# Patient Record
Sex: Female | Born: 1935 | Race: White | Hispanic: No | Smoking: Former smoker
Health system: Southern US, Community
[De-identification: ages and names within clinical notes are randomized; demographics above are authoritative.]

## PROBLEM LIST (undated history)

## (undated) DIAGNOSIS — I639 Cerebral infarction, unspecified: Secondary | ICD-10-CM

## (undated) DIAGNOSIS — C50919 Malignant neoplasm of unspecified site of unspecified female breast: Secondary | ICD-10-CM

## (undated) DIAGNOSIS — Z950 Presence of cardiac pacemaker: Secondary | ICD-10-CM

## (undated) DIAGNOSIS — E119 Type 2 diabetes mellitus without complications: Secondary | ICD-10-CM

## (undated) DIAGNOSIS — M810 Age-related osteoporosis without current pathological fracture: Secondary | ICD-10-CM

## (undated) DIAGNOSIS — E7800 Pure hypercholesterolemia, unspecified: Secondary | ICD-10-CM

## (undated) DIAGNOSIS — I10 Essential (primary) hypertension: Secondary | ICD-10-CM

## (undated) DIAGNOSIS — G459 Transient cerebral ischemic attack, unspecified: Secondary | ICD-10-CM

## (undated) DIAGNOSIS — M1990 Unspecified osteoarthritis, unspecified site: Secondary | ICD-10-CM

## (undated) DIAGNOSIS — J449 Chronic obstructive pulmonary disease, unspecified: Secondary | ICD-10-CM

## (undated) DIAGNOSIS — J479 Bronchiectasis, uncomplicated: Secondary | ICD-10-CM

## (undated) DIAGNOSIS — I890 Lymphedema, not elsewhere classified: Secondary | ICD-10-CM

## (undated) DIAGNOSIS — K2270 Barrett's esophagus without dysplasia: Secondary | ICD-10-CM

## (undated) DIAGNOSIS — J45909 Unspecified asthma, uncomplicated: Secondary | ICD-10-CM

## (undated) HISTORY — DX: Type 2 diabetes mellitus without complications: E11.9

## (undated) HISTORY — DX: Unspecified asthma, uncomplicated: J45.909

## (undated) HISTORY — DX: Essential (primary) hypertension: I10

## (undated) HISTORY — DX: Transient cerebral ischemic attack, unspecified: G45.9

## (undated) HISTORY — PX: GALLBLADDER SURGERY: SHX652

## (undated) HISTORY — PX: CATARACT EXTRACTION: SUR2

## (undated) HISTORY — DX: Cerebral infarction, unspecified: I63.9

## (undated) HISTORY — PX: CARPAL TUNNEL RELEASE: SHX101

## (undated) HISTORY — PX: TRIGGER FINGER RELEASE: SHX641

## (undated) HISTORY — DX: Lymphedema, not elsewhere classified: I89.0

## (undated) HISTORY — DX: Presence of cardiac pacemaker: Z95.0

---

## 1997-06-24 DIAGNOSIS — C50919 Malignant neoplasm of unspecified site of unspecified female breast: Secondary | ICD-10-CM

## 1997-06-24 HISTORY — DX: Malignant neoplasm of unspecified site of unspecified female breast: C50.919

## 1997-06-24 HISTORY — PX: MASTECTOMY: SHX3

## 1999-06-25 HISTORY — PX: TOTAL HIP ARTHROPLASTY: SHX124

## 2004-03-24 ENCOUNTER — Ambulatory Visit: Payer: Self-pay | Admitting: Oncology

## 2004-08-31 ENCOUNTER — Ambulatory Visit: Payer: Self-pay | Admitting: Oncology

## 2004-09-22 ENCOUNTER — Ambulatory Visit: Payer: Self-pay | Admitting: Oncology

## 2004-11-16 ENCOUNTER — Ambulatory Visit: Payer: Self-pay | Admitting: Internal Medicine

## 2005-02-18 ENCOUNTER — Ambulatory Visit: Payer: Self-pay | Admitting: Oncology

## 2005-02-27 ENCOUNTER — Ambulatory Visit: Payer: Self-pay | Admitting: Oncology

## 2005-03-24 ENCOUNTER — Ambulatory Visit: Payer: Self-pay | Admitting: Oncology

## 2005-05-26 ENCOUNTER — Inpatient Hospital Stay: Payer: Self-pay | Admitting: Internal Medicine

## 2005-06-07 ENCOUNTER — Inpatient Hospital Stay: Payer: Self-pay | Admitting: Internal Medicine

## 2005-06-09 ENCOUNTER — Other Ambulatory Visit: Payer: Self-pay

## 2005-07-31 ENCOUNTER — Encounter: Payer: Self-pay | Admitting: Internal Medicine

## 2005-08-22 ENCOUNTER — Encounter: Payer: Self-pay | Admitting: Internal Medicine

## 2005-08-26 ENCOUNTER — Ambulatory Visit: Payer: Self-pay | Admitting: Oncology

## 2005-09-22 ENCOUNTER — Encounter: Payer: Self-pay | Admitting: Internal Medicine

## 2005-09-22 ENCOUNTER — Ambulatory Visit: Payer: Self-pay | Admitting: Oncology

## 2005-10-22 ENCOUNTER — Encounter: Payer: Self-pay | Admitting: Internal Medicine

## 2005-11-22 ENCOUNTER — Encounter: Payer: Self-pay | Admitting: Internal Medicine

## 2005-12-22 ENCOUNTER — Encounter: Payer: Self-pay | Admitting: Internal Medicine

## 2006-02-19 ENCOUNTER — Ambulatory Visit: Payer: Self-pay | Admitting: Oncology

## 2006-03-03 ENCOUNTER — Ambulatory Visit: Payer: Self-pay | Admitting: Oncology

## 2006-03-24 ENCOUNTER — Ambulatory Visit: Payer: Self-pay | Admitting: Oncology

## 2006-03-25 ENCOUNTER — Ambulatory Visit: Payer: Self-pay | Admitting: Internal Medicine

## 2006-08-11 ENCOUNTER — Ambulatory Visit: Payer: Self-pay | Admitting: Internal Medicine

## 2006-08-23 ENCOUNTER — Ambulatory Visit: Payer: Self-pay | Admitting: Internal Medicine

## 2006-09-05 ENCOUNTER — Ambulatory Visit: Payer: Self-pay | Admitting: Oncology

## 2006-09-23 ENCOUNTER — Ambulatory Visit: Payer: Self-pay | Admitting: Oncology

## 2006-10-23 ENCOUNTER — Ambulatory Visit: Payer: Self-pay | Admitting: Oncology

## 2006-10-27 ENCOUNTER — Ambulatory Visit: Payer: Self-pay | Admitting: General Surgery

## 2006-10-27 ENCOUNTER — Other Ambulatory Visit: Payer: Self-pay

## 2006-10-29 ENCOUNTER — Ambulatory Visit: Payer: Self-pay | Admitting: General Surgery

## 2006-11-22 ENCOUNTER — Inpatient Hospital Stay: Payer: Self-pay | Admitting: Internal Medicine

## 2006-11-22 ENCOUNTER — Other Ambulatory Visit: Payer: Self-pay

## 2007-02-23 ENCOUNTER — Ambulatory Visit: Payer: Self-pay | Admitting: Oncology

## 2007-02-25 ENCOUNTER — Ambulatory Visit: Payer: Self-pay | Admitting: Oncology

## 2007-03-02 ENCOUNTER — Ambulatory Visit: Payer: Self-pay | Admitting: Oncology

## 2007-03-09 ENCOUNTER — Ambulatory Visit: Payer: Self-pay | Admitting: Cardiology

## 2007-03-25 ENCOUNTER — Ambulatory Visit: Payer: Self-pay | Admitting: Oncology

## 2007-07-27 ENCOUNTER — Ambulatory Visit: Payer: Self-pay | Admitting: Internal Medicine

## 2007-09-24 ENCOUNTER — Ambulatory Visit: Payer: Self-pay | Admitting: Oncology

## 2007-10-19 ENCOUNTER — Ambulatory Visit: Payer: Self-pay | Admitting: Unknown Physician Specialty

## 2007-10-19 ENCOUNTER — Other Ambulatory Visit: Payer: Self-pay

## 2007-10-21 ENCOUNTER — Ambulatory Visit: Payer: Self-pay | Admitting: Unknown Physician Specialty

## 2007-10-23 ENCOUNTER — Ambulatory Visit: Payer: Self-pay | Admitting: Oncology

## 2007-11-23 ENCOUNTER — Ambulatory Visit: Payer: Self-pay | Admitting: Oncology

## 2007-11-25 ENCOUNTER — Ambulatory Visit: Payer: Self-pay | Admitting: Oncology

## 2007-12-14 ENCOUNTER — Encounter: Payer: Self-pay | Admitting: Oncology

## 2007-12-23 ENCOUNTER — Ambulatory Visit: Payer: Self-pay | Admitting: Oncology

## 2007-12-23 ENCOUNTER — Encounter: Payer: Self-pay | Admitting: Oncology

## 2008-01-23 ENCOUNTER — Encounter: Payer: Self-pay | Admitting: Oncology

## 2008-01-23 ENCOUNTER — Ambulatory Visit: Payer: Self-pay | Admitting: Oncology

## 2008-02-08 ENCOUNTER — Ambulatory Visit: Payer: Self-pay | Admitting: Pain Medicine

## 2008-03-05 ENCOUNTER — Emergency Department: Payer: Self-pay | Admitting: Emergency Medicine

## 2008-03-05 ENCOUNTER — Other Ambulatory Visit: Payer: Self-pay

## 2008-03-07 ENCOUNTER — Ambulatory Visit: Payer: Self-pay | Admitting: Oncology

## 2008-03-24 ENCOUNTER — Ambulatory Visit: Payer: Self-pay | Admitting: Oncology

## 2008-03-25 ENCOUNTER — Ambulatory Visit: Payer: Self-pay | Admitting: Urology

## 2008-04-24 ENCOUNTER — Ambulatory Visit: Payer: Self-pay | Admitting: Oncology

## 2008-09-20 ENCOUNTER — Ambulatory Visit: Payer: Self-pay | Admitting: Unknown Physician Specialty

## 2008-09-22 ENCOUNTER — Ambulatory Visit: Payer: Self-pay | Admitting: Oncology

## 2008-09-27 ENCOUNTER — Ambulatory Visit: Payer: Self-pay | Admitting: General Practice

## 2008-10-05 ENCOUNTER — Ambulatory Visit: Payer: Self-pay | Admitting: Oncology

## 2008-10-14 ENCOUNTER — Encounter: Payer: Self-pay | Admitting: General Practice

## 2008-10-22 ENCOUNTER — Encounter: Payer: Self-pay | Admitting: General Practice

## 2008-10-22 ENCOUNTER — Ambulatory Visit: Payer: Self-pay | Admitting: Oncology

## 2008-11-22 ENCOUNTER — Encounter: Payer: Self-pay | Admitting: General Practice

## 2009-03-08 ENCOUNTER — Ambulatory Visit: Payer: Self-pay | Admitting: Oncology

## 2009-03-24 ENCOUNTER — Ambulatory Visit: Payer: Self-pay | Admitting: Oncology

## 2009-04-05 ENCOUNTER — Ambulatory Visit: Payer: Self-pay | Admitting: Oncology

## 2009-04-24 ENCOUNTER — Ambulatory Visit: Payer: Self-pay | Admitting: Oncology

## 2009-05-24 ENCOUNTER — Ambulatory Visit: Payer: Self-pay | Admitting: Oncology

## 2009-06-13 ENCOUNTER — Ambulatory Visit: Payer: Self-pay | Admitting: Internal Medicine

## 2009-11-22 ENCOUNTER — Ambulatory Visit: Payer: Self-pay | Admitting: Oncology

## 2009-12-15 ENCOUNTER — Ambulatory Visit: Payer: Self-pay | Admitting: Oncology

## 2009-12-22 ENCOUNTER — Ambulatory Visit: Payer: Self-pay | Admitting: Oncology

## 2010-01-28 ENCOUNTER — Emergency Department: Payer: Self-pay | Admitting: Emergency Medicine

## 2010-02-19 ENCOUNTER — Encounter: Payer: Self-pay | Admitting: Oncology

## 2010-02-28 ENCOUNTER — Encounter: Payer: Self-pay | Admitting: Oncology

## 2010-03-14 ENCOUNTER — Ambulatory Visit: Payer: Self-pay | Admitting: Oncology

## 2010-03-24 ENCOUNTER — Encounter: Payer: Self-pay | Admitting: Oncology

## 2010-04-10 ENCOUNTER — Ambulatory Visit: Payer: Self-pay | Admitting: Unknown Physician Specialty

## 2010-06-29 ENCOUNTER — Ambulatory Visit: Payer: Self-pay | Admitting: Oncology

## 2010-07-07 LAB — CANCER ANTIGEN 27.29: CA 27.29: 20.8 U/mL (ref 0.0–38.6)

## 2010-07-25 ENCOUNTER — Ambulatory Visit: Payer: Self-pay | Admitting: Oncology

## 2010-08-29 ENCOUNTER — Encounter: Payer: Self-pay | Admitting: Internal Medicine

## 2010-09-23 ENCOUNTER — Encounter: Payer: Self-pay | Admitting: Internal Medicine

## 2011-01-03 ENCOUNTER — Ambulatory Visit: Payer: Self-pay

## 2011-01-30 ENCOUNTER — Encounter: Payer: Self-pay | Admitting: Cardiothoracic Surgery

## 2011-01-30 ENCOUNTER — Encounter: Payer: Self-pay | Admitting: Nurse Practitioner

## 2011-03-18 ENCOUNTER — Ambulatory Visit: Payer: Self-pay | Admitting: Oncology

## 2011-06-07 ENCOUNTER — Ambulatory Visit: Payer: Self-pay | Admitting: Internal Medicine

## 2011-06-25 ENCOUNTER — Ambulatory Visit: Payer: Self-pay | Admitting: Internal Medicine

## 2011-07-01 ENCOUNTER — Ambulatory Visit: Payer: Self-pay | Admitting: Oncology

## 2011-07-01 LAB — COMPREHENSIVE METABOLIC PANEL
Alkaline Phosphatase: 160 U/L — ABNORMAL HIGH (ref 50–136)
Calcium, Total: 9.5 mg/dL (ref 8.5–10.1)
Chloride: 104 mmol/L (ref 98–107)
Co2: 30 mmol/L (ref 21–32)
Creatinine: 1.23 mg/dL (ref 0.60–1.30)
EGFR (African American): 55 — ABNORMAL LOW
EGFR (Non-African Amer.): 45 — ABNORMAL LOW
SGOT(AST): 23 U/L (ref 15–37)
SGPT (ALT): 29 U/L

## 2011-07-01 LAB — CBC CANCER CENTER
Basophil #: 0 x10 3/mm (ref 0.0–0.1)
Eosinophil #: 0 x10 3/mm (ref 0.0–0.7)
Eosinophil %: 1.6 %
Eosinophil: 2 %
HGB: 14.6 g/dL (ref 12.0–16.0)
Lymphocyte #: 0.5 x10 3/mm — ABNORMAL LOW (ref 1.0–3.6)
Lymphocytes: 18 %
MCHC: 34.2 g/dL (ref 32.0–36.0)
Monocyte #: 0.6 x10 3/mm (ref 0.0–0.7)
Monocytes: 20 %
Neutrophil %: 51 %
Platelet: 170 x10 3/mm (ref 150–440)
RBC: 4.96 10*6/uL (ref 3.80–5.20)
RDW: 13.9 % (ref 11.5–14.5)
Segmented Neutrophils: 60 %
WBC: 2.5 x10 3/mm — ABNORMAL LOW (ref 3.6–11.0)

## 2011-07-09 ENCOUNTER — Encounter: Payer: Self-pay | Admitting: Oncology

## 2011-07-15 LAB — CBC CANCER CENTER
Basophil #: 0 x10 3/mm (ref 0.0–0.1)
Basophil %: 0.2 %
Eosinophil #: 0.1 x10 3/mm (ref 0.0–0.7)
Eosinophil %: 2.7 %
HCT: 37.5 % (ref 35.0–47.0)
Lymphocyte #: 1.1 x10 3/mm (ref 1.0–3.6)
Lymphocyte %: 21.1 %
MCHC: 34.3 g/dL (ref 32.0–36.0)
MCV: 85 fL (ref 80–100)
Monocyte %: 8.6 %
Neutrophil #: 3.6 x10 3/mm (ref 1.4–6.5)
Platelet: 348 x10 3/mm (ref 150–440)
RDW: 13.6 % (ref 11.5–14.5)
WBC: 5.3 x10 3/mm (ref 3.6–11.0)

## 2011-07-26 ENCOUNTER — Encounter: Payer: Self-pay | Admitting: Oncology

## 2011-07-26 ENCOUNTER — Ambulatory Visit: Payer: Self-pay | Admitting: Oncology

## 2011-11-08 ENCOUNTER — Emergency Department: Payer: Self-pay | Admitting: Emergency Medicine

## 2011-11-08 LAB — COMPREHENSIVE METABOLIC PANEL
Albumin: 4 g/dL (ref 3.4–5.0)
Alkaline Phosphatase: 119 U/L (ref 50–136)
Anion Gap: 10 (ref 7–16)
BUN: 32 mg/dL — ABNORMAL HIGH (ref 7–18)
Bilirubin,Total: 0.5 mg/dL (ref 0.2–1.0)
Calcium, Total: 9.1 mg/dL (ref 8.5–10.1)
Chloride: 104 mmol/L (ref 98–107)
Creatinine: 1.23 mg/dL (ref 0.60–1.30)
EGFR (African American): 49 — ABNORMAL LOW
EGFR (Non-African Amer.): 43 — ABNORMAL LOW
Glucose: 117 mg/dL — ABNORMAL HIGH (ref 65–99)
Osmolality: 291 (ref 275–301)
SGOT(AST): 84 U/L — ABNORMAL HIGH (ref 15–37)
Sodium: 142 mmol/L (ref 136–145)
Total Protein: 7.6 g/dL (ref 6.4–8.2)

## 2011-11-08 LAB — URINALYSIS, COMPLETE
Bacteria: NONE SEEN
Bilirubin,UR: NEGATIVE
Glucose,UR: NEGATIVE mg/dL (ref 0–75)
Hyaline Cast: 4
Nitrite: NEGATIVE
Ph: 5 (ref 4.5–8.0)
RBC,UR: 1 /HPF (ref 0–5)
Specific Gravity: 1.014 (ref 1.003–1.030)
Transitional Epi: 2

## 2011-11-08 LAB — CBC
HCT: 42 % (ref 35.0–47.0)
HGB: 13.7 g/dL (ref 12.0–16.0)
MCV: 90 fL (ref 80–100)
WBC: 6.7 10*3/uL (ref 3.6–11.0)

## 2011-11-08 LAB — LIPASE, BLOOD: Lipase: 410 U/L — ABNORMAL HIGH (ref 73–393)

## 2011-11-09 LAB — TROPONIN I
Troponin-I: <0.02 ng/mL
Troponin-I: <0.02 ng/mL

## 2011-11-25 ENCOUNTER — Institutional Professional Consult (permissible substitution): Payer: Self-pay | Admitting: Pulmonary Disease

## 2011-12-16 ENCOUNTER — Ambulatory Visit: Payer: Self-pay | Admitting: Internal Medicine

## 2011-12-27 ENCOUNTER — Other Ambulatory Visit: Payer: Self-pay | Admitting: Physician Assistant

## 2011-12-27 DIAGNOSIS — M542 Cervicalgia: Secondary | ICD-10-CM

## 2011-12-31 ENCOUNTER — Encounter: Payer: Self-pay | Admitting: Pulmonary Disease

## 2011-12-31 ENCOUNTER — Ambulatory Visit (INDEPENDENT_AMBULATORY_CARE_PROVIDER_SITE_OTHER): Payer: Medicare Other | Admitting: Pulmonary Disease

## 2011-12-31 VITALS — BP 126/74 | HR 83 | Temp 98.2°F | Ht 60.0 in | Wt 293.0 lb

## 2011-12-31 DIAGNOSIS — K219 Gastro-esophageal reflux disease without esophagitis: Secondary | ICD-10-CM

## 2011-12-31 DIAGNOSIS — E119 Type 2 diabetes mellitus without complications: Secondary | ICD-10-CM

## 2011-12-31 DIAGNOSIS — J411 Mucopurulent chronic bronchitis: Secondary | ICD-10-CM

## 2011-12-31 DIAGNOSIS — R059 Cough, unspecified: Secondary | ICD-10-CM

## 2011-12-31 DIAGNOSIS — R05 Cough: Secondary | ICD-10-CM | POA: Insufficient documentation

## 2011-12-31 DIAGNOSIS — J449 Chronic obstructive pulmonary disease, unspecified: Secondary | ICD-10-CM

## 2011-12-31 DIAGNOSIS — J438 Other emphysema: Secondary | ICD-10-CM

## 2011-12-31 DIAGNOSIS — J4489 Other specified chronic obstructive pulmonary disease: Secondary | ICD-10-CM

## 2011-12-31 DIAGNOSIS — J439 Emphysema, unspecified: Secondary | ICD-10-CM

## 2011-12-31 DIAGNOSIS — I10 Essential (primary) hypertension: Secondary | ICD-10-CM

## 2011-12-31 NOTE — Assessment & Plan Note (Addendum)
Mrs. Stachnik does not have COPD based on simple spirometry performed today in clinic. She clearly has chronic bronchitis however as she has had mucopurulent sputum production for the last several months. It also sounds as if this has been a recurrent problem for her over the years as she has had multiple bouts of bronchitis and pneumonia. This is undoubtedly related to her smoking in the past.  She states that she had pertussis as a child which can lead to bronchiectasis. I question whether or not she could have this based on her description of copious sputum production on a daily basis. In order to make this diagnosis we would need to perform a CT scan. She would prefer to defer this test for now.  She is on maximum therapy for chronic bronchitis yet still has symptoms. In fact we could probably stop some of her inhalers. I believe that her cough is made worse by chronic sinus congestion and acid reflux so hopefully treating this will help.  Plan: -She is to continue taking her inhalers as written -I will obtain records from doctors [place] and [facility] -I have given her instructions for treatment of cough see that below -consider CT chest to evaluate for bronchiectasis

## 2011-12-31 NOTE — Patient Instructions (Addendum)
We will see you back in 3 months or sooner if needed. We will get records from Dr. Lillette Boxer office. Stop Flovent Continue taking Advair, Spiriva as written.  Use Neil Med rinses with distilled water at least twice per day using the instructions on the package. 1/2 hour after using the [facility] Med rinse, use Nasonex two puffs in each nostril once per day. Use chlortrimeton and an over the counter decongestant (ask the pharmacist for a recommendation) as needed for the cough.  You can also use delsym as needed for cough.  Follow the reflux lifestyle changes we recommended.  Increase prilosec to 40mg  twice a day.

## 2011-12-31 NOTE — Assessment & Plan Note (Signed)
She has symptoms consistent with sinus congestion and acid reflux which are likely contributing to her cough.  Plan: -Start Lloyd Huger med rinses over-the-counter Chlor-Trimeton, and over-the-counter decongestants -Double dose of PPI therapy -Follow gastric reflux lifestyle changes

## 2011-12-31 NOTE — Assessment & Plan Note (Signed)
-  Increase PPI to 40 mg twice a day -Follow GERD lifestyle modification

## 2011-12-31 NOTE — Progress Notes (Signed)
Subjective:    Patient ID: Teresa Krueger, female    DOB: October 14, 1933, 78 y.o.   MRN: [id number]  HPI  12/31/11 First office visit: Teresa Krueger is a very pleasant 78 year old female who is referred to [place] for evaluation of chronic bronchitis and cough. She was healthy as a young child though she states she had pertussis at some point in childhood. However as a child and young adult she had no respiratory symptoms. She has had 3-4 episodes of pneumonia and episodes of bronchitis over the years. For the last 10 years she has been treated with inhaler therapy but she does not know why. It least 10 years ago she was referred to Dr. Priscille Heidelberg and was told that she had asthma. She states that she does not have frequent bouts of wheezing chest pain or chest tightness with shortness of breath. Her primary symptoms over the last several years have been dyspnea on exertion. She states that she gets short of breath with minimal activity including walking from one room to the next or showering. She does not exercise on a regular basis do to her weight and arthritis. She has not had a chronic cough until January of 2013. At that point she developed pneumonia and has had a cough productive of purulent sputum since. A chest x-ray performed after several weeks of antibiotics was normal. Despite this the sputum production has persisted. It is often yellow or even green. She denies weight loss or ongoing fevers. Her shortness of breath has not changed and she does not wheeze. She states that the cough is not worse when she lies flat or when she wakes up in the morning. She does think that the cough may be worse after she eat dinner in the evenings. She denies gastroesophageal reflux disease symptoms however she has been told that she has this in the past. She states that she has chronic sinus congestion but does not think that she has a postnasal drip. She takes Spiriva Advair and albuterol but does not believe that any of  these help with her symptoms.  . Past Medical History  Diagnosis Date  . Mini stroke   . Asthma   . Diabetes mellitus   . Hypertension   . Breast cancer, left   . Lymphedema      Family History  Problem Relation Age of Onset  . Heart disease Father   . Heart disease Mother   . Heart disease Maternal Grandmother   . Heart disease Paternal Grandmother   . Stroke Maternal Aunt   . Breast cancer Paternal Aunt      History   Social History  . Marital Status: Married    Spouse Name: N/A    Number of Children: 1  . Years of Education: N/A   Occupational History  . 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 Smoking status: Former Smoker -- 1.0 packs/day for 30 years    Types: Cigarettes    Quit date: 12/27/1992  . Smokeless tobacco: Never Used  . Alcohol Use: No  . Drug Use: No  . Sexually Active: Not on file   Other Topics Concern  . Not on file   Social History Narrative  . No narrative on file     Allergies  Allergen Reactions  . Celebrex (Celecoxib) Anaphylaxis  . Nsaids Anaphylaxis  . Rofecoxib Anaphylaxis  . Codeine Nausea And Vomiting  syncope  . Hydrocodone     unknown  . Morphine And Related     unknown  . Penicillins Swelling  . Phenergan (Promethazine Hcl)     unknown  . Propoxyphene And Methadone     unknown  . Quinolones     unknown  . Sulfa Antibiotics Itching     No outpatient prescriptions prior to visit.    Review of Systems  Constitutional: Negative for fever, chills and unexpected weight change.  HENT: Positive for congestion and voice change. Negative for ear pain, nosebleeds, sore throat, rhinorrhea, sneezing, trouble swallowing, dental problem, postnasal drip and sinus pressure.   Eyes: Negative for visual disturbance.  Respiratory: Positive for cough and shortness of breath. Negative for choking.   Cardiovascular: Positive for leg swelling. Negative for chest pain.  Gastrointestinal:  Negative for vomiting, abdominal pain and diarrhea.  Genitourinary: Negative for difficulty urinating.  Musculoskeletal: Positive for arthralgias.  Skin: Negative for rash.  Neurological: Negative for tremors, syncope and headaches.  Hematological: Does not bruise/bleed easily.       Objective:   Physical Exam Filed Vitals:   12/31/11 1513  BP: 126/74  Pulse: 83  Temp: 98.2 F (36.8 C)  TempSrc: Oral  Height: 5' (1.524 m)  Weight: 293 lb (132.904 kg)  SpO2: 95%   Gen: morbidly obese, wheelchair bound, no acute distress HEENT: NCAT, PERRL, EOMi, OP clear, neck supple without masses PULM: CTA B CV: RRR, systolic murmur RUSB, no JVD AB: BS+, soft, nontender, no hsm Ext: warm, significant non-pitting edema bilaterally, no clubbing, no cyanosis Derm: no rash or skin breakdown Neuro: A&Ox4, CN II-XII intact, strength 5/5 in all 4 extremities  12/31/2011 Simple spirometry normal 07/27/2011 CXR question emphysema/hyperinflation     Assessment & Plan:   Chronic bronchitis Mrs. Abeln does not have COPD based on simple spirometry performed today in clinic. She clearly has chronic bronchitis however as she has had mucopurulent sputum production for the last several months. It also sounds as if this has been a recurrent problem for her over the years as she has had multiple bouts of bronchitis and pneumonia. This is undoubtedly related to her smoking in the past.  She states that she had pertussis as a child which can lead to bronchiectasis. I question whether or not she could have this based on her description of copious sputum production on a daily basis. In order to make this diagnosis we would need to perform a CT scan. She would prefer to defer this test for now.  She is on maximum therapy for chronic bronchitis yet still has symptoms. In fact we could probably stop some of her inhalers. I believe that her cough is made worse by chronic sinus congestion and acid reflux so hopefully  treating this will help.  Plan: -She is to continue taking her inhalers as written -I will obtain records from doctors [facility] and [facility] -I have given her instructions for treatment of cough see that below  Cough She has symptoms consistent with sinus congestion and acid reflux which are likely contributing to her cough.  Plan: -Start Lloyd Huger med rinses over-the-counter Chlor-Trimeton, and over-the-counter decongestants -Double dose of PPI therapy -Follow gastric reflux lifestyle changes  GERD (gastroesophageal reflux disease) -Increase PPI to 40 mg twice a day -Follow GERD lifestyle modification   Updated Medication List Outpatient Encounter Prescriptions as of 12/31/2011  Medication Sig Dispense Refill  . acetaminophen (TYLENOL) 325 MG tablet Take 650 mg by mouth every 6 (six) hours  as needed.      Marland Kitchen albuterol (PROAIR HFA) 108 (90 BASE) MCG/ACT inhaler Inhale 2 puffs into the lungs every 6 (six) hours as needed.      . ALPRAZolam (XANAX) 0.5 MG tablet Take 0.5 mg by mouth 2 (two) times daily as needed.      . bumetanide (BUMEX) 1 MG tablet Take 1 mg by mouth daily.      . calcium-vitamin D (OSCAL WITH D) 500-200 MG-UNIT per tablet Take 1 tablet by mouth daily.      . fluticasone (FLOVENT HFA) 220 MCG/ACT inhaler Inhale 1 puff into the lungs daily.      . Fluticasone-Salmeterol (ADVAIR) 250-50 MCG/DOSE AEPB Inhale 1 puff into the lungs every 12 (twelve) hours.      . insulin lispro protamine-insulin lispro (HUMALOG 75/25) (75-25) 100 UNIT/ML SUSP Take as directed      . losartan (COZAAR) 25 MG tablet Take 25 mg by mouth daily.      . Multiple Vitamin (MULTIVITAMIN) capsule Take 1 capsule by mouth daily.      Marland Kitchen omeprazole (PRILOSEC) 20 MG capsule Take 20 mg by mouth 2 (two) times daily.      . simethicone (MYLICON) 125 MG chewable tablet Chew 125 mg by mouth every 6 (six) hours as needed.      . simvastatin (ZOCOR) 20 MG tablet Take 20 mg by mouth every evening.      Marland Kitchen spironolactone  (ALDACTONE) 25 MG tablet Take 25 mg by mouth daily.

## 2012-01-03 ENCOUNTER — Encounter: Payer: Self-pay | Admitting: *Deleted

## 2012-01-03 ENCOUNTER — Telehealth: Payer: Self-pay | Admitting: Pulmonary Disease

## 2012-01-03 NOTE — Telephone Encounter (Signed)
noted 

## 2012-01-03 NOTE — Telephone Encounter (Signed)
Will forward to Dr. Kendrick Fries as an [place]ain Childes and this has been added to pt family history.

## 2012-01-06 ENCOUNTER — Ambulatory Visit
Admission: RE | Admit: 2012-01-06 | Discharge: 2012-01-06 | Disposition: A | Discharge status: Home / Self Care | Payer: Medicare Other | Source: Ambulatory Visit | Attending: Physician Assistant | Admitting: Physician Assistant

## 2012-01-06 DIAGNOSIS — M542 Cervicalgia: Secondary | ICD-10-CM

## 2012-01-20 ENCOUNTER — Telehealth: Payer: Self-pay | Admitting: Pulmonary Disease

## 2012-01-20 MED ORDER — OMEPRAZOLE 20 MG PO CPDR: 20.0000 mg | DELAYED_RELEASE_CAPSULE | Freq: Two times a day (BID) | ORAL | Status: DC

## 2012-01-20 NOTE — Telephone Encounter (Signed)
Rx for omeprazole was refilled. Pt aware and states nothing further needed.

## 2012-02-04 ENCOUNTER — Telehealth: Payer: Self-pay | Admitting: Pulmonary Disease

## 2012-02-04 NOTE — Telephone Encounter (Signed)
Caller: Adra/Patient; Patient Name: Teresa Krueger; PCP: Duncan Dull; Best Callback Phone Number: [phone] [name] is calling about having Gas pains- upper stomach hurts.  She has frequent dry cough, chest feels tight- chronic Bronchits and takes Delsym and thinks it is bothering her stomach.  Mouth feels dry. Abdomen bloated and feeling nauseous. Appetite Diminished. Blood Glucose= this morning.  It has been running higher even though not eating as much. Last bm was today loose d/t taking MOM last night. Awaiting test result to see if blood in stool. Triage per Diabetes:Gastrointestinal Problems Protocol and appnt advised within 24 hours for "Generalized bloating, distension or swelling". Appnt scheduled with Dr. Darrick Huntsman on 02/05/12 @ 1615.

## 2012-02-05 ENCOUNTER — Ambulatory Visit: Payer: Medicare Other | Admitting: Internal Medicine

## 2012-02-10 ENCOUNTER — Ambulatory Visit: Payer: Self-pay | Admitting: Unknown Physician Specialty

## 2012-02-11 ENCOUNTER — Telehealth: Payer: Self-pay | Admitting: Pulmonary Disease

## 2012-02-11 NOTE — Telephone Encounter (Signed)
Spoke with pt and advised will leave samples up front for pick up at the [place] office on Monday 02/17/12. Ensured pt has enough meds to last until then. Pt states nothing further needed.  

## 2012-02-12 LAB — PATHOLOGY REPORT

## 2012-03-18 ENCOUNTER — Ambulatory Visit: Payer: Self-pay | Admitting: Internal Medicine

## 2012-03-18 ENCOUNTER — Ambulatory Visit: Payer: Self-pay | Admitting: Oncology

## 2012-03-18 LAB — CBC CANCER CENTER
Basophil #: 0 x10 3/mm (ref 0.0–0.1)
Basophil %: 0.7 %
Eosinophil #: 0.2 x10 3/mm (ref 0.0–0.7)
HCT: 43.1 % (ref 35.0–47.0)
HGB: 13.9 g/dL (ref 12.0–16.0)
Lymphocyte #: 1.2 x10 3/mm (ref 1.0–3.6)
Lymphocyte %: 22.8 %
MCH: 28.6 pg (ref 26.0–34.0)
MCHC: 32.3 g/dL (ref 32.0–36.0)
Monocyte #: 0.5 x10 3/mm (ref 0.2–0.9)
Neutrophil #: 3.3 x10 3/mm (ref 1.4–6.5)
Platelet: 192 x10 3/mm (ref 150–440)
RBC: 4.87 10*6/uL (ref 3.80–5.20)
RDW: 13.8 % (ref 11.5–14.5)

## 2012-03-18 LAB — COMPREHENSIVE METABOLIC PANEL
Albumin: 3.9 g/dL (ref 3.4–5.0)
Bilirubin,Total: 0.4 mg/dL (ref 0.2–1.0)
Calcium, Total: 9.4 mg/dL (ref 8.5–10.1)
Chloride: 104 mmol/L (ref 98–107)
Co2: 29 mmol/L (ref 21–32)
Glucose: 64 mg/dL — ABNORMAL LOW (ref 65–99)
Potassium: 4.9 mmol/L (ref 3.5–5.1)
SGPT (ALT): 30 U/L (ref 12–78)
Sodium: 141 mmol/L (ref 136–145)
Total Protein: 7.8 g/dL (ref 6.4–8.2)

## 2012-03-24 ENCOUNTER — Ambulatory Visit: Payer: Self-pay | Admitting: Internal Medicine

## 2012-03-25 ENCOUNTER — Ambulatory Visit: Payer: Medicare Other | Attending: Internal Medicine | Admitting: Physical Therapy

## 2012-03-25 DIAGNOSIS — IMO0001 Reserved for inherently not codable concepts without codable children: Secondary | ICD-10-CM | POA: Insufficient documentation

## 2012-03-25 DIAGNOSIS — M79609 Pain in unspecified limb: Secondary | ICD-10-CM | POA: Insufficient documentation

## 2012-03-25 DIAGNOSIS — I890 Lymphedema, not elsewhere classified: Secondary | ICD-10-CM | POA: Insufficient documentation

## 2012-03-26 ENCOUNTER — Ambulatory Visit (INDEPENDENT_AMBULATORY_CARE_PROVIDER_SITE_OTHER): Payer: Medicare Other | Admitting: Pulmonary Disease

## 2012-03-26 ENCOUNTER — Ambulatory Visit (INDEPENDENT_AMBULATORY_CARE_PROVIDER_SITE_OTHER)
Admission: RE | Admit: 2012-03-26 | Discharge: 2012-03-26 | Disposition: A | Discharge status: Home / Self Care | Payer: Medicare Other | Source: Ambulatory Visit | Attending: Pulmonary Disease | Admitting: Pulmonary Disease

## 2012-03-26 ENCOUNTER — Encounter: Payer: Self-pay | Admitting: Pulmonary Disease

## 2012-03-26 VITALS — BP 124/60 | HR 85 | Temp 98.0°F | Ht 60.0 in | Wt 287.0 lb

## 2012-03-26 DIAGNOSIS — R05 Cough: Secondary | ICD-10-CM

## 2012-03-26 DIAGNOSIS — J411 Mucopurulent chronic bronchitis: Secondary | ICD-10-CM

## 2012-03-26 DIAGNOSIS — R059 Cough, unspecified: Secondary | ICD-10-CM

## 2012-03-26 MED ORDER — DOXYCYCLINE HYCLATE 50 MG PO CAPS: 100.0000 mg | ORAL_CAPSULE | Freq: Two times a day (BID) | ORAL | Status: DC

## 2012-03-26 NOTE — Addendum Note (Signed)
Addended by: Christen Butter on: 03/26/2012 03:15 PM   Modules accepted: Orders

## 2012-03-26 NOTE — Progress Notes (Signed)
Subjective:    Patient ID: Teresa Krueger, female    DOB: April 04, 1934, 78 y.o.   MRN: [id number]  Synopsis: Teresa Krueger first saw the [facility] pulmonary clinic in July 2013 for cough. She has a one pack per day smoking history x30 years and quit in the 1990s. She had simple spirometry in July 2013 which showed no evidence of COPD. She has a history of childhood pertussis but a 2010 CT scan of her chest showed no evidence of bronchiectasis. She has postnasal drip and gastroesophageal reflux disease with Barrett's esophagus.  HPI  10/3 ROV -- Teresa Krueger says that she's had a very productive cough lately has been quite frequent. After she saw the last time she started taking Mucinex and Chlor-Trimeton. She says that the Chlor-Trimeton actually worked very well to make her cough to a but she did note some dryness in her nose with that. She was able to tolerate the dryness from the Chlor-Trimeton that when she use Mucinex she said that made her feel "way too dry". She's not had fevers chills or increasing shortness of breath lately. She had an EGD performed by her gastroenterologist recently which showed Barrett's esophagus.  Past Medical History  Diagnosis Date  . Mini stroke   . Asthma   . Diabetes mellitus   . Hypertension   . Breast cancer, left   . Lymphedema     Review of Systems  Constitutional: Negative for fever, chills and diaphoresis.  HENT: Positive for congestion, rhinorrhea and postnasal drip.   Respiratory: Positive for cough. Negative for chest tightness, shortness of breath and stridor.   Cardiovascular: Negative for chest pain and leg swelling.       Objective:   Physical Exam  Filed Vitals:   03/26/12 1346  BP: 124/60  Pulse: 85  Temp: 98 F (36.7 C)  TempSrc: Oral  Height: 5' (1.524 m)  Weight: 287 lb (130.182 kg)  SpO2: 95%   Gen: morbidly obese, wheelchair bound HEENT: NCAT, PERRL, EOMi, OP clear, neck supple without masses PULM: rhonchi LUL,  otherwise CTA CV: RRR, no mgr, no JVD AB: BS+, soft, nontender, no hsm Ext: warm, trace edema, no clubbing, no cyanosis      Assessment & Plan:   Chronic bronchitis Teresa Krueger still continues to struggle with her cough. Today on exam she's had notable rhonchi is been struggling with a cough for several weeks set think it's likely a flare of her chronic bronchitis.  Plan: -Check chest x-ray to rule out pneumonia -Doxycycline 100 mg by mouth twice a day x7 days -Add flutter valve  Cough As noted above I think the main cause of Teresa Krueger chronic cough is chronic bronchitis from years of smoking. I reviewed a CT scan that been performed in 2010 and saw no evidence of bronchiectasis despite her prior history of pertussis.  She improved while using Chlor-Trimeton so I think it's very likely that she has a strong component of postnasal drip contributing to her cough. She's also has Barrett's esophagus so undoubtedly there some component of reflux contributing as well.  A big challenge treating her cough is starting a medication regimen for her that she thinks doesn't ever side effects. She actually did well with Chlor-Trimeton but stopped it because she thinks it made her sinuses to dry.  Plan: -As noted above retrograde doxycycline for 7 day course -Encourage her to use her flutter valve a regular basis to help loosen the secretions -Continue treatment for postnasal drip with Chlor-Trimeton  and saline sprays. -Continue treatment of her gastroesophageal reflux disease as instructed by her gastroenterologist.   Updated Medication List Outpatient Encounter Prescriptions as of 03/26/2012  Medication Sig Dispense Refill  . acetaminophen (TYLENOL) 325 MG tablet Take 650 mg by mouth every 6 (six) hours as needed.      Marland Kitchen albuterol (PROAIR HFA) 108 (90 BASE) MCG/ACT inhaler Inhale 2 puffs into the lungs every 6 (six) hours as needed.      . ALPRAZolam (XANAX) 0.5 MG tablet Take 0.5 mg by mouth 2 (two)  times daily as needed.      . bumetanide (BUMEX) 1 MG tablet Take 1 mg by mouth daily.      . calcium-vitamin D (OSCAL WITH D) 500-200 MG-UNIT per tablet Take 1 tablet by mouth daily.      . chlorpheniramine (CHLORPHEN) 4 MG tablet Take 4 mg by mouth 2 (two) times daily as needed.      Marland Kitchen dextromethorphan (DELSYM) 30 MG/5ML liquid Take 60 mg by mouth as needed.      . Fluticasone-Salmeterol (ADVAIR) 250-50 MCG/DOSE AEPB Inhale 1 puff into the lungs every 12 (twelve) hours.      . insulin lispro protamine-insulin lispro (HUMALOG 75/25) (75-25) 100 UNIT/ML SUSP Take as directed      . losartan (COZAAR) 25 MG tablet Take 25 mg by mouth daily.      . mometasone (NASONEX) 50 MCG/ACT nasal spray Place 2 sprays into the nose daily.      . Multiple Vitamin (MULTIVITAMIN) capsule Take 1 capsule by mouth daily.      Marland Kitchen omeprazole (PRILOSEC) 20 MG capsule Take 1 capsule (20 mg total) by mouth 2 (two) times daily.  60 capsule  5  . potassium chloride SA (K-DUR,KLOR-CON) 20 MEQ tablet Take 20 mEq by mouth 3 (three) times daily.      . simethicone (MYLICON) 125 MG chewable tablet Chew 125 mg by mouth every 6 (six) hours as needed.      . simvastatin (ZOCOR) 20 MG tablet Take 20 mg by mouth every evening.      Marland Kitchen spironolactone (ALDACTONE) 25 MG tablet Take 25 mg by mouth daily.      Marland Kitchen tiotropium (SPIRIVA HANDIHALER) 18 MCG inhalation capsule Place 18 mcg into inhaler and inhale daily.      Marland Kitchen doxycycline (VIBRAMYCIN) 50 MG capsule Take 2 capsules (100 mg total) by mouth 2 (two) times daily.  14 capsule  0  . DISCONTD: fluticasone (FLOVENT HFA) 220 MCG/ACT inhaler Inhale 1 puff into the lungs daily.

## 2012-03-26 NOTE — Assessment & Plan Note (Signed)
Teresa Krueger still continues to struggle with her cough. Today on exam she's had notable rhonchi is been struggling with a cough for several weeks set think it's likely a flare of her chronic bronchitis.  Plan: -Check chest x-ray to rule out pneumonia -Doxycycline 100 mg by mouth twice a day x7 days -Add flutter valve

## 2012-03-26 NOTE — Patient Instructions (Signed)
Take the doxycycline 100mg  po twice a day for 7 days Get a CXR today, we will call you to see how it is doing Follow the Reflux diet we gave you Follow your GI doctor's recommendations Use the flutter valve ten breaths at least three to four times per day Use the chlortrimeton to help with the cough We will see you back in 3-4 months or sooner if needed

## 2012-03-26 NOTE — Assessment & Plan Note (Signed)
As noted above I think the main cause of Teresa Krueger chronic cough is chronic bronchitis from years of smoking. I reviewed a CT scan that been performed in 2010 and saw no evidence of bronchiectasis despite her prior history of pertussis.  She improved while using Chlor-Trimeton so I think it's very likely that she has a strong component of postnasal drip contributing to her cough. She's also has Barrett's esophagus so undoubtedly there some component of reflux contributing as well.  A big challenge treating her cough is starting a medication regimen for her that she thinks doesn't ever side effects. She actually did well with Chlor-Trimeton but stopped it because she thinks it made her sinuses to dry.  Plan: -As noted above retrograde doxycycline for 7 day course -Encourage her to use her flutter valve a regular basis to help loosen the secretions -Continue treatment for postnasal drip with Chlor-Trimeton and saline sprays. -Continue treatment of her gastroesophageal reflux disease as instructed by her gastroenterologist.

## 2012-03-27 ENCOUNTER — Telehealth: Payer: Self-pay | Admitting: Pulmonary Disease

## 2012-03-27 ENCOUNTER — Telehealth: Payer: Self-pay | Admitting: Internal Medicine

## 2012-03-27 NOTE — Progress Notes (Signed)
Quick Note:  Spoke with pt and notified of results per Dr. Wert. Pt verbalized understanding and denied any questions.  ______ 

## 2012-03-27 NOTE — Telephone Encounter (Signed)
Not a [facility] pt. Sent to Pam at [facility] for further instruction.

## 2012-03-27 NOTE — Telephone Encounter (Signed)
Pt. Saw Dr. Kendrick Fries at the [facility] office yesterday.  He wanted to know when her last Pneumovax was given.  Last received:  06/13/2011 Pt. Is also looking for her CXR results.  PLEASE CONTACT PT. WITH CXR RESULTS.   [facility]/db.

## 2012-03-27 NOTE — Telephone Encounter (Signed)
Leslie has already spoken with pt. Will sign off message 

## 2012-04-06 LAB — CREATININE, SERUM
Creatinine: 1.4 mg/dL — ABNORMAL HIGH (ref 0.60–1.30)
EGFR (African American): 42 — ABNORMAL LOW
EGFR (Non-African Amer.): 36 — ABNORMAL LOW

## 2012-04-09 ENCOUNTER — Telehealth: Payer: Self-pay | Admitting: Pulmonary Disease

## 2012-04-09 MED ORDER — DOXYCYCLINE HYCLATE 50 MG PO CAPS: 100.0000 mg | ORAL_CAPSULE | Freq: Two times a day (BID) | ORAL | Status: DC

## 2012-04-09 NOTE — Telephone Encounter (Signed)
Noted, thanks!

## 2012-04-09 NOTE — Telephone Encounter (Signed)
Per CY-okay to give another Rx for Doxycycline 50 mg #14 take 2 capsules bid no refills;pt is aware and requests we send to [facility] in [place].   Will sign off on message and send to BQ as FYI about his patient.

## 2012-04-09 NOTE — Telephone Encounter (Signed)
Patient is still experiencing symptoms with her Chronic Bronchitis--prod cough Last Thursday 10/10 completed course of Doxy 50mg .  Last night started having productive cough again with creamy-thick yellowish mucus. Patient thinks that she may not need it but she wants to have another round just to be safe since leaving for vacation. Patient leaving vacation tomorrow (friday 04-10-12) x 1 week. Last given Doxy 50mg  #14  Take 2 capsules bid  0RF  Please advise Dr Maple Hudson. Thanks.   Allergies  Allergen Reactions  . Celebrex (Celecoxib) Anaphylaxis  . Nsaids Anaphylaxis  . Rofecoxib Anaphylaxis  . Codeine Nausea And Vomiting    syncope  . Hydrocodone     unknown  . Morphine And Related     unknown  . Penicillins Swelling  . Phenergan (Promethazine Hcl)     unknown  . Propoxyphene And Methadone     unknown  . Quinolones     unknown  . Sulfa Antibiotics Itching  . Tramadol Hcl Nausea Only

## 2012-04-20 ENCOUNTER — Ambulatory Visit: Payer: Medicare Other

## 2012-04-22 ENCOUNTER — Ambulatory Visit: Payer: Medicare Other

## 2012-04-23 ENCOUNTER — Other Ambulatory Visit: Payer: Self-pay | Admitting: Pulmonary Disease

## 2012-04-23 NOTE — Telephone Encounter (Signed)
???   Are there no refills left on their prescriptions?

## 2012-04-23 NOTE — Telephone Encounter (Signed)
Caller: Harjit/Patient; Patient Name: Teresa Krueger; PCP: Duncan Dull (Adults only); Best Callback Phone Number: [phone]. 04/23/12 - Patient needs Spiriva and Advair samples for both her and her husband Calista Crain (DOB-06/11/1933). Patient states that her husband is out of both of them and she only has a few "puffs" left on hers. She would like to get them as soon as possible since she did not know they were completely out. Note sent to office vis [facility] requesting the samples.

## 2012-04-24 ENCOUNTER — Ambulatory Visit: Payer: Self-pay | Admitting: Internal Medicine

## 2012-04-24 ENCOUNTER — Telehealth: Payer: Self-pay | Admitting: Pulmonary Disease

## 2012-04-24 ENCOUNTER — Ambulatory Visit: Payer: Medicare Other | Attending: Internal Medicine | Admitting: Physical Therapy

## 2012-04-24 DIAGNOSIS — I890 Lymphedema, not elsewhere classified: Secondary | ICD-10-CM | POA: Insufficient documentation

## 2012-04-24 DIAGNOSIS — M79609 Pain in unspecified limb: Secondary | ICD-10-CM | POA: Insufficient documentation

## 2012-04-24 DIAGNOSIS — IMO0001 Reserved for inherently not codable concepts without codable children: Secondary | ICD-10-CM | POA: Insufficient documentation

## 2012-04-24 MED ORDER — FLUTICASONE-SALMETEROL 250-50 MCG/DOSE IN AEPB: 1.0000 | INHALATION_SPRAY | Freq: Two times a day (BID) | RESPIRATORY_TRACT | Status: DC

## 2012-04-24 MED ORDER — TIOTROPIUM BROMIDE MONOHYDRATE 18 MCG IN CAPS: 18.0000 ug | ORAL_CAPSULE | Freq: Every day | RESPIRATORY_TRACT | Status: DC

## 2012-04-24 NOTE — Telephone Encounter (Signed)
Rxs were both sent to pharm. Pt aware and states nothing further needed.

## 2012-04-24 NOTE — Telephone Encounter (Signed)
Patient needing samples of Adv air 250/50 mg and Spiriva  °

## 2012-04-24 NOTE — Telephone Encounter (Signed)
No samples available, pt aware 

## 2012-04-27 ENCOUNTER — Ambulatory Visit: Payer: Medicare Other

## 2012-04-29 ENCOUNTER — Telehealth: Payer: Self-pay | Admitting: Pulmonary Disease

## 2012-04-29 ENCOUNTER — Ambulatory Visit: Payer: Medicare Other

## 2012-04-29 NOTE — Telephone Encounter (Signed)
Pt last seen by BQ 10.3.13, follow up in 4 months.  We do have sample of the advair 250-50 for pt, but BQ is not back in [place] until next week.  Does she want to come here to pick this up?  LMOM TCB x1.

## 2012-04-30 NOTE — Telephone Encounter (Signed)
Pt returned call. She says since she has an appt with dr Kendrick Fries tues 05-05-12 she will wait and get samples at his office. Please make sure they have samples at [facility] office tues. Nothing further needed per pt. Hazel Sams

## 2012-04-30 NOTE — Telephone Encounter (Signed)
LMOMTCB x 1 for the pt. 

## 2012-04-30 NOTE — Telephone Encounter (Signed)
Will forward to [place] so she is aware.

## 2012-05-04 ENCOUNTER — Ambulatory Visit: Payer: Medicare Other

## 2012-05-05 ENCOUNTER — Encounter: Payer: Self-pay | Admitting: Pulmonary Disease

## 2012-05-05 ENCOUNTER — Ambulatory Visit (INDEPENDENT_AMBULATORY_CARE_PROVIDER_SITE_OTHER): Payer: Medicare Other | Admitting: Pulmonary Disease

## 2012-05-05 ENCOUNTER — Telehealth: Payer: Self-pay | Admitting: Pulmonary Disease

## 2012-05-05 VITALS — BP 108/62 | HR 80 | Temp 97.7°F | Ht 60.0 in | Wt 293.0 lb

## 2012-05-05 DIAGNOSIS — R059 Cough, unspecified: Secondary | ICD-10-CM

## 2012-05-05 DIAGNOSIS — R05 Cough: Secondary | ICD-10-CM

## 2012-05-05 DIAGNOSIS — J411 Mucopurulent chronic bronchitis: Secondary | ICD-10-CM

## 2012-05-05 MED ORDER — PREDNISONE 10 MG PO TABS: ORAL_TABLET | ORAL | Status: DC

## 2012-05-05 MED ORDER — AMOXICILLIN 500 MG PO CAPS: 500.0000 mg | ORAL_CAPSULE | Freq: Three times a day (TID) | ORAL | Status: AC

## 2012-05-05 NOTE — Patient Instructions (Signed)
We will send you for a CT scan of your sinuses and your chest to look for other causes of your cough Give [place] three sputum specimens to look for mycobacterium avium complex infection We will obtain records from Dr. Mikey Bussing office  We will start amoxicillin and prednisone for your cough  Keep taking your other medications as written  We will see you back in 2 weeks

## 2012-05-05 NOTE — Progress Notes (Signed)
Subjective:    Patient ID: Teresa Krueger, female    DOB: [birth year], 78 y.o.   MRN: [id number]  Synopsis: parneet glantz first saw the [facility] pulmonary clinic in July 2013 for cough. She has a one pack per day smoking history x30 years and quit in the 1990s. She had simple spirometry in July 2013 which showed no evidence of COPD. She has a history of childhood pertussis but a 2010 CT scan of her chest showed no evidence of bronchiectasis. She has postnasal drip and gastroesophageal reflux disease with Barrett's esophagus.  HPI   10/3 ROV -- See says that she's had a very productive cough lately has been quite frequent. After she saw the last time she started taking Mucinex and Chlor-Trimeton. She says that the Chlor-Trimeton actually worked very well to make her cough to a but she did note some dryness in her nose with that. She was able to tolerate the dryness from the Chlor-Trimeton that when she use Mucinex she said that made her feel "way too dry". She's not had fevers chills or increasing shortness of breath lately. She had an EGD performed by her gastroenterologist recently which showed Barrett's esophagus.  05/05/12 ROV -- Unfortunately Kailia is just not getting any better.  Since our last visit she has had two courses of doxycycline yet her cough persists.  She is producing thick yellow sputum.  The doxycycline helped both times but the symptoms are persisting.  She is using her inhalers, flutter valve, and saline therapy as prescribed but she doesn't like the way the saline rinses make her feel ("too dry").  She says that the chlortrimeton helps.  She is not having fevers or weight loss.  She has been wheezing more lately.  Delsym bothered her stomach.   Past Medical History  Diagnosis Date  . Mini stroke   . Asthma   . Diabetes mellitus   . Hypertension   . Breast cancer, left   . Lymphedema     Review of Systems  Constitutional: Negative for fever, chills and  diaphoresis.  HENT: Positive for congestion, rhinorrhea and postnasal drip.   Respiratory: Positive for cough. Negative for chest tightness, shortness of breath and stridor.   Cardiovascular: Negative for chest pain and leg swelling.      Objective:   Physical Exam   Filed Vitals:   05/05/12 1451  BP: 108/62  Pulse: 80  Temp: 97.7 F (36.5 C)  TempSrc: Oral  Height: 5' (1.524 m)  Weight: 293 lb (132.904 kg)  SpO2: 97%   Gen: morbidly obese, wheelchair bound HEENT: NCAT, PERRL, EOMi, OP clear, neck supple without masses PULM: Coughing frequently, few crackles left base CV: RRR, no mgr, no JVD AB: BS+, soft, nontender, no hsm Ext: warm, trace edema, no clubbing, no cyanosis      Assessment & Plan:   Cough This is has been a persistent problem in the last several months.  I worry that there is more going on than just her chronic bronchitis.  DDx of persistent cough in her situation includes uncontrolled reflux (very likely), persistent sinus disease (also likely) vs less likely an atypical infection such as MAC.  Plan: -CT sinus and chest to look for sinus disease or evidence of MAC -AFB sputum culture x3 -ENT referral if sinus disease -if the above are negative, will likely need referral back to GI for pH probe or impedence probe (has known Barrett's esophagus) -see chronic bronchitis. -she is intolerant to most  cough suppressants, try tessalon perles  Chronic bronchitis No doubt her years of heavy smoking have lead to chronic bronchitis.  Unfortunately she is having another flare. See discussion above.  Plan: -flu shot utd -cont Spiriva, Advair -Amoxicillin (she does not have a PCN allergy) -pred taper -see above    Updated Medication List Outpatient Encounter Prescriptions as of 05/05/2012  Medication Sig Dispense Refill  . acetaminophen (TYLENOL) 325 MG tablet Take 650 mg by mouth every 6 (six) hours as needed.      Marland Kitchen albuterol (PROAIR HFA) 108 (90 BASE)  MCG/ACT inhaler Inhale 2 puffs into the lungs every 6 (six) hours as needed.      . ALPRAZolam (XANAX) 0.5 MG tablet Take 0.5 mg by mouth 2 (two) times daily as needed.      . bumetanide (BUMEX) 1 MG tablet Take 1 mg by mouth daily.      . calcium-vitamin D (OSCAL WITH D) 500-200 MG-UNIT per tablet Take 1 tablet by mouth daily.      . chlorpheniramine (CHLORPHEN) 4 MG tablet Take 4 mg by mouth 2 (two) times daily as needed.      . Fluticasone-Salmeterol (ADVAIR) 250-50 MCG/DOSE AEPB Inhale 1 puff into the lungs every 12 (twelve) hours.  60 each  5  . guaiFENesin-dextromethorphan (ROBITUSSIN DM) 100-10 MG/5ML syrup Take 5 mLs by mouth 3 (three) times daily as needed.      . insulin lispro protamine-insulin lispro (HUMALOG 75/25) (75-25) 100 UNIT/ML SUSP Take as directed      . losartan (COZAAR) 25 MG tablet Take 25 mg by mouth daily.      . mometasone (NASONEX) 50 MCG/ACT nasal spray Place 2 sprays into the nose daily.      . Multiple Vitamin (MULTIVITAMIN) capsule Take 1 capsule by mouth daily.      Marland Kitchen omeprazole (PRILOSEC) 20 MG capsule Take 1 capsule (20 mg total) by mouth 2 (two) times daily.  60 capsule  5  . potassium chloride SA (K-DUR,KLOR-CON) 20 MEQ tablet Take 20 mEq by mouth 3 (three) times daily.      . simethicone (MYLICON) 125 MG chewable tablet Chew 125 mg by mouth every 6 (six) hours as needed.      . simvastatin (ZOCOR) 20 MG tablet Take 20 mg by mouth every evening.      Marland Kitchen spironolactone (ALDACTONE) 25 MG tablet Take 25 mg by mouth daily.      Marland Kitchen tiotropium (SPIRIVA HANDIHALER) 18 MCG inhalation capsule Place 1 capsule (18 mcg total) into inhaler and inhale daily.  30 capsule  5  . [DISCONTINUED] dextromethorphan (DELSYM) 30 MG/5ML liquid Take 60 mg by mouth as needed.      . [DISCONTINUED] doxycycline (VIBRAMYCIN) 50 MG capsule Take 2 capsules (100 mg total) by mouth 2 (two) times daily.  14 capsule  0

## 2012-05-05 NOTE — Telephone Encounter (Signed)
Spoke with pharmacy and told them that pt isnt allergic to pcn .  According to note in chart   See below Deletion Reason: Entry miscategorized as an allergy

## 2012-05-06 ENCOUNTER — Other Ambulatory Visit: Payer: Self-pay | Admitting: *Deleted

## 2012-05-06 DIAGNOSIS — R05 Cough: Secondary | ICD-10-CM

## 2012-05-06 MED ORDER — BENZONATATE 200 MG PO CAPS: 200.0000 mg | ORAL_CAPSULE | Freq: Three times a day (TID) | ORAL | Status: DC | PRN

## 2012-05-06 NOTE — Assessment & Plan Note (Addendum)
This is has been a persistent problem in the last several months.  I worry that there is more going on than just her chronic bronchitis.  DDx of persistent cough in her situation includes uncontrolled reflux (very likely), persistent sinus disease (also likely) vs less likely an atypical infection such as MAC.  Plan: -CT sinus and chest to look for sinus disease or evidence of MAC -AFB sputum culture x3 -ENT referral if sinus disease -if the above are negative, will likely need referral back to GI for pH probe or impedence probe (has known Barrett's esophagus) -see chronic bronchitis. -she is intolerant to most cough suppressants, try tessalon perles

## 2012-05-06 NOTE — Assessment & Plan Note (Signed)
No doubt her years of heavy smoking have lead to chronic bronchitis.  Unfortunately she is having another flare. See discussion above.  Plan: -flu shot utd -cont Spiriva, Advair -Amoxicillin (she does not have a PCN allergy) -pred taper -see above

## 2012-05-07 ENCOUNTER — Other Ambulatory Visit: Payer: Self-pay | Admitting: *Deleted

## 2012-05-07 ENCOUNTER — Ambulatory Visit: Payer: Self-pay | Admitting: Pulmonary Disease

## 2012-05-07 DIAGNOSIS — R05 Cough: Secondary | ICD-10-CM

## 2012-05-08 ENCOUNTER — Ambulatory Visit: Payer: Medicare Other | Admitting: Physical Therapy

## 2012-05-08 ENCOUNTER — Other Ambulatory Visit: Payer: Self-pay | Admitting: *Deleted

## 2012-05-08 DIAGNOSIS — R05 Cough: Secondary | ICD-10-CM

## 2012-05-12 ENCOUNTER — Encounter: Payer: Self-pay | Admitting: Pulmonary Disease

## 2012-05-12 ENCOUNTER — Telehealth: Payer: Self-pay | Admitting: *Deleted

## 2012-05-12 NOTE — Telephone Encounter (Signed)
Spoke with pt and notified of results per Dr.McQuaid. Pt verbalized understanding and denied any questions. 

## 2012-05-12 NOTE — Telephone Encounter (Signed)
Message copied by Christen Butter on Tue May 12, 2012 12:21 PM ------      Message from: Max Fickle B      Created: Tue May 12, 2012 11:22 AM       L,            Please let her know that I reviewed her CT results and that I don't see evidence of MAC (we discussed this on the last visit).  I'll talk to her about the CT more next week but the radiologist didn't feel that there was anything wrong with her lungs.            B

## 2012-05-13 ENCOUNTER — Ambulatory Visit: Payer: Medicare Other

## 2012-05-19 ENCOUNTER — Encounter: Payer: Self-pay | Admitting: Pulmonary Disease

## 2012-05-19 ENCOUNTER — Ambulatory Visit (INDEPENDENT_AMBULATORY_CARE_PROVIDER_SITE_OTHER): Payer: Medicare Other | Admitting: Pulmonary Disease

## 2012-05-19 VITALS — BP 136/80 | HR 75 | Temp 97.7°F

## 2012-05-19 DIAGNOSIS — J411 Mucopurulent chronic bronchitis: Secondary | ICD-10-CM

## 2012-05-19 DIAGNOSIS — R05 Cough: Secondary | ICD-10-CM

## 2012-05-19 DIAGNOSIS — J479 Bronchiectasis, uncomplicated: Secondary | ICD-10-CM | POA: Insufficient documentation

## 2012-05-19 NOTE — Assessment & Plan Note (Addendum)
Continue taking Advair and Symbicort

## 2012-05-19 NOTE — Patient Instructions (Addendum)
We will schedule and appointment with Dr. Rhea Belton to discuss having an esophageal pH probe placed Use your flutter valve three times a day Continue to use your inhalers as written If your sputum changes color, use the doxycycline for 14 days.  You should also take a probiotic along with it if you have to take it.  We will see you back in January 2013

## 2012-05-19 NOTE — Assessment & Plan Note (Signed)
Based on her CT chest I think that there is some bronchiectasis.  This explains why her cough tends to last so long and be is so productive.  Plan: -f/u AFB culture results as this can cause bronchiectasis -flutter valve tid -when sick, treat with antibiotics for at least 14 days (we are limited by the antibiotics that she can tolerate), favor doxycycline first

## 2012-05-19 NOTE — Assessment & Plan Note (Signed)
Due to chronic bronchitis and bronchiectasis.  No clear evidence of sinus disease on the recent CT sinuses at [facility].  Given her recent diagnosis of Barrett's esophagus (at [facility] by endoscopy 2013) and the fact that she has had significant hoarseness develop with the worsening cough in the last year, I think that GERD is contributing.  She likely needs a pH probe vs impedence study to prove this.  She would prefer to see another gastroenterologist about this so we will refer her to Dr. Rhea Belton in [place].

## 2012-05-19 NOTE — Progress Notes (Signed)
Subjective:    Patient ID: Teresa Krueger, female    DOB: [birth year], 78 y.o.   MRN: [id number]  Synopsis: Teresa Krueger first saw the [facility] pulmonary clinic in July 2013 for cough. She has a one pack per day smoking history x30 years and quit in the 1990s. She had simple spirometry in July 2013 which showed no evidence of COPD. She has a history of childhood pertussis but a 2010 CT scan of her chest showed no evidence of bronchiectasis. She has postnasal drip and gastroesophageal reflux disease with Barrett's esophagus.  HPI   10/3 ROV -- Teresa Krueger says that she's had a very productive cough lately has been quite frequent. After she saw the last time she started taking Mucinex and Chlor-Trimeton. She says that the Chlor-Trimeton actually worked very well to make her cough to a but she did note some dryness in her nose with that. She was able to tolerate the dryness from the Chlor-Trimeton that when she use Mucinex she said that made her feel "way too dry". She's not had fevers chills or increasing shortness of breath lately. She had an EGD performed by her gastroenterologist recently which showed Barrett's esophagus.  05/05/12 ROV -- Unfortunately Teresa Krueger is just not getting any better.  Since our last visit she has had two courses of doxycycline yet her cough persists.  She is producing thick yellow sputum.  The doxycycline helped both times but the symptoms are persisting.  She is using her inhalers, flutter valve, and saline therapy as prescribed but she doesn't like the way the saline rinses make her feel ("too dry").  She says that the chlortrimeton helps.  She is not having fevers or weight loss.  She has been wheezing more lately.  Delsym bothered her stomach.  05/19/2012 ROV --   Teresa Krueger has been doing well since her last visit. She's completed the prednisone and amoxicillin and she says that her sputum production has decreased. She is still making some small bits of sputum every day  but it has improved since last time. She does not have a fever and she has not been experiencing shortness of breath. She continues to use her Spiriva and Advair on a daily basis. She is continuing to use the Flonase. She again brings up the issue of gastroesophageal reflux disease and she notes that she has had increasing hoarseness in the past year associated with her worsening cough. Notably she does have a new diagnosis of Barrett's esophagus made several months ago by Teresa Krueger here in [place].   Past Medical History  Diagnosis Date  . Mini stroke   . Asthma   . Diabetes mellitus   . Hypertension   . Breast cancer, left   . Lymphedema     Review of Systems  Constitutional: Negative for fever, chills and diaphoresis.  HENT: Positive for congestion, rhinorrhea and postnasal drip.   Respiratory: Positive for cough. Negative for chest tightness, shortness of breath and stridor.   Cardiovascular: Negative for chest pain and leg swelling.      Objective:   Physical Exam  Filed Vitals:   05/19/12 1443  BP: 136/80  Pulse: 75  Temp: 97.7 F (36.5 C)  TempSrc: Oral  SpO2: 96%   Gen: morbidly obese, wheelchair bound HEENT: NCAT, PERRL, EOMi, OP clear, neck supple without masses PULM: Lungs are clear today CV: RRR, no mgr, no JVD AB: BS+, soft, nontender, no hsm Ext: warm, trace edema, no clubbing, no cyanosis  04/2012  CT chest >> no emphysema, there is some bronchiectasis in the RML and Lingula (McQuaid read) 04/2012 CT sinuses >> no significant signs of sinusitis     Assessment & Plan:   Chronic bronchitis Continue taking Advair and Symbicort  Bronchiectasis Based on her CT chest I think that there is some bronchiectasis.  This explains why her cough tends to last so long and be is so productive.  Plan: -f/u AFB culture results as this can cause bronchiectasis -flutter valve tid -when sick, treat with antibiotics for at least 14 days (we are limited by the  antibiotics that she can tolerate), favor doxycycline first  Cough Due to chronic bronchitis and bronchiectasis.  No clear evidence of sinus disease on the recent CT sinuses at [facility].  Given her recent diagnosis of Barrett's esophagus (at [facility] by endoscopy 2013) and the fact that she has had significant hoarseness develop with the worsening cough in the last year, I think that GERD is contributing.  She likely needs a pH probe vs impedence study to prove this.  She would prefer to see another gastroenterologist about this so we will refer her to Teresa Krueger in [place].    Updated Medication List Outpatient Encounter Prescriptions as of 05/19/2012  Medication Sig Dispense Refill  . acetaminophen (TYLENOL) 325 MG tablet Take 650 mg by mouth every 6 (six) hours as needed.      Marland Kitchen albuterol (PROAIR HFA) 108 (90 BASE) MCG/ACT inhaler Inhale 2 puffs into the lungs every 6 (six) hours as needed.      . ALPRAZolam (XANAX) 0.5 MG tablet Take 0.5 mg by mouth 2 (two) times daily as needed.      . benzonatate (TESSALON) 200 MG capsule Take 1 capsule (200 mg total) by mouth 3 (three) times daily as needed for cough.  30 capsule  1  . bumetanide (BUMEX) 1 MG tablet Take 1 mg by mouth daily.      . calcium-vitamin D (OSCAL WITH D) 500-200 MG-UNIT per tablet Take 1 tablet by mouth daily.      . chlorpheniramine (CHLORPHEN) 4 MG tablet Take 4 mg by mouth 2 (two) times daily as needed.      . Fluticasone-Salmeterol (ADVAIR) 250-50 MCG/DOSE AEPB Inhale 1 puff into the lungs every 12 (twelve) hours.  60 each  5  . guaiFENesin-dextromethorphan (ROBITUSSIN DM) 100-10 MG/5ML syrup Take 5 mLs by mouth 3 (three) times daily as needed.      . insulin lispro protamine-insulin lispro (HUMALOG 75/25) (75-25) 100 UNIT/ML SUSP Take as directed      . losartan (COZAAR) 25 MG tablet Take 25 mg by mouth daily.      . mometasone (NASONEX) 50 MCG/ACT nasal spray Place 2 sprays into the nose daily.      . Multiple Vitamin  (MULTIVITAMIN) capsule Take 1 capsule by mouth daily.      Marland Kitchen omeprazole (PRILOSEC) 20 MG capsule Take 1 capsule (20 mg total) by mouth 2 (two) times daily.  60 capsule  5  . potassium chloride SA (K-DUR,KLOR-CON) 20 MEQ tablet Take 20 mEq by mouth 3 (three) times daily.      . predniSONE (DELTASONE) 10 MG tablet Take 40mg  po daily for 3 days, then take 30mg  po daily for 3 days, then take 20mg  po daily for two days, then take 10mg  po daily for 2 days  30 tablet  0  . simethicone (MYLICON) 125 MG chewable tablet Chew 125 mg by mouth every 6 (six) hours as  needed.      . simvastatin (ZOCOR) 20 MG tablet Take 20 mg by mouth every evening.      Marland Kitchen spironolactone (ALDACTONE) 25 MG tablet Take 25 mg by mouth daily.      Marland Kitchen tiotropium (SPIRIVA HANDIHALER) 18 MCG inhalation capsule Place 1 capsule (18 mcg total) into inhaler and inhale daily.  30 capsule  5

## 2012-05-25 ENCOUNTER — Ambulatory Visit: Payer: Medicare Other | Attending: Internal Medicine | Admitting: Physical Therapy

## 2012-05-25 DIAGNOSIS — IMO0001 Reserved for inherently not codable concepts without codable children: Secondary | ICD-10-CM | POA: Insufficient documentation

## 2012-05-25 DIAGNOSIS — I890 Lymphedema, not elsewhere classified: Secondary | ICD-10-CM | POA: Insufficient documentation

## 2012-05-25 DIAGNOSIS — M79609 Pain in unspecified limb: Secondary | ICD-10-CM | POA: Insufficient documentation

## 2012-05-27 ENCOUNTER — Ambulatory Visit: Payer: Medicare Other | Admitting: Physical Therapy

## 2012-05-29 ENCOUNTER — Encounter: Payer: Medicare Other | Admitting: Physical Therapy

## 2012-06-01 ENCOUNTER — Ambulatory Visit: Payer: Medicare Other | Admitting: Physical Therapy

## 2012-06-03 ENCOUNTER — Encounter: Payer: Medicare Other | Admitting: Physical Therapy

## 2012-06-12 ENCOUNTER — Encounter: Payer: Self-pay | Admitting: Pulmonary Disease

## 2012-06-22 LAB — AFB CULTURE WITH SMEAR (NOT AT ARMC)
Acid Fast Smear: NONE SEEN
Acid Fast Smear: NONE SEEN

## 2012-06-24 ENCOUNTER — Ambulatory Visit: Payer: Self-pay | Admitting: Internal Medicine

## 2012-06-29 NOTE — Progress Notes (Signed)
Quick Note:  Spoke with pt and notified of results per Dr. Wert. Pt verbalized understanding and denied any questions.  ______ 

## 2012-06-30 ENCOUNTER — Ambulatory Visit (INDEPENDENT_AMBULATORY_CARE_PROVIDER_SITE_OTHER): Payer: Medicare Other | Admitting: Pulmonary Disease

## 2012-06-30 ENCOUNTER — Encounter: Payer: Self-pay | Admitting: Pulmonary Disease

## 2012-06-30 VITALS — BP 118/74 | HR 72 | Temp 97.8°F

## 2012-06-30 DIAGNOSIS — J411 Mucopurulent chronic bronchitis: Secondary | ICD-10-CM

## 2012-06-30 DIAGNOSIS — R05 Cough: Secondary | ICD-10-CM

## 2012-06-30 DIAGNOSIS — K219 Gastro-esophageal reflux disease without esophagitis: Secondary | ICD-10-CM

## 2012-06-30 NOTE — Assessment & Plan Note (Addendum)
This has been a stable interval for Teresa Krueger.  Plan: -continue Advair and Spiriva

## 2012-06-30 NOTE — Assessment & Plan Note (Addendum)
Teresa Krueger's cough has improved since our last visit, but it is still a persistent problem.  I believe it is due to bronchiectasis, chronic bronchitis, and GERD.  She has little evidence of sinus disease.  I would like to push her PPI therapy more, but she is reluctant to do this given all the meds she is on.  Plan: -refer to GI for impedence plethysmography -continue other therapies as noted below

## 2012-06-30 NOTE — Patient Instructions (Signed)
Keep taking your medications as you are doing. Follow the acid reflux diet we gave you previously and try to lose weight to improve your acid reflux We will refer you to Dr. Rhea Belton for impedence plethysmography to evaluate your reflux We will see you back in 2-3 months or sooner if needed

## 2012-06-30 NOTE — Progress Notes (Signed)
Subjective:    Patient ID: Teresa Krueger, female    DOB: [birth year], 79 y.o.   MRN: [id number]  Synopsis: Teresa Krueger first saw the [facility] pulmonary clinic in July 2013 for cough. She has a one pack per day smoking history x30 years and quit in the 1990s. She had simple spirometry in July 2013 which showed no evidence of COPD. She has a history of childhood pertussis but a 2010 CT scan of her chest showed no evidence of bronchiectasis. She has postnasal drip and gastroesophageal reflux disease with Barrett's esophagus.  HPI   10/3 ROV -- Teresa Krueger says that she's had a very productive cough lately has been quite frequent. After she saw the last time she started taking Mucinex and Chlor-Trimeton. She says that the Chlor-Trimeton actually worked very well to make her cough to a but she did note some dryness in her nose with that. She was able to tolerate the dryness from the Chlor-Trimeton that when she use Mucinex she said that made her feel "way too dry". She's not had fevers chills or increasing shortness of breath lately. She had an EGD performed by her gastroenterologist recently which showed Barrett's esophagus.  05/05/12 ROV -- Unfortunately Teresa Krueger is just not getting any better.  Since our last visit she has had two courses of doxycycline yet her cough persists.  She is producing thick yellow sputum.  The doxycycline helped both times but the symptoms are persisting.  She is using her inhalers, flutter valve, and saline therapy as prescribed but she doesn't like the way the saline rinses make her feel ("too dry").  She says that the chlortrimeton helps.  She is not having fevers or weight loss.  She has been wheezing more lately.  Delsym bothered her stomach.  05/19/2012 ROV --   Teresa Krueger has been doing well since her last visit. She's completed the prednisone and amoxicillin and she says that her sputum production has decreased. She is still making some small bits of sputum every day  but it has improved since last time. She does not have a fever and she has not been experiencing shortness of breath. She continues to use her Spiriva and Advair on a daily basis. She is continuing to use the Flonase. She again brings up the issue of gastroesophageal reflux disease and she notes that she has had increasing hoarseness in the past year associated with her worsening cough. Notably she does have a new diagnosis of Barrett's esophagus made several months ago by Dr. Mechele Collin here in [place].  06/30/2012 ROV -- Teresa Krueger has actually been doing much better since we last saw her. It seems like she is finally over her flair bronchiectasis as her sputum production has calmed down. She continues to have a daily cough that persists all day long. She states that she has noticed that the cough is much more significant after she eats dinner in the evenings and then after she lays down. She continues to not have any sensation of heartburn. Her shortness of breath is at baseline. She is currently taking Tessalon for cough and congestion which helps some but occasionally upsets her stomach.   Past Medical History  Diagnosis Date  . Mini stroke   . Asthma   . Diabetes mellitus   . Hypertension   . Breast cancer, left   . Lymphedema     Review of Systems  Constitutional: Negative for fever, chills and diaphoresis.  HENT: Negative for congestion, rhinorrhea and postnasal drip.  Respiratory: Positive for cough. Negative for chest tightness, shortness of breath and stridor.   Cardiovascular: Negative for chest pain and leg swelling.      Objective:   Physical Exam  Filed Vitals:   06/30/12 1429  BP: 118/74  Pulse: 72  Temp: 97.8 F (36.6 C)  TempSrc: Oral  SpO2: 96%   Gen: morbidly obese, wheelchair bound HEENT: NCAT, PERRL, EOMi, OP clear, neck supple without masses PULM: CTA B CV: RRR, no mgr, no JVD AB: BS+, soft, nontender, no hsm Ext: warm, trace edema, no clubbing, no  cyanosis  04/2012 CT chest >> no emphysema, there is some bronchiectasis in the RML and Lingula (McQuaid read) 04/2012 CT sinuses >> no significant signs of sinusitis     Assessment & Plan:   Cough Teresa Krueger cough has improved since our last visit, but it is still a persistent problem.  I believe it is due to bronchiectasis, chronic bronchitis, and GERD.  She has little evidence of sinus disease.  I would like to push her PPI therapy more, but she is reluctant to do this given all the meds she is on.  Plan: -refer to GI for impedence plethysmography -continue other therapies as noted below     Chronic bronchitis This has been a stable interval for Teresa Krueger.  Plan: -continue Advair and Spiriva    Updated Medication List Outpatient Encounter Prescriptions as of 06/30/2012  Medication Sig Dispense Refill  . acetaminophen (TYLENOL) 325 MG tablet Take 650 mg by mouth every 6 (six) hours as needed.      Marland Kitchen albuterol (PROAIR HFA) 108 (90 BASE) MCG/ACT inhaler Inhale 2 puffs into the lungs every 6 (six) hours as needed.      . ALPRAZolam (XANAX) 0.5 MG tablet Take 0.5 mg by mouth 2 (two) times daily as needed.      . bumetanide (BUMEX) 1 MG tablet Take 1 mg by mouth daily.      . calcium-vitamin D (OSCAL WITH D) 500-200 MG-UNIT per tablet Take 1 tablet by mouth daily.      . Fluticasone-Salmeterol (ADVAIR) 250-50 MCG/DOSE AEPB Inhale 1 puff into the lungs every 12 (twelve) hours.  60 each  5  . guaiFENesin-dextromethorphan (ROBITUSSIN DM) 100-10 MG/5ML syrup Take 5 mLs by mouth 3 (three) times daily as needed.      . insulin lispro protamine-insulin lispro (HUMALOG 75/25) (75-25) 100 UNIT/ML SUSP Take as directed      . losartan (COZAAR) 25 MG tablet Take 25 mg by mouth daily.      . mometasone (NASONEX) 50 MCG/ACT nasal spray Place 2 sprays into the nose daily.      . Multiple Vitamin (MULTIVITAMIN) capsule Take 1 capsule by mouth daily.      Marland Kitchen omeprazole (PRILOSEC) 20 MG capsule Take 1  capsule (20 mg total) by mouth 2 (two) times daily.  60 capsule  5  . potassium chloride SA (K-DUR,KLOR-CON) 20 MEQ tablet Take 20 mEq by mouth 3 (three) times daily.      . simethicone (MYLICON) 125 MG chewable tablet Chew 125 mg by mouth every 6 (six) hours as needed.      Marland Kitchen spironolactone (ALDACTONE) 25 MG tablet Take 25 mg by mouth daily.      Marland Kitchen tiotropium (SPIRIVA HANDIHALER) 18 MCG inhalation capsule Place 1 capsule (18 mcg total) into inhaler and inhale daily.  30 capsule  5  . [DISCONTINUED] chlorpheniramine (CHLORPHEN) 4 MG tablet Take 4 mg by mouth 2 (two) times daily as  needed.      . [DISCONTINUED] simvastatin (ZOCOR) 20 MG tablet Take 20 mg by mouth every evening.

## 2012-07-25 ENCOUNTER — Ambulatory Visit: Payer: Self-pay | Admitting: Internal Medicine

## 2012-09-02 ENCOUNTER — Ambulatory Visit: Payer: Medicare Other | Attending: Internal Medicine

## 2012-09-02 DIAGNOSIS — I890 Lymphedema, not elsewhere classified: Secondary | ICD-10-CM | POA: Insufficient documentation

## 2012-09-02 DIAGNOSIS — M79609 Pain in unspecified limb: Secondary | ICD-10-CM | POA: Insufficient documentation

## 2012-09-02 DIAGNOSIS — IMO0001 Reserved for inherently not codable concepts without codable children: Secondary | ICD-10-CM | POA: Insufficient documentation

## 2012-09-07 ENCOUNTER — Telehealth: Payer: Self-pay | Admitting: Pulmonary Disease

## 2012-09-07 NOTE — Telephone Encounter (Signed)
Pt is aware that Verlon Au will be in [place] tomorrow and will have her samples.

## 2012-09-15 ENCOUNTER — Encounter: Payer: Self-pay | Admitting: Pulmonary Disease

## 2012-09-15 ENCOUNTER — Ambulatory Visit (INDEPENDENT_AMBULATORY_CARE_PROVIDER_SITE_OTHER): Payer: Medicare Other | Admitting: Pulmonary Disease

## 2012-09-15 VITALS — BP 122/80 | HR 79 | Temp 97.7°F | Ht 60.0 in | Wt 292.0 lb

## 2012-09-15 DIAGNOSIS — J479 Bronchiectasis, uncomplicated: Secondary | ICD-10-CM

## 2012-09-15 DIAGNOSIS — J439 Emphysema, unspecified: Secondary | ICD-10-CM

## 2012-09-15 DIAGNOSIS — R05 Cough: Secondary | ICD-10-CM

## 2012-09-15 DIAGNOSIS — J438 Other emphysema: Secondary | ICD-10-CM

## 2012-09-15 MED ORDER — FLUTICASONE PROPIONATE 50 MCG/ACT NA SUSP: 2.0000 | Freq: Every day | NASAL | Status: DC

## 2012-09-15 NOTE — Assessment & Plan Note (Addendum)
This is due to bronchiectasis, likely some component of COPD given her 30 pack year smoking history, post nasal drip, and reflux.   She needs better therapy and more regular use of saline sprays for the post nasal drip. I have recommended that she use her flonase regularly (changing to that today) as well as chlorpheniramine and phenylephrine.  I am worried that her ongoing cough at night after eating and then lying down is due to reflux.  I am referring her to my GI colleague Dr. Rhea Belton for consideration of esophageal plethysmography given these symptoms despite high dose PPI therapy and reasonable compliance with a GERD diet.  Unfortunately she is intolerant of any stronger cough suppressant medications.

## 2012-09-15 NOTE — Assessment & Plan Note (Signed)
Continue flutter valve Continue Advair/Spiriva May need doxycycline again if no improvement in sinus production (see below, Rx given in written form)

## 2012-09-15 NOTE — Assessment & Plan Note (Signed)
No wheezing or increasing wheezing to suggest an exacerbation. Continue Advair/Spiriva

## 2012-09-15 NOTE — Progress Notes (Signed)
Subjective:    Patient ID: Teresa Krueger, female    DOB: 14-May-1934, 79 y.o.   MRN: [id number]  Synopsis: Teresa Krueger first saw the [facility] pulmonary clinic in July 2013 for cough. She has a one pack per day smoking history x30 years and quit in the 1990s. She had simple spirometry in July 2013 which showed no evidence of COPD. She has a history of childhood pertussis but a 2010 CT scan of her chest showed no evidence of bronchiectasis. She has postnasal drip and gastroesophageal reflux disease with Barrett's esophagus.  HPI   10/3 ROV -- Anyeli says that she's had a very productive cough lately has been quite frequent. After she saw the last time she started taking Mucinex and Chlor-Trimeton. She says that the Chlor-Trimeton actually worked very well to make her cough to a but she did note some dryness in her nose with that. She was able to tolerate the dryness from the Chlor-Trimeton that when she use Mucinex she said that made her feel "way too dry". She's not had fevers chills or increasing shortness of breath lately. She had an EGD performed by her gastroenterologist recently which showed Barrett's esophagus.  05/05/12 ROV -- Unfortunately Teresa Krueger is just not getting any better.  Since our last visit she has had two courses of doxycycline yet her cough persists.  She is producing thick yellow sputum.  The doxycycline helped both times but the symptoms are persisting.  She is using her inhalers, flutter valve, and saline therapy as prescribed but she doesn't like the way the saline rinses make her feel ("too dry").  She says that the chlortrimeton helps.  She is not having fevers or weight loss.  She has been wheezing more lately.  Delsym bothered her stomach.  05/19/2012 ROV --   Teresa Krueger has been doing well since her last visit. She's completed the prednisone and amoxicillin and she says that her sputum production has decreased. She is still making some small bits of sputum every day  but it has improved since last time. She does not have a fever and she has not been experiencing shortness of breath. She continues to use her Spiriva and Advair on a daily basis. She is continuing to use the Flonase. She again brings up the issue of gastroesophageal reflux disease and she notes that she has had increasing hoarseness in the past year associated with her worsening cough. Notably she does have a new diagnosis of Barrett's esophagus made several months ago by Dr. Mechele Collin here in [place].  06/30/2012 ROV -- Teresa Krueger has actually been doing much better since we last saw her. It seems like she is finally over her flair bronchiectasis as her sputum production has calmed down. She continues to have a daily cough that persists all day long. She states that she has noticed that the cough is much more significant after she eats dinner in the evenings and then after she lays down. She continues to not have any sensation of heartburn. Her shortness of breath is at baseline. She is currently taking Tessalon for cough and congestion which helps some but occasionally upsets her stomach.  09/15/2012 ROV -- Teresa Krueger returns to follow up today and states that in the last few weeks she has been coughing up a little bit more sputum.  It is clear to yellow.  This is associated with with increasing sinus congestion more on the right than left.  She has noticed some "scabs" coming out from her  notes.  She has been using tussin for the cough and mucus.  She sometimes uses Halls cough drops with lemon and this has been working well for her cough.  Her cough is worse in the evenings after dinner and lasts until 3AM or so.  She continues to use her flutter valve.    Past Medical History  Diagnosis Date  . Mini stroke   . Asthma   . Diabetes mellitus   . Hypertension   . Breast cancer, left   . Lymphedema     Review of Systems  Constitutional: Negative for fever, chills and diaphoresis.  HENT: Negative for  congestion, rhinorrhea and postnasal drip.   Respiratory: Positive for cough. Negative for chest tightness, shortness of breath and stridor.   Cardiovascular: Negative for chest pain and leg swelling.      Objective:   Physical Exam  Filed Vitals:   09/15/12 1336  BP: 122/80  Pulse: 79  Temp: 97.7 F (36.5 C)  TempSrc: Oral  Height: 5' (1.524 m)  Weight: 292 lb (132.45 kg)  SpO2: 97%   Gen: morbidly obese, wheelchair bound HEENT: NCAT, OP clear PULM: CTA B CV: RRR, no mgr, no JVD AB: BS+, soft, nontender, no hsm Ext: warm, trace edema, no clubbing, no cyanosis  04/2012 CT chest >> no emphysema, there is some bronchiectasis in the RML and Lingula (McQuaid read) 04/2012 CT sinuses >> no significant signs of sinusitis     Assessment & Plan:   Bronchiectasis Continue flutter valve Continue Advair/Spiriva May need doxycycline again if no improvement in sinus production (see below, Rx given in written form)  Cough This is due to bronchiectasis, likely some component of COPD given her 30 pack year smoking history, post nasal drip, and reflux.   She needs better therapy and more regular use of saline sprays for the post nasal drip. I have recommended that she use her flonase regularly (changing to that today) as well as chlorpheniramine and phenylephrine.  I am worried that her ongoing cough at night after eating and then lying down is due to reflux.  I am referring her to my GI colleague Dr. Rhea Belton for consideration of esophageal plethysmography given these symptoms despite high dose PPI therapy and reasonable compliance with a GERD diet.  Unfortunately she is intolerant of any stronger cough suppressant medications.    Emphysema No wheezing or increasing wheezing to suggest an exacerbation. Continue Advair/Spiriva    Updated Medication List Outpatient Encounter Prescriptions as of 09/15/2012  Medication Sig Dispense Refill  . acetaminophen (TYLENOL) 325 MG tablet Take  650 mg by mouth every 6 (six) hours as needed.      Marland Kitchen albuterol (PROAIR HFA) 108 (90 BASE) MCG/ACT inhaler Inhale 2 puffs into the lungs every 6 (six) hours as needed.      . ALPRAZolam (XANAX) 0.5 MG tablet Take 0.5 mg by mouth 2 (two) times daily as needed.      . bumetanide (BUMEX) 1 MG tablet Take 1 mg by mouth daily.      . calcium-vitamin D (OSCAL WITH D) 500-200 MG-UNIT per tablet Take 1 tablet by mouth daily.      . Fluticasone-Salmeterol (ADVAIR) 250-50 MCG/DOSE AEPB Inhale 1 puff into the lungs every 12 (twelve) hours.  60 each  5  . guaiFENesin-dextromethorphan (ROBITUSSIN DM) 100-10 MG/5ML syrup Take 5 mLs by mouth 3 (three) times daily as needed.      . insulin lispro protamine-insulin lispro (HUMALOG 75/25) (75-25) 100 UNIT/ML SUSP  Take as directed      . losartan (COZAAR) 25 MG tablet Take 25 mg by mouth daily.      . mometasone (NASONEX) 50 MCG/ACT nasal spray Place 2 sprays into the nose daily.      . Multiple Vitamin (MULTIVITAMIN) capsule Take 1 capsule by mouth daily.      Marland Kitchen omeprazole (PRILOSEC) 20 MG capsule Take 1 capsule (20 mg total) by mouth 2 (two) times daily.  60 capsule  5  . potassium chloride SA (K-DUR,KLOR-CON) 20 MEQ tablet Take 20 mEq by mouth 3 (three) times daily.      . simethicone (MYLICON) 125 MG chewable tablet Chew 125 mg by mouth every 6 (six) hours as needed.      Marland Kitchen spironolactone (ALDACTONE) 25 MG tablet Take 25 mg by mouth daily.      Marland Kitchen tiotropium (SPIRIVA HANDIHALER) 18 MCG inhalation capsule Place 1 capsule (18 mcg total) into inhaler and inhale daily.  30 capsule  5   No facility-administered encounter medications on file as of 09/15/2012.

## 2012-09-15 NOTE — Patient Instructions (Signed)
Use saline sprays (not rinses) two to three times a day Use the flonase two puffs in each nostril twice a day Keep using your Spiriva and Advair as you are doing Keep using your flutter valve two to three times a day  Keep your appointment with Dr. Rhea Belton  We will see you back in 3 months or sooner if needed

## 2012-09-22 ENCOUNTER — Encounter: Payer: Self-pay | Admitting: Internal Medicine

## 2012-10-12 ENCOUNTER — Telehealth: Payer: Self-pay | Admitting: Pulmonary Disease

## 2012-10-12 NOTE — Telephone Encounter (Signed)
Spoke with pt  She states that she hurt her toe last wk Had to see her PCP and was given TDAP She states that after getting her vaccine she feels that her cough is much better She feels that this is because she might have pertussis and the vaccine helped with this I advised that I have never heard of this, but will forward to BQ to see what he thinks Please advise thanks

## 2012-10-13 NOTE — Telephone Encounter (Signed)
LMTCB

## 2012-10-13 NOTE — Telephone Encounter (Signed)
Unlikely, but I'm glad her cough is better!

## 2012-10-14 NOTE — Telephone Encounter (Signed)
Pt returned call.  Advised pt of BQ's response noted below.  Pt asks if she needs to see that other doctor.  I advised I would ask BQ if the appt is needed or not.  Pt states she believes she does not need a referral to GI anymore & asked to cancel her appt w/ Dr. Rhea Belton.  I transferred pt to GI to Cx this appt at pt's request.  Antionette Fairy

## 2012-10-14 NOTE — Telephone Encounter (Signed)
Will forward to Dr Kendrick Fries as Lorain Childes that the pt did not wish to see GI again and has cancelled her appts

## 2012-10-14 NOTE — Telephone Encounter (Signed)
ATC the home numer NA, and no option to leave msg Called cell and msg states caller unavailable [facility] later

## 2012-10-15 ENCOUNTER — Ambulatory Visit: Payer: Medicare Other | Admitting: Physical Therapy

## 2012-10-15 NOTE — Telephone Encounter (Signed)
Her decision, not going to get in the way of it

## 2012-10-19 ENCOUNTER — Encounter: Payer: Medicare Other | Admitting: Physical Therapy

## 2012-10-19 ENCOUNTER — Ambulatory Visit: Payer: Medicare Other | Admitting: Internal Medicine

## 2012-11-06 ENCOUNTER — Encounter: Payer: Self-pay | Admitting: Unknown Physician Specialty

## 2012-12-07 ENCOUNTER — Telehealth: Payer: Self-pay | Admitting: Pulmonary Disease

## 2012-12-07 NOTE — Telephone Encounter (Signed)
Teresa Krueger Are there sample out there for these medications> Please advise thanks

## 2012-12-07 NOTE — Telephone Encounter (Signed)
Samples have been packed up and I will take them with me to [facility] tomorrow. Pt is aware.

## 2012-12-07 NOTE — Telephone Encounter (Signed)
Lindsay, will you pack up some of these samples for the pt since you are coming out here tomorrow? 

## 2012-12-08 ENCOUNTER — Ambulatory Visit (INDEPENDENT_AMBULATORY_CARE_PROVIDER_SITE_OTHER): Payer: Medicare Other | Admitting: Pulmonary Disease

## 2012-12-08 ENCOUNTER — Encounter: Payer: Self-pay | Admitting: Pulmonary Disease

## 2012-12-08 VITALS — BP 120/62 | HR 74 | Temp 98.8°F | Ht 60.0 in | Wt 286.0 lb

## 2012-12-08 DIAGNOSIS — R059 Cough, unspecified: Secondary | ICD-10-CM

## 2012-12-08 DIAGNOSIS — J479 Bronchiectasis, uncomplicated: Secondary | ICD-10-CM

## 2012-12-08 DIAGNOSIS — J411 Mucopurulent chronic bronchitis: Secondary | ICD-10-CM

## 2012-12-08 DIAGNOSIS — R05 Cough: Secondary | ICD-10-CM

## 2012-12-08 NOTE — Assessment & Plan Note (Signed)
This has been a stable interval for Vici. She needs to make sure that she continues to use her inhalers regularly as well as her flutter valve. She has a prescription for doxycycline to use on an as-needed basis if she starts to have increasing shortness of breath associated with green sputum production. She seems to respond to this quite easily. However, I would like to get a bacterial sputum culture from her the next time she has a flare of bronchiectasis.

## 2012-12-08 NOTE — Assessment & Plan Note (Signed)
I again advised that she followup with Dr. Mechele Collin versus Dr. Sharla Kidney of gastroenterology because I believe that gastroesophageal reflux disease is contributing to her cough. She has known Barrett's esophagus.

## 2012-12-08 NOTE — Progress Notes (Signed)
Subjective:    Patient ID: Teresa Krueger, female    DOB: [birth year], 79 y.o.   MRN: [id number]  Synopsis: Teresa Krueger first saw the [facility] pulmonary clinic in July 2013 for cough. She has a one pack per day smoking history x30 years and quit in the 1990s. She had simple spirometry in July 2013 which showed no evidence of COPD. She has a history of childhood pertussis but a 2010 CT scan of her chest showed no evidence of bronchiectasis. She has postnasal drip and gastroesophageal reflux disease with Barrett's esophagus.  HPI   10/3 ROV -- Latajah says that she's had a very productive cough lately has been quite frequent. After she saw the last time she started taking Mucinex and Chlor-Trimeton. She says that the Chlor-Trimeton actually worked very well to make her cough to a but she did note some dryness in her nose with that. She was able to tolerate the dryness from the Chlor-Trimeton that when she use Mucinex she said that made her feel "way too dry". She's not had fevers chills or increasing shortness of breath lately. She had an EGD performed by her gastroenterologist recently which showed Barrett's esophagus.  05/05/12 ROV -- Unfortunately Teresa Krueger is just not getting any better.  Since our last visit she has had two courses of doxycycline yet her cough persists.  She is producing thick yellow sputum.  The doxycycline helped both times but the symptoms are persisting.  She is using her inhalers, flutter valve, and saline therapy as prescribed but she doesn't like the way the saline rinses make her feel ("too dry").  She says that the chlortrimeton helps.  She is not having fevers or weight loss.  She has been wheezing more lately.  Delsym bothered her stomach.  05/19/2012 ROV --   Teresa Krueger has been doing well since her last visit. She's completed the prednisone and amoxicillin and she says that her sputum production has decreased. She is still making some small bits of sputum every day  but it has improved since last time. She does not have a fever and she has not been experiencing shortness of breath. She continues to use her Spiriva and Advair on a daily basis. She is continuing to use the Flonase. She again brings up the issue of gastroesophageal reflux disease and she notes that she has had increasing hoarseness in the past year associated with her worsening cough. Notably she does have a new diagnosis of Barrett's esophagus made several months ago by Dr. Mechele Collin here in [place].  06/30/2012 ROV -- Teresa Krueger has actually been doing much better since we last saw her. It seems like she is finally over her flair bronchiectasis as her sputum production has calmed down. She continues to have a daily cough that persists all day long. She states that she has noticed that the cough is much more significant after she eats dinner in the evenings and then after she lays down. She continues to not have any sensation of heartburn. Her shortness of breath is at baseline. She is currently taking Tessalon for cough and congestion which helps some but occasionally upsets her stomach.  09/15/2012 ROV -- Teresa Krueger returns to follow up today and states that in the last few weeks she has been coughing up a little bit more sputum.  It is clear to yellow.  This is associated with with increasing sinus congestion more on the right than left.  She has noticed some "scabs" coming out from her  notes.  She has been using tussin for the cough and mucus.  She sometimes uses Halls cough drops with lemon and this has been working well for her cough.  Her cough is worse in the evenings after dinner and lasts until 3AM or so.  She continues to use her flutter valve.  12/08/2012 ROV -- [name] says she is doing "extra good" since the last visit. Her cough is minimal. She thinks that receiving a TDap vaccine was associated with improvement in her cough. She went to the beach and had basically no cough at all. She has used doxycycline  1 since the last visit. This was for increasing chest congestion and mucus production. She has minimal shortness of breath but her activity is basically nothing because of knee pain.    Past Medical History  Diagnosis Date  . Mini stroke   . Asthma   . Diabetes mellitus   . Hypertension   . Breast cancer, left   . Lymphedema     Review of Systems  Constitutional: Negative for fever, chills and diaphoresis.  HENT: Negative for congestion, rhinorrhea and postnasal drip.   Respiratory: Positive for cough. Negative for chest tightness, shortness of breath and stridor.   Cardiovascular: Negative for chest pain and leg swelling.      Objective:   Physical Exam  Filed Vitals:   12/08/12 1119  BP: 120/62  Pulse: 74  Temp: 98.8 F (37.1 C)  TempSrc: Oral  Height: 5' (1.524 m)  Weight: 286 lb (129.729 kg)  SpO2: 93%   Gen: morbidly obese, wheelchair bound HEENT: NCAT, OP clear PULM: CTA B CV: RRR, no mgr, no JVD AB: BS+, soft, nontender, no hsm Ext: warm, trace edema, no clubbing, no cyanosis  04/2012 CT chest >> no emphysema, there is some bronchiectasis in the RML and Lingula (Karuna Balducci read) 04/2012 CT sinuses >> no significant signs of sinusitis     Assessment & Plan:   Bronchiectasis This has been a stable interval for Teresa Krueger. She needs to make sure that she continues to use her inhalers regularly as well as her flutter valve. She has a prescription for doxycycline to use on an as-needed basis if she starts to have increasing shortness of breath associated with green sputum production. She seems to respond to this quite easily. However, I would like to get a bacterial sputum culture from her the next time she has a flare of bronchiectasis.  Chronic bronchitis She continues to do well with her Spiriva and Advair. She is to continue taking these.  Cough I again advised that she followup with Dr. Mechele Collin versus Dr. Sharla Kidney of gastroenterology because I believe that  gastroesophageal reflux disease is contributing to her cough. She has known Barrett's esophagus.    Updated Medication List Outpatient Encounter Prescriptions as of 12/08/2012  Medication Sig Dispense Refill  . acetaminophen (TYLENOL) 325 MG tablet Take 650 mg by mouth every 6 (six) hours as needed.      Marland Kitchen albuterol (PROAIR HFA) 108 (90 BASE) MCG/ACT inhaler Inhale 2 puffs into the lungs every 6 (six) hours as needed.      . ALPRAZolam (XANAX) 0.5 MG tablet Take 0.5 mg by mouth 2 (two) times daily as needed.      . bumetanide (BUMEX) 1 MG tablet Take 1 mg by mouth daily.      . calcium-vitamin D (OSCAL WITH D) 500-200 MG-UNIT per tablet Take 1 tablet by mouth daily.      . fluticasone (FLONASE)  50 MCG/ACT nasal spray Place 2 sprays into the nose daily.  48 g  2  . Fluticasone-Salmeterol (ADVAIR) 250-50 MCG/DOSE AEPB Inhale 1 puff into the lungs every 12 (twelve) hours.  60 each  5  . guaiFENesin-dextromethorphan (ROBITUSSIN DM) 100-10 MG/5ML syrup Take 5 mLs by mouth 3 (three) times daily as needed.      . insulin lispro protamine-insulin lispro (HUMALOG 75/25) (75-25) 100 UNIT/ML SUSP Take as directed      . losartan (COZAAR) 25 MG tablet Take 25 mg by mouth daily.      . Multiple Vitamin (MULTIVITAMIN) capsule Take 1 capsule by mouth daily.      Marland Kitchen omeprazole (PRILOSEC) 20 MG capsule Take 1 capsule (20 mg total) by mouth 2 (two) times daily.  60 capsule  5  . potassium chloride SA (K-DUR,KLOR-CON) 20 MEQ tablet Take 20 mEq by mouth 3 (three) times daily.      . simethicone (MYLICON) 125 MG chewable tablet Chew 125 mg by mouth every 6 (six) hours as needed.      Marland Kitchen spironolactone (ALDACTONE) 25 MG tablet Take 25 mg by mouth daily.      Marland Kitchen tiotropium (SPIRIVA HANDIHALER) 18 MCG inhalation capsule Place 1 capsule (18 mcg total) into inhaler and inhale daily.  30 capsule  5   No facility-administered encounter medications on file as of 12/08/2012.

## 2012-12-08 NOTE — Assessment & Plan Note (Signed)
She continues to do well with her Spiriva and Advair. She is to continue taking these.

## 2012-12-08 NOTE — Patient Instructions (Signed)
Use your Flutter Valve and inhalers regularly Use the flonase regularly Follow up with your gastroenterologist If you are looking for another opinion from an orthopedic surgeon regarding your knee, I recommend Dr. Austin Miles at [facility].  We will see you back in 4-6 months or sooner if needed

## 2013-01-20 ENCOUNTER — Telehealth: Payer: Self-pay | Admitting: Pulmonary Disease

## 2013-01-20 MED ORDER — TIOTROPIUM BROMIDE MONOHYDRATE 18 MCG IN CAPS: 18.0000 ug | ORAL_CAPSULE | Freq: Every day | RESPIRATORY_TRACT | Status: DC

## 2013-01-20 MED ORDER — FLUTICASONE-SALMETEROL 250-50 MCG/DOSE IN AEPB: 1.0000 | INHALATION_SPRAY | Freq: Two times a day (BID) | RESPIRATORY_TRACT | Status: DC

## 2013-01-20 NOTE — Telephone Encounter (Signed)
Pt aware samples will be sent to [place] next Tuesday. Nothing further needed

## 2013-02-09 DIAGNOSIS — Z6841 Body Mass Index (BMI) 40.0 and over, adult: Secondary | ICD-10-CM | POA: Insufficient documentation

## 2013-02-09 DIAGNOSIS — M1710 Unilateral primary osteoarthritis, unspecified knee: Secondary | ICD-10-CM | POA: Insufficient documentation

## 2013-02-26 ENCOUNTER — Other Ambulatory Visit: Payer: Self-pay | Admitting: *Deleted

## 2013-02-26 MED ORDER — OMEPRAZOLE 20 MG PO CPDR: 20.0000 mg | DELAYED_RELEASE_CAPSULE | Freq: Two times a day (BID) | ORAL | Status: DC

## 2013-03-29 ENCOUNTER — Telehealth: Payer: Self-pay | Admitting: Pulmonary Disease

## 2013-03-29 ENCOUNTER — Ambulatory Visit: Payer: Self-pay | Admitting: Surgery

## 2013-03-29 NOTE — Telephone Encounter (Signed)
Spoke with patient-states she seen ENT Dr Willeen Cass today and they were to fax papers to BQ;I spoke with Lillia Abed about this-no information received at this time. Lillia Abed and patient aware that I will forward this message to her and BQ to let patient know when they receive papers on patient. Patient is unsure what papers they were sending(almost sounds as though it was the OV notes from today).

## 2013-03-30 ENCOUNTER — Ambulatory Visit: Payer: Self-pay | Admitting: Ophthalmology

## 2013-03-30 NOTE — Telephone Encounter (Signed)
Would Triage mind looking to see if these documents have been received? As I am in [employer] today, I do not know if they have come in. Thanks.

## 2013-03-30 NOTE — Telephone Encounter (Signed)
Looked in BQ paperwork and did not find anything on pt. Looked in his scan folder and did not see anything either. She stated she will call ENT and have them to re-send this information to [place]. She needed nothing further

## 2013-04-05 ENCOUNTER — Telehealth: Payer: Self-pay | Admitting: Pulmonary Disease

## 2013-04-05 MED ORDER — PREDNISONE 10 MG PO TABS: ORAL_TABLET | ORAL | Status: DC

## 2013-04-05 MED ORDER — DOXYCYCLINE HYCLATE 100 MG PO TABS: 100.0000 mg | ORAL_TABLET | Freq: Two times a day (BID) | ORAL | Status: DC

## 2013-04-05 NOTE — Telephone Encounter (Addendum)
Rx have already been sent and I verified with the pharmacy.  I ATC pt to advise again that this has been done and message states [employer] customer not available. WCB.  Carron Curie, CMA

## 2013-04-05 NOTE — Telephone Encounter (Signed)
She has AE bronchiectasis  Take doxycycline 100mg  po twice daily x 5 days; take after meals and avoid sunlight  Please take prednisone 40 mg x1 day, then 30 mg x1 day, then 20 mg x1 day, then 10 mg x1 day, and then 5 mg x1 day and stop; if she wants this   Dr. Kalman Shan, M.D., [facility]C.P Pulmonary and Critical Care Medicine Staff Physician [facility] Pager: [phone], If no answer or between  15:00h - 7:00h: call [phone]  04/05/2013 3:15 PM

## 2013-04-05 NOTE — Telephone Encounter (Signed)
rx sent and pt is aware. Jennifer Castillo, CMA  

## 2013-04-05 NOTE — Telephone Encounter (Signed)
Pt c/o prod cough (white) for 1 week - Hoarseness - Sinus and chest congestion - SOB about the same - Denies fever or wheezing.  Pt left to go out of town this am  Requesting abx (Doxycycline).  Call pt on cell [phone].  Pharmacy - [facility] [phone].  Please advise.  Sent to doc of day Allergies  Allergen Reactions  . Celebrex [Celecoxib] Anaphylaxis  . Nsaids Anaphylaxis  . Rofecoxib Anaphylaxis  . Codeine Nausea And Vomiting    syncope  . Delsym [Dextromethorphan Polistirex Er]     GI upset  . Hydrocodone     unknown  . Morphine And Related     unknown  . Phenergan [Promethazine Hcl]     unknown  . Propoxyphene And Methadone     unknown  . Quinolones     unknown  . Sulfa Antibiotics Itching  . Tramadol Hcl Nausea Only

## 2013-04-05 NOTE — Telephone Encounter (Signed)
lmomtcb x1 for pt 

## 2013-04-06 NOTE — Telephone Encounter (Signed)
Called to inform pt of below.  Received same msg as below.  WCB

## 2013-04-07 NOTE — Telephone Encounter (Signed)
atc pt received same message wcb

## 2013-04-08 NOTE — Telephone Encounter (Signed)
ATC pt stating same message. Will sign off message

## 2013-04-20 ENCOUNTER — Ambulatory Visit: Payer: Medicare Other | Admitting: Pulmonary Disease

## 2013-04-22 ENCOUNTER — Ambulatory Visit (INDEPENDENT_AMBULATORY_CARE_PROVIDER_SITE_OTHER): Payer: Medicare Other | Admitting: Podiatrist

## 2013-04-22 ENCOUNTER — Encounter: Payer: Self-pay | Admitting: Podiatrist

## 2013-04-22 VITALS — BP 129/55 | HR 79 | Resp 16 | Ht 60.0 in | Wt 292.0 lb

## 2013-04-22 DIAGNOSIS — B351 Tinea unguium: Secondary | ICD-10-CM

## 2013-04-22 DIAGNOSIS — M79609 Pain in unspecified limb: Secondary | ICD-10-CM

## 2013-04-22 DIAGNOSIS — L02619 Cutaneous abscess of unspecified foot: Secondary | ICD-10-CM

## 2013-04-22 MED ORDER — DOXYCYCLINE HYCLATE 100 MG PO TABS: 100.0000 mg | ORAL_TABLET | Freq: Two times a day (BID) | ORAL | Status: DC

## 2013-04-22 NOTE — Progress Notes (Signed)
HPI:  Patient presents today for follow up of foot and nail care. Patient continues to be diabetic. She also has some redness on bilateral legs and states that she cellulitis frequently. Patient states that she normally starts doxycycline for 10 days and this clears it up Objective:  Patients chart is reviewed.  Neurovascular status unchanged.  Patients nails are thickened, discolored, dystrophic, friable and brittle with yellow-brown discoloration. Patient subjectively relates they are painful with shoes and with ambulation.  Light redness is noted at the ankles and anterior legs bilaterally And does appear to be early cellulitis  Assessment:  Symptomatic onychomycosis, cellulitis  Plan:  Discussed treatment options and alternatives.  The symptomatic toenails were debrided through manual an mechanical means without complication.  Return appointment recommended at routine intervals of 3 months  I did also put her on doxycycline for 10 days and recommended she followup with her primary care physician

## 2013-04-22 NOTE — Patient Instructions (Signed)

## 2013-04-26 ENCOUNTER — Telehealth: Payer: Self-pay | Admitting: Pulmonary Disease

## 2013-04-26 MED ORDER — TIOTROPIUM BROMIDE MONOHYDRATE 18 MCG IN CAPS: 18.0000 ug | ORAL_CAPSULE | Freq: Every day | RESPIRATORY_TRACT | Status: DC

## 2013-04-26 MED ORDER — FLUTICASONE-SALMETEROL 250-50 MCG/DOSE IN AEPB: 1.0000 | INHALATION_SPRAY | Freq: Two times a day (BID) | RESPIRATORY_TRACT | Status: DC

## 2013-04-26 NOTE — Telephone Encounter (Signed)
Pt aware samples will be sent to [facility]. We did not have any samples of nasonex. Nothing further needed

## 2013-04-26 NOTE — Telephone Encounter (Signed)
The patient is wanting samples of Advair 250 , Spiriva and nasonex .

## 2013-04-28 ENCOUNTER — Telehealth: Payer: Self-pay

## 2013-04-28 ENCOUNTER — Ambulatory Visit: Payer: Medicare Other | Admitting: Pulmonary Disease

## 2013-04-28 ENCOUNTER — Encounter: Payer: Self-pay | Admitting: Pulmonary Disease

## 2013-04-28 ENCOUNTER — Ambulatory Visit (INDEPENDENT_AMBULATORY_CARE_PROVIDER_SITE_OTHER): Payer: Medicare Other | Admitting: Pulmonary Disease

## 2013-04-28 VITALS — BP 138/66 | HR 72 | Ht 60.0 in | Wt 298.8 lb

## 2013-04-28 DIAGNOSIS — J479 Bronchiectasis, uncomplicated: Secondary | ICD-10-CM

## 2013-04-28 DIAGNOSIS — J411 Mucopurulent chronic bronchitis: Secondary | ICD-10-CM

## 2013-04-28 DIAGNOSIS — R05 Cough: Secondary | ICD-10-CM

## 2013-04-28 DIAGNOSIS — K219 Gastro-esophageal reflux disease without esophagitis: Secondary | ICD-10-CM

## 2013-04-28 MED ORDER — FLUTICASONE-SALMETEROL 250-50 MCG/DOSE IN AEPB: 1.0000 | INHALATION_SPRAY | Freq: Two times a day (BID) | RESPIRATORY_TRACT | Status: DC

## 2013-04-28 MED ORDER — TIOTROPIUM BROMIDE MONOHYDRATE 18 MCG IN CAPS: 18.0000 ug | ORAL_CAPSULE | Freq: Every day | RESPIRATORY_TRACT | Status: DC

## 2013-04-28 NOTE — Progress Notes (Signed)
Subjective:    Patient ID: Teresa Krueger, female    DOB: [birth year], 79 y.o.   MRN: [id number]  Synopsis: Teresa Krueger first saw the [facility] pulmonary clinic in July 2013 for cough. She has a one pack per day smoking history x30 years and quit in the 1990s. She had simple spirometry in July 2013 which showed no evidence of COPD. She has a history of childhood pertussis but a 2010 CT scan of her chest showed no evidence of bronchiectasis. She has postnasal drip and gastroesophageal reflux disease with Barrett's esophagus.  HPI   10/3 ROV -- [name] says that she's had a very productive cough lately has been quite frequent. After she saw the last time she started taking Mucinex and Chlor-Trimeton. She says that the Chlor-Trimeton actually worked very well to make her cough to a but she did note some dryness in her nose with that. She was able to tolerate the dryness from the Chlor-Trimeton that when she use Mucinex she said that made her feel "way too dry". She's not had fevers chills or increasing shortness of breath lately. She had an EGD performed by her gastroenterologist recently which showed Barrett's esophagus.  05/05/12 ROV -- Unfortunately Teresa Krueger is just not getting any better.  Since our last visit she has had two courses of doxycycline yet her cough persists.  She is producing thick yellow sputum.  The doxycycline helped both times but the symptoms are persisting.  She is using her inhalers, flutter valve, and saline therapy as prescribed but she doesn't like the way the saline rinses make her feel ("too dry").  She says that the chlortrimeton helps.  She is not having fevers or weight loss.  She has been wheezing more lately.  Delsym bothered her stomach.  05/19/2012 ROV --   Teresa Krueger has been doing well since her last visit. She's completed the prednisone and amoxicillin and she says that her sputum production has decreased. She is still making some small bits of sputum every day  but it has improved since last time. She does not have a fever and she has not been experiencing shortness of breath. She continues to use her Spiriva and Advair on a daily basis. She is continuing to use the Flonase. She again brings up the issue of gastroesophageal reflux disease and she notes that she has had increasing hoarseness in the past year associated with her worsening cough. Notably she does have a new diagnosis of Barrett's esophagus made several months ago by Dr. Mechele Collin here in [place].  06/30/2012 ROV -- Teresa Krueger has actually been doing much better since we last saw her. It seems like she is finally over her flair bronchiectasis as her sputum production has calmed down. She continues to have a daily cough that persists all day long. She states that she has noticed that the cough is much more significant after she eats dinner in the evenings and then after she lays down. She continues to not have any sensation of heartburn. Her shortness of breath is at baseline. She is currently taking Tessalon for cough and congestion which helps some but occasionally upsets her stomach.  09/15/2012 ROV -- Teresa Krueger returns to follow up today and states that in the last few weeks she has been coughing up a little bit more sputum.  It is clear to yellow.  This is associated with with increasing sinus congestion more on the right than left.  She has noticed some "scabs" coming out from her  notes.  She has been using tussin for the cough and mucus.  She sometimes uses Halls cough drops with lemon and this has been working well for her cough.  Her cough is worse in the evenings after dinner and lasts until 3AM or so.  She continues to use her flutter valve.  12/08/2012 ROV -- Teresa Krueger says she is doing "extra good" since the last visit. Her cough is minimal. She thinks that receiving a TDap vaccine was associated with improvement in her cough. She went to the beach and had basically no cough at all. She has used doxycycline  1 since the last visit. This was for increasing chest congestion and mucus production. She has minimal shortness of breath but her activity is basically nothing because of knee pain.  04/28/2013 ROV >  Teresa Krueger has been doing well since the last visit despite one episode of bronchitis.  She was having some mucus production and wheezing so our office called in prednisone and doxycycline.  Right now she is having a lot of dry mouth since taking the antibiotics.  Breathin gis doing well lately.  Remains dyspnic with activity because of inactivity but it has not worsened since the last visit.  Her reflux is doing OK right now. She went to ENT a few weeks ago because of a sore throat.  She had a laryngoscopy and was told that eerything looked good.  She is having more acid reflux at night.  She has been having some GI issues.    Past Medical History  Diagnosis Date  . Mini stroke   . Asthma   . Diabetes mellitus   . Hypertension   . Breast cancer, left   . Lymphedema     Review of Systems  Constitutional: Negative for fever, chills and diaphoresis.  HENT: Negative for congestion, postnasal drip and rhinorrhea.   Respiratory: Positive for cough. Negative for chest tightness, shortness of breath and stridor.   Cardiovascular: Negative for chest pain and leg swelling.      Objective:   Physical Exam  Filed Vitals:   04/28/13 1147  BP: 138/66  Pulse: 72  Height: 5' (1.524 m)  Weight: 298 lb 12.8 oz (135.535 kg)  SpO2: 96%   Gen: morbidly obese, wheelchair bound HEENT: NCAT, OP clear PULM: Mild exp wheeze LUL, otherwise clear CV: RRR, no mgr, no JVD or AB: BS+, soft, nontender, no hsm Ext: warm, trace edema, no clubbing, no cyanosis  04/2012 CT chest >> no emphysema, there is some bronchiectasis in the RML and Lingula ([place]aid read) 04/2012 CT sinuses >> no significant signs of sinusitis     Assessment & Plan:   Chronic bronchitis This has been a stable interval for [name].     Plan: -continue advair and spiriva -flu shot is up to date  Bronchiectasis Again, stable interval outside of one flare treated recently with doxycycline and prednisone. She usually responds well to this regimen.  Plan: -When necessary doxycycline prescription given  Cough This is due primarily to acid reflux and repeated coughing. We discussed strategies for voice rest and keeping her larynx moist during the day today.  Plan: -See gastroesophageal reflux  GERD (gastroesophageal reflux disease) This continues to be a problem for her. I agree with the recommendations by Dr. Willeen Cass with otolaryngology.  Plan: -Try 40 of omeprazole in the evenings -If that doesn't work try 40 of omeprazole in the morning and Pepcid or Zantac in the evening    Updated Medication List Outpatient  Encounter Prescriptions as of 04/28/2013  Medication Sig  . acetaminophen (TYLENOL) 325 MG tablet Take 650 mg by mouth 3 (three) times daily as needed.   Marland Kitchen albuterol (PROAIR HFA) 108 (90 BASE) MCG/ACT inhaler Inhale 2 puffs into the lungs every 6 (six) hours as needed.  . ALPRAZolam (XANAX) 0.5 MG tablet Take 0.5 mg by mouth 2 (two) times daily as needed.  . calcium-vitamin D (OSCAL WITH D) 500-200 MG-UNIT per tablet Take 1 tablet by mouth daily.  . fluticasone (FLONASE) 50 MCG/ACT nasal spray Place 2 sprays into the nose daily.  . Fluticasone-Salmeterol (ADVAIR) 250-50 MCG/DOSE AEPB Inhale 1 puff into the lungs every 12 (twelve) hours.  Marland Kitchen guaiFENesin-dextromethorphan (ROBITUSSIN DM) 100-10 MG/5ML syrup Take 5 mLs by mouth 3 (three) times daily as needed.  . insulin lispro protamine-insulin lispro (HUMALOG 75/25) (75-25) 100 UNIT/ML SUSP Take as directed  . losartan (COZAAR) 25 MG tablet Take 25 mg by mouth daily.  . Multiple Vitamin (MULTIVITAMIN) capsule Take 1 capsule by mouth daily.  Marland Kitchen omeprazole (PRILOSEC) 20 MG capsule Take 1 capsule (20 mg total) by mouth 2 (two) times daily.  . potassium chloride  SA (K-DUR,KLOR-CON) 20 MEQ tablet Take 20 mEq by mouth 3 (three) times daily.  . simethicone (MYLICON) 125 MG chewable tablet Chew 125 mg by mouth every 6 (six) hours as needed.  Marland Kitchen spironolactone (ALDACTONE) 25 MG tablet Take 25 mg by mouth daily.  Marland Kitchen tiotropium (SPIRIVA HANDIHALER) 18 MCG inhalation capsule Place 1 capsule (18 mcg total) into inhaler and inhale daily.  Marland Kitchen torsemide (DEMADEX) 10 MG tablet Take 20 mg by mouth daily.  . [DISCONTINUED] bumetanide (BUMEX) 1 MG tablet Take 1 mg by mouth daily.  . [DISCONTINUED] doxycycline (VIBRA-TABS) 100 MG tablet Take 1 tablet (100 mg total) by mouth 2 (two) times daily.  . [DISCONTINUED] doxycycline (VIBRA-TABS) 100 MG tablet Take 1 tablet (100 mg total) by mouth 2 (two) times daily.  . [DISCONTINUED] predniSONE (DELTASONE) 10 MG tablet Take 4 tabs daily x 1 day, 3 tabs daily x 1 day, 2 tab daily x 1 day, 1 tab daily x 1 day, then 1/2 tab daily x 1 day, then stop.

## 2013-04-28 NOTE — Telephone Encounter (Signed)
Message copied by Velvet Bathe on Wed Apr 28, 2013  5:05 PM ------      Message from: Max Fickle B      Created: Wed Apr 28, 2013 12:24 PM       A,            Please let her know that her ONO showed significant hypoxemia.  Please make sure this was performed on CPAP.  If it was, she needs to have 2L bleed in O2 with the CPAP and then a repeat ONO on CPAP and Oxygen.            Thanks,      B ------

## 2013-04-28 NOTE — Assessment & Plan Note (Signed)
This is due primarily to acid reflux and repeated coughing. We discussed strategies for voice rest and keeping her larynx moist during the day today.  Plan: -See gastroesophageal reflux

## 2013-04-28 NOTE — Assessment & Plan Note (Signed)
This continues to be a problem for her. I agree with the recommendations by Dr. Willeen Cass with otolaryngology.  Plan: -Try 40 of omeprazole in the evenings -If that doesn't work try 40 of omeprazole in the morning and Pepcid or Zantac in the evening

## 2013-04-28 NOTE — Telephone Encounter (Signed)
QCalled pt to discuss ONO results, pt denies having an ONO done.  I advised the pt that we would discuss this with BQ and would contact her as soon as we have an explanation for this. Teresa Krueger

## 2013-04-28 NOTE — Assessment & Plan Note (Signed)
Again, stable interval outside of one flare treated recently with doxycycline and prednisone. She usually responds well to this regimen.  Plan: -When necessary doxycycline prescription given

## 2013-04-28 NOTE — Patient Instructions (Signed)
Keep taking your medicines as you are doing   For the acid reflux: -try taking the omeprazole two tablets in the evening -if that doesn't help, try using two omeprazole tablets in the morning and then a pepcid or zantac at night  For the stomach issues: -try drinking Kefir daily -if that doesn't work, use nature made probiotic  We will see you back in 4-6 months

## 2013-04-28 NOTE — Assessment & Plan Note (Signed)
This has been a stable interval for [place].    Plan: -continue advair and spiriva -flu shot is up to date

## 2013-04-29 NOTE — Telephone Encounter (Signed)
I called patient to clarify the confusion

## 2013-06-02 ENCOUNTER — Telehealth: Payer: Self-pay | Admitting: Pulmonary Disease

## 2013-06-02 MED ORDER — TIOTROPIUM BROMIDE MONOHYDRATE 18 MCG IN CAPS: 18.0000 ug | ORAL_CAPSULE | Freq: Every day | RESPIRATORY_TRACT | Status: DC

## 2013-06-02 MED ORDER — FLUTICASONE-SALMETEROL 250-50 MCG/DOSE IN AEPB: 1.0000 | INHALATION_SPRAY | Freq: Two times a day (BID) | RESPIRATORY_TRACT | Status: DC

## 2013-06-02 NOTE — Telephone Encounter (Signed)
No samples of nasonex but will send spiriva and advair along with patient assistance forms. Nothing further needed  

## 2013-07-13 ENCOUNTER — Telehealth: Payer: Self-pay | Admitting: Pulmonary Disease

## 2013-07-13 MED ORDER — FLUTICASONE-SALMETEROL 250-50 MCG/DOSE IN AEPB: 1.0000 | INHALATION_SPRAY | Freq: Two times a day (BID) | RESPIRATORY_TRACT | Status: DC

## 2013-07-13 MED ORDER — TIOTROPIUM BROMIDE MONOHYDRATE 18 MCG IN CAPS: 18.0000 ug | ORAL_CAPSULE | Freq: Every day | RESPIRATORY_TRACT | Status: DC

## 2013-07-13 NOTE — Telephone Encounter (Signed)
Samples given to [facility] to take to [facility] on MOnday. Pt is aware. [place], CMA

## 2013-07-15 ENCOUNTER — Encounter: Payer: Self-pay | Admitting: Podiatrist

## 2013-07-15 ENCOUNTER — Ambulatory Visit (INDEPENDENT_AMBULATORY_CARE_PROVIDER_SITE_OTHER): Payer: Medicare Other | Admitting: Podiatrist

## 2013-07-15 VITALS — BP 149/87 | HR 81 | Resp 16 | Ht 60.0 in | Wt 282.0 lb

## 2013-07-15 DIAGNOSIS — Q828 Other specified congenital malformations of skin: Secondary | ICD-10-CM

## 2013-07-15 DIAGNOSIS — B351 Tinea unguium: Secondary | ICD-10-CM

## 2013-07-15 DIAGNOSIS — E119 Type 2 diabetes mellitus without complications: Secondary | ICD-10-CM

## 2013-07-15 DIAGNOSIS — I798 Other disorders of arteries, arterioles and capillaries in diseases classified elsewhere: Secondary | ICD-10-CM

## 2013-07-15 DIAGNOSIS — M2040 Other hammer toe(s) (acquired), unspecified foot: Secondary | ICD-10-CM

## 2013-07-15 DIAGNOSIS — M79609 Pain in unspecified limb: Secondary | ICD-10-CM

## 2013-07-15 NOTE — Progress Notes (Signed)
  Subjective: Teresa Krueger presents today for diabetic foot and nail care. She denies any new concerns or complaints today. She states she can no longer wear a closed in shoe as her heels are still uncomfortable with any type of pressure posteriorly. She states she continues to swell and has a difficult time finding comfortable shoes. Objective: Peripheral neuropathy continues to be present with decreased sensation to [employer] monofilament at 0/5 sites bilateral. Light touch and vibratory sensation are also decreased bilateral. Vascular exam reveals palpable DP and PT pulses at 1/4 bilateral. Capillary refill time is within normal limits bilateral. The patient has large exostoses on the posterior aspect of bilateral heels consistent with Haglund's deformity. She also has adipose tissue on bilateral ankles laterally. Pre-ulcerative calluses on bilateral heels are present as well as the fifth digit right. The patient's toenails are elongated, dystrophic, and painful. Hammertoe deformity left foot digits 2, 3, 4 also present.  Assessment: Diabetes with neuropathy, porokeratotic lesion x3, hammertoe deformity, symptomatic mycotic toenails Plan: Toenails and porokeratotic lesions were debrided today without complication. No iatrogenic bleeding noted. Patient given instructions for diabetes and foot health and she will continue to be seen back every 3 months for preventative treatment. If any problems arise prior that visit she is instructed to call me immediately.

## 2013-07-19 ENCOUNTER — Telehealth: Payer: Self-pay | Admitting: Pulmonary Disease

## 2013-07-19 NOTE — Telephone Encounter (Signed)
Noted, if she needs anything tell her to let us know

## 2013-07-19 NOTE — Telephone Encounter (Signed)
Spoke with the pt  She states that for the past wk had been coughing up large amounts of yellow sputum and felt bad in general  She had rx for doxy on hand and started this x 2 days ago and is feeling much improved  She had called in for her spouse this am, and so wanted to give BQ update on her  She is currently feeling well and denies any respiratory co's Will forward to BQ as FYI

## 2013-07-22 ENCOUNTER — Ambulatory Visit: Payer: Medicare Other | Admitting: Podiatrist

## 2013-08-05 ENCOUNTER — Telehealth: Payer: Self-pay | Admitting: Pulmonary Disease

## 2013-08-05 MED ORDER — DOXYCYCLINE HYCLATE 100 MG PO TABS: ORAL_TABLET | ORAL | Status: DC

## 2013-08-05 NOTE — Telephone Encounter (Signed)
Offer doxycycline 100 mg, # 8, 2 today then one daily  Might help to take Delsym lor Mucinex-DM as needed

## 2013-08-05 NOTE — Telephone Encounter (Signed)
Spoke with pt. Reports chest congestion, cough, sore throat and wheezing since yesterday. Mucus is green in color. Denies SOB, chest tightness, fever/body aches. Has not tried any OTC medications for symptoms. Flu shot was given on 04/14/13. Would like something called in.  Allergies  Allergen Reactions  . Celebrex [Celecoxib] Anaphylaxis  . Nsaids Anaphylaxis  . Rofecoxib Anaphylaxis  . Codeine Nausea And Vomiting    syncope  . Delsym [Dextromethorphan Polistirex Er]     GI upset  . Hydrocodone     unknown  . Morphine And Related     unknown  . Phenergan [Promethazine Hcl]     unknown  . Propoxyphene And Methadone     unknown  . Quinolones     unknown  . Sulfa Antibiotics Itching  . Tramadol Hcl Nausea Only   Current Outpatient Prescriptions on File Prior to Visit  Medication Sig Dispense Refill  . acetaminophen (TYLENOL) 325 MG tablet Take 650 mg by mouth 3 (three) times daily as needed.       Marland Kitchen albuterol (PROAIR HFA) 108 (90 BASE) MCG/ACT inhaler Inhale 2 puffs into the lungs every 6 (six) hours as needed.      . ALPRAZolam (XANAX) 0.5 MG tablet Take 0.5 mg by mouth 2 (two) times daily as needed.      . calcium-vitamin D (OSCAL WITH D) 500-200 MG-UNIT per tablet Take 1 tablet by mouth daily.      . fluticasone (FLONASE) 50 MCG/ACT nasal spray Place 2 sprays into the nose daily.  48 g  2  . Fluticasone-Salmeterol (ADVAIR) 250-50 MCG/DOSE AEPB Inhale 1 puff into the lungs every 12 (twelve) hours.  28 each  0  . guaiFENesin-dextromethorphan (ROBITUSSIN DM) 100-10 MG/5ML syrup Take 5 mLs by mouth 3 (three) times daily as needed.      . insulin lispro protamine-insulin lispro (HUMALOG 75/25) (75-25) 100 UNIT/ML SUSP Take as directed      . losartan (COZAAR) 25 MG tablet Take 25 mg by mouth daily.      . Multiple Vitamin (MULTIVITAMIN) capsule Take 1 capsule by mouth daily.      Marland Kitchen omeprazole (PRILOSEC) 20 MG capsule Take 1 capsule (20 mg total) by mouth 2 (two) times daily.  60  capsule  5  . potassium chloride SA (K-DUR,KLOR-CON) 20 MEQ tablet Take 20 mEq by mouth 2 (two) times daily.       . simethicone (MYLICON) 235 MG chewable tablet Chew 125 mg by mouth every 6 (six) hours as needed.      Marland Kitchen spironolactone (ALDACTONE) 25 MG tablet Take 25 mg by mouth daily.      Marland Kitchen tiotropium (SPIRIVA HANDIHALER) 18 MCG inhalation capsule Place 1 capsule (18 mcg total) into inhaler and inhale daily.  20 capsule  0  . torsemide (DEMADEX) 10 MG tablet Take 20 mg by mouth daily.       No current facility-administered medications on file prior to visit.    CY - please advise. Thanks.

## 2013-08-05 NOTE — Telephone Encounter (Signed)
Spoke with pt and advised of Dr Janee Morn recommendations.  Doxycycline rx sent to pharmacy.  Pt advised to call out office if no better after current treatment.

## 2013-09-16 ENCOUNTER — Other Ambulatory Visit: Payer: Self-pay

## 2013-09-16 MED ORDER — OMEPRAZOLE 20 MG PO CPDR: 20.0000 mg | DELAYED_RELEASE_CAPSULE | Freq: Two times a day (BID) | ORAL | Status: DC

## 2013-09-21 ENCOUNTER — Telehealth: Payer: Self-pay | Admitting: Pulmonary Disease

## 2013-09-21 NOTE — Telephone Encounter (Signed)
1 sample of spiriva, advair and nasonex given to AC to take to [facility] on 09/27/13  Pt aware 

## 2013-10-14 ENCOUNTER — Ambulatory Visit (INDEPENDENT_AMBULATORY_CARE_PROVIDER_SITE_OTHER): Payer: Medicare Other | Admitting: Podiatrist

## 2013-10-14 VITALS — Resp 16 | Ht 60.0 in | Wt 282.0 lb

## 2013-10-14 DIAGNOSIS — M79609 Pain in unspecified limb: Secondary | ICD-10-CM

## 2013-10-14 DIAGNOSIS — B351 Tinea unguium: Secondary | ICD-10-CM

## 2013-10-14 DIAGNOSIS — E1140 Type 2 diabetes mellitus with diabetic neuropathy, unspecified: Secondary | ICD-10-CM

## 2013-10-14 DIAGNOSIS — E119 Type 2 diabetes mellitus without complications: Secondary | ICD-10-CM

## 2013-10-14 DIAGNOSIS — E1149 Type 2 diabetes mellitus with other diabetic neurological complication: Secondary | ICD-10-CM

## 2013-10-14 DIAGNOSIS — Q828 Other specified congenital malformations of skin: Secondary | ICD-10-CM

## 2013-10-14 NOTE — Progress Notes (Signed)
Subjective: Katianne presents today for diabetic foot and nail care. She denies any new concerns or complaints today. She states she can no longer wear a closed in shoe as her heels are still uncomfortable with any type of pressure posteriorly. She states she continues to swell and has a difficult time finding comfortable shoes.  Objective: Peripheral neuropathy continues to be present with decreased sensation to [employer] monofilament at 0/5 sites bilateral. Light touch and vibratory sensation are also decreased bilateral. Vascular exam reveals palpable DP and PT pulses at 1/4 bilateral. Capillary refill time is within normal limits bilateral. The patient has large exostoses on the posterior aspect of bilateral heels consistent with Haglund's deformity. She also has adipose tissue on bilateral ankles laterally. Pre-ulcerative calluses on bilateral heels are present as well as the fifth digit right. The patient's toenails are elongated, dystrophic, and painful. Hammertoe deformity left foot digits 2, 3, 4 also present.  Assessment: Diabetes with neuropathy, porokeratotic lesion x3, hammertoe deformity, symptomatic mycotic toenails .  Left hallux nails is very thick and dystrophic-- it is bothersome to [place]: Toenails and porokeratotic lesions were debrided today without complication. No iatrogenic bleeding noted. Patient given instructions for diabetes and foot health and she will continue to be seen back every 3 months for preventative treatment. If any problems arise prior that visit she is instructed to call me immediately. Discussed that her left hallux nail would likely stay thick due to the damage to the root-- however we will do our best to keep it comfortable

## 2013-10-20 ENCOUNTER — Ambulatory Visit (INDEPENDENT_AMBULATORY_CARE_PROVIDER_SITE_OTHER): Payer: Medicare Other | Admitting: Pulmonary Disease

## 2013-10-20 ENCOUNTER — Encounter: Payer: Self-pay | Admitting: Pulmonary Disease

## 2013-10-20 VITALS — BP 148/72 | HR 75 | Ht 60.0 in | Wt 302.0 lb

## 2013-10-20 DIAGNOSIS — R05 Cough: Secondary | ICD-10-CM

## 2013-10-20 DIAGNOSIS — J479 Bronchiectasis, uncomplicated: Secondary | ICD-10-CM

## 2013-10-20 DIAGNOSIS — J411 Mucopurulent chronic bronchitis: Secondary | ICD-10-CM

## 2013-10-20 DIAGNOSIS — R059 Cough, unspecified: Secondary | ICD-10-CM

## 2013-10-20 DIAGNOSIS — R079 Chest pain, unspecified: Secondary | ICD-10-CM

## 2013-10-20 MED ORDER — CEFDINIR 300 MG PO CAPS: 300.0000 mg | ORAL_CAPSULE | Freq: Two times a day (BID) | ORAL | Status: DC

## 2013-10-20 NOTE — Patient Instructions (Signed)
Try the [facility] instead of the Advair and let us know if it affects your sleep Take the omnicef as written, take it with yogurt or a probiotic We will see you back in 3-4 months or sooner if needed

## 2013-10-20 NOTE — Progress Notes (Signed)
Subjective:    Patient ID: Teresa Krueger, female    DOB: 19-Dec-1933, 80 y.o.   MRN: [id number]  Synopsis: Teresa Krueger first saw the [facility] pulmonary clinic in July 2013 for cough. She has a one pack per day smoking history x30 years and quit in the 1990s. She had simple spirometry in July 2013 which showed no evidence of COPD. She has a history of childhood pertussis but a 2010 CT scan of her chest showed no evidence of bronchiectasis. She has postnasal drip and gastroesophageal reflux disease with Barrett's esophagus.  HPI   10/20/2013 ROV >> Teresa Krueger has been having some flare ups of her cough lately and has been producing more brown sputum.  For the last four weeks she has coughed mainly in the mornings, sometimes in the night.  Has been coughing hard due to chest congstion.  Having chest pain lately too, left chest mostly.  She has no energy and isn't sleeping well.  She is wondering if the inhalers are contributing to the lack of sleep.  Some dyspnea with activgity (ADL's) but really not that active.  She continue to use and benefit from her Spiriva and Advair.  Past Medical History  Diagnosis Date  . Mini stroke   . Asthma   . Diabetes mellitus   . Hypertension   . Breast cancer, left   . Lymphedema     Review of Systems  Constitutional: Positive for fatigue. Negative for fever, chills and diaphoresis.  HENT: Negative for congestion, postnasal drip and rhinorrhea.   Respiratory: Positive for cough and wheezing. Negative for chest tightness, shortness of breath and stridor.   Cardiovascular: Negative for chest pain and leg swelling.      Objective:   Physical Exam  Filed Vitals:   10/20/13 1232 10/20/13 1234  BP: 148/72   Pulse:  75  Height:  5' (1.524 m)  Weight:  302 lb (136.986 kg)  SpO2:  98%  RA  Gen: morbidly obese, wheelchair bound, no acute distress HEENT: NCAT, OP clear PULM: Mild exp wheeze LUL, otherwise clear CV: RRR, no mgr, no JVD or AB:  BS+, soft, nontender, no hsm Ext: warm, trace edema, no clubbing, no cyanosis  04/2012 CT chest >> no emphysema, there is some bronchiectasis in the RML and Lingula (Teresa Krueger read) 04/2012 CT sinuses >> no significant signs of sinusitis 04/2012 CT chest  [facility] >> no emphysema or nodules, mild RML and lingula bronchiectasis  04/2012 AFB culture x3 negative     Assessment & Plan:   Bronchiectasis Teresa Krueger is having another flare.  She wants to try another antibiotic rather than the doxycycline because she worries she may have developed resistance to it.  Plan: -omnicef for 14 days, take with probiotic -use flutter valve  Cough Due to bronchiectasis but also due to her Acid reflux.  Even when the bronchiectasis is quiescent she continues to have a dry cough every day I think is related to the acid reflux.  Plan: -continue PPI bid -f/u visit with GI medicine  Chronic bronchitis She is worried that the Advair is keeping her up at night.  I explained to her that this is unlikely but we can sample Dulera to see if it makes a difference.  Plan: -Dulera trial -continue Spiriva  Chest pain New problem, worrisome given her considerable coronary artery disease risk factors. Agree with plan for stress test this week    Updated Medication List Outpatient Encounter Prescriptions as of 10/20/2013  Medication Sig  .  acetaminophen (TYLENOL) 325 MG tablet Take 650 mg by mouth 3 (three) times daily as needed.   Marland Kitchen albuterol (PROAIR HFA) 108 (90 BASE) MCG/ACT inhaler Inhale 2 puffs into the lungs every 6 (six) hours as needed.  . ALPRAZolam (XANAX) 0.5 MG tablet Take 0.5 mg by mouth 2 (two) times daily as needed.  . calcium-vitamin D (OSCAL WITH D) 500-200 MG-UNIT per tablet Take 1 tablet by mouth daily.  . fluticasone (FLONASE) 50 MCG/ACT nasal spray Place 2 sprays into the nose daily.  . Fluticasone-Salmeterol (ADVAIR) 250-50 MCG/DOSE AEPB Inhale 1 puff into the lungs every 12 (twelve) hours.  Marland Kitchen  guaiFENesin-dextromethorphan (ROBITUSSIN DM) 100-10 MG/5ML syrup Take 5 mLs by mouth 3 (three) times daily as needed.  . insulin lispro protamine-insulin lispro (HUMALOG 75/25) (75-25) 100 UNIT/ML SUSP Take as directed  . losartan (COZAAR) 25 MG tablet Take 25 mg by mouth daily.  . Multiple Vitamin (MULTIVITAMIN) capsule Take 1 capsule by mouth daily.  Marland Kitchen omeprazole (PRILOSEC) 20 MG capsule Take 1 capsule (20 mg total) by mouth 2 (two) times daily.  . potassium chloride SA (K-DUR,KLOR-CON) 20 MEQ tablet Take 20 mEq by mouth 2 (two) times daily.   . simethicone (MYLICON) 572 MG chewable tablet Chew 125 mg by mouth every 6 (six) hours as needed.  Marland Kitchen spironolactone (ALDACTONE) 25 MG tablet Take 25 mg by mouth daily.  Marland Kitchen tiotropium (SPIRIVA HANDIHALER) 18 MCG inhalation capsule Place 1 capsule (18 mcg total) into inhaler and inhale daily.  Marland Kitchen torsemide (DEMADEX) 10 MG tablet Take 20 mg by mouth daily.  . [DISCONTINUED] doxycycline (VIBRA-TABS) 100 MG tablet Take 2 tablets today the 1 tablet daily until gone

## 2013-10-21 DIAGNOSIS — R079 Chest pain, unspecified: Secondary | ICD-10-CM | POA: Insufficient documentation

## 2013-10-21 NOTE — Assessment & Plan Note (Signed)
Due to bronchiectasis but also due to her Acid reflux.  Even when the bronchiectasis is quiescent she continues to have a dry cough every day I think is related to the acid reflux.  Plan: -continue PPI bid -f/u visit with GI medicine

## 2013-10-21 NOTE — Assessment & Plan Note (Signed)
She is worried that the Advair is keeping her up at night.  I explained to her that this is unlikely but we can sample Dulera to see if it makes a difference.  Plan: -Dulera trial -continue Spiriva

## 2013-10-21 NOTE — Assessment & Plan Note (Addendum)
New problem, worrisome given her considerable coronary artery disease risk factors. Agree with plan for stress test this week

## 2013-10-21 NOTE — Assessment & Plan Note (Signed)
Teresa Krueger is having another flare.  She wants to try another antibiotic rather than the doxycycline because she worries she may have developed resistance to it.  Plan: -omnicef for 14 days, take with probiotic -use flutter valve

## 2013-11-03 ENCOUNTER — Ambulatory Visit: Payer: Self-pay | Admitting: Cardiology

## 2013-11-04 ENCOUNTER — Ambulatory Visit: Payer: Self-pay | Admitting: Cardiology

## 2013-11-17 ENCOUNTER — Telehealth: Payer: Self-pay | Admitting: Pulmonary Disease

## 2013-11-17 NOTE — Telephone Encounter (Signed)
Pt is aware that BQ and Caryl Pina will be in [place] on Tuesday. Samples have been given to [place] to take with her. Nothing further is needed.

## 2013-11-23 DIAGNOSIS — M19019 Primary osteoarthritis, unspecified shoulder: Secondary | ICD-10-CM | POA: Insufficient documentation

## 2013-11-30 DIAGNOSIS — I10 Essential (primary) hypertension: Secondary | ICD-10-CM | POA: Insufficient documentation

## 2013-12-21 DIAGNOSIS — Z86010 Personal history of colonic polyps: Secondary | ICD-10-CM | POA: Insufficient documentation

## 2013-12-21 DIAGNOSIS — Z860101 Personal history of adenomatous and serrated colon polyps: Secondary | ICD-10-CM | POA: Insufficient documentation

## 2013-12-21 DIAGNOSIS — K2270 Barrett's esophagus without dysplasia: Secondary | ICD-10-CM | POA: Insufficient documentation

## 2013-12-27 ENCOUNTER — Telehealth: Payer: Self-pay | Admitting: Pulmonary Disease

## 2013-12-27 NOTE — Telephone Encounter (Signed)
lmomtcb for pt 

## 2013-12-28 ENCOUNTER — Encounter: Payer: Self-pay | Admitting: Pulmonary Disease

## 2013-12-28 NOTE — Telephone Encounter (Signed)
Called and spoke with pt and she stated that she is scheduled for endo and colon on 7/22 and she feels very uneasy about having those done.  She is very worried about being put to sleep to have these.  She wanted to see what BQ thought about her having these done.  She stated that she had the nuclear stress test done and it did show something small but not enough to do anything about at this time.    Pt is also requesting samples of advair 250, spiriva and nasonex.    BQ please advise. Thanks  Allergies  Allergen Reactions  . Celebrex [Celecoxib] Anaphylaxis  . Nsaids Anaphylaxis  . Rofecoxib Anaphylaxis  . Codeine Nausea And Vomiting    syncope  . Delsym [Dextromethorphan Polistirex Er]     GI upset  . Hydrocodone     unknown  . Morphine And Related     unknown  . Phenergan [Promethazine Hcl]     unknown  . Propoxyphene And Methadone     unknown  . Quinolones     unknown  . Sulfa Antibiotics Itching  . Tramadol Hcl Nausea Only    Current Outpatient Prescriptions on File Prior to Visit  Medication Sig Dispense Refill  . acetaminophen (TYLENOL) 325 MG tablet Take 650 mg by mouth 3 (three) times daily as needed.       Marland Kitchen albuterol (PROAIR HFA) 108 (90 BASE) MCG/ACT inhaler Inhale 2 puffs into the lungs every 6 (six) hours as needed.      . ALPRAZolam (XANAX) 0.5 MG tablet Take 0.5 mg by mouth 2 (two) times daily as needed.      . calcium-vitamin D (OSCAL WITH D) 500-200 MG-UNIT per tablet Take 1 tablet by mouth daily.      . cefdinir (OMNICEF) 300 MG capsule Take 1 capsule (300 mg total) by mouth 2 (two) times daily.  20 capsule  0  . fluticasone (FLONASE) 50 MCG/ACT nasal spray Place 2 sprays into the nose daily.  48 g  2  . Fluticasone-Salmeterol (ADVAIR) 250-50 MCG/DOSE AEPB Inhale 1 puff into the lungs every 12 (twelve) hours.  28 each  0  . guaiFENesin-dextromethorphan (ROBITUSSIN DM) 100-10 MG/5ML syrup Take 5 mLs by mouth 3 (three) times daily as needed.      . insulin  lispro protamine-insulin lispro (HUMALOG 75/25) (75-25) 100 UNIT/ML SUSP Take as directed      . losartan (COZAAR) 25 MG tablet Take 25 mg by mouth daily.      . Multiple Vitamin (MULTIVITAMIN) capsule Take 1 capsule by mouth daily.      Marland Kitchen omeprazole (PRILOSEC) 20 MG capsule Take 1 capsule (20 mg total) by mouth 2 (two) times daily.  60 capsule  5  . potassium chloride SA (K-DUR,KLOR-CON) 20 MEQ tablet Take 20 mEq by mouth 2 (two) times daily.       . simethicone (MYLICON) 124 MG chewable tablet Chew 125 mg by mouth every 6 (six) hours as needed.      Marland Kitchen spironolactone (ALDACTONE) 25 MG tablet Take 25 mg by mouth daily.      Marland Kitchen tiotropium (SPIRIVA HANDIHALER) 18 MCG inhalation capsule Place 1 capsule (18 mcg total) into inhaler and inhale daily.  20 capsule  0  . torsemide (DEMADEX) 10 MG tablet Take 20 mg by mouth daily.       No current facility-administered medications on file prior to visit.

## 2013-12-28 NOTE — Telephone Encounter (Signed)
I called made pt aware of recs. Nothing further needed 

## 2013-12-28 NOTE — Telephone Encounter (Signed)
Her bronchiectasis should not cause a major problem for her procedures, but it is up to the anesthesiologist to determine her overall risk

## 2014-01-10 ENCOUNTER — Telehealth: Payer: Self-pay | Admitting: Pulmonary Disease

## 2014-01-10 MED ORDER — MOMETASONE FUROATE 50 MCG/ACT NA SUSP: 2.0000 | Freq: Every day | NASAL | Status: DC | PRN

## 2014-01-10 MED ORDER — FLUTICASONE-SALMETEROL 250-50 MCG/DOSE IN AEPB: 1.0000 | INHALATION_SPRAY | Freq: Two times a day (BID) | RESPIRATORY_TRACT | Status: DC

## 2014-01-10 MED ORDER — TIOTROPIUM BROMIDE MONOHYDRATE 18 MCG IN CAPS: 18.0000 ug | ORAL_CAPSULE | Freq: Every day | RESPIRATORY_TRACT | Status: DC

## 2014-01-10 NOTE — Telephone Encounter (Signed)
Pt requesting samples of Nasonex, Advair and Spiriva Refills sent to pharmacy of each medications requested One sample each placed up front to be picked up--daughter is going to stop by and pick up. Nothing further needed.

## 2014-01-17 ENCOUNTER — Ambulatory Visit: Payer: Self-pay | Admitting: Unknown Physician Specialty

## 2014-01-20 ENCOUNTER — Ambulatory Visit (INDEPENDENT_AMBULATORY_CARE_PROVIDER_SITE_OTHER): Payer: Medicare Other | Admitting: Podiatrist

## 2014-01-20 DIAGNOSIS — B351 Tinea unguium: Secondary | ICD-10-CM

## 2014-01-20 DIAGNOSIS — E1140 Type 2 diabetes mellitus with diabetic neuropathy, unspecified: Secondary | ICD-10-CM

## 2014-01-20 DIAGNOSIS — Q828 Other specified congenital malformations of skin: Secondary | ICD-10-CM

## 2014-01-20 DIAGNOSIS — M79609 Pain in unspecified limb: Secondary | ICD-10-CM

## 2014-01-20 DIAGNOSIS — M216X9 Other acquired deformities of unspecified foot: Secondary | ICD-10-CM

## 2014-01-20 DIAGNOSIS — M79673 Pain in unspecified foot: Secondary | ICD-10-CM

## 2014-01-20 DIAGNOSIS — E1149 Type 2 diabetes mellitus with other diabetic neurological complication: Secondary | ICD-10-CM

## 2014-01-20 NOTE — Patient Instructions (Signed)

## 2014-01-21 NOTE — Progress Notes (Signed)
Subjective: Teresa Krueger presents today for diabetic foot and nail care. She states her left heel has been hurting her. She has been rubbing on the carpet and states that this makes it feel a little bit better. She states she can no longer wear a closed in shoe as her heels are still uncomfortable with any type of pressure posteriorly. She states she continues to swell and has a difficult time finding comfortable shoes.   Objective: Peripheral neuropathy continues to be present with decreased sensation to [employer] monofilament at 0/5 sites bilateral. Light touch and vibratory sensation are also decreased bilateral. Vascular exam reveals palpable DP and PT pulses at 1/4 bilateral. Capillary refill time is within normal limits bilateral. The patient has large exostoses on the posterior aspect of bilateral heels consistent with Haglund's deformity. She also has adipose tissue on bilateral ankles laterally. Pre-ulcerative calluses on left heel present as well as the fifth digit right. The patient's toenails are elongated, dystrophic, and painful. Hammertoe deformity left foot digits 2, 3, 4 also present.   Assessment: Diabetes with neuropathy, porokeratotic lesion x 2, hammertoe deformity, symptomatic mycotic toenails   Plan: Toenails and porokeratotic lesions were debrided today without complication. No iatrogenic bleeding noted. Patient given instructions for diabetes and foot health and she will continue to be seen back every 3 months for preventative treatment. If any problems arise prior that visit she is instructed to call me immediately.

## 2014-02-07 ENCOUNTER — Telehealth: Payer: Self-pay | Admitting: Pulmonary Disease

## 2014-02-07 MED ORDER — TIOTROPIUM BROMIDE MONOHYDRATE 18 MCG IN CAPS: 18.0000 ug | ORAL_CAPSULE | Freq: Every day | RESPIRATORY_TRACT | Status: DC

## 2014-02-07 MED ORDER — MOMETASONE FUROATE 50 MCG/ACT NA SUSP: 2.0000 | Freq: Every day | NASAL | Status: DC | PRN

## 2014-02-07 NOTE — Telephone Encounter (Signed)
Pt aware. Samples left in [place] office

## 2014-02-07 NOTE — Telephone Encounter (Signed)
ATC line busy 1 sample of spiriva/nasonex Spouse was giving last sample of advair

## 2014-02-10 ENCOUNTER — Ambulatory Visit: Payer: Medicare Other | Admitting: Podiatry

## 2014-02-18 ENCOUNTER — Encounter: Payer: Self-pay | Admitting: Podiatry

## 2014-02-18 ENCOUNTER — Ambulatory Visit (INDEPENDENT_AMBULATORY_CARE_PROVIDER_SITE_OTHER): Payer: Medicare Other | Admitting: Podiatry

## 2014-02-18 DIAGNOSIS — M722 Plantar fascial fibromatosis: Secondary | ICD-10-CM

## 2014-02-18 MED ORDER — TRIAMCINOLONE ACETONIDE 10 MG/ML IJ SUSP
10.0000 mg | Freq: Once | INTRAMUSCULAR | Status: AC
  Administered 2014-02-18: 10 mg

## 2014-02-18 NOTE — Progress Notes (Signed)
Subjective:     Patient ID: Teresa Krueger, female   DOB: 1934-01-09, 80 y.o.   MRN: [id number]  HPI patient presents stating I'm having trouble mostly with his left heel but also slightly with the right and it seems like it's in the back of the heel and it's difficult for me to determine it completely  Review of Systems     Objective:   Physical Exam Neurovascular status unchanged with patient having plantar posterior heel left with inflammation mostly on the central and lateral    Assessment:     Obese female with possible plantar fasciitis which is causing problem versus keratotic lesion formation and inflammation and obesity as factors    Plan:     Careful injection of the area 3 mg Kenalog 5 mg Xylocaine and advised on reduced activity

## 2014-02-24 ENCOUNTER — Encounter: Payer: Self-pay | Admitting: Pulmonary Disease

## 2014-02-24 ENCOUNTER — Ambulatory Visit: Payer: Medicare Other | Admitting: Pulmonary Disease

## 2014-02-24 ENCOUNTER — Ambulatory Visit (INDEPENDENT_AMBULATORY_CARE_PROVIDER_SITE_OTHER): Payer: Medicare Other | Admitting: Pulmonary Disease

## 2014-02-24 VITALS — BP 132/68 | HR 67 | Temp 98.1°F | Resp 18

## 2014-02-24 DIAGNOSIS — J479 Bronchiectasis, uncomplicated: Secondary | ICD-10-CM

## 2014-02-24 DIAGNOSIS — K219 Gastro-esophageal reflux disease without esophagitis: Secondary | ICD-10-CM

## 2014-02-24 NOTE — Patient Instructions (Signed)
Take ocean spray nasal saline mist for your sinus congestion You can take phenylephrine decongestant tablets for increased sinus congestion Stay off spiriva We will see you back in 4 months or sooner if needed

## 2014-02-24 NOTE — Assessment & Plan Note (Signed)
This is been a very stable interval for Teresa Krueger. I think the Spiriva dry powder inhaler was making her cough worse. She seems to be stable on just the Advair alone.  Plan: -Continue Advair alone -Flu shot as it is available -Followup 4 months

## 2014-02-24 NOTE — Assessment & Plan Note (Signed)
Mild presbyesophagus was noted on her barium swallow but otherwise there is no clear restriction. She should continue taking antacid therapy as she is doing now. This does contribute significantly to her cough.

## 2014-02-24 NOTE — Progress Notes (Signed)
Subjective:    Patient ID: Teresa Krueger, female    DOB: Nov 20, 1933, 80 y.o.   MRN: [id number]  Synopsis: zita ozimek first saw the [facility] pulmonary clinic in July 2013 for cough. She has a one pack per day smoking history x30 years and quit in the 1990s. She had simple spirometry in July 2013 which showed no evidence of COPD. She has a history of childhood pertussis but a 2010 CT scan of her chest showed no evidence of bronchiectasis. She has postnasal drip and gastroesophageal reflux disease with Barrett's esophagus.  HPI  02/24/2014 ROV > Sab[place] has been doing OK lately and feels like the last three weeks have been doing really well.  She had been coughing a lot for about a month in the summer but it has improved.  She wonders if the Spiriva was making her throat irritated or cough.  She was having trouble with hoarseness with this.  She really didn't have much trouble breathing. She had a stress test and apparently there was a small area of ischemia but it was felt ot be old and so she has not had a catheterization.  She was scheduled to have an endoscopy (EGD and colonoscopy) but they decided to forgoe it given her comorbid illnesses.  Apparently she had a narrowing of her esophagus. She has had some sinus congestion and eye drainage.    Past Medical History  Diagnosis Date  . Mini stroke   . Asthma   . Diabetes mellitus   . Hypertension   . Breast cancer, left   . Lymphedema     Review of Systems  Constitutional: Negative for fever, chills, diaphoresis and fatigue.  HENT: Negative for congestion, postnasal drip and rhinorrhea.   Respiratory: Negative for cough, chest tightness, shortness of breath, wheezing and stridor.   Cardiovascular: Negative for chest pain and leg swelling.      Objective:   Physical Exam  Filed Vitals:   02/24/14 1504  BP: 132/68  Pulse: 67  Temp: 98.1 F (36.7 C)  TempSrc: Oral  Resp: 18  SpO2: 97%  RA  Gen: morbidly obese,  wheelchair bound, no acute distress HEENT: NCAT, OP clear PULM: Clear to auscultation bilaterally CV: RRR, no mgr, no JVD or AB: BS+, soft, nontender, no hsm Ext: warm, trace edema, no clubbing, no cyanosis  04/2012 CT chest >> no emphysema, there is some bronchiectasis in the RML and Lingula (Ed Rayson read) 04/2012 CT sinuses >> no significant signs of sinusitis 04/2012 CT chest [facility] >> no emphysema or nodules, mild RML and lingula bronchiectasis  04/2012 AFB culture x3 negative     Assessment & Plan:   Bronchiectasis This is been a very stable interval for [place]. I think the Spiriva dry powder inhaler was making her cough worse. She seems to be stable on just the Advair alone.  Plan: -Continue Advair alone -Flu shot as it is available -Followup 4 months  GERD (gastroesophageal reflux disease) Mild presbyesophagus was noted on her barium swallow but otherwise there is no clear restriction. She should continue taking antacid therapy as she is doing now. This does contribute significantly to her cough.    Updated Medication List Outpatient Encounter Prescriptions as of 02/24/2014  Medication Sig  . acetaminophen (TYLENOL) 325 MG tablet Take 650 mg by mouth 3 (three) times daily as needed.   Marland Kitchen albuterol (PROAIR HFA) 108 (90 BASE) MCG/ACT inhaler Inhale 2 puffs into the lungs every 6 (six) hours as needed.  Marland Kitchen  ALPRAZolam (XANAX) 0.5 MG tablet Take 0.5 mg by mouth 2 (two) times daily as needed.  . calcium-vitamin D (OSCAL WITH D) 500-200 MG-UNIT per tablet Take 1 tablet by mouth daily.  . fluticasone (FLONASE) 50 MCG/ACT nasal spray Place 2 sprays into the nose daily.  . Fluticasone-Salmeterol (ADVAIR) 250-50 MCG/DOSE AEPB Inhale 1 puff into the lungs every 12 (twelve) hours.  Marland Kitchen guaiFENesin-dextromethorphan (ROBITUSSIN DM) 100-10 MG/5ML syrup Take 5 mLs by mouth 3 (three) times daily as needed.  . insulin lispro protamine-insulin lispro (HUMALOG 75/25) (75-25) 100 UNIT/ML SUSP Take as  directed  . lidocaine (LIDODERM) 5 % Place onto the skin.  Marland Kitchen losartan (COZAAR) 25 MG tablet Take 25 mg by mouth daily.  . Multiple Vitamin (MULTIVITAMIN) capsule Take 1 capsule by mouth daily.  Marland Kitchen omeprazole (PRILOSEC) 20 MG capsule Take 1 capsule (20 mg total) by mouth 2 (two) times daily.  . potassium chloride SA (K-DUR,KLOR-CON) 20 MEQ tablet Take 20 mEq by mouth 2 (two) times daily.   Marland Kitchen spironolactone (ALDACTONE) 25 MG tablet Take 25 mg by mouth daily.  Marland Kitchen torsemide (DEMADEX) 10 MG tablet Take 20 mg by mouth daily.  . simethicone (MYLICON) 956 MG chewable tablet Chew 125 mg by mouth every 6 (six) hours as needed.  . tiotropium (SPIRIVA HANDIHALER) 18 MCG inhalation capsule Place 1 capsule (18 mcg total) into inhaler and inhale daily.  . [DISCONTINUED] cefdinir (OMNICEF) 300 MG capsule Take 1 capsule (300 mg total) by mouth 2 (two) times daily.  . [DISCONTINUED] mometasone (NASONEX) 50 MCG/ACT nasal spray Place 2 sprays into the nose daily as needed.

## 2014-03-04 ENCOUNTER — Other Ambulatory Visit: Payer: Self-pay

## 2014-03-04 MED ORDER — FLUTICASONE PROPIONATE 50 MCG/ACT NA SUSP: 2.0000 | Freq: Every day | NASAL | Status: DC

## 2014-03-21 ENCOUNTER — Other Ambulatory Visit: Payer: Self-pay | Admitting: Emergency Medicine

## 2014-03-21 MED ORDER — OMEPRAZOLE 20 MG PO CPDR: 20.0000 mg | DELAYED_RELEASE_CAPSULE | Freq: Two times a day (BID) | ORAL | Status: DC

## 2014-03-29 ENCOUNTER — Telehealth: Payer: Self-pay | Admitting: Pulmonary Disease

## 2014-03-29 MED ORDER — TIOTROPIUM BROMIDE MONOHYDRATE 18 MCG IN CAPS: 18.0000 ug | ORAL_CAPSULE | Freq: Every day | RESPIRATORY_TRACT | Status: DC

## 2014-03-29 MED ORDER — FLUTICASONE-SALMETEROL 250-50 MCG/DOSE IN AEPB: 1.0000 | INHALATION_SPRAY | Freq: Two times a day (BID) | RESPIRATORY_TRACT | Status: DC

## 2014-03-29 NOTE — Telephone Encounter (Signed)
Pt wanting to pick up samples for Advair, Spiriva and Nasonex Only have samples of Spiriva and Advair to offer-- no Nasonex available Pt to pick up samples in [facility] tomorrow 03/30/14 Samples documented.   Pt is wanting to know if she would benefit from getting the Prevnar-13?

## 2014-03-30 ENCOUNTER — Ambulatory Visit: Payer: Self-pay | Admitting: Internal Medicine

## 2014-03-30 NOTE — Telephone Encounter (Signed)
Yes she should have this

## 2014-03-30 NOTE — Telephone Encounter (Signed)
LMTCB

## 2014-03-31 NOTE — Telephone Encounter (Signed)
Called and spoke with pt and she stated that she went to her doctor at [facility] clinic and she was told not to take the prevnar 13 vaccine.  They told her that they are not giving out this vaccine due to too many deaths from pts that have taken it.  Pt wanted BQ to be aware of this and that she did not take this.  Will forward to BQ to make him aware.

## 2014-04-15 ENCOUNTER — Telehealth: Payer: Self-pay | Admitting: Pulmonary Disease

## 2014-04-15 NOTE — Telephone Encounter (Signed)
LMTCB

## 2014-04-18 NOTE — Telephone Encounter (Signed)
Pt is requesting to pick up a new flutter valve.  She's had hers since 2012 and it is not making the flutter in her chest anymore.  She has adjusted the bottom to make sure it's not just on the wrong setting.  Pt is requesting to pick this up from the [place] office on 11/4.  She is also requesting spiriva, nasonex, and advair samples.  Since this is technically a prescription, is it ok to give her a new one?  Thanks!

## 2014-04-18 NOTE — Telephone Encounter (Signed)
Patient calling back.  [phone]

## 2014-04-20 NOTE — Telephone Encounter (Signed)
Pt aware that BQ will be in office tomorrow to ok flutter valve, re-requested samples.

## 2014-04-20 NOTE — Telephone Encounter (Signed)
Patient calling back [phone]

## 2014-04-21 NOTE — Telephone Encounter (Signed)
Per BQ, ok to replace old flutter valve.  Pt aware that we will be bringing that out to her on Tuesday.  Nothing further needed.  Keeping in my in-basket as a reminder to pack this next week.

## 2014-04-27 ENCOUNTER — Ambulatory Visit: Payer: Medicare Other | Admitting: Podiatry

## 2014-04-28 ENCOUNTER — Other Ambulatory Visit: Payer: Self-pay

## 2014-04-28 MED ORDER — FLUTTER DEVI: Status: DC

## 2014-04-28 NOTE — Telephone Encounter (Signed)
Samples and flutter device picked up.  Nothing further needed.

## 2014-05-09 ENCOUNTER — Ambulatory Visit (INDEPENDENT_AMBULATORY_CARE_PROVIDER_SITE_OTHER): Payer: Medicare Other | Admitting: Podiatry

## 2014-05-09 DIAGNOSIS — M79673 Pain in unspecified foot: Secondary | ICD-10-CM

## 2014-05-09 DIAGNOSIS — B351 Tinea unguium: Secondary | ICD-10-CM

## 2014-05-09 NOTE — Progress Notes (Signed)
Presents today chief complaint of painful elongated toenails.  Objective: Pulses are palpable bilateral nails are thick, yellow dystrophic onychomycosis and painful palpation.   Assessment: Onychomycosis with pain in limb.  Plan: Treatment of nails in thickness and length as covered service secondary to pain.  

## 2014-06-01 ENCOUNTER — Telehealth: Payer: Self-pay | Admitting: Pulmonary Disease

## 2014-06-01 NOTE — Telephone Encounter (Signed)
Teresa Pique do you have any of these samples in  [place] for this pt?

## 2014-06-02 NOTE — Telephone Encounter (Signed)
Spoke with Teresa Krueger and let him know samples are available of Advair 250-50 and Spiriva for him and Mrs. Better. We don't have any samples of Nasonex so rx will be sent to pharmacy. They will come by on Monday to pick up samples also asked that she bring her flutter valve for instructions. Patient verbalizes understanding nothing further needed.

## 2014-06-14 ENCOUNTER — Telehealth: Payer: Self-pay | Admitting: Pulmonary Disease

## 2014-06-14 MED ORDER — DOXYCYCLINE HYCLATE 100 MG PO TABS: 100.0000 mg | ORAL_TABLET | Freq: Two times a day (BID) | ORAL | Status: DC

## 2014-06-14 NOTE — Telephone Encounter (Signed)
Called and spoke to pt. Pt c/o rhinorrhea, hoarseness, prod cough with yellow mucus x 3 days. Pt denies change in SOB, CP/tightness, f/c/s. Pt denies taking anything OTC. Pt last seen on 02/24/14.   BQ please advise.   Allergies  Allergen Reactions  . Celebrex [Celecoxib] Anaphylaxis  . Nsaids Anaphylaxis  . Rofecoxib Anaphylaxis  . Codeine Nausea And Vomiting    syncope  . Delsym [Dextromethorphan Polistirex Er]     GI upset  . Hydrocodone     unknown  . Morphine And Related     unknown  . Phenergan [Promethazine Hcl]     unknown  . Propoxyphene     unknown  . Quinolones     unknown  . Sulfa Antibiotics Itching  . Tramadol Hcl Nausea Only

## 2014-06-14 NOTE — Telephone Encounter (Signed)
doxycyline 100mg  po bid x 7 days, with probiotic

## 2014-06-14 NOTE — Telephone Encounter (Signed)
Pt aware that Rx sent to pharmacy. Will let us know if Rx is too expensive. Nothing further needed.

## 2014-07-05 ENCOUNTER — Telehealth: Payer: Self-pay | Admitting: *Deleted

## 2014-07-05 NOTE — Telephone Encounter (Signed)
Received fax from [facility] requesting a new prescription for Doxycycline. Ok to refill?

## 2014-07-06 MED ORDER — DOXYCYCLINE HYCLATE 100 MG PO TABS: 100.0000 mg | ORAL_TABLET | Freq: Two times a day (BID) | ORAL | Status: DC

## 2014-07-06 NOTE — Telephone Encounter (Signed)
Rx has been refilled. Nothing further is needed.

## 2014-07-06 NOTE — Telephone Encounter (Signed)
yes

## 2014-07-26 ENCOUNTER — Ambulatory Visit: Payer: Self-pay | Admitting: Internal Medicine

## 2014-07-29 ENCOUNTER — Telehealth: Payer: Self-pay | Admitting: Pulmonary Disease

## 2014-07-29 NOTE — Telephone Encounter (Signed)
Please request results thanks

## 2014-07-29 NOTE — Telephone Encounter (Signed)
Spoke with pt's, aware we have only 1 nasonex, 1 advair 250, and 2 spiriva hh samples.  These will be given to [facility] to take to [place] on Tuesday.    Also, will forward to BQ to review ct chest that pt had which showed lung nodules.  This was done at [facility].  Let me know if you cannot access this through PACS and I'll request the results.  Thanks!!

## 2014-08-10 ENCOUNTER — Encounter: Payer: Self-pay | Admitting: Podiatry

## 2014-08-10 ENCOUNTER — Ambulatory Visit (INDEPENDENT_AMBULATORY_CARE_PROVIDER_SITE_OTHER): Payer: Medicare Other | Admitting: Podiatry

## 2014-08-10 DIAGNOSIS — M79673 Pain in unspecified foot: Secondary | ICD-10-CM

## 2014-08-10 DIAGNOSIS — Q828 Other specified congenital malformations of skin: Secondary | ICD-10-CM | POA: Diagnosis not present

## 2014-08-10 DIAGNOSIS — B351 Tinea unguium: Secondary | ICD-10-CM | POA: Diagnosis not present

## 2014-08-10 DIAGNOSIS — E1140 Type 2 diabetes mellitus with diabetic neuropathy, unspecified: Secondary | ICD-10-CM | POA: Diagnosis not present

## 2014-08-10 NOTE — Progress Notes (Signed)
She presents today as a diabetic with a chief complaint of painful elongated toenails 1 through 5 bilateral and painful calluses to the plantar aspect of the bilateral foot.  Objective: Pulses are palpable bilateral. Edema bilateral. Nails are thick yellow dystrophic onychomycotic and painful bilateral. She has porokeratosis and calluses to the plantar and plantar medial aspect of the bilateral foot.  Assessment: Diabetes mellitus with a history of pain secondary to onychomycosis and porokeratosis bilateral.  Plan: Debridement of all reactive hyperkeratoses and poor keratotic lesions.

## 2014-08-29 ENCOUNTER — Telehealth: Payer: Self-pay | Admitting: Pulmonary Disease

## 2014-08-29 NOTE — Telephone Encounter (Signed)
Pt aware samples will be taken to [facility] this afternoon.  Nothing further needed.

## 2014-09-13 ENCOUNTER — Encounter: Payer: Self-pay | Admitting: Pulmonary Disease

## 2014-09-13 ENCOUNTER — Ambulatory Visit (INDEPENDENT_AMBULATORY_CARE_PROVIDER_SITE_OTHER): Payer: Medicare Other | Admitting: Pulmonary Disease

## 2014-09-13 VITALS — BP 130/72 | HR 77 | Temp 98.0°F | Resp 16

## 2014-09-13 DIAGNOSIS — J479 Bronchiectasis, uncomplicated: Secondary | ICD-10-CM

## 2014-09-13 DIAGNOSIS — R918 Other nonspecific abnormal finding of lung field: Secondary | ICD-10-CM | POA: Insufficient documentation

## 2014-09-13 DIAGNOSIS — J411 Mucopurulent chronic bronchitis: Secondary | ICD-10-CM

## 2014-09-13 NOTE — Assessment & Plan Note (Signed)
Continue Advair as detailed above.

## 2014-09-13 NOTE — Assessment & Plan Note (Signed)
She was noted to have multiple pulmonary nodules on her recent CT angiogram of her chest. None of these are larger than 6 mm. She has a heavy smoking history but has been greater than 30 years and she quit smoking. I agree with the plan for a one-year follow-up CT scan in February 2017.

## 2014-09-13 NOTE — Progress Notes (Signed)
Subjective:    Patient ID: Teresa Krueger, female    DOB: 10/22/1933, 81 y.o.   MRN: [id number]  Synopsis: kailey esquilin first saw the [facility] pulmonary clinic in July 2013 for cough. She has a one pack per day smoking history x30 years and quit in the 1990s. She had simple spirometry in July 2013 which showed no evidence of COPD. She has a history of childhood pertussis but a 2010 CT scan of her chest showed no evidence of bronchiectasis. She has postnasal drip and gastroesophageal reflux disease with Barrett's esophagus.  HPI Chief Complaint  Patient presents with  . Follow-up    Pt had ct chest last month.  Pt c/o sob with exertion.  has stopped spiriva since last visit.      Deyci has been doin OK except for bad arthritis of her knees which ahs been terribly painful for her lately.  She has been seen by a number of doctors recently and she has really struggled with this problem.  She has struggled with allergies a lot of the medications they have tried.   She had a CT angiogram of her chest recently which was negative for PE but showed some small 44mm nodules.  I also saw bronchiectasis. Her cough has improved as have the mucus production have resolved and she stopped the spriva.  She is using Advair once per day.  Past Medical History  Diagnosis Date  . Mini stroke   . Asthma   . Diabetes mellitus   . Hypertension   . Breast cancer, left   . Lymphedema     Review of Systems  Constitutional: Negative for fever, chills, diaphoresis and fatigue.  HENT: Negative for congestion, postnasal drip and rhinorrhea.   Respiratory: Negative for cough, chest tightness, shortness of breath, wheezing and stridor.   Cardiovascular: Negative for chest pain and leg swelling.      Objective:   Physical Exam Filed Vitals:   09/13/14 1440  BP: 130/72  Pulse: 77  Temp: 98 F (36.7 C)  TempSrc: Oral  Resp: 16  SpO2: 96%  RA  Gen: morbidly obese, wheelchair bound, no acute  distress HEENT: NCAT, OP clear PULM: Clear to auscultation bilaterally CV: RRR, no mgr, no JVD or AB: BS+, soft, nontender, no hsm Ext: warm, trace edema, no clubbing, no cyanosis  04/2012 CT chest >> no emphysema, there is some bronchiectasis in the RML and Lingula (Bryauna Byrum read) 04/2012 CT sinuses >> no significant signs of sinusitis 04/2012 CT chest [facility] >> no emphysema or nodules, mild RML and lingula bronchiectasis  04/2012 AFB culture x3 negative February 2016 CT angiogram chest> mild lingular bronchiectasis, scattered pulmonary nodules bilaterally, largest is 6 mm, no pulmonary embolism   Today we reviewed the images of the CT scan of her chest from February 2016 in clinic together.     Assessment & Plan:   Bronchiectasis This has been a stable interval for Jozey and her mild bronchiectasis. She has not had an exacerbation recently. She has been well controlled on Advair.  Plan: -Continue Advair as she is doing   Chronic bronchitis Continue Advair as detailed above.   Multiple pulmonary nodules She was noted to have multiple pulmonary nodules on her recent CT angiogram of her chest. None of these are larger than 6 mm. She has a heavy smoking history but has been greater than 30 years and she quit smoking. I agree with the plan for a one-year follow-up CT scan in February  2017.     Updated Medication List Outpatient Encounter Prescriptions as of 09/13/2014  Medication Sig  . acetaminophen (TYLENOL) 325 MG tablet Take 650 mg by mouth 3 (three) times daily as needed.   Marland Kitchen albuterol (PROAIR HFA) 108 (90 BASE) MCG/ACT inhaler Inhale 2 puffs into the lungs every 6 (six) hours as needed.  . ALPRAZolam (XANAX) 0.5 MG tablet Take 0.5 mg by mouth 2 (two) times daily as needed.  Marland Kitchen alum & mag hydroxide-simeth (MAALOX/MYLANTA) 200-200-20 MG/5ML suspension Take 15 mLs by mouth every 6 (six) hours as needed for indigestion or heartburn.  . calcium-vitamin D (OSCAL WITH D) 500-200  MG-UNIT per tablet Take 1 tablet by mouth daily.  . fluticasone (FLONASE) 50 MCG/ACT nasal spray Place 2 sprays into both nostrils daily.  . Fluticasone-Salmeterol (ADVAIR) 250-50 MCG/DOSE AEPB Inhale 1 puff into the lungs every 12 (twelve) hours.  Marland Kitchen guaiFENesin-dextromethorphan (ROBITUSSIN DM) 100-10 MG/5ML syrup Take 5 mLs by mouth 3 (three) times daily as needed.  . insulin lispro protamine-insulin lispro (HUMALOG 75/25) (75-25) 100 UNIT/ML SUSP Take as directed  . lidocaine (LIDODERM) 5 % Place onto the skin.  Marland Kitchen losartan (COZAAR) 25 MG tablet Take 25 mg by mouth daily.  . Multiple Vitamin (MULTIVITAMIN) capsule Take 1 capsule by mouth daily.  Marland Kitchen omeprazole (PRILOSEC) 20 MG capsule Take 1 capsule (20 mg total) by mouth 2 (two) times daily.  . potassium chloride SA (K-DUR,KLOR-CON) 20 MEQ tablet Take 20 mEq by mouth 2 (two) times daily.   Marland Kitchen Respiratory Therapy Supplies (FLUTTER) DEVI Use as directed.  Marland Kitchen spironolactone (ALDACTONE) 25 MG tablet Take 25 mg by mouth daily.  . sucralfate (CARAFATE) 1 G tablet Take 1 g by mouth 3 (three) times daily.  Marland Kitchen torsemide (DEMADEX) 10 MG tablet Take 20 mg by mouth daily.  . [DISCONTINUED] doxycycline (VIBRA-TABS) 100 MG tablet Take 1 tablet (100 mg total) by mouth 2 (two) times daily.  . [DISCONTINUED] simethicone (MYLICON) 546 MG chewable tablet Chew 125 mg by mouth every 6 (six) hours as needed.  . [DISCONTINUED] tiotropium (SPIRIVA HANDIHALER) 18 MCG inhalation capsule Place 1 capsule (18 mcg total) into inhaler and inhale daily.

## 2014-09-13 NOTE — Assessment & Plan Note (Signed)
This has been a stable interval for Teresa Krueger and her mild bronchiectasis. She has not had an exacerbation recently. She has been well controlled on Advair.  Plan: -Continue Advair as she is doing

## 2014-09-13 NOTE — Patient Instructions (Signed)
Keep taking the medications as you are doing We will see you back in 6 months or sooner if needed

## 2014-10-07 ENCOUNTER — Other Ambulatory Visit: Payer: Self-pay | Admitting: Internal Medicine

## 2014-10-07 DIAGNOSIS — R918 Other nonspecific abnormal finding of lung field: Secondary | ICD-10-CM

## 2014-11-09 ENCOUNTER — Telehealth: Payer: Self-pay | Admitting: Pulmonary Disease

## 2014-11-09 NOTE — Telephone Encounter (Signed)
Spoke with pt. Advised her that we can't supply her with samples on a regular basis. Samples are not to used this way. She agreed.

## 2014-11-14 ENCOUNTER — Ambulatory Visit (INDEPENDENT_AMBULATORY_CARE_PROVIDER_SITE_OTHER): Payer: Medicare Other | Admitting: Podiatry

## 2014-11-14 DIAGNOSIS — E1140 Type 2 diabetes mellitus with diabetic neuropathy, unspecified: Secondary | ICD-10-CM | POA: Diagnosis not present

## 2014-11-14 DIAGNOSIS — B351 Tinea unguium: Secondary | ICD-10-CM

## 2014-11-14 DIAGNOSIS — M79673 Pain in unspecified foot: Secondary | ICD-10-CM | POA: Diagnosis not present

## 2014-11-14 DIAGNOSIS — Q828 Other specified congenital malformations of skin: Secondary | ICD-10-CM | POA: Diagnosis not present

## 2014-11-14 NOTE — Progress Notes (Signed)
She presents today as a diabetic with a chief complaint of painful elongated toenails 1 through 5 bilateral and painful calluses to the plantar aspect of the bilateral foot.  Objective: Pulses are palpable bilateral. Edema bilateral. Nails are thick yellow dystrophic onychomycotic and painful bilateral. She has porokeratosis and calluses to the plantar and plantar medial aspect of the bilateral foot.  Assessment: Diabetes mellitus with a history of pain secondary to onychomycosis and porokeratosis bilateral.  Plan: Debridement of all reactive hyperkeratoses and poor keratotic lesions.

## 2015-01-17 ENCOUNTER — Ambulatory Visit (INDEPENDENT_AMBULATORY_CARE_PROVIDER_SITE_OTHER): Payer: Medicare Other | Admitting: Podiatry

## 2015-01-17 DIAGNOSIS — B351 Tinea unguium: Secondary | ICD-10-CM

## 2015-01-17 DIAGNOSIS — M79673 Pain in unspecified foot: Secondary | ICD-10-CM | POA: Diagnosis not present

## 2015-01-17 NOTE — Progress Notes (Signed)
Subjective: 81 y.o. returns the office today for painful, elongated, thickened toenails which she is unable to trim herself. Denies any redness or drainage around the nails. Denies any acute changes since last appointment and no new complaints today. Denies any systemic complaints such as fevers, chills, nausea, vomiting.   Objective: AAO 3, NAD DP/PT pulses palpable, CRT less than 3 seconds  Nails hypertrophic, dystrophic, elongated, brittle, discolored 10. There is tenderness overlying the nails 1-5 bilaterally. There is no surrounding erythema or drainage along the nail sites. No open lesions or pre-ulcerative lesions are identified. No other areas of tenderness bilateral lower extremities. No overlying edema, erythema, increased warmth. No pain with calf compression, swelling, warmth, erythema.  Assessment: Patient presents with symptomatic onychomycosis  Plan: -Treatment options including alternatives, risks, complications were discussed -Nails sharply debrided 10 without complication/bleeding. -Discussed daily foot inspection. If there are any changes, to call the office immediately.  -Follow-up in 3 months or sooner if any problems are to arise. In the meantime, encouraged to call the office with any questions, concerns, changes symptoms.   Celesta Gentile, DPM

## 2015-02-09 ENCOUNTER — Telehealth: Payer: Self-pay | Admitting: Pulmonary Disease

## 2015-02-09 NOTE — Telephone Encounter (Signed)
Spoke with pt. Advised her that we do not have samples at this time. She verbalized understanding. Nothing further was needed.

## 2015-02-17 ENCOUNTER — Encounter: Payer: Medicare Other | Attending: Unknown Physician Specialty | Admitting: *Deleted

## 2015-02-17 ENCOUNTER — Encounter: Payer: Self-pay | Admitting: *Deleted

## 2015-02-17 VITALS — BP 160/62 | Ht 60.0 in

## 2015-02-17 DIAGNOSIS — E119 Type 2 diabetes mellitus without complications: Secondary | ICD-10-CM | POA: Diagnosis present

## 2015-02-17 NOTE — Progress Notes (Signed)
Diabetes Self-Management Education  Visit Type: First/Initial  Appt. Start Time: 1335 Appt. End Time: 1520  02/17/2015  [name], identified by name and date of birth, is a 81 y.o. female with a diagnosis of Diabetes: Type 2.   ASSESSMENT  Blood pressure 160/62, height 5' (1.524 m). There is no weight on file to calculate BMI.      Diabetes Self-Management Education - 02/17/15 1625    Visit Information   Visit Type First/Initial   Initial Visit   Diabetes Type Type 2   Are you currently following a meal plan? No   Are you taking your medications as prescribed? Yes   Date Diagnosed 20 years ago   Health Coping   How would you rate your overall health? Poor   Psychosocial Assessment   Patient Belief/Attitude about Diabetes Motivated to manage diabetes   Self-care barriers Unsteady gait/risk for falls   Self-management support Family;Doctor's office   Other persons present Spouse/SO   Patient Concerns Weight Control;Monitoring;Problem Solving   Special Needs None   Preferred Learning Style Visual;Hands on   Learning Readiness Contemplating   How often do you need to have someone help you when you read instructions, pamphlets, or other written materials from your doctor or pharmacy? 1 - Never   What is the last grade level you completed in school? 29+ years   Complications   Last HgB A1C per patient/outside source 6.4 %  10/18/14   How often do you check your blood sugar? 3-4 times/day   Fasting Blood glucose range (mg/dL) 70-129   Number of hypoglycemic episodes per month 4   Can you tell when your blood sugar is low? Yes   What do you do if your blood sugar is low? treat wtih juice or glucose tablets   Have you had a dilated eye exam in the past 12 months? Yes   Have you had a dental exam in the past 12 months? Yes   Are you checking your feet? No  sees podiatrist every 3 months   Dietary Intake   Breakfast bran flakes or cheerios with honey; boiled egg, open  face cheese sandwich; sausage, egg, toast   Lunch light - sandwich, peanut butter crackers   Dinner stew beef with carrots, potatoes and onions; fruit, cabbage, peas, breen beans, occasional spaghetti, homemade soup   Beverage(s) water, diet soda   Exercise   Exercise Type ADL's   Patient Education   Previous Diabetes Education Yes (please comment)  Pt had classes, 2 Hr Refreshers   Nutrition management  Role of diet in the treatment of diabetes and the relationship between the three main macronutrients and blood glucose level;Reviewed blood glucose goals for pre and post meals and how to evaluate the patients' food intake on their blood glucose level.;Meal timing in regards to the patients' current diabetes medication.   Medications Taught/reviewed insulin injection, site rotation, insulin storage and needle disposal.;Reviewed patients medication for diabetes, action, purpose, timing of dose and side effects.   Monitoring Identified appropriate SMBG and/or A1C goals.   Acute complications Taught treatment of hypoglycemia - the 15 rule.   Chronic complications Relationship between chronic complications and blood glucose control   Individualized Goals (developed by patient)   Reducing Risk Examine blood glucose patterns Prevent diabetes complications Lose weight   Outcomes   Expected Outcomes Demonstrated interest in learning. Expect positive outcomes   Future DMSE 4-6 wks      Individualized Plan for Diabetes Self-Management Training:  Learning Objective:  Patient will have a greater understanding of diabetes self-management. Patient education plan is to attend individual and/or group sessions per assessed needs and concerns.   Plan:   Patient Instructions  Check blood sugars 2 x day before breakfast and before supper every day and at bedtime  Eat 3 meals day,   1-2  snacks a day Space meals 4-6 hours apart Limit desserts/sweets Complete 3 Day Food Record and bring to next  appt Bring blood sugar records to the next appointment Carry fast acting glucose and a snack at all times Return for appointment on:  Thursday Sept 22, 2016 at 3:00 pm with Teresa Krueger (dietitian)   Expected Outcomes:  Demonstrated interest in learning. Expect positive outcomes  Education material provided:  General Meal Planning Guidelines 3 Day Food Record  If problems or questions, patient to contact team via:   Teresa Krueger, [place], [place], CDE [phone]  Future DSME appointment:  March 16, 2015 at 3:00 pm with Teresa Krueger (Dietitian)

## 2015-02-17 NOTE — Patient Instructions (Addendum)
Check blood sugars 2 x day before breakfast and before supper every day and at bedtime  Eat 3 meals day,   1-2  snacks a day Space meals 4-6 hours apart Limit desserts/sweets Complete 3 Day Food Record and bring to next appt Bring blood sugar records to the next appointment Carry fast acting glucose and a snack at all times Return for appointment on:  Thursday Sept 22, 2016 at 3:00 pm with Jaclyn Shaggy (dietitian)

## 2015-03-16 ENCOUNTER — Other Ambulatory Visit: Payer: Self-pay

## 2015-03-16 ENCOUNTER — Inpatient Hospital Stay
Admission: EM | Admit: 2015-03-16 | Discharge: 2015-03-22 | DRG: 243 | Disposition: A | Discharge status: Skilled Nursing Facility | Payer: Medicare Other | Attending: Internal Medicine | Admitting: Internal Medicine

## 2015-03-16 ENCOUNTER — Emergency Department: Payer: Medicare Other

## 2015-03-16 ENCOUNTER — Encounter: Payer: Self-pay | Admitting: *Deleted

## 2015-03-16 ENCOUNTER — Encounter: Payer: Self-pay | Admitting: Dietician

## 2015-03-16 ENCOUNTER — Encounter: Payer: Medicare Other | Attending: Unknown Physician Specialty | Admitting: Dietician

## 2015-03-16 DIAGNOSIS — F419 Anxiety disorder, unspecified: Secondary | ICD-10-CM | POA: Diagnosis not present

## 2015-03-16 DIAGNOSIS — Z9842 Cataract extraction status, left eye: Secondary | ICD-10-CM | POA: Diagnosis not present

## 2015-03-16 DIAGNOSIS — J449 Chronic obstructive pulmonary disease, unspecified: Secondary | ICD-10-CM | POA: Diagnosis present

## 2015-03-16 DIAGNOSIS — Z833 Family history of diabetes mellitus: Secondary | ICD-10-CM | POA: Diagnosis not present

## 2015-03-16 DIAGNOSIS — Z8249 Family history of ischemic heart disease and other diseases of the circulatory system: Secondary | ICD-10-CM

## 2015-03-16 DIAGNOSIS — E11649 Type 2 diabetes mellitus with hypoglycemia without coma: Secondary | ICD-10-CM | POA: Diagnosis not present

## 2015-03-16 DIAGNOSIS — E780 Pure hypercholesterolemia: Secondary | ICD-10-CM | POA: Diagnosis not present

## 2015-03-16 DIAGNOSIS — Z7951 Long term (current) use of inhaled steroids: Secondary | ICD-10-CM

## 2015-03-16 DIAGNOSIS — I808 Phlebitis and thrombophlebitis of other sites: Secondary | ICD-10-CM | POA: Diagnosis not present

## 2015-03-16 DIAGNOSIS — Z79899 Other long term (current) drug therapy: Secondary | ICD-10-CM | POA: Diagnosis not present

## 2015-03-16 DIAGNOSIS — G4733 Obstructive sleep apnea (adult) (pediatric): Secondary | ICD-10-CM | POA: Diagnosis present

## 2015-03-16 DIAGNOSIS — Z888 Allergy status to other drugs, medicaments and biological substances status: Secondary | ICD-10-CM | POA: Diagnosis not present

## 2015-03-16 DIAGNOSIS — Z823 Family history of stroke: Secondary | ICD-10-CM

## 2015-03-16 DIAGNOSIS — I10 Essential (primary) hypertension: Secondary | ICD-10-CM | POA: Diagnosis present

## 2015-03-16 DIAGNOSIS — Z6841 Body Mass Index (BMI) 40.0 and over, adult: Secondary | ICD-10-CM

## 2015-03-16 DIAGNOSIS — E785 Hyperlipidemia, unspecified: Secondary | ICD-10-CM | POA: Diagnosis present

## 2015-03-16 DIAGNOSIS — Z9841 Cataract extraction status, right eye: Secondary | ICD-10-CM

## 2015-03-16 DIAGNOSIS — Z801 Family history of malignant neoplasm of trachea, bronchus and lung: Secondary | ICD-10-CM | POA: Diagnosis not present

## 2015-03-16 DIAGNOSIS — Z853 Personal history of malignant neoplasm of breast: Secondary | ICD-10-CM

## 2015-03-16 DIAGNOSIS — Z96649 Presence of unspecified artificial hip joint: Secondary | ICD-10-CM | POA: Diagnosis not present

## 2015-03-16 DIAGNOSIS — I441 Atrioventricular block, second degree: Secondary | ICD-10-CM | POA: Diagnosis present

## 2015-03-16 DIAGNOSIS — M810 Age-related osteoporosis without current pathological fracture: Secondary | ICD-10-CM | POA: Diagnosis not present

## 2015-03-16 DIAGNOSIS — J984 Other disorders of lung: Secondary | ICD-10-CM | POA: Diagnosis present

## 2015-03-16 DIAGNOSIS — Z886 Allergy status to analgesic agent status: Secondary | ICD-10-CM

## 2015-03-16 DIAGNOSIS — K219 Gastro-esophageal reflux disease without esophagitis: Secondary | ICD-10-CM | POA: Diagnosis present

## 2015-03-16 DIAGNOSIS — Z880 Allergy status to penicillin: Secondary | ICD-10-CM

## 2015-03-16 DIAGNOSIS — Z794 Long term (current) use of insulin: Secondary | ICD-10-CM | POA: Diagnosis not present

## 2015-03-16 DIAGNOSIS — I350 Nonrheumatic aortic (valve) stenosis: Secondary | ICD-10-CM | POA: Diagnosis not present

## 2015-03-16 DIAGNOSIS — Z803 Family history of malignant neoplasm of breast: Secondary | ICD-10-CM

## 2015-03-16 DIAGNOSIS — E162 Hypoglycemia, unspecified: Secondary | ICD-10-CM | POA: Diagnosis present

## 2015-03-16 DIAGNOSIS — Z9010 Acquired absence of unspecified breast and nipple: Secondary | ICD-10-CM | POA: Diagnosis not present

## 2015-03-16 DIAGNOSIS — Z9889 Other specified postprocedural states: Secondary | ICD-10-CM | POA: Diagnosis not present

## 2015-03-16 DIAGNOSIS — M179 Osteoarthritis of knee, unspecified: Secondary | ICD-10-CM | POA: Diagnosis present

## 2015-03-16 DIAGNOSIS — E119 Type 2 diabetes mellitus without complications: Secondary | ICD-10-CM

## 2015-03-16 DIAGNOSIS — G4710 Hypersomnia, unspecified: Secondary | ICD-10-CM | POA: Diagnosis not present

## 2015-03-16 DIAGNOSIS — Z885 Allergy status to narcotic agent status: Secondary | ICD-10-CM | POA: Diagnosis not present

## 2015-03-16 DIAGNOSIS — Z882 Allergy status to sulfonamides status: Secondary | ICD-10-CM

## 2015-03-16 DIAGNOSIS — K2270 Barrett's esophagus without dysplasia: Secondary | ICD-10-CM | POA: Diagnosis present

## 2015-03-16 DIAGNOSIS — J411 Mucopurulent chronic bronchitis: Secondary | ICD-10-CM | POA: Diagnosis present

## 2015-03-16 DIAGNOSIS — K589 Irritable bowel syndrome without diarrhea: Secondary | ICD-10-CM | POA: Diagnosis not present

## 2015-03-16 DIAGNOSIS — Z950 Presence of cardiac pacemaker: Secondary | ICD-10-CM

## 2015-03-16 DIAGNOSIS — J45909 Unspecified asthma, uncomplicated: Secondary | ICD-10-CM | POA: Diagnosis present

## 2015-03-16 DIAGNOSIS — G4730 Sleep apnea, unspecified: Secondary | ICD-10-CM

## 2015-03-16 DIAGNOSIS — Z8673 Personal history of transient ischemic attack (TIA), and cerebral infarction without residual deficits: Secondary | ICD-10-CM

## 2015-03-16 DIAGNOSIS — K210 Gastro-esophageal reflux disease with esophagitis: Secondary | ICD-10-CM | POA: Diagnosis not present

## 2015-03-16 DIAGNOSIS — R55 Syncope and collapse: Secondary | ICD-10-CM

## 2015-03-16 DIAGNOSIS — Z87891 Personal history of nicotine dependence: Secondary | ICD-10-CM | POA: Diagnosis not present

## 2015-03-16 HISTORY — DX: Chronic obstructive pulmonary disease, unspecified: J44.9

## 2015-03-16 HISTORY — DX: Barrett's esophagus without dysplasia: K22.70

## 2015-03-16 HISTORY — DX: Bronchiectasis, uncomplicated: J47.9

## 2015-03-16 HISTORY — DX: Age-related osteoporosis without current pathological fracture: M81.0

## 2015-03-16 HISTORY — DX: Pure hypercholesterolemia, unspecified: E78.00

## 2015-03-16 HISTORY — DX: Unspecified osteoarthritis, unspecified site: M19.90

## 2015-03-16 LAB — CBC WITH DIFFERENTIAL/PLATELET
BASOS ABS: 0 10*3/uL (ref 0–0.1)
BASOS PCT: 0 %
EOS ABS: 0.4 10*3/uL (ref 0–0.7)
EOS PCT: 4 %
HCT: 41.2 % (ref 35.0–47.0)
Hemoglobin: 13.5 g/dL (ref 12.0–16.0)
Lymphocytes Relative: 24 %
Lymphs Abs: 2 10*3/uL (ref 1.0–3.6)
MCH: 29.3 pg (ref 26.0–34.0)
MCHC: 32.8 g/dL (ref 32.0–36.0)
MCV: 89.3 fL (ref 80.0–100.0)
MONO ABS: 1 10*3/uL — AB (ref 0.2–0.9)
Monocytes Relative: 12 %
Neutro Abs: 5 10*3/uL (ref 1.4–6.5)
Neutrophils Relative %: 60 %
PLATELETS: 209 10*3/uL (ref 150–440)
RBC: 4.61 MIL/uL (ref 3.80–5.20)
RDW: 13.9 % (ref 11.5–14.5)
WBC: 8.3 10*3/uL (ref 3.6–11.0)

## 2015-03-16 LAB — BASIC METABOLIC PANEL
ANION GAP: 8 (ref 5–15)
BUN: 40 mg/dL — ABNORMAL HIGH (ref 6–20)
CALCIUM: 9.6 mg/dL (ref 8.9–10.3)
CO2: 25 mmol/L (ref 22–32)
Chloride: 110 mmol/L (ref 101–111)
Creatinine, Ser: 1.37 mg/dL — ABNORMAL HIGH (ref 0.44–1.00)
GFR, EST AFRICAN AMERICAN: 41 mL/min — AB (ref >60)
GFR, EST NON AFRICAN AMERICAN: 36 mL/min — AB (ref >60)
GLUCOSE: 39 mg/dL — AB (ref 65–99)
Potassium: 4.6 mmol/L (ref 3.5–5.1)
SODIUM: 143 mmol/L (ref 135–145)

## 2015-03-16 LAB — URINALYSIS COMPLETE WITH MICROSCOPIC (ARMC ONLY)
BILIRUBIN URINE: NEGATIVE
Bacteria, UA: NONE SEEN
Glucose, UA: NEGATIVE mg/dL
Hgb urine dipstick: NEGATIVE
KETONES UR: NEGATIVE mg/dL
LEUKOCYTES UA: NEGATIVE
Nitrite: NEGATIVE
PH: 5 (ref 5.0–8.0)
Protein, ur: 30 mg/dL — AB
Specific Gravity, Urine: 1.017 (ref 1.005–1.030)

## 2015-03-16 LAB — TROPONIN I: Troponin I: <0.03 ng/mL (ref <0.031)

## 2015-03-16 MED ORDER — ACETAMINOPHEN 325 MG PO TABS
650.0000 mg | ORAL_TABLET | Freq: Four times a day (QID) | ORAL | Status: DC | PRN
  Administered 2015-03-17 – 2015-03-20 (×2): 650 mg via ORAL
  Filled 2015-03-16 (×2): qty 2

## 2015-03-16 MED ORDER — ACETAMINOPHEN 650 MG RE SUPP: 650.0000 mg | Freq: Four times a day (QID) | RECTAL | Status: DC | PRN

## 2015-03-16 MED ORDER — HYDRALAZINE HCL 20 MG/ML IJ SOLN
10.0000 mg | INTRAMUSCULAR | Status: DC | PRN
  Administered 2015-03-16: 10 mg via INTRAVENOUS
  Filled 2015-03-16: qty 1

## 2015-03-16 MED ORDER — ALUM & MAG HYDROXIDE-SIMETH 200-200-20 MG/5ML PO SUSP
30.0000 mL | ORAL | Status: DC | PRN
  Administered 2015-03-21: 30 mL via ORAL
  Filled 2015-03-16: qty 30

## 2015-03-16 MED ORDER — ALPRAZOLAM 0.5 MG PO TABS
0.5000 mg | ORAL_TABLET | Freq: Three times a day (TID) | ORAL | Status: DC | PRN
Start: 2015-03-16 — End: 2015-03-22
  Administered 2015-03-16 – 2015-03-22 (×14): 0.5 mg via ORAL
  Filled 2015-03-16 (×14): qty 1

## 2015-03-16 MED ORDER — HEPARIN SODIUM (PORCINE) 5000 UNIT/ML IJ SOLN
5000.0000 [IU] | Freq: Three times a day (TID) | INTRAMUSCULAR | Status: DC
  Administered 2015-03-16 – 2015-03-20 (×12): 5000 [IU] via SUBCUTANEOUS
  Filled 2015-03-16 (×13): qty 1

## 2015-03-16 MED ORDER — DEXTROSE 50 % IV SOLN
1.0000 | Freq: Once | INTRAVENOUS | Status: AC
  Administered 2015-03-16: 50 mL via INTRAVENOUS

## 2015-03-16 MED ORDER — SODIUM CHLORIDE 0.9 % IJ SOLN
3.0000 mL | Freq: Two times a day (BID) | INTRAMUSCULAR | Status: DC
  Administered 2015-03-17 – 2015-03-19 (×6): 3 mL via INTRAVENOUS

## 2015-03-16 MED ORDER — ONDANSETRON HCL 4 MG/2ML IJ SOLN: 4.0000 mg | Freq: Four times a day (QID) | INTRAMUSCULAR | Status: DC | PRN

## 2015-03-16 MED ORDER — ONDANSETRON HCL 4 MG PO TABS: 4.0000 mg | ORAL_TABLET | Freq: Four times a day (QID) | ORAL | Status: DC | PRN

## 2015-03-16 MED ORDER — DEXTROSE 50 % IV SOLN
INTRAVENOUS | Status: AC
  Administered 2015-03-16: 50 mL via INTRAVENOUS
  Filled 2015-03-16: qty 50

## 2015-03-16 NOTE — H&P (Signed)
[facility] [place] NAME: Teresa Krueger    MR#:  [id number]  DATE OF BIRTH:  1934-05-14   DATE OF ADMISSION:  03/16/2015  PRIMARY CARE PHYSICIAN: Adin Hector, MD   REQUESTING/REFERRING PHYSICIAN: Archie Balboa  CHIEF COMPLAINT:   Chief Complaint  Patient presents with  . Weakness    HISTORY OF PRESENT ILLNESS:  Teresa Krueger  is a 81 y.o. female with a known history of type 2 diabetes insulin requiring, COPD and non-oxygen dependent presenting with near syncope. She describes multiple episodes over the past week have near syncope. Today she is a wheelchair. Lightheaded as if she was going to black out this episode was short-lived. She denied any preceding palpitations shortness of breath chest pain. Of note she does describe dyspnea on exertion with minimal activity for the past week, and denies any orthopnea or paroxysmal nocturnal dyspnea. In the emergency department noted to have Mobitz type II on EKG. She was also noted to be hypoglycemic upon arrival  PAST MEDICAL HISTORY:   Past Medical History  Diagnosis Date  . Mini stroke   . Asthma   . Diabetes mellitus   . Hypertension   . Breast cancer, left   . Lymphedema   . COPD (chronic obstructive pulmonary disease)   . Arthritis   . Bronchiectasis   . Hypercholesterolemia   . Barrett's esophagus   . Osteoporosis     PAST SURGICAL HISTORY:   Past Surgical History  Procedure Laterality Date  . Breast lumpectomy  1999    left  . Mastectomy  1999    left  . Total hip arthroplasty  2001    left  . Gallbladder surgery    . Cataract extraction      bilateral  . Carpal tunnel release      right  . Trigger finger release      bilateral    SOCIAL HISTORY:   Social History  Substance Use Topics  . Smoking status: Former Smoker -- 1.00 packs/day for 30 years    Types: Cigarettes    Quit date: 12/27/1992  . Smokeless tobacco: Never Used  . Alcohol Use: No     FAMILY HISTORY:   Family History  Problem Relation Age of Onset  . Heart disease Father   . Heart disease Mother   . Heart disease Maternal Grandmother   . Heart disease Paternal Grandmother   . Diabetes Paternal Grandmother   . Stroke Maternal Aunt   . Breast cancer Paternal Aunt   . Lung cancer Brother 2    DRUG ALLERGIES:   Allergies  Allergen Reactions  . Aspirin Swelling and Other (See Comments)    Pt states that she has tongue swelling.    . Celebrex [Celecoxib] Anaphylaxis  . Meloxicam Swelling  . Nsaids Anaphylaxis  . Rofecoxib Anaphylaxis  . Tolmetin Anaphylaxis  . Biguanide Copolymer Other (See Comments)    Reaction:  Unknown   . Buprenorphine Hcl Other (See Comments)    Reaction:  Unknown   . Ciprofloxacin Other (See Comments)    Reaction:  Unknown   . Codeine Nausea And Vomiting and Other (See Comments)    Reaction:  Syncope   . Delsym [Dextromethorphan Polistirex Er] Other (See Comments)    Reaction:  GI upset   . Dextromethorphan Hbr Other (See Comments)    Reaction:  GI upset   . Furosemide Other (See Comments)    Reaction:  Unknown   .  Hydrocodone Other (See Comments)    Reaction:  Unknown   . Metformin Other (See Comments)    Reaction:  Unknown   . Morphine And Related Other (See Comments)    Reaction:  Unknown   . Penicillins Swelling and Other (See Comments)    Pt did not answer the additional questions for this medication because she does not remember.    . Pregabalin Other (See Comments)    Reaction:  Unknown   . Promethazine Other (See Comments)    Reaction:  Unknown   . Propoxyphene Other (See Comments)    Reaction:  Unknown   . Quinolones Other (See Comments)    Reaction:  Unknown   . Seroquel [Quetiapine Fumarate] Other (See Comments)    Reaction:  Unknown   . Serotonin Reuptake Inhibitors (Ssris) Other (See Comments)    Reaction:  Unknown   . Sulfa Antibiotics Hives and Itching  . Tramadol Nausea Only  . Ultracet  [Tramadol-Acetaminophen] Other (See Comments)    Reaction:  Unknown     REVIEW OF SYSTEMS:  REVIEW OF SYSTEMS:  CONSTITUTIONAL: Denies fevers, chills, positive fatigue, weakness.  EYES: Denies blurred vision, double vision, or eye pain.  EARS, NOSE, THROAT: Denies tinnitus, ear pain, hearing loss.  RESPIRATORY: denies cough, shortness of breath, wheezing positive dyspnea on exertion CARDIOVASCULAR: Denies chest pain, palpitations, edema.  GASTROINTESTINAL: Denies nausea, vomiting, diarrhea, abdominal pain.  GENITOURINARY: Denies dysuria, hematuria.  ENDOCRINE: Denies nocturia or thyroid problems. HEMATOLOGIC AND LYMPHATIC: Denies easy bruising or bleeding.  SKIN: Denies rash or lesions.  MUSCULOSKELETAL: Denies pain in neck, back, shoulder, knees, hips, or further arthritic symptoms.  NEUROLOGIC: Denies paralysis, paresthesias.  PSYCHIATRIC: Denies anxiety or depressive symptoms. Otherwise full review of systems performed by me is negative.   MEDICATIONS AT HOME:   Prior to Admission medications   Medication Sig Start Date End Date Taking? Authorizing Provider  acetaminophen (TYLENOL) 500 MG tablet Take 500 mg by mouth 3 (three) times daily.   Yes Historical Provider, MD  albuterol (PROVENTIL HFA;VENTOLIN HFA) 108 (90 BASE) MCG/ACT inhaler Inhale 2 puffs into the lungs every 6 (six) hours as needed for wheezing or shortness of breath.   Yes Historical Provider, MD  ALPRAZolam Duanne Moron) 0.5 MG tablet Take 0.5 mg by mouth 3 (three) times daily as needed for anxiety.    Yes Historical Provider, MD  Calcium Carb-Cholecalciferol (CALCIUM 500 +D) 500-400 MG-UNIT TABS Take 1 tablet by mouth daily.   Yes Historical Provider, MD  fluticasone (FLONASE) 50 MCG/ACT nasal spray Place 2 sprays into both nostrils daily. Patient taking differently: Place 2 sprays into both nostrils daily as needed for rhinitis.  03/04/14 08/13/15 Yes Juanito Doom, MD  Fluticasone-Salmeterol (ADVAIR) 250-50 MCG/DOSE  AEPB Inhale 1 puff into the lungs every 12 (twelve) hours. 03/29/14  Yes Juanito Doom, MD  guaiFENesin-dextromethorphan (ROBITUSSIN DM) 100-10 MG/5ML syrup Take 5-10 mLs by mouth 3 (three) times daily as needed for cough.    Yes Historical Provider, MD  insulin lispro protamine-insulin lispro (HUMALOG 75/25) (75-25) 100 UNIT/ML SUSP Inject 20 Units into the skin 2 (two) times daily.    Yes Historical Provider, MD  lidocaine (LIDODERM) 5 % Place 2 patches onto the skin daily. Remove & Discard patch within 12 hours or as directed by MD   Yes Historical Provider, MD  losartan (COZAAR) 25 MG tablet Take 25 mg by mouth daily.   Yes Historical Provider, MD  Multiple Vitamin (MULTIVITAMIN WITH MINERALS) TABS tablet Take 1  tablet by mouth daily.   Yes Historical Provider, MD  nystatin cream (MYCOSTATIN) Apply 1 application topically 2 (two) times daily.   Yes Historical Provider, MD  omeprazole (PRILOSEC) 20 MG capsule Take 1 capsule (20 mg total) by mouth 2 (two) times daily. 03/21/14  Yes Juanito Doom, MD  potassium chloride SA (K-DUR,KLOR-CON) 20 MEQ tablet Take 20 mEq by mouth 3 (three) times daily.    Yes Historical Provider, MD  simvastatin (ZOCOR) 20 MG tablet Take 20 mg by mouth daily.   Yes Historical Provider, MD  spironolactone (ALDACTONE) 25 MG tablet Take 25 mg by mouth daily.   Yes Historical Provider, MD  sucralfate (CARAFATE) 1 G tablet Take 1 g by mouth 4 (four) times daily -  with meals and at bedtime.    Yes Historical Provider, MD  torsemide (DEMADEX) 20 MG tablet Take 20 mg by mouth daily.   Yes Historical Provider, MD      VITAL SIGNS:  Blood pressure 209/119, pulse 90, temperature 97.5 F (36.4 C), temperature source Oral, resp. rate 18, SpO2 97 %.  PHYSICAL EXAMINATION:  VITAL SIGNS: Filed Vitals:   03/16/15 2311  BP: 209/119  Pulse:   Temp:   Resp:    GENERAL:81 y.o.female currently in no acute distress.  HEAD: Normocephalic, atraumatic.  EYES: Pupils equal,  round, reactive to light. Extraocular muscles intact. No scleral icterus.  MOUTH: Moist mucosal membrane. Dentition intact. No abscess noted.  EAR, NOSE, THROAT: Clear without exudates. No external lesions.  NECK: Supple. No thyromegaly. No nodules. No JVD.  PULMONARY: Clear to ascultation, without wheeze rails or rhonci. No use of accessory muscles, Good respiratory effort. good air entry bilaterally CHEST: Nontender to palpation.  CARDIOVASCULAR: S1 and S2. Regular rate and rhythm. No murmurs, rubs, or gallops. 2+ edema. Pedal pulses 2+ bilaterally.  GASTROINTESTINAL: Soft, nontender, nondistended. No masses. Positive bowel sounds. No hepatosplenomegaly.  MUSCULOSKELETAL: No swelling, clubbing, or edema. Passive Range of motion full in all extremities.  NEUROLOGIC: Cranial nerves II through XII are intact. No gross focal neurological deficits. Sensation intact. Reflexes intact.  SKIN: No ulceration, lesions, rashes, or cyanosis. Skin warm and dry. Turgor intact.  PSYCHIATRIC: Mood, affect within normal limits. The patient is awake, alert and oriented x 3. Insight, judgment intact.    LABORATORY PANEL:   CBC  Recent Labs Lab 03/16/15 1922  WBC 8.3  HGB 13.5  HCT 41.2  PLT 209   ------------------------------------------------------------------------------------------------------------------  Chemistries   Recent Labs Lab 03/16/15 1922  NA 143  K 4.6  CL 110  CO2 25  GLUCOSE 39*  BUN 40*  CREATININE 1.37*  CALCIUM 9.6   ------------------------------------------------------------------------------------------------------------------  Cardiac Enzymes  Recent Labs Lab 03/16/15 1922  TROPONINI <0.03   ------------------------------------------------------------------------------------------------------------------  RADIOLOGY:  Dg Chest Portable 1 View  03/16/2015   CLINICAL DATA:  Intermittent shortness of breath. Weakness. Near syncopal episodes for the past few  days.  EXAM: PORTABLE CHEST 1 VIEW  COMPARISON:  03/26/2012.  Chest CTA dated 07/26/2014.  FINDINGS: Borderline enlarged cardiac silhouette. Mildly prominent pulmonary vasculature and and interstitial markings. Thoracic spine degenerative changes. No pleural fluid.  IMPRESSION: 1. No acute abnormality. 2. Borderline cardiomegaly with stable mild chronic pulmonary vascular congestion and minimal chronic interstitial lung disease.   Electronically Signed   By: Claudie Revering M.D.   On: 03/16/2015 19:35    EKG:   Orders placed or performed in visit on 03/16/15  . EKG 12-Lead    IMPRESSION AND PLAN:  81 year old Caucasian female history of type 2 diabetes insulin requiring pending after near syncopal episode.  1. Mobitz type II: Place on telemetry, consult cardiology, avoid AV nodal agents 2. Hypertensive urgency: Add hydralazine continue antihypertensives medications 3. Type 2 diabetes with hypoglycemia: Insulin sliding scale will hold basal insulin for now 4. GERD with esophagitis: Continue PPI therapy 5. Venous thromboembolism prophylactic: Heparin subcutaneous    All the records are reviewed and case discussed with ED provider. Management plans discussed with the patient, family and they are in agreement.  CODE STATUS: Full  TOTAL TIME TAKING CARE OF THIS PATIENT: 45 minutes.    Hower,  Karenann Cai.D on 03/16/2015 at 11:12 PM  Between 7am to 6pm - Pager - [phone]  After 6pm: House Pager: - [facility]  Office  [phone]  CC: Primary care physician; Adin Hector, MD

## 2015-03-16 NOTE — ED Notes (Signed)
Pt reports has been feeling weaker and having near syncopal episodes for past few days. Pain to neck and arm. States left arm feels worse than right. Shortness of breath at times.

## 2015-03-16 NOTE — ED Notes (Signed)
Pt requesting foley catheter for urination but unable to place because no criteria for placement.  Brief placed on pt, pt urinated, pt peri care provided and dry brief placed.

## 2015-03-16 NOTE — ED Notes (Signed)
CBG 146 

## 2015-03-16 NOTE — ED Notes (Signed)
Pt reports weakness, feeling faint, and SOB over past few days.  Pt denies n/v and fever.  Pt reports bilateral arm and neck pain, mild that is intermittent.  Pt NAD at this time.

## 2015-03-16 NOTE — Progress Notes (Signed)
Diabetes Self-Management Education  Visit Type:  Follow-up Appt. Start Time: 14:40 Appt. End Time: 15:40  03/16/2015  [name], identified by name and date of birth, is a 81 y.o. female with a diagnosis of Diabetes:  Marland Kitchen Type 2  ASSESSMENT  There were no vitals taken for this visit. There is no weight on file to calculate BMI.       Diabetes Self-Management Education - 60/63/01 6010    Complications   Last HgB A1C per patient/outside source 6.4 %   How often do you check your blood sugar? 3-4 times/day   Fasting Blood glucose range (mg/dL) 70-129   Postprandial Blood glucose range (mg/dL) 70-129   Have you had a dilated eye exam in the past 12 months? Yes   Have you had a dental exam in the past 12 months? Yes   Are you checking your feet? No   Dietary Intake   Breakfast sausage, 2 eggs, 1 slice cantaloupe, 2 slices toast, margarine, 1 tbsp honey, coffee   Lunch salmon sandwich on white bread, diet M.Dew   Snack (afternoon) water, occasional snack such as cheetos   Dinner 1/2 rack ribs, 1 baked potato with butter/sour cream, 1/2 salad, 1/4 cup baked beans or 4 chicken tenders, crowder peas, cream potatoes, cantaloupe   Snack (evening) vanilla ice cream or cookies or cheetos, water   Exercise   Exercise Type ADL's   Patient Education   Nutrition management  Role of diet in the treatment of diabetes and the relationship between the three main macronutrients and blood glucose level;Food label reading, portion sizes and measuring food.;Carbohydrate counting;Information on hints to eating out and maintain blood glucose control.   Acute complications Taught treatment of hypoglycemia - the 15 rule.     Learning Objective:  Patient will have a greater understanding of diabetes self-management. Patient education plan is to attend individual and/or group sessions per assessed needs and concerns.  Plan:   Patient Instructions  Balance meals with 1-3 oz. protein, 1-2 servings of  starch and free vegetables. Also, can add fruit to any meal. Try thin biscuits (frozen) rather than the regular size. Read labels for carbohydrate; can subtract the fiber. Include a protein food (1oz) + 1 serving of starch for evening snack.   Expected Outcomes:  Demonstrated interest in learning. Expect positive outcomes  Education material provided:Planning a Balanced Meal  If problems or questions, patient to contact team via:  [place] phone number Future DSME appointment: - PRN

## 2015-03-16 NOTE — ED Provider Notes (Signed)
[facility] Emergency Department Provider Note    ____________________________________________  Time seen: 2015  I have reviewed the triage vital signs and the nursing notes.   HISTORY  Chief Complaint Weakness   History limited by: Not Limited   HPI Teresa Krueger is a 81 y.o. female who presents to the emergency department today because of concern for a near syncopal episode. Patient states that she has not been feeling well for the past couple of days. She states she has been feeling weak. She has had feelings of lightheadedness. She has also had some shortness of breath. The patient states that today she felt worse whilst shopping in a [employer]. She states she was going to check out when she felt very lightheaded as if she was to pass out. In addition the patient states that she has been having problems with neck and bilateral upper back pain. She states she does have a history of degenerative disc disease and that does cause pain frequently. Patient does deny any fevers.   Past Medical History  Diagnosis Date  . Mini stroke   . Asthma   . Diabetes mellitus   . Hypertension   . Breast cancer, left   . Lymphedema     Patient Active Problem List   Diagnosis Date Noted  . Multiple pulmonary nodules 09/13/2014  . Chest pain 10/21/2013  . Bronchiectasis 05/19/2012  . Chronic bronchitis 12/31/2011  . Cough 12/31/2011  . GERD (gastroesophageal reflux disease) 12/31/2011  . Diabetes mellitus   . Hypertension     Past Surgical History  Procedure Laterality Date  . Breast lumpectomy  1999    left  . Mastectomy  1999    left  . Total hip arthroplasty  2001    left  . Gallbladder surgery    . Cataract extraction      bilateral  . Carpal tunnel release      right  . Trigger finger release      bilateral    Current Outpatient Rx  Name  Route  Sig  Dispense  Refill  . acetaminophen (TYLENOL) 500 MG tablet   Oral   Take 1 tablet by mouth 3  (three) times daily.         Marland Kitchen albuterol (PROAIR HFA) 108 (90 BASE) MCG/ACT inhaler   Inhalation   Inhale 2 puffs into the lungs every 6 (six) hours as needed.         . ALPRAZolam (XANAX) 0.5 MG tablet   Oral   Take 0.5 mg by mouth 3 (three) times daily as needed.          Marland Kitchen alum & mag hydroxide-simeth (MAALOX/MYLANTA) 200-200-20 MG/5ML suspension   Oral   Take 15 mLs by mouth every 6 (six) hours as needed for indigestion or heartburn.         . calcium-vitamin D (OSCAL WITH D) 500-200 MG-UNIT per tablet   Oral   Take 1 tablet by mouth daily.         . fluticasone (FLONASE) 50 MCG/ACT nasal spray   Each Nare   Place 2 sprays into both nostrils daily.   48 g   2   . Fluticasone-Salmeterol (ADVAIR) 250-50 MCG/DOSE AEPB   Inhalation   Inhale 1 puff into the lungs every 12 (twelve) hours.   1 each   0   . guaiFENesin-dextromethorphan (ROBITUSSIN DM) 100-10 MG/5ML syrup   Oral   Take 5 mLs by mouth 3 (three) times daily  as needed.         . insulin lispro protamine-insulin lispro (HUMALOG 75/25) (75-25) 100 UNIT/ML SUSP   Subcutaneous   Inject 20 Units into the skin 2 (two) times daily with a meal. Take as directed         . lidocaine (LIDODERM) 5 %   Transdermal   Place onto the skin.         Marland Kitchen losartan (COZAAR) 25 MG tablet   Oral   Take 25 mg by mouth daily.         . Multiple Vitamin (MULTIVITAMIN) capsule   Oral   Take 1 capsule by mouth daily.         Marland Kitchen nystatin cream (MYCOSTATIN)   Topical   Apply 1 application topically 2 (two) times daily.         Marland Kitchen omeprazole (PRILOSEC) 20 MG capsule   Oral   Take 1 capsule (20 mg total) by mouth 2 (two) times daily.   60 capsule   5   . potassium chloride SA (K-DUR,KLOR-CON) 20 MEQ tablet   Oral   Take 20 mEq by mouth 3 (three) times daily.          Marland Kitchen Respiratory Therapy Supplies (FLUTTER) DEVI      Use as directed.   1 each   0   . simvastatin (ZOCOR) 20 MG tablet   Oral   Take 20  mg by mouth daily.         Marland Kitchen spironolactone (ALDACTONE) 25 MG tablet   Oral   Take 25 mg by mouth daily.         . sucralfate (CARAFATE) 1 G tablet   Oral   Take 1 g by mouth 4 (four) times daily.          Marland Kitchen torsemide (DEMADEX) 10 MG tablet   Oral   Take 20 mg by mouth daily.           Allergies Aspirin; Celebrex; Meloxicam; Nsaids; Rofecoxib; Tolmetin; Buprenorphine hcl; Ciprofloxacin; Codeine; Delsym; Dextromethorphan hbr; Furosemide; Hydrocodone; Metformin; Morphine and related; Penicillins; Phenergan; Pregabalin; Propoxyphene; Quinolones; Serotonin reuptake inhibitors (ssris); Sulfa antibiotics; Sulfamethoxazole-trimethoprim; Tramadol hcl; and Sulfacetamide sodium  Family History  Problem Relation Age of Onset  . Heart disease Father   . Heart disease Mother   . Heart disease Maternal Grandmother   . Heart disease Paternal Grandmother   . Diabetes Paternal Grandmother   . Stroke Maternal Aunt   . Breast cancer Paternal Aunt   . Lung cancer Brother 58    Social History Social History  Substance Use Topics  . Smoking status: Former Smoker -- 1.00 packs/day for 30 years    Types: Cigarettes    Quit date: 12/27/1992  . Smokeless tobacco: Never Used  . Alcohol Use: No    Review of Systems  Constitutional: Negative for fever. Cardiovascular: Negative for chest pain. Respiratory: positive for shortness of breath. Gastrointestinal: Negative for abdominal pain, vomiting and diarrhea. Genitourinary: Negative for dysuria. Musculoskeletal: Negative for back pain. Skin: Negative for rash. Neurological: Negative for headaches, focal weakness or numbness.  10-point ROS otherwise negative.  ____________________________________________   PHYSICAL EXAM:  VITAL SIGNS: ED Triage Vitals  Enc Vitals Group     BP 03/16/15 1851 165/47 mmHg     Pulse Rate 03/16/15 1851 88     Resp 03/16/15 1851 16     Temp 03/16/15 1851 97.5 F (36.4 C)     Temp Source 03/16/15  1851 Oral  SpO2 03/16/15 1851 96 %     Weight --      Height --      Head Cir --      Peak Flow --      Pain Score 03/16/15 1855 2   Constitutional: Alert and oriented. Appears slightly nervous Eyes: Conjunctivae are normal. PERRL. Normal extraocular movements. ENT   Head: Normocephalic and atraumatic.   Nose: No congestion/rhinnorhea.   Mouth/Throat: Mucous membranes are moist.   Neck: No stridor. Hematological/Lymphatic/Immunilogical: No cervical lymphadenopathy. Cardiovascular: Normal rate, regular rhythm.  No murmurs, rubs, or gallops. Respiratory: Normal respiratory effort without tachypnea nor retractions. Breath sounds are clear and equal bilaterally. No wheezes/rales/rhonchi. Gastrointestinal: Soft and nontender. No distention.  Genitourinary: Deferred Musculoskeletal: Normal range of motion in all extremities. No joint effusions.  No lower extremity tenderness nor edema. Neurologic:  Normal speech and language. No gross focal neurologic deficits are appreciated. Speech is normal.  Skin:  Skin is warm, dry and intact. No rash noted.  ____________________________________________    LABS (pertinent positives/negatives)  Labs Reviewed  CBC WITH DIFFERENTIAL/PLATELET - Abnormal; Notable for the following:    Monocytes Absolute 1.0 (*)    All other components within normal limits  BASIC METABOLIC PANEL - Abnormal; Notable for the following:    Glucose, Bld 39 (*)    BUN 40 (*)    Creatinine, Ser 1.37 (*)    GFR calc non Af Amer 36 (*)    GFR calc Af Amer 41 (*)    All other components within normal limits  URINALYSIS COMPLETEWITH MICROSCOPIC ([facility] ONLY) - Abnormal; Notable for the following:    Color, Urine YELLOW (*)    APPearance CLEAR (*)    Protein, ur 30 (*)    Squamous Epithelial / LPF 0-5 (*)    All other components within normal limits  TROPONIN I     ____________________________________________   EKG  I, Nance Pear, attending  physician, personally viewed and interpreted this EKG  EKG Time: 1859 Rate: 82 Rhythm: NSR Axis: left axis deviation Intervals: qtc 457 QRS: RBBB ST changes: no st elevation Impression: abnormal ekg ____________________________________________    RADIOLOGY  CXR IMPRESSION: 1. No acute abnormality. 2. Borderline cardiomegaly with stable mild chronic pulmonary vascular congestion and minimal chronic interstitial lung disease.  ____________________________________________   PROCEDURES  Procedure(s) performed: None  Critical Care performed: No  ____________________________________________   INITIAL IMPRESSION / ASSESSMENT AND PLAN / ED COURSE  Pertinent labs & imaging results that were available during my care of the patient were reviewed by me and considered in my medical decision making (see chart for details).  Patient presents to the emergency department today because of a near syncopal episode. On exam patient does appear slightly anxious. Blood work was significant for hypoglycemia. Patient is diabetic and did take insulin tonight. Will give amp of D50 and observed.   During the patient's stay here in the emergency department I did note that she was having dropped beats on her monitor. An EKG and rhythm strip were done which continued to show a large proportion of dropped beats. They did appear to have P waves before the dropping. Given the concern for second-degree heart block the patient was admitted to the hospital.  ____________________________________________   FINAL CLINICAL IMPRESSION(S) / ED DIAGNOSES  Final diagnoses:  Near syncope  Hypoglycemia     Nance Pear, MD 03/17/15 1722

## 2015-03-16 NOTE — ED Notes (Signed)
CBG 133 

## 2015-03-16 NOTE — Patient Instructions (Signed)
Balance meals with 1-3 oz. protein, 1-2 servings of starch and free vegetables. Also, can add fruit to any meal. Try thin biscuits (frozen) rather than the regular size. Read labels for carbohydrate; can subtract the fiber. Include a protein food (1oz) + 1 serving of starch for evening snack.

## 2015-03-17 ENCOUNTER — Inpatient Hospital Stay
Admit: 2015-03-17 | Discharge: 2015-03-17 | Disposition: A | Discharge status: Home / Self Care | Payer: Medicare Other | Attending: Internal Medicine | Admitting: Internal Medicine

## 2015-03-17 ENCOUNTER — Inpatient Hospital Stay: Payer: Medicare Other

## 2015-03-17 DIAGNOSIS — G4730 Sleep apnea, unspecified: Secondary | ICD-10-CM

## 2015-03-17 DIAGNOSIS — G4710 Hypersomnia, unspecified: Secondary | ICD-10-CM | POA: Diagnosis present

## 2015-03-17 LAB — CBC
HEMATOCRIT: 38.6 % (ref 35.0–47.0)
Hemoglobin: 12.7 g/dL (ref 12.0–16.0)
MCH: 29.1 pg (ref 26.0–34.0)
MCHC: 32.9 g/dL (ref 32.0–36.0)
MCV: 88.5 fL (ref 80.0–100.0)
Platelets: 154 10*3/uL (ref 150–440)
RBC: 4.37 MIL/uL (ref 3.80–5.20)
RDW: 13.8 % (ref 11.5–14.5)
WBC: 5.3 10*3/uL (ref 3.6–11.0)

## 2015-03-17 LAB — BASIC METABOLIC PANEL
Anion gap: 5 (ref 5–15)
BUN: 34 mg/dL — AB (ref 6–20)
CHLORIDE: 112 mmol/L — AB (ref 101–111)
CO2: 25 mmol/L (ref 22–32)
Calcium: 9.2 mg/dL (ref 8.9–10.3)
Creatinine, Ser: 1.04 mg/dL — ABNORMAL HIGH (ref 0.44–1.00)
GFR calc Af Amer: 58 mL/min — ABNORMAL LOW (ref >60)
GFR, EST NON AFRICAN AMERICAN: 50 mL/min — AB (ref >60)
GLUCOSE: 168 mg/dL — AB (ref 65–99)
POTASSIUM: 5 mmol/L (ref 3.5–5.1)
Sodium: 142 mmol/L (ref 135–145)

## 2015-03-17 LAB — MAGNESIUM: MAGNESIUM: 2.4 mg/dL (ref 1.7–2.4)

## 2015-03-17 LAB — TSH: TSH: 1.364 u[IU]/mL (ref 0.350–4.500)

## 2015-03-17 LAB — GLUCOSE, CAPILLARY
GLUCOSE-CAPILLARY: 137 mg/dL — AB (ref 65–99)
GLUCOSE-CAPILLARY: 173 mg/dL — AB (ref 65–99)
Glucose-Capillary: 146 mg/dL — ABNORMAL HIGH (ref 65–99)
Glucose-Capillary: 133 mg/dL — ABNORMAL HIGH (ref 65–99)
Glucose-Capillary: 162 mg/dL — ABNORMAL HIGH (ref 65–99)
Glucose-Capillary: 178 mg/dL — ABNORMAL HIGH (ref 65–99)
Glucose-Capillary: 201 mg/dL — ABNORMAL HIGH (ref 65–99)
Glucose-Capillary: 156 mg/dL — ABNORMAL HIGH (ref 65–99)

## 2015-03-17 LAB — TROPONIN I
TROPONIN I: 0.03 ng/mL (ref <0.031)
Troponin I: 0.03 ng/mL (ref <0.031)
Troponin I: 0.04 ng/mL — ABNORMAL HIGH (ref <0.031)

## 2015-03-17 MED ORDER — GUAIFENESIN-DM 100-10 MG/5ML PO SYRP: 5.0000 mL | ORAL_SOLUTION | ORAL | Status: DC | PRN

## 2015-03-17 MED ORDER — MOMETASONE FURO-FORMOTEROL FUM 100-5 MCG/ACT IN AERO
2.0000 | INHALATION_SPRAY | Freq: Two times a day (BID) | RESPIRATORY_TRACT | Status: DC
  Administered 2015-03-17 – 2015-03-22 (×10): 2 via RESPIRATORY_TRACT
  Filled 2015-03-17: qty 8.8

## 2015-03-17 MED ORDER — SPIRONOLACTONE 25 MG PO TABS: 25.0000 mg | ORAL_TABLET | Freq: Every day | ORAL | Status: DC

## 2015-03-17 MED ORDER — REGADENOSON 0.4 MG/5ML IV SOLN
0.4000 mg | Freq: Once | INTRAVENOUS | Status: AC
  Administered 2015-03-17: 0.4 mg via INTRAVENOUS
  Filled 2015-03-17: qty 5

## 2015-03-17 MED ORDER — MULTIVITAMINS PO CAPS: 1.0000 | ORAL_CAPSULE | Freq: Every day | ORAL | Status: DC

## 2015-03-17 MED ORDER — ALUM & MAG HYDROXIDE-SIMETH 200-200-20 MG/5ML PO SUSP: 15.0000 mL | Freq: Four times a day (QID) | ORAL | Status: DC | PRN

## 2015-03-17 MED ORDER — INSULIN ASPART 100 UNIT/ML ~~LOC~~ SOLN
0.0000 [IU] | SUBCUTANEOUS | Status: DC
  Administered 2015-03-17: 8 [IU] via SUBCUTANEOUS
  Administered 2015-03-17: 2 [IU] via SUBCUTANEOUS
  Administered 2015-03-18: 8 [IU] via SUBCUTANEOUS
  Administered 2015-03-18: 01:00:00 via SUBCUTANEOUS
  Administered 2015-03-18: 8 [IU] via SUBCUTANEOUS
  Administered 2015-03-19: 4 [IU] via SUBCUTANEOUS
  Administered 2015-03-19: 8 [IU] via SUBCUTANEOUS
  Administered 2015-03-19: 2 [IU] via SUBCUTANEOUS
  Administered 2015-03-20: 4 [IU] via SUBCUTANEOUS
  Filled 2015-03-17: qty 8
  Filled 2015-03-17: qty 12
  Filled 2015-03-17: qty 8
  Filled 2015-03-17: qty 4
  Filled 2015-03-17: qty 2
  Filled 2015-03-17: qty 8
  Filled 2015-03-17 (×2): qty 2
  Filled 2015-03-17: qty 4
  Filled 2015-03-17: qty 2
  Filled 2015-03-17: qty 8

## 2015-03-17 MED ORDER — LOSARTAN POTASSIUM 25 MG PO TABS
25.0000 mg | ORAL_TABLET | Freq: Every day | ORAL | Status: DC
  Administered 2015-03-18 – 2015-03-22 (×4): 25 mg via ORAL
  Filled 2015-03-17 (×4): qty 1

## 2015-03-17 MED ORDER — PANTOPRAZOLE SODIUM 40 MG PO TBEC
40.0000 mg | DELAYED_RELEASE_TABLET | Freq: Every day | ORAL | Status: DC
  Administered 2015-03-17 – 2015-03-22 (×5): 40 mg via ORAL
  Filled 2015-03-17 (×5): qty 1

## 2015-03-17 MED ORDER — ADULT MULTIVITAMIN W/MINERALS CH
1.0000 | ORAL_TABLET | Freq: Every day | ORAL | Status: DC
  Administered 2015-03-18 – 2015-03-22 (×4): 1 via ORAL
  Filled 2015-03-17 (×4): qty 1

## 2015-03-17 MED ORDER — FLUTICASONE PROPIONATE 50 MCG/ACT NA SUSP
2.0000 | Freq: Every day | NASAL | Status: DC
  Administered 2015-03-17 – 2015-03-22 (×5): 2 via NASAL
  Filled 2015-03-17: qty 16

## 2015-03-17 MED ORDER — TECHNETIUM TC 99M SESTAMIBI - CARDIOLITE
33.1870 | Freq: Once | INTRAVENOUS | Status: AC | PRN
  Administered 2015-03-17: 33.187 via INTRAVENOUS

## 2015-03-17 MED ORDER — CALCIUM CARBONATE-VITAMIN D 500-200 MG-UNIT PO TABS
1.0000 | ORAL_TABLET | Freq: Every day | ORAL | Status: DC
  Administered 2015-03-18 – 2015-03-22 (×4): 1 via ORAL
  Filled 2015-03-17 (×4): qty 1

## 2015-03-17 MED ORDER — SUCRALFATE 1 G PO TABS
1.0000 g | ORAL_TABLET | Freq: Four times a day (QID) | ORAL | Status: DC
  Administered 2015-03-17 – 2015-03-22 (×12): 1 g via ORAL
  Filled 2015-03-17 (×17): qty 1

## 2015-03-17 MED ORDER — SIMVASTATIN 20 MG PO TABS
20.0000 mg | ORAL_TABLET | Freq: Every day | ORAL | Status: DC
  Administered 2015-03-18 – 2015-03-22 (×4): 20 mg via ORAL
  Filled 2015-03-17 (×4): qty 1

## 2015-03-17 NOTE — Progress Notes (Signed)
Echocardiogram 2D Echocardiogram has been performed.  Joelene Millin 03/17/2015, 1:00 PM

## 2015-03-17 NOTE — Progress Notes (Signed)
Inpatient Diabetes Program Recommendations  AACE/ADA: New Consensus Statement on Inpatient Glycemic Control (2015)  Target Ranges:  Prepandial:   less than 140 mg/dL      Peak postprandial:   less than 180 mg/dL (1-2 hours)      Critically ill patients:  140 - 180 mg/dL   Review of Glycemic Control  Diabetes history: Type 2 Outpatient Diabetes medications: Novolog 75/25 insulin 16-20 units bid  Current orders for Inpatient glycemic control: none  I met with patient at the bedside.  Although her medication list states she takes Novolog 75/25 insulin 20 units bid, she tells me she only takes 16-18 units 2x/day most days.  She reports taking am insulin around 11am-12pm after she eats breakfast and 6:30-7pm after she eats her supper.  I re-educated her on the need to take her insulin BEFORE she eats.   Inpatient Diabetes Program Recommendations:     1. Please consider checking blood sugars tid and hs while in the hospital 2. Please consider ordering Novolog correction 0-9 units tid (found in manage orders- Glycemic Control order set) 3. Please consider ordering Novolog correction 0-5 units qhs 4. Please consider ordering 1/2 dose of her home insulin Novolog 75/25 10 units bid  Gentry Fitz, RN, [place], [place], CDE Diabetes Coordinator Inpatient Diabetes Program  [phone] (Team Pager) [phone] ([place]) 03/17/2015 1:20 PM

## 2015-03-17 NOTE — Progress Notes (Signed)
Admission skin assessment with Georgian Co, RN

## 2015-03-17 NOTE — Care Management (Signed)
Patient presents form home for weakness and possible syncope.  She has required IV hydralazine x 1 for blood pressure.  Results of myoview pending.  She has motorized scooter chair at home and able to perform her adls.  Her troponins are negative.  Cardiology is involved.  There is concern of second degree heart block on telemetry strips/EKG.

## 2015-03-17 NOTE — Progress Notes (Signed)
Patient ID: Teresa Krueger, female   DOB: 1934/05/06, 81 y.o.   MRN: [id number] SUBJECTIVE:  Admitted with c/o generalized weakness and fatigue over last week or so.  Reports sleepy all the time.  C/o developing pain in both arms radiating up to her neck, though this is not a new symptom.  Has chronic dyspnea.  Developed dizziness; was evaluated in ER, with rhythm strips there showing 2nd degree AVB. Also noted to be hypoglycemic, hypertensive; labs suggest mild dehydration.  Has been having some AM headaches. Longstanding abd discomfort; not clear that this has changed.  Had heart cath 9/15, negative, and CT chest 2/16, with few nodules, as part of eval for dyspnea over last year. ______________________________________________________________________  ROS: Please see HPI; remainder of complete 10 point ROS is negative   Past Medical History  Diagnosis Date  . Mini stroke   . Asthma   . Diabetes mellitus   . Hypertension   . Breast cancer, left   . Lymphedema   . COPD (chronic obstructive pulmonary disease)   . Arthritis   . Bronchiectasis   . Hypercholesterolemia   . Barrett's esophagus   . Osteoporosis     Past Surgical History  Procedure Laterality Date  . Breast lumpectomy  1999    left  . Mastectomy  1999    left  . Total hip arthroplasty  2001    left  . Gallbladder surgery    . Cataract extraction      bilateral  . Carpal tunnel release      right  . Trigger finger release      bilateral     Current facility-administered medications:  .  acetaminophen (TYLENOL) tablet 650 mg, 650 mg, Oral, Q6H PRN **OR** acetaminophen (TYLENOL) suppository 650 mg, 650 mg, Rectal, Q6H PRN, Lytle Butte, MD .  ALPRAZolam Duanne Moron) tablet 0.5 mg, 0.5 mg, Oral, TID PRN, Lytle Butte, MD, 0.5 mg at 03/16/15 2307 .  alum & mag hydroxide-simeth (MAALOX/MYLANTA) 200-200-20 MG/5ML suspension 30 mL, 30 mL, Oral, Q4H PRN, Lytle Butte, MD .  calcium-vitamin D (OSCAL WITH D) 500-200  MG-UNIT per tablet 1 tablet, 1 tablet, Oral, Daily, Lytle Butte, MD .  fluticasone [facility]) 50 MCG/ACT nasal spray 2 spray, 2 spray, Each Nare, Daily, Lytle Butte, MD, 2 spray at 03/17/15 [phone] .  guaiFENesin-dextromethorphan (ROBITUSSIN DM) 100-10 MG/5ML syrup 5 mL, 5 mL, Oral, Q4H PRN, Lytle Butte, MD .  heparin injection 5,000 Units, 5,000 Units, Subcutaneous, 3 times per day, Lytle Butte, MD, 5,000 Units at 03/17/15 0550 .  hydrALAZINE (APRESOLINE) injection 10 mg, 10 mg, Intravenous, Q4H PRN, Lytle Butte, MD, 10 mg at 03/16/15 2311 .  losartan (COZAAR) tablet 25 mg, 25 mg, Oral, Daily, Lytle Butte, MD .  mometasone-formoterol [facility]) 100-5 MCG/ACT inhaler 2 puff, 2 puff, Inhalation, BID, Lytle Butte, MD, 2 puff at 03/17/15 [phone] .  multivitamin with minerals tablet 1 tablet, 1 tablet, Oral, Daily, Lytle Butte, MD .  ondansetron [facility]) tablet 4 mg, 4 mg, Oral, Q6H PRN **OR** ondansetron (ZOFRAN) injection 4 mg, 4 mg, Intravenous, Q6H PRN, Lytle Butte, MD .  pantoprazole (PROTONIX) EC tablet 40 mg, 40 mg, Oral, Daily, Lytle Butte, MD, 40 mg at 03/17/15 4081 .  simvastatin (ZOCOR) tablet 20 mg, 20 mg, Oral, Daily, Lytle Butte, MD .  sodium chloride 0.9 % injection 3 mL, 3 mL, Intravenous, Q12H, Lytle Butte, MD, 3 mL at  03/17/15 0550 .  sucralfate (CARAFATE) tablet 1 g, 1 g, Oral, QID, Lytle Butte, MD, 1 g at 03/17/15 4403  PHYSICAL EXAM:  BP 161/46 mmHg  Pulse 89  Temp(Src) 98.6 F (37 C) (Oral)  Resp 20  Ht 5' (1.524 m)  Wt 134.945 kg (297 lb 8 oz)  BMI 58.10 kg/m2  SpO2 95%  General: morbidly obese  female, in NAD HEENT: PERRL; OP moist without lesions. Neck: supple, trachea midline, no thyromegaly Chest: normal to palpation Lungs: clear bilaterally without retractions or wheezes Cardiovascular: RRR with occ ectopy, distant heart sounds with 2/6 murmur; distal pulses 2+ Abdomen: soft, nontender, nondistended, positive bowel sounds Extremities: no  clubbing, cyanosis. 1+ edema Neuro: alert and oriented, moves all extremities. Pain limits ROM in both arms Derm: no significant rashes or nodules; good skin turgor Lymph: no cervical or supraclavicular lymphadenopathy  Labs and imaging studies were reviewed  ASSESSMENT/PLAN:   1. Near syncope/2nd degree AVB- cardiology has seen; await echo.  Myoview planned, though findings on cath last year suggest thsi will be negative.  Check Mg, TSH. Follow tele 2. Accelerated HTN- adjust meds and follow.  Holding spironolactone given relatively elevated K+ and mild azotemia 3. OSA- check overnight oximetry, given morning h/a, increased snoring (per pt/family).   4. DDD/DJD- pain meds as needed.  Arm/neck pain likely from this.  ANA pending, given rhythm changes, joint pain 5. GERD/IBS- cont home regimen 6. COPD- cont home meds; sig portion of this is due to her morbid obesity and restrictive lung disease, however 7. Anxiety- cont xanax.

## 2015-03-17 NOTE — Progress Notes (Signed)
Pt refuses removal of foley catheter at this time.

## 2015-03-17 NOTE — Consult Note (Signed)
[facility] Consultation Note  Patient ID: Teresa Krueger, MRN: [id number], DOB/AGE: [birth year] 79 y.o. Admit date: 03/16/2015   Date of Consult: 03/17/2015  Primary Physician: Adin Hector, MD Primary Cardiologist: Bluford Kaufmann  Chief Complaint:  Chief Complaint  Patient presents with  . Weakness   Reason for Consult: presyncope with aortic valve stenosis  HPI: 81 y.o. female with known aortic valve stenosis and hypertension with hyperlipidemia who has been on appropriate medication management. The patient also has diabetes for which is been fairly well controlled. Recently the patient has had episodes of bilateral arm pain and weakness with and without physical activity. She also has been having more shortness of breath with physical activity increasing in frequency and intensity over the last several weeks. There is been no evidence of congestive heart failure or lower extremity edema at this time. The patient was at [facility] for which she had some dizziness and weakness and felt like she was going to pass out. When she came to the emergency room she continued to have some of these minimal symptoms with an EKG showing normal sinus rhythm with first-degree AV block and no other issues. There's been no evidence of troponin elevation or myocardial infarction. Telemetry has shown first-degree in degree type I AV block most consistent with block. His never been any apparent higher block seen by telemetry at this time. The patient is at rest in hemodynamically stable with no further evidence of significant symptoms  Past Medical History  Diagnosis Date  . Mini stroke   . Asthma   . Diabetes mellitus   . Hypertension   . Breast cancer, left   . Lymphedema   . COPD (chronic obstructive pulmonary disease)   . Arthritis   . Bronchiectasis   . Hypercholesterolemia   . Barrett's esophagus   . Osteoporosis       Surgical History:  Past Surgical History  Procedure Laterality  Date  . Breast lumpectomy  1999    left  . Mastectomy  1999    left  . Total hip arthroplasty  2001    left  . Gallbladder surgery    . Cataract extraction      bilateral  . Carpal tunnel release      right  . Trigger finger release      bilateral     Home Meds: Prior to Admission medications   Medication Sig Start Date End Date Taking? Authorizing Naz Denunzio  acetaminophen (TYLENOL) 500 MG tablet Take 500 mg by mouth 3 (three) times daily.   Yes Historical Heavyn Yearsley, MD  albuterol (PROVENTIL HFA;VENTOLIN HFA) 108 (90 BASE) MCG/ACT inhaler Inhale 2 puffs into the lungs every 6 (six) hours as needed for wheezing or shortness of breath.   Yes Historical Xzaiver Vayda, MD  ALPRAZolam Duanne Moron) 0.5 MG tablet Take 0.5 mg by mouth 3 (three) times daily as needed for anxiety.    Yes Historical Tyleek Smick, MD  Calcium Carb-Cholecalciferol (CALCIUM 500 +D) 500-400 MG-UNIT TABS Take 1 tablet by mouth daily.   Yes Historical Lemya Greenwell, MD  fluticasone (FLONASE) 50 MCG/ACT nasal spray Place 2 sprays into both nostrils daily. Patient taking differently: Place 2 sprays into both nostrils daily as needed for rhinitis.  03/04/14 08/13/15 Yes Juanito Doom, MD  Fluticasone-Salmeterol (ADVAIR) 250-50 MCG/DOSE AEPB Inhale 1 puff into the lungs every 12 (twelve) hours. 03/29/14  Yes Juanito Doom, MD  guaiFENesin-dextromethorphan (ROBITUSSIN DM) 100-10 MG/5ML syrup Take 5-10 mLs by mouth 3 (three) times  daily as needed for cough.    Yes Historical Svetlana Bagby, MD  insulin lispro protamine-insulin lispro (HUMALOG 75/25) (75-25) 100 UNIT/ML SUSP Inject 20 Units into the skin 2 (two) times daily.    Yes Historical Yuya Vanwingerden, MD  lidocaine (LIDODERM) 5 % Place 2 patches onto the skin daily. Remove & Discard patch within 12 hours or as directed by MD   Yes Historical Jiayi Lengacher, MD  losartan (COZAAR) 25 MG tablet Take 25 mg by mouth daily.   Yes Historical Ting Cage, MD  Multiple Vitamin (MULTIVITAMIN WITH MINERALS) TABS  tablet Take 1 tablet by mouth daily.   Yes Historical Chanda Laperle, MD  nystatin cream (MYCOSTATIN) Apply 1 application topically 2 (two) times daily.   Yes Historical Josefita Weissmann, MD  omeprazole (PRILOSEC) 20 MG capsule Take 1 capsule (20 mg total) by mouth 2 (two) times daily. 03/21/14  Yes Juanito Doom, MD  potassium chloride SA (K-DUR,KLOR-CON) 20 MEQ tablet Take 20 mEq by mouth 3 (three) times daily.    Yes Historical Bertrice Leder, MD  simvastatin (ZOCOR) 20 MG tablet Take 20 mg by mouth daily.   Yes Historical Hareem Surowiec, MD  spironolactone (ALDACTONE) 25 MG tablet Take 25 mg by mouth daily.   Yes Historical Autumn Pruitt, MD  sucralfate (CARAFATE) 1 G tablet Take 1 g by mouth 4 (four) times daily -  with meals and at bedtime.    Yes Historical Sheilia Reznick, MD  torsemide (DEMADEX) 20 MG tablet Take 20 mg by mouth daily.   Yes Historical Judie Hollick, MD    Inpatient Medications:  . calcium-vitamin D  1 tablet Oral Daily  . fluticasone  2 spray Each Nare Daily  . heparin  5,000 Units Subcutaneous 3 times per day  . losartan  25 mg Oral Daily  . mometasone-formoterol  2 puff Inhalation BID  . multivitamin with minerals  1 tablet Oral Daily  . pantoprazole  40 mg Oral Daily  . simvastatin  20 mg Oral Daily  . sodium chloride  3 mL Intravenous Q12H  . spironolactone  25 mg Oral Daily  . sucralfate  1 g Oral QID      Allergies:  Allergies  Allergen Reactions  . Aspirin Swelling and Other (See Comments)    Pt states that she has tongue swelling.    . Celebrex [Celecoxib] Anaphylaxis  . Meloxicam Swelling  . Nsaids Anaphylaxis  . Rofecoxib Anaphylaxis  . Tolmetin Anaphylaxis  . Biguanide Copolymer Other (See Comments)    Reaction:  Unknown   . Buprenorphine Hcl Other (See Comments)    Reaction:  Unknown   . Ciprofloxacin Other (See Comments)    Reaction:  Unknown   . Codeine Nausea And Vomiting and Other (See Comments)    Reaction:  Syncope   . Delsym [Dextromethorphan Polistirex Er] Other (See  Comments)    Reaction:  GI upset   . Dextromethorphan Hbr Other (See Comments)    Reaction:  GI upset   . Furosemide Other (See Comments)    Reaction:  Unknown   . Hydrocodone Other (See Comments)    Reaction:  Unknown   . Metformin Other (See Comments)    Reaction:  Unknown   . Morphine And Related Other (See Comments)    Reaction:  Unknown   . Penicillins Swelling and Other (See Comments)    Pt did not answer the additional questions for this medication because she does not remember.    . Pregabalin Other (See Comments)    Reaction:  Unknown   .  Promethazine Other (See Comments)    Reaction:  Unknown   . Propoxyphene Other (See Comments)    Reaction:  Unknown   . Quinolones Other (See Comments)    Reaction:  Unknown   . Seroquel [Quetiapine Fumarate] Other (See Comments)    Reaction:  Unknown   . Serotonin Reuptake Inhibitors (Ssris) Other (See Comments)    Reaction:  Unknown   . Sulfa Antibiotics Hives and Itching  . Tramadol Nausea Only  . Ultracet [Tramadol-Acetaminophen] Other (See Comments)    Reaction:  Unknown     Social History   Social History  . Marital Status: Married    Spouse Name: N/A  . Number of Children: 1  . Years of Education: N/A   Occupational History  . Retired     worked as a  Customer service manager x 80 yrs   Social History Main Topics  . Smoking status: Former Smoker -- 1.00 packs/day for 30 years    Types: Cigarettes    Quit date: 12/27/1992  . Smokeless tobacco: Never Used  . Alcohol Use: No  . Drug Use: No  . Sexual Activity: Not on file   Other Topics Concern  . Not on file   Social History Narrative     Family History  Problem Relation Age of Onset  . Heart disease Father   . Heart disease Mother   . Heart disease Maternal Grandmother   . Heart disease Paternal Grandmother   . Diabetes Paternal Grandmother   . Stroke Maternal Aunt   . Breast cancer Paternal Aunt   . Lung cancer Brother 8     Review of Systems Positive for  shortness of breath with weakness Negative for: General:  chills, fever, night sweats or weight changes.  Cardiovascular: PND orthopnea syncope dizziness  Dermatological skin lesions rashes Respiratory: Cough congestion Urologic: Frequent urination urination at night and hematuria Abdominal: negative for nausea, vomiting, diarrhea, bright red blood per rectum, melena, or hematemesis Neurologic: negative for visual changes, and/or hearing changes  All other systems reviewed and are otherwise negative except as noted above.  Labs:  Recent Labs  03/16/15 1922  TROPONINI <0.03   Lab Results  Component Value Date   WBC 5.3 03/17/2015   HGB 12.7 03/17/2015   HCT 38.6 03/17/2015   MCV 88.5 03/17/2015   PLT 154 03/17/2015    Recent Labs Lab 03/17/15 0606  NA 142  K 5.0  CL 112*  CO2 25  BUN 34*  CREATININE 1.04*  CALCIUM 9.2  GLUCOSE 168*   No results found for: CHOL, HDL, LDLCALC, TRIG No results found for: DDIMER  Radiology/Studies:  Dg Chest Portable 1 View  03/16/2015   CLINICAL DATA:  Intermittent shortness of breath. Weakness. Near syncopal episodes for the past few days.  EXAM: PORTABLE CHEST 1 VIEW  COMPARISON:  03/26/2012.  Chest CTA dated 07/26/2014.  FINDINGS: Borderline enlarged cardiac silhouette. Mildly prominent pulmonary vasculature and and interstitial markings. Thoracic spine degenerative changes. No pleural fluid.  IMPRESSION: 1. No acute abnormality. 2. Borderline cardiomegaly with stable mild chronic pulmonary vascular congestion and minimal chronic interstitial lung disease.   Electronically Signed   By: Claudie Revering M.D.   On: 03/16/2015 19:35    EKG: Normal sinus rhythm with first-degree AV block  Weights: Filed Weights   03/17/15 0056  Weight: 297 lb 8 oz (134.945 kg)     Physical Exam: Blood pressure 161/46, pulse 89, temperature 98.6 F (37 C), temperature source Oral, resp. rate 20,  height 5' (1.524 m), weight 297 lb 8 oz (134.945 kg), SpO2  95 %. Body mass index is 58.1 kg/(m^2). General: Well developed, well nourished, in no acute distress. Head eyes ears nose throat: Normocephalic, atraumatic, sclera non-icteric, no xanthomas, nares are without discharge. No apparent thyromegaly and/or mass  Lungs: Normal respiratory effort.  no wheezes, no rales, no rhonchi.  Heart: RRR with normal S1 soft S2. 3 out of 6 right upper sternal border   murmur radiating throughout gallop, no rub, PMI is normal size and placement, carotid upstroke normal with  bruit, jugular venous pressure is normal Abdomen: Soft, non-tender,  distended with normoactive bowel sounds. No hepatomegaly. No rebound/guarding. No obvious abdominal masses. Abdominal aorta is normal size without bruit Extremities: No edema. no cyanosis, no clubbing, no ulcers  Peripheral : 2+ bilateral upper extremity pulses, 2+ bilateral femoral pulses, 2+ bilateral dorsal pedal pulse Neuro: Alert and oriented. No facial asymmetry. No focal deficit. Moves all extremities spontaneously. Musculoskeletal: Normal muscle tone without kyphosis Psych:  Responds to questions appropriately with a normal affect.    Assessment: 81 year old female with known aortic valve stenosis mixed hyperlipidemia essential hypertension with acute degree type I AV block and presyncope multifactorial in nature needing further evaluation and treatment options  Plan: 1. Continue angiotensin receptor blocker and hydralazine for hypertension control as necessary 2. Continue serial ECG and enzymes to assess for possible myocardial infarction 3. Echocardiogram for aortic valve stenosis significance causing significant symptoms listed above 4. Continue telemetry to assess for heart block 5. Lexiscan infusion Myoview to assess for possible myocardial ischemia in the setting of symptoms listed above as well as multiple risk factors  Signed, Corey Skains M.D. [facility] 03/17/2015, 7:47 AM

## 2015-03-18 ENCOUNTER — Ambulatory Visit: Payer: Medicare Other

## 2015-03-18 LAB — BASIC METABOLIC PANEL
Anion gap: 7 (ref 5–15)
BUN: 26 mg/dL — AB (ref 6–20)
CHLORIDE: 112 mmol/L — AB (ref 101–111)
CO2: 22 mmol/L (ref 22–32)
CREATININE: 0.98 mg/dL (ref 0.44–1.00)
Calcium: 9.1 mg/dL (ref 8.9–10.3)
GFR calc Af Amer: >60 mL/min (ref >60)
GFR calc non Af Amer: 53 mL/min — ABNORMAL LOW (ref >60)
GLUCOSE: 144 mg/dL — AB (ref 65–99)
POTASSIUM: 4.1 mmol/L (ref 3.5–5.1)
SODIUM: 141 mmol/L (ref 135–145)

## 2015-03-18 LAB — ANA W/REFLEX IF POSITIVE: ANA: NEGATIVE

## 2015-03-18 LAB — GLUCOSE, CAPILLARY
GLUCOSE-CAPILLARY: 114 mg/dL — AB (ref 65–99)
GLUCOSE-CAPILLARY: 125 mg/dL — AB (ref 65–99)
GLUCOSE-CAPILLARY: 213 mg/dL — AB (ref 65–99)
GLUCOSE-CAPILLARY: 217 mg/dL — AB (ref 65–99)
GLUCOSE-CAPILLARY: 81 mg/dL (ref 65–99)
Glucose-Capillary: 139 mg/dL — ABNORMAL HIGH (ref 65–99)

## 2015-03-18 MED ORDER — TECHNETIUM TC 99M SESTAMIBI GENERIC - CARDIOLITE
33.4500 | Freq: Once | INTRAVENOUS | Status: AC | PRN
  Administered 2015-03-18: 33.45 via INTRAVENOUS

## 2015-03-18 NOTE — Progress Notes (Signed)
[facility] Encounter Note  Patient: Teresa Krueger / Admit Date: 03/16/2015 / Date of Encounter: 03/18/2015, 5:27 AM   Subjective: Patient had slight spell of dizziness last night without evidence of syncope or telemetry changes. Blood glucose was 81  Review of Systems: Positive for: Dizziness Negative for: Vision change, hearing change, syncope,  nausea, vomiting,diarrhea, bloody stool, stomach pain, cough, congestion, diaphoresis, urinary frequency, urinary pain,skin lesions, skin rashes Others previously listed  Objective: Telemetry: Normal sinus rhythm Physical Exam: Blood pressure 163/50, pulse 75, temperature 98.1 F (36.7 C), temperature source Oral, resp. rate 20, height 5' (1.524 m), weight 297 lb 8 oz (134.945 kg), SpO2 98 %. Body mass index is 58.1 kg/(m^2). General: Well developed, well nourished, in no acute distress. Head: Normocephalic, atraumatic, sclera non-icteric, no xanthomas, nares are without discharge. Neck: No apparent masses Lungs: Normal respirations with no wheezes, no rhonchi, no rales , no crackles   Heart: Regular rate and rhythm, normal S1 S2, no murmur, no rub, no gallop, PMI is normal size and placement, carotid upstroke normal without bruit, jugular venous pressure normalnor Abdomen: Soft, non-tender, non-distended with normoactive bowel sounds. No hepatosplenomegaly. Abdominal aorta is not heard Extremities: Trace edema, no clubbing, no cyanosis, no ulcers,  Peripheral: 2+ radial, 2+ femoral, 2+ dorsal pedal pulses Neuro: Alert and oriented. Moves all extremities spontaneously. Psych:  Responds to questions appropriately with a normal affect.   Intake/Output Summary (Last 24 hours) at 03/18/15 0527 Last data filed at 03/18/15 0423  Gross per 24 hour  Intake    480 ml  Output   2900 ml  Net  -2420 ml    Inpatient Medications:  . calcium-vitamin D  1 tablet Oral Daily  . fluticasone  2 spray Each Nare Daily  . heparin   5,000 Units Subcutaneous 3 times per day  . insulin aspart  0-24 Units Subcutaneous 6 times per day  . losartan  25 mg Oral Daily  . mometasone-formoterol  2 puff Inhalation BID  . multivitamin with minerals  1 tablet Oral Daily  . pantoprazole  40 mg Oral Daily  . simvastatin  20 mg Oral Daily  . sodium chloride  3 mL Intravenous Q12H  . sucralfate  1 g Oral QID   Infusions:    Labs:  Recent Labs  03/16/15 1922 03/17/15 0606 03/17/15 1153  NA 143 142  --   K 4.6 5.0  --   CL 110 112*  --   CO2 25 25  --   GLUCOSE 39* 168*  --   BUN 40* 34*  --   CREATININE 1.37* 1.04*  --   CALCIUM 9.6 9.2  --   MG  --   --  2.4   No results for input(s): AST, ALT, ALKPHOS, BILITOT, PROT, ALBUMIN in the last 72 hours.  Recent Labs  03/16/15 1922 03/17/15 0606  WBC 8.3 5.3  NEUTROABS 5.0  --   HGB 13.5 12.7  HCT 41.2 38.6  MCV 89.3 88.5  PLT 209 154    Recent Labs  03/16/15 1922 03/17/15 0606 03/17/15 1153 03/17/15 1439  TROPONINI <0.03 0.03 0.04* 0.03   Invalid input(s): POCBNP No results for input(s): HGBA1C in the last 72 hours.   Weights: Filed Weights   03/17/15 0056  Weight: 297 lb 8 oz (134.945 kg)     Radiology/Studies:  Dg Chest Portable 1 View  03/16/2015   CLINICAL DATA:  Intermittent shortness of breath. Weakness. Near syncopal episodes for the  past few days.  EXAM: PORTABLE CHEST 1 VIEW  COMPARISON:  03/26/2012.  Chest CTA dated 07/26/2014.  FINDINGS: Borderline enlarged cardiac silhouette. Mildly prominent pulmonary vasculature and and interstitial markings. Thoracic spine degenerative changes. No pleural fluid.  IMPRESSION: 1. No acute abnormality. 2. Borderline cardiomegaly with stable mild chronic pulmonary vascular congestion and minimal chronic interstitial lung disease.   Electronically Signed   By: Claudie Revering M.D.   On: 03/16/2015 19:35     Assessment and Recommendation  81 y.o. female with known aortic valve stenosis mild in nature with  hypertension essential in nature with hyperlipidemia having dizzy spells and presyncope of unknown etiology without evidence of significant heart failure or LV systolic dysfunction with an echocardiogram showing normal LV function ejection fraction of 60% and no evidence of telemetry changes or primary source of cause of problems. 1. Begin ambulation as able and follow for new symptoms or issues 2. Conclusion of stress test 3. No change in medication management 4. Possible outpatient 30 day monitor as necessary  Signed, Serafina Royals M.D. FACC

## 2015-03-18 NOTE — Progress Notes (Signed)
Teresa Krueger is a 81 y.o. female patient. 1. Near syncope   2. Hypoglycemia   3. Syncope    Past Medical History  Diagnosis Date  . Mini stroke   . Asthma   . Diabetes mellitus   . Hypertension   . Breast cancer, left   . Lymphedema   . COPD (chronic obstructive pulmonary disease)   . Arthritis   . Bronchiectasis   . Hypercholesterolemia   . Barrett's esophagus   . Osteoporosis    Current Facility-Administered Medications  Medication Dose Route Frequency Provider Last Rate Last Dose  . acetaminophen (TYLENOL) tablet 650 mg  650 mg Oral Q6H PRN Lytle Butte, MD   650 mg at 03/17/15 1610   Or  . acetaminophen (TYLENOL) suppository 650 mg  650 mg Rectal Q6H PRN Lytle Butte, MD      . ALPRAZolam Duanne Moron) tablet 0.5 mg  0.5 mg Oral TID PRN Lytle Butte, MD   0.5 mg at 03/18/15 0855  . alum & mag hydroxide-simeth (MAALOX/MYLANTA) 200-200-20 MG/5ML suspension 30 mL  30 mL Oral Q4H PRN Lytle Butte, MD      . calcium-vitamin D (OSCAL WITH D) 500-200 MG-UNIT per tablet 1 tablet  1 tablet Oral Daily Lytle Butte, MD   1 tablet at 03/17/15 1000  . fluticasone (FLONASE) 50 MCG/ACT nasal spray 2 spray  2 spray Each Nare Daily Lytle Butte, MD   2 spray at 03/17/15 [phone]  . guaiFENesin-dextromethorphan (ROBITUSSIN DM) 100-10 MG/5ML syrup 5 mL  5 mL Oral Q4H PRN Lytle Butte, MD      . heparin injection 5,000 Units  5,000 Units Subcutaneous 3 times per day Lytle Butte, MD   5,000 Units at 03/18/15 0518  . hydrALAZINE (APRESOLINE) injection 10 mg  10 mg Intravenous Q4H PRN Lytle Butte, MD   10 mg at 03/16/15 2311  . insulin aspart (novoLOG) injection 0-24 Units  0-24 Units Subcutaneous 6 times per day Tama High III, MD      . losartan (COZAAR) tablet 25 mg  25 mg Oral Daily Lytle Butte, MD   25 mg at 03/17/15 1000  . mometasone-formoterol (DULERA) 100-5 MCG/ACT inhaler 2 puff  2 puff Inhalation BID Lytle Butte, MD   2 puff at 03/17/15 2101  . multivitamin with minerals tablet  1 tablet  1 tablet Oral Daily Lytle Butte, MD   1 tablet at 03/17/15 1000  . ondansetron (ZOFRAN) tablet 4 mg  4 mg Oral Q6H PRN Lytle Butte, MD       Or  . ondansetron [facility]) injection 4 mg  4 mg Intravenous Q6H PRN Lytle Butte, MD      . pantoprazole (PROTONIX) EC tablet 40 mg  40 mg Oral Daily Lytle Butte, MD   40 mg at 03/17/15 5409  . simvastatin (ZOCOR) tablet 20 mg  20 mg Oral Daily Lytle Butte, MD   20 mg at 03/17/15 1000  . sodium chloride 0.9 % injection 3 mL  3 mL Intravenous Q12H Lytle Butte, MD   3 mL at 03/17/15 2100  . sucralfate (CARAFATE) tablet 1 g  1 g Oral QID Lytle Butte, MD   1 g at 03/17/15 2100   Allergies  Allergen Reactions  . Aspirin Swelling and Other (See Comments)    Pt states that she has tongue swelling.    . Celebrex [Celecoxib] Anaphylaxis  . Meloxicam  Swelling  . Nsaids Anaphylaxis  . Rofecoxib Anaphylaxis  . Tolmetin Anaphylaxis  . Biguanide Copolymer Other (See Comments)    Reaction:  Unknown   . Buprenorphine Hcl Other (See Comments)    Reaction:  Unknown   . Ciprofloxacin Other (See Comments)    Reaction:  Unknown   . Codeine Nausea And Vomiting and Other (See Comments)    Reaction:  Syncope   . Delsym [Dextromethorphan Polistirex Er] Other (See Comments)    Reaction:  GI upset   . Dextromethorphan Hbr Other (See Comments)    Reaction:  GI upset   . Furosemide Other (See Comments)    Reaction:  Unknown   . Hydrocodone Other (See Comments)    Reaction:  Unknown   . Metformin Other (See Comments)    Reaction:  Unknown   . Morphine And Related Other (See Comments)    Reaction:  Unknown   . Penicillins Swelling and Other (See Comments)    Pt did not answer the additional questions for this medication because she does not remember.    . Pregabalin Other (See Comments)    Reaction:  Unknown   . Promethazine Other (See Comments)    Reaction:  Unknown   . Propoxyphene Other (See Comments)    Reaction:  Unknown   . Quinolones  Other (See Comments)    Reaction:  Unknown   . Seroquel [Quetiapine Fumarate] Other (See Comments)    Reaction:  Unknown   . Serotonin Reuptake Inhibitors (Ssris) Other (See Comments)    Reaction:  Unknown   . Sulfa Antibiotics Hives and Itching  . Tramadol Nausea Only  . Ultracet [Tramadol-Acetaminophen] Other (See Comments)    Reaction:  Unknown    Principal Problem:   Mobitz type 2 second degree AV block Active Problems:   Diabetes mellitus   Hypertension   Chronic bronchitis   GERD (gastroesophageal reflux disease)   Morbid obesity   Hypersomnia with sleep apnea  Blood pressure 163/50, pulse 75, temperature 98.1 F (36.7 C), temperature source Oral, resp. rate 20, height 5' (1.524 m), weight 134.945 kg (297 lb 8 oz), SpO2 98 %.  Subjective Objective Assessment & Plan 1. Near syncope/2nd degree AVB- cardiology has seen; await echo and myoview, will need pt if this is ok 2. Accelerated HTN- Given severe other disease and unlikely chf will not be overly aggressive with this 3. OSA- check overnight oximetry, given morning h/a, increased snoring (per pt/family). Apparently very sleep of late as well 4. DDD/DJD-  No change, severe knee oa also 5. GERD/IBS- cont home regimen 6. COPD-  Stable  7. Anxiety- cont xanax. Signed Kirk Ruths. 03/18/2015

## 2015-03-18 NOTE — Progress Notes (Signed)
Alert and oriented. 2nd degree heart block type 1 on tele. Second part of stress test completed today. Patient has been incontinent some today, but output has been good since foley removed. Patient sat up in the chair intermittently today. Will continue to monitor.

## 2015-03-19 LAB — GLUCOSE, CAPILLARY
GLUCOSE-CAPILLARY: 111 mg/dL — AB (ref 65–99)
GLUCOSE-CAPILLARY: 171 mg/dL — AB (ref 65–99)
GLUCOSE-CAPILLARY: 204 mg/dL — AB (ref 65–99)
Glucose-Capillary: 100 mg/dL — ABNORMAL HIGH (ref 65–99)
Glucose-Capillary: 106 mg/dL — ABNORMAL HIGH (ref 65–99)
Glucose-Capillary: 159 mg/dL — ABNORMAL HIGH (ref 65–99)

## 2015-03-19 LAB — NM MYOCAR MULTI W/SPECT W/WALL MOTION / EF
LVDIAVOL: 59 mL
LVSYSVOL: 28 mL
SDS: 1
SRS: 16
SSS: 24
TID: 1.03

## 2015-03-19 MED ORDER — TORSEMIDE 5 MG PO TABS
5.0000 mg | ORAL_TABLET | Freq: Every day | ORAL | Status: DC
  Administered 2015-03-19 – 2015-03-22 (×3): 5 mg via ORAL
  Filled 2015-03-19 (×3): qty 1

## 2015-03-19 MED ORDER — SPIRONOLACTONE 25 MG PO TABS
25.0000 mg | ORAL_TABLET | Freq: Every day | ORAL | Status: DC
  Administered 2015-03-19 – 2015-03-22 (×3): 25 mg via ORAL
  Filled 2015-03-19 (×3): qty 1

## 2015-03-19 NOTE — Progress Notes (Signed)
Patient has rested comfortably today and has gotten up to the chair. Physical therapy was able to walk her in the room some, which patient states her baseline is only going to and from the bathroom. She uses her electric wheelchair for longer distances. No complaints of pain. Xanax given once for anxiety. Vitals and blood sugars stable. Will continue to monitor.

## 2015-03-19 NOTE — Progress Notes (Signed)
Physical Therapy Evaluation Patient Details Name: Teresa Krueger MRN: [id number] DOB: 1934/01/19 Today's Date: 03/19/2015   History of Present Illness  Patient is a 81 y.o. female admitted for worsening dyspnea and feelings of syncope on 22 September.  Clinical Impression  Patient found sitting in chair, welcoming PT into room. Patient expressed that she began to experience symptomology over course of 3 days before coming to hospital. Patient describes her house to be handicap accessible, detailing available equipment. Patient admits that she hasn't walked much over past 10 years, limited to mobility between rooms. Admits that she would like to be able to do more. Patient demonstrates good safety awareness during transfers/gait on evaluation; however, her HR dropped from 85 bpm to 50 bpm. Returned to resting HR after 2 mins of rest. Patient described feeling SOB, but oxygen saturations remained in upper 90s. Patient will continue to benefit from skilled PT to improve strength, endurance, gait, and function.    Follow Up Recommendations Home health PT    Equipment Recommendations  None recommended by PT    Recommendations for Other Services       Precautions / Restrictions Precautions Precautions: Fall Restrictions Weight Bearing Restrictions: No      Mobility  Bed Mobility                  Transfers Overall transfer level: Needs assistance Equipment used: Rolling walker (2 wheeled) Transfers: Sit to/from Stand Sit to Stand: Min assist         General transfer comment: Patient demonstrates good safety awareness. Patient required minimal assistance into standing. No LOB/dizziness.  Ambulation/Gait Ambulation/Gait assistance: Min guard Ambulation Distance (Feet): 10 Feet Assistive device: Rolling walker (2 wheeled) Gait Pattern/deviations: Decreased stride length;Step-through pattern     General Gait Details: Patient ambulates at decreased cadence. Demonstrates  general fatigue. SOB following activity. HR dropped from 85 bpm at rest to 50 bpm. Returned to 85 bpm within 2 minutes of resting. Nurse notified.  Stairs            Wheelchair Mobility    Modified Rankin (Stroke Patients Only)       Balance Overall balance assessment: Modified Independent                                           Pertinent Vitals/Pain Pain Assessment: No/denies pain    Home Living Family/patient expects to be discharged to:: Private residence Living Arrangements: Spouse/significant other Available Help at Discharge: Family Type of Home: House Home Access: Level entry     Home Layout: One level;Other (Comment) (Ramps within home) Home Equipment: Walker - 4 wheels;Wheelchair - power;Grab bars - toilet;Grab bars - tub/shower;Shower seat - built in Additional Comments: Patient ambulates very minimally within home. Wheelchair used in community.    Prior Function Level of Independence: Needs assistance   Gait / Transfers Assistance Needed: Modified independent with rollator; relies on wheelchair  ADL's / Homemaking Assistance Needed: Performed by husband        Hand Dominance        Extremity/Trunk Assessment   Upper Extremity Assessment: Overall WFL for tasks assessed;Generalized weakness           Lower Extremity Assessment: RLE deficits/detail;Generalized weakness;LLE deficits/detail RLE Deficits / Details: Lacking terminal knee extension; cellulitis LLE Deficits / Details: Cellulitis  Cervical / Trunk Assessment: Other exceptions (Obese)  Communication   Communication:  No difficulties  Cognition Arousal/Alertness: Awake/alert Behavior During Therapy: WFL for tasks assessed/performed Overall Cognitive Status: Within Functional Limits for tasks assessed                      General Comments      Exercises        Assessment/Plan    PT Assessment Patient needs continued PT services  PT Diagnosis  Difficulty walking;Generalized weakness   PT Problem List Decreased strength;Decreased range of motion;Decreased activity tolerance;Decreased mobility;Obesity  PT Treatment Interventions Gait training;Functional mobility training;Therapeutic activities;Therapeutic exercise;Patient/family education   PT Goals (Current goals can be found in the Care Plan section) Acute Rehab PT Goals Patient Stated Goal: "To go home" PT Goal Formulation: With patient/family Time For Goal Achievement: 04/02/15 Potential to Achieve Goals: Good    Frequency Min 2X/week   Barriers to discharge        Co-evaluation               End of Session Equipment Utilized During Treatment: Gait belt Activity Tolerance: Patient tolerated treatment well;Patient limited by fatigue Patient left: in chair;with call bell/phone within reach;with family/visitor present Nurse Communication: Mobility status         Time: 1230-1310 PT Time Calculation (min) (ACUTE ONLY): 40 min   Charges:   PT Evaluation $Initial PT Evaluation Tier I: 1 Procedure     PT G Codes:        Dorice Lamas, PT, DPT 03/19/2015, 1:26 PM

## 2015-03-19 NOTE — Progress Notes (Signed)
Teresa Krueger is a 81 y.o. female patient. 1. Near syncope   2. Hypoglycemia   3. Syncope    Past Medical History  Diagnosis Date  . Mini stroke   . Asthma   . Diabetes mellitus   . Hypertension   . Breast cancer, left   . Lymphedema   . COPD (chronic obstructive pulmonary disease)   . Arthritis   . Bronchiectasis   . Hypercholesterolemia   . Barrett's esophagus   . Osteoporosis    Current Facility-Administered Medications  Medication Dose Route Frequency Provider Last Rate Last Dose  . acetaminophen (TYLENOL) tablet 650 mg  650 mg Oral Q6H PRN Lytle Butte, MD   650 mg at 03/17/15 5956   Or  . acetaminophen (TYLENOL) suppository 650 mg  650 mg Rectal Q6H PRN Lytle Butte, MD      . ALPRAZolam Duanne Moron) tablet 0.5 mg  0.5 mg Oral TID PRN Lytle Butte, MD   0.5 mg at 03/19/15 0513  . alum & mag hydroxide-simeth (MAALOX/MYLANTA) 200-200-20 MG/5ML suspension 30 mL  30 mL Oral Q4H PRN Lytle Butte, MD      . calcium-vitamin D (OSCAL WITH D) 500-200 MG-UNIT per tablet 1 tablet  1 tablet Oral Daily Lytle Butte, MD   1 tablet at 03/19/15 [phone]  . fluticasone (FLONASE) 50 MCG/ACT nasal spray 2 spray  2 spray Each Nare Daily Lytle Butte, MD   2 spray at 03/19/15 0940  . guaiFENesin-dextromethorphan (ROBITUSSIN DM) 100-10 MG/5ML syrup 5 mL  5 mL Oral Q4H PRN Lytle Butte, MD      . heparin injection 5,000 Units  5,000 Units Subcutaneous 3 times per day Lytle Butte, MD   5,000 Units at 03/19/15 0513  . hydrALAZINE (APRESOLINE) injection 10 mg  10 mg Intravenous Q4H PRN Lytle Butte, MD   10 mg at 03/16/15 2311  . insulin aspart (novoLOG) injection 0-24 Units  0-24 Units Subcutaneous 6 times per day Tama High III, MD   8 Units at 03/18/15 2054  . losartan (COZAAR) tablet 25 mg  25 mg Oral Daily Lytle Butte, MD   25 mg at 03/19/15 0940  . mometasone-formoterol (DULERA) 100-5 MCG/ACT inhaler 2 puff  2 puff Inhalation BID Lytle Butte, MD   2 puff at 03/19/15 0751  .  multivitamin with minerals tablet 1 tablet  1 tablet Oral Daily Lytle Butte, MD   1 tablet at 03/19/15 0940  . ondansetron (ZOFRAN) tablet 4 mg  4 mg Oral Q6H PRN Lytle Butte, MD       Or  . ondansetron [facility]) injection 4 mg  4 mg Intravenous Q6H PRN Lytle Butte, MD      . pantoprazole (PROTONIX) EC tablet 40 mg  40 mg Oral Daily Lytle Butte, MD   40 mg at 03/19/15 0940  . simvastatin (ZOCOR) tablet 20 mg  20 mg Oral Daily Lytle Butte, MD   20 mg at 03/18/15 1010  . sodium chloride 0.9 % injection 3 mL  3 mL Intravenous Q12H Lytle Butte, MD   3 mL at 03/19/15 0947  . sucralfate (CARAFATE) tablet 1 g  1 g Oral QID Lytle Butte, MD   1 g at 03/19/15 0751   Allergies  Allergen Reactions  . Aspirin Swelling and Other (See Comments)    Pt states that she has tongue swelling.    . Celebrex [Celecoxib] Anaphylaxis  .  Meloxicam Swelling  . Nsaids Anaphylaxis  . Rofecoxib Anaphylaxis  . Tolmetin Anaphylaxis  . Biguanide Copolymer Other (See Comments)    Reaction:  Unknown   . Buprenorphine Hcl Other (See Comments)    Reaction:  Unknown   . Ciprofloxacin Other (See Comments)    Reaction:  Unknown   . Codeine Nausea And Vomiting and Other (See Comments)    Reaction:  Syncope   . Delsym [Dextromethorphan Polistirex Er] Other (See Comments)    Reaction:  GI upset   . Dextromethorphan Hbr Other (See Comments)    Reaction:  GI upset   . Furosemide Other (See Comments)    Reaction:  Unknown   . Hydrocodone Other (See Comments)    Reaction:  Unknown   . Metformin Other (See Comments)    Reaction:  Unknown   . Morphine And Related Other (See Comments)    Reaction:  Unknown   . Penicillins Swelling and Other (See Comments)    Pt did not answer the additional questions for this medication because she does not remember.    . Pregabalin Other (See Comments)    Reaction:  Unknown   . Promethazine Other (See Comments)    Reaction:  Unknown   . Propoxyphene Other (See Comments)     Reaction:  Unknown   . Quinolones Other (See Comments)    Reaction:  Unknown   . Seroquel [Quetiapine Fumarate] Other (See Comments)    Reaction:  Unknown   . Serotonin Reuptake Inhibitors (Ssris) Other (See Comments)    Reaction:  Unknown   . Sulfa Antibiotics Hives and Itching  . Tramadol Nausea Only  . Ultracet [Tramadol-Acetaminophen] Other (See Comments)    Reaction:  Unknown    Principal Problem:   Mobitz type 2 second degree AV block Active Problems:   Diabetes mellitus   Hypertension   Chronic bronchitis   GERD (gastroesophageal reflux disease)   Morbid obesity   Hypersomnia with sleep apnea   Subjective  Still weak and dizzy, hasn't gotten up any. Reveiwed cardiology notes, myoview and echo were nl.  No nvd no fcs Objective  avss (flow sheet reviewed)  nad alert and oriented and talkative Chest; clear  Heart; rrr no mrg  Abd; nontender nl bs Ext; 2+ edema (erythema improved) Assessment & Plan  Allergies  Allergen Reactions  . Aspirin Swelling and Other (See Comments)    Pt states that she has tongue swelling.   . Celebrex [Celecoxib] Anaphylaxis  . Meloxicam Swelling  . Nsaids Anaphylaxis  . Rofecoxib Anaphylaxis  . Tolmetin Anaphylaxis  . Biguanide Copolymer Other (See Comments)    Reaction: Unknown   . Buprenorphine Hcl Other (See Comments)    Reaction: Unknown   . Ciprofloxacin Other (See Comments)    Reaction: Unknown   . Codeine Nausea And Vomiting and Other (See Comments)    Reaction: Syncope   . Delsym [Dextromethorphan Polistirex Er] Other (See Comments)    Reaction: GI upset   . Dextromethorphan Hbr Other (See Comments)    Reaction: GI upset   . Furosemide Other (See Comments)    Reaction: Unknown   . Hydrocodone Other (See Comments)    Reaction: Unknown   . Metformin Other (See Comments)    Reaction: Unknown   . Morphine And Related Other (See  Comments)    Reaction: Unknown   . Penicillins Swelling and Other (See Comments)    Pt did not answer the additional questions for this medication because she does  not remember.   . Pregabalin Other (See Comments)    Reaction: Unknown   . Promethazine Other (See Comments)    Reaction: Unknown   . Propoxyphene Other (See Comments)    Reaction: Unknown   . Quinolones Other (See Comments)    Reaction: Unknown   . Seroquel [Quetiapine Fumarate] Other (See Comments)    Reaction: Unknown   . Serotonin Reuptake Inhibitors (Ssris) Other (See Comments)    Reaction: Unknown   . Sulfa Antibiotics Hives and Itching  . Tramadol Nausea Only  . Ultracet [Tramadol-Acetaminophen] Other (See Comments)    Reaction: Unknown    Principal Problem:  Mobitz type 2 second degree AV block Active Problems:  Diabetes mellitus  Hypertension  Chronic bronchitis  GERD (gastroesophageal reflux disease)  Morbid obesity  Hypersomnia with sleep apnea    Assessment & Plan 1. Near syncope/2nd degree AVB- cardiology work up negative thus far, still dizzy and hasn't gotten up yet, will consult pt to help assess ability to ambulate well enough for safe dc, restart diuretics 2. Accelerated HTN- Given severe other disease and unlikely chf will not be overly aggressive with this 3. OSA- check overnight oximetry, given morning h/a, increased snoring (per pt/family).  4. DDD/DJD- No change, severe knee oa also 5. GERD/IBS- cont home regimen 6. COPD- Stable  7. Anxiety- cont xanax. Signed       Signed Kirk Ruths. 03/19/2015

## 2015-03-19 NOTE — Progress Notes (Signed)
[facility] Encounter Note  Patient: Teresa Krueger / Admit Date: 03/16/2015 / Date of Encounter: 03/19/2015, 7:57 AM   Subjective: Patient is very immobile chronically which could be related to her current condition of dizziness although these have improved as well Echocardiogram showing normal LV systolic function with no evidence of critical valvular heart disease with ejection fraction of 55% or greater Stress test shows normal myocardial perfusion without evidence of myocardial ischemia Review of Systems: Positive for: Dizziness Negative for: Vision change, hearing change, syncope,  nausea, vomiting,diarrhea, bloody stool, stomach pain, cough, congestion, diaphoresis, urinary frequency, urinary pain,skin lesions, skin rashes Others previously listed  Objective: Telemetry: Normal sinus rhythm Physical Exam: Blood pressure 153/42, pulse 83, temperature 98.2 F (36.8 C), temperature source Oral, resp. rate 20, height 5' (1.524 m), weight 297 lb 8 oz (134.945 kg), SpO2 98 %. Body mass index is 58.1 kg/(m^2). General: Well developed, well nourished, in no acute distress. Head: Normocephalic, atraumatic, sclera non-icteric, no xanthomas, nares are without discharge. Neck: No apparent masses Lungs: Normal respirations with no wheezes, no rhonchi, no rales , no crackles   Heart: Regular rate and rhythm, normal S1 S2, no murmur, no rub, no gallop, PMI is normal size and placement, carotid upstroke normal without bruit, jugular venous pressure normalnor Abdomen: Soft, non-tender, distended with normoactive bowel sounds. No hepatosplenomegaly. Abdominal aorta is not heard Extremities: Trace edema, no clubbing, no cyanosis, no ulcers,  Peripheral: 2+ radial, 2+ femoral, 2+ dorsal pedal pulses Neuro: Alert and oriented. Moves all extremities spontaneously. Psych:  Responds to questions appropriately with a normal affect.   Intake/Output Summary (Last 24 hours) at  03/19/15 0757 Last data filed at 03/19/15 0354  Gross per 24 hour  Intake   2517 ml  Output   1450 ml  Net   1067 ml    Inpatient Medications:  . calcium-vitamin D  1 tablet Oral Daily  . fluticasone  2 spray Each Nare Daily  . heparin  5,000 Units Subcutaneous 3 times per day  . insulin aspart  0-24 Units Subcutaneous 6 times per day  . losartan  25 mg Oral Daily  . mometasone-formoterol  2 puff Inhalation BID  . multivitamin with minerals  1 tablet Oral Daily  . pantoprazole  40 mg Oral Daily  . simvastatin  20 mg Oral Daily  . sodium chloride  3 mL Intravenous Q12H  . sucralfate  1 g Oral QID   Infusions:    Labs:  Recent Labs  03/17/15 0606 03/17/15 1153 03/18/15 0518  NA 142  --  141  K 5.0  --  4.1  CL 112*  --  112*  CO2 25  --  22  GLUCOSE 168*  --  144*  BUN 34*  --  26*  CREATININE 1.04*  --  0.98  CALCIUM 9.2  --  9.1  MG  --  2.4  --    No results for input(s): AST, ALT, ALKPHOS, BILITOT, PROT, ALBUMIN in the last 72 hours.  Recent Labs  03/16/15 1922 03/17/15 0606  WBC 8.3 5.3  NEUTROABS 5.0  --   HGB 13.5 12.7  HCT 41.2 38.6  MCV 89.3 88.5  PLT 209 154    Recent Labs  03/16/15 1922 03/17/15 0606 03/17/15 1153 03/17/15 1439  TROPONINI <0.03 0.03 0.04* 0.03   Invalid input(s): POCBNP No results for input(s): HGBA1C in the last 72 hours.   Weights: Filed Weights   03/17/15 0056  Weight: 297  lb 8 oz (134.945 kg)     Radiology/Studies:  Dg Chest Portable 1 View  03/16/2015   CLINICAL DATA:  Intermittent shortness of breath. Weakness. Near syncopal episodes for the past few days.  EXAM: PORTABLE CHEST 1 VIEW  COMPARISON:  03/26/2012.  Chest CTA dated 07/26/2014.  FINDINGS: Borderline enlarged cardiac silhouette. Mildly prominent pulmonary vasculature and and interstitial markings. Thoracic spine degenerative changes. No pleural fluid.  IMPRESSION: 1. No acute abnormality. 2. Borderline cardiomegaly with stable mild chronic pulmonary  vascular congestion and minimal chronic interstitial lung disease.   Electronically Signed   By: Claudie Revering M.D.   On: 03/16/2015 19:35     Assessment and Recommendation  81 y.o. female with known aortic valve stenosis mild in nature with hypertension essential in nature with hyperlipidemia having dizzy spells and presyncope of unknown etiology without evidence of significant heart failure or LV systolic dysfunction with an echocardiogram showing normal LV function ejection fraction of 60% and no evidence of telemetry changes with a stress test which shows normal myocardial perfusion and or no primary source of cause of problems from the cardiac standpoint. 1. Begin ambulation as able and follow for new symptoms or issues 2. No further intervention of normal stress test and normal LV systolic function 3. No change in medication management 4. Possible outpatient 30 day monitor as necessary 5. Okay for discharge home from cardiac standpoint  Signed, Serafina Royals M.D. FACC

## 2015-03-20 ENCOUNTER — Inpatient Hospital Stay: Payer: Medicare Other

## 2015-03-20 LAB — COMPREHENSIVE METABOLIC PANEL
ALBUMIN: 3.4 g/dL — AB (ref 3.5–5.0)
ALK PHOS: 82 U/L (ref 38–126)
ALT: 20 U/L (ref 14–54)
AST: 28 U/L (ref 15–41)
Anion gap: 11 (ref 5–15)
BILIRUBIN TOTAL: 1.1 mg/dL (ref 0.3–1.2)
BUN: 30 mg/dL — AB (ref 6–20)
CALCIUM: 8.9 mg/dL (ref 8.9–10.3)
CO2: 22 mmol/L (ref 22–32)
CREATININE: 1.19 mg/dL — AB (ref 0.44–1.00)
Chloride: 104 mmol/L (ref 101–111)
GFR calc Af Amer: 49 mL/min — ABNORMAL LOW (ref >60)
GFR calc non Af Amer: 42 mL/min — ABNORMAL LOW (ref >60)
GLUCOSE: 223 mg/dL — AB (ref 65–99)
Potassium: 3.8 mmol/L (ref 3.5–5.1)
Sodium: 137 mmol/L (ref 135–145)
TOTAL PROTEIN: 6.3 g/dL — AB (ref 6.5–8.1)

## 2015-03-20 LAB — ABO/RH: ABO/RH(D): B POS

## 2015-03-20 LAB — TYPE AND SCREEN
ABO/RH(D): B POS
Antibody Screen: NEGATIVE

## 2015-03-20 LAB — APTT: aPTT: 32 seconds (ref 24–36)

## 2015-03-20 LAB — GLUCOSE, CAPILLARY
GLUCOSE-CAPILLARY: 107 mg/dL — AB (ref 65–99)
GLUCOSE-CAPILLARY: 117 mg/dL — AB (ref 65–99)
GLUCOSE-CAPILLARY: 136 mg/dL — AB (ref 65–99)
GLUCOSE-CAPILLARY: 253 mg/dL — AB (ref 65–99)
Glucose-Capillary: 165 mg/dL — ABNORMAL HIGH (ref 65–99)

## 2015-03-20 LAB — CBC
HCT: 35.4 % (ref 35.0–47.0)
HEMOGLOBIN: 12 g/dL (ref 12.0–16.0)
MCH: 29.9 pg (ref 26.0–34.0)
MCHC: 33.9 g/dL (ref 32.0–36.0)
MCV: 88.1 fL (ref 80.0–100.0)
Platelets: 132 10*3/uL — ABNORMAL LOW (ref 150–440)
RBC: 4.02 MIL/uL (ref 3.80–5.20)
RDW: 13.5 % (ref 11.5–14.5)
WBC: 4.5 10*3/uL (ref 3.6–11.0)

## 2015-03-20 LAB — BLOOD GAS, ARTERIAL
ACID-BASE EXCESS: 1 mmol/L (ref 0.0–3.0)
Allens test (pass/fail): POSITIVE — AB
BICARBONATE: 25.2 meq/L (ref 21.0–28.0)
FIO2: 21
O2 Saturation: 92.4 %
PATIENT TEMPERATURE: 37
PO2 ART: 63 mmHg — AB (ref 83.0–108.0)
pCO2 arterial: 38 mmHg (ref 32.0–48.0)
pH, Arterial: 7.43 (ref 7.350–7.450)

## 2015-03-20 LAB — PROTIME-INR
INR: 1.16
Prothrombin Time: 15 seconds (ref 11.4–15.0)

## 2015-03-20 MED ORDER — SODIUM CHLORIDE 0.9 % IJ SOLN: 3.0000 mL | INTRAMUSCULAR | Status: DC | PRN

## 2015-03-20 MED ORDER — INFLUENZA VAC SPLIT QUAD 0.5 ML IM SUSY
0.5000 mL | PREFILLED_SYRINGE | INTRAMUSCULAR | Status: AC
  Administered 2015-03-22: 0.5 mL via INTRAMUSCULAR
  Filled 2015-03-20: qty 0.5

## 2015-03-20 NOTE — Progress Notes (Signed)
[facility] Encounter Note  Patient: Teresa Krueger / Admit Date: 03/16/2015 / Date of Encounter: 03/20/2015, 12:46 PM   Subjective: Patient had some significant symptoms of dizziness and presyncope yesterday with ambulation and had a correlating rhythm of second-degree type II AV block and chronotropic incompetence  Echocardiogram showing normal LV systolic function with no evidence of critical valvular heart disease with ejection fraction of 55% or greater Stress test shows normal myocardial perfusion without evidence of myocardial ischemia Review of Systems: Positive for: Dizziness and presyncope Negative for: Vision change, hearing change, syncope,  nausea, vomiting,diarrhea, bloody stool, stomach pain, cough, congestion, diaphoresis, urinary frequency, urinary pain,skin lesions, skin rashes Others previously listed  Objective: Telemetry: Normal sinus rhythm Physical Exam: Blood pressure 138/40, pulse 70, temperature 98.2 F (36.8 C), temperature source Oral, resp. rate 18, height 5' (1.524 m), weight 297 lb 8 oz (134.945 kg), SpO2 100 %. Body mass index is 58.1 kg/(m^2). General: Well developed, well nourished, in no acute distress. Head: Normocephalic, atraumatic, sclera non-icteric, no xanthomas, nares are without discharge. Neck: No apparent masses Lungs: Normal respirations with no wheezes, no rhonchi, no rales , no crackles   Heart: Regular rate and rhythm, normal S1 soft S2, 2-3 out of 6 right upper sternal border murmur radiating throughout, no rub, no gallop, PMI is normal size and placement, carotid upstroke normal with  bruit, jugular venous pressure normalnor Abdomen: Soft, non-tender, distended with normoactive bowel sounds. No hepatosplenomegaly. Abdominal aorta is not heard Extremities: Trace edema, no clubbing, no cyanosis, no ulcers,  Peripheral: 2+ radial, 0+ femoral,0+ dorsal pedal pulses Neuro: Alert and oriented. Moves all extremities  spontaneously. Psych:  Responds to questions appropriately with a normal affect.   Intake/Output Summary (Last 24 hours) at 03/20/15 1246 Last data filed at 03/20/15 1234  Gross per 24 hour  Intake    720 ml  Output   2000 ml  Net  -1280 ml    Inpatient Medications:  . calcium-vitamin D  1 tablet Oral Daily  . fluticasone  2 spray Each Nare Daily  . heparin  5,000 Units Subcutaneous 3 times per day  . insulin aspart  0-24 Units Subcutaneous 6 times per day  . losartan  25 mg Oral Daily  . mometasone-formoterol  2 puff Inhalation BID  . multivitamin with minerals  1 tablet Oral Daily  . pantoprazole  40 mg Oral Daily  . simvastatin  20 mg Oral Daily  . sodium chloride  3 mL Intravenous Q12H  . spironolactone  25 mg Oral Daily  . sucralfate  1 g Oral QID  . torsemide  5 mg Oral Daily   Infusions:    Labs:  Recent Labs  03/18/15 0518  NA 141  K 4.1  CL 112*  CO2 22  GLUCOSE 144*  BUN 26*  CREATININE 0.98  CALCIUM 9.1   No results for input(s): AST, ALT, ALKPHOS, BILITOT, PROT, ALBUMIN in the last 72 hours. No results for input(s): WBC, NEUTROABS, HGB, HCT, MCV, PLT in the last 72 hours.  Recent Labs  03/17/15 1439  TROPONINI 0.03   Invalid input(s): POCBNP No results for input(s): HGBA1C in the last 72 hours.   Weights: Filed Weights   03/17/15 0056  Weight: 297 lb 8 oz (134.945 kg)     Radiology/Studies:  Nm Myocar Multi W/spect W/wall Motion / Ef  03/19/2015    The study is normal.  This is a low risk study.  The left ventricular ejection fraction  is normal (55-65%).    Dg Chest Portable 1 View  03/16/2015   CLINICAL DATA:  Intermittent shortness of breath. Weakness. Near syncopal episodes for the past few days.  EXAM: PORTABLE CHEST 1 VIEW  COMPARISON:  03/26/2012.  Chest CTA dated 07/26/2014.  FINDINGS: Borderline enlarged cardiac silhouette. Mildly prominent pulmonary vasculature and and interstitial markings. Thoracic spine degenerative changes.  No pleural fluid.  IMPRESSION: 1. No acute abnormality. 2. Borderline cardiomegaly with stable mild chronic pulmonary vascular congestion and minimal chronic interstitial lung disease.   Electronically Signed   By: Claudie Revering M.D.   On: 03/16/2015 19:35     Assessment and Recommendation  81 y.o. female with known aortic valve stenosis mild in nature with hypertension essential in nature with hyperlipidemia having dizzy spells and presyncope of unknown etiology without evidence of significant heart failure or LV systolic dysfunction with an echocardiogram showing normal LV function ejection fraction of 60% and no evidence of telemetry changes with a stress test which shows normal myocardial perfusion and or no primary source of cause of problems from the cardiac standpoint. 1. Review echocardiogram for extent of aortic valve stenosis 2. Proceed to pacemaker placement of dual-chamber pacemaker placement on the right side due to lymphedema and previous breast cancer surgery on the left arm and left side for tropic incompetence and 2 second-degree heart block with symptoms of presyncope and dizziness. Patient understands risk and benefits of pacemaker placement. This includes a possibility does stroke heart attack infection bleeding blood clot hemopericardium and pneumothorax. The patient is at low risk for moderate sedation  Signed, Serafina Royals M.D. FACC

## 2015-03-20 NOTE — Care Management (Addendum)
Cardiology is recommending permanent pacemaker insertion.  Will anticipate home health nursing and physical therapy upon discharge home.  No agency preference

## 2015-03-20 NOTE — Care Management Important Message (Signed)
Important Message  Patient Details  Name: Teresa Krueger MRN: [id number] Date of Birth: December 13, 1933   Medicare Important Message Given:  Yes-second notification given    Juliann Pulse A Allmond 03/20/2015, 9:30 AM

## 2015-03-20 NOTE — Progress Notes (Signed)
Patient will have pacemaker placed tomorrow morning. Consent is signed and patient has been educated with documents. Labs have been drawn and chest xray repeated. Patient is aware she will be NPO after midnight. Questions have been answered. No further needs.

## 2015-03-20 NOTE — Progress Notes (Signed)
Patient ID: Teresa Krueger, female   DOB: Apr 01, 1934, 81 y.o.   MRN: [id number] SUBJECTIVE:  Admitted with c/o generalized weakness and increased fatigue over last week or so.  Echo and stress test unremarkable.   Tele shows episodes of 2nd degree AVB with HR into 40's and 50's at times; symptoms don't clearly correspond to this. ______________________________________________________________________  ROS: Please see HPI; remainder of complete 10 point ROS is negative   Past Medical History  Diagnosis Date  . Mini stroke   . Asthma   . Diabetes mellitus   . Hypertension   . Breast cancer, left   . Lymphedema   . COPD (chronic obstructive pulmonary disease)   . Arthritis   . Bronchiectasis   . Hypercholesterolemia   . Barrett's esophagus   . Osteoporosis     Past Surgical History  Procedure Laterality Date  . Breast lumpectomy  1999    left  . Mastectomy  1999    left  . Total hip arthroplasty  2001    left  . Gallbladder surgery    . Cataract extraction      bilateral  . Carpal tunnel release      right  . Trigger finger release      bilateral     Current facility-administered medications:  .  acetaminophen (TYLENOL) tablet 650 mg, 650 mg, Oral, Q6H PRN, 650 mg at 03/20/15 0751 **OR** acetaminophen (TYLENOL) suppository 650 mg, 650 mg, Rectal, Q6H PRN, Lytle Butte, MD .  ALPRAZolam Duanne Moron) tablet 0.5 mg, 0.5 mg, Oral, TID PRN, Lytle Butte, MD, 0.5 mg at 03/19/15 2136 .  alum & mag hydroxide-simeth (MAALOX/MYLANTA) 200-200-20 MG/5ML suspension 30 mL, 30 mL, Oral, Q4H PRN, Lytle Butte, MD .  calcium-vitamin D (OSCAL WITH D) 500-200 MG-UNIT per tablet 1 tablet, 1 tablet, Oral, Daily, Lytle Butte, MD, 1 tablet at 03/19/15 [phone]  fluticasone (FLONASE) 50 MCG/ACT nasal spray 2 spray, 2 spray, Each Nare, Daily, Lytle Butte, MD, 2 spray at 03/19/15 0940 .  guaiFENesin-dextromethorphan (ROBITUSSIN DM) 100-10 MG/5ML syrup 5 mL, 5 mL, Oral, Q4H PRN, Lytle Butte,  MD .  heparin injection 5,000 Units, 5,000 Units, Subcutaneous, 3 times per day, Lytle Butte, MD, 5,000 Units at 03/20/15 0432 .  hydrALAZINE (APRESOLINE) injection 10 mg, 10 mg, Intravenous, Q4H PRN, Lytle Butte, MD, 10 mg at 03/16/15 2311 .  insulin aspart (novoLOG) injection 0-24 Units, 0-24 Units, Subcutaneous, 6 times per day, Adin Hector, MD, 8 Units at 03/19/15 2127 .  losartan (COZAAR) tablet 25 mg, 25 mg, Oral, Daily, Lytle Butte, MD, 25 mg at 03/19/15 0940 .  mometasone-formoterol (DULERA) 100-5 MCG/ACT inhaler 2 puff, 2 puff, Inhalation, BID, Lytle Butte, MD, 2 puff at 03/19/15 2126 .  multivitamin with minerals tablet 1 tablet, 1 tablet, Oral, Daily, Lytle Butte, MD, 1 tablet at 03/19/15 0940 .  ondansetron (ZOFRAN) tablet 4 mg, 4 mg, Oral, Q6H PRN **OR** ondansetron (ZOFRAN) injection 4 mg, 4 mg, Intravenous, Q6H PRN, Lytle Butte, MD .  pantoprazole (PROTONIX) EC tablet 40 mg, 40 mg, Oral, Daily, Lytle Butte, MD, 40 mg at 03/19/15 0940 .  simvastatin (ZOCOR) tablet 20 mg, 20 mg, Oral, Daily, Lytle Butte, MD, 20 mg at 03/19/15 2127 .  sodium chloride 0.9 % injection 3 mL, 3 mL, Intravenous, Q12H, Lytle Butte, MD, 3 mL at 03/19/15 0947 .  spironolactone (ALDACTONE) tablet 25 mg, 25 mg, Oral,  Daily, Kirk Ruths, MD, 25 mg at 03/19/15 1124 .  sucralfate (CARAFATE) tablet 1 g, 1 g, Oral, QID, Lytle Butte, MD, 1 g at 03/20/15 0751 .  torsemide (DEMADEX) tablet 5 mg, 5 mg, Oral, Daily, Kirk Ruths, MD, 5 mg at 03/19/15 1124  PHYSICAL EXAM:  BP 147/42 mmHg  Pulse 87  Temp(Src) 98.7 F (37.1 C) (Oral)  Resp 20  Ht 5' (1.524 m)  Wt 134.945 kg (297 lb 8 oz)  BMI 58.10 kg/m2  SpO2 100%  General: morbidly obese  female, in NAD HEENT: PERRL; OP moist without lesions. Neck: supple, trachea midline, no thyromegaly Chest: normal to palpation Lungs: clear bilaterally without retractions or wheezes Cardiovascular: RRR with occ ectopy, distant heart  sounds with 2/6 murmur; distal pulses 2+ Abdomen: soft, nontender, nondistended, positive bowel sounds Extremities: no clubbing, cyanosis. 1+ edema.  Redness in right elbow from IV Neuro: alert and oriented, moves all extremities. Pain limits ROM in both arms Derm: no significant rashes or nodules; good skin turgor Lymph: no cervical or supraclavicular lymphadenopathy  Labs and imaging studies were reviewed  ASSESSMENT/PLAN:   1. Near syncope/2nd degree AVB- await any further thoughts from cardiology 2. Accelerated HTN-following on current meds 3. OSA- overnight oximetry with desats; would benefit from outpt sleep study 4. DDD/DJD- kpad, elevation to right arm for superficial thlebitis 5. GERD/IBS- cont home regimen 6. COPD- cont home meds 7. Anxiety- cont xanax.

## 2015-03-20 NOTE — Progress Notes (Signed)
Physical Therapy Treatment Patient Details Name: Teresa Krueger MRN: [id number] DOB: 08-06-1933 Today's Date: 03/20/2015    History of Present Illness Patient is a 81 y.o. female admitted for worsening dyspnea and feelings of syncope on 22 September.    PT Comments    Pt making progress towards goals with further ambulation this afternoon. Pt did become dyspnic with ambulation on room air (reference ambulation section for vitals), but her respirations recover quickly. Pt is pleasant and motivated to participate in therapy. She shows fair functional strength with bed mobility and transfers, but could stand to increase her strength and activity tolerance for safe return to home environment. Because of this, she will benefit from skilled PT.   Follow Up Recommendations  Home health PT     Equipment Recommendations  None recommended by PT    Recommendations for Other Services       Precautions / Restrictions Precautions Precautions: Fall Restrictions Weight Bearing Restrictions: No    Mobility  Bed Mobility Overal bed mobility: Needs Assistance Bed Mobility: Supine to Sit;Sit to Supine     Supine to sit: Min assist Sit to supine: Min assist   General bed mobility comments: Pt able to perform bed mobility with increased time and minor assist for trunk support  Transfers Overall transfer level: Needs assistance Equipment used: Rolling walker (2 wheeled) Transfers: Sit to/from Stand Sit to Stand: Min assist         General transfer comment: Pt with fair functional strength, only requiring minor assist to get fully into standing. No unsteadiness or LOB  Ambulation/Gait Ambulation/Gait assistance: Min guard Ambulation Distance (Feet): 20 Feet Assistive device:  (BRW) Gait Pattern/deviations: Step-through pattern;Decreased step length - right;Decreased step length - left Gait velocity: decreased Gait velocity interpretation: <1.8 ft/sec, indicative of risk for  recurrent falls General Gait Details: Pt ambulates generally WFL with BRW, but requires increased to do so. Pt becomes dyspnic after 15 ft of ambulation. Pt at rest on room air O2 sat was 95% and her HR was 45 BPM. Following ambulation her O2 sat was 93% and HR was 51 BPM.    Stairs            Wheelchair Mobility    Modified Rankin (Stroke Patients Only)       Balance                                    Cognition Arousal/Alertness: Awake/alert Behavior During Therapy: WFL for tasks assessed/performed Overall Cognitive Status: Within Functional Limits for tasks assessed                      Exercises      General Comments        Pertinent Vitals/Pain Pain Assessment: No/denies pain    Home Living                      Prior Function            PT Goals (current goals can now be found in the care plan section) Acute Rehab PT Goals Patient Stated Goal: to walk with therapy PT Goal Formulation: With patient/family Time For Goal Achievement: 04/02/15 Potential to Achieve Goals: Good Progress towards PT goals: Progressing toward goals    Frequency  Min 2X/week    PT Plan Current plan remains appropriate    Co-evaluation  End of Session Equipment Utilized During Treatment: Gait belt Activity Tolerance: Patient tolerated treatment well;Patient limited by fatigue Patient left: in bed;with call bell/phone within reach;with bed alarm set     Time: 5830-7460 PT Time Calculation (min) (ACUTE ONLY): 14 min  Charges:                       G CodesJanyth Contes 03-25-2015, 3:15 PM  Janyth Contes, SPT. [phone]

## 2015-03-21 ENCOUNTER — Encounter: Payer: Self-pay | Admitting: Anesthesiology

## 2015-03-21 ENCOUNTER — Inpatient Hospital Stay: Payer: Medicare Other | Admitting: Certified Registered Nurse Anesthetist

## 2015-03-21 ENCOUNTER — Inpatient Hospital Stay: Payer: Medicare Other

## 2015-03-21 ENCOUNTER — Encounter
Admission: EM | Disposition: A | Discharge status: Skilled Nursing Facility | Payer: Self-pay | Source: Home / Self Care | Attending: Internal Medicine

## 2015-03-21 HISTORY — PX: PACEMAKER INSERTION: SHX728

## 2015-03-21 LAB — GLUCOSE, CAPILLARY
GLUCOSE-CAPILLARY: 203 mg/dL — AB (ref 65–99)
GLUCOSE-CAPILLARY: 215 mg/dL — AB (ref 65–99)
Glucose-Capillary: 160 mg/dL — ABNORMAL HIGH (ref 65–99)
Glucose-Capillary: 171 mg/dL — ABNORMAL HIGH (ref 65–99)
Glucose-Capillary: 158 mg/dL — ABNORMAL HIGH (ref 65–99)
Glucose-Capillary: 154 mg/dL — ABNORMAL HIGH (ref 65–99)

## 2015-03-21 SURGERY — INSERTION, CARDIAC PACEMAKER
Anesthesia: Monitor Anesthesia Care | Site: Chest | Laterality: Right | Wound class: Clean

## 2015-03-21 MED ORDER — GENTAMICIN SULFATE 40 MG/ML IJ SOLN
INTRAMUSCULAR | Status: AC
  Filled 2015-03-21: qty 2

## 2015-03-21 MED ORDER — SODIUM CHLORIDE 0.9 % IJ SOLN
INTRAMUSCULAR | Status: AC
  Filled 2015-03-21: qty 50

## 2015-03-21 MED ORDER — ACETAMINOPHEN 325 MG PO TABS
325.0000 mg | ORAL_TABLET | ORAL | Status: DC | PRN
  Administered 2015-03-21: 650 mg via ORAL
  Filled 2015-03-21: qty 2

## 2015-03-21 MED ORDER — FENTANYL CITRATE (PF) 100 MCG/2ML IJ SOLN: 25.0000 ug | INTRAMUSCULAR | Status: DC | PRN

## 2015-03-21 MED ORDER — IOPAMIDOL (ISOVUE-300) INJECTION 61%
INTRAVENOUS | Status: DC | PRN
  Administered 2015-03-21: 20 mL via INTRAVENOUS

## 2015-03-21 MED ORDER — KETAMINE HCL 10 MG/ML IJ SOLN
INTRAMUSCULAR | Status: DC | PRN
  Administered 2015-03-21 (×3): 10 mg via INTRAVENOUS
  Administered 2015-03-21: 20 mg via INTRAVENOUS

## 2015-03-21 MED ORDER — ONDANSETRON HCL 4 MG/2ML IJ SOLN: 4.0000 mg | Freq: Four times a day (QID) | INTRAMUSCULAR | Status: DC | PRN

## 2015-03-21 MED ORDER — MIDAZOLAM HCL 2 MG/2ML IJ SOLN
INTRAMUSCULAR | Status: DC | PRN
Start: 2015-03-21 — End: 2015-03-21
  Administered 2015-03-21 (×2): 1 mg via INTRAVENOUS

## 2015-03-21 MED ORDER — FENTANYL CITRATE (PF) 100 MCG/2ML IJ SOLN
INTRAMUSCULAR | Status: DC | PRN
  Administered 2015-03-21 (×3): 25 ug via INTRAVENOUS

## 2015-03-21 MED ORDER — PROPOFOL 500 MG/50ML IV EMUL
INTRAVENOUS | Status: DC | PRN
  Administered 2015-03-21: 25 ug/kg/min via INTRAVENOUS

## 2015-03-21 MED ORDER — LIDOCAINE HCL (CARDIAC) 20 MG/ML IV SOLN
INTRAVENOUS | Status: DC | PRN
  Administered 2015-03-21: 60 mg via INTRAVENOUS

## 2015-03-21 MED ORDER — CLARITHROMYCIN 250 MG PO TABS
250.0000 mg | ORAL_TABLET | Freq: Two times a day (BID) | ORAL | Status: DC
  Administered 2015-03-21 – 2015-03-22 (×2): 250 mg via ORAL
  Filled 2015-03-21 (×2): qty 1

## 2015-03-21 MED ORDER — SODIUM CHLORIDE 0.9 % IR SOLN
Status: DC | PRN
  Administered 2015-03-21: 250 mL

## 2015-03-21 MED ORDER — LIDOCAINE 1 % OPTIME INJ - NO CHARGE
INTRAMUSCULAR | Status: DC | PRN
  Administered 2015-03-21: 27 mL

## 2015-03-21 MED ORDER — LACTATED RINGERS IV SOLN
INTRAVENOUS | Status: DC | PRN
  Administered 2015-03-21: 12:00:00 via INTRAVENOUS

## 2015-03-21 MED ORDER — SODIUM CHLORIDE 0.9 % IJ SOLN
INTRAMUSCULAR | Status: DC | PRN
  Administered 2015-03-21: 10 mL via INTRAVENOUS

## 2015-03-21 MED ORDER — INSULIN ASPART 100 UNIT/ML ~~LOC~~ SOLN
0.0000 [IU] | Freq: Three times a day (TID) | SUBCUTANEOUS | Status: DC
Start: 2015-03-21 — End: 2015-03-22
  Administered 2015-03-22: 8 [IU] via SUBCUTANEOUS
  Administered 2015-03-22 (×2): 2 [IU] via SUBCUTANEOUS
  Filled 2015-03-21: qty 8
  Filled 2015-03-21: qty 2
  Filled 2015-03-21: qty 8
  Filled 2015-03-21: qty 2

## 2015-03-21 SURGICAL SUPPLY — 36 items
BAG DECANTER FOR FLEXI CONT (MISCELLANEOUS) ×3 IMPLANT
BRUSH SCRUB 4% CHG (MISCELLANEOUS) ×3 IMPLANT
CABLE SURG 12 DISP A/V CHANNEL (MISCELLANEOUS) ×6 IMPLANT
CANISTER SUCT 1200ML W/VALVE (MISCELLANEOUS) ×3 IMPLANT
CHLORAPREP W/TINT 26ML (MISCELLANEOUS) ×3 IMPLANT
COVER LIGHT HANDLE STERIS (MISCELLANEOUS) ×6 IMPLANT
COVER MAYO STAND STRL (DRAPES) ×3 IMPLANT
DRAPE C-ARM XRAY 36X54 (DRAPES) ×12 IMPLANT
DRESSING TELFA 4X3 1S ST N-ADH (GAUZE/BANDAGES/DRESSINGS) ×3 IMPLANT
DRSG TEGADERM 4X4.75 (GAUZE/BANDAGES/DRESSINGS) ×3 IMPLANT
GLOVE BIO SURGEON STRL SZ7.5 (GLOVE) ×3 IMPLANT
GLOVE BIO SURGEON STRL SZ8 (GLOVE) ×3 IMPLANT
GOWN STRL REUS W/ TWL LRG LVL3 (GOWN DISPOSABLE) ×1 IMPLANT
GOWN STRL REUS W/ TWL XL LVL3 (GOWN DISPOSABLE) ×1 IMPLANT
GOWN STRL REUS W/TWL LRG LVL3 (GOWN DISPOSABLE) ×2
GOWN STRL REUS W/TWL XL LVL3 (GOWN DISPOSABLE) ×2
IMMOBILIZER SHDR MD LX WHT (SOFTGOODS) IMPLANT
IMMOBILIZER SHDR XL LX WHT (SOFTGOODS) IMPLANT
INTRO PACEMAKR LEAD 9FR 13CM (INTRODUCER) ×3
INTRO PACEMKR SHEATH II 7FR (MISCELLANEOUS) ×6
INTRODUCER PACEMKR LD 9FR 13CM (INTRODUCER) ×1 IMPLANT
INTRODUCER PACEMKR SHTH II 7FR (MISCELLANEOUS) ×2 IMPLANT
INTRODUCER SHEATH 8FR 12CM (MISCELLANEOUS) ×3 IMPLANT
IV NS 500ML (IV SOLUTION) ×2
IV NS 500ML BAXH (IV SOLUTION) ×1 IMPLANT
KIT RM TURNOVER STRD PROC AR (KITS) ×3 IMPLANT
LABEL OR SOLS (LABEL) ×3 IMPLANT
LEAD CAPSURE NOVUS 5076-52CM (Lead) ×3 IMPLANT
LEAD CAPSURE NOVUS 5076-58CM (Lead) ×3 IMPLANT
MARKER SKIN W/RULER 31145785 (MISCELLANEOUS) ×3 IMPLANT
MICROPUNCTURE 5FR NT-SST (MISCELLANEOUS) IMPLANT
PACK PACE INSERTION (MISCELLANEOUS) ×3 IMPLANT
PAD GROUND ADULT SPLIT (MISCELLANEOUS) ×3 IMPLANT
PAD STATPAD (MISCELLANEOUS) ×3 IMPLANT
PPM ADVISA MRI DR A2DR01 (Pacemaker) ×3 IMPLANT
SUT SILK 0 SH 30 (SUTURE) ×9 IMPLANT

## 2015-03-21 NOTE — Op Note (Signed)
[facility]   03/16/2015 - 03/21/2015                     2:28 PM  PATIENT:  [name]    PRE-OPERATIVE DIAGNOSIS:  Second-degree heart block  POST-OPERATIVE DIAGNOSIS:  Same  PROCEDURE:  INSERTION PACEMAKER  SURGEON:  Zeev Deakins, MD    ANESTHESIA:     PREOPERATIVE INDICATIONS:  KEIYANA STEHR is a  81 y.o. female with a diagnosis of Second-degree heart block who failed conservative measures and elected for surgical management.    The risks benefits and alternatives were discussed with the patient preoperatively including but not limited to the risks of infection, bleeding, cardiopulmonary complications, the need for revision surgery, among others, and the patient was willing to proceed.   OPERATIVE PROCEDURE: The patient was brought to the operating room the fasting state. The right pectoral region was prepped and draped in the usual sterile manner. Anesthesia was obtained 1% lidocaine locally. A 6 cm incision was performed over the right pectoral region. The pacemaker pocket was generated by electrocautery and blunt dissection. Access was obtained to the right subclavian vein by fine needle aspiration. MRI compatible leads were positioned to right ventricular apical septum and right atrial appendage under fluoroscopic guidance. After proper thresholds were obtained, the leads were sutured in place. Lead the pacemaker pocket was irrigated with gentamicin solution. The leads were connected to a dual-chamber rate responsive MRI compatible pacemaker ( Medtronic A2 DRO 1 ). The pocket was closed with 2-0 and 4-0 Vicryl, respectively. Steri-Strips and a pressure dressing were applied.

## 2015-03-21 NOTE — Anesthesia Preprocedure Evaluation (Signed)
Anesthesia Evaluation  Patient identified by MRN, date of birth, ID band Patient awake    Reviewed: Allergy & Precautions, H&P , NPO status , Patient's Chart, lab work & pertinent test results, reviewed documented beta blocker date and time   History of Anesthesia Complications Negative for: history of anesthetic complications  Airway Mallampati: IV  TM Distance: >3 FB Neck ROM: full    Dental no notable dental hx. (+) Caps, Poor Dentition   Pulmonary shortness of breath and with exertion, asthma , sleep apnea , COPD,  COPD inhaler, Recent URI , former smoker,    Pulmonary exam normal breath sounds clear to auscultation       Cardiovascular Exercise Tolerance: Good hypertension, On Medications (-) angina(-) CAD, (-) Past MI, (-) Cardiac Stents and (-) CABG Normal cardiovascular exam+ dysrhythmias (2nd degree AV block) + Valvular Problems/Murmurs AS  Rhythm:regular Rate:Normal     Neuro/Psych neg Seizures TIAnegative psych ROS   GI/Hepatic Neg liver ROS, GERD  Medicated and Controlled,  Endo/Other  negative endocrine ROSdiabetes, Well Controlled, Insulin Dependent  Renal/GU CRFRenal disease  negative genitourinary   Musculoskeletal   Abdominal   Peds  Hematology negative hematology ROS (+)   Anesthesia Other Findings Past Medical History:   Mini stroke                                                  Asthma                                                       Diabetes mellitus                                            Hypertension                                                 Breast cancer, left                                          Lymphedema                                                   COPD (chronic obstructive pulmonary disease)                 Arthritis                                                    Bronchiectasis  Hypercholesterolemia                                          Barrett's esophagus                                          Osteoporosis                                                 Reproductive/Obstetrics negative OB ROS                             Anesthesia Physical Anesthesia Plan  ASA: III  Anesthesia Plan: MAC   Post-op Pain Management:    Induction:   Airway Management Planned:   Additional Equipment:   Intra-op Plan:   Post-operative Plan:   Informed Consent: I have reviewed the patients History and Physical, chart, labs and discussed the procedure including the risks, benefits and alternatives for the proposed anesthesia with the patient or authorized representative who has indicated his/her understanding and acceptance.   Dental Advisory Given  Plan Discussed with: Anesthesiologist, CRNA and Surgeon  Anesthesia Plan Comments:         Anesthesia Quick Evaluation

## 2015-03-21 NOTE — Progress Notes (Signed)
Pt returned to room AAOX3 no pain complaints. Right arm with stabilizer in place. Dressing to right upper chest intact no drainage. VS stable . Education given on right arm post surgery care

## 2015-03-21 NOTE — Progress Notes (Signed)
Inpatient Diabetes Program Recommendations  AACE/ADA: New Consensus Statement on Inpatient Glycemic Control (2015)  Target Ranges:  Prepandial:   less than 140 mg/dL      Peak postprandial:   less than 180 mg/dL (1-2 hours)      Critically ill patients:  140 - 180 mg/dL   Review of Glycemic Control:  Results for Teresa Krueger, HIMMELBERGER (MRN [id number]) as of 03/21/2015 09:15  Ref. Range 03/20/2015 16:34 03/20/2015 20:09 03/21/2015 00:00 03/21/2015 04:40 03/21/2015 07:17  Glucose-Capillary Latest Ref Range: 65-99 mg/dL 136 (H) 253 (H) 215 (H) 160 (H) 171 (H)   Diabetes history: Type 2 diabetes Outpatient Diabetes medications: Humalog 75-25 20 units bid Current orders for Inpatient glycemic control:  TCTS tid with meals and HS  Inpatient Diabetes Program Recommendations:    Please change Novolog correction to moderate tid with meals and HS--Per "Glycemic Control Order Set".  May consider adding Lantus 10 units daily while patient is in the hospital.  Thanks, Adah Perl, RN, BC-ADM Inpatient Diabetes Coordinator Pager [phone]  (8a-5p)

## 2015-03-21 NOTE — Transfer of Care (Signed)
Immediate Anesthesia Transfer of Care Note  Patient: Teresa Krueger  Procedure(s) Performed: Procedure(s): INSERTION PACEMAKER (Right)  Patient Location: PACU  Anesthesia Type:MAC  Level of Consciousness: awake, alert  and oriented  Airway & Oxygen Therapy: Patient Spontanous Breathing and Patient connected to face mask oxygen  Post-op Assessment: Report given to RN and Post -op Vital signs reviewed and stable  Post vital signs: Reviewed and stable  Last Vitals:  Filed Vitals:   03/20/15 2007  BP: 171/44  Pulse: 86  Temp: 36.7 C  Resp: 20    Complications: No apparent anesthesia complications

## 2015-03-21 NOTE — Anesthesia Procedure Notes (Signed)
Procedure Name: MAC Performed by: BEANE, CATHERINE Pre-anesthesia Checklist: Patient identified, Emergency Drugs available, Suction available, Patient being monitored and Timeout performed Oxygen Delivery Method: Simple face mask       

## 2015-03-21 NOTE — Progress Notes (Signed)
Patient ID: Teresa Krueger, female   DOB: 08-Sep-1933, 81 y.o.   MRN: [id number] SUBJECTIVE:  Admitted with c/o generalized weakness and increased fatigue, near syncope.  FOund to have 2nd degree AVB as well as hypoglycemia.  Glucose now up.  To go for PPM; has many questions about this.  No new events overnight. ______________________________________________________________________  ROS: Please see HPI; remainder of complete 10 point ROS is negative   Past Medical History  Diagnosis Date  . Mini stroke   . Asthma   . Diabetes mellitus   . Hypertension   . Breast cancer, left   . Lymphedema   . COPD (chronic obstructive pulmonary disease)   . Arthritis   . Bronchiectasis   . Hypercholesterolemia   . Barrett's esophagus   . Osteoporosis     Past Surgical History  Procedure Laterality Date  . Breast lumpectomy  1999    left  . Mastectomy  1999    left  . Total hip arthroplasty  2001    left  . Gallbladder surgery    . Cataract extraction      bilateral  . Carpal tunnel release      right  . Trigger finger release      bilateral     Current facility-administered medications:  .  acetaminophen (TYLENOL) tablet 650 mg, 650 mg, Oral, Q6H PRN, 650 mg at 03/20/15 0751 **OR** acetaminophen (TYLENOL) suppository 650 mg, 650 mg, Rectal, Q6H PRN, Lytle Butte, MD .  ALPRAZolam Duanne Moron) tablet 0.5 mg, 0.5 mg, Oral, TID PRN, Lytle Butte, MD, 0.5 mg at 03/21/15 0235 .  alum & mag hydroxide-simeth (MAALOX/MYLANTA) 200-200-20 MG/5ML suspension 30 mL, 30 mL, Oral, Q4H PRN, Lytle Butte, MD .  calcium-vitamin D (OSCAL WITH D) 500-200 MG-UNIT per tablet 1 tablet, 1 tablet, Oral, Daily, Lytle Butte, MD, 1 tablet at 03/20/15 0950 .  fluticasone (FLONASE) 50 MCG/ACT nasal spray 2 spray, 2 spray, Each Nare, Daily, Lytle Butte, MD, 2 spray at 03/20/15 [phone] .  hydrALAZINE (APRESOLINE) injection 10 mg, 10 mg, Intravenous, Q4H PRN, Lytle Butte, MD, 10 mg at 03/16/15 2311 .  Influenza  vac split quadrivalent PF (FLUARIX) injection 0.5 mL, 0.5 mL, Intramuscular, Tomorrow-1000, Tama High III, MD .  insulin aspart (novoLOG) injection 0-24 Units, 0-24 Units, Subcutaneous, TID AC & HS, Tama High III, MD .  losartan (COZAAR) tablet 25 mg, 25 mg, Oral, Daily, Lytle Butte, MD, 25 mg at 03/20/15 0950 .  mometasone-formoterol (DULERA) 100-5 MCG/ACT inhaler 2 puff, 2 puff, Inhalation, BID, Lytle Butte, MD, 2 puff at 03/20/15 2205 .  multivitamin with minerals tablet 1 tablet, 1 tablet, Oral, Daily, Lytle Butte, MD, 1 tablet at 03/20/15 [phone] .  ondansetron (ZOFRAN) tablet 4 mg, 4 mg, Oral, Q6H PRN **OR** ondansetron (ZOFRAN) injection 4 mg, 4 mg, Intravenous, Q6H PRN, Lytle Butte, MD .  pantoprazole (PROTONIX) EC tablet 40 mg, 40 mg, Oral, Daily, Lytle Butte, MD, 40 mg at 03/20/15 0950 .  simvastatin (ZOCOR) tablet 20 mg, 20 mg, Oral, Daily, Lytle Butte, MD, 20 mg at 03/20/15 2205 .  sodium chloride 0.9 % injection 3 mL, 3 mL, Intravenous, PRN, Tama High III, MD .  spironolactone (ALDACTONE) tablet 25 mg, 25 mg, Oral, Daily, Kirk Ruths, MD, 25 mg at 03/20/15 0950 .  sucralfate (CARAFATE) tablet 1 g, 1 g, Oral, QID, Lytle Butte, MD, 1 g at 03/20/15 1111 .  torsemide (DEMADEX) tablet 5 mg, 5 mg, Oral, Daily, Kirk Ruths, MD, 5 mg at 03/20/15 0951  PHYSICAL EXAM:  BP 171/44 mmHg  Pulse 86  Temp(Src) 98.1 F (36.7 C) (Oral)  Resp 20  Ht 5' (1.524 m)  Wt 134.945 kg (297 lb 8 oz)  BMI 58.10 kg/m2  SpO2 94%  General: morbidly obese  female, in NAD HEENT: PERRL; OP moist without lesions. Neck: supple, trachea midline, no thyromegaly Chest: normal to palpation Lungs: clear bilaterally without retractions or wheezes Cardiovascular: RRR with occ ectopy, distant heart sounds with 2/6 murmur; distal pulses 2+ Abdomen: soft, nontender, nondistended, positive bowel sounds Extremities: no clubbing, cyanosis. Trace leg edema with venous stasis changes.   Redness in right elbow from IV infiltration, improved Neuro: alert and oriented, moves all extremities. Pain limits ROM in both arms Derm: no significant rashes or nodules; good skin turgor Lymph: no cervical or supraclavicular lymphadenopathy  Labs and imaging studies were reviewed  ASSESSMENT/PLAN:   1. Near syncope/2nd degree AVB- to PPM today; will need to mobilize pt afterward to see what symptoms remain, as well as whether physically she will be able to go home, given her baseline limited mobility 2. Accelerated HTN- BP improved; unable to lower systolic further without worsening her diastolic pressrues 3. OSA- overnight oximetry with brief desats; anticipate outpt sleep study 4. Superficial thrombophlebitis of right arm- appears to be improving with kpad, elevation 5. GERD/IBS- cont home regimen; no new GI symptoms 6. COPD- breathing stable 7. Anxiety- cont xanax PRN 8. AODM- cont to cover with SSI.

## 2015-03-21 NOTE — Care Management (Signed)
Spoke with attending. Patient will have pacemaker insertion today.  Will need to have physical therapy work with patient again tomorrow (9/28) to determine the extent of any limitations.  Device will be placed on the right side due to previous left breast surgery.  Right side is patient's dominant side and there will be restriction.  Patient's mobility at baseline is very limited and there is concern that family will not be able to meet care needs.  Discussed during progression. Anticipate discharge within next 24 hours.  Obtained OT order to assess 9/28.

## 2015-03-21 NOTE — Progress Notes (Signed)
PT Cancellation Note  Patient Details Name: CHELISA HENNEN MRN: [id number] DOB: 1934-02-29   Cancelled Treatment:    Reason Eval/Treat Not Completed: Other (comment) (See PT note for further details) Per chart review and communication with nurse, pt currently at procedure for pacemaker placement. PT will attempt at later time/date if new orders are issued. Nurse notified it's required for continued PT services.    Janyth Contes 03/21/2015, 11:26 AM  Janyth Contes, SPT. [phone]

## 2015-03-22 ENCOUNTER — Encounter
Admission: RE | Admit: 2015-03-22 | Discharge: 2015-03-22 | Disposition: A | Discharge status: Home / Self Care | Payer: Medicare Other | Source: Ambulatory Visit | Attending: Internal Medicine | Admitting: Internal Medicine

## 2015-03-22 DIAGNOSIS — I441 Atrioventricular block, second degree: Secondary | ICD-10-CM | POA: Diagnosis not present

## 2015-03-22 LAB — CBC
HEMATOCRIT: 38.3 % (ref 35.0–47.0)
HEMOGLOBIN: 12.9 g/dL (ref 12.0–16.0)
MCH: 29.3 pg (ref 26.0–34.0)
MCHC: 33.6 g/dL (ref 32.0–36.0)
MCV: 87.1 fL (ref 80.0–100.0)
Platelets: 158 10*3/uL (ref 150–440)
RBC: 4.39 MIL/uL (ref 3.80–5.20)
RDW: 13.6 % (ref 11.5–14.5)
WBC: 6.2 10*3/uL (ref 3.6–11.0)

## 2015-03-22 LAB — BASIC METABOLIC PANEL
Anion gap: 9 (ref 5–15)
BUN: 23 mg/dL — AB (ref 6–20)
CHLORIDE: 107 mmol/L (ref 101–111)
CO2: 24 mmol/L (ref 22–32)
Calcium: 9.1 mg/dL (ref 8.9–10.3)
Creatinine, Ser: 0.86 mg/dL (ref 0.44–1.00)
GFR calc Af Amer: >60 mL/min (ref >60)
GFR calc non Af Amer: >60 mL/min (ref >60)
GLUCOSE: 153 mg/dL — AB (ref 65–99)
POTASSIUM: 4 mmol/L (ref 3.5–5.1)
Sodium: 140 mmol/L (ref 135–145)

## 2015-03-22 LAB — GLUCOSE, CAPILLARY
GLUCOSE-CAPILLARY: 162 mg/dL — AB (ref 65–99)
GLUCOSE-CAPILLARY: 167 mg/dL — AB (ref 65–99)
GLUCOSE-CAPILLARY: 221 mg/dL — AB (ref 65–99)
Glucose-Capillary: 133 mg/dL — ABNORMAL HIGH (ref 65–99)
Glucose-Capillary: 138 mg/dL — ABNORMAL HIGH (ref 65–99)
Glucose-Capillary: 154 mg/dL — ABNORMAL HIGH (ref 65–99)

## 2015-03-22 MED ORDER — TIZANIDINE HCL 4 MG PO TABS: 4.0000 mg | ORAL_TABLET | Freq: Four times a day (QID) | ORAL | Status: DC | PRN

## 2015-03-22 MED ORDER — INSULIN LISPRO PROT & LISPRO (75-25 MIX) 100 UNIT/ML ~~LOC~~ SUSP: 15.0000 [IU] | Freq: Two times a day (BID) | SUBCUTANEOUS | Status: DC

## 2015-03-22 MED ORDER — CLARITHROMYCIN 250 MG PO TABS: 250.0000 mg | ORAL_TABLET | Freq: Two times a day (BID) | ORAL | Status: AC

## 2015-03-22 MED ORDER — BISACODYL 10 MG RE SUPP
10.0000 mg | Freq: Once | RECTAL | Status: AC
  Administered 2015-03-22: 10 mg via RECTAL
  Filled 2015-03-22: qty 1

## 2015-03-22 MED ORDER — TORSEMIDE 5 MG PO TABS: 5.0000 mg | ORAL_TABLET | Freq: Every day | ORAL | Status: DC

## 2015-03-22 MED ORDER — ALPRAZOLAM 0.5 MG PO TABS: 0.5000 mg | ORAL_TABLET | Freq: Three times a day (TID) | ORAL | Status: DC | PRN

## 2015-03-22 NOTE — Clinical Social Work Placement (Signed)
   CLINICAL SOCIAL WORK PLACEMENT  NOTE  Date:  03/22/2015  Patient Details  Name: Teresa Krueger MRN: [id number] Date of Birth: [birth year]  Clinical Social Work is seeking post-discharge placement for this patient at the [place] level of care (*CSW will initial, date and re-position this form in  chart as items are completed):  Yes   Patient/family provided with [place] Work Department's list of facilities offering this level of care within the geographic area requested by the patient (or if unable, by the patient's family).  Yes   Patient/family informed of their freedom to choose among providers that offer the needed level of care, that participate in Medicare, Medicaid or managed care program needed by the patient, have an available bed and are willing to accept the patient.  Yes   Patient/family informed of [facility]'s ownership interest in [facility] and [facility], as well as of the fact that they are under no obligation to receive care at these facilities.  PASRR submitted to EDS on 03/22/15     PASRR number received on 03/22/15     Existing PASRR number confirmed on       FL2 transmitted to all facilities in geographic area requested by pt/family on 03/22/15     FL2 transmitted to all facilities within larger geographic area on       Patient informed that his/her managed care company has contracts with or will negotiate with certain facilities, including the following:            Patient/family informed of bed offers received.  Patient chooses bed at       Physician recommends and patient chooses bed at      Patient to be transferred to   on  .  Patient to be transferred to facility by       Patient family notified on   of transfer.  Name of family member notified:        PHYSICIAN Please sign FL2     Additional Comment:    _______________________________________________ Mathews Argyle,  LCSW 03/22/2015, 10:12 AM

## 2015-03-22 NOTE — Anesthesia Postprocedure Evaluation (Signed)
  Anesthesia Post-op Note  Patient: Teresa Krueger  Procedure(s) Performed: Procedure(s): INSERTION PACEMAKER (Right)  Anesthesia type:MAC  Patient location: PACU  Post pain: Pain level controlled  Post assessment: Post-op Vital signs reviewed, Patient's Cardiovascular Status Stable, Respiratory Function Stable, Patent Airway and No signs of Nausea or vomiting  Post vital signs: Reviewed and stable  Last Vitals:  Filed Vitals:   03/22/15 1147  BP: 138/62  Pulse: 75  Temp: 36.8 C  Resp: 20    Level of consciousness: awake, alert  and patient cooperative  Complications: No apparent anesthesia complications

## 2015-03-22 NOTE — Care Management Important Message (Signed)
Important Message  Patient Details  Name: WENONA MAYVILLE MRN: [id number] Date of Birth: March 22, 1934   Medicare Important Message Given:  Yes-third notification given    Juliann Pulse A Allmond 03/22/2015, 3:04 PM

## 2015-03-22 NOTE — Clinical Social Work Note (Signed)
CSW notified pt, RN and facility that pt would DC today via EMS to [facility].  CSW signing off.

## 2015-03-22 NOTE — Clinical Social Work Note (Signed)
Clinical Social Work Assessment  Patient Details  Name: Teresa Krueger MRN: [id number] Date of Birth: 1934-01-30  Date of referral:  03/22/15               Reason for consult:  Facility Placement                Permission sought to share information with:  Facility Sport and exercise psychologist, Family Supports Permission granted to share information::  Yes, Verbal Permission Granted  Name::     Joslyn [facility]- [address] [place]::  [facility]  Relationship::     Contact Information:     Housing/Transportation Living arrangements for the past 2 months:  [place] of Information:  Patient, Medical Team Patient Interpreter Needed:  None Criminal Activity/Legal Involvement Pertinent to Current Situation/Hospitalization:  No - Comment as needed Significant Relationships:  Adult Children, [place], Spouse Lives with:  Spouse Do you feel safe going back to the place where you live?  No Need for family participation in patient care:  No (Coment)  Care giving concerns:  Current concerns are being able to go to [facility] for SNF placement.   Social Worker assessment / plan:  CSW spoke to pt.  She stated that she was in agreement with SNF placement, and would prefer [facility].  Prior to hospital visit she was able to use a rolaid walker around the house and a wheel chair.  She stated that she was unable to walk long distances.  She stated that he husband also has health problems.  He is on O2 and also uses a wheel chair for long distances.  Pt stated that she is close with her church community, but expressed that she still would like to try for SNF for rehab.  CSW stated that she would reach out to [facility] to see if anything is available.    Employment status:  Retired Forensic scientist:  Medicare PT Recommendations:  Home with [place] / Referral to community resources:  [place]  Patient/Family's Response to care:  Pt was in favor of  DC to SNF.  Patient/Family's Understanding of and Emotional Response to Diagnosis, Current Treatment, and Prognosis:  Pt verbalized her understanding of SNF placement and that [facility] was not a guarenteed placement.  Pt was still in agreement with going to SNF for DC.    Emotional Assessment Appearance:  Appears stated age Attitude/Demeanor/Rapport:   (a little anxious that she might not get into [employer]) Affect (typically observed):  Apprehensive Orientation:  Oriented to Self, Oriented to Place, Oriented to  Time, Oriented to Situation Alcohol / Substance use:  Never Used Psych involvement (Current and /or in the community):  No (Comment)  Discharge Needs  Concerns to be addressed:  Care Coordination Readmission within the last 30 days:  No Current discharge risk:  Physical Impairment Barriers to Discharge:  No Barriers Identified   Mathews Argyle, LCSW 03/22/2015, 9:41 AM

## 2015-03-22 NOTE — Clinical Social Work Placement (Signed)
   CLINICAL SOCIAL WORK PLACEMENT  NOTE  Date:  03/22/2015  Patient Details  Name: Teresa Krueger MRN: [id number] Date of Birth: Dec 02, 1933  Clinical Social Work is seeking post-discharge placement for this patient at the [place] level of care (*CSW will initial, date and re-position this form in  chart as items are completed):  Yes   Patient/family provided with [place] Work Department's list of facilities offering this level of care within the geographic area requested by the patient (or if unable, by the patient's family).  Yes   Patient/family informed of their freedom to choose among providers that offer the needed level of care, that participate in Medicare, Medicaid or managed care program needed by the patient, have an available bed and are willing to accept the patient.  Yes   Patient/family informed of [facility] [place]'s ownership interest in [facility] and [facility], as well as of the fact that they are under no obligation to receive care at these facilities.  PASRR submitted to EDS on 03/22/15     PASRR number received on 03/22/15     Existing PASRR number confirmed on       FL2 transmitted to all facilities in geographic area requested by pt/family on 03/22/15     FL2 transmitted to all facilities within larger geographic area on       Patient informed that his/her managed care company has contracts with or will negotiate with certain facilities, including the following:        Yes   Patient/family informed of bed offers received.  Patient chooses bed at  [facility])     Physician recommends and patient chooses bed at      Patient to be transferred to  [facility]) on 03/22/15.  Patient to be transferred to facility by  (EMS)     Patient family notified on 03/22/15 of transfer.  Name of family member notified:  Pt notified.     PHYSICIAN Please sign FL2     Additional Comment:     _______________________________________________ Mathews Argyle, LCSW 03/22/2015, 10:25 AM

## 2015-03-22 NOTE — Discharge Summary (Signed)
Physician Discharge Summary  Patient ID: Teresa Krueger MRN: [id number] DOB/AGE: 1934/03/08 81 y.o.  Admit date: 03/16/2015 Discharge date: 03/22/2015  Admission Diagnoses:  Discharge Diagnoses:  Principal Problem:   Mobitz type 2 second degree AV block Active Problems:   Diabetes mellitus   Hypertension   Chronic bronchitis   GERD (gastroesophageal reflux disease)   Morbid obesity   Hypersomnia with sleep apnea   Discharged Condition: stable  Hospital Course: Admitted with hypoglycemia, dizziness, weakness; found to have 2nd degree AV block.   Cardiology evaluated pt, and she underwent echo and stress testing, without significant abnormalities noted.  She ambulated with PT, and was noted to have episodes of HR falling into 40's and 50's, consistent with chronotropic incompetence.  She underwent PPM placement, with improvement in HR and symptoms  Glucose improved after initial low glucose; FSBS remained slightly elevated after this.  She will be continued on insulin BID and glucose monitored.  Diuretics were adjusted, with good UOP. Electrolytes remained stable.    Pt noted to have infiltration of IV in right elbow, with superficial thrombophlebitis.  This improved with heat pad and elevation.  She was started on Biaxin after PPM, which should provide further therapy for any cellulitis.   BP remained stable during hospitalization, though had noted to have low diastolic pressures, improved after PPM.  Overnight oximetry revealed brief desaturations; did not require nocturnal oxygen, but consider sleep study as outpt.    Pt had various c/o pain due to diffuse DDD/DJD; she is limited in pain medications available due to multiple drug intolerances.  Tizanidine was added and tylenol used.  Xanax was continued for her anxiety  After PPM, pt was not able to perform ADLs or transfer without significant help and needs STR.  She will be discharged in stable condition, physical activity up  with assistance with walker as tolerated, with pacemaker precautions to right arm.  Diet should be NAS, no concentrated sweets.  PT and OT should evaluate and treat the patient.  Further treatment is left to the NH MD.   Consults: cardiology   Discharge Exam: Blood pressure 138/62, pulse 75, temperature 98.3 F (36.8 C), temperature source Oral, resp. rate 20, height 5' (1.524 m), weight 134.945 kg (297 lb 8 oz), SpO2 95 %.   Disposition:   Discharge Instructions    Diet - low sodium heart healthy    Complete by:  As directed      Increase activity slowly    Complete by:  As directed   Pacemaker restrictions in right arm; no blood draws/BP in left arm; up with walker as tolerated            Medication List    STOP taking these medications        guaiFENesin-dextromethorphan 100-10 MG/5ML syrup  Commonly known as:  ROBITUSSIN DM     nystatin cream  Commonly known as:  MYCOSTATIN     potassium chloride SA 20 MEQ tablet  Commonly known as:  K-DUR,KLOR-CON      TAKE these medications        acetaminophen 500 MG tablet  Commonly known as:  TYLENOL  Take 500 mg by mouth 3 (three) times daily.     albuterol 108 (90 BASE) MCG/ACT inhaler  Commonly known as:  PROVENTIL HFA;VENTOLIN HFA  Inhale 2 puffs into the lungs every 6 (six) hours as needed for wheezing or shortness of breath.     ALPRAZolam 0.5 MG tablet  Commonly known as:  XANAX  Take 1 tablet (0.5 mg total) by mouth 3 (three) times daily as needed for anxiety.     CALCIUM 500 +D 500-400 MG-UNIT Tabs  Generic drug:  Calcium Carb-Cholecalciferol  Take 1 tablet by mouth daily.     clarithromycin 250 MG tablet  Commonly known as:  BIAXIN  Take 1 tablet (250 mg total) by mouth 2 (two) times daily. X 7 days     fluticasone 50 MCG/ACT nasal spray  Commonly known as:  FLONASE  Place 2 sprays into both nostrils daily.     Fluticasone-Salmeterol 250-50 MCG/DOSE Aepb  Commonly known as:  ADVAIR  Inhale 1 puff into  the lungs every 12 (twelve) hours.     insulin lispro protamine-lispro (75-25) 100 UNIT/ML Susp injection  Commonly known as:  HUMALOG 75/25 MIX  Inject 15 Units into the skin 2 (two) times daily.     lidocaine 5 %  Commonly known as:  LIDODERM  Place 2 patches onto the skin daily. Remove & Discard patch within 12 hours or as directed by MD     losartan 25 MG tablet  Commonly known as:  COZAAR  Take 25 mg by mouth daily.     multivitamin with minerals Tabs tablet  Take 1 tablet by mouth daily.     omeprazole 20 MG capsule  Commonly known as:  PRILOSEC  Take 1 capsule (20 mg total) by mouth 2 (two) times daily.     simvastatin 20 MG tablet  Commonly known as:  ZOCOR  Take 20 mg by mouth daily.     spironolactone 25 MG tablet  Commonly known as:  ALDACTONE  Take 25 mg by mouth daily.     sucralfate 1 G tablet  Commonly known as:  CARAFATE  Take 1 g by mouth 4 (four) times daily -  with meals and at bedtime.     tiZANidine 4 MG tablet  Commonly known as:  ZANAFLEX  Take 1 tablet (4 mg total) by mouth every 6 (six) hours as needed for muscle spasms.     torsemide 5 MG tablet  Commonly known as:  DEMADEX  Take 1 tablet (5 mg total) by mouth daily.         Signed: Tama High III 03/22/2015, 12:51 PM

## 2015-03-22 NOTE — Evaluation (Signed)
Physical Therapy Re-evaluation Patient Details Name: Teresa Krueger MRN: [id number] DOB: 01-25-1934 Today's Date: 03/22/2015   History of Present Illness  Patient is a 81 y.o. female admitted for worsening dyspnea and feelings of syncope on 22 September. Pt with pacemaker placed 03/20/15  Clinical Impression  Pt presents with hx of TIA, DM, HTN, breast cancer, COPD, and osteoporosis. Examination reveals that pt performs transfers at [facility] assist and ambulation at min assist. Pt requires heavy cues with all mobility in order to perform them safely and properly. She has generalized weakness and decreased activity tolerance that will continue to benefit from skilled PT. Pt fatigued shortly into ambulation with HR response of 80 bpm at rest and 95 bpm post-ambulation. HR returned to mid 80s after two mintues of resting in recliner.     Follow Up Recommendations SNF    Equipment Recommendations  None recommended by PT    Recommendations for Other Services       Precautions / Restrictions Precautions Precautions: Fall;ICD/Pacemaker Restrictions Weight Bearing Restrictions: No      Mobility  Bed Mobility               General bed mobility comments: Pt received in recliner; not assessed this date  Transfers Overall transfer level: Needs assistance Equipment used:  (BRW) Transfers: Sit to/from Stand Sit to Stand: Mod assist         General transfer comment: Pt needs fair assist from therapist to get fully into standing with BRW. Needs cues not to actively use her RUE due to pacemaker precautions. Pt needs cues to stand up tall to alleviate LBP   Ambulation/Gait Ambulation/Gait assistance: Min assist Ambulation Distance (Feet): 8 Feet Assistive device:  (BRW) Gait Pattern/deviations: Step-to pattern;Shuffle;Decreased step length - right;Decreased step length - left Gait velocity: decreased Gait velocity interpretation: <1.8 ft/sec, indicative of risk for recurrent  falls General Gait Details: Pt needs assist to advance RW as well as cues on how to sequence her gait with her walker. Pt is impulsive at times and needs to be cued by therapist to slow down at times.   Stairs            Wheelchair Mobility    Modified Rankin (Stroke Patients Only)       Balance Overall balance assessment: Modified Independent                                           Pertinent Vitals/Pain Pain Assessment: 0-10 Pain Score: 6  Pain Location: lower back Pain Descriptors / Indicators: Aching Pain Intervention(s): Limited activity within patient's tolerance;Monitored during session;Repositioned    Home Living   Living Arrangements: Spouse/significant other Available Help at Discharge: Family Type of Home: House Home Access: Level entry     Home Layout: One level;Other (Comment) (ramps within home) Home Equipment: Walker - 4 wheels;Wheelchair - power;Grab bars - toilet;Grab bars - tub/shower;Shower seat - built in Additional Comments: Patient ambulates very minimally within home. Wheelchair used in community.    Prior Function Level of Independence: Needs assistance   Gait / Transfers Assistance Needed: Modified independent with rollator; relies on wheelchair  ADL's / Homemaking Assistance Needed: Performed by husband        Hand Dominance        Extremity/Trunk Assessment   Upper Extremity Assessment:  (Difficult to assess due to pacemaker precautions)  Lower Extremity Assessment: RLE deficits/detail RLE Deficits / Details: Lacking terminal knee extension; cellulitis LLE Deficits / Details: Weakness: 3+/5 gross MMT     Communication   Communication: No difficulties  Cognition Arousal/Alertness: Awake/alert Behavior During Therapy: WFL for tasks assessed/performed Overall Cognitive Status: Within Functional Limits for tasks assessed                      General Comments      Exercises Other  Exercises Other Exercises: Pt performed bilateral therex x 12 reps at min assist for proper technique: LAQ, ankle pumps, clamshells, quad sets, and glute sets.       Assessment/Plan    PT Assessment Patient needs continued PT services  PT Diagnosis Difficulty walking;Generalized weakness   PT Problem List Decreased strength;Decreased range of motion;Decreased activity tolerance;Decreased mobility;Obesity  PT Treatment Interventions Gait training;Functional mobility training;Therapeutic activities;Therapeutic exercise;Patient/family education;Stair training;Balance training;Wheelchair mobility training   PT Goals (Current goals can be found in the Care Plan section) Acute Rehab PT Goals Patient Stated Goal: To be able to walk around her house PT Goal Formulation: With patient Time For Goal Achievement: 04/02/15 Potential to Achieve Goals: Good    Frequency Min 2X/week   Barriers to discharge        Co-evaluation               End of Session Equipment Utilized During Treatment: Gait belt Activity Tolerance: Patient tolerated treatment well;Patient limited by fatigue Patient left: in chair;with call bell/phone within reach;with chair alarm set Nurse Communication: Mobility status         Time: 7353-2992 PT Time Calculation (min) (ACUTE ONLY): 25 min   Charges:         PT G CodesJanyth Contes 2015/04/03, 11:20 AM  Janyth Contes, SPT. [phone]

## 2015-03-22 NOTE — Progress Notes (Signed)
Patient ID: Teresa Krueger, female   DOB: 01-09-1934, 81 y.o.   MRN: [id number] SUBJECTIVE:  Admitted with c/o generalized weakness and increased fatigue, near syncope.  Found to have 2nd degree AVB as well as hypoglycemia.  Underwent PPM yesterday; no major problems other than some discomfort reported by patient.  BP improved; glucose remains elevated.  Oral intake reasonable.  Didn't sleep well, but this is not new. ______________________________________________________________________  ROS: Please see HPI; remainder of complete 10 point ROS is negative   Past Medical History  Diagnosis Date  . Mini stroke   . Asthma   . Diabetes mellitus   . Hypertension   . Breast cancer, left   . Lymphedema   . COPD (chronic obstructive pulmonary disease)   . Arthritis   . Bronchiectasis   . Hypercholesterolemia   . Barrett's esophagus   . Osteoporosis     Past Surgical History  Procedure Laterality Date  . Breast lumpectomy  1999    left  . Mastectomy  1999    left  . Total hip arthroplasty  2001    left  . Gallbladder surgery    . Cataract extraction      bilateral  . Carpal tunnel release      right  . Trigger finger release      bilateral  . Pacemaker insertion Right 03/21/2015    Procedure: INSERTION PACEMAKER;  Surgeon: Isaias Cowman, MD;  Location: [facility] ORS;  Service: Cardiovascular;  Laterality: Right;     Current facility-administered medications:  .  [DISCONTINUED] acetaminophen (TYLENOL) tablet 650 mg, 650 mg, Oral, Q6H PRN, 650 mg at 03/20/15 0751 **OR** acetaminophen (TYLENOL) suppository 650 mg, 650 mg, Rectal, Q6H PRN, Lytle Butte, MD .  acetaminophen (TYLENOL) tablet 325-650 mg, 325-650 mg, Oral, Q4H PRN, Isaias Cowman, MD, 650 mg at 03/21/15 2050 .  ALPRAZolam Duanne Moron) tablet 0.5 mg, 0.5 mg, Oral, TID PRN, Lytle Butte, MD, 0.5 mg at 03/22/15 0600 .  alum & mag hydroxide-simeth (MAALOX/MYLANTA) 200-200-20 MG/5ML suspension 30 mL, 30 mL, Oral, Q4H  PRN, Lytle Butte, MD, 30 mL at 03/21/15 1838 .  calcium-vitamin D (OSCAL WITH D) 500-200 MG-UNIT per tablet 1 tablet, 1 tablet, Oral, Daily, Lytle Butte, MD, 1 tablet at 03/20/15 0950 .  clarithromycin (BIAXIN) tablet 250 mg, 250 mg, Oral, Q12H, Isaias Cowman, MD, 250 mg at 03/21/15 2050 .  fluticasone (FLONASE) 50 MCG/ACT nasal spray 2 spray, 2 spray, Each Nare, Daily, Lytle Butte, MD, 2 spray at 03/20/15 [phone] .  hydrALAZINE (APRESOLINE) injection 10 mg, 10 mg, Intravenous, Q4H PRN, Lytle Butte, MD, 10 mg at 03/16/15 2311 .  Influenza vac split quadrivalent PF (FLUARIX) injection 0.5 mL, 0.5 mL, Intramuscular, Tomorrow-1000, Tama High III, MD .  insulin aspart (novoLOG) injection 0-24 Units, 0-24 Units, Subcutaneous, TID AC & HS, Tama High III, MD, 0 Units at 03/21/15 0800 .  losartan (COZAAR) tablet 25 mg, 25 mg, Oral, Daily, Lytle Butte, MD, 25 mg at 03/20/15 0950 .  mometasone-formoterol (DULERA) 100-5 MCG/ACT inhaler 2 puff, 2 puff, Inhalation, BID, Lytle Butte, MD, 2 puff at 03/21/15 2050 .  multivitamin with minerals tablet 1 tablet, 1 tablet, Oral, Daily, Lytle Butte, MD, 1 tablet at 03/20/15 [phone] .  ondansetron (ZOFRAN) tablet 4 mg, 4 mg, Oral, Q6H PRN **OR** ondansetron (ZOFRAN) injection 4 mg, 4 mg, Intravenous, Q6H PRN, Lytle Butte, MD .  pantoprazole (PROTONIX) EC tablet 40 mg, 40 mg,  Oral, Daily, Lytle Butte, MD, 40 mg at 03/20/15 0950 .  simvastatin (ZOCOR) tablet 20 mg, 20 mg, Oral, Daily, Lytle Butte, MD, 20 mg at 03/20/15 2205 .  sodium chloride 0.9 % injection 3 mL, 3 mL, Intravenous, PRN, Tama High III, MD .  spironolactone (ALDACTONE) tablet 25 mg, 25 mg, Oral, Daily, Kirk Ruths, MD, 25 mg at 03/20/15 0950 .  sucralfate (CARAFATE) tablet 1 g, 1 g, Oral, QID, Lytle Butte, MD, 1 g at 03/22/15 0600 .  torsemide (DEMADEX) tablet 5 mg, 5 mg, Oral, Daily, Kirk Ruths, MD, 5 mg at 03/20/15 0951  PHYSICAL EXAM:  BP 128/58 mmHg   Pulse 70  Temp(Src) 98.2 F (36.8 C) (Oral)  Resp 18  Ht 5' (1.524 m)  Wt 134.945 kg (297 lb 8 oz)  BMI 58.10 kg/m2  SpO2 96%  General: morbidly obese  female, in NAD HEENT: PERRL; OP moist without lesions. Neck: supple, trachea midline, no thyromegaly Chest: PPM right chest Lungs: clear bilaterally without retractions or wheezes Cardiovascular: RRR with occ ectopy, distant heart sounds with 2/6 murmur; distal pulses 2+ Abdomen: soft, nontender, nondistended, positive bowel sounds Extremities: no clubbing, cyanosis. Trace leg edema with venous stasis changes.  Redness in right elbow from IV infiltration much improved Neuro: alert and oriented, moves all extremities. Pain limits ROM in both arms Derm: no significant rashes or nodules; good skin turgor Lymph: no cervical or supraclavicular lymphadenopathy  Labs and imaging studies were reviewed  ASSESSMENT/PLAN:   1. Near syncope/2nd degree AVB- s/p PPM.  Following HR, BP 2. Accelerated HTN- BP improved; following 3. OSA- overnight oximetry showed very brief desats; anticipate outpt sleep study to further evaluate 4. Superficial thrombophlebitis of right arm- continues to improve with kpad, elevation 5. GERD/IBS-no new GI symptoms 6. COPD- dyspnea appears stable; following as she mobilizes 7. Anxiety- controlled with xanax PRN 8. AODM- stable with SSI 9. DDD/DJD- cont pain meds PRN 10. D/c plan- will ask PT to reevaluate, as pt now limited in ROM of RUE due to PPM.  Very likely will need STR, as she required sig assistance with ADLs at home prior to hospitalization

## 2015-03-22 NOTE — Evaluation (Signed)
Physical Therapy Evaluation Patient Details Name: Teresa Krueger MRN: [id number] DOB: 05/30/1934 Today's Date: 03/22/2015   History of Present Illness  Patient is a 81 y.o. female admitted for worsening dyspnea and feelings of syncope on 22 September. Pt with pacemaker placed 03/21/15  Clinical Impression   Pt presents with hx of TIA, DM, HTN, breast cancer, COPD, and osteoporosis. Examination reveals that pt performs transfers at [facility] assist and ambulation at min assist. Pt requires heavy cues with all mobility in order to perform them safely and properly. She has generalized weakness and decreased activity tolerance that will continue to benefit from skilled PT. Pt fatigued shortly into ambulation with HR response of 80 bpm at rest and 95 bpm post-ambulation. HR returned to mid 80s after two mintues of resting in recliner.            Follow Up Recommendations SNF    Equipment Recommendations  None recommended by PT    Recommendations for Other Services       Precautions / Restrictions Precautions Precautions: Fall;ICD/Pacemaker Restrictions Weight Bearing Restrictions: No      Mobility  Bed Mobility               General bed mobility comments: Pt received in recliner; not assessed this date  Transfers Overall transfer level: Needs assistance Equipment used:  (BRW) Transfers: Sit to/from Stand Sit to Stand: Mod assist         General transfer comment: Pt needs fair assist from therapist to get fully into standing with BRW. Needs cues not to actively use her RUE due to pacemaker precautions. Pt needs cues to stand up tall to alleviate LBP   Ambulation/Gait Ambulation/Gait assistance: Min assist Ambulation Distance (Feet): 8 Feet Assistive device:  (BRW) Gait Pattern/deviations: Step-to pattern;Shuffle;Decreased step length - right;Decreased step length - left Gait velocity: decreased Gait velocity interpretation: <1.8 ft/sec, indicative of risk for  recurrent falls General Gait Details: Pt needs assist to advance RW as well as cues on how to sequence her gait with her walker. Pt is impulsive at times and needs to be cued by therapist to slow down at times.   Stairs            Wheelchair Mobility    Modified Rankin (Stroke Patients Only)       Balance Overall balance assessment: Modified Independent                                           Pertinent Vitals/Pain Pain Assessment: 0-10 Pain Score: 6  Pain Location: lower back Pain Descriptors / Indicators: Aching Pain Intervention(s): Limited activity within patient's tolerance;Monitored during session;Repositioned    Home Living   Living Arrangements: Spouse/significant other Available Help at Discharge: Family Type of Home: House Home Access: Level entry     Home Layout: One level;Other (Comment) (ramps within home) Home Equipment: Walker - 4 wheels;Wheelchair - power;Grab bars - toilet;Grab bars - tub/shower;Shower seat - built in Additional Comments: Patient ambulates very minimally within home. Wheelchair used in community.    Prior Function Level of Independence: Needs assistance   Gait / Transfers Assistance Needed: Modified independent with rollator; relies on wheelchair  ADL's / Homemaking Assistance Needed: Performed by husband        Hand Dominance        Extremity/Trunk Assessment   Upper Extremity Assessment:  (Difficult to assess  due to pacemaker precautions)           Lower Extremity Assessment: RLE deficits/detail RLE Deficits / Details: Lacking terminal knee extension; cellulitis LLE Deficits / Details: Weakness: 3+/5 gross MMT     Communication   Communication: No difficulties  Cognition Arousal/Alertness: Awake/alert Behavior During Therapy: WFL for tasks assessed/performed Overall Cognitive Status: Within Functional Limits for tasks assessed                      General Comments       Exercises Other Exercises Other Exercises: Pt performed bilateral therex x 12 reps at min assist for proper technique: LAQ, ankle pumps, clamshells, quad sets, and glute sets.       Assessment/Plan    PT Assessment Patient needs continued PT services  PT Diagnosis Difficulty walking;Generalized weakness   PT Problem List Decreased strength;Decreased range of motion;Decreased activity tolerance;Decreased mobility;Obesity  PT Treatment Interventions Gait training;Functional mobility training;Therapeutic activities;Therapeutic exercise;Patient/family education;Stair training;Balance training;Wheelchair mobility training   PT Goals (Current goals can be found in the Care Plan section) Acute Rehab PT Goals Patient Stated Goal: To be able to walk around her house PT Goal Formulation: With patient Time For Goal Achievement: 04/02/15 Potential to Achieve Goals: Good    Frequency Min 2X/week   Barriers to discharge        Co-evaluation               End of Session Equipment Utilized During Treatment: Gait belt Activity Tolerance: Patient tolerated treatment well;Patient limited by fatigue Patient left: in chair;with call bell/phone within reach;with chair alarm set Nurse Communication: Mobility status         Time: 3343-5686 PT Time Calculation (min) (ACUTE ONLY): 25 min   Charges:         PT G CodesJanyth Contes 2015/04/08, 11:36 AM  Janyth Contes, SPT. [phone]

## 2015-03-22 NOTE — Progress Notes (Signed)
Report called to [facility], they stated they could not accept pt because pt has not had a BM since 9/24. Dr. [name] paged. Waiting orders. Will continue to monitor pt.  Maurene Capes RN

## 2015-03-22 NOTE — Progress Notes (Addendum)
Pt has orders to be discharged. Discharge instructions given and pt has no additional questions at this time. Medication regimen reviewed and pt educated. Pt verbalized understanding and has no additional questions. IV removed and site in good condition. Pt stable and waiting for transportation to [facility].  Maurene Capes RN

## 2015-03-22 NOTE — Care Management (Signed)
Patient would be in total agreement with short term skilled nursing if is recommended by physical therapy.  Her facility choice would be [facility].  Updated CSW

## 2015-03-22 NOTE — Care Management (Signed)
Was informed by primary nurse- Romelle Starcher- that patient has not had a bowel movement  In 4 days and can not go to skilled nursing until has a bowel movement.  Attending was called and order obtained for a suppository.  Continue to anticipate transfer to skilled nursing today

## 2015-03-22 NOTE — Progress Notes (Signed)
Dr. [name] called back and gave order for suppository. Patient had BM x2, [facility] notified and EMS called. Wilnette Kales

## 2015-03-22 NOTE — Care Management (Signed)
Has accepted a bed at [facility]

## 2015-03-23 ENCOUNTER — Encounter: Payer: Self-pay | Admitting: Dietician

## 2015-03-23 LAB — GLUCOSE, CAPILLARY
GLUCOSE-CAPILLARY: 148 mg/dL — AB (ref 65–99)
GLUCOSE-CAPILLARY: 168 mg/dL — AB (ref 65–99)
GLUCOSE-CAPILLARY: 127 mg/dL — AB (ref 65–99)
Glucose-Capillary: 172 mg/dL — ABNORMAL HIGH (ref 65–99)

## 2015-03-25 ENCOUNTER — Encounter
Admission: RE | Admit: 2015-03-25 | Discharge: 2015-03-25 | Disposition: A | Discharge status: Home / Self Care | Payer: Medicare Other | Source: Ambulatory Visit | Attending: Internal Medicine | Admitting: Internal Medicine

## 2015-03-25 LAB — GLUCOSE, CAPILLARY
GLUCOSE-CAPILLARY: 107 mg/dL — AB (ref 65–99)
Glucose-Capillary: 146 mg/dL — ABNORMAL HIGH (ref 65–99)
Glucose-Capillary: 98 mg/dL (ref 65–99)
Glucose-Capillary: 96 mg/dL (ref 65–99)

## 2015-03-27 LAB — GLUCOSE, CAPILLARY
Glucose-Capillary: 91 mg/dL (ref 65–99)
Glucose-Capillary: 111 mg/dL — ABNORMAL HIGH (ref 65–99)
Glucose-Capillary: 104 mg/dL — ABNORMAL HIGH (ref 65–99)
Glucose-Capillary: 156 mg/dL — ABNORMAL HIGH (ref 65–99)

## 2015-03-29 LAB — GLUCOSE, CAPILLARY
GLUCOSE-CAPILLARY: 122 mg/dL — AB (ref 65–99)
GLUCOSE-CAPILLARY: 116 mg/dL — AB (ref 65–99)
Glucose-Capillary: 125 mg/dL — ABNORMAL HIGH (ref 65–99)
Glucose-Capillary: 97 mg/dL (ref 65–99)
Glucose-Capillary: 104 mg/dL — ABNORMAL HIGH (ref 65–99)

## 2015-03-30 ENCOUNTER — Ambulatory Visit (INDEPENDENT_AMBULATORY_CARE_PROVIDER_SITE_OTHER): Payer: Medicare Other | Admitting: Podiatry

## 2015-03-30 ENCOUNTER — Encounter: Payer: Self-pay | Admitting: Podiatry

## 2015-03-30 ENCOUNTER — Ambulatory Visit: Payer: Medicare Other

## 2015-03-30 DIAGNOSIS — Z794 Long term (current) use of insulin: Secondary | ICD-10-CM

## 2015-03-30 DIAGNOSIS — M79676 Pain in unspecified toe(s): Secondary | ICD-10-CM | POA: Diagnosis not present

## 2015-03-30 DIAGNOSIS — E1140 Type 2 diabetes mellitus with diabetic neuropathy, unspecified: Secondary | ICD-10-CM

## 2015-03-30 DIAGNOSIS — B351 Tinea unguium: Secondary | ICD-10-CM

## 2015-03-30 DIAGNOSIS — Z950 Presence of cardiac pacemaker: Secondary | ICD-10-CM | POA: Insufficient documentation

## 2015-03-30 LAB — GLUCOSE, CAPILLARY: Glucose-Capillary: 89 mg/dL (ref 65–99)

## 2015-03-30 NOTE — Progress Notes (Signed)
Subjective: 81 y.o. returns the office today for painful, elongated, thickened toenails which she is unable to trim herself. Denies any redness or drainage around the nails. Denies any acute changes since last appointment and no new complaints today. Denies any systemic complaints such as fevers, chills, nausea, vomiting.   Objective: AAO 3, NAD DP/PT pulses palpable, CRT less than 3 seconds  Nails hypertrophic, dystrophic, elongated, brittle, discolored 10. There is tenderness overlying the nails 1-5 bilaterally. There is no surrounding erythema or drainage along the nail sites. No open lesions or pre-ulcerative lesions are identified. No other areas of tenderness bilateral lower extremities. No overlying edema, erythema, increased warmth. No pain with calf compression, swelling, warmth, erythema.  Assessment: Patient presents with symptomatic onychomycosis  Plan: -Treatment options including alternatives, risks, complications were discussed -Nails sharply debrided 10 without complication/bleeding. -Discussed daily foot inspection. If there are any changes, to call the office immediately.  -Follow-up in 3 months or sooner if any problems are to arise. In the meantime, encouraged to call the office with any questions, concerns, changes symptoms.   Parish Dubose, DPM  

## 2015-03-31 LAB — GLUCOSE, CAPILLARY: Glucose-Capillary: 87 mg/dL (ref 65–99)

## 2015-04-01 LAB — GLUCOSE, CAPILLARY
GLUCOSE-CAPILLARY: 191 mg/dL — AB (ref 65–99)
Glucose-Capillary: 107 mg/dL — ABNORMAL HIGH (ref 65–99)
Glucose-Capillary: 192 mg/dL — ABNORMAL HIGH (ref 65–99)
Glucose-Capillary: 104 mg/dL — ABNORMAL HIGH (ref 65–99)

## 2015-04-02 LAB — GLUCOSE, CAPILLARY: GLUCOSE-CAPILLARY: 104 mg/dL — AB (ref 65–99)

## 2015-04-03 LAB — URINALYSIS COMPLETE WITH MICROSCOPIC (ARMC ONLY)
Bilirubin Urine: NEGATIVE
GLUCOSE, UA: NEGATIVE mg/dL
HGB URINE DIPSTICK: NEGATIVE
Ketones, ur: NEGATIVE mg/dL
Nitrite: NEGATIVE
Protein, ur: NEGATIVE mg/dL
RBC / HPF: NONE SEEN RBC/hpf (ref 0–5)
Specific Gravity, Urine: 1.009 (ref 1.005–1.030)
pH: 5 (ref 5.0–8.0)

## 2015-04-03 LAB — GLUCOSE, CAPILLARY: GLUCOSE-CAPILLARY: 120 mg/dL — AB (ref 65–99)

## 2015-04-04 LAB — GLUCOSE, CAPILLARY
GLUCOSE-CAPILLARY: 146 mg/dL — AB (ref 65–99)
GLUCOSE-CAPILLARY: 192 mg/dL — AB (ref 65–99)
GLUCOSE-CAPILLARY: 100 mg/dL — AB (ref 65–99)

## 2015-04-05 LAB — URINE CULTURE: Culture: >=100000

## 2015-04-06 LAB — GLUCOSE, CAPILLARY
Glucose-Capillary: 149 mg/dL — ABNORMAL HIGH (ref 65–99)
Glucose-Capillary: 104 mg/dL — ABNORMAL HIGH (ref 65–99)
Glucose-Capillary: 131 mg/dL — ABNORMAL HIGH (ref 65–99)

## 2015-04-07 LAB — GLUCOSE, CAPILLARY
GLUCOSE-CAPILLARY: 142 mg/dL — AB (ref 65–99)
GLUCOSE-CAPILLARY: 55 mg/dL — AB (ref 65–99)
GLUCOSE-CAPILLARY: 79 mg/dL (ref 65–99)
Glucose-Capillary: 111 mg/dL — ABNORMAL HIGH (ref 65–99)

## 2015-04-08 LAB — GLUCOSE, CAPILLARY: Glucose-Capillary: 133 mg/dL — ABNORMAL HIGH (ref 65–99)

## 2015-04-09 LAB — GLUCOSE, CAPILLARY
Glucose-Capillary: 130 mg/dL — ABNORMAL HIGH (ref 65–99)
Glucose-Capillary: 119 mg/dL — ABNORMAL HIGH (ref 65–99)

## 2015-04-10 LAB — GLUCOSE, CAPILLARY
GLUCOSE-CAPILLARY: 115 mg/dL — AB (ref 65–99)
GLUCOSE-CAPILLARY: 166 mg/dL — AB (ref 65–99)
Glucose-Capillary: 109 mg/dL — ABNORMAL HIGH (ref 65–99)

## 2015-04-11 LAB — GLUCOSE, CAPILLARY: GLUCOSE-CAPILLARY: 137 mg/dL — AB (ref 65–99)

## 2015-06-01 ENCOUNTER — Encounter: Payer: Self-pay | Admitting: Podiatry

## 2015-06-01 ENCOUNTER — Ambulatory Visit (INDEPENDENT_AMBULATORY_CARE_PROVIDER_SITE_OTHER): Payer: Medicare Other | Admitting: Podiatry

## 2015-06-01 DIAGNOSIS — Z794 Long term (current) use of insulin: Secondary | ICD-10-CM

## 2015-06-01 DIAGNOSIS — Q828 Other specified congenital malformations of skin: Secondary | ICD-10-CM

## 2015-06-01 DIAGNOSIS — M79676 Pain in unspecified toe(s): Secondary | ICD-10-CM | POA: Diagnosis not present

## 2015-06-01 DIAGNOSIS — E1140 Type 2 diabetes mellitus with diabetic neuropathy, unspecified: Secondary | ICD-10-CM | POA: Diagnosis not present

## 2015-06-01 DIAGNOSIS — B351 Tinea unguium: Secondary | ICD-10-CM | POA: Diagnosis not present

## 2015-06-01 NOTE — Progress Notes (Signed)
Subjective: 81 y.o. returns the office today for painful, elongated, thickened toenails which she is unable to trim herself. Denies any redness or drainage around the nails. Also has calluses to both of her heels. Denies any redness or drainage. Denies any acute changes since last appointment and no new complaints today. Denies any systemic complaints such as fevers, chills, nausea, vomiting.   Objective: AAO 3, NAD; presents in wheelchair DP/PT pulses palpable, CRT less than 3 seconds  Nails hypertrophic, dystrophic, elongated, brittle, discolored 10. There is tenderness overlying the nails 1-5 bilaterally. There is no surrounding erythema or drainage along the nail sites. Hyperkerotic lesion along the medial aspect of the heel bilaterally and lateral 5th toe. Upon debridement, no underlying ulceration, drainage, or other signs of infection.  No open lesions or pre-ulcerative lesions are identified. Hammertoe deformities.  No other areas of tenderness bilateral lower extremities. No overlying edema, erythema, increased warmth. No pain with calf compression, swelling, warmth, erythema.  Assessment: Patient presents with symptomatic onychomycosis  Plan: -Treatment options including alternatives, risks, complications were discussed -Nails sharply debrided 10 without complication/bleeding. -Hyperkerotic lesions debrided x 3 without complications/bleeding.  -Discussed daily foot inspection. If there are any changes, to call the office immediately.  -Follow-up in 3 months or sooner if any problems are to arise. In the meantime, encouraged to call the office with any questions, concerns, changes symptoms.  Celesta Gentile, DPM

## 2015-07-27 ENCOUNTER — Ambulatory Visit (INDEPENDENT_AMBULATORY_CARE_PROVIDER_SITE_OTHER): Payer: Medicare Other | Admitting: Internal Medicine

## 2015-07-27 ENCOUNTER — Encounter: Payer: Self-pay | Admitting: Internal Medicine

## 2015-07-27 VITALS — BP 134/74 | HR 77 | Ht 60.0 in | Wt 277.0 lb

## 2015-07-27 DIAGNOSIS — J479 Bronchiectasis, uncomplicated: Secondary | ICD-10-CM

## 2015-07-27 NOTE — Progress Notes (Signed)
  Subjective:    Patient ID: Teresa Krueger, female    DOB: 1934-04-14, 82 y.o.   MRN: [id number]  Synopsis: vaudie biagioni first saw the [facility] pulmonary clinic in July 2013 for cough. She has a one pack per day smoking history x30 years and quit in the 1990s. She had simple spirometry in July 2013 which showed no evidence of COPD. She has a history of childhood pertussis  She has postnasal drip and gastroesophageal reflux disease with Barrett's esophagus.   CC: follow up for Bronchiectasis HPI  Only complaint is Hoarseness, no cough Doing well with advair She had a CT angiogram of her chest feb 2016 recently which was negative for PE but showed some small 48mm nodules.  I also saw bronchiectasis.-repeat CT chest next week Uses flutter valve once daily, uses albuterol infrequently No signs of infection at this time SOB/DOE same as before no change She is wheelchair bound S/p pacemaker placement   Review of Systems  Constitutional: Negative for fever, chills, diaphoresis and fatigue.  HENT: Negative for congestion, postnasal drip and rhinorrhea.   Respiratory: Negative for cough, chest tightness, shortness of breath, wheezing and stridor.   Cardiovascular: Positive for leg swelling. Negative for chest pain.  All other systems reviewed and are negative.     Objective:   Physical Exam  Constitutional: She appears well-developed and well-nourished. No distress.  HENT:  Head: Normocephalic and atraumatic.  Eyes: Pupils are equal, round, and reactive to light.  Neck: Normal range of motion. Neck supple.  Cardiovascular: Normal rate and regular rhythm.   No murmur heard. Pulmonary/Chest: Effort normal and breath sounds normal. No respiratory distress. She has no wheezes. She has no rales.  Musculoskeletal: She exhibits edema.  Skin: She is not diaphoretic.   Filed Vitals:   07/27/15 1030  BP: 134/74  Pulse: 77  Height: 5' (1.524 m)  Weight: 277 lb (125.646 kg)   SpO2: 96%  RA   04/2012 CT chest >> no emphysema, there is some bronchiectasis in the RML and Lingula (McQuaid read) 04/2012 CT sinuses >> no significant signs of sinusitis 04/2012 CT chest [facility] >> no emphysema or nodules, mild RML and lingula bronchiectasis  04/2012 AFB culture x3 negative February 2016 CT angiogram chest> mild lingular bronchiectasis, scattered pulmonary nodules bilaterally, largest is 6 mm, no pulmonary embolism Feb CTchest pending   Assessment & Plan:  82 yo obese white female with Bronchiectasis that seems to be stable at this time  1.Bronchiectasis -mild bronchiectasis. She has not had an exacerbation recently. She has been well controlled on Advair. -advised to use flutter valve more frequently -check over night pulse oximtery  2.Multiple pulmonary nodules She was noted to have multiple pulmonary nodules on her  CT chest. None of these are larger than 6 mm 1 year ago. -She has a heavy smoking history but has been greater than 30 years and she quit smoking. Repeat CT scan pending -will review CT chest and re-assess plan of action    The Patient requires high complexity decision making for assessment and support, frequent evaluation and titration of therapies. Patient/Family are satisfied with Plan of action and management. All questions answered  Corrin Parker, M.D.  [facility]  Medical Director [place] Director [facility] Cardio-Pulmonary Department

## 2015-07-27 NOTE — Patient Instructions (Signed)
Bronchiectasis Bronchiectasis is a condition in which the airways (bronchi) are damaged and widened. This makes it difficult for the lungs to get rid of mucus. As a result, mucus gathers in the airways, and this often leads to lung infections. Infection can cause inflammation in the airways, which may further weaken and damage the bronchi.  CAUSES  Bronchiectasis may be present at birth (congenital) or may develop later in life. Sometimes there is no apparent cause. Some common causes include:  Cystic fibrosis.   Recurrent lung infections (such as pneumonia, tuberculosis, or fungal infections).  Foreign bodies or other blockages in the lungs.  Breathing in fluid, food, or other foreign objects (aspiration). SIGNS AND SYMPTOMS  Common symptoms include:  A daily cough that brings up mucus and lasts for more than 3 weeks.  Frequent lung infections (such as pneumonia, tuberculosis, or fungal infections).  Shortness of breath and wheezing.   Weakness and fatigue. DIAGNOSIS  Various tests may be done to help diagnose bronchiectasis. Tests may include:  Chest X-rays or CT scans.   Breathing tests to help determine how your lungs are working.   Sputum cultures to check for infection.   Blood tests and other tests to check for related diseases or causes, such as cystic fibrosis. TREATMENT  Treatment varies depending on the severity of the condition. Medicines may be given to loosen the mucus to be coughed up (expectorants), to relax the muscles of the air passages (bronchodilators), or to prevent or treat infections (antibiotics). Physical therapy methods may be recommended to help clear mucus from the lungs. For severe cases, surgery may be done to remove the affected part of the lung. HOME CARE INSTRUCTIONS   Get plenty of rest.   Only take over-the-counter or prescription medicines as directed by your health care provider. If antibiotic medicines were prescribed, take them as  directed. Finish them even if you start to feel better.  Avoid sedatives and antihistamines unless otherwise directed by your health care provider. These medicines tend to thicken the mucus in the lungs.   Perform any breathing exercises or techniques to clear the lungs as directed by your health care provider.  Drink enough fluids to keep your urine clear or pale yellow.  Consider using a cold steam vaporizer or humidifier in your room or home to help loosen secretions.   If the cough is worse at night, try sleeping in a semi-upright position in a recliner or using a couple of pillows.   Avoid cigarette smoke and lung irritants. If you smoke, quit.  Stay inside when pollution and ozone levels are high.   Stay current with vaccinations and immunizations.   Follow up with your health care provider as directed.  SEEK MEDICAL CARE IF:  You cough up more thick, discolored mucus (sputum) that is yellow to green in color.  You have a fever or persistent symptoms for more than 2-3 days.  You cannot control your cough and are losing sleep. SEEK IMMEDIATE MEDICAL CARE IF:   You cough up blood.   You have chest pain or increasing shortness of breath.   You have pain that is getting worse or is uncontrolled with medicines.   You have a fever and your symptoms suddenly get worse. MAKE SURE YOU:  Understand these instructions.   Will watch your condition.   Will get help right away if you are not doing well or get worse.    This information is not intended to replace advice given to   you by your health care provider. Make sure you discuss any questions you have with your health care provider.   Document Released: 04/07/2007 Document Revised: 06/15/2013 Document Reviewed: 12/16/2012 Elsevier Interactive Patient Education 2016 [employer]  

## 2015-07-31 ENCOUNTER — Ambulatory Visit: Payer: Medicare Other

## 2015-08-01 ENCOUNTER — Encounter: Payer: Self-pay | Admitting: Internal Medicine

## 2015-08-03 ENCOUNTER — Ambulatory Visit
Admission: RE | Admit: 2015-08-03 | Discharge: 2015-08-03 | Disposition: A | Discharge status: Home / Self Care | Payer: Medicare Other | Source: Ambulatory Visit | Attending: Internal Medicine | Admitting: Internal Medicine

## 2015-08-03 DIAGNOSIS — I2510 Atherosclerotic heart disease of native coronary artery without angina pectoris: Secondary | ICD-10-CM | POA: Diagnosis not present

## 2015-08-03 DIAGNOSIS — R918 Other nonspecific abnormal finding of lung field: Secondary | ICD-10-CM | POA: Insufficient documentation

## 2015-08-03 DIAGNOSIS — I517 Cardiomegaly: Secondary | ICD-10-CM | POA: Insufficient documentation

## 2015-08-03 DIAGNOSIS — Z853 Personal history of malignant neoplasm of breast: Secondary | ICD-10-CM | POA: Diagnosis not present

## 2015-08-03 DIAGNOSIS — R0602 Shortness of breath: Secondary | ICD-10-CM | POA: Insufficient documentation

## 2015-08-07 ENCOUNTER — Telehealth: Payer: Self-pay | Admitting: *Deleted

## 2015-08-07 ENCOUNTER — Other Ambulatory Visit: Payer: Self-pay | Admitting: Internal Medicine

## 2015-08-07 DIAGNOSIS — R911 Solitary pulmonary nodule: Secondary | ICD-10-CM

## 2015-08-07 DIAGNOSIS — J479 Bronchiectasis, uncomplicated: Secondary | ICD-10-CM

## 2015-08-07 NOTE — Telephone Encounter (Signed)
Pt informed of her need of O2 at night of 2L. Order placed.

## 2015-08-16 ENCOUNTER — Telehealth: Payer: Self-pay | Admitting: Internal Medicine

## 2015-08-16 DIAGNOSIS — J479 Bronchiectasis, uncomplicated: Secondary | ICD-10-CM

## 2015-08-16 NOTE — Telephone Encounter (Signed)
Pt is calling not sure if she is allergic or not  But the oxygen that we put her on that her nose is really itchy and dry  Its gotten to the point where she is having bleeding scabs.  "driving her crazy" worst than having cold Would like to know what can she do  Please advise.   Does state she is allergic to LOTS of medication not sure if this is one of those things she is allergic to something in it

## 2015-08-16 NOTE — Telephone Encounter (Signed)
Spoke with pt and informed her she can rub some Neosporin in her nares to help and that I would place an order to get her a humidifier for her O2. Nothing further needed.

## 2015-08-18 ENCOUNTER — Other Ambulatory Visit: Payer: Self-pay | Admitting: *Deleted

## 2015-08-18 ENCOUNTER — Telehealth: Payer: Self-pay | Admitting: Internal Medicine

## 2015-08-18 DIAGNOSIS — J479 Bronchiectasis, uncomplicated: Secondary | ICD-10-CM

## 2015-08-18 NOTE — Telephone Encounter (Signed)
Pt informed [facility] was waiting for KK to sign the order before they would take out the humidifier. Informed pt another provider signed the order and they should contact her today. Suanne Marker states she has contacted [facility] in regards to the order. Nothing further needed.

## 2015-08-18 NOTE — Telephone Encounter (Signed)
Pt calling again stating she has not received any news from [facility]  And is in a lot of pain would like someone to call them and see what is taking so long Please advise

## 2015-08-31 ENCOUNTER — Encounter: Payer: Self-pay | Admitting: Podiatry

## 2015-08-31 ENCOUNTER — Ambulatory Visit (INDEPENDENT_AMBULATORY_CARE_PROVIDER_SITE_OTHER): Payer: Medicare Other | Admitting: Podiatry

## 2015-08-31 ENCOUNTER — Ambulatory Visit: Payer: Medicare Other | Admitting: Podiatry

## 2015-08-31 DIAGNOSIS — Z794 Long term (current) use of insulin: Secondary | ICD-10-CM

## 2015-08-31 DIAGNOSIS — E1140 Type 2 diabetes mellitus with diabetic neuropathy, unspecified: Secondary | ICD-10-CM | POA: Diagnosis not present

## 2015-08-31 DIAGNOSIS — M79676 Pain in unspecified toe(s): Secondary | ICD-10-CM | POA: Diagnosis not present

## 2015-08-31 DIAGNOSIS — Q828 Other specified congenital malformations of skin: Secondary | ICD-10-CM | POA: Diagnosis not present

## 2015-08-31 DIAGNOSIS — B351 Tinea unguium: Secondary | ICD-10-CM | POA: Diagnosis not present

## 2015-08-31 NOTE — Progress Notes (Signed)
Patient ID: Teresa Krueger, female   DOB: 08/11/1933, 82 y.o.   MRN: [id number]  Subjective: 82 y.o. returns the office today for painful, elongated, thickened toenails which she is unable to trim herself. Denies any redness or drainage around the nails. Also she continues to have reoccurance  has calluses to both of her heels. Denies any redness or drainage. Denies any acute changes since last appointment and no new complaints today. Denies any systemic complaints such as fevers, chills, nausea, vomiting.   Objective: AAO 3, NAD; presents in wheelchair DP/PT pulses palpable, CRT less than 3 seconds  Nails hypertrophic, dystrophic, elongated, brittle, discolored 10. There is tenderness overlying the nails 1-5 bilaterally. There is no surrounding erythema or drainage along the nail sites. Hyperkerotic lesion along the medial aspect of the heel bilaterally and lateral 5th toe. Upon debridement, no underlying ulceration, drainage, or other signs of infection.  No open lesions or pre-ulcerative lesions are identified. Hammertoe deformities.  No other areas of tenderness bilateral lower extremities. No overlying edema, erythema, increased warmth. No pain with calf compression, swelling, warmth, erythema.  Assessment: Patient presents with symptomatic onychomycosis  Plan: -Treatment options including alternatives, risks, complications were discussed -Nails sharply debrided 10 without complication/bleeding. -Hyperkerotic lesions debrided x 3 without complications/bleeding.  -Discussed daily foot inspection. If there are any changes, to call the office immediately.  -Follow-up in 3 months or sooner if any problems are to arise. In the meantime, encouraged to call the office with any questions, concerns, changes symptoms.  Celesta Gentile, DPM

## 2015-09-11 ENCOUNTER — Other Ambulatory Visit: Payer: Self-pay | Admitting: Internal Medicine

## 2015-09-11 DIAGNOSIS — N63 Unspecified lump in unspecified breast: Secondary | ICD-10-CM

## 2015-09-21 ENCOUNTER — Encounter: Payer: Self-pay | Admitting: Dietician

## 2015-09-21 ENCOUNTER — Encounter: Payer: Medicare Other | Attending: Internal Medicine | Admitting: Dietician

## 2015-09-21 DIAGNOSIS — E119 Type 2 diabetes mellitus without complications: Secondary | ICD-10-CM | POA: Diagnosis not present

## 2015-09-21 DIAGNOSIS — Z794 Long term (current) use of insulin: Secondary | ICD-10-CM

## 2015-09-21 NOTE — Progress Notes (Signed)
Medical Nutrition Therapy: Visit start time: [id number]  end time: 1630 Assessment:  Diagnosis: Type 2 diabetes Past medical history: HTN, nephropathy, COPD, GERD, Barrett's esophagus, dyspepsia Psychosocial issues/ stress concerns: Patient rates her stress as "high" and indicates "not so well" as to how she is dealing with her stress. Offered a visit with the [facility] counselor and at first she said "yes" but changed her mind by the end of the visit. Encouraged her to call me if she decides she wants to schedule. Preferred learning method:  . Auditory . Hands-on Current weight: no vitals today; unable to weight. She states that her weight yesterday was 275 lbs. Medications, supplements: see list Progress and evaluation:  Patient in for initial medical nutrition therapy visit. She reports that her most recent HgbA1c was 6.2. She also reports she received a pace maker 11/2014 and was so sick during that time that she lost 25 lbs and has maintained that loss. She expressed that her main concern and reason for the visit is abdominal pain, gas and bloating she is experiencing and reports also a change in her bowl movements. She reports fecal urgency and reports she often can not make it to the bathroom before soiling her clothes. She reports that medication has helped. She states she is interested in diet changes that might help decrease GI issues. She indicated on her questionnaire that she would like a diet for IBS but states she has not been told that this is her diagnosis. She and her husband dine out frequently and she acknowledges that fecal urgency often occurs after meals "out" although has also occurred at home. She gave examples of meals such as fried flounder, hush puppies or barbeque and hush puppies or cheeseburger and a few fries. Her present diet is high in fat and sodium although based on information given, lower fat and sodium meals are prepared at home.   Physical activity: none; patient is in a  wheelchair  Dietary Intake:  Usual eating pattern includes 3 meals and 1-2 snacks per day.  Breakfast: 10:30-11:00am 2 pieces bacon or sausage, 1 egg or egg/cheese omelette, white toast, cantaloupe Lunch: 2-3:00pm- 1/2 [place] sandwich with small amount of mayonnaise, red grapes, water Supper: 7-8:00pm- roast pork, potato salad, green beans, cantaloupe or if "out"- fried flounder or barbeque, hush puppies Snack: ice cream, 2 chocolate chip cookies Beverages: water 5-6 cups/day; diet [place] Education: Diabetes:  Reviewed general dietary guidelines for diabetes using food guide plate including balance of carbohydrate, lean protein, non-starchy vegetables and small amounts of added fat if not already in the foods. IBS: Addressed patient's concerns and questions about diet and IBS since this was her primary focus. Gave and reviewed a list of possible trigger foods but discussed how basic diet changes such as lowering the fat content would be a good place to start. Discussed how keeping a food record and record of bowel movements and consistency could possibly help identify trigger foods.  Nutritional Diagnosis:  NI-5.6.2 Excessive fat intake As related to intake of fried foods and high fat choices when dining "out".  As evidenced by diet history..  Intervention:  Balance meals with 1-3 oz. Lean protein, 1 starch serving and non-starchy vegetables. Avoid high fat foods: sausage, fried foods, hamburgers, french fries, hush puppies, fried fish Limit raw vegetables. Limit cabbage, broccoli and cauliflower. Avoid carbonated beverages. Keep food records. Also record bowel movements and consistency. Take note if have symptoms after including milk or cheese.  Education Materials given:  . General diet guidelines for Diabetes . Food records . Goals/ instructions . List of foods that might trigger IBS  Learner/ who was taught:  . Patient  Level of understanding: . Partial  understanding; needs review/ practice Learning barriers: . None Willingness to learn/ readiness for change: . Acceptance, ready for change  Monitoring and Evaluation:  Dietary intake, exercise,and body weight      follow up:10/10/15 at 1:30pm

## 2015-09-21 NOTE — Patient Instructions (Signed)
Balance meals with 1-3 oz. Lean protein, 1 starch serving and non-starchy vegetables. Avoid high fat foods: sausage, fried foods, hamburgers, french fries, hush puppies, fried fish Limit raw vegetables. Limit cabbage, broccoli and cauliflower. Avoid carbonated beverages. Keep food records. Also record bowel movements and consistency. Take note if have symptoms after including milk or cheese.   Note:" I can't believe it's not butter" lite

## 2015-09-26 ENCOUNTER — Ambulatory Visit
Admission: RE | Admit: 2015-09-26 | Discharge: 2015-09-26 | Disposition: A | Discharge status: Home / Self Care | Payer: Medicare Other | Source: Ambulatory Visit | Attending: Internal Medicine | Admitting: Internal Medicine

## 2015-09-26 DIAGNOSIS — Z9012 Acquired absence of left breast and nipple: Secondary | ICD-10-CM | POA: Insufficient documentation

## 2015-09-26 DIAGNOSIS — D171 Benign lipomatous neoplasm of skin and subcutaneous tissue of trunk: Secondary | ICD-10-CM | POA: Diagnosis not present

## 2015-09-26 DIAGNOSIS — N63 Unspecified lump in unspecified breast: Secondary | ICD-10-CM

## 2015-09-26 HISTORY — DX: Malignant neoplasm of unspecified site of unspecified female breast: C50.919

## 2015-10-10 ENCOUNTER — Ambulatory Visit: Payer: Medicare Other | Admitting: Dietician

## 2015-10-20 ENCOUNTER — Encounter: Payer: Self-pay | Admitting: Internal Medicine

## 2015-10-20 ENCOUNTER — Ambulatory Visit (INDEPENDENT_AMBULATORY_CARE_PROVIDER_SITE_OTHER): Payer: Medicare Other | Admitting: Internal Medicine

## 2015-10-20 VITALS — BP 142/82 | HR 84 | Ht 60.0 in | Wt 278.0 lb

## 2015-10-20 DIAGNOSIS — J479 Bronchiectasis, uncomplicated: Secondary | ICD-10-CM

## 2015-10-20 NOTE — Patient Instructions (Signed)
Bronchiectasis Bronchiectasis is a condition in which the airways (bronchi) are damaged and widened. This makes it difficult for the lungs to get rid of mucus. As a result, mucus gathers in the airways, and this often leads to lung infections. Infection can cause inflammation in the airways, which may further weaken and damage the bronchi.  CAUSES  Bronchiectasis may be present at birth (congenital) or may develop later in life. Sometimes there is no apparent cause. Some common causes include:  Cystic fibrosis.   Recurrent lung infections (such as pneumonia, tuberculosis, or fungal infections).  Foreign bodies or other blockages in the lungs.  Breathing in fluid, food, or other foreign objects (aspiration). SIGNS AND SYMPTOMS  Common symptoms include:  A daily cough that brings up mucus and lasts for more than 3 weeks.  Frequent lung infections (such as pneumonia, tuberculosis, or fungal infections).  Shortness of breath and wheezing.   Weakness and fatigue. DIAGNOSIS  Various tests may be done to help diagnose bronchiectasis. Tests may include:  Chest X-rays or CT scans.   Breathing tests to help determine how your lungs are working.   Sputum cultures to check for infection.   Blood tests and other tests to check for related diseases or causes, such as cystic fibrosis. TREATMENT  Treatment varies depending on the severity of the condition. Medicines may be given to loosen the mucus to be coughed up (expectorants), to relax the muscles of the air passages (bronchodilators), or to prevent or treat infections (antibiotics). Physical therapy methods may be recommended to help clear mucus from the lungs. For severe cases, surgery may be done to remove the affected part of the lung. HOME CARE INSTRUCTIONS   Get plenty of rest.   Only take over-the-counter or prescription medicines as directed by your health care provider. If antibiotic medicines were prescribed, take them as  directed. Finish them even if you start to feel better.  Avoid sedatives and antihistamines unless otherwise directed by your health care provider. These medicines tend to thicken the mucus in the lungs.   Perform any breathing exercises or techniques to clear the lungs as directed by your health care provider.  Drink enough fluids to keep your urine clear or pale yellow.  Consider using a cold steam vaporizer or humidifier in your room or home to help loosen secretions.   If the cough is worse at night, try sleeping in a semi-upright position in a recliner or using a couple of pillows.   Avoid cigarette smoke and lung irritants. If you smoke, quit.  Stay inside when pollution and ozone levels are high.   Stay current with vaccinations and immunizations.   Follow up with your health care provider as directed.  SEEK MEDICAL CARE IF:  You cough up more thick, discolored mucus (sputum) that is yellow to green in color.  You have a fever or persistent symptoms for more than 2-3 days.  You cannot control your cough and are losing sleep. SEEK IMMEDIATE MEDICAL CARE IF:   You cough up blood.   You have chest pain or increasing shortness of breath.   You have pain that is getting worse or is uncontrolled with medicines.   You have a fever and your symptoms suddenly get worse. MAKE SURE YOU:  Understand these instructions.   Will watch your condition.   Will get help right away if you are not doing well or get worse.    This information is not intended to replace advice given to   you by your health care provider. Make sure you discuss any questions you have with your health care provider.   Document Released: 04/07/2007 Document Revised: 06/15/2013 Document Reviewed: 12/16/2012 Elsevier Interactive Patient Education 2016 [employer]  

## 2015-10-20 NOTE — Progress Notes (Signed)
  Subjective:    Patient ID: Teresa Krueger, female    DOB: [birth year], 82 y.o.   MRN: [id number]  Synopsis: Teresa Krueger first saw the [facility] pulmonary clinic in July 2013 for cough. She has a one pack per day smoking history x30 years and quit in the 1990s. She had simple spirometry in July 2013 which showed no evidence of COPD. She has a history of childhood pertussis  She has postnasal drip and gastroesophageal reflux disease with Barrett's esophagus.   CC: follow up for Bronchiectasis  HPI  No complaints, doing really well Doing well with advair She had a CT angiogram of her chest feb 2016 recently which was negative for PE but showed some small 2mm nodules.  I also saw bronchiectasis.- repeat CT 07/2015 shows stable nodules-will get repat Ct chest in 01/2016 Uses flutter valve once daily, uses albuterol infrequently No signs of infection at this time SOB/DOE same as before no change She is wheelchair bound S/p pacemaker placement   Review of Systems  Constitutional: Negative for fever, chills, diaphoresis and fatigue.  HENT: Negative for congestion, postnasal drip and rhinorrhea.   Respiratory: Negative for cough, chest tightness, shortness of breath, wheezing and stridor.   Cardiovascular: Positive for leg swelling. Negative for chest pain.  All other systems reviewed and are negative.     Objective:   Physical Exam  Constitutional: She appears well-developed and well-nourished. No distress.  HENT:  Head: Normocephalic and atraumatic.  Eyes: Pupils are equal, round, and reactive to light.  Neck: Normal range of motion. Neck supple.  Cardiovascular: Normal rate and regular rhythm.   No murmur heard. Pulmonary/Chest: Effort normal and breath sounds normal. No respiratory distress. She has no wheezes. She has no rales.  Musculoskeletal: She exhibits edema.  Skin: She is not diaphoretic.   Filed Vitals:   10/20/15 1122  BP: 142/82  Pulse: 84  Height: 5'  (1.524 m)  Weight: 278 lb (126.1 kg)  SpO2: 96%  RA   04/2012 CT chest >> no emphysema, there is some bronchiectasis in the RML and Lingula ([place]aid read) 04/2012 CT sinuses >> no significant signs of sinusitis 04/2012 CT chest [facility] >> no emphysema or nodules, mild RML and lingula bronchiectasis  04/2012 AFB culture x3 negative February 2016 CT angiogram chest> mild lingular bronchiectasis, scattered pulmonary nodules bilaterally, largest is 6 mm, no pulmonary embolism Feb 2017 CTchest stabel nodules, recommend repeat in 6 months.   Assessment & Plan:  82 yo obese white female with Bronchiectasis that seems to be stable at this time  1.Bronchiectasis -mild bronchiectasis. She has not had an exacerbation recently. Will stop advair and assess resp status -advised to use flutter valve more frequently -continue oxygen as needed at night  2.Multiple pulmonary nodules She was noted to have multiple pulmonary nodules on her  CT chest. None of these are larger than 6 mm 1 year ago. -She has a heavy smoking history but has been greater than 30 years and she quit smoking.  -repeat Ct chest in 4 months.  Follow up in 4 months  The Patient requires high complexity decision making for assessment and support, frequent evaluation and titration of therapies. Patient/Family are satisfied with Plan of action and management. All questions answered  Corrin Parker, M.D.  [facility]  Medical Director [place] Director [facility] Cardio-Pulmonary Department

## 2015-10-23 ENCOUNTER — Encounter: Payer: Medicare Other | Attending: Internal Medicine | Admitting: Dietician

## 2015-10-23 ENCOUNTER — Encounter: Payer: Self-pay | Admitting: Dietician

## 2015-10-23 DIAGNOSIS — Z794 Long term (current) use of insulin: Secondary | ICD-10-CM

## 2015-10-23 DIAGNOSIS — E119 Type 2 diabetes mellitus without complications: Secondary | ICD-10-CM

## 2015-10-23 NOTE — Patient Instructions (Signed)
Continue with previous goals. May want to try Beano. Include yogurt daily. Be aware of sugar alcohols and limit. Reintroduce lima beans, black-eyed peas but limit to 1/2 cup. If cooking beans, cook for 10 minutes. Pour off water and add fresh water to cook.

## 2015-10-23 NOTE — Progress Notes (Signed)
Medical Nutrition Therapy Follow-up visit:  Time with patient: 1315-1430 Visit # :2 ASSESSMENT:  Diagnosis:Type 2 diabetes  Current weight:unable to weigh    Medications: See list Medical History: HTN, COPD, GERD, Barrett's esophagus, dyspepsia Progress and evaluation: Patient in for follow-up medical nutrition therapy visit. She has followed through to limit fried foods and other high fat foods, decrease carbonated beverages, to avoid some of the vegetables more likely to cause gas/distention as well as limit raw vegetables. She kept food records which indicate 3 meals per day. She is working to better control portions. She reports 1 bowel movement on most days which she describes as "mushy" most of the time but reports she has had no watery diarrhea as before and has not soiled her clothes due to fecal urgency and not making it to the bathroom in time. She stated, "I am pleased with the results". She acknowledged that she had a bad experience with gas/bloating and pain after eating a meal of deep fat fried flounder, potato and hush puppies. She continues to express concern regarding some general aching in her abdomen area. She also recorded blood sugars and most of her FBG's are 130's or less.   Physical activity:none; patient is in wheelchair  NUTRITION CARE EDUCATION: Diabetes: Commended patient on her portion control with carbohydrate foods and reviewed food records pointing out where she is better balancing carbohydrate, protein and non-starchy vegetables. Reviewed meal plan instructed on at previous visit. Digestive concerns: Reviewed food records commending patient on limiting fried foods and on recognizing the connection between her very high fat meal of fish and hush puppies and abdominal issues. Discussed need for bulk in diet but continued to discourage roughage in the form of raw vegetabels, bran, etc. Discussed soft vegetables and fruits to include. Explained that many no sugar added  sweets such as SF pancake syrup contain sugar alcohols which can cause gas, loose stools or even diarrhea and showed how to identify on the label. Encouraged yogurt as a natural probiotic.  INTERVENTION:  Continue with previous goals. May want to try Beano. Include yogurt daily. Be aware of sugar alcohols and limit. Reintroduce lima beans, black-eyed peas but limit to 1/2 cup. If cooking beans, cook for 10 minutes. Pour off water and add fresh water to cook.  EDUCATION MATERIALS GIVEN:  . Goals/ instructions LEARNER/ who was taught:  . Patient  LEVEL OF UNDERSTANDING: . Partial understanding; needs review/ practice LEARNING BARRIERS: . None  WILLINGNESS TO LEARN/READINESS FOR CHANGE: . Eager, change in progress  MONITORING AND EVALUATION:  Patient requested that I call her in 1 month to schedule a follow-up appointment. She states that she has so many future appointments that she does not want to schedule a follow-up until she has set the other appointments.

## 2015-11-23 ENCOUNTER — Ambulatory Visit (INDEPENDENT_AMBULATORY_CARE_PROVIDER_SITE_OTHER): Payer: Medicare Other | Admitting: Podiatry

## 2015-11-23 ENCOUNTER — Encounter: Payer: Self-pay | Admitting: Podiatry

## 2015-11-23 DIAGNOSIS — Q828 Other specified congenital malformations of skin: Secondary | ICD-10-CM

## 2015-11-23 DIAGNOSIS — E1140 Type 2 diabetes mellitus with diabetic neuropathy, unspecified: Secondary | ICD-10-CM | POA: Diagnosis not present

## 2015-11-23 DIAGNOSIS — B351 Tinea unguium: Secondary | ICD-10-CM | POA: Diagnosis not present

## 2015-11-23 DIAGNOSIS — M79676 Pain in unspecified toe(s): Secondary | ICD-10-CM | POA: Diagnosis not present

## 2015-11-23 DIAGNOSIS — Z794 Long term (current) use of insulin: Principal | ICD-10-CM

## 2015-11-26 NOTE — Progress Notes (Signed)
Patient ID: Teresa Krueger, female   DOB: [birth year], 82 y.o.   MRN: [id number]  Subjective: 82 y.o. returns the office today for painful, elongated, thickened toenails which she is unable to trim herself. Denies any redness or drainage around the nails. Also she has calluses to both of her heels. Denies any redness or drainage that she has noticed. Denies any acute changes since last appointment and no new complaints today. Denies any systemic complaints such as fevers, chills, nausea, vomiting.   Objective: AAO 3, NAD; presents in wheelchair DP/PT pulses palpable, CRT less than 3 seconds  Nails hypertrophic, dystrophic, elongated, brittle, discolored 10. There is tenderness overlying the nails 1-5 bilaterally. There is no surrounding erythema or drainage along the nail sites. Hyperkerotic lesion along the medial aspect of the heel bilaterally.  Upon debridement, no underlying ulceration, drainage, or other signs of infection.  No open lesions or pre-ulcerative lesions are identified. Hammertoe deformities.  No other areas of tenderness bilateral lower extremities. No overlying edema, erythema, increased warmth. No pain with calf compression, swelling, warmth, erythema.  Assessment: Patient presents with symptomatic onychomycosis; hyperkerotic lesions  Plan: -Treatment options including alternatives, risks, complications were discussed -Nails sharply debrided 10 without complication/bleeding. -Hyperkerotic lesions debrided x 2 without complications/bleeding.  -Discussed daily foot inspection. If there are any changes, to call the office immediately.  -Follow-up in 3 months or sooner if any problems are to arise. In the meantime, encouraged to call the office with any questions, concerns, changes symptoms.  Celesta Gentile, DPM

## 2015-12-05 ENCOUNTER — Encounter: Payer: Self-pay | Admitting: Urology

## 2015-12-05 ENCOUNTER — Ambulatory Visit (INDEPENDENT_AMBULATORY_CARE_PROVIDER_SITE_OTHER): Payer: Medicare Other | Admitting: Urology

## 2015-12-05 VITALS — BP 147/65 | HR 78 | Ht 60.0 in | Wt 274.0 lb

## 2015-12-05 DIAGNOSIS — N183 Chronic kidney disease, stage 3 unspecified: Secondary | ICD-10-CM

## 2015-12-05 DIAGNOSIS — R39198 Other difficulties with micturition: Secondary | ICD-10-CM

## 2015-12-05 NOTE — Progress Notes (Signed)
12/05/2015 3:03 PM   Teresa Krueger 09-12-1933 [id number]  Referring provider: Adin Hector, MD [place] [facility] [place], [place] [address]  Chief Complaint  Patient presents with  . Trouble urinating    referred by [name]    HPI: Patient is an 82 year old Caucasian female who is referred to Teresa Krueger by her PCP, Dr. [name], for difficulty with urination.  She states that she retains fluid and is not feeling like she is emptying her bladder. She experiences urgency, nocturia 3-4, a weak urinary stream and intermittency.  She is not experiencing urinary frequency, gross hematuria or suprapubic pain.  She does not have urinary incontinence. She did have dysuria approximately 8 weeks ago with a urinary tract infection.  She states that she does not have a history of recurrent urinary tract infections.  Her PVR today is 41 mL.  She has good fluid intake with 2 cups of coffee and 5 cups of water daily.  She states she does have diarrhea, but she suffers from irritable bowel syndrome.    She does have stage III renal disease and peripheral edema.  She is mostly nonambulatory and uses a motorized scooter.  She has a history of nephrolithiasis and was followed by Dr. Maryan Puls, but she has not seen him in years.  She has not had flank pain or passage of any fragments recently.   Her UA was unremarkable today  PMH: Past Medical History  Diagnosis Date  . Mini stroke ([place])   . Asthma   . Diabetes mellitus ([place])   . Hypertension   . Breast cancer, left ([place])   . Lymphedema   . COPD (chronic obstructive pulmonary disease) ([place])   . Arthritis   . Bronchiectasis ([place])   . Hypercholesterolemia   . Barrett's esophagus   . Osteoporosis   . Breast cancer ([place]) 1999    left breast  . Pacemaker     Surgical History: Past Surgical History  Procedure Laterality Date  . Breast lumpectomy  1999    left  . Total hip arthroplasty  2001    left  .  Gallbladder surgery    . Cataract extraction      bilateral  . Carpal tunnel release      right  . Trigger finger release      bilateral  . Pacemaker insertion Right 03/21/2015    Procedure: INSERTION PACEMAKER;  Surgeon: Isaias Cowman, MD;  Location: [facility] ORS;  Service: Cardiovascular;  Laterality: Right;  . Mastectomy  1999    left    Home Medications:    Medication List       This list is accurate as of: 12/05/15  3:03 PM.  Always use your most recent med list.               acetaminophen 500 MG tablet  Commonly known as:  TYLENOL  Take 500 mg by mouth 3 (three) times daily.     albuterol 108 (90 Base) MCG/ACT inhaler  Commonly known as:  PROVENTIL HFA;VENTOLIN HFA  Inhale 2 puffs into the lungs every 6 (six) hours as needed for wheezing or shortness of breath.     ALPRAZolam 0.5 MG tablet  Commonly known as:  XANAX  Take 1 tablet (0.5 mg total) by mouth 3 (three) times daily as needed for anxiety.     fluticasone 50 MCG/ACT nasal spray  Commonly known as:  FLONASE  Place 2 sprays  into both nostrils daily.     Fluticasone-Salmeterol 250-50 MCG/DOSE Aepb  Commonly known as:  ADVAIR  Inhale 1 puff into the lungs every 12 (twelve) hours.     insulin lispro protamine-lispro (75-25) 100 UNIT/ML Susp injection  Commonly known as:  HUMALOG 75/25 MIX  Inject 15 Units into the skin 2 (two) times daily.     KLOR-CON M20 20 MEQ tablet  Generic drug:  potassium chloride SA  Reported on 09/21/2015     lidocaine 5 %  Commonly known as:  LIDODERM  Place 2 patches onto the skin daily. Remove & Discard patch within 12 hours or as directed by MD     losartan 25 MG tablet  Commonly known as:  COZAAR  Take 25 mg by mouth daily.     mometasone 0.1 % lotion  Commonly known as:  ELOCON  Reported on 12/05/2015     multivitamin with minerals Tabs tablet  Take 1 tablet by mouth daily.     mupirocin ointment 2 %  Commonly known as:  BACTROBAN  Reported on 12/05/2015       omeprazole 20 MG capsule  Commonly known as:  PRILOSEC  Take 1 capsule (20 mg total) by mouth 2 (two) times daily.     simvastatin 20 MG tablet  Commonly known as:  ZOCOR  Take 20 mg by mouth daily.     spironolactone 25 MG tablet  Commonly known as:  ALDACTONE  Take 25 mg by mouth every other day.     sucralfate 1 g tablet  Commonly known as:  CARAFATE  Take 1 g by mouth 4 (four) times daily -  with meals and at bedtime.     torsemide 5 MG tablet  Commonly known as:  DEMADEX  Take 1 tablet (5 mg total) by mouth daily.     traMADol 50 MG tablet  Commonly known as:  ULTRAM  Take by mouth.        Allergies:  Allergies  Allergen Reactions  . Aspirin Swelling and Other (See Comments)    Pt states that she has tongue swelling.    . Celebrex [Celecoxib] Anaphylaxis  . Meloxicam Swelling  . Nsaids Anaphylaxis  . Rofecoxib Anaphylaxis  . Tolmetin Anaphylaxis  . Bactrim [Sulfamethoxazole-Trimethoprim] Hives and Itching  . Biguanide Copolymer Other (See Comments)    Reaction:  Unknown   . Buprenorphine Hcl Other (See Comments)    Reaction:  Unknown   . Ciprofloxacin Other (See Comments)    Reaction:  Unknown   . Codeine Nausea And Vomiting and Other (See Comments)    Reaction:  Syncope   . Delsym [Dextromethorphan Polistirex Er] Other (See Comments)    Reaction:  GI upset   . Dextromethorphan Hbr Other (See Comments)    Reaction:  GI upset   . Furosemide Other (See Comments)    Reaction:  Unknown   . Hydrocodone Other (See Comments)    Reaction:  Unknown   . Metformin Other (See Comments)    Reaction:  Unknown   . Morphine And Related Other (See Comments)    Reaction:  Unknown   . Penicillins Swelling and Other (See Comments)    Pt did not answer the additional questions for this medication because she does not remember.    . Pregabalin Other (See Comments)    Reaction:  Unknown   . Promethazine Other (See Comments)    Reaction:  Unknown   . Propoxyphene Other  (See Comments)  Reaction:  Unknown   . Quinolones Other (See Comments)    Reaction:  Unknown   . Seroquel [Quetiapine Fumarate] Other (See Comments)    Reaction:  Unknown   . Serotonin Reuptake Inhibitors (Ssris) Other (See Comments)    Reaction:  Unknown   . Sulfa Antibiotics Hives and Itching  . Tramadol Nausea Only  . Ultracet [Tramadol-Acetaminophen] Other (See Comments)    Reaction:  Unknown     Family History: Family History  Problem Relation Age of Onset  . Heart disease Father   . Heart disease Mother   . Heart disease Maternal Grandmother   . Heart disease Paternal Grandmother   . Diabetes Paternal Grandmother   . Stroke Maternal Aunt   . Breast cancer Paternal Aunt 94  . Lung cancer Brother 32  . Kidney disease Cousin     Social History:  reports that she quit smoking about 22 years ago. Her smoking use included Cigarettes. She has a 30 pack-year smoking history. She has never used smokeless tobacco. She reports that she does not drink alcohol or use illicit drugs.  ROS: UROLOGY Frequent Urination?: No Hard to postpone urination?: Yes Burning/pain with urination?: No Get up at night to urinate?: Yes Leakage of urine?: No Urine stream starts and stops?: Yes Trouble starting stream?: No Do you have to strain to urinate?: No Blood in urine?: No Urinary tract infection?: Yes Sexually transmitted disease?: No Injury to kidneys or bladder?: No Painful intercourse?: No Weak stream?: Yes Currently pregnant?: No Vaginal bleeding?: No Last menstrual period?: n  Gastrointestinal Nausea?: Yes Vomiting?: No Indigestion/heartburn?: Yes Diarrhea?: Yes Constipation?: No  Constitutional Fever: No Night sweats?: Yes Weight loss?: Yes Fatigue?: Yes  Skin Skin rash/lesions?: Yes Itching?: Yes  Eyes Blurred vision?: Yes Double vision?: No  Ears/Nose/Throat Sore throat?: Yes Sinus problems?: Yes  Hematologic/Lymphatic Swollen glands?: No Easy  bruising?: No  Cardiovascular Leg swelling?: Yes Chest pain?: Yes  Respiratory Cough?: No Shortness of breath?: Yes  Endocrine Excessive thirst?: No  Musculoskeletal Back pain?: Yes Joint pain?: Yes  Neurological Headaches?: No Dizziness?: No  Psychologic Depression?: No Anxiety?: Yes  Physical Exam: BP 147/65 mmHg  Pulse 78  Ht 5' (1.524 m)  Wt 274 lb (124.286 kg)  BMI 53.51 kg/m2  Constitutional: Well nourished. Alert and oriented, No acute distress. HEENT: [place] AT, moist mucus membranes. Trachea midline, no masses. Cardiovascular: No clubbing, cyanosis, or edema. Respiratory: Normal respiratory effort, no increased work of breathing. GI: Abdomen is soft, non tender, non distended, no abdominal masses.  Skin: No rashes, bruises or suspicious lesions. Lymph: No cervical or inguinal adenopathy. Neurologic: Grossly intact, no focal deficits, moving all 4 extremities. Psychiatric: Normal mood and affect.  Laboratory Data: Lab Results  Component Value Date   WBC 6.2 03/22/2015   HGB 12.9 03/22/2015   HCT 38.3 03/22/2015   MCV 87.1 03/22/2015   PLT 158 03/22/2015    Lab Results  Component Value Date   CREATININE 0.86 03/22/2015    Lab Results  Component Value Date   TSH 1.364 03/17/2015     Lab Results  Component Value Date   AST 28 03/20/2015   Lab Results  Component Value Date   ALT 20 03/20/2015     Urinalysis Results for orders placed or performed in visit on 12/05/15  Microscopic Examination  Result Value Ref Range   WBC, UA 6-10 (A) 0 -  5 /hpf   RBC, UA None seen 0 -  2 /hpf  Epithelial Cells (non renal) 0-10 0 - 10 /hpf   Bacteria, UA Few (A) None seen/Few  Urinalysis, Complete  Result Value Ref Range   Specific Gravity, UA <1.005 (L) 1.005 - 1.030   pH, UA 6.0 5.0 - 7.5   Color, UA Yellow Yellow   Appearance Ur Clear Clear   Leukocytes, UA 2+ (A) Negative   Protein, UA Trace (A) Negative/Trace   Glucose, UA Negative  Negative   Ketones, UA Negative Negative   RBC, UA Negative Negative   Bilirubin, UA Negative Negative   Urobilinogen, Ur 0.2 0.2 - 1.0 mg/dL   Nitrite, UA Negative Negative   Microscopic Examination See below:     Pertinent Imaging:   Assessment & Plan:    1. Difficulty with urination:  Due to patient's body habitus, I am unsure if our bladder scan machine is accurate.  I would like her to have a renal ultrasound with postvoid residual for further investigation.  If she is having large residuals/hydronephrosis, this may explain her symptoms.  - Urinalysis, Complete - BLADDER SCAN AMB NON-IMAGING  2. Stage III kidney disease:  Uncertain at this time the contribution of her kidney disease 2 her urinary symptoms.  If she is found to have medical renal disease on the ultrasound and a low residual, it may be that her urine production is diminished. When she returns for her reports and this is the case, I will give her a hat to record her outputs at home.   Return for RUS report and exam.  These notes generated with voice recognition software. I apologize for typographical errors.  Zara Council, [facility] [address] [place], [place] [address] [phone]

## 2015-12-06 ENCOUNTER — Telehealth: Payer: Self-pay | Admitting: Urology

## 2015-12-06 LAB — MICROSCOPIC EXAMINATION: RBC, UA: NONE SEEN /HPF

## 2015-12-06 LAB — URINALYSIS, COMPLETE
BILIRUBIN UA: NEGATIVE
Glucose, UA: NEGATIVE
KETONES UA: NEGATIVE
Nitrite, UA: NEGATIVE
PH UA: 6 (ref 5.0–7.5)
RBC UA: NEGATIVE
UUROB: 0.2 mg/dL (ref 0.2–1.0)

## 2015-12-06 NOTE — Telephone Encounter (Signed)
Pt is in wheelchair and was here yesterday.  Do you think pt needs to see a gynecologist until she is finished with your care, or do you think she needs to see one at all?  [phone]

## 2015-12-08 NOTE — Telephone Encounter (Signed)
I don't thing she needs to see a gynecologist until I see her again.

## 2015-12-11 NOTE — Telephone Encounter (Signed)
Spoke with pt in reference to GYN visit. Made aware Larene [place] said no until she sees pt again. Pt voiced understanding.

## 2015-12-12 ENCOUNTER — Encounter: Payer: Self-pay | Admitting: Anesthesiology

## 2015-12-12 ENCOUNTER — Ambulatory Visit: Payer: Medicare Other | Attending: Anesthesiology | Admitting: Anesthesiology

## 2015-12-12 VITALS — BP 135/40 | HR 68 | Temp 98.1°F | Resp 16 | Ht 60.0 in | Wt 270.0 lb

## 2015-12-12 DIAGNOSIS — J45909 Unspecified asthma, uncomplicated: Secondary | ICD-10-CM | POA: Insufficient documentation

## 2015-12-12 DIAGNOSIS — M4807 Spinal stenosis, lumbosacral region: Secondary | ICD-10-CM | POA: Diagnosis not present

## 2015-12-12 DIAGNOSIS — K2270 Barrett's esophagus without dysplasia: Secondary | ICD-10-CM | POA: Diagnosis not present

## 2015-12-12 DIAGNOSIS — E119 Type 2 diabetes mellitus without complications: Secondary | ICD-10-CM | POA: Diagnosis not present

## 2015-12-12 DIAGNOSIS — Z9012 Acquired absence of left breast and nipple: Secondary | ICD-10-CM | POA: Diagnosis not present

## 2015-12-12 DIAGNOSIS — E7800 Pure hypercholesterolemia, unspecified: Secondary | ICD-10-CM | POA: Diagnosis not present

## 2015-12-12 DIAGNOSIS — Z853 Personal history of malignant neoplasm of breast: Secondary | ICD-10-CM | POA: Insufficient documentation

## 2015-12-12 DIAGNOSIS — Z96649 Presence of unspecified artificial hip joint: Secondary | ICD-10-CM | POA: Insufficient documentation

## 2015-12-12 DIAGNOSIS — Z8673 Personal history of transient ischemic attack (TIA), and cerebral infarction without residual deficits: Secondary | ICD-10-CM | POA: Insufficient documentation

## 2015-12-12 DIAGNOSIS — M5136 Other intervertebral disc degeneration, lumbar region: Secondary | ICD-10-CM | POA: Diagnosis not present

## 2015-12-12 DIAGNOSIS — M1990 Unspecified osteoarthritis, unspecified site: Secondary | ICD-10-CM | POA: Diagnosis not present

## 2015-12-12 DIAGNOSIS — M810 Age-related osteoporosis without current pathological fracture: Secondary | ICD-10-CM | POA: Insufficient documentation

## 2015-12-12 DIAGNOSIS — I10 Essential (primary) hypertension: Secondary | ICD-10-CM | POA: Insufficient documentation

## 2015-12-12 DIAGNOSIS — Z87891 Personal history of nicotine dependence: Secondary | ICD-10-CM | POA: Insufficient documentation

## 2015-12-12 DIAGNOSIS — Z950 Presence of cardiac pacemaker: Secondary | ICD-10-CM | POA: Insufficient documentation

## 2015-12-12 DIAGNOSIS — M150 Primary generalized (osteo)arthritis: Secondary | ICD-10-CM | POA: Diagnosis not present

## 2015-12-12 DIAGNOSIS — M542 Cervicalgia: Secondary | ICD-10-CM | POA: Diagnosis present

## 2015-12-12 DIAGNOSIS — M159 Polyosteoarthritis, unspecified: Secondary | ICD-10-CM

## 2015-12-12 DIAGNOSIS — M549 Dorsalgia, unspecified: Secondary | ICD-10-CM | POA: Diagnosis present

## 2015-12-12 DIAGNOSIS — M545 Low back pain: Secondary | ICD-10-CM | POA: Insufficient documentation

## 2015-12-12 DIAGNOSIS — J449 Chronic obstructive pulmonary disease, unspecified: Secondary | ICD-10-CM | POA: Diagnosis not present

## 2015-12-12 DIAGNOSIS — J479 Bronchiectasis, uncomplicated: Secondary | ICD-10-CM | POA: Insufficient documentation

## 2015-12-12 NOTE — Progress Notes (Signed)
Subjective:  Patient ID: Teresa Krueger, female    DOB: 08/01/1933  Age: 82 y.o. MRN: [id number]  CC: Neck Pain; Back Pain; and Hip Pain      PROCEDURE:None  HPI Teresa Krueger presents for a new patient evaluation. She is referred by Dr. Marry Guan for evaluation of her low back pain and diffuse body pain. She describes a pain it's been present since the age of 9 in her low back. She has had a history of lumbar epidural steroids in the distant past provided by me nearly 15 or 16 years ago. She presents today requesting possible repeat injection for her low back pain. This primarily radiates into her right hip and posterior calf. Tramadol seems to help but makes her sleepy. She describes this pain as an aching agonizing burning constant pain with associated cramping in the calves. She's had associated weakness and pain that wakes her up at night with problems with chronic bowel and bladder issues. She has associated weakness as well and presents today in a wheelchair. She furthermore describes pain that is in the bilateral shoulders and neck thoracic region by next hands and rates this as a VAS of 10 and best a 4 but she is never out of pain. She has tried acupuncture lying down resting sitting sleeping with minimal mprovement. Motion twisting walking seem to make the pain worse. She's had previous orthopedic evaluation.  History Teresa Krueger has a past medical history of Mini stroke ([place]); Asthma; Diabetes mellitus ([place]); Hypertension; Breast cancer, left ([place]); Lymphedema; COPD (chronic obstructive pulmonary disease) ([place]); Arthritis; Bronchiectasis ([place]); Hypercholesterolemia; Barrett's esophagus; Osteoporosis; Breast cancer ([place]) (1999); and Pacemaker.   She has past surgical history that includes Breast lumpectomy (1999); Total hip arthroplasty (2001); Gallbladder surgery; Cataract extraction; Carpal tunnel release; Trigger finger release; Pacemaker insertion (Right, 03/21/2015); and Mastectomy  (1999).   Her family history includes Breast cancer (age of onset: 77) in her paternal aunt; Diabetes in her paternal grandmother; Heart disease in her father, maternal grandmother, mother, and paternal grandmother; Kidney disease in her cousin; Lung cancer (age of onset: 16) in her brother; Stroke in her maternal aunt.She reports that she quit smoking about 22 years ago. Her smoking use included Cigarettes. She has a 30 pack-year smoking history. She has never used smokeless tobacco. She reports that she does not drink alcohol or use illicit drugs.  No results found for this or any previous visit.  No results found for: TOXASSSELUR  Outpatient Prescriptions Prior to Visit  Medication Sig Dispense Refill  . acetaminophen (TYLENOL) 500 MG tablet Take 500 mg by mouth 3 (three) times daily.    Marland Kitchen albuterol (PROVENTIL HFA;VENTOLIN HFA) 108 (90 BASE) MCG/ACT inhaler Inhale 2 puffs into the lungs every 6 (six) hours as needed for wheezing or shortness of breath.    . ALPRAZolam (XANAX) 0.5 MG tablet Take 1 tablet (0.5 mg total) by mouth 3 (three) times daily as needed for anxiety. 60 tablet 0  . Fluticasone-Salmeterol (ADVAIR) 250-50 MCG/DOSE AEPB Inhale 1 puff into the lungs every 12 (twelve) hours. 1 each 0  . insulin lispro protamine-lispro (HUMALOG 75/25 MIX) (75-25) 100 UNIT/ML SUSP injection Inject 15 Units into the skin 2 (two) times daily. (Patient taking differently: Inject 10-12 Units into the skin 2 (two) times daily. ) 10 mL 11  . KLOR-CON M20 20 MEQ tablet Reported on 09/21/2015    . lidocaine (LIDODERM) 5 % Place 2 patches onto the skin daily. Remove & Discard patch within 12  hours or as directed by MD    . losartan (COZAAR) 25 MG tablet Take 25 mg by mouth daily.    . mometasone (ELOCON) 0.1 % lotion Reported on 12/05/2015    . Multiple Vitamin (MULTIVITAMIN WITH MINERALS) TABS tablet Take 1 tablet by mouth daily.    . mupirocin ointment (BACTROBAN) 2 % Reported on 12/05/2015    .  omeprazole (PRILOSEC) 20 MG capsule Take 1 capsule (20 mg total) by mouth 2 (two) times daily. 60 capsule 5  . simvastatin (ZOCOR) 20 MG tablet Take 20 mg by mouth daily.    Marland Kitchen spironolactone (ALDACTONE) 25 MG tablet Take 25 mg by mouth every other day.     . sucralfate (CARAFATE) 1 G tablet Take 1 g by mouth 4 (four) times daily -  with meals and at bedtime.     . torsemide (DEMADEX) 5 MG tablet Take 1 tablet (5 mg total) by mouth daily. (Patient taking differently: Take 5 mg by mouth every other day. ) 30 tablet 1  . traMADol (ULTRAM) 50 MG tablet Take by mouth.    . fluticasone (FLONASE) 50 MCG/ACT nasal spray Place 2 sprays into both nostrils daily. (Patient taking differently: Place 2 sprays into both nostrils daily as needed for rhinitis. ) 48 g 2   No facility-administered medications prior to visit.   Lab Results  Component Value Date   WBC 6.2 03/22/2015   HGB 12.9 03/22/2015   HCT 38.3 03/22/2015   PLT 158 03/22/2015   GLUCOSE 153* 03/22/2015   ALT 20 03/20/2015   AST 28 03/20/2015   NA 140 03/22/2015   K 4.0 03/22/2015   CL 107 03/22/2015   CREATININE 0.86 03/22/2015   BUN 23* 03/22/2015   CO2 24 03/22/2015   TSH 1.364 03/17/2015   INR 1.16 03/20/2015    --------------------------------------------------------------------------------------------------------------------- US Breast Ltd Uni Right Inc Axilla  09/26/2015  CLINICAL DATA:  Patient presents for diagnostic right breast examination to evaluate a palpable abnormality over the inner midportion of the right breast unchanged over 1 year. Previous malignant left mastectomy 1999. EXAM: 2D DIGITAL DIAGNOSTIC right MAMMOGRAM WITH CAD AND ADJUNCT TOMO ULTRASOUND right BREAST COMPARISON:  Previous exam(s). ACR Breast Density Category b: There are scattered areas of fibroglandular density. FINDINGS: Exam demonstrates no focal abnormality over the inner midportion of the right breast to correspond to patient's palpable abnormality.  Remainder of the exam is unchanged. Mammographic images were processed with CAD. On physical exam, I palpate a 2 cm oval spongy mass over the 3 o'clock position of the right breast corresponding to patient's palpable abnormality. Targeted ultrasound is performed, showing an oval circumscribed homogeneously isoechoic to hyperechoic mass just below the skin at the 3 o'clock position 12 cm from the nipple corresponding to patient's palpable abnormality. This measures 0.8 x 2.1 x 2.3 cm and is diagnostic of a lipoma. IMPRESSION: Lipoma over the 3 o'clock position of the right breast 12 cm from the nipple measuring 0.8 x 2.1 x 2.3 cm corresponding to patient's palpable abnormality. RECOMMENDATION: Recommend continued management of patient's right breast lipoma on a clinical basis. Otherwise, recommend continued annual screening mammographic followup of the right breast. I have discussed the findings and recommendations with the patient. Results were also provided in writing at the conclusion of the visit. If applicable, a reminder letter will be sent to the patient regarding the next appointment. BI-RADS CATEGORY  2: Benign. Electronically Signed   By: Marin Olp M.D.   On: 09/26/2015  14:43   Mm Diag Breast Tomo Uni Right  09/26/2015  CLINICAL DATA:  Patient presents for diagnostic right breast examination to evaluate a palpable abnormality over the inner midportion of the right breast unchanged over 1 year. Previous malignant left mastectomy 1999. EXAM: 2D DIGITAL DIAGNOSTIC right MAMMOGRAM WITH CAD AND ADJUNCT TOMO ULTRASOUND right BREAST COMPARISON:  Previous exam(s). ACR Breast Density Category b: There are scattered areas of fibroglandular density. FINDINGS: Exam demonstrates no focal abnormality over the inner midportion of the right breast to correspond to patient's palpable abnormality. Remainder of the exam is unchanged. Mammographic images were processed with CAD. On physical exam, I palpate a 2 cm oval  spongy mass over the 3 o'clock position of the right breast corresponding to patient's palpable abnormality. Targeted ultrasound is performed, showing an oval circumscribed homogeneously isoechoic to hyperechoic mass just below the skin at the 3 o'clock position 12 cm from the nipple corresponding to patient's palpable abnormality. This measures 0.8 x 2.1 x 2.3 cm and is diagnostic of a lipoma. IMPRESSION: Lipoma over the 3 o'clock position of the right breast 12 cm from the nipple measuring 0.8 x 2.1 x 2.3 cm corresponding to patient's palpable abnormality. RECOMMENDATION: Recommend continued management of patient's right breast lipoma on a clinical basis. Otherwise, recommend continued annual screening mammographic followup of the right breast. I have discussed the findings and recommendations with the patient. Results were also provided in writing at the conclusion of the visit. If applicable, a reminder letter will be sent to the patient regarding the next appointment. BI-RADS CATEGORY  2: Benign. Electronically Signed   By: Marin Olp M.D.   On: 09/26/2015 14:43       ---------------------------------------------------------------------------------------------------------------------- Past Medical History  Diagnosis Date  . Mini stroke ([place])   . Asthma   . Diabetes mellitus ([place])   . Hypertension   . Breast cancer, left ([place])   . Lymphedema   . COPD (chronic obstructive pulmonary disease) ([place])   . Arthritis   . Bronchiectasis ([place])   . Hypercholesterolemia   . Barrett's esophagus   . Osteoporosis   . Breast cancer ([place]) 1999    left breast  . Pacemaker     Past Surgical History  Procedure Laterality Date  . Breast lumpectomy  1999    left  . Total hip arthroplasty  2001    left  . Gallbladder surgery    . Cataract extraction      bilateral  . Carpal tunnel release      right  . Trigger finger release      bilateral  . Pacemaker insertion Right 03/21/2015    Procedure:  INSERTION PACEMAKER;  Surgeon: Isaias Cowman, MD;  Location: [facility] ORS;  Service: Cardiovascular;  Laterality: Right;  . Mastectomy  1999    left    Family History  Problem Relation Age of Onset  . Heart disease Father   . Heart disease Mother   . Heart disease Maternal Grandmother   . Heart disease Paternal Grandmother   . Diabetes Paternal Grandmother   . Stroke Maternal Aunt   . Breast cancer Paternal Aunt 94  . Lung cancer Brother 63  . Kidney disease Cousin     Social History  Substance Use Topics  . Smoking status: Former Smoker -- 1.00 packs/day for 30 years    Types: Cigarettes    Quit date: 12/27/1992  . Smokeless tobacco: Never Used  . Alcohol Use: No    ---------------------------------------------------------------------------------------------------------------------- Social History  Social History  . Marital Status: Married    Spouse Name: N/A  . Number of Children: 1  . Years of Education: N/A   Occupational History  . Retired     worked as a  Customer service manager x 84 yrs   Social History Main Topics  . Smoking status: Former Smoker -- 1.00 packs/day for 30 years    Types: Cigarettes    Quit date: 12/27/1992  . Smokeless tobacco: Never Used  . Alcohol Use: No  . Drug Use: No  . Sexual Activity: Not Asked   Other Topics Concern  . None   Social History Narrative    Scheduled Meds: Continuous Infusions: PRN Meds:.   BP 135/40 mmHg  Pulse 68  Temp(Src) 98.1 F (36.7 C) (Oral)  Resp 16  Ht 5' (1.524 m)  Wt 270 lb (122.471 kg)  BMI 52.73 kg/m2  SpO2 98%   BP Readings from Last 3 Encounters:  12/12/15 135/40  12/05/15 147/65  10/20/15 142/82     Wt Readings from Last 3 Encounters:  12/12/15 270 lb (122.471 kg)  12/05/15 274 lb (124.286 kg)  10/20/15 278 lb (126.1 kg)     ----------------------------------------------------------------------------------------------------------------------  ROS Review of Systems cardiac: History  of abnormal heart rhythm high blood pressure pacemaker heart murmur heart valve problems with previous heart catheterization. Pulmonary: Emphysema shortness of breath bronchitis sleep apnea Neurologic history of CVA scoliosis peripheral neuropathy Psychologic: Anxiety and insomnia GI: Ulcer hiatal hernia reflux IBS constipation GU kidney kidney stones Endocrine: Diabetes Rheumatologic osteoarthritis and fibro myalgia   Objective:  BP 135/40 mmHg  Pulse 68  Temp(Src) 98.1 F (36.7 C) (Oral)  Resp 16  Ht 5' (1.524 m)  Wt 270 lb (122.471 kg)  BMI 52.73 kg/m2  SpO2 98%  Physical Exam patient is good historian cooperative compliant and sits comfortably in a wheelchair Heart is regular rate and rhythm without murmur Lungs are clear to also dictation with no rales or wheezes Inspection low back reveals some paraspinous muscle tenderness. She furthermore has lower extremity edema and some chronic cutaneous erythema to the mid shin level to the feet     Assessment & Plan:   Teresa Krueger was seen today for neck pain, back pain and hip pain.  Diagnoses and all orders for this visit:  DDD (degenerative disc disease), lumbar -     LUMBAR EPIDURAL STEROID INJECTION; Future  Spinal stenosis of lumbosacral region -     LUMBAR EPIDURAL STEROID INJECTION; Future  Primary osteoarthritis involving multiple joints -     LUMBAR EPIDURAL STEROID INJECTION; Future     ----------------------------------------------------------------------------------------------------------------------  Problem List Items Addressed This Visit    None    Visit Diagnoses    DDD (degenerative disc disease), lumbar    -  Primary    Relevant Orders    LUMBAR EPIDURAL STEROID INJECTION    Spinal stenosis of lumbosacral region        Relevant Orders    LUMBAR EPIDURAL STEROID INJECTION    Primary osteoarthritis involving multiple joints        Relevant Orders    LUMBAR EPIDURAL STEROID INJECTION        ----------------------------------------------------------------------------------------------------------------------  1. DDD (degenerative disc disease), lumbar Plan on a lumbar epidural steroid at her next visit. She responded favorably to these in the distant past that think this would be reasonable. Gone over the risks benefits of the procedure with her in full detail all questions are answered - LUMBAR EPIDURAL STEROID INJECTION; Future  2. Spinal stenosis of lumbosacral region  - LUMBAR EPIDURAL STEROID INJECTION; Future  3. Primary osteoarthritis involving multiple joints I do think she could increase her Ultram to 3 times a day if indicated. - LUMBAR EPIDURAL STEROID INJECTION; Future    ----------------------------------------------------------------------------------------------------------------------  I am having Teresa Krueger maintain her losartan, spironolactone, fluticasone, omeprazole, Fluticasone-Salmeterol, sucralfate, albuterol, lidocaine, multivitamin with minerals, simvastatin, acetaminophen, ALPRAZolam, insulin lispro protamine-lispro, torsemide, KLOR-CON M20, traMADol, mometasone, and mupirocin ointment.   No orders of the defined types were placed in this encounter.       Follow-up: Return in about 1 month (around 01/11/2016) for procedure.    Molli Barrows, MD  This dictation was performed utilizing Dragon voice recognition software.  Please excuse any unintentional or mistaken typographical errors as a result of its unedited utilization.

## 2015-12-12 NOTE — Patient Instructions (Addendum)
GENERAL RISKS AND COMPLICATIONS  What are the risk, side effects and possible complications? Generally speaking, most procedures are safe.  However, with any procedure there are risks, side effects, and the possibility of complications.  The risks and complications are dependent upon the sites that are lesioned, or the type of nerve block to be performed.  The closer the procedure is to the spine, the more serious the risks are.  Great care is taken when placing the radio frequency needles, block needles or lesioning probes, but sometimes complications can occur. 1. Infection: Any time there is an injection through the skin, there is a risk of infection.  This is why sterile conditions are used for these blocks.  There are four possible types of infection. 1. Localized skin infection. 2. Central Nervous System Infection-This can be in the form of Meningitis, which can be deadly. 3. Epidural Infections-This can be in the form of an epidural abscess, which can cause pressure inside of the spine, causing compression of the spinal cord with subsequent paralysis. This would require an emergency surgery to decompress, and there are no guarantees that the patient would recover from the paralysis. 4. Discitis-This is an infection of the intervertebral discs.  It occurs in about 1% of discography procedures.  It is difficult to treat and it may lead to surgery.        2. Pain: the needles have to go through skin and soft tissues, will cause soreness.       3. Damage to internal structures:  The nerves to be lesioned may be near blood vessels or    other nerves which can be potentially damaged.       4. Bleeding: Bleeding is more common if the patient is taking blood thinners such as  aspirin, Coumadin, Ticiid, Plavix, etc., or if he/she have some genetic predisposition  such as hemophilia. Bleeding into the spinal canal can cause compression of the spinal  cord with subsequent paralysis.  This would require an  emergency surgery to  decompress and there are no guarantees that the patient would recover from the  paralysis.       5. Pneumothorax:  Puncturing of a lung is a possibility, every time a needle is introduced in  the area of the chest or upper back.  Pneumothorax refers to free air around the  collapsed lung(s), inside of the thoracic cavity (chest cavity).  Another two possible  complications related to a similar event would include: Hemothorax and Chylothorax.   These are variations of the Pneumothorax, where instead of air around the collapsed  lung(s), you may have blood or chyle, respectively.       6. Spinal headaches: They may occur with any procedures in the area of the spine.       7. Persistent CSF (Cerebro-Spinal Fluid) leakage: This is a rare problem, but may occur  with prolonged intrathecal or epidural catheters either due to the formation of a fistulous  track or a dural tear.       8. Nerve damage: By working so close to the spinal cord, there is always a possibility of  nerve damage, which could be as serious as a permanent spinal cord injury with  paralysis.       9. Death:  Although rare, severe deadly allergic reactions known as "Anaphylactic  reaction" can occur to any of the medications used.      10. Worsening of the symptoms:  We can always make thing worse.    What are the chances of something like this happening? Chances of any of this occuring are extremely low.  By statistics, you have more of a chance of getting killed in a motor vehicle accident: while driving to the hospital than any of the above occurring .  Nevertheless, you should be aware that they are possibilities.  In general, it is similar to taking a shower.  Everybody knows that you can slip, hit your head and get killed.  Does that mean that you should not shower again?  Nevertheless always keep in mind that statistics do not mean anything if you happen to be on the wrong side of them.  Even if a procedure has a 1  (one) in a 1,000,000 (million) chance of going wrong, it you happen to be that one..Also, keep in mind that by statistics, you have more of a chance of having something go wrong when taking medications.  Who should not have this procedure? If you are on a blood thinning medication (e.g. Coumadin, Plavix, see list of "Blood Thinners"), or if you have an active infection going on, you should not have the procedure.  If you are taking any blood thinners, please inform your physician.  How should I prepare for this procedure?  Do not eat or drink anything at least six hours prior to the procedure.  Bring a driver with you .  It cannot be a taxi.  Come accompanied by an adult that can drive you back, and that is strong enough to help you if your legs get weak or numb from the local anesthetic.  Take all of your medicines the morning of the procedure with just enough water to swallow them.  If you have diabetes, make sure that you are scheduled to have your procedure done first thing in the morning, whenever possible.  If you have diabetes, take only half of your insulin dose and notify our nurse that you have done so as soon as you arrive at the clinic.  If you are diabetic, but only take blood sugar pills (oral hypoglycemic), then do not take them on the morning of your procedure.  You may take them after you have had the procedure.  Do not take aspirin or any aspirin-containing medications, at least eleven (11) days prior to the procedure.  They may prolong bleeding.  Wear loose fitting clothing that may be easy to take off and that you would not mind if it got stained with Betadine or blood.  Do not wear any jewelry or perfume  Remove any nail coloring.  It will interfere with some of our monitoring equipment.  NOTE: Remember that this is not meant to be interpreted as a complete list of all possible complications.  Unforeseen problems may occur.  BLOOD THINNERS The following drugs  contain aspirin or other products, which can cause increased bleeding during surgery and should not be taken for 2 weeks prior to and 1 week after surgery.  If you should need take something for relief of minor pain, you may take acetaminophen which is found in Tylenol,m Datril, Anacin-3 and Panadol. It is not blood thinner. The products listed below are.  Do not take any of the products listed below in addition to any listed on your instruction sheet.  A.P.C or A.P.C with Codeine Codeine Phosphate Capsules #3 Ibuprofen Ridaura  ABC compound Congesprin Imuran rimadil  Advil Cope Indocin Robaxisal  Alka-Seltzer Effervescent Pain Reliever and Antacid Coricidin or Coricidin-D  Indomethacin Rufen    Alka-Seltzer plus Cold Medicine Cosprin Ketoprofen S-A-C Tablets  Anacin Analgesic Tablets or Capsules Coumadin Korlgesic Salflex  Anacin Extra Strength Analgesic tablets or capsules CP-2 Tablets Lanoril Salicylate  Anaprox Cuprimine Capsules Levenox Salocol  Anexsia-D Dalteparin Magan Salsalate  Anodynos Darvon compound Magnesium Salicylate Sine-off  Ansaid Dasin Capsules Magsal Sodium Salicylate  Anturane Depen Capsules Marnal Soma  APF Arthritis pain formula Dewitt's Pills Measurin Stanback  Argesic Dia-Gesic Meclofenamic Sulfinpyrazone  Arthritis Bayer Timed Release Aspirin Diclofenac Meclomen Sulindac  Arthritis pain formula Anacin Dicumarol Medipren Supac  Analgesic (Safety coated) Arthralgen Diffunasal Mefanamic Suprofen  Arthritis Strength Bufferin Dihydrocodeine Mepro Compound Suprol  Arthropan liquid Dopirydamole Methcarbomol with Aspirin Synalgos  ASA tablets/Enseals Disalcid Micrainin Tagament  Ascriptin Doan's Midol Talwin  Ascriptin A/D Dolene Mobidin Tanderil  Ascriptin Extra Strength Dolobid Moblgesic Ticlid  Ascriptin with Codeine Doloprin or Doloprin with Codeine Momentum Tolectin  Asperbuf Duoprin Mono-gesic Trendar  Aspergum Duradyne Motrin or Motrin IB Triminicin  Aspirin  plain, buffered or enteric coated Durasal Myochrisine Trigesic  Aspirin Suppositories Easprin Nalfon Trillsate  Aspirin with Codeine Ecotrin Regular or Extra Strength Naprosyn Uracel  Atromid-S Efficin Naproxen Ursinus  Auranofin Capsules Elmiron Neocylate Vanquish  Axotal Emagrin Norgesic Verin  Azathioprine Empirin or Empirin with Codeine Normiflo Vitamin E  Azolid Emprazil Nuprin Voltaren  Bayer Aspirin plain, buffered or children's or timed BC Tablets or powders Encaprin Orgaran Warfarin Sodium  Buff-a-Comp Enoxaparin Orudis Zorpin  Buff-a-Comp with Codeine Equegesic Os-Cal-Gesic   Buffaprin Excedrin plain, buffered or Extra Strength Oxalid   Bufferin Arthritis Strength Feldene Oxphenbutazone   Bufferin plain or Extra Strength Feldene Capsules Oxycodone with Aspirin   Bufferin with Codeine Fenoprofen Fenoprofen Pabalate or Pabalate-SF   Buffets II Flogesic Panagesic   Buffinol plain or Extra Strength Florinal or Florinal with Codeine Panwarfarin   Buf-Tabs Flurbiprofen Penicillamine   Butalbital Compound Four-way cold tablets Penicillin   Butazolidin Fragmin Pepto-Bismol   Carbenicillin Geminisyn Percodan   Carna Arthritis Reliever Geopen Persantine   Carprofen Gold's salt Persistin   Chloramphenicol Goody's Phenylbutazone   Chloromycetin Haltrain Piroxlcam   Clmetidine heparin Plaquenil   Cllnoril Hyco-pap Ponstel   Clofibrate Hydroxy chloroquine Propoxyphen         Before stopping any of these medications, be sure to consult the physician who ordered them.  Some, such as Coumadin (Warfarin) are ordered to prevent or treat serious conditions such as "deep thrombosis", "pumonary embolisms", and other heart problems.  The amount of time that you may need off of the medication may also vary with the medication and the reason for which you were taking it.  If you are taking any of these medications, please make sure you notify your pain physician before you undergo any  procedures.         Epidural Steroid Injection Patient Information  Description: The epidural space surrounds the nerves as they exit the spinal cord.  In some patients, the nerves can be compressed and inflamed by a bulging disc or a tight spinal canal (spinal stenosis).  By injecting steroids into the epidural space, we can bring irritated nerves into direct contact with a potentially helpful medication.  These steroids act directly on the irritated nerves and can reduce swelling and inflammation which often leads to decreased pain.  Epidural steroids may be injected anywhere along the spine and from the neck to the low back depending upon the location of your pain.   After numbing the skin with local anesthetic (like Novocaine), a small needle is passed   into the epidural space slowly.  You may experience a sensation of pressure while this is being done.  The entire block usually last less than 10 minutes.  Conditions which may be treated by epidural steroids:   Low back and leg pain  Neck and arm pain  Spinal stenosis  Post-laminectomy syndrome  Herpes zoster (shingles) pain  Pain from compression fractures  Preparation for the injection:  1. Do not eat any solid food or dairy products within 8 hours of your appointment.  2. You may drink clear liquids up to 3 hours before appointment.  Clear liquids include water, black coffee, juice or soda.  No milk or cream please. 3. You may take your regular medication, including pain medications, with a sip of water before your appointment  Diabetics should hold regular insulin (if taken separately) and take 1/2 normal NPH dos the morning of the procedure.  Carry some sugar containing items with you to your appointment. 4. A driver must accompany you and be prepared to drive you home after your procedure.  5. Bring all your current medications with your. 6. An IV may be inserted and sedation may be given at the discretion of the  physician.   7. A blood pressure cuff, EKG and other monitors will often be applied during the procedure.  Some patients may need to have extra oxygen administered for a short period. 8. You will be asked to provide medical information, including your allergies, prior to the procedure.  We must know immediately if you are taking blood thinners (like Coumadin/Warfarin)  Or if you are allergic to IV iodine contrast (dye). We must know if you could possible be pregnant.  Possible side-effects:  Bleeding from needle site  Infection (rare, may require surgery)  Nerve injury (rare)  Numbness & tingling (temporary)  Difficulty urinating (rare, temporary)  Spinal headache ( a headache worse with upright posture)  Light -headedness (temporary)  Pain at injection site (several days)  Decreased blood pressure (temporary)  Weakness in arm/leg (temporary)  Pressure sensation in back/neck (temporary)  Call if you experience:  Fever/chills associated with headache or increased back/neck pain.  Headache worsened by an upright position.  New onset weakness or numbness of an extremity below the injection site  Hives or difficulty breathing (go to the emergency room)  Inflammation or drainage at the infection site  Severe back/neck pain  Any new symptoms which are concerning to you  Please note:  Although the local anesthetic injected can often make your back or neck feel good for several hours after the injection, the pain will likely return.  It takes 3-7 days for steroids to work in the epidural space.  You may not notice any pain relief for at least that one week.  If effective, we will often do a series of three injections spaced 3-6 weeks apart to maximally decrease your pain.  After the initial series, we generally will wait several months before considering a repeat injection of the same type. patient states she will call back if she decides to schedule epidural.  If you have  any questions, please call [phone] [facility]

## 2015-12-13 ENCOUNTER — Encounter: Payer: Self-pay | Admitting: Obstetrics and Gynecology

## 2015-12-14 ENCOUNTER — Encounter: Payer: Self-pay | Admitting: Obstetrics and Gynecology

## 2015-12-21 ENCOUNTER — Ambulatory Visit: Payer: Medicare Other | Admitting: Urology

## 2015-12-21 ENCOUNTER — Ambulatory Visit
Admission: RE | Admit: 2015-12-21 | Discharge: 2015-12-21 | Disposition: A | Discharge status: Home / Self Care | Payer: Medicare Other | Source: Ambulatory Visit | Attending: Urology | Admitting: Urology

## 2015-12-21 DIAGNOSIS — N183 Chronic kidney disease, stage 3 unspecified: Secondary | ICD-10-CM

## 2015-12-27 ENCOUNTER — Ambulatory Visit (INDEPENDENT_AMBULATORY_CARE_PROVIDER_SITE_OTHER): Payer: Medicare Other | Admitting: Urology

## 2015-12-27 ENCOUNTER — Encounter: Payer: Self-pay | Admitting: Urology

## 2015-12-27 VITALS — BP 159/63 | HR 72 | Ht 61.0 in | Wt 264.3 lb

## 2015-12-27 DIAGNOSIS — N189 Chronic kidney disease, unspecified: Secondary | ICD-10-CM

## 2015-12-27 DIAGNOSIS — R39198 Other difficulties with micturition: Secondary | ICD-10-CM | POA: Diagnosis not present

## 2015-12-27 DIAGNOSIS — N952 Postmenopausal atrophic vaginitis: Secondary | ICD-10-CM | POA: Diagnosis not present

## 2015-12-27 MED ORDER — ESTRADIOL 0.1 MG/GM VA CREA: TOPICAL_CREAM | VAGINAL | Status: DC

## 2015-12-27 MED ORDER — ESTROGENS, CONJUGATED 0.625 MG/GM VA CREA: 1.0000 | TOPICAL_CREAM | Freq: Every day | VAGINAL | Status: DC

## 2015-12-27 NOTE — Patient Instructions (Signed)
I have given you two prescriptions for a vaginal estrogen cream.  Estrace and Premarin.  Please take these to your pharmacy and see which one your insurance covers.  If both are too expensive, please call the office at [phone] for an alternative.  You are given a sample of vaginal estrogen cream (Premarin) and instructed to apply 0.5mg  (pea-sized amount)  just inside the vaginal introitus with a finger-tip every night for two weeks then Monday, Wednesday and Friday nights.

## 2015-12-27 NOTE — Progress Notes (Signed)
12:22 PM   Teresa Krueger [birth year] [id number]  Referring provider: Adin Hector, MD [place], [place] [address]  Chief Complaint  Patient presents with  . Results    RUS    HPI: Patient is an 82 year old WF who presents today for a vaginal examination and her RUS results.  Background history  Patient was referred to [place] by her PCP, Dr. [name], for difficulty with urination.  She states that she retains fluid and is not feeling like she is emptying her bladder. She experiences urgency, nocturia 3-4, a weak urinary stream and intermittency.  She is not experiencing urinary frequency, gross hematuria or suprapubic pain.  She does not have urinary incontinence.  She did have dysuria approximately 8 weeks ago with a urinary tract infection.  She states that she does not have a history of recurrent urinary tract infections.  She has good fluid intake with 2 cups of coffee and 5 cups of water daily.  She states she does have diarrhea, but she suffers from irritable bowel syndrome.  She does have stage III renal disease and peripheral edema.  She is mostly nonambulatory and uses a motorized scooter.  She has a history of nephrolithiasis and was followed by Dr. Maryan Puls, but she has not seen him in years.  She has not had flank pain or passage of any fragments recently.   Her UA at her last visit was unremarkable.  Her renal ultrasound completed on 12/21/2015 noted a mildly prominent right renal pelvis without significant hydronephrosis and the left kidney and bladder which were normal in appearance. The prominent right renal pelvis is most likely congenital and noncontributory to her urinary symptoms or her chronic kidney disease.  I have reviewed the results and images with the patient.  Her symptoms have not changed since she has last visit with [place]. She is still experiencing nocturia, intermittency and straining to urinate.  She is not  experiencing dysuria, gross hematuria or suprapubic pain. She has not had recent fevers, chills, nausea or vomiting.    PMH: Past Medical History  Diagnosis Date  . Mini stroke ([place])   . Asthma   . Diabetes mellitus ([place])   . Hypertension   . Breast cancer, left ([place])   . Lymphedema   . COPD (chronic obstructive pulmonary disease) ([place])   . Arthritis   . Bronchiectasis ([place])   . Hypercholesterolemia   . Barrett's esophagus   . Osteoporosis   . Breast cancer ([place]) 1999    left breast  . Pacemaker     Surgical History: Past Surgical History  Procedure Laterality Date  . Breast lumpectomy  1999    left  . Total hip arthroplasty  2001    left  . Gallbladder surgery    . Cataract extraction      bilateral  . Carpal tunnel release      right  . Trigger finger release      bilateral  . Pacemaker insertion Right 03/21/2015    Procedure: INSERTION PACEMAKER;  Surgeon: Isaias Cowman, MD;  Location: [facility] ORS;  Service: Cardiovascular;  Laterality: Right;  . Mastectomy  1999    left    Home Medications:    Medication List       This list is accurate as of: 12/27/15 12:22 PM.  Always use your most recent med list.  acetaminophen 500 MG tablet  Commonly known as:  TYLENOL  Take 500 mg by mouth 3 (three) times daily.     albuterol 108 (90 Base) MCG/ACT inhaler  Commonly known as:  PROVENTIL HFA;VENTOLIN HFA  Inhale 2 puffs into the lungs every 6 (six) hours as needed for wheezing or shortness of breath.     ALPRAZolam 0.5 MG tablet  Commonly known as:  XANAX  Take 1 tablet (0.5 mg total) by mouth 3 (three) times daily as needed for anxiety.     conjugated estrogens vaginal cream  Commonly known as:  PREMARIN  Place 1 Applicatorful vaginally daily. Apply 0.5mg  (pea-sized amount)  just inside the vaginal introitus with a finger-tip every night for two weeks and then Monday, Wednesday and Friday nights.     estradiol 0.1 MG/GM vaginal cream    Commonly known as:  ESTRACE VAGINAL  Apply 0.5mg  (pea-sized amount)  just inside the vaginal introitus with a finger-tip every night for two weeks and then Monday, Wednesday and Friday nights.     fluticasone 50 MCG/ACT nasal spray  Commonly known as:  FLONASE  Place 2 sprays into both nostrils daily.     Fluticasone-Salmeterol 250-50 MCG/DOSE Aepb  Commonly known as:  ADVAIR  Inhale 1 puff into the lungs every 12 (twelve) hours.     insulin lispro protamine-lispro (75-25) 100 UNIT/ML Susp injection  Commonly known as:  HUMALOG 75/25 MIX  Inject 15 Units into the skin 2 (two) times daily.     KLOR-CON M20 20 MEQ tablet  Generic drug:  potassium chloride SA  Reported on 09/21/2015     lidocaine 5 %  Commonly known as:  LIDODERM  Place 2 patches onto the skin daily. Remove & Discard patch within 12 hours or as directed by MD     losartan 25 MG tablet  Commonly known as:  COZAAR  Take 25 mg by mouth daily.     mometasone 0.1 % lotion  Commonly known as:  ELOCON  Reported on 12/05/2015     multivitamin with minerals Tabs tablet  Take 1 tablet by mouth daily.     mupirocin ointment 2 %  Commonly known as:  BACTROBAN  Reported on 12/05/2015     omeprazole 20 MG capsule  Commonly known as:  PRILOSEC  Take 1 capsule (20 mg total) by mouth 2 (two) times daily.     simvastatin 20 MG tablet  Commonly known as:  ZOCOR  Take 20 mg by mouth daily.     spironolactone 25 MG tablet  Commonly known as:  ALDACTONE  Take 25 mg by mouth every other day.     sucralfate 1 g tablet  Commonly known as:  CARAFATE  Take 1 g by mouth 4 (four) times daily -  with meals and at bedtime.     torsemide 5 MG tablet  Commonly known as:  DEMADEX  Take 1 tablet (5 mg total) by mouth daily.     traMADol 50 MG tablet  Commonly known as:  ULTRAM  Take by mouth.        Allergies:  Allergies  Allergen Reactions  . Aspirin Swelling and Other (See Comments)    Pt states that she has tongue  swelling.    . Celebrex [Celecoxib] Anaphylaxis  . Meloxicam Swelling  . Nsaids Anaphylaxis  . Rofecoxib Anaphylaxis  . Tolmetin Anaphylaxis  . Bactrim [Sulfamethoxazole-Trimethoprim] Hives and Itching  . Biguanide Copolymer Other (See Comments)    Reaction:  Unknown   . Buprenorphine Hcl Other (See Comments)    Reaction:  Unknown   . Ciprofloxacin Other (See Comments)    Reaction:  Unknown   . Codeine Nausea And Vomiting and Other (See Comments)    Reaction:  Syncope   . Delsym [Dextromethorphan Polistirex Er] Other (See Comments)    Reaction:  GI upset   . Dextromethorphan Hbr Other (See Comments)    Reaction:  GI upset   . Furosemide Other (See Comments)    Reaction:  Unknown   . Hydrocodone Other (See Comments)    Reaction:  Unknown   . Metformin Other (See Comments)    Reaction:  Unknown   . Morphine And Related Other (See Comments)    Reaction:  Unknown   . Penicillins Swelling and Other (See Comments)    Pt did not answer the additional questions for this medication because she does not remember.    . Pregabalin Other (See Comments)    Reaction:  Unknown   . Promethazine Other (See Comments)    Reaction:  Unknown   . Propoxyphene Other (See Comments)    Reaction:  Unknown   . Quinolones Other (See Comments)    Reaction:  Unknown   . Seroquel [Quetiapine Fumarate] Other (See Comments)    Reaction:  Unknown   . Serotonin Reuptake Inhibitors (Ssris) Other (See Comments)    Reaction:  Unknown   . Sulfa Antibiotics Hives and Itching  . Tramadol Nausea Only  . Ultracet [Tramadol-Acetaminophen] Other (See Comments)    Reaction:  Unknown     Family History: Family History  Problem Relation Age of Onset  . Heart disease Father   . Heart disease Mother   . Heart disease Maternal Grandmother   . Heart disease Paternal Grandmother   . Diabetes Paternal Grandmother   . Stroke Maternal Aunt   . Breast cancer Paternal Aunt 94  . Lung cancer Brother 48  . Kidney  disease Cousin     Social History:  reports that she quit smoking about 23 years ago. Her smoking use included Cigarettes. She has a 30 pack-year smoking history. She has never used smokeless tobacco. She reports that she does not drink alcohol or use illicit drugs.  ROS: UROLOGY Frequent Urination?: No Hard to postpone urination?: No Burning/pain with urination?: No Get up at night to urinate?: Yes Leakage of urine?: No Urine stream starts and stops?: Yes Trouble starting stream?: No Do you have to strain to urinate?: Yes Blood in urine?: No Urinary tract infection?: No Sexually transmitted disease?: No Injury to kidneys or bladder?: No Painful intercourse?: No Weak stream?: Yes Currently pregnant?: No Vaginal bleeding?: No Last menstrual period?: n  Gastrointestinal Nausea?: Yes Vomiting?: No Indigestion/heartburn?: Yes Diarrhea?: Yes Constipation?: No  Constitutional Fever: No Night sweats?: Yes Weight loss?: No Fatigue?: Yes  Skin Skin rash/lesions?: No Itching?: No  Eyes Blurred vision?: No Double vision?: No  Ears/Nose/Throat Sore throat?: No Sinus problems?: No  Hematologic/Lymphatic Swollen glands?: Yes Easy bruising?: No  Cardiovascular Leg swelling?: Yes Chest pain?: Yes  Respiratory Cough?: No Shortness of breath?: Yes  Endocrine Excessive thirst?: No  Musculoskeletal Back pain?: Yes Joint pain?: Yes  Neurological Headaches?: No Dizziness?: No  Psychologic Depression?: No Anxiety?: Yes  Physical Exam: BP 159/63 mmHg  Pulse 72  Ht 5\' 1"  (1.549 m)  Wt 264 lb 4.8 oz (119.886 kg)  BMI 49.96 kg/m2  Constitutional: Well nourished. Alert and oriented, No acute distress. HEENT: [place] AT, moist  mucus membranes. Trachea midline, no masses. Cardiovascular: No clubbing, cyanosis, or edema. Respiratory: Normal respiratory effort, no increased work of breathing. GI: Abdomen is soft, non tender, non distended, no abdominal masses.  Liver and spleen not palpable.  No hernias appreciated.  Stool sample for occult testing is not indicated.   GU: No CVA tenderness.  No bladder fullness or masses.  Atrophic external genitalia, normal pubic hair distribution, no lesions.  Normal urethral meatus, no lesions, no prolapse, no discharge.   Urethra could not be visualize due to body habitus.  Bladder, uterus, cervix and adnexal/parametria masses or tenderness could not be palpated due to body habitus.  Anus and perineum are without rashes or lesions.    Skin: No rashes, bruises or suspicious lesions. Lymph: No cervical or inguinal adenopathy. Neurologic: Grossly intact, no focal deficits, moving all 4 extremities. Psychiatric: Normal mood and affect.  Laboratory Data: Lab Results  Component Value Date   WBC 6.2 03/22/2015   HGB 12.9 03/22/2015   HCT 38.3 03/22/2015   MCV 87.1 03/22/2015   PLT 158 03/22/2015    Lab Results  Component Value Date   CREATININE 0.86 03/22/2015    Lab Results  Component Value Date   TSH 1.364 03/17/2015     Lab Results  Component Value Date   AST 28 03/20/2015   Lab Results  Component Value Date   ALT 20 03/20/2015     Urinalysis Results for orders placed or performed in visit on 12/05/15  Microscopic Examination  Result Value Ref Range   WBC, UA 6-10 (A) 0 -  5 /hpf   RBC, UA None seen 0 -  2 /hpf   Epithelial Cells (non renal) 0-10 0 - 10 /hpf   Bacteria, UA Few (A) None seen/Few  Urinalysis, Complete  Result Value Ref Range   Specific Gravity, UA <1.005 (L) 1.005 - 1.030   pH, UA 6.0 5.0 - 7.5   Color, UA Yellow Yellow   Appearance Ur Clear Clear   Leukocytes, UA 2+ (A) Negative   Protein, UA Trace (A) Negative/Trace   Glucose, UA Negative Negative   Ketones, UA Negative Negative   RBC, UA Negative Negative   Bilirubin, UA Negative Negative   Urobilinogen, Ur 0.2 0.2 - 1.0 mg/dL   Nitrite, UA Negative Negative   Microscopic Examination See below:     Pertinent  Imaging: CLINICAL DATA: Chronic kidney disease, stage III.  EXAM: RENAL / URINARY TRACT ULTRASOUND COMPLETE  COMPARISON: None.  FINDINGS: Right Kidney:  Length: 10.2 cm. Echogenicity is normal. No focal mass. Mildly prominent renal pelvis without significant hydronephrosis.  Left Kidney:  Length: 10.6 cm bilateral ureteral jets are present. Echogenicity within normal limits. No mass or hydronephrosis visualized.  Bladder:  Appears normal for degree of bladder distention.  IMPRESSION: 1. Mildly prominent right renal pelvis without significant hydronephrosis. 2. Left kidney and bladder are normal in appearance.   Electronically Signed  By: Nolon Nations M.D.  On: 12/21/2015 14:52  Assessment & Plan:    1. Difficulty with urination:  Renal ultrasound performed on 02/29/2017 did not note a large residual. Exam today demonstrated vaginal atrophy which may be contributing to her urinary symptoms. I have initiated her on estrogen cream and she will follow-up in 3 months for exam and symptom recheck.  2. Vaginal atrophy:   I explained to the patient that when women go through menopause and her estrogen levels are severely diminished, the normal vaginal mucosa will change.  This can  result in the narrowing of the vaginal introitus and the drawing in of the urethral meatus.  This can cause difficulty with urination.  Patient was given a sample of vaginal estrogen cream (Premarin) and instructed to apply 0.5mg  (pea-sized amount)  just inside the vaginal introitus with a finger-tip every night for two weeks and then Monday, Wednesday and Friday nights.  I explained to the patient that vaginally administered estrogen, which causes only a slight increase in the blood estrogen levels, have fewer contraindications and adverse systemic effects that oral HT.  I have also given prescriptions for the Estrace cream and Premarin cream, so that the patient may carry them to the  pharmacy to see which one of the branded creams would be most economical for her.  She will return in 3 months for symptom recheck, exam and report if she was available to require the vaginal cream by prescription.   3. Stage III kidney disease:  Patient is really concerned about her kidney disease. She became quite tearful and wanted a referral to a nephrologist to review her medications.  I will go ahead and make that referral for her.  Return in about 3 months (around 03/28/2016) for exam and symptom recheck.  These notes generated with voice recognition software. I apologize for typographical errors.  Zara Council, [facility] [address] [facility] [place], [place] [address] [phone]

## 2016-01-02 ENCOUNTER — Telehealth: Payer: Self-pay | Admitting: Urology

## 2016-01-02 NOTE — Telephone Encounter (Signed)
Faxed over notes to [facility] [phone] and Leafy Ro will call the patient with the appt info  Teresa Krueger

## 2016-01-09 ENCOUNTER — Encounter: Payer: Medicare Other | Admitting: Obstetrics and Gynecology

## 2016-01-10 ENCOUNTER — Ambulatory Visit: Payer: Medicare Other | Admitting: Anesthesiology

## 2016-02-07 ENCOUNTER — Ambulatory Visit
Admission: RE | Admit: 2016-02-07 | Discharge: 2016-02-07 | Disposition: A | Discharge status: Home / Self Care | Payer: Medicare Other | Source: Ambulatory Visit | Attending: Internal Medicine | Admitting: Internal Medicine

## 2016-02-07 DIAGNOSIS — R911 Solitary pulmonary nodule: Secondary | ICD-10-CM | POA: Diagnosis present

## 2016-02-07 DIAGNOSIS — I2510 Atherosclerotic heart disease of native coronary artery without angina pectoris: Secondary | ICD-10-CM | POA: Insufficient documentation

## 2016-02-07 DIAGNOSIS — I700 Atherosclerosis of aorta: Secondary | ICD-10-CM | POA: Diagnosis not present

## 2016-02-08 DIAGNOSIS — R911 Solitary pulmonary nodule: Secondary | ICD-10-CM | POA: Insufficient documentation

## 2016-02-08 DIAGNOSIS — I700 Atherosclerosis of aorta: Secondary | ICD-10-CM | POA: Insufficient documentation

## 2016-02-08 DIAGNOSIS — I2510 Atherosclerotic heart disease of native coronary artery without angina pectoris: Secondary | ICD-10-CM | POA: Insufficient documentation

## 2016-02-12 ENCOUNTER — Ambulatory Visit (INDEPENDENT_AMBULATORY_CARE_PROVIDER_SITE_OTHER): Payer: Medicare Other | Admitting: Internal Medicine

## 2016-02-12 ENCOUNTER — Encounter: Payer: Self-pay | Admitting: Internal Medicine

## 2016-02-12 VITALS — BP 128/68 | HR 78 | Wt 267.0 lb

## 2016-02-12 DIAGNOSIS — J9611 Chronic respiratory failure with hypoxia: Secondary | ICD-10-CM | POA: Diagnosis not present

## 2016-02-12 DIAGNOSIS — K589 Irritable bowel syndrome without diarrhea: Secondary | ICD-10-CM

## 2016-02-12 MED ORDER — FLUTTER DEVI: 0 refills | Status: DC

## 2016-02-12 NOTE — Patient Instructions (Addendum)
Follow up with GI with Dr. Halford Chessman Flutter valve during daytime continue oxygen at night  Bronchiectasis Bronchiectasis is a condition in which the airways (bronchi) are damaged and widened. This makes it difficult for the lungs to get rid of mucus. As a result, mucus gathers in the airways, and this often leads to lung infections. Infection can cause inflammation in the airways, which may further weaken and damage the bronchi.  CAUSES  Bronchiectasis may be present at birth (congenital) or may develop later in life. Sometimes there is no apparent cause. Some common causes include:  Cystic fibrosis.   Recurrent lung infections (such as pneumonia, tuberculosis, or fungal infections).  Foreign bodies or other blockages in the lungs.  Breathing in fluid, food, or other foreign objects (aspiration). SIGNS AND SYMPTOMS  Common symptoms include:  A daily cough that brings up mucus and lasts for more than 3 weeks.  Frequent lung infections (such as pneumonia, tuberculosis, or fungal infections).  Shortness of breath and wheezing.   Weakness and fatigue. DIAGNOSIS  Various tests may be done to help diagnose bronchiectasis. Tests may include:  Chest X-rays or CT scans.   Breathing tests to help determine how your lungs are working.   Sputum cultures to check for infection.   Blood tests and other tests to check for related diseases or causes, such as cystic fibrosis. TREATMENT  Treatment varies depending on the severity of the condition. Medicines may be given to loosen the mucus to be coughed up (expectorants), to relax the muscles of the air passages (bronchodilators), or to prevent or treat infections (antibiotics). Physical therapy methods may be recommended to help clear mucus from the lungs. For severe cases, surgery may be done to remove the affected part of the lung. HOME CARE INSTRUCTIONS   Get plenty of rest.   Only take over-the-counter or prescription medicines  as directed by your health care provider. If antibiotic medicines were prescribed, take them as directed. Finish them even if you start to feel better.  Avoid sedatives and antihistamines unless otherwise directed by your health care provider. These medicines tend to thicken the mucus in the lungs.   Perform any breathing exercises or techniques to clear the lungs as directed by your health care provider.  Drink enough fluids to keep your urine clear or pale yellow.  Consider using a cold steam vaporizer or humidifier in your room or home to help loosen secretions.   If the cough is worse at night, try sleeping in a semi-upright position in a recliner or using a couple of pillows.   Avoid cigarette smoke and lung irritants. If you smoke, quit.  Stay inside when pollution and ozone levels are high.   Stay current with vaccinations and immunizations.   Follow up with your health care provider as directed.  SEEK MEDICAL CARE IF:  You cough up more thick, discolored mucus (sputum) that is yellow to green in color.  You have a fever or persistent symptoms for more than 2-3 days.  You cannot control your cough and are losing sleep. SEEK IMMEDIATE MEDICAL CARE IF:   You cough up blood.   You have chest pain or increasing shortness of breath.   You have pain that is getting worse or is uncontrolled with medicines.   You have a fever and your symptoms suddenly get worse. MAKE SURE YOU:  Understand these instructions.   Will watch your condition.   Will get help right away if you are not doing  well or get worse.    This information is not intended to replace advice given to you by your health care provider. Make sure you discuss any questions you have with your health care provider.   Document Released: 04/07/2007 Document Revised: 06/15/2013 Document Reviewed: 12/16/2012 Elsevier Interactive Patient Education [employer].

## 2016-02-12 NOTE — Progress Notes (Signed)
Subjective:    Patient ID: Teresa Krueger, female    DOB: January 15, 1934, 82 y.o.   MRN: [id number]  Synopsis: justus strebe first saw the [facility] pulmonary clinic in July 2013 for cough. She has a one pack per day smoking history x30 years and quit in the 1990s. She had simple spirometry in July 2013 which showed no evidence of COPD. She has a history of childhood pertussis  She has postnasal drip and gastroesophageal reflux disease with Barrett's esophagus.   CC: follow up for Bronchiectasis  HPI  No complaints, doing really well She had a CT angiogram of her chest feb 2016 recently which was negative for PE but showed some small 61mm nodules.   I also saw bronchiectasis.- repeat CT 07/2015 shows stable nodules- Repeat CT chest 01/2016-shows stable nodules  Uses flutter valve once daily, uses albuterol infrequently No signs of infection at this time SOB/DOE same as before no change She is wheelchair bound S/p pacemaker placement   Review of Systems  Constitutional: Negative for chills, diaphoresis, fatigue and fever.  HENT: Negative for congestion, postnasal drip and rhinorrhea.   Respiratory: Negative for cough, chest tightness, shortness of breath, wheezing and stridor.   Cardiovascular: Positive for leg swelling. Negative for chest pain.  All other systems reviewed and are negative.     Objective:   Physical Exam  Constitutional: She appears well-developed and well-nourished. No distress.  HENT:  Head: Normocephalic and atraumatic.  Eyes: Pupils are equal, round, and reactive to light.  Neck: Normal range of motion. Neck supple.  Cardiovascular: Normal rate and regular rhythm.   No murmur heard. Pulmonary/Chest: Effort normal and breath sounds normal. No respiratory distress. She has no wheezes. She has no rales.  Musculoskeletal: She exhibits edema.  Skin: She is not diaphoretic.   Vitals:   02/12/16 1206  BP: 128/68  BP Location: Right Arm  Cuff Size:  Normal  Pulse: 78  SpO2: 96%  Weight: 267 lb (121.1 kg)  RA   04/2012 CT chest >> no emphysema, there is some bronchiectasis in the RML and Lingula ([place]aid read) 04/2012 CT sinuses >> no significant signs of sinusitis 04/2012 CT chest [facility] >> no emphysema or nodules, mild RML and lingula bronchiectasis  04/2012 AFB culture x3 negative February 2016 CT angiogram chest> mild lingular bronchiectasis, scattered pulmonary nodules bilaterally, largest is 6 mm, no pulmonary embolism Feb 2017 CTchest stabel nodules, recommend repeat in 6 months. 01/2016 Ct chest 1. Stable noncalcified subpleural nodule in the periphery of the right upper lobe.  2. Moderate thoracic aortic atherosclerosis. 3. Calcifications in the distribution of the left anterior descending and right coronary arteries.   Assessment & Plan:  82 yo obese white female with Bronchiectasis that seems to be stable at this time with chronic hypoxic resp failure  1.Bronchiectasis -mild bronchiectasis. She has not had an exacerbation recently.  -advised to use flutter valve more frequently during the day -continue oxygen as needed at night  2.Multiple pulmonary nodules She was noted to have multiple pulmonary nodules on her  CT chest. None of these are larger than 6 mm 1 year ago. -She has a heavy smoking history but has been greater than 30 years and she quit smoking.  -repeat Ct chest shows stable nodules will repeat in 1 year.  Follow up in 6 months  The Patient requires high complexity decision making for assessment and support, frequent evaluation and titration of therapies. Patient/Family are satisfied with Plan of action and  management. All questions answered  Corrin Parker, M.D.  [facility]  Medical Director [place] Director [facility] Cardio-Pulmonary Department

## 2016-02-13 ENCOUNTER — Emergency Department
Admission: EM | Admit: 2016-02-13 | Discharge: 2016-02-13 | Disposition: A | Discharge status: Home / Self Care | Payer: Medicare Other | Attending: Student in an Organized Health Care Education/Training Program | Admitting: Student in an Organized Health Care Education/Training Program

## 2016-02-13 ENCOUNTER — Telehealth: Payer: Self-pay | Admitting: Internal Medicine

## 2016-02-13 ENCOUNTER — Emergency Department: Payer: Medicare Other

## 2016-02-13 ENCOUNTER — Encounter: Payer: Self-pay | Admitting: Emergency Medicine

## 2016-02-13 DIAGNOSIS — R0789 Other chest pain: Secondary | ICD-10-CM | POA: Insufficient documentation

## 2016-02-13 DIAGNOSIS — J449 Chronic obstructive pulmonary disease, unspecified: Secondary | ICD-10-CM | POA: Diagnosis not present

## 2016-02-13 DIAGNOSIS — E119 Type 2 diabetes mellitus without complications: Secondary | ICD-10-CM | POA: Diagnosis not present

## 2016-02-13 DIAGNOSIS — R141 Gas pain: Secondary | ICD-10-CM | POA: Diagnosis not present

## 2016-02-13 DIAGNOSIS — R143 Flatulence: Secondary | ICD-10-CM | POA: Diagnosis not present

## 2016-02-13 DIAGNOSIS — Z950 Presence of cardiac pacemaker: Secondary | ICD-10-CM | POA: Insufficient documentation

## 2016-02-13 DIAGNOSIS — Z853 Personal history of malignant neoplasm of breast: Secondary | ICD-10-CM | POA: Diagnosis not present

## 2016-02-13 DIAGNOSIS — R142 Eructation: Secondary | ICD-10-CM | POA: Insufficient documentation

## 2016-02-13 DIAGNOSIS — J45909 Unspecified asthma, uncomplicated: Secondary | ICD-10-CM | POA: Insufficient documentation

## 2016-02-13 DIAGNOSIS — Z791 Long term (current) use of non-steroidal anti-inflammatories (NSAID): Secondary | ICD-10-CM | POA: Insufficient documentation

## 2016-02-13 DIAGNOSIS — I10 Essential (primary) hypertension: Secondary | ICD-10-CM | POA: Insufficient documentation

## 2016-02-13 DIAGNOSIS — Z87891 Personal history of nicotine dependence: Secondary | ICD-10-CM | POA: Diagnosis not present

## 2016-02-13 DIAGNOSIS — Z79899 Other long term (current) drug therapy: Secondary | ICD-10-CM | POA: Diagnosis not present

## 2016-02-13 DIAGNOSIS — Z794 Long term (current) use of insulin: Secondary | ICD-10-CM | POA: Insufficient documentation

## 2016-02-13 DIAGNOSIS — R140 Abdominal distension (gaseous): Secondary | ICD-10-CM

## 2016-02-13 LAB — CBC
HEMATOCRIT: 37.4 % (ref 35.0–47.0)
HEMOGLOBIN: 12.8 g/dL (ref 12.0–16.0)
MCH: 29.6 pg (ref 26.0–34.0)
MCHC: 34.2 g/dL (ref 32.0–36.0)
MCV: 86.6 fL (ref 80.0–100.0)
PLATELETS: 181 10*3/uL (ref 150–440)
RBC: 4.32 MIL/uL (ref 3.80–5.20)
RDW: 13.4 % (ref 11.5–14.5)
WBC: 4.7 10*3/uL (ref 3.6–11.0)

## 2016-02-13 LAB — TROPONIN I: TROPONIN I: 0.04 ng/mL — AB (ref <0.03)

## 2016-02-13 LAB — BASIC METABOLIC PANEL
ANION GAP: 8 (ref 5–15)
BUN: 31 mg/dL — AB (ref 6–20)
CHLORIDE: 103 mmol/L (ref 101–111)
CO2: 26 mmol/L (ref 22–32)
Calcium: 9.3 mg/dL (ref 8.9–10.3)
Creatinine, Ser: 1.13 mg/dL — ABNORMAL HIGH (ref 0.44–1.00)
GFR calc non Af Amer: 45 mL/min — ABNORMAL LOW (ref >60)
GFR, EST AFRICAN AMERICAN: 52 mL/min — AB (ref >60)
Glucose, Bld: 124 mg/dL — ABNORMAL HIGH (ref 65–99)
POTASSIUM: 5 mmol/L (ref 3.5–5.1)
SODIUM: 137 mmol/L (ref 135–145)

## 2016-02-13 MED ORDER — ALPRAZOLAM 0.5 MG PO TABS
0.5000 mg | ORAL_TABLET | Freq: Once | ORAL | Status: AC
  Administered 2016-02-13: 0.5 mg via ORAL
  Filled 2016-02-13: qty 1

## 2016-02-13 MED ORDER — ALPRAZOLAM 0.5 MG PO TABS
ORAL_TABLET | ORAL | Status: AC
  Filled 2016-02-13: qty 1

## 2016-02-13 NOTE — Telephone Encounter (Signed)
Spoke with DK and he says to call pt and send her to ED. Pt was seen in office yesterday by DK.   Spoke with pt and gave her the recs from [place]. Pt agreed. Nothing further needed.

## 2016-02-13 NOTE — Telephone Encounter (Signed)
Spoke with pt, c/o R sided chest pain since yesterday, radiating down into her abdomen and going around to her back.   Pt has taken mylanta and gas-x, which has helped symptoms some but s/s are still persistant.  Pt is unable to take asa as she is allergic to this.  Pt does not wish to go to ED, requesting recs from provider first.   Pt uses [facility] on [address].   [facility] BT office to route to that office as this pt is a BT patient, but was unsuccessful.  Will send message to DOD to address d/t the nature of this call.  TP please advise on recs.  Thanks!

## 2016-02-13 NOTE — ED Triage Notes (Addendum)
Pt reports right sided chest pain that radiates into her back since 1500 yesterday. Pt reports tightness around chest as well. Pt reports feeling gassy, reports taking mylanta and extra strength gas-x last night with some relief.

## 2016-02-13 NOTE — ED Provider Notes (Signed)
[facility] Emergency Department Provider Note    First MD Initiated Contact with Patient 02/13/16 1830     (approximate)  I have reviewed the triage vital signs and the nursing notes.   HISTORY  Chief Complaint Chest Pain    HPI Teresa Krueger is a 82 y.o. female presents with 24 hours of midsternal chest discomfort and belching. Patient states his symptoms started last night when she was eating steak and felt that she had some food gets stuck in her throat. She was able to tolerate liquids but felt like she could not pass any solids at that time. This discomfort lasted roughly an hour. States the pain radiated through to her back. She tried taking Mylanta without any improvement. She then took extra strength gas ex and she was able to belch several times and had significant improvement in her symptoms. States that she's had issues similar to this previously with a history of Barrett's esophagus. Denies any retching or vomiting. Since then she states that she's had some mid chest discomfort without any shortness of breath. Denies any discomfort radiating to her shoulders or arms. Denies any numbness or tingling. Denies any neck pain or visual disturbance. Denies any recent fevers. A history of high blood pressure and has a pacemaker in place. Denies any palpitations. She states that she's been chest pain-free while in the ER.   Past Medical History:  Diagnosis Date  . Arthritis   . Asthma   . Barrett's esophagus   . Breast cancer ([place]) 1999   left breast  . Breast cancer, left ([place])   . Bronchiectasis ([place])   . COPD (chronic obstructive pulmonary disease) ([place])   . Diabetes mellitus ([place])   . Hypercholesterolemia   . Hypertension   . Lymphedema   . Mini stroke ([place])   . Osteoporosis   . Pacemaker     Patient Active Problem List   Diagnosis Date Noted  . Morbid obesity ([place]) 03/17/2015  . Hypersomnia with sleep apnea 03/17/2015  . Mobitz type 2  second degree AV block 03/16/2015  . Multiple pulmonary nodules 09/13/2014  . Chest pain 10/21/2013  . Bronchiectasis ([place]) 05/19/2012  . Chronic bronchitis 12/31/2011  . Cough 12/31/2011  . GERD (gastroesophageal reflux disease) 12/31/2011  . Diabetes mellitus ([place])   . Hypertension     Past Surgical History:  Procedure Laterality Date  . BREAST LUMPECTOMY  1999   left  . CARPAL TUNNEL RELEASE     right  . CATARACT EXTRACTION     bilateral  . GALLBLADDER SURGERY    . MASTECTOMY  1999   left  . PACEMAKER INSERTION Right 03/21/2015   Procedure: INSERTION PACEMAKER;  Surgeon: Isaias Cowman, MD;  Location: [facility] ORS;  Service: Cardiovascular;  Laterality: Right;  . TOTAL HIP ARTHROPLASTY  2001   left  . TRIGGER FINGER RELEASE     bilateral    Prior to Admission medications   Medication Sig Start Date End Date Taking? Authorizing Provider  acetaminophen (TYLENOL) 500 MG tablet Take 500 mg by mouth 3 (three) times daily.    Historical Provider, MD  albuterol (PROVENTIL HFA;VENTOLIN HFA) 108 (90 BASE) MCG/ACT inhaler Inhale 2 puffs into the lungs every 6 (six) hours as needed for wheezing or shortness of breath.    Historical Provider, MD  ALPRAZolam Duanne Moron) 0.5 MG tablet Take 1 tablet (0.5 mg total) by mouth 3 (three) times daily as needed for anxiety. 03/22/15   Tama High  III, MD  conjugated estrogens (PREMARIN) vaginal cream Place 1 Applicatorful vaginally daily. Apply 0.5mg  (pea-sized amount)  just inside the vaginal introitus with a finger-tip every night for two weeks and then Monday, Wednesday and Friday nights. 12/27/15   Larene [place] A McGowan, PA-C  fluticasone (FLONASE) 50 MCG/ACT nasal spray Place 2 sprays into both nostrils daily. Patient taking differently: Place 2 sprays into both nostrils daily as needed for rhinitis.  03/04/14 08/13/15  Juanito Doom, MD  insulin lispro protamine-lispro (HUMALOG 75/25 MIX) (75-25) 100 UNIT/ML SUSP injection Inject 15 Units into the  skin 2 (two) times daily. Patient taking differently: Inject 10-12 Units into the skin 2 (two) times daily.  03/22/15   Adin Hector, MD  KLOR-CON M20 20 MEQ tablet Reported on 09/21/2015 06/25/15   Historical Provider, MD  lidocaine (LIDODERM) 5 % Place 2 patches onto the skin daily. Remove & Discard patch within 12 hours or as directed by MD    Historical Provider, MD  losartan (COZAAR) 25 MG tablet Take 25 mg by mouth daily.    Historical Provider, MD  mometasone (ELOCON) 0.1 % lotion Reported on 12/05/2015 11/08/15   Historical Provider, MD  Multiple Vitamin (MULTIVITAMIN WITH MINERALS) TABS tablet Take 1 tablet by mouth daily.    Historical Provider, MD  mupirocin ointment (BACTROBAN) 2 % Reported on 12/05/2015 11/08/15   Historical Provider, MD  omeprazole (PRILOSEC) 20 MG capsule Take 1 capsule (20 mg total) by mouth 2 (two) times daily. 03/21/14   Juanito Doom, MD  Respiratory Therapy Supplies (FLUTTER) DEVI Use as directed 02/12/16   Flora Lipps, MD  simvastatin (ZOCOR) 20 MG tablet Take 20 mg by mouth daily.    Historical Provider, MD  spironolactone (ALDACTONE) 25 MG tablet Take 25 mg by mouth every other day.     Historical Provider, MD  sucralfate (CARAFATE) 1 G tablet Take 1 g by mouth 4 (four) times daily -  with meals and at bedtime.     Historical Provider, MD  torsemide (DEMADEX) 5 MG tablet Take 1 tablet (5 mg total) by mouth daily. Patient taking differently: Take 5 mg by mouth every other day.  03/22/15   Tama High III, MD  traMADol (ULTRAM) 50 MG tablet Take by mouth. 10/26/15   Historical Provider, MD    Allergies Aspirin; Celebrex [celecoxib]; Meloxicam; Nsaids; Rofecoxib; Tolmetin; Bactrim [sulfamethoxazole-trimethoprim]; Biguanide copolymer; Buprenorphine hcl; Ciprofloxacin; Codeine; Delsym [dextromethorphan polistirex er]; Dextromethorphan hbr; Furosemide; Hydrocodone; Metformin; Morphine and related; Penicillins; Pregabalin; Promethazine; Propoxyphene; Quinolones;  Seroquel [quetiapine fumarate]; Serotonin reuptake inhibitors (ssris); Sulfa antibiotics; Tramadol; and Ultracet [tramadol-acetaminophen]  Family History  Problem Relation Age of Onset  . Heart disease Paternal Grandmother   . Diabetes Paternal Grandmother   . Heart disease Father   . Heart disease Mother   . Heart disease Maternal Grandmother   . Stroke Maternal Aunt   . Breast cancer Paternal Aunt 94  . Lung cancer Brother 26  . Kidney disease Cousin     Social History Social History  Substance Use Topics  . Smoking status: Former Smoker    Packs/day: 1.00    Years: 30.00    Types: Cigarettes    Quit date: 12/27/1992  . Smokeless tobacco: Never Used  . Alcohol use No    Review of Systems Patient denies headaches, rhinorrhea, blurry vision, numbness, shortness of breath, chest pain, edema, cough, abdominal pain, nausea, vomiting, diarrhea, dysuria, fevers, rashes or hallucinations unless otherwise stated above in HPI. ____________________________________________  PHYSICAL EXAM:  VITAL SIGNS: Vitals:   02/13/16 1613  BP: (!) 182/48  Pulse: 76  Resp: 16  Temp: 98.2 F (36.8 C)    Constitutional: Alert and oriented. Well appearing and in no acute distress. Eyes: Conjunctivae are normal. PERRL. EOMI. Head: Atraumatic. Nose: No congestion/rhinnorhea. Mouth/Throat: Mucous membranes are moist.  Oropharynx non-erythematous. Neck: No stridor. Painless ROM. No cervical spine tenderness to palpation Hematological/Lymphatic/Immunilogical: No cervical lymphadenopathy. Cardiovascular: Normal rate, regular rhythm. Holosystolic murmur.  Good peripheral circulation.  Equal 2+ pulses in all four extremities Respiratory: Normal respiratory effort.  No retractions. Lungs CTAB. Gastrointestinal: Soft and nontender. No distention. No abdominal bruits. No CVA tenderness. Genitourinary:  Musculoskeletal: No lower extremity tenderness,  1+ lymphedema.  No joint effusions. Neurologic:   Normal speech and language. No gross focal neurologic deficits are appreciated. No gait instability. Skin:  Skin is warm, dry and intact. No rash noted. Psychiatric: Mood and affect are normal. Speech and behavior are normal.  ____________________________________________   LABS (all labs ordered are listed, but only abnormal results are displayed)  Results for orders placed or performed during the hospital encounter of 02/13/16 (from the past 24 hour(s))  Basic metabolic panel     Status: Abnormal   Collection Time: 02/13/16  4:17 PM  Result Value Ref Range   Sodium 137 135 - 145 mmol/L   Potassium 5.0 3.5 - 5.1 mmol/L   Chloride 103 101 - 111 mmol/L   CO2 26 22 - 32 mmol/L   Glucose, Bld 124 (H) 65 - 99 mg/dL   BUN 31 (H) 6 - 20 mg/dL   Creatinine, Ser 1.13 (H) 0.44 - 1.00 mg/dL   Calcium 9.3 8.9 - 10.3 mg/dL   GFR calc non Af Amer 45 (L) >60 mL/min   GFR calc Af Amer 52 (L) >60 mL/min   Anion gap 8 5 - 15  CBC     Status: None   Collection Time: 02/13/16  4:17 PM  Result Value Ref Range   WBC 4.7 3.6 - 11.0 K/uL   RBC 4.32 3.80 - 5.20 MIL/uL   Hemoglobin 12.8 12.0 - 16.0 g/dL   HCT 37.4 35.0 - 47.0 %   MCV 86.6 80.0 - 100.0 fL   MCH 29.6 26.0 - 34.0 pg   MCHC 34.2 32.0 - 36.0 g/dL   RDW 13.4 11.5 - 14.5 %   Platelets 181 150 - 440 K/uL  Troponin I     Status: Abnormal   Collection Time: 02/13/16  4:17 PM  Result Value Ref Range   Troponin I 0.04 (HH) <0.03 ng/mL   ____________________________________________  EKG My review and personal interpretation at Time: 16:11   Indication: chest pain  Rate: 70  Rhythm: sinus Axis: left Other: rbbb, non specific ST changes, no acute elevations ____________________________________________  RADIOLOGY  CXR Chronic bronchitic changes without evidence of pneumonia.  No CHF.  Aortic atherosclerosis.  Correlation clinically as to the adequacy of the positioning of the atrial lead of the permanent pacemaker is  needed. ____________________________________________   PROCEDURES  Procedure(s) performed: none    Critical Care performed: no ____________________________________________   INITIAL IMPRESSION / ASSESSMENT AND PLAN / ED COURSE  Pertinent labs & imaging results that were available during my care of the patient were reviewed by me and considered in my medical decision making (see chart for details).  DDX: acs, pna, dissection, gastritis, esophagitis, IBS,   Teresa Krueger is a 82 y.o. who presents to the ED with Chief  complaint of mid sternal chest discomfort and epigastric discomfort after eating a piece of steak last night. Patient has had a significant cardiac history able to fill his presentation is less consistent with ACS. EKG ordered in triage is consistent with previous. Initial troponin also negative. She has no hypoxia and is without fever. Do not feel this is clinically consistent with pneumonia. Patient denies any pain radiating through to her back or shoulders and has equal 2+ pulses in bilateral upper extremities. Based on his clinical scenario I do not feel this clinically consistent with dissection and given her chronic kidney disease feel that the risks associated with CT imaging with contrast far outweigh the likelihood of dissection in this clinical presentation. Patient denies any heavy retching to suggest mediastinitis or Boerhaave's. Patient does have extensive history of IBS and belching. Given the patient's improvement with symptoms after taking Gas-X for this could likely be the etiology of her symptoms. Regardless, patient is at increased risk for cardiac symptoms before I will further evaluate with a repeat EKG and troponin. We'll also have her pacer interrogated. Patient is currently pain-free and has no other complaints.  Clinical Course  Comment By Time  Received call from [employer]. Review of pacemaker interrogation shows normal impotence and stable threshold  for lead wires. No events. Merlyn Lot, MD 08/22 2009  Repeat EKG with atrially sensed rhythm.  No sgarbossa criteria Merlyn Lot, MD 08/22 2046  Repeat troponin negative. Based on her history and presentation I do not feel is consistent with ACS. Patient has follow-up with cardiology in 2 days. Patient was able to tolerate PO and was without hypoxia or complaint.  Have discussed with the patient and available family all diagnostics and treatments performed thus far and all questions were answered to the best of my ability. The patient demonstrates understanding and agreement with plan.  Merlyn Lot, MD 08/22 2122     ____________________________________________   FINAL CLINICAL IMPRESSION(S) / ED DIAGNOSES  Final diagnoses:  Atypical chest pain  Flatulence/gas pain/belching      NEW MEDICATIONS STARTED DURING THIS VISIT:  New Prescriptions   No medications on file     Note:  This document was prepared using Dragon voice recognition software and may include unintentional dictation errors.     Merlyn Lot, MD 02/13/16 [phone]

## 2016-02-13 NOTE — Telephone Encounter (Signed)
Pt calling stating yesterday before she came she ate She started to get some pains in her chest and went back to her back Mentioned this to the provider  Went home  Pain started to get worst Had lots of gas, kept burping Took some Gas X Was wondering while her check up with [place] did we see anything wrong.  She called her PCP and she will be seeing them tomorrow  She is still having this pain.  Please advise

## 2016-02-27 ENCOUNTER — Ambulatory Visit: Payer: Medicare Other | Admitting: Podiatry

## 2016-03-05 ENCOUNTER — Encounter: Payer: Self-pay | Admitting: Podiatry

## 2016-03-05 ENCOUNTER — Ambulatory Visit (INDEPENDENT_AMBULATORY_CARE_PROVIDER_SITE_OTHER): Payer: Medicare Other | Admitting: Podiatry

## 2016-03-05 VITALS — BP 137/61 | Resp 16

## 2016-03-05 DIAGNOSIS — E1140 Type 2 diabetes mellitus with diabetic neuropathy, unspecified: Secondary | ICD-10-CM | POA: Diagnosis not present

## 2016-03-05 DIAGNOSIS — Z794 Long term (current) use of insulin: Secondary | ICD-10-CM

## 2016-03-05 DIAGNOSIS — Q828 Other specified congenital malformations of skin: Secondary | ICD-10-CM | POA: Diagnosis not present

## 2016-03-05 DIAGNOSIS — B351 Tinea unguium: Secondary | ICD-10-CM

## 2016-03-05 DIAGNOSIS — M79671 Pain in right foot: Secondary | ICD-10-CM

## 2016-03-05 DIAGNOSIS — M79676 Pain in unspecified toe(s): Secondary | ICD-10-CM | POA: Diagnosis not present

## 2016-03-05 NOTE — Progress Notes (Signed)
SUBJECTIVE Patient with a history of diabetes mellitus presents to office today complaining of elongated, thickened nails. Pain while ambulating in shoes. Patient is unable to trim their own nails. Patient also has painful callus formations to the bilateral feet. Patient is wheelchair-bound   OBJECTIVE General Patient is awake, alert, and oriented x 3 and in no acute distress. Derm Skin is dry and supple bilateral. Negative open lesions or macerations. Remaining integument unremarkable. Nails are tender, long, thickened and dystrophic with subungual debris, consistent with onychomycosis, 1-5 bilateral. No signs of infection noted. Vasc  DP and PT pedal pulses palpable bilaterally. Temperature gradient within normal limits.  Neuro Epicritic and protective threshold sensation diminished bilaterally.  Musculoskeletal Exam No symptomatic pedal deformities noted bilateral. Muscular strength within normal limits.  ASSESSMENT 1. Diabetes Mellitus w/ peripheral neuropathy 2. Onychomycosis of nail due to dermatophyte bilateral 3. Pain in foot bilateral 4. Callus formation bilateral medial heels. #5 callus formation fifth MPJ right foot #6 pain on palpation to bilateral heels and fifth MPJ right foot  PLAN OF CARE Patient evaluated today. Instructed to maintain good pedal hygiene and foot care. Stressed importance of controlling blood sugar.  Mechanical debridement of nails 1-5 bilaterally performed using a nail nipper. Filed with dremel without incident.  Excisional debridement of all calluses previously noted were performed using a chisel blade All patient questions were answered. Return to clinic in 3 mos.    Edrick Kins, DPM

## 2016-03-05 NOTE — Patient Instructions (Signed)
Diabetes and Foot Care Diabetes may cause you to have problems because of poor blood supply (circulation) to your feet and legs. This may cause the skin on your feet to become thinner, break easier, and heal more slowly. Your skin may become dry, and the skin may peel and crack. You may also have nerve damage in your legs and feet causing decreased feeling in them. You may not notice minor injuries to your feet that could lead to infections or more serious problems. Taking care of your feet is one of the most important things you can do for yourself.  HOME CARE INSTRUCTIONS  Wear shoes at all times, even in the house. Do not go barefoot. Bare feet are easily injured.  Check your feet daily for blisters, cuts, and redness. If you cannot see the bottom of your feet, use a mirror or ask someone for help.  Wash your feet with warm water (do not use hot water) and mild soap. Then pat your feet and the areas between your toes until they are completely dry. Do not soak your feet as this can dry your skin.  Apply a moisturizing lotion or petroleum jelly (that does not contain alcohol and is unscented) to the skin on your feet and to dry, brittle toenails. Do not apply lotion between your toes.  Trim your toenails straight across. Do not dig under them or around the cuticle. File the edges of your nails with an emery board or nail file.  Do not cut corns or calluses or try to remove them with medicine.  Wear clean socks or stockings every day. Make sure they are not too tight. Do not wear knee-high stockings since they may decrease blood flow to your legs.  Wear shoes that fit properly and have enough cushioning. To break in new shoes, wear them for just a few hours a day. This prevents you from injuring your feet. Always look in your shoes before you put them on to be sure there are no objects inside.  Do not cross your legs. This may decrease the blood flow to your feet.  If you find a minor scrape,  cut, or break in the skin on your feet, keep it and the skin around it clean and dry. These areas may be cleansed with mild soap and water. Do not cleanse the area with peroxide, alcohol, or iodine.  When you remove an adhesive bandage, be sure not to damage the skin around it.  If you have a wound, look at it several times a day to make sure it is healing.  Do not use heating pads or hot water bottles. They may burn your skin. If you have lost feeling in your feet or legs, you may not know it is happening until it is too late.  Make sure your health care provider performs a complete foot exam at least annually or more often if you have foot problems. Report any cuts, sores, or bruises to your health care provider immediately. SEEK MEDICAL CARE IF:   You have an injury that is not healing.  You have cuts or breaks in the skin.  You have an ingrown nail.  You notice redness on your legs or feet.  You feel burning or tingling in your legs or feet.  You have pain or cramps in your legs and feet.  Your legs or feet are numb.  Your feet always feel cold. SEEK IMMEDIATE MEDICAL CARE IF:   There is increasing redness,   swelling, or pain in or around a wound.  There is a red line that goes up your leg.  Pus is coming from a wound.  You develop a fever or as directed by your health care provider.  You notice a bad smell coming from an ulcer or wound.   This information is not intended to replace advice given to you by your health care provider. Make sure you discuss any questions you have with your health care provider.   Document Released: 06/07/2000 Document Revised: 02/10/2013 Document Reviewed: 11/17/2012 [employer] Interactive Patient Education 2016 [employer]  

## 2016-03-19 ENCOUNTER — Encounter: Payer: Self-pay | Admitting: Gastroenterology

## 2016-03-19 ENCOUNTER — Other Ambulatory Visit: Payer: Self-pay

## 2016-03-19 ENCOUNTER — Ambulatory Visit (INDEPENDENT_AMBULATORY_CARE_PROVIDER_SITE_OTHER): Payer: Medicare Other | Admitting: Gastroenterology

## 2016-03-19 VITALS — BP 155/70 | HR 80 | Temp 98.0°F | Ht 61.0 in | Wt 266.0 lb

## 2016-03-19 DIAGNOSIS — M810 Age-related osteoporosis without current pathological fracture: Secondary | ICD-10-CM | POA: Insufficient documentation

## 2016-03-19 DIAGNOSIS — M1990 Unspecified osteoarthritis, unspecified site: Secondary | ICD-10-CM | POA: Insufficient documentation

## 2016-03-19 DIAGNOSIS — R194 Change in bowel habit: Secondary | ICD-10-CM | POA: Diagnosis not present

## 2016-03-19 NOTE — Progress Notes (Signed)
Gastroenterology Consultation  Referring Provider:     Adin Hector, MD Primary Care Physician:  Adin Hector, MD Primary Gastroenterologist:  Dr. Allen Norris     Reason for Consultation:     Change in bowel habits        HPI:   Teresa Krueger is a 82 y.o. y/o female referred for consultation & management of Change in bowel habits by Dr. Tama High III, MD.  This patient comes in today after seeing Dr. Vira Agar for many years.  She states she has seen him since he came to town.  The patient now reports that after being constipated for her whole life she started to have a large amount of diarrhea.  She states that the diarrhea was so bad that she had soiled herself multiple times and was afraid to go out.  The patient also reports that she has a history of multiple adenomatous polyps in the past.  The patient also has some upper abdominal pain  Which she is concerned about.  There is no report of any unexplained weight loss black stools or bloody stools.  She  Also reports that the diarrhea has subsided but now she has a very hard stool that is usually very difficult to pass and then when she passes the hard stool a lot of soft but not watery stool comes out.  She tried to get in touch with Dr. Vira Agar but he  Michela Pitcher he would not see her and sent her to his nurse.  The patient refused to go to the nurse so she came to see me.  The patient has been in a wheelchair  Chronically.  She also reports that she was told she should not have any further colonoscopies because of her age.  Past Medical History:  Diagnosis Date  . Arthritis   . Asthma   . Barrett's esophagus   . Breast cancer ([place]) 1999   left breast  . Breast cancer, left ([place])   . Bronchiectasis ([place])   . COPD (chronic obstructive pulmonary disease) ([place])   . Diabetes mellitus ([place])   . Hypercholesterolemia   . Hypertension   . Lymphedema   . Mini stroke ([place])   . Osteoporosis   . Pacemaker     Past Surgical History:    Procedure Laterality Date  . BREAST LUMPECTOMY  1999   left  . CARPAL TUNNEL RELEASE     right  . CATARACT EXTRACTION     bilateral  . GALLBLADDER SURGERY    . MASTECTOMY  1999   left  . PACEMAKER INSERTION Right 03/21/2015   Procedure: INSERTION PACEMAKER;  Surgeon: Isaias Cowman, MD;  Location: [facility] ORS;  Service: Cardiovascular;  Laterality: Right;  . TOTAL HIP ARTHROPLASTY  2001   left  . TRIGGER FINGER RELEASE     bilateral    Prior to Admission medications   Medication Sig Start Date End Date Taking? Authorizing Provider  acetaminophen (TYLENOL) 500 MG tablet Take 500 mg by mouth 3 (three) times daily.   Yes Historical Provider, MD  albuterol (PROVENTIL HFA;VENTOLIN HFA) 108 (90 BASE) MCG/ACT inhaler Inhale 2 puffs into the lungs every 6 (six) hours as needed for wheezing or shortness of breath.   Yes Historical Provider, MD  ALPRAZolam Duanne Moron) 0.5 MG tablet Take 1 tablet (0.5 mg total) by mouth 3 (three) times daily as needed for anxiety. 03/22/15  Yes Adin Hector, MD  conjugated estrogens (PREMARIN) vaginal  cream Place 1 Applicatorful vaginally daily. Apply 0.5mg  (pea-sized amount)  just inside the vaginal introitus with a finger-tip every night for two weeks and then Monday, Wednesday and Friday nights. 12/27/15  Yes Shannon A McGowan, PA-C  insulin lispro protamine-lispro (HUMALOG 75/25 MIX) (75-25) 100 UNIT/ML SUSP injection Inject 15 Units into the skin 2 (two) times daily. Patient taking differently: Inject 10-12 Units into the skin 2 (two) times daily.  03/22/15  Yes Adin Hector, MD  KLOR-CON M20 20 MEQ tablet Reported on 09/21/2015 06/25/15  Yes Historical Provider, MD  lidocaine (LIDODERM) 5 % Place 2 patches onto the skin daily. Remove & Discard patch within 12 hours or as directed by MD   Yes Historical Provider, MD  losartan (COZAAR) 25 MG tablet Take 25 mg by mouth daily.   Yes Historical Provider, MD  mometasone (ELOCON) 0.1 % lotion Reported on 12/05/2015  11/08/15  Yes Historical Provider, MD  Multiple Vitamin (MULTIVITAMIN WITH MINERALS) TABS tablet Take 1 tablet by mouth daily.   Yes Historical Provider, MD  mupirocin ointment (BACTROBAN) 2 % Reported on 12/05/2015 11/08/15  Yes Historical Provider, MD  omeprazole (PRILOSEC) 20 MG capsule Take 1 capsule (20 mg total) by mouth 2 (two) times daily. 03/21/14  Yes Juanito Doom, MD  Respiratory Therapy Supplies (FLUTTER) DEVI Use as directed 02/12/16  Yes Flora Lipps, MD  simvastatin (ZOCOR) 20 MG tablet Take 20 mg by mouth daily.   Yes Historical Provider, MD  spironolactone (ALDACTONE) 25 MG tablet Take 25 mg by mouth every other day.    Yes Historical Provider, MD  sucralfate (CARAFATE) 1 G tablet Take 1 g by mouth 4 (four) times daily -  with meals and at bedtime.    Yes Historical Provider, MD  torsemide (DEMADEX) 5 MG tablet Take 1 tablet (5 mg total) by mouth daily. Patient taking differently: Take 5 mg by mouth every other day.  03/22/15  Yes Tama High III, MD  traMADol (ULTRAM) 50 MG tablet Take by mouth. 10/26/15  Yes Historical Provider, MD  fluticasone (FLONASE) 50 MCG/ACT nasal spray Place 2 sprays into both nostrils daily. Patient taking differently: Place 2 sprays into both nostrils daily as needed for rhinitis.  03/04/14 08/13/15  Juanito Doom, MD    Family History  Problem Relation Age of Onset  . Heart disease Paternal Grandmother   . Diabetes Paternal Grandmother   . Heart disease Father   . Heart disease Mother   . Heart disease Maternal Grandmother   . Stroke Maternal Aunt   . Breast cancer Paternal Aunt 94  . Lung cancer Brother 2  . Kidney disease Cousin      Social History  Substance Use Topics  . Smoking status: Former Smoker    Packs/day: 1.00    Years: 30.00    Types: Cigarettes    Quit date: 12/27/1992  . Smokeless tobacco: Never Used  . Alcohol use No    Allergies as of 03/19/2016 - Review Complete 03/19/2016  Allergen Reaction Noted  . Aspirin  Swelling and Other (See Comments) 02/17/2015  . Celebrex [celecoxib] Anaphylaxis 12/31/2011  . Meloxicam Swelling 02/17/2015  . Nsaids Anaphylaxis 12/31/2011  . Rofecoxib Anaphylaxis 12/31/2011  . Tolmetin Anaphylaxis 12/06/2014  . Bactrim [sulfamethoxazole-trimethoprim] Hives and Itching 02/17/2015  . Biguanide copolymer Other (See Comments) 03/16/2015  . Buprenorphine hcl Other (See Comments) 02/17/2015  . Ciprofloxacin Other (See Comments) 02/17/2015  . Codeine Nausea And Vomiting and Other (See Comments) 12/31/2011  .  Delsym [dextromethorphan polistirex er] Other (See Comments) 06/30/2012  . Dextromethorphan hbr Other (See Comments) 02/17/2015  . Furosemide Other (See Comments) 02/17/2015  . Hydrocodone Other (See Comments) 12/31/2011  . Metformin Other (See Comments) 02/17/2015  . Morphine and related Other (See Comments) 12/31/2011  . Penicillins Swelling and Other (See Comments) 12/31/2011  . Pregabalin Other (See Comments) 02/17/2015  . Promethazine Other (See Comments) 03/16/2015  . Propoxyphene Other (See Comments) 12/31/2011  . Quinolones Other (See Comments) 12/31/2011  . Seroquel [quetiapine fumarate] Other (See Comments) 03/16/2015  . Serotonin reuptake inhibitors (ssris) Other (See Comments) 02/17/2015  . Sulfa antibiotics Hives and Itching 12/31/2011  . Tramadol Nausea Only 03/16/2015  . Ultracet [tramadol-acetaminophen] Other (See Comments) 03/16/2015    Review of Systems:    All systems reviewed and negative except where noted in HPI.   Physical Exam:  BP (!) 155/70   Pulse 80   Temp 98 F (36.7 C) (Oral)   Ht 5\' 1"  (1.549 m)   Wt 266 lb (120.7 kg)   BMI 50.26 kg/m  No LMP recorded. Patient is postmenopausal. Psych:  Alert and cooperative. Normal mood and affect. General:   Alert,  Well-developed, well-nourished, pleasant and cooperative in NAD Head:  Normocephalic and atraumatic. Eyes:  Sclera clear, no icterus.   Conjunctiva pink. Ears:  Normal  auditory acuity. Nose:  No deformity, discharge, or lesions. Mouth:  No deformity or lesions,oropharynx pink & moist. Neck:  Supple; no masses or thyromegaly. Lungs:  Respirations even and unlabored.  Clear throughout to auscultation.   No wheezes, crackles, or rhonchi. No acute distress. Heart:  Regular rate and rhythm; no murmurs, clicks, rubs, or gallops. Abdomen:  Normal bowel sounds.  No bruits.  Soft, non-tender and non-distended without masses, hepatosplenomegaly or hernias noted.  No guarding or rebound tenderness.  Negative Carnett sign.   Rectal:  Deferred.  Msk:  Symmetrical without gross deformities.  Patient is wheelchair bound Pulses:  Normal pulses noted. Extremities:  No clubbing or edema.  No cyanosis. Neurologic:  Alert and oriented x3;  Moves around an electric wheelchair Skin:  Intact without significant lesions or rashes.  No jaundice. Lymph Nodes:  No significant cervical adenopathy. Psych:  Alert and cooperative. Normal mood and affect.  Imaging Studies: No results found.  Assessment and Plan:   NAJA RUGE is a 82 y.o. y/o female Who has a history of multiple colon polyps throughout the years.  The patient now has had a significant change in bowel habits with her constipation turning to profuse diarrhea which has now turned to soft stools.  The patient also has some upper abdominal pain despite not having any early satiety or nausea and vomiting.  The patient  has been told to increase fiber in her diet daily to see if this helps her stools.  She has also been told that her symptoms are consistent with microscopic colitis and she may need a colonoscopy if the fiber does not work or if her symptoms return.  The patient states that if she is getting go through a colonoscopy she would also like to undergo a upper endoscopy because of her abdominal pain in the upper abdomen.  Contact our office if she has the diarrhea again.  The patient has been explained the plan and  agrees with it.   Note: This dictation was prepared with Dragon dictation along with smaller phrase technology. Any transcriptional errors that result from this process are unintentional.

## 2016-03-21 ENCOUNTER — Telehealth: Payer: Self-pay | Admitting: Gastroenterology

## 2016-03-21 NOTE — Telephone Encounter (Signed)
Patient of Dr Allen Norris. She saw him on Tuesday. She has a question about taking Citrucel. Please call and advise.

## 2016-03-22 NOTE — Telephone Encounter (Signed)
Discussed with pt how when to take her Citrucel. Pt will do over the weekend, see how things go and call back if she decides she wants to have a colonoscopy.

## 2016-03-27 ENCOUNTER — Telehealth: Payer: Self-pay | Admitting: Internal Medicine

## 2016-03-27 NOTE — Telephone Encounter (Signed)
Please call patient due to Medicare won't pay for oxygen due to the diagnosis that was sent in. Please call Teresa Krueger. [place]

## 2016-03-28 ENCOUNTER — Ambulatory Visit (INDEPENDENT_AMBULATORY_CARE_PROVIDER_SITE_OTHER): Payer: Medicare Other | Admitting: Urology

## 2016-03-28 ENCOUNTER — Telehealth: Payer: Self-pay | Admitting: Internal Medicine

## 2016-03-28 ENCOUNTER — Encounter: Payer: Self-pay | Admitting: Urology

## 2016-03-28 ENCOUNTER — Telehealth: Payer: Self-pay | Admitting: Urology

## 2016-03-28 ENCOUNTER — Ambulatory Visit: Payer: Medicare Other | Admitting: Urology

## 2016-03-28 VITALS — BP 156/66 | HR 74 | Ht 60.0 in | Wt 267.0 lb

## 2016-03-28 DIAGNOSIS — R39198 Other difficulties with micturition: Secondary | ICD-10-CM

## 2016-03-28 DIAGNOSIS — N183 Chronic kidney disease, stage 3 unspecified: Secondary | ICD-10-CM

## 2016-03-28 DIAGNOSIS — N952 Postmenopausal atrophic vaginitis: Secondary | ICD-10-CM

## 2016-03-28 LAB — BLADDER SCAN AMB NON-IMAGING: Scan Result: 11

## 2016-03-28 NOTE — Telephone Encounter (Signed)
It is not listed in her current past medical history.  How long has it been since she had breast cancer?

## 2016-03-28 NOTE — Telephone Encounter (Signed)
Patient brought in forms from [facility] for required documentation she needs for insurance to pay for oxygen.  Forms placed in [facility] box.  Please call patient to discuss.

## 2016-03-28 NOTE — Progress Notes (Signed)
10:06 PM   Teresa Krueger [birth year] [id number]  Referring provider: Adin Hector, MD [facility] Krueger, Teresa [id number]  Chief Complaint  Patient presents with  . Urinary Retention    3 month follow up  . Vaginal Atrophy    HPI: Patient is an 82 year old Caucasian female with difficulty with urination, vaginal atrophy and stage III kidney disease who presents today for a 3 month follow-up.  Difficulty with urination Patient's PVR today's visit was 11 mL.  She is experiencing nocturia and a weak urinary stream.  She is somewhat bothered by waking up at night because she had to urinate due to her diuretics. She is bothered a little bit with the nighttime urination and in a comfortable urge to urinate. She is not bothered the sudden urge to urinate, accidental loss of small [name] urine or her urine loss associated with a strong desire to urinate.  Vaginal atrophy Patient has been applying a blueberry sized amount of cream to her vaginal mucosa 3 nights weekly.  She states she feels better.  She is not experiencing a vaginal rash or vaginal burning with the cream.  Chronic kidney disease Her renal ultrasound completed on 12/21/2015 noted a mildly prominent right renal pelvis without significant hydronephrosis and the left kidney and bladder which were normal in appearance. The prominent right renal pelvis is most likely congenital and noncontributory to her urinary symptoms or her chronic kidney disease.  Her most recent serum creatinine was 1.13 on 02/13/2016.  PMH: Past Medical History:  Diagnosis Date  . Arthritis   . Asthma   . Barrett's esophagus   . Breast cancer ([place]) 1999   left breast  . Breast cancer, left ([place])   . Bronchiectasis ([place])   . COPD (chronic obstructive pulmonary disease) ([place])   . Diabetes mellitus ([place])   . Hypercholesterolemia   . Hypertension   . Lymphedema   . Mini stroke ([place])   . Osteoporosis   .  Pacemaker     Surgical History: Past Surgical History:  Procedure Laterality Date  . BREAST LUMPECTOMY  1999   left  . CARPAL TUNNEL RELEASE     right  . CATARACT EXTRACTION     bilateral  . GALLBLADDER SURGERY    . MASTECTOMY  1999   left  . PACEMAKER INSERTION Right 03/21/2015   Procedure: INSERTION PACEMAKER;  Surgeon: Isaias Cowman, MD;  Location: [facility] ORS;  Service: Cardiovascular;  Laterality: Right;  . TOTAL HIP ARTHROPLASTY  2001   left  . TRIGGER FINGER RELEASE     bilateral    Home Medications:    Medication List       Accurate as of 03/28/16 11:59 PM. Always use your most recent med list.          acetaminophen 500 MG tablet Commonly known as:  TYLENOL Take 500 mg by mouth 3 (three) times daily.   albuterol 108 (90 Base) MCG/ACT inhaler Commonly known as:  PROVENTIL HFA;VENTOLIN HFA Inhale 2 puffs into the lungs every 6 (six) hours as needed for wheezing or shortness of breath.   ALPRAZolam 0.5 MG tablet Commonly known as:  XANAX Take 1 tablet (0.5 mg total) by mouth 3 (three) times daily as needed for anxiety.   conjugated estrogens vaginal cream Commonly known as:  PREMARIN Place 1 Applicatorful vaginally daily. Apply 0.5mg  (pea-sized amount)  just inside the vaginal introitus with a finger-tip every night for two weeks  and then Monday, Wednesday and Friday nights.   fluticasone 50 MCG/ACT nasal spray Commonly known as:  FLONASE Place 2 sprays into both nostrils daily.   FLUTTER Devi Use as directed   insulin lispro protamine-lispro (75-25) 100 UNIT/ML Susp injection Commonly known as:  HUMALOG 75/25 MIX Inject 15 Units into the skin 2 (two) times daily.   KLOR-CON M20 20 MEQ tablet Generic drug:  potassium chloride SA Reported on 09/21/2015   lidocaine 5 % Commonly known as:  LIDODERM Place 2 patches onto the skin daily. Remove & Discard patch within 12 hours or as directed by MD   losartan 25 MG tablet Commonly known as:   COZAAR Take 25 mg by mouth daily.   mometasone 0.1 % lotion Commonly known as:  ELOCON Reported on 12/05/2015   multivitamin with minerals Tabs tablet Take 1 tablet by mouth daily.   mupirocin ointment 2 % Commonly known as:  BACTROBAN Reported on 12/05/2015   omeprazole 20 MG capsule Commonly known as:  PRILOSEC Take 1 capsule (20 mg total) by mouth 2 (two) times daily.   simvastatin 20 MG tablet Commonly known as:  ZOCOR Take 20 mg by mouth daily.   spironolactone 25 MG tablet Commonly known as:  ALDACTONE Take 25 mg by mouth every other day.   sucralfate 1 g tablet Commonly known as:  CARAFATE Take 1 g by mouth 4 (four) times daily -  with meals and at bedtime.   torsemide 5 MG tablet Commonly known as:  DEMADEX Take 1 tablet (5 mg total) by mouth daily.   traMADol 50 MG tablet Commonly known as:  ULTRAM Take by mouth.       Allergies:  Allergies  Allergen Reactions  . Aspirin Swelling and Other (See Comments)    Pt states that she has tongue swelling.    . Celebrex [Celecoxib] Anaphylaxis  . Meloxicam Swelling  . Nsaids Anaphylaxis  . Rofecoxib Anaphylaxis  . Tolmetin Anaphylaxis  . Bactrim [Sulfamethoxazole-Trimethoprim] Hives and Itching  . Biguanide Copolymer Other (See Comments)    Reaction:  Unknown   . Buprenorphine Hcl Other (See Comments)    Reaction:  Unknown   . Ciprofloxacin Other (See Comments)    Reaction:  Unknown   . Codeine Nausea And Vomiting and Other (See Comments)    Reaction:  Syncope   . Delsym [Dextromethorphan Polistirex Er] Other (See Comments)    Reaction:  GI upset   . Dextromethorphan Hbr Other (See Comments)    Reaction:  GI upset   . Furosemide Other (See Comments)    Reaction:  Unknown   . Hydrocodone Other (See Comments)    Reaction:  Unknown   . Metformin Other (See Comments)    Reaction:  Unknown   . Morphine And Related Other (See Comments)    Reaction:  Unknown   . Penicillins Swelling and Other (See  Comments)    Pt did not answer the additional questions for this medication because she does not remember.    . Pregabalin Other (See Comments)    Reaction:  Unknown   . Promethazine Other (See Comments)    Reaction:  Unknown   . Propoxyphene Other (See Comments)    Reaction:  Unknown   . Quinolones Other (See Comments)    Reaction:  Unknown   . Seroquel [Quetiapine Fumarate] Other (See Comments)    Reaction:  Unknown   . Serotonin Reuptake Inhibitors (Ssris) Other (See Comments)    Reaction:  Unknown   .  Sulfa Antibiotics Hives and Itching  . Tramadol Nausea Only  . Ultracet [Tramadol-Acetaminophen] Other (See Comments)    Reaction:  Unknown     Family History: Family History  Problem Relation Age of Onset  . Heart disease Paternal Grandmother   . Diabetes Paternal Grandmother   . Heart disease Father   . Heart disease Mother   . Heart disease Maternal Grandmother   . Stroke Maternal Aunt   . Breast cancer Paternal Aunt 94  . Lung cancer Brother 51  . Kidney disease Cousin   . Bladder Cancer Neg Hx     Social History:  reports that she quit smoking about 23 years ago. Her smoking use included Cigarettes. She has a 30.00 pack-year smoking history. She has never used smokeless tobacco. She reports that she does not drink alcohol or use drugs.  ROS: UROLOGY Frequent Urination?: No Hard to postpone urination?: No Burning/pain with urination?: No Get up at night to urinate?: Yes Leakage of urine?: No Urine stream starts and stops?: Yes Trouble starting stream?: No Do you have to strain to urinate?: No Blood in urine?: No Urinary tract infection?: No Sexually transmitted disease?: No Injury to kidneys or bladder?: No Painful intercourse?: Yes Weak stream?: Yes Currently pregnant?: No Vaginal bleeding?: No Last menstrual period?: n  Gastrointestinal Nausea?: Yes Vomiting?: No Indigestion/heartburn?: Yes Diarrhea?: Yes Constipation?:  Yes  Constitutional Fever: No Night sweats?: Yes Weight loss?: No Fatigue?: No  Skin Skin rash/lesions?: No Itching?: Yes  Eyes Blurred vision?: No Double vision?: Yes  Ears/Nose/Throat Sore throat?: No Sinus problems?: Yes  Hematologic/Lymphatic Swollen glands?: No Easy bruising?: No  Cardiovascular Leg swelling?: Yes Chest pain?: No  Respiratory Cough?: Yes Shortness of breath?: Yes  Endocrine Excessive thirst?: No  Musculoskeletal Back pain?: Yes Joint pain?: Yes  Neurological Headaches?: No Dizziness?: No  Psychologic Depression?: No Anxiety?: Yes  Physical Exam: BP (!) 156/66   Pulse 74   Ht 5' (1.524 m)   Wt 267 lb (121.1 kg)   BMI 52.14 kg/m   Constitutional: Well nourished. Alert and oriented, No acute distress. HEENT:  AT, moist mucus membranes. Trachea midline, no masses. Cardiovascular: No clubbing, cyanosis, or edema. Respiratory: Normal respiratory effort, no increased work of breathing. GI: Abdomen is soft, non tender, non distended, no abdominal masses. Liver and spleen not palpable.  No hernias appreciated.  Stool sample for occult testing is not indicated.   GU: No CVA tenderness.  No bladder fullness or masses.  Atrophic external genitalia, normal pubic hair distribution, no lesions.  Normal urethral meatus, no lesions, no prolapse, no discharge.   Urethra could not be visualize due to body habitus.  Bladder, uterus, cervix and adnexal/parametria masses or tenderness could not be palpated due to body habitus.  Anus and perineum are without rashes or lesions.    Skin: No rashes, bruises or suspicious lesions. Lymph: No cervical or inguinal adenopathy. Neurologic: Grossly intact, no focal deficits, moving all 4 extremities. Psychiatric: Normal mood and affect.  Laboratory Data: Lab Results  Component Value Date   WBC 4.7 02/13/2016   HGB 12.8 02/13/2016   HCT 37.4 02/13/2016   MCV 86.6 02/13/2016   PLT 181 02/13/2016     Lab Results  Component Value Date   CREATININE 1.13 (H) 02/13/2016    Lab Results  Component Value Date   TSH 1.364 03/17/2015     Lab Results  Component Value Date   AST 28 03/20/2015   Lab Results  Component Value Date  ALT 20 03/20/2015     Pertinent imaging Results for orders placed or performed in visit on 03/28/16  BLADDER SCAN AMB NON-IMAGING  Result Value Ref Range   Scan Result 11      Assessment & Plan:    1. Difficulty with urination  - Patient feels that her difficulty with urination has improved with the use of vaginal estrogen cream  - Will follow-up in one year for a OAB questionnaire and PVR  2. Vaginal atrophy  - Patient will continue the vaginal estrogen cream 3 nights weekly.  - She will follow-up in one year for exam.  3. Stage III kidney disease  - Patient is followed by nephrology  - She will follow up with Dr. Candiss Norse in three months  Return in about 1 year (around 03/28/2017) for PVR, OAB and exam.  These notes generated with voice recognition software. I apologize for typographical errors.  Zara Council, [facility] [address] [place], [place] [address] [phone]

## 2016-03-28 NOTE — Telephone Encounter (Signed)
Will get forms from box at cardiology and see what is required.

## 2016-03-28 NOTE — Telephone Encounter (Signed)
Patient has been using premarin cream for the last 6 weeks and she has had breast cancer and she was told by her PCP that it was estrogen based and it might not be safe for her to use. She would like someone to call her back to discuss this.   Thanks,  michelle

## 2016-03-29 ENCOUNTER — Telehealth: Payer: Self-pay | Admitting: Internal Medicine

## 2016-03-29 NOTE — Telephone Encounter (Signed)
Received papers and Suanne Marker is calling [facility] trying to figure out what [facility] is needing. Will call pt back once we get is straight.

## 2016-03-29 NOTE — Telephone Encounter (Signed)
Have different phone message open in regards to this issue. Will close this note.

## 2016-03-29 NOTE — Telephone Encounter (Signed)
Please call patient regarding forms for her oxygen from [place].

## 2016-03-29 NOTE — Telephone Encounter (Signed)
Spoke with pt to inform her that [facility] informed Suanne Marker that they would contact Dr. Olin Pia office and ask him about the dx for hypersomnia w/osa. Pt stated that [facility] with [facility] called her around 4pm and stated that pt needs an appt with [facility] and that she needs sleep study. [facility] has never stated pt needs sleep study. Informed pt we would f/u with [facility] on this call. Called Melissa with [facility] and ask her to f/u on why Colletta [place] is informing pt of this without contacting our office. Will wait on response and informed pt I would contact her back I here back from [facility].

## 2016-04-01 ENCOUNTER — Telehealth: Payer: Self-pay | Admitting: *Deleted

## 2016-04-01 NOTE — Telephone Encounter (Signed)
LMOM that a sample of vaginal cream is up front and ready to be picked up per Larene Beach. Patient was wanting a call when more samples had come in.

## 2016-04-02 NOTE — Telephone Encounter (Signed)
Spoke with pt in reference to estrace cream and breast cancer. Made aware can use cream. Pt voiced understanding.

## 2016-04-02 NOTE — Telephone Encounter (Signed)
Spoke with pt in reference to breast cancer. Pt stated that she was dx in aug of 1999. Pt stated that she had a lumpectomy and then had a mastectomy.

## 2016-04-02 NOTE — Telephone Encounter (Signed)
It is fine for her to use the cream.

## 2016-04-02 NOTE — Telephone Encounter (Signed)
Asked Suanne Marker if she has heard back from [facility] in regards to this pt. She states she has LMOM for Melissa to call back.

## 2016-04-04 NOTE — Telephone Encounter (Signed)
Spoke with Darlina Guys yesterday (04/03/16) they have sent a message to Dr. [name] and are waiting on his response. Melissa states that she will keep [place] posted. Rhonda J Cobb

## 2016-04-09 ENCOUNTER — Encounter (INDEPENDENT_AMBULATORY_CARE_PROVIDER_SITE_OTHER): Payer: Self-pay | Admitting: Vascular Surgery

## 2016-04-09 ENCOUNTER — Ambulatory Visit (INDEPENDENT_AMBULATORY_CARE_PROVIDER_SITE_OTHER): Payer: Medicare Other | Admitting: Vascular Surgery

## 2016-04-09 VITALS — BP 129/56 | HR 67 | Resp 16 | Ht 60.0 in | Wt 263.0 lb

## 2016-04-09 DIAGNOSIS — I890 Lymphedema, not elsewhere classified: Secondary | ICD-10-CM | POA: Diagnosis not present

## 2016-04-09 DIAGNOSIS — M7989 Other specified soft tissue disorders: Secondary | ICD-10-CM

## 2016-04-09 DIAGNOSIS — E1140 Type 2 diabetes mellitus with diabetic neuropathy, unspecified: Secondary | ICD-10-CM

## 2016-04-09 DIAGNOSIS — I10 Essential (primary) hypertension: Secondary | ICD-10-CM

## 2016-04-09 DIAGNOSIS — I83813 Varicose veins of bilateral lower extremities with pain: Secondary | ICD-10-CM

## 2016-04-09 DIAGNOSIS — I700 Atherosclerosis of aorta: Secondary | ICD-10-CM | POA: Diagnosis not present

## 2016-04-10 DIAGNOSIS — I83813 Varicose veins of bilateral lower extremities with pain: Secondary | ICD-10-CM | POA: Insufficient documentation

## 2016-04-10 DIAGNOSIS — M7989 Other specified soft tissue disorders: Secondary | ICD-10-CM | POA: Insufficient documentation

## 2016-04-10 DIAGNOSIS — I890 Lymphedema, not elsewhere classified: Secondary | ICD-10-CM | POA: Insufficient documentation

## 2016-04-10 NOTE — Assessment & Plan Note (Signed)
Would contribute to lower extremity symptoms.

## 2016-04-10 NOTE — Assessment & Plan Note (Signed)
The patient has left upper extremity lymphedema. I believe she likely has bilateral lower extremity lymphedema secondary to previous cellulitis, chronic swelling and stasis changes, and scarring of the lymphatic channels. She has not had a venous duplex and it is important that this be evaluated as well to evaluate for venous insufficiency of the lower extremities. Recommend compression and elevation as tolerated.

## 2016-04-10 NOTE — Progress Notes (Signed)
MRN : [id number]  Teresa Krueger is a 82 y.o. (July 31, 1933) female who presents with chief complaint of  Chief Complaint  Patient presents with  . Follow-up  .  History of Present Illness: Patient presents for evaluation of pain and swelling in her lower extremities as well as a painful varicosity in her left pelvic region which has had intermittent episodes of what sounds like inflammation and hemorrhage. She had been seen once by my partner many months ago but failed to follow-up for her noninvasive studies. She has a litany of medical issues and is wheelchair-bound with poor mobility. She has morbid obesity and her legs are dependent much of the time and she is unable to elevate her legs secondary to back pain. Her legs become much more discolored and swollen over months to years. She describes these legs as heavy and achy much of the time. She does not have open ulceration. She does have a history of cellulitis of her lower extremities that was treated with oral antibiotics. The swelling has progressed since that time which was a couple of years ago. She also describes periodic episodes of what she thought was a boil coming up in her left medial thigh region. The area did become red, hot, and swollen and eventually would burst and a significant amount of blood would be expressed. This is now happened several times over the past few years. Her primary care physician told her this was secondary to a varicose vein that was becoming inflamed. She has no antecedent history of DVT or superficial thrombophlebitis to her knowledge. She does have a history of lymphedema in her left arm from breast cancer should she is familiar with that.  Current Outpatient Prescriptions  Medication Sig Dispense Refill  . acetaminophen (TYLENOL) 500 MG tablet Take 500 mg by mouth 3 (three) times daily.    Marland Kitchen albuterol (PROVENTIL HFA;VENTOLIN HFA) 108 (90 BASE) MCG/ACT inhaler Inhale 2 puffs into the lungs every 6 (six)  hours as needed for wheezing or shortness of breath.    . ALPRAZolam (XANAX) 0.5 MG tablet Take 1 tablet (0.5 mg total) by mouth 3 (three) times daily as needed for anxiety. 60 tablet 0  . conjugated estrogens (PREMARIN) vaginal cream Place 1 Applicatorful vaginally daily. Apply 0.5mg  (pea-sized amount)  just inside the vaginal introitus with a finger-tip every night for two weeks and then Monday, Wednesday and Friday nights. 30 g 12  . insulin lispro protamine-lispro (HUMALOG 75/25 MIX) (75-25) 100 UNIT/ML SUSP injection Inject 15 Units into the skin 2 (two) times daily. (Patient taking differently: Inject 10-12 Units into the skin 2 (two) times daily. ) 10 mL 11  . KLOR-CON M20 20 MEQ tablet Reported on 09/21/2015    . lidocaine (LIDODERM) 5 % Place 2 patches onto the skin daily. Remove & Discard patch within 12 hours or as directed by MD    . losartan (COZAAR) 25 MG tablet Take 25 mg by mouth daily.    . mometasone (ELOCON) 0.1 % lotion Reported on 12/05/2015    . Multiple Vitamin (MULTIVITAMIN WITH MINERALS) TABS tablet Take 1 tablet by mouth daily.    . mupirocin ointment (BACTROBAN) 2 % Reported on 12/05/2015    . omeprazole (PRILOSEC) 20 MG capsule Take 1 capsule (20 mg total) by mouth 2 (two) times daily. 60 capsule 5  . Respiratory Therapy Supplies (FLUTTER) DEVI Use as directed 1 each 0  . simvastatin (ZOCOR) 20 MG tablet Take 20 mg by mouth  daily.    . spironolactone (ALDACTONE) 25 MG tablet Take 25 mg by mouth every other day.     . torsemide (DEMADEX) 5 MG tablet Take 1 tablet (5 mg total) by mouth daily. (Patient taking differently: Take 5 mg by mouth every other day. ) 30 tablet 1  . traMADol (ULTRAM) 50 MG tablet Take by mouth.    . fluticasone (FLONASE) 50 MCG/ACT nasal spray Place 2 sprays into both nostrils daily. (Patient taking differently: Place 2 sprays into both nostrils daily as needed for rhinitis. ) 48 g 2  . sucralfate (CARAFATE) 1 G tablet Take 1 g by mouth 4 (four) times  daily -  with meals and at bedtime.      No current facility-administered medications for this visit.     Past Medical History:  Diagnosis Date  . Arthritis   . Asthma   . Barrett's esophagus   . Breast cancer ([place]) 1999   left breast  . Breast cancer, left ([place])   . Bronchiectasis ([place])   . COPD (chronic obstructive pulmonary disease) ([place])   . Diabetes mellitus ([place])   . Hypercholesterolemia   . Hypertension   . Lymphedema   . Mini stroke ([place])   . Osteoporosis   . Pacemaker     Past Surgical History:  Procedure Laterality Date  . BREAST LUMPECTOMY  1999   left  . CARPAL TUNNEL RELEASE     right  . CATARACT EXTRACTION     bilateral  . GALLBLADDER SURGERY    . MASTECTOMY  1999   left  . PACEMAKER INSERTION Right 03/21/2015   Procedure: INSERTION PACEMAKER;  Surgeon: Isaias Cowman, MD;  Location: [facility] ORS;  Service: Cardiovascular;  Laterality: Right;  . TOTAL HIP ARTHROPLASTY  2001   left  . TRIGGER FINGER RELEASE     bilateral    Social History Social History  Substance Use Topics  . Smoking status: Former Smoker    Packs/day: 1.00    Years: 30.00    Types: Cigarettes    Quit date: 12/27/1992  . Smokeless tobacco: Never Used  . Alcohol use No  No IVDU  Family History Family History  Problem Relation Age of Onset  . Heart disease Paternal Grandmother   . Diabetes Paternal Grandmother   . Heart disease Father   . Heart disease Mother   . Heart disease Maternal Grandmother   . Stroke Maternal Aunt   . Breast cancer Paternal Aunt 94  . Lung cancer Brother 74  . Kidney disease Cousin   . Bladder Cancer Neg Hx      Allergies  Allergen Reactions  . Aspirin Swelling and Other (See Comments)    Pt states that she has tongue swelling.    . Celebrex [Celecoxib] Anaphylaxis  . Meloxicam Swelling  . Nsaids Anaphylaxis  . Rofecoxib Anaphylaxis  . Tolmetin Anaphylaxis  . Bactrim [Sulfamethoxazole-Trimethoprim] Hives and Itching  . Biguanide  Copolymer Other (See Comments)    Reaction:  Unknown   . Buprenorphine Hcl Other (See Comments)    Reaction:  Unknown   . Ciprofloxacin Other (See Comments)    Reaction:  Unknown   . Codeine Nausea And Vomiting and Other (See Comments)    Reaction:  Syncope   . Delsym [Dextromethorphan Polistirex Er] Other (See Comments)    Reaction:  GI upset   . Dextromethorphan Hbr Other (See Comments)    Reaction:  GI upset   . Furosemide Other (See Comments)  Reaction:  Unknown   . Hydrocodone Other (See Comments)    Reaction:  Unknown   . Metformin Other (See Comments)    Reaction:  Unknown   . Morphine And Related Other (See Comments)    Reaction:  Unknown   . Penicillins Swelling and Other (See Comments)    Pt did not answer the additional questions for this medication because she does not remember.    . Pregabalin Other (See Comments)    Reaction:  Unknown   . Promethazine Other (See Comments)    Reaction:  Unknown   . Propoxyphene Other (See Comments)    Reaction:  Unknown   . Quinolones Other (See Comments)    Reaction:  Unknown   . Seroquel [Quetiapine Fumarate] Other (See Comments)    Reaction:  Unknown   . Serotonin Reuptake Inhibitors (Ssris) Other (See Comments)    Reaction:  Unknown   . Sulfa Antibiotics Hives and Itching  . Tramadol Nausea Only  . Ultracet [Tramadol-Acetaminophen] Other (See Comments)    Reaction:  Unknown      REVIEW OF SYSTEMS (Negative unless checked)  Constitutional: [] Weight loss  [] Fever  [] Chills Cardiac: [] Chest pain   [] Chest pressure   [] Palpitations   [] Shortness of breath when laying flat   [] Shortness of breath at rest   [x] Shortness of breath with exertion. Vascular:  [] Pain in legs with walking   [x] Pain in legs at rest   [] Pain in legs when laying flat   [] Claudication   [] Pain in feet when walking  [] Pain in feet at rest  [] Pain in feet when laying flat   [] History of DVT   [] Phlebitis   [x] Swelling in legs   [x] Varicose veins    [] Non-healing ulcers Pulmonary:   [] Uses home oxygen   [] Productive cough   [] Hemoptysis   [] Wheeze  [x] COPD   [] Asthma Neurologic:  [] Dizziness  [] Blackouts   [] Seizures   [] History of stroke   [] History of TIA  [] Aphasia   [] Temporary blindness   [] Dysphagia   [] Weakness or numbness in arms   [] Weakness or numbness in legs Musculoskeletal:  [] Arthritis   [] Joint swelling   [x] Joint pain   [x] Low back pain Hematologic:  [] Easy bruising  [] Easy bleeding   [] Hypercoagulable state   [] Anemic  [] Hepatitis Gastrointestinal:  [] Blood in stool   [] Vomiting blood  [] Gastroesophageal reflux/heartburn   [] Difficulty swallowing. Genitourinary:  [] Chronic kidney disease   [] Difficult urination  [] Frequent urination  [] Burning with urination   [] Blood in urine Skin:  [] Rashes   [] Ulcers   [] Wounds Psychological:  [] History of anxiety   []  History of major depression.  Physical Examination  Vitals:   04/09/16 1515  BP: (!) 129/56  Pulse: 67  Resp: 16  Weight: 119.3 kg (263 lb)  Height: 5' (1.524 m)   Body mass index is 51.36 kg/m. Gen:  WD/WN, NAD. Morbidly obese Head: [place]/AT, No temporalis wasting. Ear/Nose/Throat: Hearing grossly intact, nares w/o erythema or drainage, trachea midline Eyes: PERRLA. Sclera non-icteric Neck: Supple, no nuchal rigidity.  No JVD.  Pulmonary:  Good air movement, equal bilaterally.  Cardiac: RRR, normal S1, S2 Vascular: Scattered varicosities bilaterally with moderate to severe stasis dermatitis changes present bilaterally. She does have some varicosities in the groin and pelvic region a little worse on the left than the right. Vessel Right Left  Radial Palpable Palpable  Ulnar Palpable Palpable  Brachial Palpable Palpable  Carotid Palpable, without bruit Palpable, without bruit  Aorta Not palpable N/A  Femoral  Palpable Palpable  Popliteal Not Palpable Not Palpable  PT Not Palpable Not Palpable  DP 1+ Palpable Trace Palpable   Gastrointestinal: soft,  non-tender/non-distended. No guarding/reflex.  Musculoskeletal: No deformity or atrophy. 2+ bilateral lower extremity edema. Uses a wheelchair for mobility Neurologic: CN 2-12 intact. Pain and light touch intact in extremities.  Symmetrical.  Speech is fluent.  Psychiatric: Judgment intact, Mood & affect appropriate for pt's clinical situation. Dermatologic: No rashes or ulcers noted.  No cellulitis or open wounds. Lymph : No Cervical, Axillary, or Inguinal lymphadenopathy.     CBC Lab Results  Component Value Date   WBC 4.7 02/13/2016   HGB 12.8 02/13/2016   HCT 37.4 02/13/2016   MCV 86.6 02/13/2016   PLT 181 02/13/2016    BMET    Component Value Date/Time   NA 137 02/13/2016 1617   NA 141 03/18/2012 1341   K 5.0 02/13/2016 1617   K 4.9 03/18/2012 1341   CL 103 02/13/2016 1617   CL 104 03/18/2012 1341   CO2 26 02/13/2016 1617   CO2 29 03/18/2012 1341   GLUCOSE 124 (H) 02/13/2016 1617   GLUCOSE 64 (L) 03/18/2012 1341   BUN 31 (H) 02/13/2016 1617   BUN 34 (H) 03/18/2012 1341   CREATININE 1.13 (H) 02/13/2016 1617   CREATININE 1.40 (H) 04/06/2012 1359   CALCIUM 9.3 02/13/2016 1617   CALCIUM 9.4 03/18/2012 1341   GFRNONAA 45 (L) 02/13/2016 1617   GFRNONAA 36 (L) 04/06/2012 1359   GFRAA 52 (L) 02/13/2016 1617   GFRAA 42 (L) 04/06/2012 1359   CrCl cannot be calculated (Patient's most recent lab result is older than the maximum 21 days allowed.).  COAG Lab Results  Component Value Date   INR 1.16 03/20/2015    Radiology No results found.    Assessment/Plan Hypertension blood pressure control important in reducing the progression of atherosclerotic disease. On appropriate oral medications.   Thoracic aortic atherosclerosis ([place]) No intervention required.  Already on appropriate medical management  Diabetes mellitus blood glucose control important in reducing the progression of atherosclerotic disease. Also, involved in wound healing. On appropriate  medications.   Morbid obesity Would contribute to lower extremity symptoms.  Swelling of limb The patient has prominent swelling and stasis changes to both lower extremities. This is likely multifactorial including her morbid obesity, immobility with dependent extremities, but venous insufficiency may be playing a major contributing role. We discussed the role of leg elevation and compression stockings. She has a difficult  time getting on stockings with her back and size and she has a difficult time elevating her legs because of her back. increased activity would also be of benefit. We will see her back following her venous duplex to discuss the results and determine further treatment options.   Varicose veins of leg with pain, bilateral The patient has what appears to be a pelvic varicosity on the left as well as other prominent varicosities bilaterally. These are locally painful. The patient has prominent swelling and stasis changes to both lower extremities. This is likely multifactorial including her morbid obesity, immobility with dependent extremities, but venous insufficiency may be playing a major contributing role. We discussed the role of leg elevation and compression stockings. She has a difficult  time getting on stockings with her back and size and she has a difficult time elevating her legs because of her back. increased activity would also be of benefit. We will see her back following her venous duplex to discuss the results  and determine further treatment options.   Lymphedema The patient has left upper extremity lymphedema. I believe she likely has bilateral lower extremity lymphedema secondary to previous cellulitis, chronic swelling and stasis changes, and scarring of the lymphatic channels. She has not had a venous duplex and it is important that this be evaluated as well to evaluate for venous insufficiency of the lower extremities. Recommend compression and elevation as  tolerated.    Leotis Pain, MD  04/10/2016 1:22 PM    This note was created with Dragon medical transcription system.  Any errors from dictation are purely unintentional

## 2016-04-10 NOTE — Assessment & Plan Note (Signed)
No intervention required.  Already on appropriate medical management

## 2016-04-10 NOTE — Assessment & Plan Note (Signed)
The patient has what appears to be a pelvic varicosity on the left as well as other prominent varicosities bilaterally. These are locally painful. The patient has prominent swelling and stasis changes to both lower extremities. This is likely multifactorial including her morbid obesity, immobility with dependent extremities, but venous insufficiency may be playing a major contributing role. We discussed the role of leg elevation and compression stockings. She has a difficult  time getting on stockings with her back and size and she has a difficult time elevating her legs because of her back. increased activity would also be of benefit. We will see her back following her venous duplex to discuss the results and determine further treatment options.

## 2016-04-10 NOTE — Assessment & Plan Note (Signed)
blood pressure control important in reducing the progression of atherosclerotic disease. On appropriate oral medications.  

## 2016-04-10 NOTE — Assessment & Plan Note (Signed)
blood glucose control important in reducing the progression of atherosclerotic disease. Also, involved in wound healing. On appropriate medications.  

## 2016-04-10 NOTE — Assessment & Plan Note (Signed)
The patient has prominent swelling and stasis changes to both lower extremities. This is likely multifactorial including her morbid obesity, immobility with dependent extremities, but venous insufficiency may be playing a major contributing role. We discussed the role of leg elevation and compression stockings. She has a difficult  time getting on stockings with her back and size and she has a difficult time elevating her legs because of her back. increased activity would also be of benefit. We will see her back following her venous duplex to discuss the results and determine further treatment options.

## 2016-04-12 ENCOUNTER — Telehealth: Payer: Self-pay | Admitting: Internal Medicine

## 2016-04-12 NOTE — Telephone Encounter (Signed)
We are waiting to hear back from [facility]. Will close note due to another phone message open.

## 2016-04-12 NOTE — Telephone Encounter (Signed)
Pt states the [facility] is requesting documentation that she still needs oxygen. Please call.

## 2016-04-18 NOTE — Telephone Encounter (Signed)
Teresa Krueger sent message to [facility] at [facility] and she states she will call back.

## 2016-04-19 ENCOUNTER — Encounter (INDEPENDENT_AMBULATORY_CARE_PROVIDER_SITE_OTHER): Payer: Medicare Other

## 2016-04-23 ENCOUNTER — Ambulatory Visit (INDEPENDENT_AMBULATORY_CARE_PROVIDER_SITE_OTHER): Payer: Medicare Other | Admitting: Vascular Surgery

## 2016-04-24 NOTE — Telephone Encounter (Signed)
Still waiting to hear back from [facility] but will close phone note per message below.   From: Darlina Guys  Sent: 04/24/2016 10:53 AM  To: Catha Gosselin  Subject: RE: Any updates on the below patient       We have not yet gotten a response back from Dr. [name]. We are continuing to work on this in multiple departments. The pt has not been charged anything as we are still working on getting her qualified. You don't need to keep your message open.   The pt could also call Dr. Olin Pia office if she would like to. Some MD offices respond better/quicker when the pt calls vs the DME.Marland KitchenMarland Kitchen

## 2016-05-02 DIAGNOSIS — I350 Nonrheumatic aortic (valve) stenosis: Secondary | ICD-10-CM | POA: Insufficient documentation

## 2016-05-02 DIAGNOSIS — R0602 Shortness of breath: Secondary | ICD-10-CM | POA: Insufficient documentation

## 2016-05-10 ENCOUNTER — Telehealth: Payer: Self-pay | Admitting: Internal Medicine

## 2016-05-10 DIAGNOSIS — G4719 Other hypersomnia: Secondary | ICD-10-CM

## 2016-05-10 NOTE — Telephone Encounter (Signed)
Teresa Krueger is sending message to [facility] with [facility] [place].

## 2016-05-10 NOTE — Telephone Encounter (Signed)
Pt states [facility] is telling her she has to have a sleep study, or they will take her oxygen. Please call.

## 2016-05-10 NOTE — Telephone Encounter (Signed)
Spoke with Darlina Guys with [facility]. After getting with Dr. Olin Pia office their office states that patient had a sleep study back in 2004 and does have OSA. Pt stated that she was never told that and doesn't remember having a sleep study. Per Dr. Olin Pia office pt does have OSA and will need another Sleep Study.   Darlina Guys with [facility] states that in order for pt to qualify for her o2 that pt will need to go back to the sleep lab for another study and to have o2 bleed in as needed.  Please advise. Rhonda J Cobb

## 2016-05-13 NOTE — Telephone Encounter (Signed)
ok 

## 2016-05-13 NOTE — Addendum Note (Signed)
Addended by: Oscar La R on: 05/13/2016 10:27 AM   Modules accepted: Orders

## 2016-05-13 NOTE — Telephone Encounter (Signed)
Split night ordered per [facility]. Pt informed. Nothing further needed.

## 2016-05-24 DIAGNOSIS — J9611 Chronic respiratory failure with hypoxia: Secondary | ICD-10-CM | POA: Insufficient documentation

## 2016-06-05 ENCOUNTER — Encounter (INDEPENDENT_AMBULATORY_CARE_PROVIDER_SITE_OTHER): Payer: Medicare Other

## 2016-06-05 ENCOUNTER — Ambulatory Visit (INDEPENDENT_AMBULATORY_CARE_PROVIDER_SITE_OTHER): Payer: Medicare Other | Admitting: Vascular Surgery

## 2016-06-07 ENCOUNTER — Ambulatory Visit: Payer: Medicare Other | Admitting: Podiatry

## 2016-06-07 ENCOUNTER — Ambulatory Visit (INDEPENDENT_AMBULATORY_CARE_PROVIDER_SITE_OTHER): Payer: Medicare Other | Admitting: Podiatry

## 2016-06-07 DIAGNOSIS — L608 Other nail disorders: Secondary | ICD-10-CM

## 2016-06-07 DIAGNOSIS — M79609 Pain in unspecified limb: Secondary | ICD-10-CM

## 2016-06-07 DIAGNOSIS — B351 Tinea unguium: Secondary | ICD-10-CM | POA: Diagnosis not present

## 2016-06-07 DIAGNOSIS — L603 Nail dystrophy: Secondary | ICD-10-CM

## 2016-06-07 DIAGNOSIS — E0843 Diabetes mellitus due to underlying condition with diabetic autonomic (poly)neuropathy: Secondary | ICD-10-CM

## 2016-06-09 NOTE — Progress Notes (Signed)
SUBJECTIVE Patient with a history of diabetes mellitus presents to office today complaining of elongated, thickened nails. Pain while ambulating in shoes. Patient is unable to trim their own nails.   Allergies  Allergen Reactions  . Aspirin Swelling and Other (See Comments)    Pt states that she has tongue swelling.    . Celebrex [Celecoxib] Anaphylaxis  . Meloxicam Swelling  . Nsaids Anaphylaxis  . Rofecoxib Anaphylaxis  . Tolmetin Anaphylaxis  . Bactrim [Sulfamethoxazole-Trimethoprim] Hives and Itching  . Biguanide Copolymer Other (See Comments)    Reaction:  Unknown   . Buprenorphine Hcl Other (See Comments)    Reaction:  Unknown   . Ciprofloxacin Other (See Comments)    Reaction:  Unknown   . Codeine Nausea And Vomiting and Other (See Comments)    Reaction:  Syncope   . Delsym [Dextromethorphan Polistirex Er] Other (See Comments)    Reaction:  GI upset   . Dextromethorphan Hbr Other (See Comments)    Reaction:  GI upset   . Furosemide Other (See Comments)    Reaction:  Unknown   . Hydrocodone Other (See Comments)    Reaction:  Unknown   . Metformin Other (See Comments)    Reaction:  Unknown   . Morphine And Related Other (See Comments)    Reaction:  Unknown   . Penicillins Swelling and Other (See Comments)    Pt did not answer the additional questions for this medication because she does not remember.    . Pregabalin Other (See Comments)    Reaction:  Unknown   . Promethazine Other (See Comments)    Reaction:  Unknown   . Propoxyphene Other (See Comments)    Reaction:  Unknown   . Quinolones Other (See Comments)    Reaction:  Unknown   . Seroquel [Quetiapine Fumarate] Other (See Comments)    Reaction:  Unknown   . Serotonin Reuptake Inhibitors (Ssris) Other (See Comments)    Reaction:  Unknown   . Sulfa Antibiotics Hives and Itching  . Tramadol Nausea Only  . Ultracet [Tramadol-Acetaminophen] Other (See Comments)    Reaction:  Unknown     OBJECTIVE General  Patient is awake, alert, and oriented x 3 and in no acute distress. Derm Skin is dry and supple bilateral. Negative open lesions or macerations. Remaining integument unremarkable. Nails are tender, long, thickened and dystrophic with subungual debris, consistent with onychomycosis, 1-5 bilateral. No signs of infection noted. Vasc  DP and PT pedal pulses palpable bilaterally. Temperature gradient within normal limits.  Neuro Epicritic and protective threshold sensation diminished bilaterally.  Musculoskeletal Exam No symptomatic pedal deformities noted bilateral. Muscular strength within normal limits.  ASSESSMENT 1. Diabetes Mellitus w/ peripheral neuropathy 2. Onychomycosis of nail due to dermatophyte bilateral 3. Pain in foot bilateral  PLAN OF CARE 1. Patient evaluated today. 2. Instructed to maintain good pedal hygiene and foot care. Stressed importance of controlling blood sugar.  3. Mechanical debridement of nails 1-5 bilaterally performed using a nail nipper. Filed with dremel without incident.  4. Return to clinic in 3 mos.     Edrick Kins, DPM [facility]  Dr. Edrick Kins, [address]                                        [place], [place] [address]  Office [phone]  Fax [phone]

## 2016-06-11 ENCOUNTER — Ambulatory Visit (INDEPENDENT_AMBULATORY_CARE_PROVIDER_SITE_OTHER): Payer: Medicare Other | Admitting: Vascular Surgery

## 2016-06-11 ENCOUNTER — Encounter (INDEPENDENT_AMBULATORY_CARE_PROVIDER_SITE_OTHER): Payer: Medicare Other

## 2016-07-04 ENCOUNTER — Telehealth: Payer: Self-pay | Admitting: Internal Medicine

## 2016-07-04 NOTE — Telephone Encounter (Signed)
Patient needs a letter stating she needs continuous oxygen or just at night for insurance. Please call patient.

## 2016-07-05 NOTE — Telephone Encounter (Signed)
Spoke with pt and she states that Dr. [name] has ordered O2 cont for her. She wants you to look at what Dr. [name] has done and see if you agree. I informed pt she can get them to fax it but if you can't order anything different unless she has a walk test with [place]. Once paper is faxed I will place in your folder to look at.

## 2016-07-09 ENCOUNTER — Ambulatory Visit: Payer: Medicare Other | Admitting: Podiatry

## 2016-07-31 ENCOUNTER — Encounter (INDEPENDENT_AMBULATORY_CARE_PROVIDER_SITE_OTHER): Payer: Medicare Other

## 2016-08-12 ENCOUNTER — Ambulatory Visit (INDEPENDENT_AMBULATORY_CARE_PROVIDER_SITE_OTHER): Payer: Medicare Other | Admitting: Vascular Surgery

## 2016-08-12 ENCOUNTER — Ambulatory Visit (INDEPENDENT_AMBULATORY_CARE_PROVIDER_SITE_OTHER): Payer: Medicare Other

## 2016-08-12 DIAGNOSIS — M7989 Other specified soft tissue disorders: Secondary | ICD-10-CM

## 2016-08-15 ENCOUNTER — Encounter (INDEPENDENT_AMBULATORY_CARE_PROVIDER_SITE_OTHER): Payer: Self-pay | Admitting: Vascular Surgery

## 2016-08-20 ENCOUNTER — Other Ambulatory Visit: Payer: Self-pay | Admitting: Internal Medicine

## 2016-08-20 ENCOUNTER — Ambulatory Visit (INDEPENDENT_AMBULATORY_CARE_PROVIDER_SITE_OTHER): Payer: Medicare Other | Admitting: Podiatry

## 2016-08-20 ENCOUNTER — Ambulatory Visit (INDEPENDENT_AMBULATORY_CARE_PROVIDER_SITE_OTHER): Payer: Medicare Other

## 2016-08-20 ENCOUNTER — Encounter: Payer: Self-pay | Admitting: Podiatry

## 2016-08-20 DIAGNOSIS — S8011XA Contusion of right lower leg, initial encounter: Secondary | ICD-10-CM | POA: Diagnosis not present

## 2016-08-20 DIAGNOSIS — M722 Plantar fascial fibromatosis: Secondary | ICD-10-CM | POA: Diagnosis not present

## 2016-08-20 DIAGNOSIS — Z1231 Encounter for screening mammogram for malignant neoplasm of breast: Secondary | ICD-10-CM

## 2016-08-20 DIAGNOSIS — M79671 Pain in right foot: Secondary | ICD-10-CM | POA: Diagnosis not present

## 2016-08-28 ENCOUNTER — Telehealth: Payer: Self-pay | Admitting: Internal Medicine

## 2016-08-28 NOTE — Telephone Encounter (Signed)
Pt is asking about changing companies where we send their Oxygen  They are asking if we when we do, please send in an order for something a bit smaller than the ones we sent in last time. Something more protable  She and her spouse are using the same and both are asking for a smaller one.  Please advise.

## 2016-08-29 NOTE — Telephone Encounter (Signed)
Melissa with [facility] will contact pt in regards to her O2. Nothing further needed at this time.

## 2016-09-02 MED ORDER — BETAMETHASONE SOD PHOS & ACET 6 (3-3) MG/ML IJ SUSP: 3.0000 mg | Freq: Once | INTRAMUSCULAR | Status: DC
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
## 2016-09-06 ENCOUNTER — Ambulatory Visit (INDEPENDENT_AMBULATORY_CARE_PROVIDER_SITE_OTHER): Payer: Medicare Other | Admitting: Podiatry

## 2016-09-06 ENCOUNTER — Encounter: Payer: Self-pay | Admitting: Podiatry

## 2016-09-06 DIAGNOSIS — M79609 Pain in unspecified limb: Secondary | ICD-10-CM

## 2016-09-06 DIAGNOSIS — L851 Acquired keratosis [keratoderma] palmaris et plantaris: Secondary | ICD-10-CM | POA: Diagnosis not present

## 2016-09-06 DIAGNOSIS — B351 Tinea unguium: Secondary | ICD-10-CM | POA: Diagnosis not present

## 2016-09-06 DIAGNOSIS — M79674 Pain in right toe(s): Secondary | ICD-10-CM | POA: Diagnosis not present

## 2016-09-06 DIAGNOSIS — L84 Corns and callosities: Secondary | ICD-10-CM | POA: Diagnosis not present

## 2016-09-06 DIAGNOSIS — M79671 Pain in right foot: Secondary | ICD-10-CM

## 2016-09-06 DIAGNOSIS — L608 Other nail disorders: Secondary | ICD-10-CM | POA: Diagnosis not present

## 2016-09-06 DIAGNOSIS — M79672 Pain in left foot: Secondary | ICD-10-CM

## 2016-09-06 DIAGNOSIS — M79675 Pain in left toe(s): Secondary | ICD-10-CM | POA: Diagnosis not present

## 2016-09-06 DIAGNOSIS — L603 Nail dystrophy: Secondary | ICD-10-CM
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
## 2016-10-03 ENCOUNTER — Ambulatory Visit
Admission: RE | Admit: 2016-10-03 | Discharge: 2016-10-03 | Disposition: A | Discharge status: Home / Self Care | Payer: Medicare Other | Source: Ambulatory Visit | Attending: Internal Medicine | Admitting: Internal Medicine

## 2016-10-03 DIAGNOSIS — Z1231 Encounter for screening mammogram for malignant neoplasm of breast: Secondary | ICD-10-CM | POA: Insufficient documentation

## 2016-10-14 ENCOUNTER — Observation Stay
Admission: EM | Admit: 2016-10-14 | Discharge: 2016-10-15 | Disposition: A | Discharge status: Home / Self Care | Payer: Medicare Other | Attending: Internal Medicine | Admitting: Internal Medicine

## 2016-10-14 ENCOUNTER — Encounter: Payer: Self-pay | Admitting: Internal Medicine

## 2016-10-14 ENCOUNTER — Emergency Department: Payer: Medicare Other

## 2016-10-14 ENCOUNTER — Ambulatory Visit (INDEPENDENT_AMBULATORY_CARE_PROVIDER_SITE_OTHER): Payer: Medicare Other | Admitting: Internal Medicine

## 2016-10-14 ENCOUNTER — Encounter: Payer: Self-pay | Admitting: Emergency Medicine

## 2016-10-14 VITALS — BP 132/80 | HR 73 | Ht 60.0 in | Wt 261.0 lb

## 2016-10-14 DIAGNOSIS — I7389 Other specified peripheral vascular diseases: Secondary | ICD-10-CM | POA: Insufficient documentation

## 2016-10-14 DIAGNOSIS — Z888 Allergy status to other drugs, medicaments and biological substances status: Secondary | ICD-10-CM | POA: Insufficient documentation

## 2016-10-14 DIAGNOSIS — Z86010 Personal history of colonic polyps: Secondary | ICD-10-CM | POA: Insufficient documentation

## 2016-10-14 DIAGNOSIS — Z853 Personal history of malignant neoplasm of breast: Secondary | ICD-10-CM | POA: Diagnosis not present

## 2016-10-14 DIAGNOSIS — J9611 Chronic respiratory failure with hypoxia: Secondary | ICD-10-CM

## 2016-10-14 DIAGNOSIS — Z96642 Presence of left artificial hip joint: Secondary | ICD-10-CM | POA: Diagnosis not present

## 2016-10-14 DIAGNOSIS — E119 Type 2 diabetes mellitus without complications: Secondary | ICD-10-CM | POA: Diagnosis not present

## 2016-10-14 DIAGNOSIS — Z880 Allergy status to penicillin: Secondary | ICD-10-CM | POA: Insufficient documentation

## 2016-10-14 DIAGNOSIS — Z8673 Personal history of transient ischemic attack (TIA), and cerebral infarction without residual deficits: Secondary | ICD-10-CM | POA: Diagnosis not present

## 2016-10-14 DIAGNOSIS — Z950 Presence of cardiac pacemaker: Secondary | ICD-10-CM | POA: Diagnosis not present

## 2016-10-14 DIAGNOSIS — Z8249 Family history of ischemic heart disease and other diseases of the circulatory system: Secondary | ICD-10-CM | POA: Diagnosis not present

## 2016-10-14 DIAGNOSIS — E7800 Pure hypercholesterolemia, unspecified: Secondary | ICD-10-CM | POA: Insufficient documentation

## 2016-10-14 DIAGNOSIS — G4730 Sleep apnea, unspecified: Secondary | ICD-10-CM | POA: Insufficient documentation

## 2016-10-14 DIAGNOSIS — Z841 Family history of disorders of kidney and ureter: Secondary | ICD-10-CM | POA: Insufficient documentation

## 2016-10-14 DIAGNOSIS — J449 Chronic obstructive pulmonary disease, unspecified: Secondary | ICD-10-CM | POA: Diagnosis not present

## 2016-10-14 DIAGNOSIS — M1990 Unspecified osteoarthritis, unspecified site: Secondary | ICD-10-CM | POA: Insufficient documentation

## 2016-10-14 DIAGNOSIS — G4710 Hypersomnia, unspecified: Secondary | ICD-10-CM | POA: Insufficient documentation

## 2016-10-14 DIAGNOSIS — M810 Age-related osteoporosis without current pathological fracture: Secondary | ICD-10-CM | POA: Diagnosis not present

## 2016-10-14 DIAGNOSIS — I700 Atherosclerosis of aorta: Secondary | ICD-10-CM | POA: Insufficient documentation

## 2016-10-14 DIAGNOSIS — F411 Generalized anxiety disorder: Secondary | ICD-10-CM | POA: Diagnosis not present

## 2016-10-14 DIAGNOSIS — Z6841 Body Mass Index (BMI) 40.0 and over, adult: Secondary | ICD-10-CM | POA: Insufficient documentation

## 2016-10-14 DIAGNOSIS — Z794 Long term (current) use of insulin: Secondary | ICD-10-CM | POA: Insufficient documentation

## 2016-10-14 DIAGNOSIS — Z9841 Cataract extraction status, right eye: Secondary | ICD-10-CM | POA: Diagnosis not present

## 2016-10-14 DIAGNOSIS — Z801 Family history of malignant neoplasm of trachea, bronchus and lung: Secondary | ICD-10-CM | POA: Insufficient documentation

## 2016-10-14 DIAGNOSIS — Z9981 Dependence on supplemental oxygen: Secondary | ICD-10-CM | POA: Diagnosis not present

## 2016-10-14 DIAGNOSIS — Z803 Family history of malignant neoplasm of breast: Secondary | ICD-10-CM | POA: Diagnosis not present

## 2016-10-14 DIAGNOSIS — Z833 Family history of diabetes mellitus: Secondary | ICD-10-CM | POA: Diagnosis not present

## 2016-10-14 DIAGNOSIS — I2510 Atherosclerotic heart disease of native coronary artery without angina pectoris: Secondary | ICD-10-CM | POA: Insufficient documentation

## 2016-10-14 DIAGNOSIS — Z87891 Personal history of nicotine dependence: Secondary | ICD-10-CM | POA: Diagnosis not present

## 2016-10-14 DIAGNOSIS — G459 Transient cerebral ischemic attack, unspecified: Principal | ICD-10-CM | POA: Diagnosis present

## 2016-10-14 DIAGNOSIS — Z885 Allergy status to narcotic agent status: Secondary | ICD-10-CM | POA: Insufficient documentation

## 2016-10-14 DIAGNOSIS — K2270 Barrett's esophagus without dysplasia: Secondary | ICD-10-CM | POA: Diagnosis not present

## 2016-10-14 DIAGNOSIS — Z881 Allergy status to other antibiotic agents status: Secondary | ICD-10-CM | POA: Insufficient documentation

## 2016-10-14 DIAGNOSIS — I890 Lymphedema, not elsewhere classified: Secondary | ICD-10-CM | POA: Diagnosis not present

## 2016-10-14 DIAGNOSIS — Z9842 Cataract extraction status, left eye: Secondary | ICD-10-CM | POA: Diagnosis not present

## 2016-10-14 DIAGNOSIS — Z9049 Acquired absence of other specified parts of digestive tract: Secondary | ICD-10-CM | POA: Diagnosis not present

## 2016-10-14 DIAGNOSIS — Z79899 Other long term (current) drug therapy: Secondary | ICD-10-CM | POA: Insufficient documentation

## 2016-10-14 DIAGNOSIS — Z886 Allergy status to analgesic agent status: Secondary | ICD-10-CM | POA: Insufficient documentation

## 2016-10-14 DIAGNOSIS — K219 Gastro-esophageal reflux disease without esophagitis: Secondary | ICD-10-CM | POA: Insufficient documentation

## 2016-10-14 DIAGNOSIS — I10 Essential (primary) hypertension: Secondary | ICD-10-CM | POA: Diagnosis not present

## 2016-10-14 DIAGNOSIS — Z823 Family history of stroke: Secondary | ICD-10-CM | POA: Insufficient documentation

## 2016-10-14 DIAGNOSIS — M19019 Primary osteoarthritis, unspecified shoulder: Secondary | ICD-10-CM | POA: Insufficient documentation

## 2016-10-14 DIAGNOSIS — Z882 Allergy status to sulfonamides status: Secondary | ICD-10-CM | POA: Insufficient documentation

## 2016-10-14 DIAGNOSIS — E785 Hyperlipidemia, unspecified: Secondary | ICD-10-CM | POA: Diagnosis not present

## 2016-10-14 DIAGNOSIS — I441 Atrioventricular block, second degree: Secondary | ICD-10-CM | POA: Insufficient documentation

## 2016-10-14 DIAGNOSIS — Z7902 Long term (current) use of antithrombotics/antiplatelets: Secondary | ICD-10-CM | POA: Insufficient documentation

## 2016-10-14 LAB — BASIC METABOLIC PANEL
ANION GAP: 7 (ref 5–15)
BUN: 27 mg/dL — ABNORMAL HIGH (ref 6–20)
CALCIUM: 9.4 mg/dL (ref 8.9–10.3)
CO2: 27 mmol/L (ref 22–32)
Chloride: 103 mmol/L (ref 101–111)
Creatinine, Ser: 1.12 mg/dL — ABNORMAL HIGH (ref 0.44–1.00)
GFR calc Af Amer: 52 mL/min — ABNORMAL LOW (ref >60)
GFR, EST NON AFRICAN AMERICAN: 45 mL/min — AB (ref >60)
GLUCOSE: 194 mg/dL — AB (ref 65–99)
Potassium: 4.8 mmol/L (ref 3.5–5.1)
Sodium: 137 mmol/L (ref 135–145)

## 2016-10-14 LAB — URINALYSIS, COMPLETE (UACMP) WITH MICROSCOPIC
Bilirubin Urine: NEGATIVE
GLUCOSE, UA: NEGATIVE mg/dL
HGB URINE DIPSTICK: NEGATIVE
Ketones, ur: NEGATIVE mg/dL
Leukocytes, UA: NEGATIVE
Nitrite: NEGATIVE
PH: 7 (ref 5.0–8.0)
Protein, ur: NEGATIVE mg/dL
SPECIFIC GRAVITY, URINE: 1.008 (ref 1.005–1.030)

## 2016-10-14 LAB — CBC
HCT: 37.7 % (ref 35.0–47.0)
HEMOGLOBIN: 12.4 g/dL (ref 12.0–16.0)
MCH: 29 pg (ref 26.0–34.0)
MCHC: 33 g/dL (ref 32.0–36.0)
MCV: 87.8 fL (ref 80.0–100.0)
PLATELETS: 198 10*3/uL (ref 150–440)
RBC: 4.29 MIL/uL (ref 3.80–5.20)
RDW: 13.6 % (ref 11.5–14.5)
WBC: 4.6 10*3/uL (ref 3.6–11.0)

## 2016-10-14 LAB — GLUCOSE, CAPILLARY: Glucose-Capillary: 219 mg/dL — ABNORMAL HIGH (ref 65–99)

## 2016-10-14 LAB — PROTIME-INR
INR: 1.09
Prothrombin Time: 14.1 seconds (ref 11.4–15.2)

## 2016-10-14 LAB — APTT: aPTT: 30 seconds (ref 24–36)

## 2016-10-14 MED ORDER — STROKE: EARLY STAGES OF RECOVERY BOOK
Freq: Once | Status: AC
  Administered 2016-10-14: 23:00:00

## 2016-10-14 MED ORDER — LOSARTAN POTASSIUM 50 MG PO TABS: 25.0000 mg | ORAL_TABLET | Freq: Every day | ORAL | Status: DC

## 2016-10-14 MED ORDER — SIMVASTATIN 20 MG PO TABS: 20.0000 mg | ORAL_TABLET | Freq: Every day | ORAL | Status: DC

## 2016-10-14 MED ORDER — ACETAMINOPHEN 500 MG PO TABS
1000.0000 mg | ORAL_TABLET | Freq: Every evening | ORAL | Status: DC | PRN
  Administered 2016-10-15: 500 mg via ORAL
  Filled 2016-10-14: qty 2

## 2016-10-14 MED ORDER — INSULIN ASPART 100 UNIT/ML ~~LOC~~ SOLN: 0.0000 [IU] | Freq: Three times a day (TID) | SUBCUTANEOUS | Status: DC

## 2016-10-14 MED ORDER — PANTOPRAZOLE SODIUM 40 MG PO TBEC
40.0000 mg | DELAYED_RELEASE_TABLET | Freq: Every day | ORAL | Status: DC
  Administered 2016-10-15: 09:00:00 40 mg via ORAL
  Filled 2016-10-14: qty 1

## 2016-10-14 MED ORDER — FLUTICASONE PROPIONATE 50 MCG/ACT NA SUSP
2.0000 | Freq: Every day | NASAL | Status: DC
  Administered 2016-10-15: 2 via NASAL
  Filled 2016-10-14: qty 16

## 2016-10-14 MED ORDER — AMBULATORY NON FORMULARY MEDICATION: 0 refills | Status: DC

## 2016-10-14 MED ORDER — INSULIN ASPART 100 UNIT/ML ~~LOC~~ SOLN
0.0000 [IU] | Freq: Three times a day (TID) | SUBCUTANEOUS | Status: DC
  Administered 2016-10-15: 12:00:00 2 [IU] via SUBCUTANEOUS
  Administered 2016-10-15: 3 [IU] via SUBCUTANEOUS
  Filled 2016-10-14: qty 2
  Filled 2016-10-14: qty 3

## 2016-10-14 MED ORDER — CLOPIDOGREL BISULFATE 75 MG PO TABS
75.0000 mg | ORAL_TABLET | Freq: Every day | ORAL | Status: DC
  Administered 2016-10-15 (×2): 75 mg via ORAL
  Filled 2016-10-14 (×2): qty 1

## 2016-10-14 MED ORDER — POTASSIUM CHLORIDE CRYS ER 20 MEQ PO TBCR: 20.0000 meq | EXTENDED_RELEASE_TABLET | ORAL | Status: DC

## 2016-10-14 MED ORDER — TRAMADOL HCL 50 MG PO TABS
50.0000 mg | ORAL_TABLET | Freq: Four times a day (QID) | ORAL | Status: DC | PRN
  Administered 2016-10-15: 09:00:00 50 mg via ORAL
  Filled 2016-10-14: qty 1

## 2016-10-14 MED ORDER — ALPRAZOLAM 0.5 MG PO TABS
0.5000 mg | ORAL_TABLET | Freq: Three times a day (TID) | ORAL | Status: DC | PRN
  Administered 2016-10-15 (×2): 0.5 mg via ORAL
  Filled 2016-10-14 (×2): qty 1

## 2016-10-14 MED ORDER — LOSARTAN POTASSIUM 50 MG PO TABS
25.0000 mg | ORAL_TABLET | Freq: Every day | ORAL | Status: DC
  Administered 2016-10-15: 25 mg via ORAL
  Filled 2016-10-14: qty 1

## 2016-10-14 MED ORDER — ENOXAPARIN SODIUM 40 MG/0.4ML ~~LOC~~ SOLN
40.0000 mg | SUBCUTANEOUS | Status: DC
  Administered 2016-10-15: 40 mg via SUBCUTANEOUS
  Filled 2016-10-14: qty 0.4

## 2016-10-14 MED ORDER — ADULT MULTIVITAMIN W/MINERALS CH
1.0000 | ORAL_TABLET | Freq: Every day | ORAL | Status: DC
  Administered 2016-10-15: 09:00:00 1 via ORAL
  Filled 2016-10-14: qty 1

## 2016-10-14 MED ORDER — SIMETHICONE 80 MG PO CHEW: 80.0000 mg | CHEWABLE_TABLET | Freq: Four times a day (QID) | ORAL | Status: DC | PRN

## 2016-10-14 NOTE — ED Notes (Signed)
Pt given sandwich tray 

## 2016-10-14 NOTE — ED Notes (Addendum)
Pt states Friday afternoon she thinks she have a  "mini stroke" pt states she had blurred vision in both eyes and was pointing to things but couldn't speak. Husband with pt states that pt wasn't really talking. Pt states couple of years ago she had same type of event. Pt alert, oriented, talking in complete sentences. Pt wants to stay in her motorized wheelchair. Pt states that event on Friday maybe lasted 10 minutes. Pt states since then vision has been fine, able to move all extremities. Pt states thursday night L  Arm went asleep and "it doesn't normally do that."

## 2016-10-14 NOTE — ED Triage Notes (Signed)
Pt states Dr. [name] sent her here for evaluation of possible TIA that she thinks she had three days ago.

## 2016-10-14 NOTE — ED Provider Notes (Signed)
[facility] Emergency Department Provider Note  ____________________________________________   I have reviewed the triage vital signs and the nursing notes.   HISTORY  Chief Complaint Transient Ischemic Attack   History limited by: Not Limited   HPI Teresa Krueger is a 83 y.o. female who presents to the emergency department today by primary care doctor's office because of concerns for TIA. The patient states that 3 days ago she had an episode with slurred speech and vision changes. The whole episode lasted about 15 minutes. She was at work when this happened. She currently denies any symptoms. She denied any concurrent chest pain or shortness of breath.   Past Medical History:  Diagnosis Date  . Arthritis   . Asthma   . Barrett's esophagus   . Breast cancer ([place]) 1999   LT MASTECTOMY  . Bronchiectasis ([place])   . COPD (chronic obstructive pulmonary disease) ([place])   . Diabetes mellitus ([place])   . Hypercholesterolemia   . Hypertension   . Lymphedema   . Mini stroke ([place])   . Osteoporosis   . Pacemaker     Patient Active Problem List   Diagnosis Date Noted  . Swelling of limb 04/10/2016  . Varicose veins of leg with pain, bilateral 04/10/2016  . Lymphedema 04/10/2016  . Osteoarthritis 03/19/2016  . Osteoporosis 03/19/2016  . Coronary artery calcification seen on CAT scan 02/08/2016  . Incidental lung nodule 02/08/2016  . Thoracic aortic atherosclerosis ([place]) 02/08/2016  . S/P placement of cardiac pacemaker 03/30/2015  . Morbid obesity ([place]) 03/17/2015  . Hypersomnia with sleep apnea 03/17/2015  . Mobitz type 2 second degree AV block 03/16/2015  . Multiple pulmonary nodules 09/13/2014  . Lung field abnormal 09/13/2014  . Barrett's esophagus 12/21/2013  . History of adenomatous polyp of colon 12/21/2013  . Hx of adenomatous colonic polyps 12/21/2013  . Benign essential hypertension 11/30/2013  . Acromioclavicular joint arthritis 11/23/2013   . Glenohumeral arthritis 11/23/2013  . Chest pain 10/21/2013  . Adult body mass index 50.0-59.9 ([place]) 02/09/2013  . Morbid obesity with BMI of 50.0-59.9, adult ([place]) 02/09/2013  . Primary localized osteoarthrosis, lower leg 02/09/2013  . Bronchiectasis ([place]) 05/19/2012  . Chronic bronchitis 12/31/2011  . Cough 12/31/2011  . GERD (gastroesophageal reflux disease) 12/31/2011  . Diabetes mellitus ([place])   . Hypertension     Past Surgical History:  Procedure Laterality Date  . CARPAL TUNNEL RELEASE     right  . CATARACT EXTRACTION     bilateral  . GALLBLADDER SURGERY    . MASTECTOMY Left 1999   BREAST CA  . PACEMAKER INSERTION Right 03/21/2015   Procedure: INSERTION PACEMAKER;  Surgeon: Isaias Cowman, MD;  Location: [facility] ORS;  Service: Cardiovascular;  Laterality: Right;  . TOTAL HIP ARTHROPLASTY  2001   left  . TRIGGER FINGER RELEASE     bilateral    Prior to Admission medications   Medication Sig Start Date End Date Taking? Authorizing Provider  Acetaminophen (TYLENOL EXTRA STRENGTH PO) Take by mouth.    Historical Provider, MD  ALPRAZolam Duanne Moron) 0.5 MG tablet Take 1 tablet (0.5 mg total) by mouth 3 (three) times daily as needed for anxiety. 03/22/15   Adin Hector, MD  AMBULATORY NON FORMULARY MEDICATION Medication Name: incentive spirometry use 10-15 times daily. Dx:J47.9 10/14/16   Flora Lipps, MD  azithromycin (ZITHROMAX) 250 MG tablet  05/20/16   Historical Provider, MD  Blood Glucose Monitoring Suppl (FIFTY50 GLUCOSE METER 2.0) w/Device KIT Use as directed.  Bayer Contour glucometer Dx E11.8 05/07/16 05/07/17  Historical Provider, MD  conjugated estrogens (PREMARIN) vaginal cream Place 1 Applicatorful vaginally daily. Apply 0.55m (pea-sized amount)  just inside the vaginal introitus with a finger-tip every night for two weeks and then Monday, Wednesday and Friday nights. 12/27/15   SNori Riis PA-C  doxycycline (VIBRAMYCIN) 100 MG capsule  06/05/16   Historical  Provider, MD  fluticasone (FLONASE) 50 MCG/ACT nasal spray Place 2 sprays into both nostrils daily. Patient taking differently: Place 2 sprays into both nostrils daily as needed for rhinitis.  03/04/14 08/13/15  DJuanito Doom MD  glucose blood (BAYER CONTOUR TEST) test strip Use one strip to check glucose three times daily 04/04/16   Historical Provider, MD  insulin lispro protamine-lispro (HUMALOG 75/25 MIX) (75-25) 100 UNIT/ML SUSP injection Inject 15 Units into the skin 2 (two) times daily. Patient taking differently: Inject 10-12 Units into the skin 2 (two) times daily.  03/22/15   BAdin Hector MD  KLOR-CON M20 20 MEQ tablet Reported on 09/21/2015 06/25/15   Historical Provider, MD  lidocaine (LIDODERM) 5 % Place 2 patches onto the skin daily. Remove & Discard patch within 12 hours or as directed by MD    Historical Provider, MD  losartan (COZAAR) 25 MG tablet Take 25 mg by mouth daily.    Historical Provider, MD  mometasone (ELOCON) 0.1 % lotion Reported on 12/05/2015 11/08/15   Historical Provider, MD  Multiple Vitamin (MULTIVITAMIN WITH MINERALS) TABS tablet Take 1 tablet by mouth daily.    Historical Provider, MD  mupirocin ointment (BACTROBAN) 2 % Reported on 12/05/2015 11/08/15   Historical Provider, MD  omeprazole (PRILOSEC) 20 MG capsule Take 1 capsule (20 mg total) by mouth 2 (two) times daily. 03/21/14   DJuanito Doom MD  Respiratory Therapy Supplies (FLUTTER) DEVI Use as directed 02/12/16   KFlora Lipps MD  Simethicone (MYLANTA GAS PO) Take by mouth.    Historical Provider, MD  simvastatin (ZOCOR) 20 MG tablet Take 20 mg by mouth daily.    Historical Provider, MD  spironolactone (ALDACTONE) 25 MG tablet Take 25 mg by mouth every other day.     Historical Provider, MD  torsemide (DEMADEX) 20 MG tablet  05/20/16   Historical Provider, MD  traMADol (ULTRAM) 50 MG tablet Take by mouth. 10/26/15   Historical Provider, MD  triamcinolone ointment (KENALOG) 0.1 % Apply twice a day to  affected areas 04/04/16   Historical Provider, MD    Allergies Aspirin; Celebrex [celecoxib]; Meloxicam; Nsaids; Rofecoxib; Tolmetin; Bactrim [sulfamethoxazole-trimethoprim]; Biguanide copolymer; Buprenorphine hcl; Ciprofloxacin; Codeine; Delsym [dextromethorphan polistirex er]; Dextromethorphan hbr; Furosemide; Hydrocodone; Metformin; Morphine and related; Penicillins; Pregabalin; Promethazine; Propoxyphene; Quinolones; Seroquel [quetiapine fumarate]; Serotonin reuptake inhibitors (ssris); Sulfa antibiotics; Tramadol; and Ultracet [tramadol-acetaminophen]  Family History  Problem Relation Age of Onset  . Heart disease Paternal Grandmother   . Diabetes Paternal Grandmother   . Heart disease Father   . Heart disease Mother   . Heart disease Maternal Grandmother   . Stroke Maternal Aunt   . Breast cancer Paternal Aunt 94  . Lung cancer Brother 644 . Kidney disease Cousin   . Bladder Cancer Neg Hx     Social History Social History  Substance Use Topics  . Smoking status: Former Smoker    Packs/day: 1.00    Years: 30.00    Types: Cigarettes    Quit date: 12/27/1992  . Smokeless tobacco: Never Used  . Alcohol use No    Review  of Systems Constitutional: No fever/chills Eyes: No visual changes. ENT: No sore throat. Cardiovascular: Denies chest pain. Respiratory: Denies shortness of breath. Gastrointestinal: No abdominal pain.  No nausea, no vomiting.  No diarrhea.   Genitourinary: Negative for dysuria. Musculoskeletal: Negative for back pain. Skin: Negative for rash. Neurological: Positive for slurred speech, vision change.   ____________________________________________   PHYSICAL EXAM:  VITAL SIGNS: ED Triage Vitals  Enc Vitals Group     BP 10/14/16 1650 (!) 178/44     Pulse Rate 10/14/16 1650 84     Resp 10/14/16 1650 18     Temp 10/14/16 1650 98 F (36.7 C)     Temp Source 10/14/16 1650 Oral     SpO2 10/14/16 1650 94 %     Weight 10/14/16 1651 261 lb (118.4 kg)      Height 10/14/16 1651 5' (1.524 m)   Constitutional: Alert and oriented. Well appearing and in no distress. Eyes: Conjunctivae are normal. Normal extraocular movements. ENT   Head: Normocephalic and atraumatic.   Nose: No congestion/rhinnorhea.   Mouth/Throat: Mucous membranes are moist.   Neck: No stridor. Hematological/Lymphatic/Immunilogical: No cervical lymphadenopathy. Cardiovascular: Normal rate, regular rhythm.  No murmurs, rubs, or gallops.  Respiratory: Normal respiratory effort without tachypnea nor retractions. Breath sounds are clear and equal bilaterally. No wheezes/rales/rhonchi. Gastrointestinal: Soft and non tender. No rebound. No guarding.  Genitourinary: Deferred Musculoskeletal: Normal range of motion in all extremities. No lower extremity edema. Neurologic:  Normal speech and language. No gross focal neurologic deficits are appreciated.  Skin:  Skin is warm, dry and intact. No rash noted. Psychiatric: Mood and affect are normal. Speech and behavior are normal. Patient exhibits appropriate insight and judgment.  ____________________________________________    LABS (pertinent positives/negatives)  Labs Reviewed  BASIC METABOLIC PANEL - Abnormal; Notable for the following:       Result Value   Glucose, Bld 194 (*)    BUN 27 (*)    Creatinine, Ser 1.12 (*)    GFR calc non Af Amer 45 (*)    GFR calc Af Amer 52 (*)    All other components within normal limits  URINALYSIS, COMPLETE (UACMP) WITH MICROSCOPIC - Abnormal; Notable for the following:    Color, Urine YELLOW (*)    APPearance CLEAR (*)    Bacteria, UA RARE (*)    Squamous Epithelial / LPF 0-5 (*)    All other components within normal limits  CBC  APTT  PROTIME-INR  CBG MONITORING, ED     ____________________________________________   EKG  I, Nance Pear, attending physician, personally viewed and interpreted this EKG  EKG Time: 1655 Rate: 84 Rhythm: atrial sensed  ventricular paced rhythm Axis: left axis deviation Intervals: qtc 508 QRS: wide ST changes: no st elevation equivalent Impression: abnormal ekg  ____________________________________________    RADIOLOGY  CT head IMPRESSION: 1. No acute intracranial pathology seen on CT. 2. Mild cortical volume loss and scattered small vessel ischemic microangiopathy. 3. 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 ____________________________________________   PROCEDURES  Procedures  ____________________________________________   INITIAL IMPRESSION / ASSESSMENT AND PLAN / ED COURSE  Pertinent labs & imaging results that were available during my care of the patient were reviewed by me and considered in my medical decision making (see chart for details).  Patient presents to the emergency department sent by primary care doctor's office Because of concerns for TIA. Patient without symptoms at this time. CT head negative. Will plan admission for  further TIA workup.  ____________________________________________   FINAL CLINICAL IMPRESSION(S) / ED DIAGNOSES  Final diagnoses:  TIA (transient ischemic attack)     Note: This dictation was prepared with Dragon dictation. Any transcriptional errors that result from this process are unintentional     Nance Pear, MD 10/14/16 2038

## 2016-10-14 NOTE — ED Triage Notes (Signed)
Pt in via POV; pt states, "I think I may have had a TIA on Friday, I was telling my pulmonologist about it today and he sent me over here."  Pt reports around 1500 on Friday, feeling light headed, blurred vision, and having trouble finding her words, pt reports episode lasting approximately 10 minutes.  Pt with hx of TIA, pt denies being on blood thinners.  Pt A/Ox4, denies any complaints at this time.

## 2016-10-14 NOTE — Patient Instructions (Signed)
Incentive spirometry 10-15 times per day Use oxygen with movement and at night

## 2016-10-14 NOTE — H&P (Signed)
[facility] NAME: Teresa Krueger    MR#:  [id number]  DATE OF BIRTH:  1933-12-15  DATE OF ADMISSION:  10/14/2016  PRIMARY CARE PHYSICIAN: Adin Hector, MD   REQUESTING/REFERRING PHYSICIAN: Nance Pear MD  CHIEF COMPLAINT:   Chief Complaint  Patient presents with  . Transient Ischemic Attack    HISTORY OF PRESENT ILLNESS: Teresa Krueger  is a 83 y.o. female with a known history of  Arthritis, asthma, COPD, bronchiectasis, diabetes, hypercholesterolemia, essential hypertension, mini strokes in the past with pacemaker placement who was referred by the pulmonologist to the emergency room because patient had TIA like symptoms on Friday. Patient reports that she had a episode where for 10 minutes she could not see well as well as things that did not make sense to her when she was trying to talk to her husband. Since then the symptoms resolved. She also reports that she had a echocardiogram done last week. Patient has chronic cough related to bronchiectasis Past Medical History:  Diagnosis Date  . Arthritis   . Asthma   . Barrett's esophagus   . Breast cancer ([place]) 1999   LT MASTECTOMY  . Bronchiectasis ([place])   . COPD (chronic obstructive pulmonary disease) ([place])   . Diabetes mellitus ([place])   . Hypercholesterolemia   . Hypertension   . Lymphedema   . Mini stroke ([place])   . Osteoporosis   . Pacemaker     PAST SURGICAL HISTORY: Past Surgical History:  Procedure Laterality Date  . CARPAL TUNNEL RELEASE     right  . CATARACT EXTRACTION     bilateral  . GALLBLADDER SURGERY    . MASTECTOMY Left 1999   BREAST CA  . PACEMAKER INSERTION Right 03/21/2015   Procedure: INSERTION PACEMAKER;  Surgeon: Isaias Cowman, MD;  Location: [facility] ORS;  Service: Cardiovascular;  Laterality: Right;  . TOTAL HIP ARTHROPLASTY  2001   left  . TRIGGER FINGER RELEASE     bilateral    SOCIAL HISTORY:  Social History  Substance Use Topics   . Smoking status: Former Smoker    Packs/day: 1.00    Years: 30.00    Types: Cigarettes    Quit date: 12/27/1992  . Smokeless tobacco: Never Used  . Alcohol use No    FAMILY HISTORY:  Family History  Problem Relation Age of Onset  . Heart disease Paternal Grandmother   . Diabetes Paternal Grandmother   . Heart disease Father   . Heart disease Mother   . Heart disease Maternal Grandmother   . Stroke Maternal Aunt   . Breast cancer Paternal Aunt 94  . Lung cancer Brother 76  . Kidney disease Cousin   . Bladder Cancer Neg Hx     DRUG ALLERGIES:  Allergies  Allergen Reactions  . Aspirin Swelling and Other (See Comments)    Pt states that she has tongue swelling.    . Celebrex [Celecoxib] Anaphylaxis  . Meloxicam Swelling  . Nsaids Anaphylaxis  . Rofecoxib Anaphylaxis  . Tolmetin Anaphylaxis  . Bactrim [Sulfamethoxazole-Trimethoprim] Hives and Itching  . Biguanide Copolymer Other (See Comments)    Reaction:  Unknown   . Buprenorphine Hcl Other (See Comments)    Reaction:  Unknown   . Ciprofloxacin Other (See Comments)    Reaction:  Unknown   . Codeine Nausea And Vomiting and Other (See Comments)    Reaction:  Syncope   . Delsym [Dextromethorphan Polistirex Er] Other (See Comments)  Reaction:  GI upset   . Dextromethorphan Hbr Other (See Comments)    Reaction:  GI upset   . Furosemide Other (See Comments)    Reaction:  Unknown   . Hydrocodone Other (See Comments)    Reaction:  Unknown   . Metformin Other (See Comments)    Reaction:  Unknown   . Morphine And Related Other (See Comments)    Reaction:  Unknown   . Penicillins Swelling and Other (See Comments)    Pt did not answer the additional questions for this medication because she does not remember.    . Pregabalin Other (See Comments)    Reaction:  Unknown   . Promethazine Other (See Comments)    Reaction:  Unknown   . Propoxyphene Other (See Comments)    Reaction:  Unknown   . Quinolones Other (See  Comments)    Reaction:  Unknown   . Seroquel [Quetiapine Fumarate] Other (See Comments)    Reaction:  Unknown   . Serotonin Reuptake Inhibitors (Ssris) Other (See Comments)    Reaction:  Unknown   . Sulfa Antibiotics Hives and Itching  . Tramadol Nausea Only  . Ultracet [Tramadol-Acetaminophen] Other (See Comments)    Reaction:  Unknown     REVIEW OF SYSTEMS:   CONSTITUTIONAL: No fever, fatigue or weakness.  EYES: No blurred or double vision.  EARS, NOSE, AND THROAT: No tinnitus or ear pain.  RESPIRATORY: Positive cough, positive shortness of breath, no wheezing or hemoptysis.  CARDIOVASCULAR: No chest pain, orthopnea, edema.  GASTROINTESTINAL: No nausea, vomiting, diarrhea or abdominal pain.  GENITOURINARY: No dysuria, hematuria.  ENDOCRINE: No polyuria, nocturia,  HEMATOLOGY: No anemia, easy bruising or bleeding SKIN: No rash or lesion. MUSCULOSKELETAL: No joint pain or arthritis.   NEUROLOGIC: No tingling, numbness, weakness.  PSYCHIATRY: No anxiety or depression.   MEDICATIONS AT HOME:  Prior to Admission medications   Medication Sig Start Date End Date Taking? Authorizing Provider  Acetaminophen (TYLENOL EXTRA STRENGTH PO) Take by mouth.    Historical Provider, MD  ALPRAZolam Duanne Moron) 0.5 MG tablet Take 1 tablet (0.5 mg total) by mouth 3 (three) times daily as needed for anxiety. 03/22/15   Adin Hector, MD  AMBULATORY NON FORMULARY MEDICATION Medication Name: incentive spirometry use 10-15 times daily. Dx:J47.9 10/14/16   Flora Lipps, MD  azithromycin (ZITHROMAX) 250 MG tablet  05/20/16   Historical Provider, MD  Blood Glucose Monitoring Suppl (FIFTY50 GLUCOSE METER 2.0) w/Device KIT Use as directed. Bayer Contour glucometer Dx E11.8 05/07/16 05/07/17  Historical Provider, MD  conjugated estrogens (PREMARIN) vaginal cream Place 1 Applicatorful vaginally daily. Apply 0.69m (pea-sized amount)  just inside the vaginal introitus with a finger-tip every night for two weeks and  then Monday, Wednesday and Friday nights. 12/27/15   SNori Riis PA-C  doxycycline (VIBRAMYCIN) 100 MG capsule  06/05/16   Historical Provider, MD  fluticasone (FLONASE) 50 MCG/ACT nasal spray Place 2 sprays into both nostrils daily. Patient taking differently: Place 2 sprays into both nostrils daily as needed for rhinitis.  03/04/14 08/13/15  DJuanito Doom MD  glucose blood (BAYER CONTOUR TEST) test strip Use one strip to check glucose three times daily 04/04/16   Historical Provider, MD  insulin lispro protamine-lispro (HUMALOG 75/25 MIX) (75-25) 100 UNIT/ML SUSP injection Inject 15 Units into the skin 2 (two) times daily. Patient taking differently: Inject 10-12 Units into the skin 2 (two) times daily.  03/22/15   BAdin Hector MD  KLOR-CON M20  20 MEQ tablet Reported on 09/21/2015 06/25/15   Historical Provider, MD  lidocaine (LIDODERM) 5 % Place 2 patches onto the skin daily. Remove & Discard patch within 12 hours or as directed by MD    Historical Provider, MD  losartan (COZAAR) 25 MG tablet Take 25 mg by mouth daily.    Historical Provider, MD  mometasone (ELOCON) 0.1 % lotion Reported on 12/05/2015 11/08/15   Historical Provider, MD  Multiple Vitamin (MULTIVITAMIN WITH MINERALS) TABS tablet Take 1 tablet by mouth daily.    Historical Provider, MD  mupirocin ointment (BACTROBAN) 2 % Reported on 12/05/2015 11/08/15   Historical Provider, MD  omeprazole (PRILOSEC) 20 MG capsule Take 1 capsule (20 mg total) by mouth 2 (two) times daily. 03/21/14   Juanito Doom, MD  Respiratory Therapy Supplies (FLUTTER) DEVI Use as directed 02/12/16   Flora Lipps, MD  Simethicone (MYLANTA GAS PO) Take by mouth.    Historical Provider, MD  simvastatin (ZOCOR) 20 MG tablet Take 20 mg by mouth daily.    Historical Provider, MD  spironolactone (ALDACTONE) 25 MG tablet Take 25 mg by mouth every other day.     Historical Provider, MD  torsemide (DEMADEX) 20 MG tablet  05/20/16   Historical Provider, MD   traMADol (ULTRAM) 50 MG tablet Take by mouth. 10/26/15   Historical Provider, MD  triamcinolone ointment (KENALOG) 0.1 % Apply twice a day to affected areas 04/04/16   Historical Provider, MD      PHYSICAL EXAMINATION:   VITAL SIGNS: Blood pressure (!) 157/44, pulse 86, temperature 98 F (36.7 C), temperature source Oral, resp. rate 13, height 5' (1.524 m), weight 261 lb (118.4 kg), SpO2 94 %.  GENERAL:  83 y.o.-year-old patient lying in the bed with no acute distress.  EYES: Pupils equal, round, reactive to light and accommodation. No scleral icterus. Extraocular muscles intact.  HEENT: Head atraumatic, normocephalic. Oropharynx and nasopharynx clear.  NECK:  Supple, no jugular venous distention. No thyroid enlargement, no tenderness.  LUNGS: Normal breath sounds bilaterally, no wheezing, rales,rhonchi or crepitation. No use of accessory muscles of respiration.  CARDIOVASCULAR: S1, S2 normal. No murmurs, rubs, or gallops.  ABDOMEN: Soft, nontender, nondistended. Bowel sounds present. No organomegaly or mass.  EXTREMITIES: No pedal edema, cyanosis, or clubbing.  NEUROLOGIC: Cranial nerves II through XII are intact. Muscle strength 5/5 in all extremities. Sensation intact. Gait not checked.  PSYCHIATRIC: The patient is alert and oriented x 3.  SKIN: No obvious rash, lesion, or ulcer.   LABORATORY PANEL:   CBC  Recent Labs Lab 10/14/16 1656  WBC 4.6  HGB 12.4  HCT 37.7  PLT 198  MCV 87.8  MCH 29.0  MCHC 33.0  RDW 13.6   ------------------------------------------------------------------------------------------------------------------  Chemistries   Recent Labs Lab 10/14/16 1656  NA 137  K 4.8  CL 103  CO2 27  GLUCOSE 194*  BUN 27*  CREATININE 1.12*  CALCIUM 9.4   ------------------------------------------------------------------------------------------------------------------ estimated creatinine clearance is 46.5 mL/min (A) (by C-G formula based on SCr of 1.12  mg/dL (H)). ------------------------------------------------------------------------------------------------------------------ No results for input(s): TSH, T4TOTAL, T3FREE, THYROIDAB in the last 72 hours.  Invalid input(s): FREET3   Coagulation profile  Recent Labs Lab 10/14/16 1656  INR 1.09   ------------------------------------------------------------------------------------------------------------------- No results for input(s): DDIMER in the last 72 hours. -------------------------------------------------------------------------------------------------------------------  Cardiac Enzymes No results for input(s): CKMB, TROPONINI, MYOGLOBIN in the last 168 hours.  Invalid input(s): CK ------------------------------------------------------------------------------------------------------------------ Invalid input(s): POCBNP  ---------------------------------------------------------------------------------------------------------------  Urinalysis    Component Value  Date/Time   COLORURINE YELLOW (A) 10/14/2016 1656   APPEARANCEUR CLEAR (A) 10/14/2016 1656   APPEARANCEUR Clear 12/05/2015 1424   LABSPEC 1.008 10/14/2016 1656   LABSPEC 1.014 11/08/2011 2010   PHURINE 7.0 10/14/2016 1656   GLUCOSEU NEGATIVE 10/14/2016 1656   GLUCOSEU Negative 11/08/2011 2010   HGBUR NEGATIVE 10/14/2016 1656   BILIRUBINUR NEGATIVE 10/14/2016 1656   BILIRUBINUR Negative 12/05/2015 1424   BILIRUBINUR Negative 11/08/2011 2010   KETONESUR NEGATIVE 10/14/2016 1656   PROTEINUR NEGATIVE 10/14/2016 1656   NITRITE NEGATIVE 10/14/2016 1656   LEUKOCYTESUR NEGATIVE 10/14/2016 1656   LEUKOCYTESUR 2+ (A) 12/05/2015 1424   LEUKOCYTESUR 3+ 11/08/2011 2010     RADIOLOGY: Ct Head Wo Contrast  Result Date: 10/14/2016 CLINICAL DATA:  Acute onset of lightheadedness and blurred vision. Aphasia. Initial encounter. EXAM: CT HEAD WITHOUT CONTRAST TECHNIQUE: Contiguous axial images were obtained from the base  of the skull through the vertex without intravenous contrast. COMPARISON:  CT of the head performed 11/22/2006, and MRI of the brain performed 03/30/2013 FINDINGS: Brain: No evidence of acute infarction, hemorrhage, hydrocephalus, extra-axial collection or mass lesion/mass effect. Prominence of the ventricles and sulci reflects mild cortical volume loss. Mild cerebellar atrophy is noted. Mild periventricular and subcortical white matter change likely reflects small vessel ischemic microangiopathy. The brainstem and fourth ventricle are within normal limits. The basal ganglia are unremarkable in appearance. The cerebral hemispheres demonstrate grossly normal gray-white differentiation. No mass effect or midline shift is seen. Vascular: No hyperdense vessel or unexpected calcification. Skull: There is no evidence of fracture; a likely 1.2 cm frontal osteoma is noted at the right frontal sinus. Sinuses/Orbits: The orbits are within normal limits. The paranasal sinuses and mastoid air cells are well-aerated. Other: No significant soft tissue abnormalities are seen. IMPRESSION: 1. No acute intracranial pathology seen on CT. 2. Mild cortical volume loss and scattered small vessel ischemic microangiopathy. 3. 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 Electronically Signed   By: Garald Balding M.D.   On: 10/14/2016 17:29    EKG: Orders placed or performed during the hospital encounter of 10/14/16  . ED EKG  . ED EKG  . EKG 12-Lead  . EKG 12-Lead    IMPRESSION AND PLAN: Patient is 83 year old who is presenting to the ED with possible TIA like symptoms on Friday 1. Possible TIA Patient alaready had an echocardiogram of the heart size will not repeat She is allergic to aspirin I'll start her on Plavix We will also obtain carotid Dopplers  2. Diabetes type 2 Place on sliding scale continue home insulin  3. Hyperlipidemia continue home medications  4. Generalized anxiety disorder  continue alprazolam  5. Essential hypertension continue losartan  6. Miscellaneous Lovenox for DVT prophylaxis   All the records are reviewed and case discussed with ED provider. Management plans discussed with the patient, family and they are in agreement.  CODE STATUS: Code Status History    Date Active Date Inactive Code Status Order ID Comments User Context   03/21/2015  4:09 PM 03/22/2015  9:30 PM Full Code [id number]  Isaias Cowman, MD Inpatient   03/16/2015 10:53 PM 03/21/2015  4:09 PM Full Code [id number]  Lytle Butte, MD ED    Advance Directive Documentation     Most Recent Value  Type of Advance Directive  Healthcare Power of [place], Living will  Pre-existing out of facility DNR order (yellow form or pink MOST form)  -  "MOST" Form in Place?  -  TOTAL TIME TAKING CARE OF THIS PATIENT55 minutes.    Dustin Flock M.D on 10/14/2016 at 6:30 PM  Between 7am to 6pm - Pager - [phone]  After 6pm go to [email or web address] - password [email or web address] [facility]  [facility]  Office  [phone]  CC: Primary care physician; Adin Hector, MD

## 2016-10-14 NOTE — Progress Notes (Signed)
Subjective:    Patient ID: Teresa Krueger, female    DOB: [birth year], 83 y.o.   MRN: [id number]  Synopsis: Teresa Krueger first saw the [facility] pulmonary clinic in July 2013 for cough. She has a one pack per day smoking history x30 years and quit in the 1990s. She had simple spirometry in July 2013 which showed no evidence of COPD. She has a history of childhood pertussis  She has postnasal drip and gastroesophageal reflux disease with Barrett's esophagus.   CC: follow up for Bronchiectasis  HPI  No complaints, doing really well, chronic cough and DOE,SOB She had a CT angiogram of her chest feb 2016 recently which was negative for PE but showed some small 33mm nodules.   I also saw bronchiectasis.- repeat CT 07/2015 shows stable nodules- Repeat CT chest 01/2016-shows stable nodules  Uses flutter valve once daily, uses albuterol infrequently No signs of infection at this time SOB/DOE same as before no change She is wheelchair bound S/p pacemaker placement   Review of Systems  Constitutional: Negative for chills, diaphoresis, fatigue and fever.  HENT: Negative for congestion, postnasal drip and rhinorrhea.   Respiratory: Negative for cough, chest tightness, shortness of breath, wheezing and stridor.   Cardiovascular: Positive for leg swelling. Negative for chest pain.  All other systems reviewed and are negative.     Objective:   Physical Exam  Constitutional: She appears well-developed and well-nourished. No distress.  HENT:  Head: Normocephalic and atraumatic.  Eyes: Pupils are equal, round, and reactive to light.  Neck: Normal range of motion. Neck supple.  Cardiovascular: Normal rate and regular rhythm.   No murmur heard. Pulmonary/Chest: Effort normal and breath sounds normal. No respiratory distress. She has no wheezes. She has no rales.  Musculoskeletal: She exhibits edema.  Skin: She is not diaphoretic.   BP 132/80 (BP Location: Right Arm, Patient  Position: Sitting, Cuff Size: Normal)   Pulse 73   Ht 5' (1.524 m)   Wt 261 lb (118.4 kg)   SpO2 95%   BMI 50.97 kg/m    04/2012 CT chest >> no emphysema, there is some bronchiectasis in the RML and Lingula ([place] read) 04/2012 CT sinuses >> no significant signs of sinusitis 04/2012 CT chest [facility] >> no emphysema or nodules, mild RML and lingula bronchiectasis  04/2012 AFB culture x3 negative February 2016 CT angiogram chest> mild lingular bronchiectasis, scattered pulmonary nodules bilaterally, largest is 6 mm, no pulmonary embolism Feb 2017 CTchest stabel nodules, recommend repeat in 6 months. 01/2016 Ct chest 1. Stable noncalcified subpleural nodule in the periphery of the right upper lobe.  2. Moderate thoracic aortic atherosclerosis. 3. Calcifications in the distribution of the left anterior descending and right coronary arteries.   Assessment & Plan:  83 yo obese white female with Bronchiectasis that seems to be stable at this time with chronic hypoxic resp failure  1.Bronchiectasis -mild bronchiectasis. She has not had an exacerbation recently.  -advised to use flutter valve more frequently during the day -continue oxygen as needed at night -incentive spirometry 10-15 times per day  2.Multiple pulmonary nodules She was noted to have multiple pulmonary nodules on her  CT chest. None of these are larger than 6 mm 1 year ago. -She has a heavy smoking history but has been greater than 30 years and she quit smoking.  -repeat Ct chest shows stable nodules will repeat in 1 year.  Follow up in 6 months  Patient/Family are satisfied with Plan of action  and management. All questions answered  Corrin Parker, M.D.  [facility]  Medical Director [place] Director [facility] Cardio-Pulmonary Department

## 2016-10-14 NOTE — Addendum Note (Signed)
Addended by: Devona Konig on: 10/14/2016 03:28 PM   Modules accepted: Orders

## 2016-10-15 ENCOUNTER — Observation Stay: Payer: Medicare Other

## 2016-10-15 DIAGNOSIS — G459 Transient cerebral ischemic attack, unspecified: Secondary | ICD-10-CM | POA: Diagnosis not present

## 2016-10-15 LAB — LIPID PANEL
Cholesterol: 136 mg/dL (ref 0–200)
HDL: 55 mg/dL (ref >40)
LDL CALC: 59 mg/dL (ref 0–99)
TRIGLYCERIDES: 108 mg/dL (ref <150)
Total CHOL/HDL Ratio: 2.5 RATIO
VLDL: 22 mg/dL (ref 0–40)

## 2016-10-15 LAB — GLUCOSE, CAPILLARY
GLUCOSE-CAPILLARY: 201 mg/dL — AB (ref 65–99)
Glucose-Capillary: 103 mg/dL — ABNORMAL HIGH (ref 65–99)
Glucose-Capillary: 158 mg/dL — ABNORMAL HIGH (ref 65–99)

## 2016-10-15 MED ORDER — ENOXAPARIN SODIUM 40 MG/0.4ML ~~LOC~~ SOLN
40.0000 mg | Freq: Two times a day (BID) | SUBCUTANEOUS | Status: DC
  Administered 2016-10-15: 40 mg via SUBCUTANEOUS
  Filled 2016-10-15: qty 0.4

## 2016-10-15 MED ORDER — CLOPIDOGREL BISULFATE 75 MG PO TABS: 75.0000 mg | ORAL_TABLET | Freq: Every day | ORAL | 0 refills | Status: DC

## 2016-10-15 NOTE — Discharge Summary (Signed)
[facility] - [place] at [facility]   PATIENT NAME: Teresa Krueger    MR#:  [id number]  DATE OF BIRTH:  06/18/1934  DATE OF ADMISSION:  10/14/2016 ADMITTING PHYSICIAN: Auburn Bilberry, MD  DATE OF DISCHARGE: 10/15/16  PRIMARY CARE PHYSICIAN: Curtis Sites III, MD    ADMISSION DIAGNOSIS:  TIA (transient ischemic attack) [G45.9]  DISCHARGE DIAGNOSIS:  TIA  SECONDARY DIAGNOSIS:   Past Medical History:  Diagnosis Date  . Arthritis   . Asthma   . Barrett's esophagus   . Breast cancer (HCC) 1999   LT MASTECTOMY  . Bronchiectasis (HCC)   . COPD (chronic obstructive pulmonary disease) (HCC)   . Diabetes mellitus (HCC)   . Hypercholesterolemia   . Hypertension   . Lymphedema   . Mini stroke (HCC)   . Osteoporosis   . Pacemaker     HOSPITAL COURSE:  Teresa Krueger  is a 83 y.o. female with a known history of  Arthritis, asthma, COPD, bronchiectasis, diabetes, hypercholesterolemia, essential hypertension, mini strokes in the past with pacemaker placement who was referred by the pulmonologist to the emergency room because patient had TIA like symptoms on Friday.  1. Suspected TIA Patient alaready had an echocardiogram of the heart size Echo looks ok She is allergic to aspirin now on on Plavix carotid Dopplers 50-69% on left ICA. Pt to see Dr Wyn Quaker as out pt -pt has pacemaker so cannot do MRI Brain  2. Diabetes type 2 Place on sliding scale continue home insulin  3. Hyperlipidemia continue statins  4. Generalized anxiety disorder continue alprazolam  5. Essential hypertension continue losartan  6. Miscellaneous Lovenox for DVT prophylaxis   7. Chronic COPD on chronic home oxygen  Seen by PT-no recommendations Back to baseline D/c home   CONSULTS OBTAINED:    DRUG ALLERGIES:   Allergies  Allergen Reactions  . Aspirin Swelling and Other (See Comments)    Pt states that she has tongue swelling.    . Celebrex [Celecoxib] Anaphylaxis   . Meloxicam Swelling  . Nsaids Anaphylaxis  . Rofecoxib Anaphylaxis  . Tolmetin Anaphylaxis  . Bactrim [Sulfamethoxazole-Trimethoprim] Hives and Itching  . Biguanide Copolymer Other (See Comments)    Reaction:  Unknown   . Buprenorphine Hcl Other (See Comments)    Reaction:  Unknown   . Ciprofloxacin Other (See Comments)    Reaction:  Unknown   . Codeine Nausea And Vomiting and Other (See Comments)    Reaction:  Syncope   . Delsym [Dextromethorphan Polistirex Er] Other (See Comments)    Reaction:  GI upset   . Dextromethorphan Hbr Other (See Comments)    Reaction:  GI upset   . Furosemide Other (See Comments)    Reaction:  Unknown   . Hydrocodone Other (See Comments)    Reaction:  Unknown   . Metformin Other (See Comments)    Reaction:  Unknown   . Morphine And Related Other (See Comments)    Reaction:  Unknown   . Penicillins Swelling and Other (See Comments)    Pt did not answer the additional questions for this medication because she does not remember.    . Pregabalin Other (See Comments)    Reaction:  Unknown   . Promethazine Other (See Comments)    Reaction:  Unknown   . Propoxyphene Other (See Comments)    Reaction:  Unknown   . Quinolones Other (See Comments)    Reaction:  Unknown   . Seroquel [Quetiapine Fumarate] Other (See  Comments)    Reaction:  Unknown   . Serotonin Reuptake Inhibitors (Ssris) Other (See Comments)    Reaction:  Unknown   . Sulfa Antibiotics Hives and Itching  . Tramadol Nausea Only  . Ultracet [Tramadol-Acetaminophen] Other (See Comments)    Reaction:  Unknown     DISCHARGE MEDICATIONS:   Current Discharge Medication List    START taking these medications   Details  clopidogrel (PLAVIX) 75 MG tablet Take 1 tablet (75 mg total) by mouth daily. Qty: 30 tablet, Refills: 0      CONTINUE these medications which have NOT CHANGED   Details  Acetaminophen (TYLENOL EXTRA STRENGTH PO) Take by mouth.    ALPRAZolam (XANAX) 0.5 MG tablet  Take 1 tablet (0.5 mg total) by mouth 3 (three) times daily as needed for anxiety. Qty: 60 tablet, Refills: 0    AMBULATORY NON FORMULARY MEDICATION Medication Name: incentive spirometry use 10-15 times daily. Dx:J47.9 Qty: 1 each, Refills: 0    conjugated estrogens (PREMARIN) vaginal cream Place 1 Applicatorful vaginally daily. Apply 0.'5mg'$  (pea-sized amount)  just inside the vaginal introitus with a finger-tip every night for two weeks and then Monday, Wednesday and Friday nights. Qty: 30 g, Refills: 12    fluticasone (FLONASE) 50 MCG/ACT nasal spray Place 2 sprays into both nostrils daily. Qty: 48 g, Refills: 2    insulin lispro protamine-lispro (HUMALOG 75/25 MIX) (75-25) 100 UNIT/ML SUSP injection Inject 15 Units into the skin 2 (two) times daily. Qty: 10 mL, Refills: 11    KLOR-CON M20 20 MEQ tablet Take 20 mEq by mouth every other day.     lidocaine (LIDODERM) 5 % Place 2 patches onto the skin daily. Remove & Discard patch within 12 hours or as directed by MD    losartan (COZAAR) 25 MG tablet Take 25 mg by mouth daily.    mometasone (ELOCON) 0.1 % lotion Reported on 12/05/2015    Multiple Vitamin (MULTIVITAMIN WITH MINERALS) TABS tablet Take 1 tablet by mouth daily.    mupirocin ointment (BACTROBAN) 2 % Apply 1 application topically daily. Reported on 12/05/2015    omeprazole (PRILOSEC) 20 MG capsule Take 1 capsule (20 mg total) by mouth 2 (two) times daily. Qty: 60 capsule, Refills: 5    Simethicone (MYLANTA GAS PO) Take 1 tablet by mouth daily.     simvastatin (ZOCOR) 20 MG tablet Take 20 mg by mouth daily.    spironolactone (ALDACTONE) 25 MG tablet Take 25 mg by mouth every other day.     torsemide (DEMADEX) 20 MG tablet Take 20 mg by mouth daily.     traMADol (ULTRAM) 50 MG tablet Take 50 mg by mouth every 6 (six) hours as needed.     triamcinolone ointment (KENALOG) 0.1 % Apply twice a day to affected areas    Blood Glucose Monitoring Suppl (FIFTY50 GLUCOSE METER  2.0) w/Device KIT Use as directed. Bayer Contour glucometer Dx E11.8    glucose blood (BAYER CONTOUR TEST) test strip Use one strip to check glucose three times daily    Respiratory Therapy Supplies (FLUTTER) DEVI Use as directed Qty: 1 each, Refills: 0        If you experience worsening of your admission symptoms, develop shortness of breath, life threatening emergency, suicidal or homicidal thoughts you must seek medical attention immediately by calling 911 or calling your MD immediately  if symptoms less severe.  You Must read complete instructions/literature along with all the possible adverse reactions/side effects for all the Medicines you  take and that have been prescribed to you. Take any new Medicines after you have completely understood and accept all the possible adverse reactions/side effects.   Please note  You were cared for by a hospitalist during your hospital stay. If you have any questions about your discharge medications or the care you received while you were in the hospital after you are discharged, you can call the unit and asked to speak with the hospitalist on call if the hospitalist that took care of you is not available. Once you are discharged, your primary care physician will handle any further medical issues. Please note that NO REFILLS for any discharge medications will be authorized once you are discharged, as it is imperative that you return to your primary care physician (or establish a relationship with a primary care physician if you do not have one) for your aftercare needs so that they can reassess your need for medications and monitor your lab values. Today   SUBJECTIVE   No new complaints  VITAL SIGNS:  Blood pressure (!) 159/40, pulse 76, temperature 98.1 F (36.7 C), temperature source Oral, resp. rate 20, height 5' (1.524 m), weight 116.3 kg (256 lb 4.8 oz), SpO2 100 %.  I/O:   Intake/Output Summary (Last 24 hours) at 10/15/16 1436 Last data  filed at 10/15/16 0900  Gross per 24 hour  Intake              240 ml  Output                0 ml  Net              240 ml    PHYSICAL EXAMINATION:  GENERAL:  83 y.o.-year-old patient lying in the bed with no acute distress. obses EYES: Pupils equal, round, reactive to light and accommodation. No scleral icterus. Extraocular muscles intact.  HEENT: Head atraumatic, normocephalic. Oropharynx and nasopharynx clear.  NECK:  Supple, no jugular venous distention. No thyroid enlargement, no tenderness.  LUNGS: Normal breath sounds bilaterally, no wheezing, rales,rhonchi or crepitation. No use of accessory muscles of respiration.  CARDIOVASCULAR: S1, S2 normal. No murmurs, rubs, or gallops.  ABDOMEN: Soft, non-tender, non-distended. Bowel sounds present. No organomegaly or mass.  EXTREMITIES: No pedal edema, cyanosis, or clubbing.  NEUROLOGIC: Cranial nerves II through XII are intact. Muscle strength 5/5 in all extremities. Sensation intact. Gait not checked.  PSYCHIATRIC: The patient is alert and oriented x 3.  SKIN: No obvious rash, lesion, or ulcer.   DATA REVIEW:   CBC   Recent Labs Lab 10/14/16 1656  WBC 4.6  HGB 12.4  HCT 37.7  PLT 198    Chemistries   Recent Labs Lab 10/14/16 1656  NA 137  K 4.8  CL 103  CO2 27  GLUCOSE 194*  BUN 27*  CREATININE 1.12*  CALCIUM 9.4    Microbiology Results   No results found for this or any previous visit (from the past 240 hour(s)).  RADIOLOGY:  Ct Head Wo Contrast  Result Date: 10/14/2016 CLINICAL DATA:  Acute onset of lightheadedness and blurred vision. Aphasia. Initial encounter. EXAM: CT HEAD WITHOUT CONTRAST TECHNIQUE: Contiguous axial images were obtained from the base of the skull through the vertex without intravenous contrast. COMPARISON:  CT of the head performed 11/22/2006, and MRI of the brain performed 03/30/2013 FINDINGS: Brain: No evidence of acute infarction, hemorrhage, hydrocephalus, extra-axial collection or  mass lesion/mass effect. Prominence of the ventricles and sulci reflects mild cortical volume loss.  Mild cerebellar atrophy is noted. Mild periventricular and subcortical white matter change likely reflects small vessel ischemic microangiopathy. The brainstem and fourth ventricle are within normal limits. The basal ganglia are unremarkable in appearance. The cerebral hemispheres demonstrate grossly normal gray-white differentiation. No mass effect or midline shift is seen. Vascular: No hyperdense vessel or unexpected calcification. Skull: There is no evidence of fracture; a likely 1.2 cm frontal osteoma is noted at the right frontal sinus. Sinuses/Orbits: The orbits are within normal limits. The paranasal sinuses and mastoid air cells are well-aerated. Other: No significant soft tissue abnormalities are seen. IMPRESSION: 1. No acute intracranial pathology seen on CT. 2. Mild cortical volume loss and scattered small vessel ischemic microangiopathy. 3. 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 Electronically Signed   By: Garald Balding M.D.   On: 10/14/2016 17:29   US Carotid Bilateral (at [facility] Only)  Result Date: 10/15/2016 CLINICAL DATA:  TIA EXAM: BILATERAL CAROTID DUPLEX ULTRASOUND TECHNIQUE: Pearline Cables scale imaging, color Doppler and duplex ultrasound were performed of bilateral carotid and vertebral arteries in the neck. COMPARISON:  11/22/2006 FINDINGS: Criteria: Quantification of carotid stenosis is based on velocity parameters that correlate the residual internal carotid diameter with NASCET-based stenosis levels, using the diameter of the distal internal carotid lumen as the denominator for stenosis measurement. The following velocity measurements were obtained: RIGHT ICA:  97 cm/sec CCA:  53 cm/sec SYSTOLIC ICA/CCA RATIO:  1.8 DIASTOLIC ICA/CCA RATIO:  2.5 ECA:  364 cm/sec LEFT ICA:  132 cm/sec CCA:  812 cm/sec SYSTOLIC ICA/CCA RATIO:  1.3 DIASTOLIC ICA/CCA RATIO:  2.0 ECA:  157  cm/sec RIGHT CAROTID ARTERY: There is scattered calcified plaque in the common carotid. There is extensive calcified plaque in the bulb which obscures the lumen. Low resistance internal carotid Doppler pattern is preserved. RIGHT VERTEBRAL ARTERY:  Antegrade. LEFT CAROTID ARTERY: There is prominent calcified and irregular plaque in the common carotid which narrows the lumen. There is extensive calcified plaque in the bulb which shadows the lumen somewhat. Low resistance internal carotid Doppler pattern. LEFT VERTEBRAL ARTERY:  Antegrade. IMPRESSION: Less than 50% stenosis in the right internal carotid artery and 50-69% stenosis in the left internal carotid artery is based on determined peak systolic velocities. There is extensive calcified plaque in both bulbs which obscures the lumen, therefore true narrowing may be much greater. Consider CTA or MRA of the neck to further characterize. Electronically Signed   By: Marybelle Killings M.D.   On: 10/15/2016 11:29     Management plans discussed with the patient, family and they are in agreement.  CODE STATUS:     Code Status Orders        Start     Ordered   10/14/16 2249  Full code  Continuous     10/14/16 2248    Code Status History    Date Active Date Inactive Code Status Order ID Comments User Context   03/21/2015  4:09 PM 03/22/2015  9:30 PM Full Code [id number]  Isaias Cowman, MD Inpatient   03/16/2015 10:53 PM 03/21/2015  4:09 PM Full Code [id number]  Lytle Butte, MD ED    Advance Directive Documentation     Most Recent Value  Type of Advance Directive  Living will, Healthcare Power of Attorney  Pre-existing out of facility DNR order (yellow form or pink MOST form)  -  "MOST" Form in Place?  -      [place]  PATIENT: *40* minutes.    Refugio Mcconico M.D on 10/15/2016 at 2:36 PM  Between 7am to 6pm - Pager - [phone] After 6pm go to [email or web address] - password [email or web address] [facility]  Office   [phone]  CC: Primary care physician; Adin Hector, MD

## 2016-10-15 NOTE — Evaluation (Signed)
Physical Therapy Evaluation Patient Details Name: Teresa Krueger MRN: [id number] DOB: 11-10-1933 Today's Date: 10/15/2016   History of Present Illness  83 y.o. female with a known history of  Arthritis, asthma, COPD, bronchiectasis, diabetes, hypercholesterolemia, essential hypertension, mini strokes in the past with pacemaker placement who was referred by the pulmonologist to the emergency room because patient had TIA like symptoms on Friday  Clinical Impression  Pt reports that she feels that she is back to her baseline in all regards (mobility, speaking, LOC, etc) and she is ready to go home.  She states she does not normally do more than 30 ft of ambulation with walker and that what she did today felt like her normal. She is not interested in HHPT and reports she will continue to her daily activity - will keep on PT list here in the hospital to insure she does not functionally decline while here.     Follow Up Recommendations No PT follow up    Equipment Recommendations  None recommended by PT    Recommendations for Other Services       Precautions / Restrictions Precautions Precautions: Fall Restrictions Weight Bearing Restrictions: No      Mobility  Bed Mobility Overal bed mobility: Modified Independent             General bed mobility comments: Pt slow with transition to EOB and needed bed rails, but ultimately got to sitting w/o assist  Transfers Overall transfer level: Modified independent Equipment used: Rolling walker (2 wheeled)             General transfer comment: PT was close and gave cuing assist, but pt able to rise to standing using walker with good relative confidence  Ambulation/Gait Ambulation/Gait assistance: Supervision Ambulation Distance (Feet): 30 Feet Assistive device: Rolling walker (2 wheeled)       General Gait Details: Pt was able to ambulate in-room w/o LOBs or safety issues.  She did have some fatigue and reports that she does  not normally go much more than this, but that she felt close to her baseline.  Stairs            Wheelchair Mobility    Modified Rankin (Stroke Patients Only)       Balance Overall balance assessment: Modified Independent                                           Pertinent Vitals/Pain Pain Assessment: No/denies pain (no acute pain, only chronic LE pain)    Home Living Family/patient expects to be discharged to:: Private residence Living Arrangements: Spouse/significant other Available Help at Discharge: Family   Home Access: Ramped entrance       Home Equipment: Walker - 4 wheels;Grab bars - tub/shower;Wheelchair - power      Prior Function Level of Independence: Independent with assistive device(s)         Comments: Pt reports that with w/c she is able to be relatively active     Hand Dominance        Extremity/Trunk Assessment   Upper Extremity Assessment Upper Extremity Assessment: Generalized weakness (at baseline)    Lower Extremity Assessment Lower Extremity Assessment: Generalized weakness (at baseline)       Communication   Communication: No difficulties  Cognition Arousal/Alertness: Awake/alert Behavior During Therapy: WFL for tasks assessed/performed Overall Cognitive Status: Within Functional Limits for tasks  assessed                                        General Comments      Exercises     Assessment/Plan    PT Assessment Patient needs continued PT services  PT Problem List Decreased strength;Decreased activity tolerance;Decreased balance;Decreased mobility;Decreased safety awareness;Decreased knowledge of use of DME       PT Treatment Interventions DME instruction;Gait training;Functional mobility training;Therapeutic activities;Therapeutic exercise;Balance training;Neuromuscular re-education;Patient/family education;Wheelchair mobility training    PT Goals (Current goals can be found in  the Care Plan section)  Acute Rehab PT Goals Patient Stated Goal: go home PT Goal Formulation: With patient Time For Goal Achievement: 10/29/16 Potential to Achieve Goals: Good    Frequency Min 2X/week   Barriers to discharge        Co-evaluation               End of Session Equipment Utilized During Treatment: Gait belt;Oxygen Activity Tolerance: Patient limited by fatigue Patient left: with call bell/phone within reach;with nursing/sitter in room;in chair   PT Visit Diagnosis: Muscle weakness (generalized) (M62.81);Difficulty in walking, not elsewhere classified (R26.2)    Time: 3612-2449 PT Time Calculation (min) (ACUTE ONLY): 24 min   Charges:   PT Evaluation $PT Eval Low Complexity: 1 Procedure     PT G Codes:   PT G-Codes **NOT FOR INPATIENT CLASS** Functional Assessment Tool Used: AM-PAC 6 Clicks Basic Mobility Functional Limitation: Mobility: Walking and moving around Mobility: Walking and Moving Around Current Status (P5300): At least 20 percent but less than 40 percent impaired, limited or restricted Mobility: Walking and Moving Around Goal Status [phone]): At least 1 percent but less than 20 percent impaired, limited or restricted    Kreg Shropshire, DPT 10/15/2016, 11:40 AM

## 2016-10-15 NOTE — Care Management (Signed)
Admitted to this facility with the diagnosis of TIA under observation status. Lives with husband, Joneen Boers, [phone]). Son is Scientist, physiological [phone]). Last seen Dr. Ramonita Lab in February 2018. Prescriptions are filled at [facility] on [address]. Home Health in the past, doesn't remember name of agency. Skilled nursing per [facility] for hip replacement 2001 and following pacer placement 2016. Home oxygen per [facility] 2 liters per nasal cannula continuous since February 2017. Recliner, chair lift, rollator, power wheelchair, bath bench, raised toilet seat, gran bars in tub, med line, and flutter valve in the home.  Takes care of all basic activities of daily living herself, Husband drives handicap Lucianne Lei. Last fall was 2 years ago. Decreased appetite. Lost 45 pounds since August 2017. Husband will transport. Shelbie Ammons RN MSN CCM Care Management [phone]

## 2016-10-15 NOTE — Progress Notes (Signed)
Order for enoxaparin 40 mg subcutaneously daily changed to 40 mg BID per anticoagulation protocol for CrCl > 30 mL/min and BMI > 40.  Lenis Noon, PharmD 10/15/16 8:41 AM

## 2016-10-15 NOTE — Evaluation (Signed)
Occupational Therapy Evaluation Patient Details Name: Teresa Krueger MRN: [id number] DOB: [birth year] Today's Date: 10/15/2016    History of Present Illness 83 y.o. female with a known history of  Arthritis, asthma, COPD, bronchiectasis, diabetes, hypercholesterolemia, essential hypertension, mini strokes in the past with pacemaker placement who was referred by the pulmonologist to the emergency room because patient had TIA like symptoms on Friday   Clinical Impression   Pt seen for OT evaluation this date. Pt reports all symptoms have resolved, feels back to baseline regarding functional mobility, strength, and self care tasks, and is eager to return home with spouse. Pt seated EOB for length of session with good sitting balance and activity tolerance with good O2 sats throughout and no SOB noted. Pt educated in compensatory strategies for stocking mgt, LB dressing, home/routines modifications and energy conservation strategies to support falls prevention, safety, and functional independence in the home. Both pt and spouse have life alert pendants and endorse wearing them at all times. Pt and spouse report good use of strategies in place at home and while out in community to manage energy levels, minimize risk for falls, and maintain independence. No OT follow up recommended. Pt and spouse feel confident in return home with no therapy needs at this time. Thankful for education provided by OT during session. Will keep pt on OT caseload here in hospital to ensure no functional decline until discharge.     Follow Up Recommendations  No OT follow up    Equipment Recommendations  None recommended by OT    Recommendations for Other Services       Precautions / Restrictions Precautions Precautions: Fall Restrictions Weight Bearing Restrictions: No      Mobility Bed Mobility Overal bed mobility: Modified Independent             General bed mobility comments: additional time to perform,  using bed rails, but no assist  Transfers                 General transfer comment: not assessed, pt sitting EOB for session, eager to be discharged    Balance Overall balance assessment: Modified Independent (pt able to sit EOB with B feet supported, no UE support and no LOB)                                         ADL either performed or assessed with clinical judgement   ADL Overall ADL's : At baseline;Modified independent                                       General ADL Comments: pt able to perform all self care tasks at baseline modified indep level     Vision   Eye Alignment: Within Functional Limits Additional Comments: no apparent visual deficits     Perception     Praxis      Pertinent Vitals/Pain Pain Assessment: No/denies pain (no acute pain, 5/10 chronic pain from arthritis)     Hand Dominance Right   Extremity/Trunk Assessment Upper Extremity Assessment Upper Extremity Assessment: Generalized weakness (at baseline)   Lower Extremity Assessment Lower Extremity Assessment: Generalized weakness (at baseline)   Cervical / Trunk Assessment Cervical / Trunk Assessment: Kyphotic   Communication Communication Communication: No difficulties   Cognition Arousal/Alertness: Awake/alert Behavior During  Therapy: WFL for tasks assessed/performed Overall Cognitive Status: Within Functional Limits for tasks assessed                                     General Comments       Exercises Other Exercises Other Exercises: pt/spouse educated in benefits of pursed lip breathing, home/routines modifications, and energy conservation strategies to support functional independence in light of chronic disease mgt, and falls prevention   Shoulder Instructions      Home Living Family/patient expects to be discharged to:: Private residence Living Arrangements: Spouse/significant other Available Help at Discharge:  Family;Available 24 hours/day Type of Home: House Home Access: Ramped entrance     Home Layout: One level (ramps within home per prior encounter)     Bathroom Shower/Tub: Tub/shower unit;Curtain   Bathroom Toilet: Handicapped height Bathroom Accessibility: Yes How Accessible: Accessible via walker Home Equipment: Albia - 4 wheels;Grab bars - tub/shower;Grab bars - toilet;Tub bench;Wheelchair - power;Hand held shower head;Adaptive equipment Adaptive Equipment: Reacher        Prior Functioning/Environment Level of Independence: Independent with assistive device(s)        Comments: Pt reports that with w/c she is able to be relatively active; spouse assists with compression stockings, foot care, and fastening bra; hire person for housecleaning 2x/mo, modified indep for seated bathing, grooming, dressing w/ AE (except for stockings); no falls in past 12 months; pt and spouse have life alert pendants        OT Problem List:        OT Treatment/Interventions:      OT Goals(Current goals can be found in the care plan section) Acute Rehab OT Goals Patient Stated Goal: go home OT Goal Formulation: With patient/family Time For Goal Achievement: 10/16/16 Potential to Achieve Goals: Good  OT Frequency:     Barriers to D/C:            Co-evaluation              End of Session Nurse Communication: Other (comment) (RN notified, pt eager to get tele removed and go home)  Activity Tolerance: Patient tolerated treatment well Patient left: in bed;with call bell/phone within reach;with family/visitor present (sitting EOB, per pt preference)  OT Visit Diagnosis: Other abnormalities of gait and mobility (R26.89);Muscle weakness (generalized) (M62.81);Other symptoms and signs involving cognitive function                Time: 1432-1510 OT Time Calculation (min): 38 min Charges:  OT General Charges $OT Visit: 1 Procedure OT Evaluation $OT Eval Low Complexity: 1 Procedure OT  Treatments $Self Care/Home Management : 23-37 mins G-Codes: OT G-codes **NOT FOR INPATIENT CLASS** Functional Assessment Tool Used: AM-PAC 6 Clicks Daily Activity;Clinical judgement Functional Limitation: Self care Self Care Current Status (T1572): At least 20 percent but less than 40 percent impaired, limited or restricted Self Care Goal Status (I2035): At least 20 percent but less than 40 percent impaired, limited or restricted Self Care Discharge Status [phone]): At least 20 percent but less than 40 percent impaired, limited or restricted   Jeni Salles, MPH, MS, OTR/L ascom [phone] 10/15/16, 3:44 PM

## 2016-10-15 NOTE — Care Management Obs Status (Signed)
[facility] NOTIFICATION   Patient Details  Name: Teresa Krueger MRN: [id number] Date of Birth: 27-Jan-1934   Medicare Observation Status Notification Given:  Yes    Shelbie Ammons, RN 10/15/2016, 9:42 AM

## 2016-10-15 NOTE — Progress Notes (Signed)
Pt is being discharged today, discharge instructions reviewed with the patient and her husband. They both verified understanding. 0 paper prescriptions given to the patient. All belongings packed and returned to patient, she was rolled out in a wheelchair by staff.

## 2016-10-15 NOTE — Progress Notes (Signed)
SLP Cancellation Note  Patient Details Name: SONNI BARSE MRN: [id number] DOB: 1933-10-10   Cancelled treatment:       Reason Eval/Treat Not Completed: SLP screened, no needs identified, will sign off (chart reviewed; consulted NSG then met w/ pt).  Pt denied any difficulty swallowing and is currently on a regular diet; tolerates swallowing pills w/ water per NSG. Pt conversed at conversational level w/out deficits noted; pt denied any speech-language deficits; "I'm doing fine".  No further skilled ST services indicated as pt appears at her baseline. Pt agreed. NSG to reconsult if any change in status.    Orinda Kenner, MS, CCC-SLP Samone Guhl 10/15/2016, 10:12 AM

## 2016-10-16 LAB — HEMOGLOBIN A1C
Hgb A1c MFr Bld: 5.9 % — ABNORMAL HIGH (ref 4.8–5.6)
MEAN PLASMA GLUCOSE: 123 mg/dL

## 2016-10-25 ENCOUNTER — Telehealth: Payer: Self-pay | Admitting: Internal Medicine

## 2016-10-25 NOTE — Telephone Encounter (Signed)
Informed patient this was a steroid injection she was given by podiatry.

## 2016-10-25 NOTE — Telephone Encounter (Signed)
Pt has a question regarding her Celestone. She is not sure if Dr. Mortimer Fries prescribed this for her or not. Please call.

## 2016-10-29 ENCOUNTER — Encounter (INDEPENDENT_AMBULATORY_CARE_PROVIDER_SITE_OTHER): Payer: Self-pay | Admitting: Vascular Surgery

## 2016-10-29 ENCOUNTER — Ambulatory Visit (INDEPENDENT_AMBULATORY_CARE_PROVIDER_SITE_OTHER): Payer: Medicare Other | Admitting: Vascular Surgery

## 2016-10-29 VITALS — BP 131/53 | HR 68 | Resp 16 | Ht 60.0 in | Wt 258.0 lb

## 2016-10-29 DIAGNOSIS — I6529 Occlusion and stenosis of unspecified carotid artery: Secondary | ICD-10-CM | POA: Insufficient documentation

## 2016-10-29 DIAGNOSIS — E1140 Type 2 diabetes mellitus with diabetic neuropathy, unspecified: Secondary | ICD-10-CM

## 2016-10-29 DIAGNOSIS — I10 Essential (primary) hypertension: Secondary | ICD-10-CM

## 2016-10-29 DIAGNOSIS — I6523 Occlusion and stenosis of bilateral carotid arteries: Secondary | ICD-10-CM

## 2016-10-29 DIAGNOSIS — I890 Lymphedema, not elsewhere classified: Secondary | ICD-10-CM | POA: Diagnosis not present

## 2016-10-29 DIAGNOSIS — M7989 Other specified soft tissue disorders: Secondary | ICD-10-CM

## 2016-10-29 DIAGNOSIS — G459 Transient cerebral ischemic attack, unspecified: Secondary | ICD-10-CM

## 2016-10-29 NOTE — Assessment & Plan Note (Signed)
Carotid duplex the hospital suggested less than 50% right ICA stent any percent left ICA stenosis. She needs to be on antiplatelet there. Should take Plavix 75 mg daily. She says she cannot tolerate aspirin but as long as she takes Plavix that should be fine. She is on a statin agent. I will plan to recheck her carotids in 3 months. This would be less than atypical threshold for repair and she is certainly not a low risk patient.

## 2016-10-29 NOTE — Assessment & Plan Note (Signed)
This could've been from multiple sources with her previous history of cardiac disease and pacemaker, but carotid disease is certainly a possibility. She was not on antiplatelet therapy and could not tolerate aspirin. She should remain on Plavix.

## 2016-10-29 NOTE — Progress Notes (Signed)
MRN : [id number]  Teresa Krueger is a 83 y.o. (1934/02/12) female who presents with chief complaint of  Chief Complaint  Patient presents with  . Routine Post Op    2 week [facility] follow up  .  History of Present Illness: I am asked to see the patient in follow up after hospital discharge for TIA. She reports an episode about 2 weeks ago and was diagnosed with a TIA. She blacked out and woke up confused with difficulty with each. Within minutes, she seemed back to her baseline. She has been struggling with low blood pressure for months now. She reports stable leg swelling which we have seen her for previously. As part of her workup in the hospital she had a carotid duplex which I have reviewed. This demonstrates bulky calcific plaque bilaterally but velocities which would be in the less than 50% range on the right and in the 50-70% range on the left. No CT angiogram was performed in part because of the lower degree of stenosis but also because she has chronic renal insufficiency. She was started on Plavix which she has begun taking. She cannot take aspirin. She has no new complaints today but follows up for further evaluation and treatment of her carotid disease.  Current Outpatient Prescriptions  Medication Sig Dispense Refill  . Acetaminophen (TYLENOL EXTRA STRENGTH PO) Take by mouth.    . ALPRAZolam (XANAX) 0.5 MG tablet Take 1 tablet (0.5 mg total) by mouth 3 (three) times daily as needed for anxiety. 60 tablet 0  . Blood Glucose Monitoring Suppl (FIFTY50 GLUCOSE METER 2.0) w/Device KIT Use as directed. Bayer Contour glucometer Dx E11.8    . clopidogrel (PLAVIX) 75 MG tablet Take 1 tablet (75 mg total) by mouth daily. 30 tablet 0  . conjugated estrogens (PREMARIN) vaginal cream Place 1 Applicatorful vaginally daily. Apply 0.55m (pea-sized amount)  just inside the vaginal introitus with a finger-tip every night for two weeks and then Monday, Wednesday and Friday nights. 30 g 12  . fluticasone  (FLONASE) 50 MCG/ACT nasal spray Place 2 sprays into both nostrils daily. (Patient taking differently: Place 2 sprays into both nostrils daily as needed for rhinitis. ) 48 g 2  . glucose blood (BAYER CONTOUR TEST) test strip Use one strip to check glucose three times daily    . insulin lispro protamine-lispro (HUMALOG 75/25 MIX) (75-25) 100 UNIT/ML SUSP injection Inject 15 Units into the skin 2 (two) times daily. (Patient taking differently: Inject 10-12 Units into the skin 2 (two) times daily. ) 10 mL 11  . KLOR-CON M20 20 MEQ tablet Take 20 mEq by mouth every other day.     . lidocaine (LIDODERM) 5 % Place 2 patches onto the skin daily. Remove & Discard patch within 12 hours or as directed by MD    . losartan (COZAAR) 25 MG tablet Take 25 mg by mouth daily.    . mometasone (ELOCON) 0.1 % lotion Reported on 12/05/2015    . Multiple Vitamin (MULTIVITAMIN WITH MINERALS) TABS tablet Take 1 tablet by mouth daily.    . mupirocin ointment (BACTROBAN) 2 % Apply 1 application topically daily. Reported on 12/05/2015    . omeprazole (PRILOSEC) 20 MG capsule Take 1 capsule (20 mg total) by mouth 2 (two) times daily. 60 capsule 5  . pantoprazole (PROTONIX) 40 MG tablet Take by mouth.    . Simethicone (MYLANTA GAS PO) Take 1 tablet by mouth daily.     . simvastatin (ZOCOR) 20  MG tablet Take 20 mg by mouth daily.    Marland Kitchen spironolactone (ALDACTONE) 25 MG tablet Take 25 mg by mouth every other day.     . torsemide (DEMADEX) 20 MG tablet Take 20 mg by mouth daily.     . traMADol (ULTRAM) 50 MG tablet Take 50 mg by mouth every 6 (six) hours as needed.     . triamcinolone ointment (KENALOG) 0.1 % Apply twice a day to affected areas    . AMBULATORY NON FORMULARY MEDICATION Medication Name: incentive spirometry use 10-15 times daily. Dx:J47.9 (Patient not taking: Reported on 10/29/2016) 1 each 0  . Respiratory Therapy Supplies (FLUTTER) DEVI Use as directed (Patient not taking: Reported on 10/29/2016) 1 each 0   No current  facility-administered medications for this visit.     Past Medical History:  Diagnosis Date  . Arthritis   . Asthma   . Barrett's esophagus   . Breast cancer ([place]) 1999   LT MASTECTOMY  . Bronchiectasis ([place])   . COPD (chronic obstructive pulmonary disease) ([place])   . Diabetes mellitus ([place])   . Hypercholesterolemia   . Hypertension   . Lymphedema   . Mini stroke ([place])   . Osteoporosis   . Pacemaker     Past Surgical History:  Procedure Laterality Date  . CARPAL TUNNEL RELEASE     right  . CATARACT EXTRACTION     bilateral  . GALLBLADDER SURGERY    . MASTECTOMY Left 1999   BREAST CA  . PACEMAKER INSERTION Right 03/21/2015   Procedure: INSERTION PACEMAKER;  Surgeon: Isaias Cowman, MD;  Location: [facility] ORS;  Service: Cardiovascular;  Laterality: Right;  . TOTAL HIP ARTHROPLASTY  2001   left  . TRIGGER FINGER RELEASE     bilateral    Social History Social History  Substance Use Topics  . Smoking status: Former Smoker    Packs/day: 1.00    Years: 30.00    Types: Cigarettes    Quit date: 12/27/1992  . Smokeless tobacco: Never Used  . Alcohol use No  Married, husband accompanies today  Family History Family History  Problem Relation Age of Onset  . Heart disease Paternal Grandmother   . Diabetes Paternal Grandmother   . Heart disease Father   . Heart disease Mother   . Heart disease Maternal Grandmother   . Stroke Maternal Aunt   . Breast cancer Paternal Aunt 94  . Lung cancer Brother 65  . Kidney disease Cousin   . Bladder Cancer Neg Hx     Allergies  Allergen Reactions  . Aspirin Swelling and Other (See Comments)    Pt states that she has tongue swelling.    . Celebrex [Celecoxib] Anaphylaxis  . Meloxicam Swelling  . Nsaids Anaphylaxis  . Rofecoxib Anaphylaxis  . Tolmetin Anaphylaxis  . Bactrim [Sulfamethoxazole-Trimethoprim] Hives and Itching  . Biguanide Copolymer Other (See Comments)    Reaction:  Unknown   . Buprenorphine Hcl Other (See  Comments)    Reaction:  Unknown   . Ciprofloxacin Other (See Comments)    Reaction:  Unknown   . Codeine Nausea And Vomiting and Other (See Comments)    Reaction:  Syncope   . Delsym [Dextromethorphan Polistirex Er] Other (See Comments)    Reaction:  GI upset   . Dextromethorphan Hbr Other (See Comments)    Reaction:  GI upset   . Furosemide Other (See Comments)    Reaction:  Unknown   . Hydrocodone Other (See Comments)  Reaction:  Unknown   . Metformin Other (See Comments)    Reaction:  Unknown   . Morphine And Related Other (See Comments)    Reaction:  Unknown   . Penicillins Swelling and Other (See Comments)    Pt did not answer the additional questions for this medication because she does not remember.    . Pregabalin Other (See Comments)    Reaction:  Unknown   . Promethazine Other (See Comments)    Reaction:  Unknown   . Propoxyphene Other (See Comments)    Reaction:  Unknown   . Quinolones Other (See Comments)    Reaction:  Unknown   . Seroquel [Quetiapine Fumarate] Other (See Comments)    Reaction:  Unknown   . Serotonin Reuptake Inhibitors (Ssris) Other (See Comments)    Reaction:  Unknown   . Sulfa Antibiotics Hives and Itching  . Tramadol Nausea Only  . Ultracet [Tramadol-Acetaminophen] Other (See Comments)    Reaction:  Unknown      REVIEW OF SYSTEMS (Negative unless checked)  Constitutional: []Weight loss  []Fever  []Chills Cardiac: []Chest pain   []Chest pressure   [x]Palpitations   []Shortness of breath when laying flat   []Shortness of breath at rest   [x]Shortness of breath with exertion. Vascular:  []Pain in legs with walking   []Pain in legs at rest   []Pain in legs when laying flat   []Claudication   []Pain in feet when walking  []Pain in feet at rest  []Pain in feet when laying flat   []History of DVT   []Phlebitis   [x]Swelling in legs   []Varicose veins   []Non-healing ulcers Pulmonary:   []Uses home oxygen   []Productive cough   []Hemoptysis    []Wheeze  []COPD   []Asthma Neurologic:  []Dizziness  []Blackouts   []Seizures   []History of stroke   [x]History of TIA  []Aphasia   []Temporary blindness   []Dysphagia   []Weakness or numbness in arms   [x]Weakness or numbness in legs Musculoskeletal:  [x]Arthritis   []Joint swelling   []Joint pain   []Low back pain Hematologic:  []Easy bruising  []Easy bleeding   []Hypercoagulable state   []Anemic  []Hepatitis Gastrointestinal:  []Blood in stool   []Vomiting blood  []Gastroesophageal reflux/heartburn   []Difficulty swallowing. Genitourinary:  [x]Chronic kidney disease   []Difficult urination  []Frequent urination  []Burning with urination   []Blood in urine Skin:  []Rashes   []Ulcers   []Wounds Psychological:  []History of anxiety   [] History of major depression.  Physical Examination  Vitals:   10/29/16 1036  BP: (!) 131/53  Pulse: 68  Resp: 16  Weight: 258 lb (117 kg)  Height: 5' (1.524 m)   Body mass index is 50.39 kg/m. Gen:  WD/WN, NAD Head: [place]/AT, No temporalis wasting. Ear/Nose/Throat: Hearing grossly intact, nares w/o erythema or drainage, trachea midline Eyes: Conjunctiva clear. Sclera non-icteric Neck: Supple.  No JVD.  Pulmonary:  Good air movement, No use of accessory muscles, respirations not labored Cardiac: Irregular Vascular:  Vessel Right Left  Radial Palpable Palpable                                   Gastrointestinal: soft, non-tender/non-distended.  Musculoskeletal: Uses a motorized wheelchair.  No deformity or atrophy. 2-3+ bilateral lower extremity edema Neurologic: CN 2-12 intact. Sensation grossly intact in extremities.  Symmetrical.  Speech is fluent. Psychiatric:  Judgment intact, Mood & affect appropriate for pt's clinical situation. Dermatologic: No rashes or ulcers noted.  No cellulitis or open wounds.      CBC Lab Results  Component Value Date   WBC 4.6 10/14/2016   HGB 12.4 10/14/2016   HCT 37.7 10/14/2016   MCV 87.8 10/14/2016    PLT 198 10/14/2016    BMET    Component Value Date/Time   NA 137 10/14/2016 1656   NA 141 03/18/2012 1341   K 4.8 10/14/2016 1656   K 4.9 03/18/2012 1341   CL 103 10/14/2016 1656   CL 104 03/18/2012 1341   CO2 27 10/14/2016 1656   CO2 29 03/18/2012 1341   GLUCOSE 194 (H) 10/14/2016 1656   GLUCOSE 64 (L) 03/18/2012 1341   BUN 27 (H) 10/14/2016 1656   BUN 34 (H) 03/18/2012 1341   CREATININE 1.12 (H) 10/14/2016 1656   CREATININE 1.40 (H) 04/06/2012 1359   CALCIUM 9.4 10/14/2016 1656   CALCIUM 9.4 03/18/2012 1341   GFRNONAA 45 (L) 10/14/2016 1656   GFRNONAA 36 (L) 04/06/2012 1359   GFRAA 52 (L) 10/14/2016 1656   GFRAA 42 (L) 04/06/2012 1359   Estimated Creatinine Clearance: 46.1 mL/min (A) (by C-G formula based on SCr of 1.12 mg/dL (H)).  COAG Lab Results  Component Value Date   INR 1.09 10/14/2016   INR 1.16 03/20/2015    Radiology Ct Head Wo Contrast  Result Date: 10/14/2016 CLINICAL DATA:  Acute onset of lightheadedness and blurred vision. Aphasia. Initial encounter. EXAM: CT HEAD WITHOUT CONTRAST TECHNIQUE: Contiguous axial images were obtained from the base of the skull through the vertex without intravenous contrast. COMPARISON:  CT of the head performed 11/22/2006, and MRI of the brain performed 03/30/2013 FINDINGS: Brain: No evidence of acute infarction, hemorrhage, hydrocephalus, extra-axial collection or mass lesion/mass effect. Prominence of the ventricles and sulci reflects mild cortical volume loss. Mild cerebellar atrophy is noted. Mild periventricular and subcortical white matter change likely reflects small vessel ischemic microangiopathy. The brainstem and fourth ventricle are within normal limits. The basal ganglia are unremarkable in appearance. The cerebral hemispheres demonstrate grossly normal gray-white differentiation. No mass effect or midline shift is seen. Vascular: No hyperdense vessel or unexpected calcification. Skull: There is no evidence of  fracture; a likely 1.2 cm frontal osteoma is noted at the right frontal sinus. Sinuses/Orbits: The orbits are within normal limits. The paranasal sinuses and mastoid air cells are well-aerated. Other: No significant soft tissue abnormalities are seen. IMPRESSION: 1. No acute intracranial pathology seen on CT. 2. Mild cortical volume loss and scattered small vessel ischemic microangiopathy. 3. 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 Electronically Signed   By: Garald Balding M.D.   On: 10/14/2016 17:29   US Carotid Bilateral (at [facility] Only)  Result Date: 10/15/2016 CLINICAL DATA:  TIA EXAM: BILATERAL CAROTID DUPLEX ULTRASOUND TECHNIQUE: Pearline Cables scale imaging, color Doppler and duplex ultrasound were performed of bilateral carotid and vertebral arteries in the neck. COMPARISON:  11/22/2006 FINDINGS: Criteria: Quantification of carotid stenosis is based on velocity parameters that correlate the residual internal carotid diameter with NASCET-based stenosis levels, using the diameter of the distal internal carotid lumen as the denominator for stenosis measurement. The following velocity measurements were obtained: RIGHT ICA:  97 cm/sec CCA:  53 cm/sec SYSTOLIC ICA/CCA RATIO:  1.8 DIASTOLIC ICA/CCA RATIO:  2.5 ECA:  364 cm/sec LEFT ICA:  132 cm/sec CCA:  992 cm/sec SYSTOLIC ICA/CCA RATIO:  1.3 DIASTOLIC  ICA/CCA RATIO:  2.0 ECA:  157 cm/sec RIGHT CAROTID ARTERY: There is scattered calcified plaque in the common carotid. There is extensive calcified plaque in the bulb which obscures the lumen. Low resistance internal carotid Doppler pattern is preserved. RIGHT VERTEBRAL ARTERY:  Antegrade. LEFT CAROTID ARTERY: There is prominent calcified and irregular plaque in the common carotid which narrows the lumen. There is extensive calcified plaque in the bulb which shadows the lumen somewhat. Low resistance internal carotid Doppler pattern. LEFT VERTEBRAL ARTERY:  Antegrade. IMPRESSION: Less than 50%  stenosis in the right internal carotid artery and 50-69% stenosis in the left internal carotid artery is based on determined peak systolic velocities. There is extensive calcified plaque in both bulbs which obscures the lumen, therefore true narrowing may be much greater. Consider CTA or MRA of the neck to further characterize. Electronically Signed   By: Marybelle Killings M.D.   On: 10/15/2016 11:29   Mm Screening Breast Tomo Uni R  Result Date: 10/03/2016 CLINICAL DATA:  Screening. EXAM: 2D DIGITAL SCREENING UNILATERAL RIGHT MAMMOGRAM WITH CAD AND ADJUNCT TOMO COMPARISON:  Previous exam(s). ACR Breast Density Category b: There are scattered areas of fibroglandular density. FINDINGS: The patient has had a left mastectomy. There are no findings suspicious for malignancy. Images were processed with CAD. IMPRESSION: No mammographic evidence of malignancy. A result letter of this screening mammogram will be mailed directly to the patient. RECOMMENDATION: Screening mammogram in one year.  (Code:SM-R-74M) BI-RADS CATEGORY  1: Negative. Electronically Signed   By: Nolon Nations M.D.   On: 10/03/2016 14:19     Assessment/Plan  Diabetes mellitus blood glucose control important in reducing the progression of atherosclerotic disease. Also, involved in wound healing. On appropriate medications.   Morbid obesity Would contribute to lower extremity symptoms.  Swelling of limb The patient has prominent swelling and stasis changes to both lower extremities. This is likely multifactorial including her morbid obesity, immobility with dependent extremities, but venous insufficiency may be playing a major contributing role. We discussed the role of leg elevation and compression stockings. She has a difficult  time getting on stockings with her back and size and she has a difficult time elevating her legs because of her back. increased activity would also be of benefit. We will see her back following her venous  duplex to discuss the results and determine further treatment options.   Lymphedema The patient has left upper extremity lymphedema. I believe she likely has bilateral lower extremity lymphedema secondary to previous cellulitis, chronic swelling and stasis changes, and scarring of the lymphatic channels. She has not had a venous duplex and it is important that this be evaluated as well to evaluate for venous insufficiency of the lower extremities. Recommend compression and elevation as tolerated.      TIA (transient ischemic attack) This could've been from multiple sources with her previous history of cardiac disease and pacemaker, but carotid disease is certainly a possibility. She was not on antiplatelet therapy and could not tolerate aspirin. She should remain on Plavix.  Carotid artery stenosis Carotid duplex the hospital suggested less than 50% right ICA stent any percent left ICA stenosis. She needs to be on antiplatelet there. Should take Plavix 75 mg daily. She says she cannot tolerate aspirin but as long as she takes Plavix that should be fine. She is on a statin agent. I will plan to recheck her carotids in 3 months. This would be less than atypical threshold for repair and she is certainly not  a low risk patient.    Leotis Pain, MD  10/29/2016 11:30 AM    This note was created with Dragon medical transcription system.  Any errors from dictation are purely unintentional

## 2016-11-05 ENCOUNTER — Ambulatory Visit (INDEPENDENT_AMBULATORY_CARE_PROVIDER_SITE_OTHER): Payer: Medicare Other | Admitting: Podiatry

## 2016-11-05 DIAGNOSIS — L84 Corns and callosities: Secondary | ICD-10-CM

## 2016-11-05 DIAGNOSIS — M79676 Pain in unspecified toe(s): Secondary | ICD-10-CM | POA: Diagnosis not present

## 2016-11-05 DIAGNOSIS — B351 Tinea unguium: Secondary | ICD-10-CM | POA: Diagnosis not present

## 2016-11-05 DIAGNOSIS — Q828 Other specified congenital malformations of skin: Secondary | ICD-10-CM

## 2016-11-06 NOTE — Progress Notes (Signed)
   SUBJECTIVE Patient with a history of diabetes mellitus presents to office today complaining of elongated, thickened nails. Pain while ambulating in shoes. Patient is unable to trim their own nails. She also complains of a painful callus on the medial side of the heel of the right foot.  OBJECTIVE General Patient is awake, alert, and oriented x 3 and in no acute distress. Derm Skin is dry and supple bilateral. Negative open lesions or macerations. Remaining integument unremarkable. Nails are tender, long, thickened and dystrophic with subungual debris, consistent with onychomycosis, 1-5 bilateral. No signs of infection noted. Hyperkeratotic lesion present on the right medial heel. Pain on palpation with a central nucleated core noted. Vasc  DP and PT pedal pulses palpable bilaterally. Temperature gradient within normal limits.  Neuro Epicritic and protective threshold sensation diminished bilaterally.  Musculoskeletal Exam No symptomatic pedal deformities noted bilateral. Muscular strength within normal limits.  ASSESSMENT 1. Diabetes Mellitus w/ peripheral neuropathy 2. Onychomycosis of nail due to dermatophyte bilateral 3. Pain in foot bilateral 4. Callus right medial heel  PLAN OF CARE 1. Patient evaluated today. 2. Instructed to maintain good pedal hygiene and foot care. Stressed importance of controlling blood sugar.  3. Mechanical debridement of nails 1-5 bilaterally performed using a nail nipper. Filed with dremel without incident.  4. Recommended AmLactin lotion to callus. 5. Return to clinic in 3 mos.     Edrick Kins, DPM [facility]  Dr. Edrick Kins, [place]                                        [place],  [address]                Office [phone]  Fax [phone]

## 2016-11-08 ENCOUNTER — Ambulatory Visit: Payer: Medicare Other | Admitting: Podiatry

## 2017-02-04 ENCOUNTER — Encounter (INDEPENDENT_AMBULATORY_CARE_PROVIDER_SITE_OTHER): Payer: Medicare Other

## 2017-02-04 ENCOUNTER — Ambulatory Visit (INDEPENDENT_AMBULATORY_CARE_PROVIDER_SITE_OTHER): Payer: Medicare Other | Admitting: Vascular Surgery

## 2017-02-09 ENCOUNTER — Emergency Department: Payer: Medicare Other

## 2017-02-09 ENCOUNTER — Emergency Department
Admission: EM | Admit: 2017-02-09 | Discharge: 2017-02-09 | Disposition: A | Discharge status: Home / Self Care | Payer: Medicare Other | Attending: Emergency Medicine | Admitting: Emergency Medicine

## 2017-02-09 DIAGNOSIS — Z9012 Acquired absence of left breast and nipple: Secondary | ICD-10-CM | POA: Diagnosis not present

## 2017-02-09 DIAGNOSIS — J449 Chronic obstructive pulmonary disease, unspecified: Secondary | ICD-10-CM | POA: Insufficient documentation

## 2017-02-09 DIAGNOSIS — G458 Other transient cerebral ischemic attacks and related syndromes: Secondary | ICD-10-CM | POA: Diagnosis not present

## 2017-02-09 DIAGNOSIS — E119 Type 2 diabetes mellitus without complications: Secondary | ICD-10-CM | POA: Diagnosis not present

## 2017-02-09 DIAGNOSIS — Z950 Presence of cardiac pacemaker: Secondary | ICD-10-CM | POA: Insufficient documentation

## 2017-02-09 DIAGNOSIS — Z87891 Personal history of nicotine dependence: Secondary | ICD-10-CM | POA: Insufficient documentation

## 2017-02-09 DIAGNOSIS — I2510 Atherosclerotic heart disease of native coronary artery without angina pectoris: Secondary | ICD-10-CM | POA: Insufficient documentation

## 2017-02-09 DIAGNOSIS — Z79899 Other long term (current) drug therapy: Secondary | ICD-10-CM | POA: Insufficient documentation

## 2017-02-09 DIAGNOSIS — J45909 Unspecified asthma, uncomplicated: Secondary | ICD-10-CM | POA: Diagnosis not present

## 2017-02-09 DIAGNOSIS — Z853 Personal history of malignant neoplasm of breast: Secondary | ICD-10-CM | POA: Diagnosis not present

## 2017-02-09 DIAGNOSIS — Z794 Long term (current) use of insulin: Secondary | ICD-10-CM | POA: Insufficient documentation

## 2017-02-09 DIAGNOSIS — Z7901 Long term (current) use of anticoagulants: Secondary | ICD-10-CM | POA: Insufficient documentation

## 2017-02-09 DIAGNOSIS — R4781 Slurred speech: Secondary | ICD-10-CM | POA: Diagnosis present

## 2017-02-09 LAB — COMPREHENSIVE METABOLIC PANEL
ALBUMIN: 4.3 g/dL (ref 3.5–5.0)
ALT: 14 U/L (ref 14–54)
ANION GAP: 8 (ref 5–15)
AST: 20 U/L (ref 15–41)
Alkaline Phosphatase: 73 U/L (ref 38–126)
BILIRUBIN TOTAL: 0.5 mg/dL (ref 0.3–1.2)
BUN: 31 mg/dL — ABNORMAL HIGH (ref 6–20)
CO2: 29 mmol/L (ref 22–32)
Calcium: 9.9 mg/dL (ref 8.9–10.3)
Chloride: 102 mmol/L (ref 101–111)
Creatinine, Ser: 1.1 mg/dL — ABNORMAL HIGH (ref 0.44–1.00)
GFR calc Af Amer: 53 mL/min — ABNORMAL LOW (ref >60)
GFR, EST NON AFRICAN AMERICAN: 46 mL/min — AB (ref >60)
Glucose, Bld: 141 mg/dL — ABNORMAL HIGH (ref 65–99)
POTASSIUM: 4.6 mmol/L (ref 3.5–5.1)
Sodium: 139 mmol/L (ref 135–145)
TOTAL PROTEIN: 7.1 g/dL (ref 6.5–8.1)

## 2017-02-09 LAB — CBC
HEMATOCRIT: 37.6 % (ref 35.0–47.0)
Hemoglobin: 12.9 g/dL (ref 12.0–16.0)
MCH: 29.2 pg (ref 26.0–34.0)
MCHC: 34.2 g/dL (ref 32.0–36.0)
MCV: 85.4 fL (ref 80.0–100.0)
Platelets: 204 10*3/uL (ref 150–440)
RBC: 4.4 MIL/uL (ref 3.80–5.20)
RDW: 13.3 % (ref 11.5–14.5)
WBC: 4.2 10*3/uL (ref 3.6–11.0)

## 2017-02-09 NOTE — ED Provider Notes (Signed)
[facility] Emergency Department Provider Note  ____________________________________________  Time seen: Approximately 3:50 PM  I have reviewed the triage vital signs and the nursing notes.   HISTORY  Chief Complaint Weakness    HPI Teresa Krueger is a 83 y.o. female who complains of slurred speech and word finding difficulty that started at about 2:30 PM today.Denies any focal paresthesias or weakness. No recent trauma. Symptoms came out of the blue very suddenly and lasted for 5 minutes. Resolved spontaneously. No aggravating or alleviating factors. No recent illness. No headache. No vision changes.     Past Medical History:  Diagnosis Date  . Arthritis   . Asthma   . Barrett's esophagus   . Breast cancer ([place]) 1999   LT MASTECTOMY  . Bronchiectasis ([place])   . COPD (chronic obstructive pulmonary disease) ([place])   . Diabetes mellitus ([place])   . Hypercholesterolemia   . Hypertension   . Lymphedema   . Mini stroke ([place])   . Osteoporosis   . Pacemaker      Patient Active Problem List   Diagnosis Date Noted  . Carotid artery stenosis 10/29/2016  . TIA (transient ischemic attack) 10/14/2016  . Swelling of limb 04/10/2016  . Varicose veins of leg with pain, bilateral 04/10/2016  . Lymphedema 04/10/2016  . Osteoarthritis 03/19/2016  . Osteoporosis 03/19/2016  . Coronary artery calcification seen on CAT scan 02/08/2016  . Incidental lung nodule 02/08/2016  . Thoracic aortic atherosclerosis ([place]) 02/08/2016  . S/P placement of cardiac pacemaker 03/30/2015  . Morbid obesity ([place]) 03/17/2015  . Hypersomnia with sleep apnea 03/17/2015  . Mobitz type 2 second degree AV block 03/16/2015  . Multiple pulmonary nodules 09/13/2014  . Lung field abnormal 09/13/2014  . Barrett's esophagus 12/21/2013  . History of adenomatous polyp of colon 12/21/2013  . Hx of adenomatous colonic polyps 12/21/2013  . Benign essential hypertension 11/30/2013  .  Acromioclavicular joint arthritis 11/23/2013  . Glenohumeral arthritis 11/23/2013  . Chest pain 10/21/2013  . Adult body mass index 50.0-59.9 ([place]) 02/09/2013  . Morbid obesity with BMI of 50.0-59.9, adult ([place]) 02/09/2013  . Primary localized osteoarthrosis, lower leg 02/09/2013  . Bronchiectasis ([place]) 05/19/2012  . Chronic bronchitis 12/31/2011  . Cough 12/31/2011  . GERD (gastroesophageal reflux disease) 12/31/2011  . Diabetes mellitus ([place])   . Hypertension      Past Surgical History:  Procedure Laterality Date  . CARPAL TUNNEL RELEASE     right  . CATARACT EXTRACTION     bilateral  . GALLBLADDER SURGERY    . MASTECTOMY Left 1999   BREAST CA  . PACEMAKER INSERTION Right 03/21/2015   Procedure: INSERTION PACEMAKER;  Surgeon: Isaias Cowman, MD;  Location: [facility] ORS;  Service: Cardiovascular;  Laterality: Right;  . TOTAL HIP ARTHROPLASTY  2001   left  . TRIGGER FINGER RELEASE     bilateral     Prior to Admission medications   Medication Sig Start Date End Date Taking? Authorizing Provider  Acetaminophen (TYLENOL EXTRA STRENGTH PO) Take by mouth.    [provider]  ALPRAZolam Duanne Moron) 0.5 MG tablet Take 1 tablet (0.5 mg total) by mouth 3 (three) times daily as needed for anxiety. 03/22/15   Adin Hector, MD  AMBULATORY NON FORMULARY MEDICATION Medication Name: incentive spirometry use 10-15 times daily. Dx:J47.9 Patient not taking: Reported on 10/29/2016 10/14/16   Flora Lipps, MD  Blood Glucose Monitoring Suppl (FIFTY50 GLUCOSE METER 2.0) w/Device KIT Use as directed. Agricultural engineer  Dx E11.8 05/07/16 05/07/17  [provider]  clopidogrel (PLAVIX) 75 MG tablet Take 1 tablet (75 mg total) by mouth daily. 10/16/16   Fritzi Mandes, MD  conjugated estrogens (PREMARIN) vaginal cream Place 1 Applicatorful vaginally daily. Apply 0.'5mg'$  (pea-sized amount)  just inside the vaginal introitus with a finger-tip every night for two weeks and then Monday,  Wednesday and Friday nights. 12/27/15   Zara Council A, PA-C  fluticasone (FLONASE) 50 MCG/ACT nasal spray Place 2 sprays into both nostrils daily. Patient taking differently: Place 2 sprays into both nostrils daily as needed for rhinitis.  03/04/14 10/14/17  Juanito Doom, MD  glucose blood (BAYER CONTOUR TEST) test strip Use one strip to check glucose three times daily 04/04/16   [provider]  insulin lispro protamine-lispro (HUMALOG 75/25 MIX) (75-25) 100 UNIT/ML SUSP injection Inject 15 Units into the skin 2 (two) times daily. Patient taking differently: Inject 10-12 Units into the skin 2 (two) times daily.  03/22/15   Tama High III, MD  KLOR-CON M20 20 MEQ tablet Take 20 mEq by mouth every other day.  06/25/15   [provider]  lidocaine (LIDODERM) 5 % Place 2 patches onto the skin daily. Remove & Discard patch within 12 hours or as directed by MD    [provider]  losartan (COZAAR) 25 MG tablet Take 25 mg by mouth daily.    [provider]  mometasone (ELOCON) 0.1 % lotion Reported on 12/05/2015 11/08/15   [provider]  Multiple Vitamin (MULTIVITAMIN WITH MINERALS) TABS tablet Take 1 tablet by mouth daily.    [provider]  mupirocin ointment (BACTROBAN) 2 % Apply 1 application topically daily. Reported on 12/05/2015 11/08/15   [provider]  omeprazole (PRILOSEC) 20 MG capsule Take 1 capsule (20 mg total) by mouth 2 (two) times daily. 03/21/14   Juanito Doom, MD  pantoprazole (PROTONIX) 40 MG tablet Take by mouth. 10/23/16 10/23/17  [provider]  Respiratory Therapy Supplies (FLUTTER) DEVI Use as directed Patient not taking: Reported on 10/29/2016 02/12/16   Flora Lipps, MD  Simethicone (MYLANTA GAS PO) Take 1 tablet by mouth daily.     [provider]  simvastatin (ZOCOR) 20 MG tablet Take 20 mg by mouth daily.    [provider]  spironolactone (ALDACTONE) 25 MG tablet Take 25 mg by  mouth every other day.     [provider]  torsemide (DEMADEX) 20 MG tablet Take 20 mg by mouth daily.  05/20/16   [provider]  traMADol (ULTRAM) 50 MG tablet Take 50 mg by mouth every 6 (six) hours as needed.  10/26/15   [provider]  triamcinolone ointment (KENALOG) 0.1 % Apply twice a day to affected areas 04/04/16   [provider]     Allergies Aspirin; Celebrex [celecoxib]; Meloxicam; Nsaids; Rofecoxib; Tolmetin; Bactrim [sulfamethoxazole-trimethoprim]; Biguanide copolymer; Buprenorphine hcl; Ciprofloxacin; Codeine; Delsym [dextromethorphan polistirex er]; Dextromethorphan hbr; Furosemide; Hydrocodone; Metformin; Morphine and related; Penicillins; Pregabalin; Promethazine; Propoxyphene; Quinolones; Seroquel [quetiapine fumarate]; Serotonin reuptake inhibitors (ssris); Sulfa antibiotics; Tramadol; and Ultracet [tramadol-acetaminophen]   Family History  Problem Relation Age of Onset  . Heart disease Paternal Grandmother   . Diabetes Paternal Grandmother   . Heart disease Father   . Heart disease Mother   . Heart disease Maternal Grandmother   . Stroke Maternal Aunt   . Breast cancer Paternal Aunt 94  . Lung cancer Brother 16  . Kidney disease Cousin   .  Bladder Cancer Neg Hx     Social History Social History  Substance Use Topics  . Smoking status: Former Smoker    Packs/day: 1.00    Years: 30.00    Types: Cigarettes    Quit date: 12/27/1992  . Smokeless tobacco: Never Used  . Alcohol use No    Review of Systems  Constitutional:   No fever or chills.  ENT:   No sore throat. No rhinorrhea. Cardiovascular:   No chest pain or syncope. Respiratory:   No dyspnea or cough. Gastrointestinal:   Negative for abdominal pain, vomiting and diarrhea.  Musculoskeletal:   Negative for focal pain or swelling All other systems reviewed and are negative except as documented above in ROS and  HPI.  ____________________________________________   PHYSICAL EXAM:  VITAL SIGNS: ED Triage Vitals  Enc Vitals Group     BP 02/09/17 1515 (!) 201/81     Pulse Rate 02/09/17 1515 82     Resp 02/09/17 1515 15     Temp 02/09/17 1515 98.1 F (36.7 C)     Temp Source 02/09/17 1515 Oral     SpO2 02/09/17 1515 98 %     Weight 02/09/17 1516 251 lb (113.9 kg)     Height 02/09/17 1516 5' (1.524 m)     Head Circumference --      Peak Flow --      Pain Score --      Pain Loc --      Pain Edu? --      Excl. in [place]? --     Vital signs reviewed, nursing assessments reviewed.   Constitutional:   Alert and oriented. Well appearing and in no distress. Eyes:   No scleral icterus.  EOMI. No nystagmus. No conjunctival pallor. PERRL. ENT   Head:   Normocephalic and atraumatic.   Nose:   No congestion/rhinnorhea.    Mouth/Throat:   MMM, no pharyngeal erythema. No peritonsillar mass.    Neck:   No meningismus. Full ROM Hematological/Lymphatic/Immunilogical:   No cervical lymphadenopathy. Cardiovascular:   RRR. Symmetric bilateral radial and DP pulses.  No murmurs.  Respiratory:   Normal respiratory effort without tachypnea/retractions. Breath sounds are clear and equal bilaterally. No wheezes/rales/rhonchi. Gastrointestinal:   Soft and nontender. Non distended. There is no CVA tenderness.  No rebound, rigidity, or guarding. Genitourinary:   deferred Musculoskeletal:   Normal range of motion in all extremities. No joint effusions.  No lower extremity tenderness.  No edema. Neurologic:   Normal speech and language.  Motor grossly intact in major muscle groups of upper and lower extremities. No pronator drift. Cerebellar testing. Cranial nerves II through XII intact NIH stroke scale 0 No gross focal neurologic deficits are appreciated.  Skin:    Skin is warm, dry and intact. No rash noted.  No petechiae, purpura, or bullae.  ____________________________________________    LABS  (pertinent positives/negatives) (all labs ordered are listed, but only abnormal results are displayed) Labs Reviewed  CBC  URINALYSIS, COMPLETE (UACMP) WITH MICROSCOPIC  COMPREHENSIVE METABOLIC PANEL  CBG MONITORING, ED   ____________________________________________   EKG  Interpreted by me Atrial sensed ventricular pacer, rate of 79, left axis, left bundle branch block. No acute ischemic changes.  ____________________________________________    RADIOLOGY  No results found.  ____________________________________________   PROCEDURES Procedures  ____________________________________________   INITIAL IMPRESSION / ASSESSMENT AND PLAN / ED COURSE  Pertinent labs & imaging results that were available during my care of the patient were reviewed by  me and considered in my medical decision making (see chart for details).  Patient presents with symptoms of TIA. Stroke scale 0 on arrival to the ED. Symptoms resolved. We'll undertake neurologic workup. Patient has previously had similar symptoms, had an echo in December 2017, had carotid ultrasound April 2018, no clear benefit to repeat hospitalization for risk stratification at this time with resolved symptoms.  Clinical Course as of Feb 10 1811  Sun Feb 09, 2017  1653 D/w neuro consultant, rec. Angio imaging.  I advised that MRI/MRA not available here due to pacemaker.  He doesn't think this requires transfer for MRI, but rather that CTA head/neck will be sufficient. Will order. If negative, suitable for outpt f/u given previous workup that has already been completed. When I assessed pt initially BP already improved to 160/45  [PS]    Clinical Course User Index [PS] Carrie Mew, MD     ----------------------------------------- 6:15 PM on 02/09/2017 -----------------------------------------  Discussed the CT angiogram at length with the patient who is very reluctant to undergo the study. She reports that her doctors  including nephrology in the past and told her she cannot have CT contrast due to 30% kidney function. Her GFR today is 45-50.  She also points out that she has not had a colonoscopy recently because she's considered too risky for moderate sedation. This raises the question of what if anything would be possible as far as surgical intervention for any identified lesions on angiography. The risk-benefit analysis is not favorable in that there may be no benefit to exposing her to the risk of contrast-induced nephropathy if she is otherwise not a candidate for further advanced intervention or surgery. Symptoms are resolved, recommended she follow up with primary care vascular surgery and nephrology for further assessment including risk stratification and risk factor modification. The patient is agreeable with this and eager to go home.  ____________________________________________   FINAL CLINICAL IMPRESSION(S) / ED DIAGNOSES  Final diagnoses:  None      New Prescriptions   No medications on file     Portions of this note were generated with dragon dictation software. Dictation errors may occur despite best attempts at proofreading.    Carrie Mew, MD 02/09/17 [phone]

## 2017-02-09 NOTE — ED Triage Notes (Signed)
Pt came to ED via EMS from home c/o onset of slurred speech at 2pm. Now resolved but reports weakness. History of tias. Bp 201/81.

## 2017-02-09 NOTE — Discharge Instructions (Signed)
Continue taking all your medications and follow up with your doctors for further assessment of your symptoms.

## 2017-02-11 ENCOUNTER — Ambulatory Visit (INDEPENDENT_AMBULATORY_CARE_PROVIDER_SITE_OTHER): Payer: Medicare Other | Admitting: Podiatry

## 2017-02-11 DIAGNOSIS — B351 Tinea unguium: Secondary | ICD-10-CM

## 2017-02-11 DIAGNOSIS — M79676 Pain in unspecified toe(s): Secondary | ICD-10-CM | POA: Diagnosis not present

## 2017-02-11 DIAGNOSIS — E0842 Diabetes mellitus due to underlying condition with diabetic polyneuropathy: Secondary | ICD-10-CM

## 2017-02-11 NOTE — Progress Notes (Signed)
   SUBJECTIVE Patient with a history of diabetes mellitus presents to office today complaining of elongated, thickened nails. Pain while ambulating in shoes. Patient is unable to trim their own nails.   OBJECTIVE General Patient is awake, alert, and oriented x 3 and in no acute distress. Derm Skin is dry and supple bilateral. Negative open lesions or macerations. Remaining integument unremarkable. Nails are tender, long, thickened and dystrophic with subungual debris, consistent with onychomycosis, 1-5 bilateral. No signs of infection noted. Vasc  DP and PT pedal pulses palpable bilaterally. Temperature gradient within normal limits.  Neuro Epicritic and protective threshold sensation diminished bilaterally.  Musculoskeletal Exam No symptomatic pedal deformities noted bilateral. Muscular strength within normal limits.  ASSESSMENT 1. Diabetes Mellitus w/ peripheral neuropathy 2. Onychomycosis of nail due to dermatophyte bilateral 3. Pain in foot bilateral  PLAN OF CARE 1. Patient evaluated today. 2. Instructed to maintain good pedal hygiene and foot care. Stressed importance of controlling blood sugar.  3. Mechanical debridement of nails 1-5 bilaterally performed using a nail nipper. Filed with dremel without incident.  4. Return to clinic in 3 mos.     Sirr Kabel M. Akshay Spang, DPM [facility]  Dr. Laria Grimmett M. Lyvia Mondesir, DPM    [address]                                        [place], [place] [address]                Office [phone]  Fax [phone]       

## 2017-02-14 ENCOUNTER — Ambulatory Visit
Admission: RE | Admit: 2017-02-14 | Discharge: 2017-02-14 | Disposition: A | Discharge status: Home / Self Care | Payer: Medicare Other | Source: Ambulatory Visit | Attending: Otolaryngology | Admitting: Otolaryngology

## 2017-02-14 ENCOUNTER — Other Ambulatory Visit: Payer: Self-pay | Admitting: Otolaryngology

## 2017-02-14 ENCOUNTER — Encounter (INDEPENDENT_AMBULATORY_CARE_PROVIDER_SITE_OTHER): Payer: Self-pay | Admitting: Vascular Surgery

## 2017-02-14 ENCOUNTER — Ambulatory Visit (INDEPENDENT_AMBULATORY_CARE_PROVIDER_SITE_OTHER): Payer: Medicare Other | Admitting: Vascular Surgery

## 2017-02-14 VITALS — BP 150/58 | HR 75 | Resp 16 | Wt 253.0 lb

## 2017-02-14 DIAGNOSIS — M7989 Other specified soft tissue disorders: Secondary | ICD-10-CM

## 2017-02-14 DIAGNOSIS — M47812 Spondylosis without myelopathy or radiculopathy, cervical region: Secondary | ICD-10-CM | POA: Insufficient documentation

## 2017-02-14 DIAGNOSIS — R059 Cough, unspecified: Secondary | ICD-10-CM

## 2017-02-14 DIAGNOSIS — I6523 Occlusion and stenosis of bilateral carotid arteries: Secondary | ICD-10-CM

## 2017-02-14 DIAGNOSIS — I700 Atherosclerosis of aorta: Secondary | ICD-10-CM | POA: Insufficient documentation

## 2017-02-14 DIAGNOSIS — R05 Cough: Secondary | ICD-10-CM | POA: Diagnosis not present

## 2017-02-14 DIAGNOSIS — N183 Chronic kidney disease, stage 3 unspecified: Secondary | ICD-10-CM | POA: Insufficient documentation

## 2017-02-14 DIAGNOSIS — F458 Other somatoform disorders: Secondary | ICD-10-CM | POA: Diagnosis present

## 2017-02-14 DIAGNOSIS — E1140 Type 2 diabetes mellitus with diabetic neuropathy, unspecified: Secondary | ICD-10-CM

## 2017-02-14 DIAGNOSIS — I10 Essential (primary) hypertension: Secondary | ICD-10-CM

## 2017-02-14 DIAGNOSIS — I6529 Occlusion and stenosis of unspecified carotid artery: Secondary | ICD-10-CM | POA: Diagnosis not present

## 2017-02-14 DIAGNOSIS — I890 Lymphedema, not elsewhere classified: Secondary | ICD-10-CM | POA: Diagnosis not present

## 2017-02-14 NOTE — Progress Notes (Signed)
MRN : [id number]  Teresa Krueger is a 83 y.o. ([birth year]) female who presents with chief complaint of  Chief Complaint  Patient presents with  . Follow-up    pt had a mini stroke  .  History of Present Illness: Patient returns in follow-up earlier than her scheduled visit due to TIA symptoms last week. She developed expressive aphasia that lasted about 5-10 minutes. She also reports swallowing difficulties which persists as well as lethargy which persists. She had no other changes or inciting events that caused the symptoms. This was about a week ago. She reports no fever or chills. Her leg swelling is actually been a little bit better. She has a known history of mild right carotid artery stenosis and moderate left carotid artery stenosis and is scheduled to have her carotid arteries checked at some point in the future but she did not know when. She was offered a CT angiogram at the hospital, but because of her chronic kidney disease she declined this.  Current Outpatient Prescriptions  Medication Sig Dispense Refill  . Acetaminophen (TYLENOL EXTRA STRENGTH PO) Take by mouth.    . ALPRAZolam (XANAX) 0.5 MG tablet Take 1 tablet (0.5 mg total) by mouth 3 (three) times daily as needed for anxiety. 60 tablet 0  . AMBULATORY NON FORMULARY MEDICATION Medication Name: incentive spirometry use 10-15 times daily. Dx:J47.9 1 each 0  . Blood Glucose Monitoring Suppl (FIFTY50 GLUCOSE METER 2.0) w/Device KIT Use as directed. Bayer Contour glucometer Dx E11.8    . clopidogrel (PLAVIX) 75 MG tablet Take 1 tablet (75 mg total) by mouth daily. 30 tablet 0  . conjugated estrogens (PREMARIN) vaginal cream Place 1 Applicatorful vaginally daily. Apply 0.'5mg'$  (pea-sized amount)  just inside the vaginal introitus with a finger-tip every night for two weeks and then Monday, Wednesday and Friday nights. 30 g 12  . fluticasone (FLONASE) 50 MCG/ACT nasal spray Place 2 sprays into both nostrils daily. (Patient taking  differently: Place 2 sprays into both nostrils daily as needed for rhinitis. ) 48 g 2  . glucose blood (BAYER CONTOUR TEST) test strip Use one strip to check glucose three times daily    . insulin lispro protamine-lispro (HUMALOG 75/25 MIX) (75-25) 100 UNIT/ML SUSP injection Inject 15 Units into the skin 2 (two) times daily. (Patient taking differently: Inject 10-12 Units into the skin 2 (two) times daily. ) 10 mL 11  . KLOR-CON M20 20 MEQ tablet Take 20 mEq by mouth every other day.     . lidocaine (LIDODERM) 5 % Place 2 patches onto the skin daily. Remove & Discard patch within 12 hours or as directed by MD    . losartan (COZAAR) 25 MG tablet Take 25 mg by mouth daily.    . mometasone (ELOCON) 0.1 % lotion Reported on 12/05/2015    . Multiple Vitamin (MULTIVITAMIN WITH MINERALS) TABS tablet Take 1 tablet by mouth daily.    . mupirocin ointment (BACTROBAN) 2 % Apply 1 application topically daily. Reported on 12/05/2015    . omeprazole (PRILOSEC) 20 MG capsule Take 1 capsule (20 mg total) by mouth 2 (two) times daily. 60 capsule 5  . pantoprazole (PROTONIX) 40 MG tablet Take by mouth.    . Respiratory Therapy Supplies (FLUTTER) DEVI Use as directed 1 each 0  . Simethicone (MYLANTA GAS PO) Take 1 tablet by mouth daily.     . simvastatin (ZOCOR) 20 MG tablet Take 20 mg by mouth daily.    Marland Kitchen spironolactone (  ALDACTONE) 25 MG tablet Take 25 mg by mouth every other day.     . torsemide (DEMADEX) 20 MG tablet Take 20 mg by mouth daily.     . traMADol (ULTRAM) 50 MG tablet Take 50 mg by mouth every 6 (six) hours as needed.     . triamcinolone ointment (KENALOG) 0.1 % Apply twice a day to affected areas     No current facility-administered medications for this visit.     Past Medical History:  Diagnosis Date  . Arthritis   . Asthma   . Barrett's esophagus   . Breast cancer (HCC) 1999   LT MASTECTOMY  . Bronchiectasis (HCC)   . COPD (chronic obstructive pulmonary disease) (HCC)   . Diabetes  mellitus (HCC)   . Hypercholesterolemia   . Hypertension   . Lymphedema   . Mini stroke (HCC)   . Osteoporosis   . Pacemaker     Past Surgical History:  Procedure Laterality Date  . CARPAL TUNNEL RELEASE     right  . CATARACT EXTRACTION     bilateral  . GALLBLADDER SURGERY    . MASTECTOMY Left 1999   BREAST CA  . PACEMAKER INSERTION Right 03/21/2015   Procedure: INSERTION PACEMAKER;  Surgeon: Marcina Millard, MD;  Location: [facility] ORS;  Service: Cardiovascular;  Laterality: Right;  . TOTAL HIP ARTHROPLASTY  2001   left  . TRIGGER FINGER RELEASE     bilateral    Social History      Social History  Substance Use Topics  . Smoking status: Former Smoker    Packs/day: 1.00    Years: 30.00    Types: Cigarettes    Quit date: 12/27/1992  . Smokeless tobacco: Never Used  . Alcohol use No  Married, husband accompanies today  Family History      Family History  Problem Relation Age of Onset  . Heart disease Paternal Grandmother   . Diabetes Paternal Grandmother   . Heart disease Father   . Heart disease Mother   . Heart disease Maternal Grandmother   . Stroke Maternal Aunt   . Breast cancer Paternal Aunt 15  . Lung cancer Brother 38  . Kidney disease Cousin   . Bladder Cancer Neg Hx          Allergies  Allergen Reactions  . Aspirin Swelling and Other (See Comments)    Pt states that she has tongue swelling.    . Celebrex [Celecoxib] Anaphylaxis  . Meloxicam Swelling  . Nsaids Anaphylaxis  . Rofecoxib Anaphylaxis  . Tolmetin Anaphylaxis  . Bactrim [Sulfamethoxazole-Trimethoprim] Hives and Itching  . Biguanide Copolymer Other (See Comments)    Reaction:  Unknown   . Buprenorphine Hcl Other (See Comments)    Reaction:  Unknown   . Ciprofloxacin Other (See Comments)    Reaction:  Unknown   . Codeine Nausea And Vomiting and Other (See Comments)    Reaction:  Syncope   . Delsym [Dextromethorphan Polistirex Er] Other (See Comments)      Reaction:  GI upset   . Dextromethorphan Hbr Other (See Comments)    Reaction:  GI upset   . Furosemide Other (See Comments)    Reaction:  Unknown   . Hydrocodone Other (See Comments)    Reaction:  Unknown   . Metformin Other (See Comments)    Reaction:  Unknown   . Morphine And Related Other (See Comments)    Reaction:  Unknown   . Penicillins Swelling and Other (See Comments)  Pt did not answer the additional questions for this medication because she does not remember.    . Pregabalin Other (See Comments)    Reaction:  Unknown   . Promethazine Other (See Comments)    Reaction:  Unknown   . Propoxyphene Other (See Comments)    Reaction:  Unknown   . Quinolones Other (See Comments)    Reaction:  Unknown   . Seroquel [Quetiapine Fumarate] Other (See Comments)    Reaction:  Unknown   . Serotonin Reuptake Inhibitors (Ssris) Other (See Comments)    Reaction:  Unknown   . Sulfa Antibiotics Hives and Itching  . Tramadol Nausea Only  . Ultracet [Tramadol-Acetaminophen] Other (See Comments)    Reaction:  Unknown      REVIEW OF SYSTEMS (Negative unless checked)  Constitutional: '[]'$ Weight loss  '[]'$ Fever  '[]'$ Chills Cardiac: '[]'$ Chest pain   '[]'$ Chest pressure   '[x]'$ Palpitations   '[]'$ Shortness of breath when laying flat   '[]'$ Shortness of breath at rest   '[x]'$ Shortness of breath with exertion. Vascular:  '[]'$ Pain in legs with walking   '[]'$ Pain in legs at rest   '[]'$ Pain in legs when laying flat   '[]'$ Claudication   '[]'$ Pain in feet when walking  '[]'$ Pain in feet at rest  '[]'$ Pain in feet when laying flat   '[]'$ History of DVT   '[]'$ Phlebitis   '[x]'$ Swelling in legs   '[]'$ Varicose veins   '[]'$ Non-healing ulcers Pulmonary:   '[]'$ Uses home oxygen   '[]'$ Productive cough   '[]'$ Hemoptysis   '[]'$ Wheeze  '[]'$ COPD   '[]'$ Asthma Neurologic:  '[]'$ Dizziness  '[]'$ Blackouts   '[]'$ Seizures   '[]'$ History of stroke   '[x]'$ History of TIA  '[]'$ Aphasia   '[]'$ Temporary blindness   '[]'$ Dysphagia   '[]'$ Weakness or numbness in arms    '[x]'$ Weakness or numbness in legs Musculoskeletal:  '[x]'$ Arthritis   '[]'$ Joint swelling   '[]'$ Joint pain   '[]'$ Low back pain Hematologic:  '[]'$ Easy bruising  '[]'$ Easy bleeding   '[]'$ Hypercoagulable state   '[]'$ Anemic  '[]'$ Hepatitis Gastrointestinal:  '[]'$ Blood in stool   '[]'$ Vomiting blood  '[]'$ Gastroesophageal reflux/heartburn   '[]'$ Difficulty swallowing. Genitourinary:  '[x]'$ Chronic kidney disease   '[]'$ Difficult urination  '[]'$ Frequent urination  '[]'$ Burning with urination   '[]'$ Blood in urine Skin:  '[]'$ Rashes   '[]'$ Ulcers   '[]'$ Wounds Psychological:  '[]'$ History of anxiety   '[]'$  History of major depression.   Physical Examination  Vitals:   02/14/17 0915  BP: (!) 150/58  Pulse: 75  Resp: 16  Weight: 253 lb (114.8 kg)   Body mass index is 49.41 kg/m. Gen:  WD/WN, NAD Head: [place]/AT, No temporalis wasting. Ear/Nose/Throat: Hearing grossly intact, nares w/o erythema or drainage, trachea midline Eyes: Conjunctiva clear. Sclera non-icteric Neck: Supple.  No bruit or JVD.  Pulmonary:  Good air movement, respirations not labored, no use of accessory muscles Cardiac: Irregular Vascular:  Vessel Right Left  Radial Palpable Palpable                          PT Palpable Palpable  DP Palpable Palpable   Gastrointestinal: soft, non-tender/non-distended.  Musculoskeletal: M/S 5/5 throughout.  No deformity or atrophy. 1+ BLE edema. Uses a hovaround Neurologic: CN 2-12 intact. Sensation grossly intact in extremities.  Symmetrical.  Speech is fluent. Motor exam as listed above. Psychiatric: Judgment intact, Mood & affect appropriate for pt's clinical situation. Dermatologic: No rashes or ulcers noted.  No cellulitis or open wounds.      CBC Lab Results  Component Value Date   WBC 4.2 02/09/2017  HGB 12.9 02/09/2017   HCT 37.6 02/09/2017   MCV 85.4 02/09/2017   PLT 204 02/09/2017    BMET    Component Value Date/Time   NA 139 02/09/2017 1620   NA 141 03/18/2012 1341   K 4.6 02/09/2017 1620   K 4.9 03/18/2012 1341     CL 102 02/09/2017 1620   CL 104 03/18/2012 1341   CO2 29 02/09/2017 1620   CO2 29 03/18/2012 1341   GLUCOSE 141 (H) 02/09/2017 1620   GLUCOSE 64 (L) 03/18/2012 1341   BUN 31 (H) 02/09/2017 1620   BUN 34 (H) 03/18/2012 1341   CREATININE 1.10 (H) 02/09/2017 1620   CREATININE 1.40 (H) 04/06/2012 1359   CALCIUM 9.9 02/09/2017 1620   CALCIUM 9.4 03/18/2012 1341   GFRNONAA 46 (L) 02/09/2017 1620   GFRNONAA 36 (L) 04/06/2012 1359   GFRAA 53 (L) 02/09/2017 1620   GFRAA 42 (L) 04/06/2012 1359   Estimated Creatinine Clearance: 46.4 mL/min (A) (by C-G formula based on SCr of 1.1 mg/dL (H)).  COAG Lab Results  Component Value Date   INR 1.09 10/14/2016   INR 1.16 03/20/2015    Radiology Ct Head Wo Contrast  Result Date: 02/09/2017 CLINICAL DATA:  Sudden onset of slurred speech.  Weakness. EXAM: CT HEAD WITHOUT CONTRAST TECHNIQUE: Contiguous axial images were obtained from the base of the skull through the vertex without intravenous contrast. COMPARISON:  October 14, 2016 FINDINGS: Brain: No subdural, epidural, or subarachnoid hemorrhage. Cerebellum, brainstem, and basal cisterns are normal. Ventricles and sulci are unchanged. Scattered white matter changes. No acute cortical ischemia or infarct. No mass effect or midline shift. Vascular: No hyperdense vessel or unexpected calcification. Skull: Normal. Negative for fracture or focal lesion. Sinuses/Orbits: No acute finding. Other: None. IMPRESSION: No acute intracranial abnormality to explain the patient's symptoms. Electronically Signed   By: Dorise Bullion III M.D   On: 02/09/2017 16:32      Assessment/Plan Diabetes mellitus blood glucose control important in reducing the progression of atherosclerotic disease. Also, involved in wound healing. On appropriate medications.   Morbid obesity Would contribute to lower extremity symptoms.  Swelling of limb The patient has prominent swelling and stasis changes to both lower extremities.  This is likely multifactorial including her morbid obesity, immobility with dependent extremities, but venous insufficiency may be playing a major contributing role. We discussed the role of leg elevation and compression stockings. Her leg swelling is actually doing better.  Continue current care.  Lymphedema The patient has left upper extremity lymphedema. I believe she likely has bilateral lower extremity lymphedema secondary to previous cellulitis, chronic swelling and stasis changes, and scarring of the lymphatic channels. Recommend compression and elevation as tolerated.  Benign essential hypertension blood pressure control important in reducing the progression of atherosclerotic disease. On appropriate oral medications.   CKD (chronic kidney disease) stage 3, GFR 30-59 ml/min She declined CT angio at the hospital for this reason which is reasonable  Carotid artery stenosis The patient has a known history of carotid disease but now has new TIA symptoms which be worrisome for left-sided TIA from carotid disease. We're going to move up her carotid ultrasound Week. Pending these results, we can either then consider a CT angiogram for further evaluation or continued medical management with Plavix.    Leotis Pain, MD  02/14/2017 10:50 AM    This note was created with Dragon medical transcription system.  Any errors from dictation are purely unintentional

## 2017-02-14 NOTE — Assessment & Plan Note (Signed)
The patient has a known history of carotid disease but now has new TIA symptoms which be worrisome for left-sided TIA from carotid disease. We're going to move up her carotid ultrasound Week. Pending these results, we can either then consider a CT angiogram for further evaluation or continued medical management with Plavix.

## 2017-02-14 NOTE — Assessment & Plan Note (Signed)
She declined CT angio at the hospital for this reason which is reasonable

## 2017-02-14 NOTE — Patient Instructions (Signed)
Aphasia  Aphasia is damage to the part of your brain that you need to communicate. For most people, that area is on the left side of the brain. Aphasia does not affect your intelligence, but you may struggle to talk, understand speech, read, or write. Aphasia can happen to anyone at any age, but it is most common in older age.  What are the causes?  An interruption of blood supply to the brain (stroke) is the most common cause of aphasia. Any disease or disorder that damages the communication areas of the brain can cause aphasia. This includes:   Brain tumors.   Brain injuries.   Brain infections.   Progressive diseases of the nervous system (neurological disorders).    What increases the risk?  You may be at risk for aphasia if you have had any trauma, disease, or disorder that damaged the communication areas of the brain.  What are the signs or symptoms?  Aphasia may start suddenly if it is caused by a stroke or brain injury. Aphasia caused by a tumor or a progressive neurological disorder may start gradually. The condition affects people differently. Signs and symptoms of aphasia include:   Trouble finding the right word.   Using the wrong words.   Talking in sentences that do not make sense.   Making up words.   Being unable to understand other people's speech.   Having problems writing, spelling, or reading.   Having trouble with numbers.   Having trouble swallowing.    How is this diagnosed?  Your health care provider may suspect you have aphasia if you lose the ability to speak or understand language. You may need to see a specialist (speech and language pathologist) to help determine the diagnosis of aphasia. This person may do a series of tests to check your ability to:   Speak.   Express ideas.   Make conversation.   Understand speech.   Read and write.    How is this treated?  In some cases, aphasia may improve on its own over time. Treatment for aphasia usually involves therapy with a  pathologist. Your treatment will be designed to meet your needs and abilities. Common treatments include:   Speech therapy.   Learning other ways to communicate.   Working with family members to find the best ways to communicate.   Working with an occupational therapist to find ways to communicate at work.    Follow these instructions at home:   Keep all follow-up appointments.   Make sure you have a good support system at home.   The following techniques may be helpful while communicating:  ? Use short, simple sentences. Ask family members to do the same. Sentences that require one-word answers are easiest.  ? Avoid distractions like background noise when trying to listen or talk.  ? Try communicating with gestures, pointing, or drawing.  ? Talk slowly. Ask family members to talk to you slowly.  ? Maintain eye contact when communicating.  Contact a health care provider if:   Your symptoms change or get worse.   You need more support at home.   You are struggling with anxiety or depression.   You develop trouble swallowing.  This information is not intended to replace advice given to you by your health care provider. Make sure you discuss any questions you have with your health care provider.  Document Released: 03/02/2002 Document Revised: 12/29/2015 Document Reviewed: 08/30/2013  Elsevier Interactive Patient Education  2018 [employer]

## 2017-02-14 NOTE — Assessment & Plan Note (Signed)
blood pressure control important in reducing the progression of atherosclerotic disease. On appropriate oral medications.  

## 2017-02-17 ENCOUNTER — Ambulatory Visit (INDEPENDENT_AMBULATORY_CARE_PROVIDER_SITE_OTHER): Payer: Medicare Other

## 2017-02-17 DIAGNOSIS — I6523 Occlusion and stenosis of bilateral carotid arteries: Secondary | ICD-10-CM

## 2017-02-18 ENCOUNTER — Other Ambulatory Visit: Payer: Self-pay | Admitting: Internal Medicine

## 2017-02-18 DIAGNOSIS — G459 Transient cerebral ischemic attack, unspecified: Secondary | ICD-10-CM

## 2017-02-18 DIAGNOSIS — R1310 Dysphagia, unspecified: Secondary | ICD-10-CM

## 2017-02-28 ENCOUNTER — Telehealth (INDEPENDENT_AMBULATORY_CARE_PROVIDER_SITE_OTHER): Payer: Self-pay

## 2017-02-28 ENCOUNTER — Telehealth (INDEPENDENT_AMBULATORY_CARE_PROVIDER_SITE_OTHER): Payer: Self-pay | Admitting: Vascular Surgery

## 2017-02-28 NOTE — Telephone Encounter (Signed)
Discussed patient and her concerns with Dr. Lucky Cowboy. He recommends an angiogram be performed. Patient called and made aware of Dr. Bunnie Domino recommendation. She states that she will call back after she reviews her schedule to determine when she can have this done.

## 2017-02-28 NOTE — Telephone Encounter (Signed)
Patient called asking about her results from her ultrasound that was done a while back, says she was told that she would hear something soon.

## 2017-02-28 NOTE — Telephone Encounter (Signed)
Called patient back to let her know the options of having the CT carotid done or the catheter based angiogram Carotid done. The patient will call back one day next week to get scheduled for one of the procedures once she has had time to think it over.

## 2017-02-28 NOTE — Telephone Encounter (Signed)
Returned patient's call regarding her carotid U/S results. Discussed that her U/S demonstrated a 60-79% right sided carotid stenosis. Informed her that at this level the next appropriate steps would be to obtain more detailed imaging through a CT scan with contrast. Patient voiced concerns over receiving contrast dye and its effect on her kidney function which has previously shown decline. Discussed that another potential way of obtaining a more detailed exam would be through an angiogram. Discussed risks and benefits of both of the routes of examination. Patient verbalized understanding of each and requests that Dr. Lucky Cowboy make the final determination on what method of testing would be preferred. I informed her I would f/u with Dr. Lucky Cowboy and call her back. Pt is in agreeable to this plan and will await my call.

## 2017-03-03 ENCOUNTER — Ambulatory Visit: Admission: RE | Admit: 2017-03-03 | Payer: Medicare Other | Source: Ambulatory Visit

## 2017-03-27 ENCOUNTER — Ambulatory Visit: Payer: Medicare Other | Admitting: Urology

## 2017-04-03 ENCOUNTER — Encounter: Payer: Self-pay | Admitting: Emergency Medicine

## 2017-04-03 ENCOUNTER — Emergency Department
Admission: EM | Admit: 2017-04-03 | Discharge: 2017-04-03 | Disposition: A | Discharge status: Home / Self Care | Payer: Medicare Other | Attending: Emergency Medicine | Admitting: Emergency Medicine

## 2017-04-03 DIAGNOSIS — J449 Chronic obstructive pulmonary disease, unspecified: Secondary | ICD-10-CM | POA: Diagnosis not present

## 2017-04-03 DIAGNOSIS — S8992XA Unspecified injury of left lower leg, initial encounter: Secondary | ICD-10-CM | POA: Diagnosis present

## 2017-04-03 DIAGNOSIS — Z8673 Personal history of transient ischemic attack (TIA), and cerebral infarction without residual deficits: Secondary | ICD-10-CM | POA: Diagnosis not present

## 2017-04-03 DIAGNOSIS — Z79899 Other long term (current) drug therapy: Secondary | ICD-10-CM | POA: Insufficient documentation

## 2017-04-03 DIAGNOSIS — Z7902 Long term (current) use of antithrombotics/antiplatelets: Secondary | ICD-10-CM | POA: Diagnosis not present

## 2017-04-03 DIAGNOSIS — Y9301 Activity, walking, marching and hiking: Secondary | ICD-10-CM | POA: Insufficient documentation

## 2017-04-03 DIAGNOSIS — J45909 Unspecified asthma, uncomplicated: Secondary | ICD-10-CM | POA: Insufficient documentation

## 2017-04-03 DIAGNOSIS — N183 Chronic kidney disease, stage 3 (moderate): Secondary | ICD-10-CM | POA: Insufficient documentation

## 2017-04-03 DIAGNOSIS — S8012XA Contusion of left lower leg, initial encounter: Secondary | ICD-10-CM | POA: Insufficient documentation

## 2017-04-03 DIAGNOSIS — Y92094 Garage of other non-institutional residence as the place of occurrence of the external cause: Secondary | ICD-10-CM | POA: Insufficient documentation

## 2017-04-03 DIAGNOSIS — I129 Hypertensive chronic kidney disease with stage 1 through stage 4 chronic kidney disease, or unspecified chronic kidney disease: Secondary | ICD-10-CM | POA: Insufficient documentation

## 2017-04-03 DIAGNOSIS — E1122 Type 2 diabetes mellitus with diabetic chronic kidney disease: Secondary | ICD-10-CM | POA: Insufficient documentation

## 2017-04-03 DIAGNOSIS — Y999 Unspecified external cause status: Secondary | ICD-10-CM | POA: Diagnosis not present

## 2017-04-03 DIAGNOSIS — Z794 Long term (current) use of insulin: Secondary | ICD-10-CM | POA: Diagnosis not present

## 2017-04-03 DIAGNOSIS — W2209XA Striking against other stationary object, initial encounter: Secondary | ICD-10-CM | POA: Diagnosis not present

## 2017-04-03 DIAGNOSIS — S8011XA Contusion of right lower leg, initial encounter: Secondary | ICD-10-CM

## 2017-04-03 NOTE — Discharge Instructions (Signed)
Keep your wounds clean, dry, and covered with antibiotic ointment. Rest with the legs elevated when seated. Apply ice packs to promote healing. Follow-up with Dr. [name] as needed.

## 2017-04-03 NOTE — ED Triage Notes (Signed)
Pt is in motorized wheelchair and accidentally ran into ramp for van. Hit both shin on metal ramp. Swelling/redness/bruise noted. Pt is nonambulatory at baseline. Able to move feet without difficulty.

## 2017-04-04 ENCOUNTER — Other Ambulatory Visit: Payer: Self-pay | Admitting: Internal Medicine

## 2017-04-04 DIAGNOSIS — I6523 Occlusion and stenosis of bilateral carotid arteries: Secondary | ICD-10-CM

## 2017-04-04 NOTE — ED Provider Notes (Signed)
[facility] Emergency Department Provider Note ____________________________________________  Time seen: 1615  I have reviewed the triage vital signs and the nursing notes.  HISTORY  Chief Complaint  Leg Injury  HPI Teresa Krueger is a 83 y.o. female presents to the ED from home, accompanied by her husband, for evaluation of injury sustained after she accidentally ran into the lift on her [place]. Patient uses a motorized wheelchair, and was trying to position herself in the garage to into the [place] after the lift gate was lowered. She apparently was to chair into drive, and hit her bilateral anterior shin on the metal ramp. She reports bleeding which is now controlled to the left shin. She reports redness, and bruising noted bilaterally. She denies any other injury at this time.the patient takes Plavix daily and has a history of DM, CAD, HTN, TIAs, and pacemaker insertion.  Past Medical History:  Diagnosis Date  . Arthritis   . Asthma   . Barrett's esophagus   . Breast cancer ([place]) 1999   LT MASTECTOMY  . Bronchiectasis ([place])   . COPD (chronic obstructive pulmonary disease) ([place])   . Diabetes mellitus ([place])   . Hypercholesterolemia   . Hypertension   . Lymphedema   . Mini stroke ([place])   . Osteoporosis   . Pacemaker     Patient Active Problem List   Diagnosis Date Noted  . CKD (chronic kidney disease) stage 3, GFR 30-59 ml/min (HCC) 02/14/2017  . Carotid artery stenosis 10/29/2016  . TIA (transient ischemic attack) 10/14/2016  . Swelling of limb 04/10/2016  . Varicose veins of leg with pain, bilateral 04/10/2016  . Lymphedema 04/10/2016  . Osteoarthritis 03/19/2016  . Osteoporosis 03/19/2016  . Coronary artery calcification seen on CAT scan 02/08/2016  . Incidental lung nodule 02/08/2016  . Thoracic aortic atherosclerosis ([place]) 02/08/2016  . S/P placement of cardiac pacemaker 03/30/2015  . Morbid obesity ([place]) 03/17/2015  . Hypersomnia with sleep  apnea 03/17/2015  . Mobitz type 2 second degree AV block 03/16/2015  . Multiple pulmonary nodules 09/13/2014  . Lung field abnormal 09/13/2014  . Barrett's esophagus 12/21/2013  . History of adenomatous polyp of colon 12/21/2013  . Hx of adenomatous colonic polyps 12/21/2013  . Benign essential hypertension 11/30/2013  . Acromioclavicular joint arthritis 11/23/2013  . Glenohumeral arthritis 11/23/2013  . Chest pain 10/21/2013  . Adult body mass index 50.0-59.9 ([place]) 02/09/2013  . Morbid obesity with BMI of 50.0-59.9, adult ([place]) 02/09/2013  . Primary localized osteoarthrosis, lower leg 02/09/2013  . Bronchiectasis ([place]) 05/19/2012  . Chronic bronchitis 12/31/2011  . Cough 12/31/2011  . GERD (gastroesophageal reflux disease) 12/31/2011  . Diabetes mellitus ([place])   . Hypertension     Past Surgical History:  Procedure Laterality Date  . CARPAL TUNNEL RELEASE     right  . CATARACT EXTRACTION     bilateral  . GALLBLADDER SURGERY    . MASTECTOMY Left 1999   BREAST CA  . PACEMAKER INSERTION Right 03/21/2015   Procedure: INSERTION PACEMAKER;  Surgeon: Isaias Cowman, MD;  Location: [facility] ORS;  Service: Cardiovascular;  Laterality: Right;  . TOTAL HIP ARTHROPLASTY  2001   left  . TRIGGER FINGER RELEASE     bilateral    Prior to Admission medications   Medication Sig Start Date End Date Taking? Authorizing Provider  Acetaminophen (TYLENOL EXTRA STRENGTH PO) Take by mouth.    [provider]  ALPRAZolam Duanne Moron) 0.5 MG tablet Take 1 tablet (0.5 mg total) by mouth  3 (three) times daily as needed for anxiety. 03/22/15   Adin Hector, MD  AMBULATORY NON FORMULARY MEDICATION Medication Name: incentive spirometry use 10-15 times daily. Dx:J47.9 10/14/16   Flora Lipps, MD  Blood Glucose Monitoring Suppl (FIFTY50 GLUCOSE METER 2.0) w/Device KIT Use as directed. Bayer Contour glucometer Dx E11.8 05/07/16 05/07/17  [provider]  clopidogrel (PLAVIX) 75 MG tablet  Take 1 tablet (75 mg total) by mouth daily. 10/16/16   Fritzi Mandes, MD  conjugated estrogens (PREMARIN) vaginal cream Place 1 Applicatorful vaginally daily. Apply 0.57m (pea-sized amount)  just inside the vaginal introitus with a finger-tip every night for two weeks and then Monday, Wednesday and Friday nights. 12/27/15   MZara CouncilA, PA-C  fluticasone (FLONASE) 50 MCG/ACT nasal spray Place 2 sprays into both nostrils daily. Patient taking differently: Place 2 sprays into both nostrils daily as needed for rhinitis.  03/04/14 10/14/17  MJuanito Doom MD  glucose blood (BAYER CONTOUR TEST) test strip Use one strip to check glucose three times daily 04/04/16   [provider]  insulin lispro protamine-lispro (HUMALOG 75/25 MIX) (75-25) 100 UNIT/ML SUSP injection Inject 15 Units into the skin 2 (two) times daily. Patient taking differently: Inject 10-12 Units into the skin 2 (two) times daily.  03/22/15   KTama HighIII, MD  KLOR-CON M20 20 MEQ tablet Take 20 mEq by mouth every other day.  06/25/15   [provider]  lidocaine (LIDODERM) 5 % Place 2 patches onto the skin daily. Remove & Discard patch within 12 hours or as directed by MD    [provider]  losartan (COZAAR) 25 MG tablet Take 25 mg by mouth daily.    [provider]  mometasone (ELOCON) 0.1 % lotion Reported on 12/05/2015 11/08/15   [provider]  Multiple Vitamin (MULTIVITAMIN WITH MINERALS) TABS tablet Take 1 tablet by mouth daily.    [provider]  mupirocin ointment (BACTROBAN) 2 % Apply 1 application topically daily. Reported on 12/05/2015 11/08/15   [provider]  omeprazole (PRILOSEC) 20 MG capsule Take 1 capsule (20 mg total) by mouth 2 (two) times daily. 03/21/14   MJuanito Doom MD  pantoprazole (PROTONIX) 40 MG tablet Take by mouth. 10/23/16 10/23/17  [provider]  Respiratory Therapy Supplies (FLUTTER) DEVI Use as directed 02/12/16   KFlora Lipps MD  Simethicone (MYLANTA GAS PO) Take 1 tablet by mouth daily.     [provider]  simvastatin (ZOCOR) 20 MG tablet Take 20 mg by mouth daily.    [provider]  spironolactone (ALDACTONE) 25 MG tablet Take 25 mg by mouth every other day.     [provider]  torsemide (DEMADEX) 20 MG tablet Take 20 mg by mouth daily.  05/20/16   [provider]  traMADol (ULTRAM) 50 MG tablet Take 50 mg by mouth every 6 (six) hours as needed.  10/26/15   [provider]  triamcinolone ointment (KENALOG) 0.1 % Apply twice a day to affected areas 04/04/16   [provider]    Allergies Aspirin; Celebrex [celecoxib]; Meloxicam; Nsaids; Rofecoxib; Tolmetin; Bactrim [sulfamethoxazole-trimethoprim]; Biguanide copolymer; Buprenorphine hcl; Ciprofloxacin; Codeine; Delsym [dextromethorphan polistirex er]; Dextromethorphan hbr; Furosemide; Hydrocodone; Metformin; Morphine and related; Penicillins; Pregabalin; Promethazine; Propoxyphene; Quinolones; Seroquel [quetiapine fumarate]; Serotonin reuptake inhibitors (ssris); Sulfa antibiotics; Tramadol; and Ultracet [tramadol-acetaminophen]  Family History  Problem Relation Age of Onset  . Heart disease Paternal Grandmother   . Diabetes Paternal Grandmother   .  Heart disease Father   . Heart disease Mother   . Heart disease Maternal Grandmother   . Stroke Maternal Aunt   . Breast cancer Paternal Aunt 94  . Lung cancer Brother 71  . Kidney disease Cousin   . Bladder Cancer Neg Hx     Social History Social History  Substance Use Topics  . Smoking status: Former Smoker    Packs/day: 1.00    Years: 30.00    Types: Cigarettes    Quit date: 12/27/1992  . Smokeless tobacco: Never Used  . Alcohol use No    Review of Systems  Constitutional: Negative for fever. Cardiovascular: Negative for chest pain. Respiratory: Negative for shortness of breath. Musculoskeletal: Negative for back pain. Skin: Negative  for rash. Bruising and swelling to the shins bilaterally.  Neurological: Negative for headaches, focal weakness or numbness. ____________________________________________  PHYSICAL EXAM:  VITAL SIGNS: ED Triage Vitals  Enc Vitals Group     BP 04/03/17 1518 (!) 182/57     Pulse Rate 04/03/17 1518 73     Resp 04/03/17 1518 20     Temp 04/03/17 1518 98.4 F (36.9 C)     Temp Source 04/03/17 1518 Oral     SpO2 04/03/17 1518 95 %     Weight 04/03/17 1518 251 lb (113.9 kg)     Height 04/03/17 1518 5' (1.524 m)     Head Circumference --      Peak Flow --      Pain Score 04/03/17 1517 10     Pain Loc --      Pain Edu? --      Excl. in [place]? --     Constitutional: Alert and oriented. Well appearing and in no distress. Head: Normocephalic and atraumatic. Cardiovascular: Normal rate, regular rhythm. Normal distal pulses. Respiratory: Normal respiratory effort. No wheezes/rales/rhonchi. Musculoskeletal: Nontender with normal range of motion in all extremities.  Neurologic: Normal speech and language. No gross focal neurologic deficits are appreciated. Skin:  Skin is warm, dry and intact. No rash noted. Left shin with a superficially abraded skin with underlying hematoma noted distally. No active bleeding. Tender to palpation. Right shin with a smaller, linear bruise forming superficially. No palpable hematoma or abrasion noted.  ____________________________________________  PROCEDURES  Wound cleansing with soap & saline Dry, sterile dressing applied (petroleum-soaked gauze, 4x4, tube gauze ____________________________________________  INITIAL IMPRESSION / ASSESSMENT AND PLAN / ED COURSE  Geriatric patient with the ED evaluation of injury sustained to the bilateral shins, after she accidentally ran into the left deck of her mobility van. Patient sustained superficial contusions with underlying hematoma to the lower legs. Bleeding is currently controlled, and dressings were appropriately  cleansed and dressed with dry sterile bandages. Patient is given wound care instructions, and supplies for 2 days. She will rest the legs elevated and apply ice packs to reduce pain and swelling. She may dose Tylenol as needed for pain relief. She should follow-up with her primary care provider on going since the management. ____________________________________________  FINAL CLINICAL IMPRESSION(S) / ED DIAGNOSES  Final diagnoses:  Hematoma of leg, left, initial encounter  Contusion of leg, right, initial encounter      Melvenia Needles, PA-C 04/04/17 1735    Delman Kitten, MD 04/05/17 [id number]

## 2017-04-07 ENCOUNTER — Encounter: Payer: Self-pay | Admitting: Internal Medicine

## 2017-04-07 ENCOUNTER — Ambulatory Visit (INDEPENDENT_AMBULATORY_CARE_PROVIDER_SITE_OTHER): Payer: Medicare Other | Admitting: Internal Medicine

## 2017-04-07 VITALS — BP 116/58 | HR 71 | Ht 60.0 in | Wt 255.0 lb

## 2017-04-07 DIAGNOSIS — I6523 Occlusion and stenosis of bilateral carotid arteries: Secondary | ICD-10-CM | POA: Diagnosis not present

## 2017-04-07 DIAGNOSIS — J9611 Chronic respiratory failure with hypoxia: Secondary | ICD-10-CM | POA: Diagnosis not present

## 2017-04-07 NOTE — Progress Notes (Signed)
Subjective:    Patient ID: Teresa Krueger, female    DOB: [birth year], 83 y.o.   MRN: [id number]  Synopsis: emilya justen first saw the [facility] pulmonary clinic in July 2013 for cough. She has a one pack per day smoking history x30 years and quit in the 1990s. She had simple spirometry in July 2013 which showed no evidence of COPD. She has a history of childhood pertussis  She has postnasal drip and gastroesophageal reflux disease with Barrett's esophagus.   CC: follow up for Bronchiectasis  HPI  No complaints, doing really well, chronic cough and DOE,SOB She had a CT angiogram of her chest feb 2016 recently which was negative for PE but showed some small 68mm nodules.   I also saw bronchiectasis.- repeat CT 07/2015 shows stable nodules- Repeat CT chest 01/2016-shows stable nodules  Uses flutter valve once daily, uses albuterol infrequently No signs of infection at this time SOB/DOE same as before no change She is wheelchair bound S/p pacemaker placement   Review of Systems  Constitutional: Negative for chills, diaphoresis, fatigue and fever.  HENT: Negative for congestion, postnasal drip and rhinorrhea.   Respiratory: Negative for cough, chest tightness, shortness of breath, wheezing and stridor.   Cardiovascular: Positive for leg swelling. Negative for chest pain.  All other systems reviewed and are negative.     Objective:   Physical Exam  Constitutional: She appears well-developed and well-nourished. No distress.  HENT:  Head: Normocephalic and atraumatic.  Eyes: Pupils are equal, round, and reactive to light.  Neck: Normal range of motion. Neck supple.  Cardiovascular: Normal rate and regular rhythm.   No murmur heard. Pulmonary/Chest: Effort normal and breath sounds normal. No respiratory distress. She has no wheezes. She has no rales.  Musculoskeletal: She exhibits edema.  Skin: She is not diaphoretic.   BP (!) 116/58 (BP Location: Left Arm, Cuff Size:  Normal)   Pulse 71   Ht 5' (1.524 m)   Wt 255 lb (115.7 kg)   SpO2 98%   BMI 49.80 kg/m    04/2012 CT chest >> no emphysema, there is some bronchiectasis in the RML and Lingula ([place] read) 04/2012 CT sinuses >> no significant signs of sinusitis 04/2012 CT chest [facility] >> no emphysema or nodules, mild RML and lingula bronchiectasis  04/2012 AFB culture x3 negative February 2016 CT angiogram chest> mild lingular bronchiectasis, scattered pulmonary nodules bilaterally, largest is 6 mm, no pulmonary embolism Feb 2017 CTchest stabel nodules, recommend repeat in 6 months. 01/2016 Ct chest 1. Stable noncalcified subpleural nodule in the periphery of the right upper lobe.  2. Moderate thoracic aortic atherosclerosis. 3. Calcifications in the distribution of the left anterior descending and right coronary arteries.   Assessment & Plan:  83 yo obese white female with Bronchiectasis that seems to be stable at this time with chronic hypoxic resp failure  1.Bronchiectasis -mild bronchiectasis. She has not had an exacerbation recently.  -advised to use flutter valve more frequently during the day -continue oxygen as needed at night -incentive spirometry 10-15 times per day  2.Multiple pulmonary nodules She was noted to have multiple pulmonary nodules on her  CT chest. None of these are larger than 6 mm 1 year ago. -She has a heavy smoking history but has been greater than 30 years and she quit smoking.  -repeat Ct chest shows stable nodules will repeat in 1 year.  Follow up in 6 months  Patient/Family are satisfied with Plan of action and management.  All questions answered  Corrin Parker, M.D.  [facility]  Medical Director [place] Director [facility] Cardio-Pulmonary Department

## 2017-04-07 NOTE — Patient Instructions (Signed)
Continue flutter valve as needed Continue oxygen as prescribed

## 2017-04-14 ENCOUNTER — Encounter: Payer: Medicare Other | Attending: Surgery | Admitting: Surgery

## 2017-04-14 DIAGNOSIS — I10 Essential (primary) hypertension: Secondary | ICD-10-CM | POA: Diagnosis not present

## 2017-04-14 DIAGNOSIS — S81812A Laceration without foreign body, left lower leg, initial encounter: Secondary | ICD-10-CM | POA: Diagnosis not present

## 2017-04-14 DIAGNOSIS — Z6841 Body Mass Index (BMI) 40.0 and over, adult: Secondary | ICD-10-CM | POA: Diagnosis not present

## 2017-04-14 DIAGNOSIS — Z993 Dependence on wheelchair: Secondary | ICD-10-CM | POA: Insufficient documentation

## 2017-04-14 DIAGNOSIS — L97222 Non-pressure chronic ulcer of left calf with fat layer exposed: Secondary | ICD-10-CM | POA: Diagnosis not present

## 2017-04-14 DIAGNOSIS — M109 Gout, unspecified: Secondary | ICD-10-CM | POA: Diagnosis not present

## 2017-04-14 DIAGNOSIS — Z794 Long term (current) use of insulin: Secondary | ICD-10-CM | POA: Insufficient documentation

## 2017-04-14 DIAGNOSIS — J449 Chronic obstructive pulmonary disease, unspecified: Secondary | ICD-10-CM | POA: Diagnosis not present

## 2017-04-14 DIAGNOSIS — X58XXXA Exposure to other specified factors, initial encounter: Secondary | ICD-10-CM | POA: Insufficient documentation

## 2017-04-14 DIAGNOSIS — K2270 Barrett's esophagus without dysplasia: Secondary | ICD-10-CM | POA: Insufficient documentation

## 2017-04-14 DIAGNOSIS — E11622 Type 2 diabetes mellitus with other skin ulcer: Secondary | ICD-10-CM | POA: Diagnosis present

## 2017-04-14 DIAGNOSIS — M1990 Unspecified osteoarthritis, unspecified site: Secondary | ICD-10-CM | POA: Diagnosis not present

## 2017-04-14 DIAGNOSIS — Z881 Allergy status to other antibiotic agents status: Secondary | ICD-10-CM | POA: Insufficient documentation

## 2017-04-14 DIAGNOSIS — E1140 Type 2 diabetes mellitus with diabetic neuropathy, unspecified: Secondary | ICD-10-CM | POA: Insufficient documentation

## 2017-04-14 DIAGNOSIS — Z885 Allergy status to narcotic agent status: Secondary | ICD-10-CM | POA: Diagnosis not present

## 2017-04-14 DIAGNOSIS — M70962 Unspecified soft tissue disorder related to use, overuse and pressure, left lower leg: Secondary | ICD-10-CM | POA: Diagnosis not present

## 2017-04-14 DIAGNOSIS — Z950 Presence of cardiac pacemaker: Secondary | ICD-10-CM | POA: Diagnosis not present

## 2017-04-14 DIAGNOSIS — Z87891 Personal history of nicotine dependence: Secondary | ICD-10-CM | POA: Diagnosis not present

## 2017-04-14 DIAGNOSIS — G4730 Sleep apnea, unspecified: Secondary | ICD-10-CM | POA: Diagnosis not present

## 2017-04-14 NOTE — Progress Notes (Addendum)
Teresa Krueger, Teresa Krueger ([id number]) Visit Report for 04/14/2017 Chief Complaint Document Details Patient Name: Teresa Krueger, Teresa Krueger Date of Service: 04/14/2017 8:00 AM Medical Record Number: [id number] Patient Account Number: [id number] Date of Birth/Sex: 20-Sep-1933 (83 y.o. Female) Treating RN: Cornell Barman Primary Care Provider: Ramonita Lab Other Clinician: Referring Provider: Ramonita Lab Treating Provider/Extender: Frann Rider in Treatment: 0 Information Obtained from: Patient Chief Complaint Patients presents for treatment of an open diabetic ulcer to the left lower extremity caused by trauma 2 weeks ago Electronic Signature(s) Signed: 04/14/2017 9:26:25 AM By: Christin Fudge MD, FACS Entered By: Christin Fudge on 04/14/2017 09:26:25 Teresa Krueger ([id number]) -------------------------------------------------------------------------------- Debridement Details Patient Name: Teresa Krueger Date of Service: 04/14/2017 8:00 AM Medical Record Number: [id number] Patient Account Number: [id number] Date of Birth/Sex: 11-16-1933 (83 y.o. Female) Treating RN: Cornell Barman Primary Care Provider: Ramonita Lab Other Clinician: Referring Provider: Ramonita Lab Treating Provider/Extender: Frann Rider in Treatment: 0 Debridement Performed for Wound #1 Left,Medial Lower Leg Assessment: Performed By: Physician Christin Fudge, MD Debridement: Debridement Severity of Tissue Pre Fat layer exposed Debridement: Pre-procedure Verification/Time Yes - 09:08 Out Taken: Start Time: 09:08 Pain Control: Other : lidocaine 4% Level: Skin/Subcutaneous Tissue Total Area Debrided (L x W): 1.5 (cm) x 5.5 (cm) = 8.25 (cm) Tissue and other material Viable, Non-Viable, Blood Clots, Exudate, Fibrin/Slough, Skin, Subcutaneous debrided: Instrument: Forceps, Scissors Bleeding: Moderate Hemostasis Achieved: Pressure End Time: 09:09 Procedural Pain: 1 Post Procedural Pain: 1 Response to  Treatment: Procedure was tolerated well Post Debridement Measurements of Total Wound Length: (cm) 1.5 Width: (cm) 5.5 Depth: (cm) 2 Volume: (cm) 12.959 Character of Wound/Ulcer Post Debridement: Requires Further Debridement Severity of Tissue Post Debridement: Fat layer exposed Post Procedure Diagnosis Same as Pre-procedure Electronic Signature(s) Signed: 04/25/2017 10:14:59 AM By: Gretta Cool, BSN, RN, CWS, Kim RN, BSN Signed: 04/25/2017 4:21:36 PM By: Christin Fudge MD, FACS Previous Signature: 04/14/2017 9:26:00 AM Version By: Christin Fudge MD, FACS Previous Signature: 04/14/2017 4:56:47 PM Version By: Gretta Cool, BSN, RN, CWS, Kim RN, BSN Entered By: Gretta Cool, BSN, RN, CWS, Kim on 04/25/2017 10:14:59 Teresa Krueger ([id number]) -------------------------------------------------------------------------------- HPI Details Patient Name: Teresa Krueger Date of Service: 04/14/2017 8:00 AM Medical Record Number: [id number] Patient Account Number: [id number] Date of Birth/Sex: 06-14-1934 (83 y.o. Female) Treating RN: Cornell Barman Primary Care Provider: Ramonita Lab Other Clinician: Referring Provider: Ramonita Lab Treating Provider/Extender: Frann Rider in Treatment: 0 History of Present Illness Location: left lower extremity Quality: Patient reports experiencing a sharp pain to affected area(s). Severity: Patient states wound are getting worse. Duration: Patient has had the wound for < 2 weeks prior to presenting for treatment Timing: Pain in wound is Intermittent (comes and goes Context: The wound occurred when the patient had a blunt injury to the left lower extremity which caused a large hematoma Modifying Factors: Other treatment(s) tried include:has been put on oral antibiotics by her PCP Associated Signs and Symptoms: Patient reports having increase swelling. HPI Description: 83 year old patient, known to be a diabetic was recently seen by her PCP regarding an injury to the  left lower extremity which she has had about 2 weeks. She injured it using an electric wheelchair and hitting the ramp on her [place] Lei and had an x-ray in the ER which was negative for any fracture. The patient however says she has not had any x-ray done in the ER. I did not find any x-ray reported in her electronic medical record. She has minimal drainage from the wound and it was covered  with eschar. Past medical history significant for breast cancer, COPD, diabetes mellitus type 2, essential hypertension, morbid obesity, status post cardiac pacemaker, cholecystectomy, mastectomy, EGD for Barrett's esophagus. Patient was given doxycycline for 10 days and referred to the wound center. the patient has had a bilateral lower extremity venous reflux examination done in February 2018 and it was showing no venous incompetence bilaterally Electronic Signature(s) Signed: 04/14/2017 9:34:31 AM By: Christin Fudge MD, FACS Previous Signature: 04/14/2017 9:27:55 AM Version By: Christin Fudge MD, FACS Previous Signature: 04/14/2017 8:33:24 AM Version By: Christin Fudge MD, FACS Previous Signature: 04/14/2017 8:33:07 AM Version By: Christin Fudge MD, FACS Entered By: Christin Fudge on 04/14/2017 09:34:30 Teresa Krueger ([id number]) -------------------------------------------------------------------------------- Physical Exam Details Patient Name: Teresa Krueger Date of Service: 04/14/2017 8:00 AM Medical Record Number: [id number] Patient Account Number: [id number] Date of Birth/Sex: [birth year] (83 y.o. Female) Treating RN: Cornell Barman Primary Care Provider: Ramonita Lab Other Clinician: Referring Provider: Ramonita Lab Treating Provider/Extender: Frann Rider in Treatment: 0 Constitutional . Pulse regular. Respirations normal and unlabored. Afebrile. . Eyes Nonicteric. Reactive to light. Ears, Nose, Mouth, and Throat Lips, teeth, and gums WNL.Marland Kitchen Moist mucosa without lesions. Neck supple and  nontender. No palpable supraclavicular or cervical adenopathy. Normal sized without goiter. Respiratory WNL. No retractions.. Cardiovascular Pedal Pulses WNL. ABI could not be measured. No clubbing, cyanosis or edema. Gastrointestinal (GI) Abdomen without masses or tenderness.. No liver or spleen enlargement or tenderness.. Lymphatic No adneopathy. No adenopathy. No adenopathy. Musculoskeletal Adexa without tenderness or enlargement.. Digits and nails w/o clubbing, cyanosis, infection, petechiae, ischemia, or inflammatory conditions.. Integumentary (Hair, Skin) No suspicious lesions. No crepitus or fluctuance. No peri-wound warmth or erythema. No masses.Marland Kitchen Psychiatric Judgement and insight Intact.. No evidence of depression, anxiety, or agitation.. Notes large area of hematoma to the left anterior shin and after sharp debridement with forceps and scissors a lot of hematoma was removed but the depth is still significant and due to excessive tenderness I could not proceed with any more debridement. Electronic Signature(s) Signed: 04/14/2017 9:28:43 AM By: Christin Fudge MD, FACS Entered By: Christin Fudge on 04/14/2017 18:56:31 Teresa Krueger ([id number]) -------------------------------------------------------------------------------- Physician Orders Details Patient Name: Teresa Krueger Date of Service: 04/14/2017 8:00 AM Medical Record Number: [id number] Patient Account Number: [id number] Date of Birth/Sex: May 01, 1934 (83 y.o. Female) Treating RN: Cornell Barman Primary Care Provider: Ramonita Lab Other Clinician: Referring Provider: Ramonita Lab Treating Provider/Extender: Frann Rider in Treatment: 0 Verbal / Phone Orders: No Diagnosis Coding Wound Cleansing Wound #1 Left,Medial Lower Leg o Clean wound with Normal Saline. o May Shower, gently pat wound dry prior to applying new dressing. Anesthetic Wound #1 Left,Medial Lower Leg o Topical Lidocaine 4% cream  applied to wound bed prior to debridement Primary Wound Dressing Wound #1 Left,Medial Lower Leg o Aquacel Ag Secondary Dressing Wound #1 Left,Medial Lower Leg o Boardered Foam Dressing Dressing Change Frequency Wound #1 Left,Medial Lower Leg o Change dressing every other day. Follow-up Appointments Wound #1 Left,Medial Lower Leg o Return Appointment in 1 week. Edema Control Wound #1 Left,Medial Lower Leg o Elevate legs to the level of the heart and pump ankles as often as possible Medications-please add to medication list. Wound #1 Left,Medial Lower Leg o P.O. Antibiotics - Continue with antibiotics. Electronic Signature(s) Signed: 04/14/2017 12:49:35 PM By: Christin Fudge MD, FACS Signed: 04/14/2017 4:56:47 PM By: Gretta Cool, BSN, RN, CWS, Kim RN, BSN Entered By: Gretta Cool, BSN, RN, CWS, Kim on 04/14/2017 09:16:12 Teresa Krueger ([id number]) -------------------------------------------------------------------------------- Problem List  Details Patient Name: DEEM, MARMOL Date of Service: 04/14/2017 8:00 AM Medical Record Number: [id number] Patient Account Number: [id number] Date of Birth/Sex: 02-01-1934 (83 y.o. Female) Treating RN: Cornell Barman Primary Care Provider: Ramonita Lab Other Clinician: Referring Provider: Ramonita Lab Treating Provider/Extender: Frann Rider in Treatment: 0 Active Problems ICD-10 Encounter Code Description Active Date Diagnosis E11.622 Type 2 diabetes mellitus with other skin ulcer 04/14/2017 Yes L97.222 Non-pressure chronic ulcer of left calf with fat layer exposed 04/14/2017 Yes S81.812A Laceration without foreign body, left lower leg, initial encounter 04/14/2017 Yes M70.962 Unspecified soft tissue disorder related to use, overuse and 04/14/2017 Yes pressure, left lower leg E66.01 Morbid (severe) obesity due to excess calories 04/14/2017 Yes Z99.3 Dependence on wheelchair 04/14/2017 Yes Inactive Problems Resolved  Problems Electronic Signature(s) Signed: 04/14/2017 9:25:33 AM By: Christin Fudge MD, FACS Previous Signature: 04/14/2017 9:24:47 AM Version By: Christin Fudge MD, FACS Entered By: Christin Fudge on 04/14/2017 09:25:33 Teresa Krueger ([id number]) -------------------------------------------------------------------------------- Progress Note Details Patient Name: Teresa Krueger Date of Service: 04/14/2017 8:00 AM Medical Record Number: [id number] Patient Account Number: [id number] Date of Birth/Sex: 1934/01/17 (83 y.o. Female) Treating RN: Cornell Barman Primary Care Provider: Ramonita Lab Other Clinician: Referring Provider: Ramonita Lab Treating Provider/Extender: Frann Rider in Treatment: 0 Subjective Chief Complaint Information obtained from Patient Patients presents for treatment of an open diabetic ulcer to the left lower extremity caused by trauma 2 weeks ago History of Present Illness (HPI) The following HPI elements were documented for the patient's wound: Location: left lower extremity Quality: Patient reports experiencing a sharp pain to affected area(s). Severity: Patient states wound are getting worse. Duration: Patient has had the wound for < 2 weeks prior to presenting for treatment Timing: Pain in wound is Intermittent (comes and goes Context: The wound occurred when the patient had a blunt injury to the left lower extremity which caused a large hematoma Modifying Factors: Other treatment(s) tried include:has been put on oral antibiotics by her PCP Associated Signs and Symptoms: Patient reports having increase swelling. 83 year old patient, known to be a diabetic was recently seen by her PCP regarding an injury to the left lower extremity which she has had about 2 weeks. She injured it using an electric wheelchair and hitting the ramp on her [place]i and had an x-ray in the ER which was negative for any fracture. The patient however says she has not had any x-ray done in  the ER. I did not find any x-ray reported in her electronic medical record. She has minimal drainage from the wound and it was covered with eschar. Past medical history significant for breast cancer, COPD, diabetes mellitus type 2, essential hypertension, morbid obesity, status post cardiac pacemaker, cholecystectomy, mastectomy, EGD for Barrett's esophagus. Patient was given doxycycline for 10 days and referred to the wound center. the patient has had a bilateral lower extremity venous reflux examination done in February 2018 and it was showing no venous incompetence bilaterally Wound History Patient presents with 1 open wound that has been present for approximately 2 weeks. Patient has been treating wound in the following manner: vasaline. Laboratory tests have not been performed in the last month. Patient reportedly has not tested positive for an antibiotic resistant organism. Patient reportedly has not tested positive for osteomyelitis. Patient reportedly has not had testing performed to evaluate circulation in the legs. Patient History Information obtained from Patient, Chart. Allergies aspirin, Bactrim, tolmetin, Biguanides, buprenorphine HCl, celecoxib, ciprofloxacin, codeine, propoxyphene, dextromethorphan HBr, Glucophage, hydrocodone, Lasix, Lyrica, Mobic, morphine,  NSAIDS (Non-Steroidal Anti-Inflammatory Drug), Penicillins, Phenergan, Quinolones, propoxyphene, rofecoxib, Seroquel, Sulfa (Sulfonamide Antibiotics), Panlor (hydrocodone-acetamin), sulfacetamide sodium, Darvocet-N Family History Diabetes - Paternal Grandparents, Heart Disease - [place] Grandparents, Hypertension - Mother, Teresa Krueger, Teresa Krueger ([id number]) No family history of Cancer, Kidney Disease, Lung Disease, Seizures, Stroke, Thyroid Problems, Tuberculosis. Social History Former smoker - Quit 1994, Marital Status - Married, Alcohol Use - Never, Drug Use - No History, Caffeine Use  - Never. Medical History Eyes Patient has history of Cataracts - Removed Denies history of Glaucoma, Optic Neuritis Ear/Nose/Mouth/Throat Denies history of Chronic sinus problems/congestion, Middle ear problems Hematologic/Lymphatic Denies history of Anemia, Hemophilia, Human Immunodeficiency Virus, Lymphedema, Sickle Cell Disease Respiratory Patient has history of Chronic Obstructive Pulmonary Disease (COPD), Sleep Apnea Denies history of Aspiration, Asthma, Pneumothorax, Tuberculosis Cardiovascular Patient has history of Hypertension Denies history of Angina, Arrhythmia, Congestive Heart Failure, Coronary Artery Disease, Deep Vein Thrombosis, Hypotension, Myocardial Infarction, Peripheral Arterial Disease, Peripheral Venous Disease, Phlebitis, Vasculitis Gastrointestinal Denies history of Cirrhosis , Colitis, Crohn s, Hepatitis A, Hepatitis B, Hepatitis C Endocrine Patient has history of Type II Diabetes Denies history of Type I Diabetes Genitourinary Denies history of End Stage Renal Disease Immunological Denies history of Lupus Erythematosus, Raynaud s, Scleroderma Integumentary (Skin) Denies history of History of Burn, History of pressure wounds Musculoskeletal Patient has history of Gout, Osteoarthritis Denies history of Rheumatoid Arthritis, Osteomyelitis Neurologic Patient has history of Neuropathy - Hands and feet Denies history of Dementia, Quadriplegia, Paraplegia, Seizure Disorder Oncologic Patient has history of Received Chemotherapy Denies history of Received Radiation Patient is treated with Insulin. Blood sugar is tested. Blood sugar results noted at the following times: Breakfast - 92, Bedtime - 120. Medical And Surgical History Notes Oncologic Breast Cancer 1999 Review of Systems (ROS) Constitutional Symptoms (General Health) The patient has no complaints or symptoms. Eyes Denies complaints or symptoms of Dry Eyes, Vision Changes, Glasses /  Contacts. Ear/Nose/Mouth/Throat The patient has no complaints or symptoms. Hematologic/Lymphatic The patient has no complaints or symptoms. Respiratory Complains or has symptoms of Shortness of Breath. Denies complaints or symptoms of Chronic or frequent coughs. Teresa Krueger, Teresa Krueger ([id number]) Cardiovascular Complains or has symptoms of LE edema. Denies complaints or symptoms of Chest pain. Gastrointestinal The patient has no complaints or symptoms. Endocrine Denies complaints or symptoms of Hepatitis, Thyroid disease, Polydypsia (Excessive Thirst). Genitourinary Complains or has symptoms of Kidney failure/ Dialysis - Stage III Kidney Failure, Incontinence/dribbling. Immunological The patient has no complaints or symptoms. Integumentary (Skin) Complains or has symptoms of Wounds, Bleeding or bruising tendency. Denies complaints or symptoms of Breakdown, Swelling. Musculoskeletal The patient has no complaints or symptoms. Neurologic The patient has no complaints or symptoms. Oncologic The patient has no complaints or symptoms. Psychiatric The patient has no complaints or symptoms. Objective Constitutional Pulse regular. Respirations normal and unlabored. Afebrile. Vitals Time Taken: 8:19 AM, Height: 60 in, Weight: 251 lbs, BMI: 49, Temperature: 98.2 F, Pulse: 71 bpm, Respiratory Rate: 16 breaths/min, Blood Pressure: 179/40 mmHg. Eyes Nonicteric. Reactive to light. Ears, Nose, Mouth, and Throat Lips, teeth, and gums WNL.Marland Kitchen Moist mucosa without lesions. Neck supple and nontender. No palpable supraclavicular or cervical adenopathy. Normal sized without goiter. Respiratory WNL. No retractions.. Cardiovascular Pedal Pulses WNL. ABI could not be measured. No clubbing, cyanosis or edema. Gastrointestinal (GI) Abdomen without masses or tenderness.. No liver or spleen enlargement or tenderness.. Lymphatic No adneopathy. No adenopathy. No adenopathy. Teresa Krueger, Teresa Krueger  ([id number]) Musculoskeletal Adexa without tenderness or enlargement.. Digits and nails w/o clubbing, cyanosis, infection,  petechiae, ischemia, or inflammatory conditions.Marland Kitchen Psychiatric Judgement and insight Intact.. No evidence of depression, anxiety, or agitation.. General Notes: large area of hematoma to the left anterior shin and after sharp debridement with forceps and scissors a lot of hematoma was removed but the depth is still significant and due to excessive tenderness I could not proceed with any more debridement. Integumentary (Hair, Skin) No suspicious lesions. No crepitus or fluctuance. No peri-wound warmth or erythema. No masses.. Wound #1 status is Open. Original cause of wound was Trauma. The wound is located on the Left,Medial Lower Leg. The wound measures 1.5cm length x 5.5cm width x 0.1cm depth; 6.48cm^2 area and 0.648cm^3 volume. There is no tunneling or undermining noted. There is a medium amount of sanguinous drainage noted. The wound margin is distinct with the outline attached to the wound base. There is no granulation within the wound bed. There is a large (67-100%) amount of necrotic tissue within the wound bed including Eschar. The periwound skin appearance exhibited: Erythema. The periwound skin appearance did not exhibit: Callus, Crepitus, Excoriation, Induration, Rash, Scarring, Dry/Scaly, Maceration, Atrophie Blanche, Cyanosis, Ecchymosis, Hemosiderin Staining, Mottled, Pallor, Rubor. The surrounding wound skin color is noted with erythema which is circumferential. Assessment Active Problems ICD-10 E11.622 - Type 2 diabetes mellitus with other skin ulcer L97.222 - Non-pressure chronic ulcer of left calf with fat layer exposed S81.812A - Laceration without foreign body, left lower leg, initial encounter M70.962 - Unspecified soft tissue disorder related to use, overuse and pressure, left lower leg E66.01 - Morbid (severe) obesity due to excess calories Z99.3 -  Dependence on wheelchair 83 year old, morbidly obese, diabetic with a dependence on a wheelchair, was injured with a blunt object to the left lower extremity and has a fairly large hematoma which is not infected, on the left anterior shin. after debridement and review of her wound have recommended: 1. Washing this area daily with soap and water and packing it with a silver alginate rope. 2. Elevation and exercise 3. Adequate protein, vitamin A, vitamin C and zinc 4. Good control of her diabetes mellitus -- last hemoglobin A1c was 6.7% 5. Regular physician the wound center. I anticipate at some stage she may need a wound VAC and prolonged treatment. She and her husband have read all questions answered to their satisfaction. Teresa Krueger, Teresa Krueger ([id number]) Procedures Wound #1 Pre-procedure diagnosis of Wound #1 is a Trauma, Other located on the Left,Medial Lower Leg .Severity of Tissue Pre Debridement is: Fat layer exposed. There was a Skin/Subcutaneous Tissue Debridement ([id number]) debridement with total area of 8.25 sq cm performed by Christin Fudge, MD. with the following instrument(s): Forceps and Scissors to remove Viable and Non-Viable tissue/material including Blood Clots, Exudate, Fibrin/Slough, Skin, and Subcutaneous after achieving pain control using Other (lidocaine 4%). A time out was conducted at 09:08, prior to the start of the procedure. A Moderate amount of bleeding was controlled with Pressure. The procedure was tolerated well with a pain level of 1 throughout and a pain level of 1 following the procedure. Post Debridement Measurements: 1.5cm length x 5.5cm width x 2cm depth; 12.959cm^3 volume. Character of Wound/Ulcer Post Debridement requires further debridement. Severity of Tissue Post Debridement is: Fat layer exposed. Post procedure Diagnosis Wound #1: Same as Pre-Procedure Plan Wound Cleansing: Wound #1 Left,Medial Lower Leg: Clean wound with Normal Saline. May  Shower, gently pat wound dry prior to applying new dressing. Anesthetic: Wound #1 Left,Medial Lower Leg: Topical Lidocaine 4% cream applied to wound bed prior to debridement Primary  Wound Dressing: Wound #1 Left,Medial Lower Leg: Aquacel Ag Secondary Dressing: Wound #1 Left,Medial Lower Leg: Boardered Foam Dressing Dressing Change Frequency: Wound #1 Left,Medial Lower Leg: Change dressing every other day. Follow-up Appointments: Wound #1 Left,Medial Lower Leg: Return Appointment in 1 week. Edema Control: Wound #1 Left,Medial Lower Leg: Elevate legs to the level of the heart and pump ankles as often as possible Medications-please add to medication list.: Wound #1 Left,Medial Lower Leg: P.O. Antibiotics - Continue with antibiotics. 83 year old, morbidly obese, diabetic with a dependence on a wheelchair, was injured with a blunt object to the left lower extremity and has a fairly large hematoma which is not infected, on the left anterior shin. after debridement and review of her wound have recommended: Teresa Krueger, Teresa Krueger. ([id number]) 1. Washing this area daily with soap and water and packing it with a silver alginate rope. 2. Elevation and exercise 3. Adequate protein, vitamin A, vitamin C and zinc 4. Good control of her diabetes mellitus -- last hemoglobin A1c was 6.7% 5. Regular physician the wound center. I anticipate at some stage she may need a wound VAC and prolonged treatment. She and her husband have read all questions answered to their satisfaction. Electronic Signature(s) Signed: 05/08/2017 2:34:03 PM By: Gretta Cool, BSN, RN, CWS, Kim RN, BSN Signed: 05/09/2017 10:21:42 AM By: Christin Fudge MD, FACS Previous Signature: 04/14/2017 9:34:45 AM Version By: Christin Fudge MD, FACS Previous Signature: 04/14/2017 9:31:37 AM Version By: Christin Fudge MD, FACS Entered By: Gretta Cool BSN, RN, CWS, Kim on 05/08/2017 14:34:02 Teresa Krueger  ([id number]) -------------------------------------------------------------------------------- ROS/PFSH Details Patient Name: Teresa Krueger Date of Service: 04/14/2017 8:00 AM Medical Record Number: [id number] Patient Account Number: [id number] Date of Birth/Sex: 09/13/1933 (83 y.o. Female) Treating RN: Cornell Barman Primary Care Provider: Ramonita Lab Other Clinician: Referring Provider: Ramonita Lab Treating Provider/Extender: Frann Rider in Treatment: 0 Information Obtained From Patient Chart Wound History Do you currently have one or more open woundso Yes How many open wounds do you currently haveo 1 Approximately how long have you had your woundso 2 weeks How have you been treating your wound(s) until nowo vasaline Has your wound(s) ever healed and then re-openedo No Have you had any lab work done in the past montho No Have you tested positive for an antibiotic resistant organism (MRSA, VRE)o No Have you tested positive for osteomyelitis (bone infection)o No Have you had any tests for circulation on your legso No Eyes Complaints and Symptoms: Negative for: Dry Eyes; Vision Changes; Glasses / Contacts Medical History: Positive for: Cataracts - Removed Negative for: Glaucoma; Optic Neuritis Respiratory Complaints and Symptoms: Positive for: Shortness of Breath Negative for: Chronic or frequent coughs Medical History: Positive for: Chronic Obstructive Pulmonary Disease (COPD); Sleep Apnea Negative for: Aspiration; Asthma; Pneumothorax; Tuberculosis Cardiovascular Complaints and Symptoms: Positive for: LE edema Negative for: Chest pain Medical History: Positive for: Hypertension Negative for: Angina; Arrhythmia; Congestive Heart Failure; Coronary Artery Disease; Deep Vein Thrombosis; Hypotension; Myocardial Infarction; Peripheral Arterial Disease; Peripheral Venous Disease; Phlebitis; Vasculitis Endocrine Complaints and Symptoms: Negative for: Hepatitis; Thyroid  disease; Polydypsia (Excessive Thirst) Medical History: Positive for: Type II Diabetes Teresa Krueger, Teresa Krueger. ([id number]) Negative for: Type I Diabetes Time with diabetes: 20 plus years Treated with: Insulin Blood sugar tested every day: Yes Tested : 3 Blood sugar testing results: Breakfast: 92; Bedtime: 120 Genitourinary Complaints and Symptoms: Positive for: Kidney failure/ Dialysis - Stage III Kidney Failure; Incontinence/dribbling Medical History: Negative for: End Stage Renal Disease Integumentary (Skin) Complaints and Symptoms: Positive for:  Wounds; Bleeding or bruising tendency Negative for: Breakdown; Swelling Medical History: Negative for: History of Burn; History of pressure wounds Constitutional Symptoms (General Health) Complaints and Symptoms: No Complaints or Symptoms Ear/Nose/Mouth/Throat Complaints and Symptoms: No Complaints or Symptoms Medical History: Negative for: Chronic sinus problems/congestion; Middle ear problems Hematologic/Lymphatic Complaints and Symptoms: No Complaints or Symptoms Medical History: Negative for: Anemia; Hemophilia; Human Immunodeficiency Virus; Lymphedema; Sickle Cell Disease Gastrointestinal Complaints and Symptoms: No Complaints or Symptoms Medical History: Negative for: Cirrhosis ; Colitis; Crohnos; Hepatitis A; Hepatitis B; Hepatitis C Immunological Complaints and Symptoms: No Complaints or Symptoms Medical HistoryJEARLEAN, Teresa Krueger ([id number]) Negative for: Lupus Erythematosus; Raynaudos; Scleroderma Musculoskeletal Complaints and Symptoms: No Complaints or Symptoms Medical History: Positive for: Gout; Osteoarthritis Negative for: Rheumatoid Arthritis; Osteomyelitis Neurologic Complaints and Symptoms: No Complaints or Symptoms Medical History: Positive for: Neuropathy - Hands and feet Negative for: Dementia; Quadriplegia; Paraplegia; Seizure Disorder Oncologic Complaints and Symptoms: No Complaints or  Symptoms Medical History: Positive for: Received Chemotherapy Negative for: Received Radiation Past Medical History Notes: Breast Cancer 1999 Psychiatric Complaints and Symptoms: No Complaints or Symptoms HBO Extended History Items Eyes: Cataracts Immunizations Pneumococcal Vaccine: Received Pneumococcal Vaccination: Yes Implantable Devices Family and Social History Cancer: No; Diabetes: Yes - Paternal Grandparents; Heart Disease: Yes - Mother,Father,Maternal Grandparents,Paternal Grandparents; Hypertension: Yes - Mother; Kidney Disease: No; Lung Disease: No; Seizures: No; Stroke: No; Thyroid Problems: No; Tuberculosis: No; Former smoker - Quit 1994; Marital Status - Married; Alcohol Use: Never; Drug Use: No History; Caffeine Use: Never; Advanced Directives: Yes (Not Provided); Patient does not want information on Advanced Directives; Living Will: Yes (Not Provided); Medical Power of Attorney: Yes (Not Provided) Physician Affirmation I have reviewed and agree with the above information. Electronic Signature(s) Signed: 04/14/2017 12:49:35 PM By: Christin Fudge MD, FACS Teresa Krueger ([id number]) Signed: 04/14/2017 4:56:47 PM By: Gretta Cool BSN, RN, CWS, Kim RN, BSN Entered By: Christin Fudge on 04/14/2017 09:00:01 Teresa Krueger ([id number]) -------------------------------------------------------------------------------- [place] Details Patient Name: Teresa Krueger Date of Service: 04/14/2017 Medical Record Number: [id number] Patient Account Number: [id number] Date of Birth/Sex: 24-May-1934 (83 y.o. Female) Treating RN: Cornell Barman Primary Care Provider: Ramonita Lab Other Clinician: Referring Provider: Ramonita Lab Treating Provider/Extender: Frann Rider in Treatment: 0 Diagnosis Coding ICD-10 Codes Code Description E11.622 Type 2 diabetes mellitus with other skin ulcer L97.222 Non-pressure chronic ulcer of left calf with fat layer exposed S81.812A Laceration  without foreign body, left lower leg, initial encounter M70.962 Unspecified soft tissue disorder related to use, overuse and pressure, left lower leg E66.01 Morbid (severe) obesity due to excess calories Z99.3 Dependence on wheelchair Facility Procedures CPT4 Code Description: 09381829 99213 - WOUND CARE VISIT-LEV 3 EST PT Modifier: Quantity: 1 CPT4 Code Description: 93716967 11042 - DEB SUBQ TISSUE 20 SQ CM/< ICD-10 Diagnosis Description E11.622 Type 2 diabetes mellitus with other skin ulcer L97.222 Non-pressure chronic ulcer of left calf with fat layer exposed S81.812A Laceration without  foreign body, left lower leg, initial encoun M70.962 Unspecified soft tissue disorder related to use, overuse and pr Modifier: ter essure, left lower Quantity: 1 leg Physician Procedures CPT4 Code Description: 8938101 75102 - WC PHYS LEVEL 4 - NEW PT ICD-10 Diagnosis Description E11.622 Type 2 diabetes mellitus with other skin ulcer L97.222 Non-pressure chronic ulcer of left calf with fat layer exposed S81.812A Laceration without foreign  body, left lower leg, initial encoun M70.962 Unspecified soft tissue disorder related to use, overuse and pr Modifier: 25 ter essure, left lower Quantity: 1 leg CPT4 Code  Description: 9093112 11042 - WC PHYS SUBQ TISS 20 SQ CM ICD-10 Diagnosis Description E11.622 Type 2 diabetes mellitus with other skin ulcer L97.222 Non-pressure chronic ulcer of left calf with fat layer exposed S81.812A Laceration without  foreign body, left lower leg, initial encoun M70.962 Unspecified soft tissue disorder related to use, overuse and pr Modifier: ter essure, left lower Quantity: 1 leg Electronic Signature(s) Signed: 04/14/2017 9:43:59 AM By: Gretta Cool, BSN, RN, CWS, Kim RN, BSN Teresa Krueger ([id number]) Signed: 04/14/2017 12:49:35 PM By: Christin Fudge MD, FACS Previous Signature: 04/14/2017 9:34:54 AM Version By: Christin Fudge MD, FACS Previous Signature: 04/14/2017 9:32:02 AM  Version By: Christin Fudge MD, FACS Entered By: Gretta Cool, BSN, RN, CWS, Kim on 04/14/2017 09:43:59

## 2017-04-15 ENCOUNTER — Ambulatory Visit
Admission: RE | Admit: 2017-04-15 | Discharge: 2017-04-15 | Disposition: A | Discharge status: Home / Self Care | Payer: Medicare Other | Source: Ambulatory Visit | Attending: Internal Medicine | Admitting: Internal Medicine

## 2017-04-15 ENCOUNTER — Ambulatory Visit (INDEPENDENT_AMBULATORY_CARE_PROVIDER_SITE_OTHER): Payer: Medicare Other | Admitting: Podiatry

## 2017-04-15 ENCOUNTER — Encounter: Payer: Self-pay | Admitting: Podiatry

## 2017-04-15 DIAGNOSIS — B351 Tinea unguium: Secondary | ICD-10-CM

## 2017-04-15 DIAGNOSIS — M79676 Pain in unspecified toe(s): Secondary | ICD-10-CM

## 2017-04-15 DIAGNOSIS — I6523 Occlusion and stenosis of bilateral carotid arteries: Secondary | ICD-10-CM | POA: Insufficient documentation

## 2017-04-15 DIAGNOSIS — E0842 Diabetes mellitus due to underlying condition with diabetic polyneuropathy: Secondary | ICD-10-CM

## 2017-04-15 LAB — POCT I-STAT CREATININE: Creatinine, Ser: 1.2 mg/dL — ABNORMAL HIGH (ref 0.44–1.00)

## 2017-04-15 MED ORDER — IOPAMIDOL (ISOVUE-370) INJECTION 76%
60.0000 mL | Freq: Once | INTRAVENOUS | Status: AC | PRN
  Administered 2017-04-15: 60 mL via INTRAVENOUS

## 2017-04-15 NOTE — Progress Notes (Signed)
BRUCHA, AHLQUIST ([id number]) Visit Report for 04/14/2017 Abuse/Suicide Risk Screen Details Patient Name: Teresa, Krueger Date of Service: 04/14/2017 8:00 AM Medical Record Number: [id number] Patient Account Number: [id number] Date of Birth/Sex: Oct 15, 1933 (83 y.o. Female) Treating RN: Cornell Barman Primary Care Malashia Kamaka: Ramonita Lab Other Clinician: Referring Kermit Arnette: Ramonita Lab Treating Devyon Keator/Extender: Frann Rider in Treatment: 0 Abuse/Suicide Risk Screen Items Answer ABUSE/SUICIDE RISK SCREEN: Has anyone close to you tried to hurt or harm you recentlyo No Do you feel uncomfortable with anyone in your familyo No Has anyone forced you do things that you didnot want to doo No Do you have any thoughts of harming yourselfo No Patient displays signs or symptoms of abuse and/or neglect. No Electronic Signature(s) Signed: 04/14/2017 4:56:47 PM By: Gretta Cool, BSN, RN, CWS, Kim RN, BSN Entered By: Gretta Cool, BSN, RN, CWS, Kim on 04/14/2017 08:39:21 Teresa Krueger ([id number]) -------------------------------------------------------------------------------- Activities of Daily Living Details Patient Name: Teresa Krueger Date of Service: 04/14/2017 8:00 AM Medical Record Number: [id number] Patient Account Number: [id number] Date of Birth/Sex: [birth year] (83 y.o. Female) Treating RN: Cornell Barman Primary Care Zaylee Cornia: Ramonita Lab Other Clinician: Referring Ayeza Therriault: Ramonita Lab Treating Jeanelle Dake/Extender: Frann Rider in Treatment: 0 Activities of Daily Living Items Answer Activities of Daily Living (Please select one for each item) Drive Automobile Not Able Take Medications Need Assistance Use Telephone Need Assistance Care for Appearance Need Assistance Use Toilet Need Assistance Bath / Shower Need Assistance Dress Self Need Assistance Feed Self Need Assistance Walk Need Assistance Get In / Out Bed Need Assistance Housework Need Assistance Prepare Meals Need  Assistance Handle Money Need Assistance Shop for Self Need Assistance Electronic Signature(s) Signed: 04/14/2017 4:56:47 PM By: Gretta Cool, BSN, RN, CWS, Kim RN, BSN Entered By: Gretta Cool, BSN, RN, CWS, Kim on 04/14/2017 08:39:44 Teresa Krueger ([id number]) -------------------------------------------------------------------------------- Education Assessment Details Patient Name: Teresa Krueger Date of Service: 04/14/2017 8:00 AM Medical Record Number: [id number] Patient Account Number: [id number] Date of Birth/Sex: July 11, 1933 (83 y.o. Female) Treating RN: Cornell Barman Primary Care Marvel Sapp: Ramonita Lab Other Clinician: Referring Raylan Hanton: Ramonita Lab Treating Brighten Buzzelli/Extender: Frann Rider in Treatment: 0 Primary Learner Assessed: Patient Learning Preferences/Education Level/Primary Language Learning Preference: Explanation, Demonstration Highest Education Level: College or Above Preferred Language: English Cognitive Barrier Assessment/Beliefs Language Barrier: No Translator Needed: No Memory Deficit: No Emotional Barrier: No Cultural/Religious Beliefs Affecting Medical Care: No Physical Barrier Assessment Impaired Vision: No Impaired Hearing: No Decreased Hand dexterity: No Knowledge/Comprehension Assessment Knowledge Level: High Comprehension Level: High Ability to understand written High instructions: Ability to understand verbal High instructions: Motivation Assessment Anxiety Level: Calm Cooperation: Cooperative Education Importance: Acknowledges Need Interest in Health Problems: Asks Questions Perception: Coherent Willingness to Engage in Self- High Management Activities: Readiness to Engage in Self- High Management Activities: Electronic Signature(s) Signed: 04/14/2017 4:56:47 PM By: Gretta Cool, BSN, RN, CWS, Kim RN, BSN Entered By: Gretta Cool, BSN, RN, CWS, Kim on 04/14/2017 08:40:06 Teresa Krueger  ([id number]) -------------------------------------------------------------------------------- Fall Risk Assessment Details Patient Name: Teresa Krueger Date of Service: 04/14/2017 8:00 AM Medical Record Number: [id number] Patient Account Number: [id number] Date of Birth/Sex: July 04, 1933 (83 y.o. Female) Treating RN: Cornell Barman Primary Care Maila Dukes: Ramonita Lab Other Clinician: Referring Ndea Kilroy: Ramonita Lab Treating Dellia Donnelly/Extender: Frann Rider in Treatment: 0 Fall Risk Assessment Items Have you had 2 or more falls in the last 12 monthso 0 No Have you had any fall that resulted in injury in the last 12 monthso 0 No FALL RISK ASSESSMENT: History of falling - immediate  or within 3 months 0 No Secondary diagnosis 0 No Ambulatory aid None/bed rest/wheelchair/nurse 0 Yes Crutches/cane/walker 0 No Furniture 0 No IV Access/Saline Lock 0 No Gait/Training Normal/bed rest/immobile 0 Yes Weak 0 No Impaired 0 No Mental Status Oriented to own ability 0 Yes Electronic Signature(s) Signed: 04/14/2017 4:56:47 PM By: Gretta Cool, BSN, RN, CWS, Kim RN, BSN Entered By: Gretta Cool, BSN, RN, CWS, Kim on 04/14/2017 08:40:29 Teresa Krueger ([id number]) -------------------------------------------------------------------------------- Foot Assessment Details Patient Name: Teresa Krueger Date of Service: 04/14/2017 8:00 AM Medical Record Number: [id number] Patient Account Number: [id number] Date of Birth/Sex: February 27, 1934 (83 y.o. Female) Treating RN: Cornell Barman Primary Care Khye Hochstetler: Ramonita Lab Other Clinician: Referring Elnore Cosens: Ramonita Lab Treating Dhaval Woo/Extender: Frann Rider in Treatment: 0 Foot Assessment Items Site Locations + = Sensation present, - = Sensation absent, C = Callus, U = Ulcer R = Redness, W = Warmth, M = Maceration, PU = Pre-ulcerative lesion F = Fissure, S = Swelling, D = Dryness Assessment Right: Left: Other Deformity: No No Prior Foot Ulcer: No  No Prior Amputation: No No Charcot Joint: No No Ambulatory Status: Non-ambulatory Assistance Device: Wheelchair GaitEnergy manager) Signed: 04/14/2017 4:56:47 PM By: Gretta Cool, BSN, RN, CWS, Kim RN, BSN Entered By: Gretta Cool, BSN, RN, CWS, Kim on 04/14/2017 08:42:34 Teresa Krueger ([id number]) -------------------------------------------------------------------------------- Nutrition Risk Assessment Details Patient Name: Teresa Krueger Date of Service: 04/14/2017 8:00 AM Medical Record Number: [id number] Patient Account Number: [id number] Date of Birth/Sex: [birth year] (83 y.o. Female) Treating RN: Cornell Barman Primary Care Katelynd Blauvelt: Ramonita Lab Other Clinician: Referring Darlynn Ricco: Ramonita Lab Treating Sherria Riemann/Extender: Frann Rider in Treatment: 0 Height (in): 60 Weight (lbs): 251 Body Mass Index (BMI): 49 Nutrition Risk Assessment Items NUTRITION RISK SCREEN: I have an illness or condition that made me change the kind and/or amount of 0 No food I eat I eat fewer than two meals per day 0 No I eat few fruits and vegetables, or milk products 0 No I have three or more drinks of beer, liquor or wine almost every day 0 No I have tooth or mouth problems that make it hard for me to eat 0 No I don't always have enough money to buy the food I need 0 No I eat alone most of the time 0 No I take three or more different prescribed or over-the-counter drugs a day 1 Yes Without wanting to, I have lost or gained 10 pounds in the last six months 0 No I am not always physically able to shop, cook and/or feed myself 0 No Nutrition Protocols Good Risk Protocol 0 No interventions needed Moderate Risk Protocol Electronic Signature(s) Signed: 04/14/2017 4:56:47 PM By: Gretta Cool, BSN, RN, CWS, Kim RN, BSN Entered By: Gretta Cool, BSN, RN, CWS, Kim on 04/14/2017 08:40:48

## 2017-04-15 NOTE — Progress Notes (Signed)
Teresa Krueger, Teresa Krueger ([id number]) Visit Report for 04/14/2017 Allergy List Details Patient Name: Teresa Krueger, Teresa Krueger Date of Service: 04/14/2017 8:00 AM Medical Record Number: [id number] Patient Account Number: [id number] Date of Birth/Sex: [birth year] (83 y.o. Female) Treating RN: Cornell Barman Primary Care Tnia Anglada: Ramonita Lab Other Clinician: Referring Melicia Esqueda: Ramonita Lab Treating Tamyia Minich/Extender: Frann Rider in Treatment: 0 Allergies Active Allergies aspirin Bactrim tolmetin Biguanides buprenorphine HCl celecoxib ciprofloxacin codeine propoxyphene dextromethorphan HBr Glucophage hydrocodone Lasix Lyrica Mobic morphine NSAIDS (Non-Steroidal Anti-Inflammatory Drug) Penicillins Phenergan Teresa Krueger, Teresa Krueger. ([id number]) Quinolones propoxyphene rofecoxib Seroquel Sulfa (Sulfonamide Antibiotics) Panlor (hydrocodone-acetamin) sulfacetamide sodium Darvocet-N Allergy Notes Electronic Signature(s) Signed: 04/14/2017 8:49:06 AM By: Lorine Bears RCP, RRT, CHT Previous Signature: 04/14/2017 8:48:35 AM Version By: Gretta Cool BSN, RN, CWS, Kim RN, BSN Previous Signature: 04/14/2017 8:47:05 AM Version By: Lorine Bears RCP, RRT, CHT Entered By: Lorine Bears on 04/14/2017 08:49:06 Teresa Krueger ([id number]) -------------------------------------------------------------------------------- Arrival Information Details Patient Name: Teresa Krueger Date of Service: 04/14/2017 8:00 AM Medical Record Number: [id number] Patient Account Number: [id number] Date of Birth/Sex: [birth year] (83 y.o. Female) Treating RN: Cornell Barman Primary Care Raheen Capili: Ramonita Lab Other Clinician: Referring Kannon Granderson: Ramonita Lab Treating Jeray Shugart/Extender: Frann Rider in Treatment: 0 Visit Information Patient Arrived: Wheel Chair Arrival Time: 08:17 Accompanied By: husband Transfer Assistance: None Patient Identification Verified:  Yes Secondary Verification Process Yes Completed: Patient Requires Transmission-Based No Precautions: Patient Has Alerts: Yes Patient Alerts: Patient on Blood Thinner Type II Diabetic Plavix Electronic Signature(s) Signed: 04/14/2017 4:56:47 PM By: Gretta Cool, BSN, RN, CWS, Kim RN, BSN Entered By: Gretta Cool, BSN, RN, CWS, Kim on 04/14/2017 08:18:09 Teresa Krueger ([id number]) -------------------------------------------------------------------------------- Clinic Level of Care Assessment Details Patient Name: Teresa Krueger Date of Service: 04/14/2017 8:00 AM Medical Record Number: [id number] Patient Account Number: [id number] Date of Birth/Sex: 12-19-1933 (83 y.o. Female) Treating RN: Cornell Barman Primary Care Jourdan Durbin: Ramonita Lab Other Clinician: Referring Taraneh Metheney: Ramonita Lab Treating Audry Pecina/Extender: Frann Rider in Treatment: 0 Clinic Level of Care Assessment Items TOOL 1 Quantity Score []  - Use when EandM and Procedure is performed on INITIAL visit 0 ASSESSMENTS - Nursing Assessment / Reassessment X - General Physical Exam (combine w/ comprehensive assessment (listed just below) when 1 20 performed on new pt. evals) X- 1 25 Comprehensive Assessment (HX, ROS, Risk Assessments, Wounds Hx, etc.) ASSESSMENTS - Wound and Skin Assessment / Reassessment []  - Dermatologic / Skin Assessment (not related to wound area) 0 ASSESSMENTS - Ostomy and/or Continence Assessment and Care []  - Incontinence Assessment and Management 0 []  - 0 Ostomy Care Assessment and Management (repouching, etc.) PROCESS - Coordination of Care X - Simple Patient / Family Education for ongoing care 1 15 []  - 0 Complex (extensive) Patient / Family Education for ongoing care X- 1 10 Staff obtains Programmer, systems, Records, Test Results / Process Orders []  - 0 Staff telephones HHA, Nursing Homes / Clarify orders / etc []  - 0 Routine Transfer to another Facility (non-emergent condition) []  - 0 Routine  Hospital Admission (non-emergent condition) X- 1 15 New Admissions / Biomedical engineer / Ordering NPWT, Apligraf, etc. []  - 0 Emergency Hospital Admission (emergent condition) PROCESS - Special Needs []  - Pediatric / Minor Patient Management 0 []  - 0 Isolation Patient Management []  - 0 Hearing / Language / Visual special needs []  - 0 Assessment of Community assistance (transportation, D/C planning, etc.) []  - 0 Additional assistance / Altered mentation []  - 0 Support Surface(s) Assessment (bed, cushion, seat, etc.) Tarte, Athina G. ([id number]) INTERVENTIONS -  Miscellaneous []  - External ear exam 0 []  - 0 Patient Transfer (multiple staff / Civil Service fast streamer / Similar devices) []  - 0 Simple Staple / Suture removal (25 or less) []  - 0 Complex Staple / Suture removal (26 or more) []  - 0 Hypo/Hyperglycemic Management (do not check if billed separately) []  - 0 Ankle / Brachial Index (ABI) - do not check if billed separately Has the patient been seen at the hospital within the last three years: Yes Total Score: 85 Level Of Care: New/Established - Level 3 Electronic Signature(s) Signed: 04/14/2017 4:56:47 PM By: Gretta Cool, BSN, RN, CWS, Kim RN, BSN Entered By: Gretta Cool, BSN, RN, CWS, Kim on 04/14/2017 09:26:52 Teresa Krueger ([id number]) -------------------------------------------------------------------------------- Encounter Discharge Information Details Patient Name: Teresa Krueger Date of Service: 04/14/2017 8:00 AM Medical Record Number: [id number] Patient Account Number: [id number] Date of Birth/Sex: [birth year] (83 y.o. Female) Treating RN: Cornell Barman Primary Care Mayu Ronk: Ramonita Lab Other Clinician: Referring Joyanna Kleman: Ramonita Lab Treating Trevon Strothers/Extender: Frann Rider in Treatment: 0 Encounter Discharge Information Items Discharge Pain Level: 1 Discharge Condition: Stable Ambulatory Status: Wheelchair Discharge Destination: Home Transportation: Private  Auto Accompanied By: husband Schedule Follow-up Appointment: Yes Medication Reconciliation completed and Yes provided to Patient/Care Sunil Hue: Provided on Clinical Summary of Care: 04/14/2017 Form Type Recipient Paper Patient JF Electronic Signature(s) Signed: 04/14/2017 4:56:47 PM By: Gretta Cool, BSN, RN, CWS, Kim RN, BSN Entered By: Gretta Cool, BSN, RN, CWS, Kim on 04/14/2017 09:28:13 Teresa Krueger ([id number]) -------------------------------------------------------------------------------- Lower Extremity Assessment Details Patient Name: Teresa Krueger Date of Service: 04/14/2017 8:00 AM Medical Record Number: [id number] Patient Account Number: [id number] Date of Birth/Sex: [birth year] (83 y.o. Female) Treating RN: Cornell Barman Primary Care Jamille Yoshino: Ramonita Lab Other Clinician: Referring Jilliane Kazanjian: Ramonita Lab Treating Tammy Ericsson/Extender: Frann Rider in Treatment: 0 Edema Assessment Assessed: [Left: No] [Right: No] [Left: Edema] [Right: :] Calf Left: Right: Point of Measurement: 30 cm From Medial Instep 42 cm cm Ankle Left: Right: Point of Measurement: 10 cm From Medial Instep 32 cm cm Vascular Assessment Pulses: Dorsalis Pedis Palpable: [Left:Yes] Doppler Audible: [Left:Yes] Posterior Tibial Palpable: [Left:Yes] Doppler Audible: [Left:Yes] Extremity colors, hair growth, and conditions: Extremity Color: [Left:Hyperpigmented] Hair Growth on Extremity: [Left:No] Temperature of Extremity: [Left:Warm] Capillary Refill: [Left:< 3 seconds] Dependent Rubor: [Left:No] Blanched when Elevated: [Left:No] Lipodermatosclerosis: [Left:No] Toe Nail Assessment Left: Right: Thick: No Discolored: No Deformed: No Improper Length and Hygiene: No Notes Unabe to get ABI due to leg size, pain and location of wound. Electronic Signature(s) Signed: 04/14/2017 4:56:47 PM By: Gretta Cool, BSN, RN, CWS, Kim RN, BSN Entered By: Gretta Cool, BSN, RN, CWS, Kim on 04/14/2017 08:29:13 Teresa Krueger ([id number] Krueger ([id number]) -------------------------------------------------------------------------------- Multi Wound Chart Details Patient Name: Teresa Krueger Date of Service: 04/14/2017 8:00 AM Medical Record Number: [id number] Patient Account Number: [id number] Date of Birth/Sex: October 02, 1933 (83 y.o. Female) Treating RN: Cornell Barman Primary Care Ameet Sandy: Ramonita Lab Other Clinician: Referring Warnell Rasnic: Ramonita Lab Treating Less Woolsey/Extender: Frann Rider in Treatment: 0 Vital Signs Height(in): 60 Pulse(bpm): 71 Weight(lbs): 251 Blood Pressure(mmHg): 179/40 Body Mass Index(BMI): 49 Temperature(F): 98.2 Respiratory Rate 16 (breaths/min): Photos: [N/A:N/A] Wound Location: Left Lower Leg - Medial N/A N/A Wounding Event: Trauma N/A N/A Primary Etiology: Trauma, Other N/A N/A Date Acquired: 04/03/2017 N/A N/A Weeks of Treatment: 0 N/A N/A Wound Status: Open N/A N/A Measurements L x W x D 1.5x5.5x0.1 N/A N/A (cm) Area (cm) : 6.48 N/A N/A Volume (cm) : 0.648 N/A N/A Classification: Unclassifiable N/A N/A Exudate Amount: Medium N/A  N/A Exudate Type: Sanguinous N/A N/A Exudate Color: red N/A N/A Wound Margin: Distinct, outline attached N/A N/A Granulation Amount: None Present (0%) N/A N/A Necrotic Amount: Large (67-100%) N/A N/A Necrotic Tissue: Eschar N/A N/A Exposed Structures: Fascia: No N/A N/A Fat Layer (Subcutaneous Tissue) Exposed: No Tendon: No Muscle: No Joint: No Bone: No Epithelialization: None N/A N/A Debridement: Debridement ([id number]) N/A N/A Pre-procedure 09:08 N/A N/A Verification/Time Out Taken: Pain Control: Other N/A N/A Tissue Debrided: Blood Clots, Exudates, Skin N/A N/A Teresa Krueger, Teresa Krueger. ([id number]) Level: Skin/Subcutaneous Tissue N/A N/A Debridement Area (sq cm): 8.25 N/A N/A Instrument: Forceps, Scissors N/A N/A Bleeding: Moderate N/A N/A Hemostasis Achieved: Pressure N/A N/A Procedural Pain:  1 N/A N/A Post Procedural Pain: 1 N/A N/A Debridement Treatment Procedure was tolerated well N/A N/A Response: Post Debridement 1.5x5.5x2 N/A N/A Measurements L x W x D (cm) Post Debridement Volume: 12.959 N/A N/A (cm) Periwound Skin Texture: Excoriation: No N/A N/A Induration: No Callus: No Crepitus: No Rash: No Scarring: No Periwound Skin Moisture: Maceration: No N/A N/A Dry/Scaly: No Periwound Skin Color: Erythema: Yes N/A N/A Atrophie Blanche: No Cyanosis: No Ecchymosis: No Hemosiderin Staining: No Mottled: No Pallor: No Rubor: No Erythema Location: Circumferential N/A N/A Tenderness on Palpation: No N/A N/A Wound Preparation: Ulcer Cleansing: N/A N/A Rinsed/Irrigated with Saline Topical Anesthetic Applied: Other: lidocaine 4% Procedures Performed: Debridement N/A N/A Treatment Notes Electronic Signature(s) Signed: 04/14/2017 9:25:40 AM By: Christin Fudge MD, FACS Entered By: Christin Fudge on 04/14/2017 09:25:40 Teresa Krueger ([id number]) -------------------------------------------------------------------------------- Divide Details Patient Name: Teresa Krueger Date of Service: 04/14/2017 8:00 AM Medical Record Number: [id number] Patient Account Number: [id number] Date of Birth/Sex: 04/16/1934 (83 y.o. Female) Treating RN: Cornell Barman Primary Care Dempsy Damiano: Ramonita Lab Other Clinician: Referring Taahir Grisby: Ramonita Lab Treating Sophee Mckimmy/Extender: Frann Rider in Treatment: 0 Active Inactive ` Necrotic Tissue Nursing Diagnoses: Impaired tissue integrity related to necrotic/devitalized tissue Knowledge deficit related to management of necrotic/devitalized tissue Goals: Necrotic/devitalized tissue will be minimized in the wound bed Date Initiated: 04/14/2017 Target Resolution Date: 04/28/2017 Goal Status: Active Interventions: Assess patient pain level pre-, during and post procedure and prior to discharge Provide  education on necrotic tissue and debridement process Treatment Activities: Apply topical anesthetic as ordered : 04/14/2017 Notes: ` Orientation to the Wound Care Program Nursing Diagnoses: Knowledge deficit related to the wound healing center program Goals: Patient/caregiver will verbalize understanding of the [place] Date Initiated: 04/14/2017 Target Resolution Date: 04/21/2017 Goal Status: Active Interventions: Provide education on orientation to the wound center Notes: ` Wound/Skin Impairment Nursing Diagnoses: Impaired tissue integrity GoalsJOEI, Teresa Krueger ([id number]) Ulcer/skin breakdown will heal within 14 weeks Date Initiated: 04/14/2017 Target Resolution Date: 08/04/2017 Goal Status: Active Interventions: Assess ulceration(s) every visit Treatment Activities: Referred to DME Ellarie Picking for dressing supplies : 04/14/2017 Notes: Electronic Signature(s) Signed: 04/14/2017 4:56:47 PM By: Gretta Cool, BSN, RN, CWS, Kim RN, BSN Entered By: Gretta Cool, BSN, RN, CWS, Kim on 04/14/2017 08:43:48 Teresa Krueger ([id number]) -------------------------------------------------------------------------------- Pain Assessment Details Patient Name: Teresa Krueger Date of Service: 04/14/2017 8:00 AM Medical Record Number: [id number] Patient Account Number: [id number] Date of Birth/Sex: 1934-06-04 (83 y.o. Female) Treating RN: Cornell Barman Primary Care Linnaea Ahn: Ramonita Lab Other Clinician: Referring Netra Postlethwait: Ramonita Lab Treating Kostas Marrow/Extender: Frann Rider in Treatment: 0 Active Problems Location of Pain Severity and Description of Pain Patient Has Paino No Site Locations With Dressing Change: No Duration of the Pain. Constant / Intermittento Constant Rate the pain. Current Pain Level: 0  Character of Pain Describe the Pain: Burning, Shooting, Tender Pain Management and Medication Current Pain Management: Medication: Yes Electronic  Signature(s) Signed: 04/14/2017 4:56:47 PM By: Gretta Cool, BSN, RN, CWS, Kim RN, BSN Entered By: Gretta Cool, BSN, RN, CWS, Kim on 04/14/2017 08:19:02 Teresa Krueger ([id number]) -------------------------------------------------------------------------------- Patient/Caregiver Education Details Patient Name: Teresa Krueger Date of Service: 04/14/2017 8:00 AM Medical Record Number: [id number] Patient Account Number: [id number] Date of Birth/Gender: [birth year] (83 y.o. Female) Treating RN: Cornell Barman Primary Care Physician: Ramonita Lab Other Clinician: Referring Physician: Ramonita Lab Treating Physician/Extender: Frann Rider in Treatment: 0 Education Assessment Education Provided To: Patient Education Topics Provided Welcome To [facility]: Handouts: Welcome To [facility] Methods: Demonstration, Explain/Verbal Responses: State content correctly Wound Debridement: Handouts: Wound Debridement Methods: Demonstration, Explain/Verbal Responses: State content correctly Electronic Signature(s) Signed: 04/14/2017 4:56:47 PM By: Gretta Cool, BSN, RN, CWS, Kim RN, BSN Entered By: Gretta Cool, BSN, RN, CWS, Kim on 04/14/2017 62:95:28 Teresa Krueger ([id number]) -------------------------------------------------------------------------------- Wound Assessment Details Patient Name: Teresa Krueger Date of Service: 04/14/2017 8:00 AM Medical Record Number: [id number] Patient Account Number: [id number] Date of Birth/Sex: 03-23-1934 (83 y.o. Female) Treating RN: Cornell Barman Primary Care Druscilla Petsch: Ramonita Lab Other Clinician: Referring Mysha Peeler: Ramonita Lab Treating Simrat Kendrick/Extender: Frann Rider in Treatment: 0 Wound Status Wound Number: 1 Primary Etiology: Trauma, Other Wound Location: Left Lower Leg - Medial Wound Status: Open Wounding Event: Trauma Date Acquired: 04/03/2017 Weeks Of Treatment: 0 Clustered Wound: No Photos Wound Measurements Length: (cm)  1.5 Width: (cm) 5.5 Depth: (cm) 0.1 Area: (cm) 6.48 Volume: (cm) 0.648 % Reduction in Area: % Reduction in Volume: Epithelialization: None Tunneling: No Undermining: No Wound Description Classification: Unclassifiable Wound Margin: Distinct, outline attached Exudate Amount: Medium Exudate Type: Sanguinous Exudate Color: red Foul Odor After Cleansing: No Slough/Fibrino No Wound Bed Granulation Amount: None Present (0%) Exposed Structure Necrotic Amount: Large (67-100%) Fascia Exposed: No Necrotic Quality: Eschar Fat Layer (Subcutaneous Tissue) Exposed: No Tendon Exposed: No Muscle Exposed: No Joint Exposed: No Bone Exposed: No Periwound Skin Texture Texture Color No Abnormalities Noted: No No Abnormalities Noted: No Callus: No Atrophie Blanche: No Crepitus: No Cyanosis: No Teresa Krueger, Teresa Krueger ([id number]) Excoriation: No Ecchymosis: No Induration: No Erythema: Yes Rash: No Erythema Location: Circumferential Scarring: No Hemosiderin Staining: No Mottled: No Moisture Pallor: No No Abnormalities Noted: No Rubor: No Dry / Scaly: No Maceration: No Wound Preparation Ulcer Cleansing: Rinsed/Irrigated with Saline Topical Anesthetic Applied: Other: lidocaine 4%, Treatment Notes Wound #1 (Left, Medial Lower Leg) 1. Cleansed with: Clean wound with Normal Saline May Shower, gently pat wound dry prior to applying new dressing. 2. Anesthetic Topical Lidocaine 4% cream to wound bed prior to debridement 4. Dressing Applied: Aquacel Ag 5. Secondary Dressing Applied Bordered Foam Dressing Electronic Signature(s) Signed: 04/14/2017 4:56:47 PM By: Gretta Cool, BSN, RN, CWS, Kim RN, BSN Entered By: Gretta Cool, BSN, RN, CWS, Kim on 04/14/2017 08:25:30 Teresa Krueger ([id number]) -------------------------------------------------------------------------------- [place] Details Patient Name: Teresa Krueger Date of Service: 04/14/2017 8:00 AM Medical Record Number:  [id number] Patient Account Number: [id number] Date of Birth/Sex: Jun 15, 1934 (83 y.o. Female) Treating RN: Cornell Barman Primary Care Akshath Mccarey: Ramonita Lab Other Clinician: Referring Ell Tiso: Ramonita Lab Treating Mont Jagoda/Extender: Frann Rider in Treatment: 0 Vital Signs Time Taken: 08:19 Temperature (F): 98.2 Height (in): 60 Pulse (bpm): 71 Weight (lbs): 251 Respiratory Rate (breaths/min): 16 Body Mass Index (BMI): 49 Blood Pressure (mmHg): 179/40 Reference Range: 80 - 120 mg / dl Electronic Signature(s) Signed: 04/14/2017 4:56:47 PM By:  Gretta Cool, BSN, RN, CWS, Leisure centre manager, BSN Entered By: Gretta Cool, BSN, RN, CWS, Kim on 04/14/2017 08:19:33

## 2017-04-17 NOTE — Progress Notes (Signed)
   SUBJECTIVE Patient with a history of diabetes mellitus presents to office today complaining of elongated, thickened nails. Pain while ambulating in shoes. Patient is unable to trim their own nails. She notes a wound to the LLE that is being followed by the wound care center.   Past Medical History:  Diagnosis Date  . Arthritis   . Asthma   . Barrett's esophagus   . Breast cancer ([place]) 1999   LT MASTECTOMY  . Bronchiectasis ([place])   . COPD (chronic obstructive pulmonary disease) ([place])   . Diabetes mellitus ([place])   . Hypercholesterolemia   . Hypertension   . Lymphedema   . Mini stroke ([place])   . Osteoporosis   . Pacemaker     OBJECTIVE General Patient is awake, alert, and oriented x 3 and in no acute distress. Derm Skin is dry and supple bilateral. Negative open lesions or macerations. Remaining integument unremarkable. Nails are tender, long, thickened and dystrophic with subungual debris, consistent with onychomycosis, 1-5 bilateral. No signs of infection noted. Vasc  DP and PT pedal pulses palpable bilaterally. Temperature gradient within normal limits.  Neuro Epicritic and protective threshold sensation diminished bilaterally.  Musculoskeletal Exam No symptomatic pedal deformities noted bilateral. Muscular strength within normal limits.  ASSESSMENT 1. Diabetes Mellitus w/ peripheral neuropathy 2. Onychomycosis of nail due to dermatophyte bilateral 3. Pain in foot bilateral  PLAN OF CARE 1. Patient evaluated today. 2. Instructed to maintain good pedal hygiene and foot care. Stressed importance of controlling blood sugar.  3. Mechanical debridement of nails 1-5 bilaterally performed using a nail nipper. Filed with dremel without incident.  4. Return to clinic in 3 mos.     Edrick Kins, DPM [facility]  Dr. Edrick Kins, [address]                                        [place], [place] [address]                Office [phone]    Fax [phone]

## 2017-04-21 ENCOUNTER — Encounter: Payer: Medicare Other | Admitting: Surgery

## 2017-04-21 DIAGNOSIS — E11622 Type 2 diabetes mellitus with other skin ulcer: Secondary | ICD-10-CM | POA: Diagnosis not present

## 2017-04-22 ENCOUNTER — Telehealth (INDEPENDENT_AMBULATORY_CARE_PROVIDER_SITE_OTHER): Payer: Self-pay | Admitting: Vascular Surgery

## 2017-04-22 NOTE — Telephone Encounter (Signed)
Pt called and left a voicemail asking to get a copy of her ultrasound and report. Pt asked to pick up tomorrow. Please advise, thank you!

## 2017-04-22 NOTE — Progress Notes (Signed)
MARCIANA, UPLINGER ([id number]) Visit Report for 04/21/2017 Chief Complaint Document Details Patient Name: Teresa Krueger, Teresa Krueger Date of Service: 04/21/2017 1:45 PM Medical Record Number: [id number] Patient Account Number: [id number] Date of Birth/Sex: 08-16-1933 (83 y.o. Female) Treating RN: Cornell Barman Primary Care Provider: Ramonita Lab Other Clinician: Referring Provider: Ramonita Lab Treating Provider/Extender: Frann Rider in Treatment: 1 Information Obtained from: Patient Chief Complaint Patients presents for treatment of an open diabetic ulcer to the left lower extremity caused by trauma 2 weeks ago Electronic Signature(s) Signed: 04/21/2017 2:19:03 PM By: Christin Fudge MD, FACS Entered By: Christin Fudge on 04/21/2017 14:19:03 Teresa Krueger ([id number]) -------------------------------------------------------------------------------- HPI Details Patient Name: Teresa Krueger Date of Service: 04/21/2017 1:45 PM Medical Record Number: [id number] Patient Account Number: [id number] Date of Birth/Sex: [birth year] (83 y.o. Female) Treating RN: Cornell Barman Primary Care Provider: Ramonita Lab Other Clinician: Referring Provider: Ramonita Lab Treating Provider/Extender: Frann Rider in Treatment: 1 History of Present Illness Location: left lower extremity Quality: Patient reports experiencing a sharp pain to affected area(s). Severity: Patient states wound are getting worse. Duration: Patient has had the wound for < 2 weeks prior to presenting for treatment Timing: Pain in wound is Intermittent (comes and goes Context: The wound occurred when the patient had a blunt injury to the left lower extremity which caused a large hematoma Modifying Factors: Other treatment(s) tried include:has been put on oral antibiotics by her PCP Associated Signs and Symptoms: Patient reports having increase swelling. HPI Description: 83 year old patient, known to be a diabetic was recently seen  by her PCP regarding an injury to the left lower extremity which she has had about 2 weeks. She injured it using an electric wheelchair and hitting the ramp on her [place] Lei and had an x-ray in the ER which was negative for any fracture. The patient however says she has not had any x-ray done in the ER. I did not find any x-ray reported in her electronic medical record. She has minimal drainage from the wound and it was covered with eschar. Past medical history significant for breast cancer, COPD, diabetes mellitus type 2, essential hypertension, morbid obesity, status post cardiac pacemaker, cholecystectomy, mastectomy, EGD for Barrett's esophagus. Patient was given doxycycline for 10 days and referred to the wound center. The patient has had a bilateral lower extremity venous reflux examination done in February 2018 and it was showing no venous incompetence bilaterally. 04/21/2017 -- after review of her wound today I have recommended a snap vacuum system and we'll try and get this authorized by her insurance Electronic Signature(s) Signed: 04/21/2017 2:19:42 PM By: Christin Fudge MD, FACS Entered By: Christin Fudge on 04/21/2017 14:19:39 Teresa Krueger ([id number]) -------------------------------------------------------------------------------- Physical Exam Details Patient Name: Teresa Krueger Date of Service: 04/21/2017 1:45 PM Medical Record Number: [id number] Patient Account Number: [id number] Date of Birth/Sex: [birth year] (83 y.o. Female) Treating RN: Cornell Barman Primary Care Provider: Ramonita Lab Other Clinician: Referring Provider: Ramonita Lab Treating Provider/Extender: Frann Rider in Treatment: 1 Constitutional . Pulse regular. Respirations normal and unlabored. Afebrile. . Eyes Nonicteric. Reactive to light. Ears, Nose, Mouth, and Throat Lips, teeth, and gums WNL.Marland Kitchen Moist mucosa without lesions. Neck supple and nontender. No palpable supraclavicular or cervical  adenopathy. Normal sized without goiter. Respiratory WNL. No retractions.. Cardiovascular Pedal Pulses WNL. No clubbing, cyanosis or edema. Lymphatic No adneopathy. No adenopathy. No adenopathy. Musculoskeletal Adexa without tenderness or enlargement.. Digits and nails w/o clubbing, cyanosis, infection, petechiae, ischemia, or inflammatory conditions.. Integumentary (Hair, Skin) No suspicious  lesions. No crepitus or fluctuance. No peri-wound warmth or erythema. No masses.Marland Kitchen Psychiatric Judgement and insight Intact.. No evidence of depression, anxiety, or agitation.. Notes the hematoma which is coming out of the wound is less and no sharp debridement was required today. I was able to wash it out with moist saline gauze and a Q-tip and I believe she will be ready for a snap vacuum system soon Electronic Signature(s) Signed: 04/21/2017 2:20:47 PM By: Christin Fudge MD, FACS Entered By: Christin Fudge on 04/21/2017 14:20:47 Teresa Krueger ([id number]) -------------------------------------------------------------------------------- Physician Orders Details Patient Name: Teresa Krueger Date of Service: 04/21/2017 1:45 PM Medical Record Number: [id number] Patient Account Number: [id number] Date of Birth/Sex: Apr 20, 1934 (83 y.o. Female) Treating RN: Cornell Barman Primary Care Provider: Ramonita Lab Other Clinician: Referring Provider: Ramonita Lab Treating Provider/Extender: Frann Rider in Treatment: 1 Verbal / Phone Orders: No Diagnosis Coding Wound Cleansing Wound #1 Left,Medial Lower Leg o Clean wound with Normal Saline. o May Shower, gently pat wound dry prior to applying new dressing. Anesthetic Wound #1 Left,Medial Lower Leg o Topical Lidocaine 4% cream applied to wound bed prior to debridement Primary Wound Dressing Wound #1 Left,Medial Lower Leg o Aquacel Ag Secondary Dressing Wound #1 Left,Medial Lower Leg o Boardered Foam Dressing Dressing Change  Frequency Wound #1 Left,Medial Lower Leg o Change dressing every other day. Follow-up Appointments Wound #1 Left,Medial Lower Leg o Return Appointment in 1 week. Edema Control Wound #1 Left,Medial Lower Leg o Elevate legs to the level of the heart and pump ankles as often as possible Medications-please add to medication list. Wound #1 Left,Medial Lower Leg o P.O. Antibiotics - Continue with antibiotics. Notes Snap Vac authorization Electronic Signature(s) Signed: 04/21/2017 4:25:36 PM By: Christin Fudge MD, FACS Signed: 04/21/2017 5:39:55 PM By: Gretta Cool, BSN, RN, CWS, Kim RN, BSN Entered By: Gretta Cool, BSN, RN, CWS, Kim on 04/21/2017 14:15:28 Teresa Krueger ([id number] Krueger ([id number]) -------------------------------------------------------------------------------- Problem List Details Patient Name: Teresa Krueger Date of Service: 04/21/2017 1:45 PM Medical Record Number: [id number] Patient Account Number: [id number] Date of Birth/Sex: 03-02-1934 (83 y.o. Female) Treating RN: Cornell Barman Primary Care Provider: Ramonita Lab Other Clinician: Referring Provider: Ramonita Lab Treating Provider/Extender: Frann Rider in Treatment: 1 Active Problems ICD-10 Encounter Code Description Active Date Diagnosis E11.622 Type 2 diabetes mellitus with other skin ulcer 04/14/2017 Yes L97.222 Non-pressure chronic ulcer of left calf with fat layer exposed 04/14/2017 Yes S81.812A Laceration without foreign body, left lower leg, initial encounter 04/14/2017 Yes M70.962 Unspecified soft tissue disorder related to use, overuse and 04/14/2017 Yes pressure, left lower leg E66.01 Morbid (severe) obesity due to excess calories 04/14/2017 Yes Z99.3 Dependence on wheelchair 04/14/2017 Yes Inactive Problems Resolved Problems Electronic Signature(s) Signed: 04/21/2017 2:18:36 PM By: Christin Fudge MD, FACS Entered By: Christin Fudge on 04/21/2017 14:18:36 Teresa Krueger  ([id number]) -------------------------------------------------------------------------------- Progress Note Details Patient Name: Teresa Krueger Date of Service: 04/21/2017 1:45 PM Medical Record Number: [id number] Patient Account Number: [id number] Date of Birth/Sex: May 23, 1934 (83 y.o. Female) Treating RN: Cornell Barman Primary Care Provider: Ramonita Lab Other Clinician: Referring Provider: Ramonita Lab Treating Provider/Extender: Frann Rider in Treatment: 1 Subjective Chief Complaint Information obtained from Patient Patients presents for treatment of an open diabetic ulcer to the left lower extremity caused by trauma 2 weeks ago History of Present Illness (HPI) The following HPI elements were documented for the patient's wound: Location: left lower extremity Quality: Patient reports experiencing a sharp pain to affected area(s). Severity: Patient states wound  are getting worse. Duration: Patient has had the wound for < 2 weeks prior to presenting for treatment Timing: Pain in wound is Intermittent (comes and goes Context: The wound occurred when the patient had a blunt injury to the left lower extremity which caused a large hematoma Modifying Factors: Other treatment(s) tried include:has been put on oral antibiotics by her PCP Associated Signs and Symptoms: Patient reports having increase swelling. 83 year old patient, known to be a diabetic was recently seen by her PCP regarding an injury to the left lower extremity which she has had about 2 weeks. She injured it using an electric wheelchair and hitting the ramp on her [place]i and had an x-ray in the ER which was negative for any fracture. The patient however says she has not had any x-ray done in the ER. I did not find any x-ray reported in her electronic medical record. She has minimal drainage from the wound and it was covered with eschar. Past medical history significant for breast cancer, COPD, diabetes mellitus type 2,  essential hypertension, morbid obesity, status post cardiac pacemaker, cholecystectomy, mastectomy, EGD for Barrett's esophagus. Patient was given doxycycline for 10 days and referred to the wound center. The patient has had a bilateral lower extremity venous reflux examination done in February 2018 and it was showing no venous incompetence bilaterally. 04/21/2017 -- after review of her wound today I have recommended a snap vacuum system and we'll try and get this authorized by her insurance Patient History Information obtained from Patient. Family History Diabetes - Paternal Grandparents, Heart Disease - [place] Grandparents, Hypertension - Mother, No family history of Cancer, Kidney Disease, Lung Disease, Seizures, Stroke, Thyroid Problems, Tuberculosis. Social History Former smoker - Quit 1994, Marital Status - Married, Alcohol Use - Never, Drug Use - No History, Caffeine Use - Never. Medical And Surgical History Notes Oncologic Breast Cancer 1999 Teresa Krueger, Teresa Krueger ([id number]) Objective Constitutional Pulse regular. Respirations normal and unlabored. Afebrile. Vitals Time Taken: 1:52 PM, Height: 60 in, Weight: 251 lbs, BMI: 49, Temperature: 97.8 F, Pulse: 67 bpm, Respiratory Rate: 16 breaths/min, Blood Pressure: 160/34 mmHg. General Notes: MD Notified of BP. Patient instructed to follow up with heart Doctor. Eyes Nonicteric. Reactive to light. Ears, Nose, Mouth, and Throat Lips, teeth, and gums WNL.Marland Kitchen Moist mucosa without lesions. Neck supple and nontender. No palpable supraclavicular or cervical adenopathy. Normal sized without goiter. Respiratory WNL. No retractions.. Cardiovascular Pedal Pulses WNL. No clubbing, cyanosis or edema. Lymphatic No adneopathy. No adenopathy. No adenopathy. Musculoskeletal Adexa without tenderness or enlargement.. Digits and nails w/o clubbing, cyanosis, infection, petechiae, ischemia, or inflammatory  conditions.Marland Kitchen Psychiatric Judgement and insight Intact.. No evidence of depression, anxiety, or agitation.. General Notes: the hematoma which is coming out of the wound is less and no sharp debridement was required today. I was able to wash it out with moist saline gauze and a Q-tip and I believe she will be ready for a snap vacuum system soon Integumentary (Hair, Skin) No suspicious lesions. No crepitus or fluctuance. No peri-wound warmth or erythema. No masses.. Wound #1 status is Open. Original cause of wound was Trauma. The wound is located on the Left,Medial Lower Leg. The wound measures 1.5cm length x 4.5cm width x 1.5cm depth; 5.301cm^2 area and 7.952cm^3 volume. There is Fat Layer (Subcutaneous Tissue) Exposed exposed. There is no undermining noted, however, there is tunneling at 9:00 with a maximum distance of 1.5cm. There is a large amount of serosanguineous drainage noted. The wound margin is distinct with the  outline attached to the wound base. There is medium (34-66%) red granulation within the wound bed. There is a medium (34-66%) amount of necrotic tissue within the wound bed including Adherent Slough. The periwound skin appearance exhibited: Induration, Erythema. The periwound skin appearance did not exhibit: Callus, Crepitus, Excoriation, Rash, Scarring, Dry/Scaly, Maceration, Atrophie Blanche, Cyanosis, Ecchymosis, Hemosiderin Staining, Mottled, Pallor, Rubor. The Teresa Krueger, Teresa Krueger. ([id number]) surrounding wound skin color is noted with erythema which is circumferential. Periwound temperature was noted as No Abnormality. The periwound has tenderness on palpation. Assessment Active Problems ICD-10 E11.622 - Type 2 diabetes mellitus with other skin ulcer L97.222 - Non-pressure chronic ulcer of left calf with fat layer exposed S81.812A - Laceration without foreign body, left lower leg, initial encounter M70.962 - Unspecified soft tissue disorder related to use, overuse and  pressure, left lower leg E66.01 - Morbid (severe) obesity due to excess calories Z99.3 - Dependence on wheelchair Plan Wound Cleansing: Wound #1 Left,Medial Lower Leg: Clean wound with Normal Saline. May Shower, gently pat wound dry prior to applying new dressing. Anesthetic: Wound #1 Left,Medial Lower Leg: Topical Lidocaine 4% cream applied to wound bed prior to debridement Primary Wound Dressing: Wound #1 Left,Medial Lower Leg: Aquacel Ag Secondary Dressing: Wound #1 Left,Medial Lower Leg: Boardered Foam Dressing Dressing Change Frequency: Wound #1 Left,Medial Lower Leg: Change dressing every other day. Follow-up Appointments: Wound #1 Left,Medial Lower Leg: Return Appointment in 1 week. Edema Control: Wound #1 Left,Medial Lower Leg: Elevate legs to the level of the heart and pump ankles as often as possible Medications-please add to medication list.: Wound #1 Left,Medial Lower Leg: P.O. Antibiotics - Continue with antibiotics. General Notes: Snap Vac authorization Teresa Krueger, Teresa Krueger ([id number]) I have recommended: 1. Washing this area daily with soap and water and packing it with a silver alginate rope. 2. Elevation and exercise 3. Adequate protein, vitamin A, vitamin C and zinc 4. I anticipate next week she will be ready for a Snap wound VAC and prolonged treatment. She and her husband have had all questions answered to their satisfaction. Electronic Signature(s) Signed: 04/21/2017 2:22:11 PM By: Christin Fudge MD, FACS Entered By: Christin Fudge on 04/21/2017 14:22:11 Teresa Krueger ([id number]) -------------------------------------------------------------------------------- ROS/PFSH Details Patient Name: Teresa Krueger Date of Service: 04/21/2017 1:45 PM Medical Record Number: [id number] Patient Account Number: [id number] Date of Birth/Sex: 11/14/1933 (83 y.o. Female) Treating RN: Cornell Barman Primary Care Provider: Ramonita Lab Other Clinician: Referring  Provider: Ramonita Lab Treating Provider/Extender: Frann Rider in Treatment: 1 Information Obtained From Patient Wound History Do you currently have one or more open woundso Yes How many open wounds do you currently haveo 1 Approximately how long have you had your woundso 2 weeks How have you been treating your wound(s) until nowo vasaline Has your wound(s) ever healed and then re-openedo No Have you had any lab work done in the past montho No Have you tested positive for an antibiotic resistant organism (MRSA, VRE)o No Have you tested positive for osteomyelitis (bone infection)o No Have you had any tests for circulation on your legso No Eyes Medical History: Positive for: Cataracts - Removed Negative for: Glaucoma; Optic Neuritis Ear/Nose/Mouth/Throat Medical History: Negative for: Chronic sinus problems/congestion; Middle ear problems Hematologic/Lymphatic Medical History: Negative for: Anemia; Hemophilia; Human Immunodeficiency Virus; Lymphedema; Sickle Cell Disease Respiratory Medical History: Positive for: Chronic Obstructive Pulmonary Disease (COPD); Sleep Apnea Negative for: Aspiration; Asthma; Pneumothorax; Tuberculosis Cardiovascular Medical History: Positive for: Hypertension Negative for: Angina; Arrhythmia; Congestive Heart Failure; Coronary Artery Disease; Deep Vein  Thrombosis; Hypotension; Myocardial Infarction; Peripheral Arterial Disease; Peripheral Venous Disease; Phlebitis; Vasculitis Gastrointestinal Medical History: Negative for: Cirrhosis ; Colitis; Crohnos; Hepatitis A; Hepatitis B; Hepatitis C Endocrine Teresa Krueger, Teresa Krueger ([id number]) Medical History: Positive for: Type II Diabetes Negative for: Type I Diabetes Time with diabetes: 20 plus years Treated with: Insulin Blood sugar tested every day: Yes Tested : 3 Blood sugar testing results: Breakfast: 92; Bedtime: 120 Genitourinary Medical History: Negative for: End Stage Renal  Disease Immunological Medical History: Negative for: Lupus Erythematosus; Raynaudos; Scleroderma Integumentary (Skin) Medical History: Negative for: History of Burn; History of pressure wounds Musculoskeletal Medical History: Positive for: Gout; Osteoarthritis Negative for: Rheumatoid Arthritis; Osteomyelitis Neurologic Medical History: Positive for: Neuropathy - Hands and feet Negative for: Dementia; Quadriplegia; Paraplegia; Seizure Disorder Oncologic Medical History: Positive for: Received Chemotherapy Negative for: Received Radiation Past Medical History Notes: Breast Cancer 1999 HBO Extended History Items Eyes: Cataracts Immunizations Pneumococcal Vaccine: Received Pneumococcal Vaccination: Yes Implantable Devices Family and Social History Cancer: No; Diabetes: Yes - Paternal Grandparents; Heart Disease: Yes - Mother,Father,Maternal Grandparents,Paternal Grandparents; Hypertension: Yes - Mother; Kidney Disease: No; Lung Disease: No; Seizures: No; Stroke: No; Thyroid Problems: No; Tuberculosis: No; Former smoker - Quit 1994; Marital Status - Married; Alcohol Use: Never; Drug Use: No Teresa Krueger, Teresa Krueger. ([id number]) History; Caffeine Use: Never; Advanced Directives: Yes (Not Provided); Patient does not want information on Advanced Directives; Living Will: Yes (Not Provided); Medical Power of Attorney: Yes (Not Provided) Physician Affirmation I have reviewed and agree with the above information. Electronic Signature(s) Signed: 04/21/2017 4:25:36 PM By: Christin Fudge MD, FACS Signed: 04/21/2017 5:39:55 PM By: Gretta Cool BSN, RN, CWS, Kim RN, BSN Entered By: Christin Fudge on 04/21/2017 14:19:54 Teresa Krueger ([id number]) -------------------------------------------------------------------------------- Hope Details Patient Name: Teresa Krueger Date of Service: 04/21/2017 Medical Record Number: [id number] Patient Account Number: [id number] Date of Birth/Sex:  Jun 07, 1934 (83 y.o. Female) Treating RN: Cornell Barman Primary Care Provider: Ramonita Lab Other Clinician: Referring Provider: Ramonita Lab Treating Provider/Extender: Frann Rider in Treatment: 1 Diagnosis Coding ICD-10 Codes Code Description E11.622 Type 2 diabetes mellitus with other skin ulcer L97.222 Non-pressure chronic ulcer of left calf with fat layer exposed S81.812A Laceration without foreign body, left lower leg, initial encounter M70.962 Unspecified soft tissue disorder related to use, overuse and pressure, left lower leg E66.01 Morbid (severe) obesity due to excess calories Z99.3 Dependence on wheelchair Facility Procedures CPT4 Code: [id number] Description: 99213 - WOUND CARE VISIT-LEV 3 EST PT Modifier: Quantity: 1 Physician Procedures CPT4 Code: [id number] Description: 67672 - WC PHYS LEVEL 3 - EST PT ICD-10 Diagnosis Description E11.622 Type 2 diabetes mellitus with other skin ulcer L97.222 Non-pressure chronic ulcer of left calf with fat layer expos S81.812A Laceration without foreign body, left lower  leg, initial enc E66.01 Morbid (severe) obesity due to excess calories Modifier: ed ounter Quantity: 1 Electronic Signature(s) Signed: 04/21/2017 4:25:36 PM By: Christin Fudge MD, FACS Signed: 04/21/2017 5:39:55 PM By: Gretta Cool, BSN, RN, CWS, Kim RN, BSN Previous Signature: 04/21/2017 2:22:31 PM Version By: Christin Fudge MD, FACS Entered By: Gretta Cool, BSN, RN, CWS, Kim on 04/21/2017 14:24:25

## 2017-04-22 NOTE — Telephone Encounter (Signed)
Called the patient back to let her know that her records will be ready for pick up tomorrow.

## 2017-04-22 NOTE — Progress Notes (Signed)
KENYA, KOOK ([id number]) Visit Report for 04/21/2017 Arrival Information Details Patient Name: Teresa Krueger, Teresa Krueger Date of Service: 04/21/2017 1:45 PM Medical Record Number: [id number] Patient Account Number: [id number] Date of Birth/Sex: [birth year] (83 y.o. Female) Treating RN: Cornell Barman Primary Care Xavier Fournier: Ramonita Lab Other Clinician: Referring Terral Cooks: Ramonita Lab Treating Kerri-Anne Haeberle/Extender: Frann Rider in Treatment: 1 Visit Information History Since Last Visit Added or deleted any medications: No Patient Arrived: Wheel Chair Any new allergies or adverse reactions: No Arrival Time: 13:50 Had a fall or experienced change in No Accompanied By: husband activities of Teresa Krueger living that may affect Transfer Assistance: Manual risk of falls: Patient Identification Verified: Yes Signs or symptoms of abuse/neglect since last visito No Secondary Verification Process Yes Hospitalized since last visit: No Completed: Has Dressing in Place as Prescribed: Yes Patient Requires Transmission-Based No Pain Present Now: No Precautions: Patient Has Alerts: Yes Patient Alerts: Patient on Blood Thinner Type II Diabetic Plavix Electronic Signature(s) Signed: 04/21/2017 5:39:55 PM By: Teresa Krueger, BSN, RN, CWS, Kim RN, BSN Entered By: Teresa Krueger, BSN, RN, CWS, Kim on 04/21/2017 13:51:02 Teresa Krueger ([id number]) -------------------------------------------------------------------------------- Clinic Level of Care Assessment Details Patient Name: Teresa Krueger Date of Service: 04/21/2017 1:45 PM Medical Record Number: [id number] Patient Account Number: [id number] Date of Birth/Sex: 1933/11/14 (83 y.o. Female) Treating RN: Cornell Barman Primary Care Antrone Walla: Ramonita Lab Other Clinician: Referring Shandora Koogler: Ramonita Lab Treating Zyrell Carmean/Extender: Frann Rider in Treatment: 1 Clinic Level of Care Assessment Items TOOL 4 Quantity Score []  - Use when only an EandM is  performed on FOLLOW-UP visit 0 ASSESSMENTS - Nursing Assessment / Reassessment X - Reassessment of Co-morbidities (includes updates in patient status) 1 10 X- 1 5 Reassessment of Adherence to Treatment Plan ASSESSMENTS - Wound and Skin Assessment / Reassessment X - Simple Wound Assessment / Reassessment - one wound 1 5 []  - 0 Complex Wound Assessment / Reassessment - multiple wounds []  - 0 Dermatologic / Skin Assessment (not related to wound area) ASSESSMENTS - Focused Assessment []  - Circumferential Edema Measurements - multi extremities 0 []  - 0 Nutritional Assessment / Counseling / Intervention []  - 0 Lower Extremity Assessment (monofilament, tuning fork, pulses) []  - 0 Peripheral Arterial Disease Assessment (using hand held doppler) ASSESSMENTS - Ostomy and/or Continence Assessment and Care []  - Incontinence Assessment and Management 0 []  - 0 Ostomy Care Assessment and Management (repouching, etc.) PROCESS - Coordination of Care X - Simple Patient / Family Education for ongoing care 1 15 []  - 0 Complex (extensive) Patient / Family Education for ongoing care []  - 0 Staff obtains Programmer, systems, Records, Test Results / Process Orders []  - 0 Staff telephones HHA, Nursing Homes / Clarify orders / etc []  - 0 Routine Transfer to another Facility (non-emergent condition) []  - 0 Routine Hospital Admission (non-emergent condition) []  - 0 New Admissions / Biomedical engineer / Ordering NPWT, Apligraf, etc. []  - 0 Emergency Hospital Admission (emergent condition) X- 1 10 Simple Discharge Coordination Teresa Krueger, Teresa Krueger ([id number]) []  - 0 Complex (extensive) Discharge Coordination PROCESS - Special Needs []  - Pediatric / Minor Patient Management 0 []  - 0 Isolation Patient Management []  - 0 Hearing / Language / Visual special needs []  - 0 Assessment of Community assistance (transportation, D/C planning, etc.) []  - 0 Additional assistance / Altered mentation []  - 0 Support  Surface(s) Assessment (bed, cushion, seat, etc.) INTERVENTIONS - Wound Cleansing / Measurement X - Simple Wound Cleansing - one wound 1 5 []  - 0 Complex Wound Cleansing -  multiple wounds X- 1 5 Wound Imaging (photographs - any number of wounds) []  - 0 Wound Tracing (instead of photographs) X- 1 5 Simple Wound Measurement - one wound []  - 0 Complex Wound Measurement - multiple wounds INTERVENTIONS - Wound Dressings []  - Small Wound Dressing one or multiple wounds 0 X- 1 15 Medium Wound Dressing one or multiple wounds []  - 0 Large Wound Dressing one or multiple wounds []  - 0 Application of Medications - topical []  - 0 Application of Medications - injection INTERVENTIONS - Miscellaneous []  - External ear exam 0 []  - 0 Specimen Collection (cultures, biopsies, blood, body fluids, etc.) []  - 0 Specimen(s) / Culture(s) sent or taken to Lab for analysis []  - 0 Patient Transfer (multiple staff / Civil Service fast streamer / Similar devices) []  - 0 Simple Staple / Suture removal (25 or less) []  - 0 Complex Staple / Suture removal (26 or more) []  - 0 Hypo / Hyperglycemic Management (close monitor of Blood Glucose) []  - 0 Ankle / Brachial Index (ABI) - do not check if billed separately X- 1 5 Vital Signs Heinecke, Emerie G. ([id number]) Has the patient been seen at the hospital within the last three years: Yes Total Score: 80 Level Of Care: New/Established - Level 3 Electronic Signature(s) Signed: 04/21/2017 5:39:55 PM By: Teresa Krueger, BSN, RN, CWS, Kim RN, BSN Entered By: Teresa Krueger, BSN, RN, CWS, Kim on 04/21/2017 14:23:45 Teresa Krueger ([id number]) -------------------------------------------------------------------------------- Encounter Discharge Information Details Patient Name: Teresa Krueger Date of Service: 04/21/2017 1:45 PM Medical Record Number: [id number] Patient Account Number: [id number] Date of Birth/Sex: [birth year] (83 y.o. Female) Treating RN: Cornell Barman Primary Care Savalas Monje:  Ramonita Lab Other Clinician: Referring Norva Bowe: Ramonita Lab Treating Raya Mckinstry/Extender: Frann Rider in Treatment: 1 Encounter Discharge Information Items Discharge Pain Level: 0 Discharge Condition: Stable Ambulatory Status: Wheelchair Discharge Destination: Home Transportation: Private Auto Accompanied By: self Schedule Follow-up Appointment: Yes Medication Reconciliation completed and Yes provided to Patient/Care Jacqueleen Pulver: Patient Clinical Summary of Care: Declined Electronic Signature(s) Signed: 04/21/2017 5:39:55 PM By: Teresa Krueger, BSN, RN, CWS, Kim RN, BSN Entered By: Teresa Krueger, BSN, RN, CWS, Kim on 04/21/2017 14:25:51 Teresa Krueger ([id number]) -------------------------------------------------------------------------------- Lower Extremity Assessment Details Patient Name: Teresa Krueger Date of Service: 04/21/2017 1:45 PM Medical Record Number: [id number] Patient Account Number: [id number] Date of Birth/Sex: 1933-09-14 (83 y.o. Female) Treating RN: Cornell Barman Primary Care Jaylaa Gallion: Ramonita Lab Other Clinician: Referring Jaceion Aday: Ramonita Lab Treating Zyniah Ferraiolo/Extender: Frann Rider in Treatment: 1 Edema Assessment Assessed: [Left: No] [Right: No] [Left: Edema] [Right: :] Calf Left: Right: Point of Measurement: 30 cm From Medial Instep 51.5 cm cm Ankle Left: Right: Point of Measurement: 10 cm From Medial Instep 31 cm cm Vascular Assessment Pulses: Dorsalis Pedis Palpable: [Left:Yes] Posterior Tibial Extremity colors, hair growth, and conditions: Extremity Color: [Left:Red] Hair Growth on Extremity: [Left:Yes] Temperature of Extremity: [Left:Warm] Capillary Refill: [Left:> 3 seconds] Toe Nail Assessment Left: Right: Thick: No Discolored: No Deformed: No Improper Length and Hygiene: No Electronic Signature(s) Signed: 04/21/2017 5:39:55 PM By: Teresa Krueger, BSN, RN, CWS, Kim RN, BSN Entered By: Teresa Krueger, BSN, RN, CWS, Kim on 04/21/2017  14:03:14 Teresa Krueger ([id number]) -------------------------------------------------------------------------------- Multi Wound Chart Details Patient Name: Teresa Krueger Date of Service: 04/21/2017 1:45 PM Medical Record Number: [id number] Patient Account Number: [id number] Date of Birth/Sex: 07/21/1933 (83 y.o. Female) Treating RN: Cornell Barman Primary Care Glendola Friedhoff: Ramonita Lab Other Clinician: Referring Makell Cyr: Ramonita Lab Treating Addilee Neu/Extender: Frann Rider in Treatment: 1 Vital Signs Height(in): 60 Pulse(bpm): 97  Weight(lbs): 251 Blood Pressure(mmHg): 160/34 Body Mass Index(BMI): 49 Temperature(F): 97.8 Respiratory Rate 16 (breaths/min): Photos: [N/A:N/A] Wound Location: Left Lower Leg - Medial N/A N/A Wounding Event: Trauma N/A N/A Primary Etiology: Trauma, Other N/A N/A Comorbid History: Cataracts, Chronic Obstructive N/A N/A Pulmonary Disease (COPD), Sleep Apnea, Hypertension, Type II Diabetes, Gout, Osteoarthritis, Neuropathy, Received Chemotherapy Date Acquired: 04/03/2017 N/A N/A Weeks of Treatment: 1 N/A N/A Wound Status: Open N/A N/A Measurements L x W x D 1.5x4.5x1.5 N/A N/A (cm) Area (cm) : 5.301 N/A N/A Volume (cm) : 7.952 N/A N/A % Reduction in Area: 18.20% N/A N/A % Reduction in Volume: -1127.20% N/A N/A Position 1 (o'clock): 9 Maximum Distance 1 (cm): 1.5 Tunneling: Yes N/A N/A Classification: Full Thickness Without N/A N/A Exposed Support Structures Exudate Amount: Large N/A N/A Exudate Type: Serosanguineous N/A N/A Exudate Color: red, brown N/A N/A Wound Margin: Distinct, outline attached N/A N/A Granulation Amount: Medium (34-66%) N/A N/A Granulation Quality: Red N/A N/A Necrotic Amount: Medium (34-66%) N/A N/A Exposed Structures: N/A N/A Teresa Krueger ([id number]) Fat Layer (Subcutaneous Tissue) Exposed: Yes Fascia: No Tendon: No Muscle: No Joint: No Bone: No Epithelialization: None N/A N/A Periwound  Skin Texture: Induration: Yes N/A N/A Excoriation: No Callus: No Crepitus: No Rash: No Scarring: No Periwound Skin Moisture: Maceration: No N/A N/A Dry/Scaly: No Periwound Skin Color: Erythema: Yes N/A N/A Atrophie Blanche: No Cyanosis: No Ecchymosis: No Hemosiderin Staining: No Mottled: No Pallor: No Rubor: No Erythema Location: Circumferential N/A N/A Temperature: No Abnormality N/A N/A Tenderness on Palpation: Yes N/A N/A Wound Preparation: Ulcer Cleansing: N/A N/A Rinsed/Irrigated with Saline Topical Anesthetic Applied: Other: lidocaine 4% Treatment Notes Electronic Signature(s) Signed: 04/21/2017 2:18:46 PM By: Christin Fudge MD, FACS Entered By: Christin Fudge on 04/21/2017 14:18:46 Teresa Krueger ([id number]) -------------------------------------------------------------------------------- [place] Details Patient Name: Teresa Krueger Date of Service: 04/21/2017 1:45 PM Medical Record Number: [id number] Patient Account Number: [id number] Date of Birth/Sex: 02-17-1934 (83 y.o. Female) Treating RN: Cornell Barman Primary Care Ariyannah Pauling: Ramonita Lab Other Clinician: Referring Jonea Bukowski: Ramonita Lab Treating Janiylah Hannis/Extender: Frann Rider in Treatment: 1 Active Inactive ` Necrotic Tissue Nursing Diagnoses: Impaired tissue integrity related to necrotic/devitalized tissue Knowledge deficit related to management of necrotic/devitalized tissue Goals: Necrotic/devitalized tissue will be minimized in the wound bed Date Initiated: 04/14/2017 Target Resolution Date: 04/28/2017 Goal Status: Active Interventions: Assess patient pain level pre-, during and post procedure and prior to discharge Provide education on necrotic tissue and debridement process Treatment Activities: Apply topical anesthetic as ordered : 04/14/2017 Notes: ` Orientation to the Wound Care Program Nursing Diagnoses: Knowledge deficit related to the wound healing  center program Goals: Patient/caregiver will verbalize understanding of the [place] Date Initiated: 04/14/2017 Target Resolution Date: 04/21/2017 Goal Status: Active Interventions: Provide education on orientation to the wound center Notes: ` Wound/Skin Impairment Nursing Diagnoses: Impaired tissue integrity GoalsMARCIANNA, Teresa Krueger ([id number]) Ulcer/skin breakdown will heal within 14 weeks Date Initiated: 04/14/2017 Target Resolution Date: 08/04/2017 Goal Status: Active Interventions: Assess ulceration(s) every visit Treatment Activities: Referred to DME Trennon Torbeck for dressing supplies : 04/14/2017 Notes: Electronic Signature(s) Signed: 04/21/2017 5:39:55 PM By: Teresa Krueger, BSN, RN, CWS, Kim RN, BSN Entered By: Teresa Krueger, BSN, RN, CWS, Kim on 04/21/2017 14:03:23 Teresa Krueger ([id number]) -------------------------------------------------------------------------------- Pain Assessment Details Patient Name: Teresa Krueger Date of Service: 04/21/2017 1:45 PM Medical Record Number: [id number] Patient Account Number: [id number] Date of Birth/Sex: 1934/02/12 (83 y.o. Female) Treating RN: Cornell Barman Primary Care Graden Hoshino: Ramonita Lab Other Clinician: Referring  Kadarious Dikes: Ramonita Lab Treating Anastasia Tompson/Extender: Frann Rider in Treatment: 1 Active Problems Location of Pain Severity and Description of Pain Patient Has Paino No Site Locations With Dressing Change: Yes Rate the pain. Current Pain Level: 5 Character of Pain Describe the Pain: Burning, Tender Pain Management and Medication Current Pain Management: Goals for Pain Management Topical or injectable lidocaine is offered to patient for acute pain when surgical debridement is performed. If needed, Patient is instructed to use over the counter pain medication for the following 24-48 hours after debridement. Wound care MDs do not prescribed pain medications. Patient has chronic pain or  uncontrolled pain. Patient has been instructed to make an appointment with their Primary Care Physician for pain management. Electronic Signature(s) Signed: 04/21/2017 5:39:55 PM By: Teresa Krueger, BSN, RN, CWS, Kim RN, BSN Entered By: Teresa Krueger, BSN, RN, CWS, Kim on 04/21/2017 13:51:21 Teresa Krueger ([id number]) -------------------------------------------------------------------------------- Patient/Caregiver Education Details Patient Name: Teresa Krueger Date of Service: 04/21/2017 1:45 PM Medical Record Number: [id number] Patient Account Number: [id number] Date of Birth/Gender: [birth year] (83 y.o. Female) Treating RN: Cornell Barman Primary Care Physician: Ramonita Lab Other Clinician: Referring Physician: Ramonita Lab Treating Physician/Extender: Frann Rider in Treatment: 1 Education Assessment Education Provided To: Patient Education Topics Provided Wound/Skin Impairment: Handouts: Caring for Your Ulcer, Other: wound care as prescribed Methods: Demonstration, Explain/Verbal Responses: State content correctly Electronic Signature(s) Signed: 04/21/2017 5:39:55 PM By: Teresa Krueger, BSN, RN, CWS, Kim RN, BSN Entered By: Teresa Krueger, BSN, RN, CWS, Kim on 04/21/2017 14:26:08 Teresa Krueger ([id number]) -------------------------------------------------------------------------------- Wound Assessment Details Patient Name: Teresa Krueger Date of Service: 04/21/2017 1:45 PM Medical Record Number: [id number] Patient Account Number: [id number] Date of Birth/Sex: [birth year] (83 y.o. Female) Treating RN: Cornell Barman Primary Care Shawan Corella: Ramonita Lab Other Clinician: Referring Lyriq Finerty: Ramonita Lab Treating Roseana Rhine/Extender: Frann Rider in Treatment: 1 Wound Status Wound Number: 1 Primary Trauma, Other Etiology: Wound Location: Left Lower Leg - Medial Wound Open Wounding Event: Trauma Status: Date Acquired: 04/03/2017 Comorbid Cataracts, Chronic Obstructive Pulmonary Weeks Of  Treatment: 1 History: Disease (COPD), Sleep Apnea, Hypertension, Clustered Wound: No Type II Diabetes, Gout, Osteoarthritis, Neuropathy, Received Chemotherapy Photos Wound Measurements Length: (cm) 1.5 Width: (cm) 4.5 Depth: (cm) 1.5 Area: (cm) 5.301 Volume: (cm) 7.952 % Reduction in Area: 18.2% % Reduction in Volume: -1127.2% Epithelialization: None Tunneling: Yes Position (o'clock): 9 Maximum Distance: (cm) 1.5 Undermining: No Wound Description Full Thickness Without Exposed Support Foul Odo Classification: Structures Slough/F Wound Margin: Distinct, outline attached Exudate Large Amount: Exudate Type: Serosanguineous Exudate Color: red, brown r After Cleansing: No ibrino Yes Wound Bed Granulation Amount: Medium (34-66%) Exposed Structure Granulation Quality: Red Fascia Exposed: No Necrotic Amount: Medium (34-66%) Fat Layer (Subcutaneous Tissue) Exposed: Yes Necrotic Quality: Adherent Slough Tendon Exposed: No Muscle Exposed: No Joint Exposed: No [place], Teresa G. ([id number]) Bone Exposed: No Periwound Skin Texture Texture Color No Abnormalities Noted: No No Abnormalities Noted: No Callus: No Atrophie Blanche: No Crepitus: No Cyanosis: No Excoriation: No Ecchymosis: No Induration: Yes Erythema: Yes Rash: No Erythema Location: Circumferential Scarring: No Hemosiderin Staining: No Mottled: No Moisture Pallor: No No Abnormalities Noted: No Rubor: No Dry / Scaly: No Maceration: No Temperature / Pain Temperature: No Abnormality Tenderness on Palpation: Yes Wound Preparation Ulcer Cleansing: Rinsed/Irrigated with Saline Topical Anesthetic Applied: Other: lidocaine 4%, Treatment Notes Wound #1 (Left, Medial Lower Leg) 1. Cleansed with: Clean wound with Normal Saline 2. Anesthetic Topical Lidocaine 4% cream to wound bed prior to debridement 4. Dressing Applied: Other dressing (specify in notes)  5. Secondary Dressing Applied ABD  Pad Kerlix/Conform 7. Secured with Tape Notes [employer], ABD, comform and Event organiser) Signed: 04/21/2017 5:39:55 PM By: Teresa Krueger, BSN, RN, CWS, Kim RN, BSN Entered By: Teresa Krueger, BSN, RN, CWS, Kim on 04/21/2017 14:01:30 Teresa Krueger ([id number]) -------------------------------------------------------------------------------- [place] Details Patient Name: Teresa Krueger Date of Service: 04/21/2017 1:45 PM Medical Record Number: [id number] Patient Account Number: [id number] Date of Birth/Sex: 1934-01-27 (83 y.o. Female) Treating RN: Cornell Barman Primary Care Kenni Newton: Ramonita Lab Other Clinician: Referring Christinamarie Tall: Ramonita Lab Treating Janice Bodine/Extender: Frann Rider in Treatment: 1 Vital Signs Time Taken: 13:52 Temperature (F): 97.8 Height (in): 60 Pulse (bpm): 67 Weight (lbs): 251 Respiratory Rate (breaths/min): 16 Body Mass Index (BMI): 49 Blood Pressure (mmHg): 160/34 Reference Range: 80 - 120 mg / dl Notes MD Notified of BP. Patient instructed to follow up with heart Doctor. Electronic Signature(s) Signed: 04/21/2017 5:39:55 PM By: Teresa Krueger, BSN, RN, CWS, Kim RN, BSN Entered By: Teresa Krueger, BSN, RN, CWS, Kim on 04/21/2017 13:55:55

## 2017-04-28 ENCOUNTER — Encounter: Payer: Medicare Other | Attending: Surgery | Admitting: Surgery

## 2017-04-28 DIAGNOSIS — X58XXXA Exposure to other specified factors, initial encounter: Secondary | ICD-10-CM | POA: Diagnosis not present

## 2017-04-28 DIAGNOSIS — E11622 Type 2 diabetes mellitus with other skin ulcer: Secondary | ICD-10-CM | POA: Diagnosis present

## 2017-04-28 DIAGNOSIS — Z885 Allergy status to narcotic agent status: Secondary | ICD-10-CM | POA: Insufficient documentation

## 2017-04-28 DIAGNOSIS — J449 Chronic obstructive pulmonary disease, unspecified: Secondary | ICD-10-CM | POA: Diagnosis not present

## 2017-04-28 DIAGNOSIS — Z881 Allergy status to other antibiotic agents status: Secondary | ICD-10-CM | POA: Diagnosis not present

## 2017-04-28 DIAGNOSIS — K2270 Barrett's esophagus without dysplasia: Secondary | ICD-10-CM | POA: Insufficient documentation

## 2017-04-28 DIAGNOSIS — Z794 Long term (current) use of insulin: Secondary | ICD-10-CM | POA: Diagnosis not present

## 2017-04-28 DIAGNOSIS — G4730 Sleep apnea, unspecified: Secondary | ICD-10-CM | POA: Insufficient documentation

## 2017-04-28 DIAGNOSIS — Z87891 Personal history of nicotine dependence: Secondary | ICD-10-CM | POA: Insufficient documentation

## 2017-04-28 DIAGNOSIS — E1140 Type 2 diabetes mellitus with diabetic neuropathy, unspecified: Secondary | ICD-10-CM | POA: Insufficient documentation

## 2017-04-28 DIAGNOSIS — Z950 Presence of cardiac pacemaker: Secondary | ICD-10-CM | POA: Diagnosis not present

## 2017-04-28 DIAGNOSIS — M109 Gout, unspecified: Secondary | ICD-10-CM | POA: Diagnosis not present

## 2017-04-28 DIAGNOSIS — M70962 Unspecified soft tissue disorder related to use, overuse and pressure, left lower leg: Secondary | ICD-10-CM | POA: Insufficient documentation

## 2017-04-28 DIAGNOSIS — L97222 Non-pressure chronic ulcer of left calf with fat layer exposed: Secondary | ICD-10-CM | POA: Diagnosis not present

## 2017-04-28 DIAGNOSIS — Z6841 Body Mass Index (BMI) 40.0 and over, adult: Secondary | ICD-10-CM | POA: Diagnosis not present

## 2017-04-28 DIAGNOSIS — M1990 Unspecified osteoarthritis, unspecified site: Secondary | ICD-10-CM | POA: Diagnosis not present

## 2017-04-28 DIAGNOSIS — S81812A Laceration without foreign body, left lower leg, initial encounter: Secondary | ICD-10-CM | POA: Diagnosis not present

## 2017-04-28 DIAGNOSIS — I10 Essential (primary) hypertension: Secondary | ICD-10-CM | POA: Diagnosis not present

## 2017-04-28 DIAGNOSIS — Z993 Dependence on wheelchair: Secondary | ICD-10-CM | POA: Diagnosis not present

## 2017-04-30 NOTE — Progress Notes (Signed)
KISMET, FACEMIRE ([id number]) Visit Report for 04/28/2017 Arrival Information Details Patient Name: Teresa Krueger, Teresa Krueger Date of Service: 04/28/2017 3:30 PM Medical Record Number: [id number] Patient Account Number: [id number] Date of Birth/Sex: 04-25-1934 (83 y.o. Female) Treating RN: Roger Shelter Primary Care [place]: Ramonita Lab Other Clinician: Referring Brownie Nehme: Ramonita Lab Treating Harbert Fitterer/Extender: Frann Rider in Treatment: 2 Visit Information History Since Last Visit All ordered tests and consults were completed: No Patient Arrived: Wheel Chair Added or deleted any medications: No Arrival Time: 15:28 Any new allergies or adverse reactions: No Accompanied By: husband Had a fall or experienced change in No Transfer Assistance: None activities of daily living that may affect Patient Identification Verified: Yes risk of falls: Secondary Verification Process Yes Signs or symptoms of abuse/neglect since last visito No Completed: Hospitalized since last visit: No Patient Requires Transmission-Based No Pain Present Now: No Precautions: Patient Has Alerts: Yes Patient Alerts: Patient on Blood Thinner Type II Diabetic Plavix Electronic Signature(s) Signed: 04/29/2017 4:24:00 PM By: Roger Shelter Entered By: Roger Shelter on 04/28/2017 15:32:43 Teresa Krueger ([id number]) -------------------------------------------------------------------------------- Encounter Discharge Information Details Patient Name: Teresa Krueger Date of Service: 04/28/2017 3:30 PM Medical Record Number: [id number] Patient Account Number: [id number] Date of Birth/Sex: Sep 23, 1933 (83 y.o. Female) Treating RN: Cornell Barman Primary Care Dannica Bickham: Ramonita Lab Other Clinician: Referring Blakley Michna: Ramonita Lab Treating Cerinity Zynda/Extender: Frann Rider in Treatment: 2 Encounter Discharge Information Items Discharge Pain Level: 0 Discharge Condition: Stable Ambulatory Status:  Wheelchair Discharge Destination: Home Transportation: Private Auto Accompanied By: husband Schedule Follow-up Appointment: Yes Medication Reconciliation completed and Yes provided to Patient/Care Buel Molder: Provided on Clinical Summary of Care: 04/28/2017 Form Type Recipient Paper Patient JF Electronic Signature(s) Signed: 04/29/2017 4:24:00 PM By: Roger Shelter Entered By: Roger Shelter on 04/28/2017 16:32:30 Teresa Krueger ([id number]) -------------------------------------------------------------------------------- Lower Extremity Assessment Details Patient Name: Teresa Krueger Date of Service: 04/28/2017 3:30 PM Medical Record Number: [id number] Patient Account Number: [id number] Date of Birth/Sex: [birth year] (83 y.o. Female) Treating RN: Roger Shelter Primary Care Bernis Stecher: Ramonita Lab Other Clinician: Referring Tome Wilson: Ramonita Lab Treating Daun Rens/Extender: Frann Rider in Treatment: 2 Edema Assessment Assessed: [Left: No] [Right: No] Edema: [Left: Ye] [Right: s] Calf Left: Right: Point of Measurement: 30 cm From Medial Instep 48.8 cm cm Ankle Left: Right: Point of Measurement: 10 cm From Medial Instep 31.4 cm cm Vascular Assessment Claudication: Claudication Assessment [Left:Intermittent] Pulses: Dorsalis Pedis Palpable: [Left:Yes] Posterior Tibial Extremity colors, hair growth, and conditions: Extremity Color: [Left:Red] Hair Growth on Extremity: [Left:No] Temperature of Extremity: [Left:Warm] Capillary Refill: [Left:< 3 seconds] Toe Nail Assessment Left: Right: Thick: No Discolored: No Deformed: No Improper Length and Hygiene: No Electronic Signature(s) Signed: 04/29/2017 4:24:00 PM By: Roger Shelter Entered By: Roger Shelter on 04/28/2017 16:29:53 Teresa Krueger ([id number]) -------------------------------------------------------------------------------- Multi Wound Chart Details Patient Name: Teresa Krueger Date  of Service: 04/28/2017 3:30 PM Medical Record Number: [id number] Patient Account Number: [id number] Date of Birth/Sex: November 29, 1933 (83 y.o. Female) Treating RN: Roger Shelter Primary Care Edie Darley: Ramonita Lab Other Clinician: Referring Otisha Spickler: Ramonita Lab Treating Kamya Watling/Extender: Frann Rider in Treatment: 2 Vital Signs Height(in): 60 Pulse(bpm): 45 Weight(lbs): 251 Blood Pressure(mmHg): 165/54 Body Mass Index(BMI): 49 Temperature(F): 98.3 Respiratory Rate 16 (breaths/min): Photos: [N/A:N/A] Wound Location: Left, Medial Lower Leg N/A N/A Wounding Event: Trauma N/A N/A Primary Etiology: Trauma, Other N/A N/A Secondary Etiology: Diabetic Wound/Ulcer of the N/A N/A Lower Extremity Comorbid History: Cataracts, Chronic Obstructive N/A N/A Pulmonary Disease (COPD), Sleep Apnea, Hypertension, Type II Diabetes, Gout, Osteoarthritis, Neuropathy,  Received Chemotherapy Date Acquired: 04/03/2017 N/A N/A Weeks of Treatment: 2 N/A N/A Wound Status: Open N/A N/A Measurements L x W x D 1.2x3.8x1.6 N/A N/A (cm) Area (cm) : 3.581 N/A N/A Volume (cm) : 5.73 N/A N/A % Reduction in Area: 44.70% N/A N/A % Reduction in Volume: -784.30% N/A N/A Classification: Full Thickness Without N/A N/A Exposed Support Structures Exudate Amount: Medium N/A N/A Exudate Type: Serosanguineous N/A N/A Exudate Color: red, brown N/A N/A Wound Margin: Distinct, outline attached N/A N/A Granulation Amount: Medium (34-66%) N/A N/A Granulation Quality: Red N/A N/A Necrotic Amount: Medium (34-66%) N/A N/A Exposed Structures: Fat Layer (Subcutaneous N/A N/A Tissue) Exposed: Yes Teresa Krueger, Teresa Krueger ([id number]) Fascia: No Tendon: No Muscle: No Joint: No Bone: No Epithelialization: None N/A N/A Debridement: Debridement ([id number]) N/A N/A Pre-procedure 04:02 N/A N/A Verification/Time Out Taken: Pain Control: Other N/A N/A Tissue Debrided: Necrotic/Eschar, N/A N/A Fibrin/Slough,  Subcutaneous Level: Skin/Subcutaneous Tissue N/A N/A Debridement Area (sq cm): 4.56 N/A N/A Instrument: Curette N/A N/A Bleeding: Moderate N/A N/A Hemostasis Achieved: Pressure N/A N/A Procedural Pain: 0 N/A N/A Post Procedural Pain: 0 N/A N/A Debridement Treatment Procedure was tolerated well N/A N/A Response: Post Debridement 1.2x3.8x0.6 N/A N/A Measurements L x W x D (cm) Post Debridement Volume: 2.149 N/A N/A (cm) Periwound Skin Texture: Excoriation: No N/A N/A Induration: No Callus: No Crepitus: No Rash: No Scarring: No Periwound Skin Moisture: Maceration: No N/A N/A Dry/Scaly: No Periwound Skin Color: Atrophie Blanche: No N/A N/A Cyanosis: No Ecchymosis: No Erythema: No Hemosiderin Staining: No Mottled: No Pallor: No Rubor: No Temperature: No Abnormality N/A N/A Tenderness on Palpation: Yes N/A N/A Wound Preparation: Ulcer Cleansing: N/A N/A Rinsed/Irrigated with Saline Topical Anesthetic Applied: Other: lidocaine 4% Procedures Performed: Debridement N/A N/A Negative Pressure Wound Therapy Application (NPWT) Treatment Notes Electronic Signature(s) Signed: 04/28/2017 4:21:07 PM By: Christin Fudge MD, FACS Teresa Krueger ([id number]) Entered By: Christin Fudge on 04/28/2017 16:21:06 Teresa Krueger ([id number]) -------------------------------------------------------------------------------- [place] Details Patient Name: Teresa Krueger Date of Service: 04/28/2017 3:30 PM Medical Record Number: [id number] Patient Account Number: [id number] Date of Birth/Sex: 01/13/1934 (83 y.o. Female) Treating RN: Roger Shelter Primary Care Brodi Kari: Ramonita Lab Other Clinician: Referring Breanna Shorkey: Ramonita Lab Treating Ashaz Robling/Extender: Frann Rider in Treatment: 2 Active Inactive ` Necrotic Tissue Nursing Diagnoses: Impaired tissue integrity related to necrotic/devitalized tissue Knowledge deficit related to management of  necrotic/devitalized tissue Goals: Necrotic/devitalized tissue will be minimized in the wound bed Date Initiated: 04/14/2017 Target Resolution Date: 04/28/2017 Goal Status: Active Interventions: Assess patient pain level pre-, during and post procedure and prior to discharge Provide education on necrotic tissue and debridement process Treatment Activities: Apply topical anesthetic as ordered : 04/14/2017 Notes: ` Orientation to the Wound Care Program Nursing Diagnoses: Knowledge deficit related to the wound healing center program Goals: Patient/caregiver will verbalize understanding of the [place] Date Initiated: 04/14/2017 Target Resolution Date: 04/21/2017 Goal Status: Active Interventions: Provide education on orientation to the wound center Notes: ` Wound/Skin Impairment Nursing Diagnoses: Impaired tissue integrity GoalsADILYN, Teresa Krueger ([id number]) Ulcer/skin breakdown will heal within 14 weeks Date Initiated: 04/14/2017 Target Resolution Date: 08/04/2017 Goal Status: Active Interventions: Assess ulceration(s) every visit Treatment Activities: Referred to DME Taran Haynesworth for dressing supplies : 04/14/2017 Notes: Electronic Signature(s) Signed: 04/29/2017 4:24:00 PM By: Roger Shelter Entered By: Roger Shelter on 04/28/2017 15:41:41 Teresa Krueger ([id number]) -------------------------------------------------------------------------------- Negative Pressure Wound Therapy Application (NPWT) Details Patient Name: Teresa Krueger Date of Service: 04/28/2017 3:30 PM Medical Record  Number: [id number] Patient Account Number: [id number] Date of Birth/Sex: [birth year] (83 y.o. Female) Treating RN: Roger Shelter Primary Care Jaleil Renwick: Ramonita Lab Other Clinician: Referring Shelita Steptoe: Ramonita Lab Treating Viveca Beckstrom/Extender: Frann Rider in Treatment: 2 NPWT Application Performed for: Wound #1 Left,Medial Lower Leg Performed By:  Roger Shelter, RN Type: Other Coverage Size (sq cm): 4.56 Pressure Type: Constant Pressure Setting: 125 mmHG Drain Type: None Quantity of Sponges/Gauze Inserted: 1 Sponge/Dressing Type: Combination Combination: blue gauze Date Initiated: 04/28/2017 Response to Treatment: patient tolerated well Post Procedure Diagnosis Same as Pre-procedure Electronic Signature(s) Signed: 04/29/2017 4:24:00 PM By: Roger Shelter Entered By: Roger Shelter on 04/28/2017 16:16:21 Teresa Krueger ([id number]) -------------------------------------------------------------------------------- Pain Assessment Details Patient Name: Teresa Krueger Date of Service: 04/28/2017 3:30 PM Medical Record Number: [id number] Patient Account Number: [id number] Date of Birth/Sex: Aug 07, 1933 (83 y.o. Female) Treating RN: Roger Shelter Primary Care Millena Callins: Ramonita Lab Other Clinician: Referring Leslee Suire: Ramonita Lab Treating Trevor Duty/Extender: Frann Rider in Treatment: 2 Active Problems Location of Pain Severity and Description of Pain Patient Has Paino No Site Locations Pain Management and Medication Current Pain Management: Electronic Signature(s) Signed: 04/29/2017 4:24:00 PM By: Roger Shelter Entered By: Roger Shelter on 04/28/2017 15:33:00 Teresa Krueger ([id number]) -------------------------------------------------------------------------------- Patient/Caregiver Education Details Patient Name: Teresa Krueger Date of Service: 04/28/2017 3:30 PM Medical Record Number: [id number] Patient Account Number: [id number] Date of Birth/Gender: 1933/09/04 (83 y.o. Female) Treating RN: Roger Shelter Primary Care Physician: Ramonita Lab Other Clinician: Referring Physician: Ramonita Lab Treating Physician/Extender: Frann Rider in Treatment: 2 Education Assessment Education Provided To: Patient and Caregiver Education Topics Provided Wound Debridement: Handouts: Wound  Debridement Methods: Explain/Verbal Responses: State content correctly Wound/Skin Impairment: Handouts: Other: snap vac Methods: Demonstration, Explain/Verbal Responses: State content correctly Electronic Signature(s) Signed: 04/29/2017 4:24:00 PM By: Roger Shelter Entered By: Roger Shelter on 04/28/2017 16:33:12 Teresa Krueger ([id number]) -------------------------------------------------------------------------------- Wound Assessment Details Patient Name: Teresa Krueger Date of Service: 04/28/2017 3:30 PM Medical Record Number: [id number] Patient Account Number: [id number] Date of Birth/Sex: 12-16-1933 (83 y.o. Female) Treating RN: Roger Shelter Primary Care Brendan Gadson: Ramonita Lab Other Clinician: Referring Kapil Petropoulos: Ramonita Lab Treating Rondell Pardon/Extender: Frann Rider in Treatment: 2 Wound Status Wound Number: 1 Primary Trauma, Other Etiology: Wound Location: Left Lower Leg - Medial Secondary Diabetic Wound/Ulcer of the Lower Extremity Wounding Event: Trauma Etiology: Date Acquired: 04/03/2017 Wound Open Weeks Of Treatment: 2 Status: Clustered Wound: No Comorbid Cataracts, Chronic Obstructive Pulmonary History: Disease (COPD), Sleep Apnea, Hypertension, Type II Diabetes, Gout, Osteoarthritis, Neuropathy, Received Chemotherapy Photos Wound Measurements Length: (cm) 1.2 Width: (cm) 3.8 Depth: (cm) 1.6 Area: (cm) 3.581 Volume: (cm) 5.73 % Reduction in Area: 44.7% % Reduction in Volume: -784.3% Epithelialization: None Tunneling: No Undermining: No Wound Description Full Thickness Without Exposed Support Foul Odo Classification: Structures Slough/F Wound Margin: Distinct, outline attached Exudate Medium Amount: Exudate Type: Serosanguineous Exudate Color: red, brown r After Cleansing: No ibrino Yes Wound Bed Granulation Amount: Medium (34-66%) Exposed Structure Granulation Quality: Red Fascia Exposed: No Necrotic Amount: Medium  (34-66%) Fat Layer (Subcutaneous Tissue) Exposed: Yes Necrotic Quality: Eschar, Adherent Slough Tendon Exposed: No Muscle Exposed: No Joint Exposed: No Bone Exposed: No Teresa Krueger, Teresa G. ([id number]) Periwound Skin Texture Texture Color No Abnormalities Noted: No No Abnormalities Noted: No Callus: No Atrophie Blanche: No Crepitus: No Cyanosis: No Excoriation: No Ecchymosis: No Induration: No Erythema: No Rash: No Hemosiderin Staining: No Scarring: No Mottled: No Pallor: No Moisture Rubor: No No Abnormalities Noted: No Dry / Scaly: No Temperature / Pain Maceration:  No Temperature: No Abnormality Tenderness on Palpation: Yes Wound Preparation Ulcer Cleansing: Rinsed/Irrigated with Saline Topical Anesthetic Applied: Other: lidocaine 4%, Treatment Notes Wound #1 (Left, Medial Lower Leg) 1. Cleansed with: Clean wound with Normal Saline 2. Anesthetic Topical Lidocaine 4% cream to wound bed prior to debridement 8. Negative Pressure Wound Therapy Wound Vac intermittently at 175mm/hg pressure Other (specify in notes) Notes snap vac Electronic Signature(s) Signed: 04/29/2017 4:24:00 PM By: Roger Shelter Entered By: Roger Shelter on 04/28/2017 16:27:51 Teresa Krueger ([id number]) -------------------------------------------------------------------------------- [place] Details Patient Name: Teresa Krueger Date of Service: 04/28/2017 3:30 PM Medical Record Number: [id number] Patient Account Number: [id number] Date of Birth/Sex: Nov 17, 1933 (83 y.o. Female) Treating RN: Roger Shelter Primary Care Alphons Burgert: Ramonita Lab Other Clinician: Referring Erie Sica: Ramonita Lab Treating Lysander Calixte/Extender: Frann Rider in Treatment: 2 Vital Signs Time Taken: 03:33 Temperature (F): 98.3 Height (in): 60 Pulse (bpm): 84 Weight (lbs): 251 Respiratory Rate (breaths/min): 16 Body Mass Index (BMI): 49 Blood Pressure (mmHg): 165/54 Reference Range: 80 - 120 mg /  dl Electronic Signature(s) Signed: 04/29/2017 4:24:00 PM By: Roger Shelter Entered By: Roger Shelter on 04/28/2017 15:34:14

## 2017-04-30 NOTE — Progress Notes (Signed)
Teresa Krueger ([id number]) Visit Report for 04/28/2017 Chief Complaint Document Details Patient Name: Teresa Krueger, Teresa Krueger Date of Service: 04/28/2017 3:30 PM Medical Record Number: [id number] Patient Account Number: [id number] Date of Birth/Sex: May 19, 1934 (83 y.o. Female) Treating RN: Cornell Barman Primary Care Provider: Ramonita Lab Other Clinician: Referring Provider: Ramonita Lab Treating Provider/Extender: Frann Rider in Treatment: 2 Information Obtained from: Patient Chief Complaint Patients presents for treatment of an open diabetic ulcer to the left lower extremity caused by trauma 2 weeks ago Electronic Signature(s) Signed: 04/28/2017 4:21:43 PM By: Christin Fudge MD, FACS Entered By: Christin Fudge on 04/28/2017 16:21:42 Teresa Krueger ([id number]) -------------------------------------------------------------------------------- Debridement Details Patient Name: Teresa Krueger Date of Service: 04/28/2017 3:30 PM Medical Record Number: [id number] Patient Account Number: [id number] Date of Birth/Sex: 1933-12-30 (83 y.o. Female) Treating RN: Cornell Barman Primary Care Provider: Ramonita Lab Other Clinician: Referring Provider: Ramonita Lab Treating Provider/Extender: Frann Rider in Treatment: 2 Debridement Performed for Wound #1 Left,Medial Lower Leg Assessment: Performed By: Physician Christin Fudge, MD Debridement: Debridement Severity of Tissue Pre Fat layer exposed Debridement: Pre-procedure Verification/Time Yes - 04:02 Out Taken: Start Time: 04:03 Pain Control: Other : lidocaine 4 % Level: Skin/Subcutaneous Tissue Total Area Debrided (L x W): 1.2 (cm) x 3.8 (cm) = 4.56 (cm) Tissue and other material Viable, Non-Viable, Eschar, Fibrin/Slough, Subcutaneous debrided: Instrument: Curette Bleeding: Moderate Hemostasis Achieved: Pressure End Time: 04:03 Procedural Pain: 0 Post Procedural Pain: 0 Response to Treatment: Procedure was tolerated  well Post Debridement Measurements of Total Wound Length: (cm) 1.2 Width: (cm) 3.8 Depth: (cm) 0.6 Volume: (cm) 2.149 Character of Wound/Ulcer Post Debridement: Stable Severity of Tissue Post Debridement: Fat layer exposed Post Procedure Diagnosis Same as Pre-procedure Notes after sharp debridement of her wound and noting the depth in the central area I believe she will benefit from a snap vacuum Electronic Signature(s) Signed: 04/28/2017 4:21:35 PM By: Christin Fudge MD, FACS Signed: 04/28/2017 4:55:21 PM By: Gretta Cool, BSN, RN, CWS, Kim RN, BSN Entered By: Christin Fudge on 04/28/2017 16:21:35 Teresa Krueger ([id number]) -------------------------------------------------------------------------------- HPI Details Patient Name: Teresa Krueger Date of Service: 04/28/2017 3:30 PM Medical Record Number: [id number] Patient Account Number: [id number] Date of Birth/Sex: April 16, 1934 (83 y.o. Female) Treating RN: Cornell Barman Primary Care Provider: Ramonita Lab Other Clinician: Referring Provider: Ramonita Lab Treating Provider/Extender: Frann Rider in Treatment: 2 History of Present Illness Location: left lower extremity Quality: Patient reports experiencing a sharp pain to affected area(s). Severity: Patient states wound are getting worse. Duration: Patient has had the wound for < 2 weeks prior to presenting for treatment Timing: Pain in wound is Intermittent (comes and goes Context: The wound occurred when the patient had a blunt injury to the left lower extremity which caused a large hematoma Modifying Factors: Other treatment(s) tried include:has been put on oral antibiotics by her PCP Associated Signs and Symptoms: Patient reports having increase swelling. HPI Description: 83 year old patient, known to be a diabetic was recently seen by her PCP regarding an injury to the left lower extremity which she has had about 2 weeks. She injured it using an electric wheelchair and hitting  the ramp on her [place] and had an x-ray in the ER which was negative for any fracture. The patient however says she has not had any x-ray done in the ER. I did not find any x-ray reported in her electronic medical record. She has minimal drainage from the wound and it was covered with eschar. Past medical history significant for breast cancer,  COPD, diabetes mellitus type 2, essential hypertension, morbid obesity, status post cardiac pacemaker, cholecystectomy, mastectomy, EGD for Barrett's esophagus. Patient was given doxycycline for 10 days and referred to the wound center. The patient has had a bilateral lower extremity venous reflux examination done in February 2018 and it was showing no venous incompetence bilaterally. 04/21/2017 -- after review of her wound today I have recommended a snap vacuum system and we'll try and get this authorized by her insurance Electronic Signature(s) Signed: 04/28/2017 4:21:53 PM By: Christin Fudge MD, FACS Entered By: Christin Fudge on 04/28/2017 16:21:50 Teresa Krueger ([id number]) -------------------------------------------------------------------------------- Physical Exam Details Patient Name: Teresa Krueger Date of Service: 04/28/2017 3:30 PM Medical Record Number: [id number] Patient Account Number: [id number] Date of Birth/Sex: 1933/12/24 (83 y.o. Female) Treating RN: Cornell Barman Primary Care Provider: Ramonita Lab Other Clinician: Referring Provider: Ramonita Lab Treating Provider/Extender: Frann Rider in Treatment: 2 Constitutional . Pulse regular. Respirations normal and unlabored. Afebrile. . Eyes Nonicteric. Reactive to light. Ears, Nose, Mouth, and Throat Lips, teeth, and gums WNL.Marland Kitchen Moist mucosa without lesions. Neck supple and nontender. No palpable supraclavicular or cervical adenopathy. Normal sized without goiter. Respiratory WNL. No retractions.. Cardiovascular Pedal Pulses WNL. No clubbing, cyanosis or  edema. Lymphatic No adneopathy. No adenopathy. No adenopathy. Musculoskeletal Adexa without tenderness or enlargement.. Digits and nails w/o clubbing, cyanosis, infection, petechiae, ischemia, or inflammatory conditions.. Integumentary (Hair, Skin) No suspicious lesions. No crepitus or fluctuance. No peri-wound warmth or erythema. No masses.Marland Kitchen Psychiatric Judgement and insight Intact.. No evidence of depression, anxiety, or agitation.. Notes sharp debridement was done with a #3 curet and the central area of the wound has still a significant amount of depth and after review and discussion with the patient she is agreeable to use a snap vacuum over this Electronic Signature(s) Signed: 04/28/2017 4:22:41 PM By: Christin Fudge MD, FACS Entered By: Christin Fudge on 04/28/2017 16:22:40 Teresa Krueger ([id number]) -------------------------------------------------------------------------------- Physician Orders Details Patient Name: Teresa Krueger Date of Service: 04/28/2017 3:30 PM Medical Record Number: [id number] Patient Account Number: [id number] Date of Birth/Sex: 1934-04-24 (83 y.o. Female) Treating RN: Roger Shelter Primary Care Provider: Ramonita Lab Other Clinician: Referring Provider: Ramonita Lab Treating Provider/Extender: Frann Rider in Treatment: 2 Verbal / Phone Orders: No Diagnosis Coding Wound Cleansing Wound #1 Left,Medial Lower Leg o Clean wound with Normal Saline. o May Shower, gently pat wound dry prior to applying new dressing. Anesthetic Wound #1 Left,Medial Lower Leg o Topical Lidocaine 4% cream applied to wound bed prior to debridement Dressing Change Frequency Wound #1 Left,Medial Lower Leg o Change dressing every week Follow-up Appointments Wound #1 Left,Medial Lower Leg o Return Appointment in 1 week. o Nurse Visit as needed Edema Control Wound #1 Left,Medial Lower Leg o Elevate legs to the level of the heart and pump ankles as  often as possible Negative Pressure Wound Therapy Wound #1 Left,Medial Lower Leg o Other: - snap vac Electronic Signature(s) Signed: 04/28/2017 4:32:35 PM By: Christin Fudge MD, FACS Signed: 04/29/2017 4:24:00 PM By: Roger Shelter Entered By: Roger Shelter on 04/28/2017 16:12:45 Teresa Krueger ([id number]) -------------------------------------------------------------------------------- Problem List Details Patient Name: Teresa Krueger Date of Service: 04/28/2017 3:30 PM Medical Record Number: [id number] Patient Account Number: [id number] Date of Birth/Sex: [birth year] (83 y.o. Female) Treating RN: Cornell Barman Primary Care Provider: Ramonita Lab Other Clinician: Referring Provider: Ramonita Lab Treating Provider/Extender: Frann Rider in Treatment: 2 Active Problems ICD-10 Encounter Code Description Active Date Diagnosis E11.622 Type 2 diabetes mellitus with other skin  ulcer 04/14/2017 Yes L97.222 Non-pressure chronic ulcer of left calf with fat layer exposed 04/14/2017 Yes S81.812A Laceration without foreign body, left lower leg, initial encounter 04/14/2017 Yes M70.962 Unspecified soft tissue disorder related to use, overuse and 04/14/2017 Yes pressure, left lower leg E66.01 Morbid (severe) obesity due to excess calories 04/14/2017 Yes Z99.3 Dependence on wheelchair 04/14/2017 Yes Inactive Problems Resolved Problems Electronic Signature(s) Signed: 04/28/2017 4:21:00 PM By: Christin Fudge MD, FACS Entered By: Christin Fudge on 04/28/2017 16:21:00 Teresa Krueger ([id number]) -------------------------------------------------------------------------------- Progress Note Details Patient Name: Teresa Krueger Date of Service: 04/28/2017 3:30 PM Medical Record Number: [id number] Patient Account Number: [id number] Date of Birth/Sex: [birth year] (83 y.o. Female) Treating RN: Cornell Barman Primary Care Provider: Ramonita Lab Other Clinician: Referring Provider: Ramonita Lab Treating Provider/Extender: Frann Rider in Treatment: 2 Subjective Chief Complaint Information obtained from Patient Patients presents for treatment of an open diabetic ulcer to the left lower extremity caused by trauma 2 weeks ago History of Present Illness (HPI) The following HPI elements were documented for the patient's wound: Location: left lower extremity Quality: Patient reports experiencing a sharp pain to affected area(s). Severity: Patient states wound are getting worse. Duration: Patient has had the wound for < 2 weeks prior to presenting for treatment Timing: Pain in wound is Intermittent (comes and goes Context: The wound occurred when the patient had a blunt injury to the left lower extremity which caused a large hematoma Modifying Factors: Other treatment(s) tried include:has been put on oral antibiotics by her PCP Associated Signs and Symptoms: Patient reports having increase swelling. 83 year old patient, known to be a diabetic was recently seen by her PCP regarding an injury to the left lower extremity which she has had about 2 weeks. She injured it using an electric wheelchair and hitting the ramp on her [place] and had an x-ray in the ER which was negative for any fracture. The patient however says she has not had any x-ray done in the ER. I did not find any x-ray reported in her electronic medical record. She has minimal drainage from the wound and it was covered with eschar. Past medical history significant for breast cancer, COPD, diabetes mellitus type 2, essential hypertension, morbid obesity, status post cardiac pacemaker, cholecystectomy, mastectomy, EGD for Barrett's esophagus. Patient was given doxycycline for 10 days and referred to the wound center. The patient has had a bilateral lower extremity venous reflux examination done in February 2018 and it was showing no venous incompetence bilaterally. 04/21/2017 -- after review of her wound today I have  recommended a snap vacuum system and we'll try and get this authorized by her insurance Patient History Information obtained from Patient. Family History Diabetes - Paternal Grandparents, Heart Disease - [place] Grandparents, Hypertension - Mother, No family history of Cancer, Kidney Disease, Lung Disease, Seizures, Stroke, Thyroid Problems, Tuberculosis. Social History Former smoker - Quit 1994, Marital Status - Married, Alcohol Use - Never, Drug Use - No History, Caffeine Use - Never. Medical And Surgical History Notes Oncologic Breast Cancer 1999 TYANN, NIEHAUS ([id number]) Objective Constitutional Pulse regular. Respirations normal and unlabored. Afebrile. Vitals Time Taken: 3:33 AM, Height: 60 in, Weight: 251 lbs, BMI: 49, Temperature: 98.3 F, Pulse: 84 bpm, Respiratory Rate: 16 breaths/min, Blood Pressure: 165/54 mmHg. Eyes Nonicteric. Reactive to light. Ears, Nose, Mouth, and Throat Lips, teeth, and gums WNL.Marland Kitchen Moist mucosa without lesions. Neck supple and nontender. No palpable supraclavicular or cervical adenopathy. Normal sized without goiter. Respiratory WNL. No retractions.. Cardiovascular Pedal  Pulses WNL. No clubbing, cyanosis or edema. Lymphatic No adneopathy. No adenopathy. No adenopathy. Musculoskeletal Adexa without tenderness or enlargement.. Digits and nails w/o clubbing, cyanosis, infection, petechiae, ischemia, or inflammatory conditions.Marland Kitchen Psychiatric Judgement and insight Intact.. No evidence of depression, anxiety, or agitation.. General Notes: sharp debridement was done with a #3 curet and the central area of the wound has still a significant amount of depth and after review and discussion with the patient she is agreeable to use a snap vacuum over this Integumentary (Hair, Skin) No suspicious lesions. No crepitus or fluctuance. No peri-wound warmth or erythema. No masses.. Wound #1 status is Open. Original  cause of wound was Trauma. The wound is located on the Left,Medial Lower Leg. The wound measures 1.2cm length x 3.8cm width x 1.6cm depth; 3.581cm^2 area and 5.73cm^3 volume. There is Fat Layer (Subcutaneous Tissue) Exposed exposed. There is no tunneling or undermining noted. There is a medium amount of serosanguineous drainage noted. The wound margin is distinct with the outline attached to the wound base. There is medium (34-66%) red granulation within the wound bed. There is a medium (34-66%) amount of necrotic tissue within the wound bed including Eschar and Adherent Slough. The periwound skin appearance did not exhibit: Callus, Crepitus, Excoriation, Induration, Rash, Scarring, Dry/Scaly, Maceration, Atrophie Blanche, Cyanosis, Ecchymosis, Hemosiderin Staining, Mottled, Pallor, Rubor, Erythema. Periwound temperature was noted as No Abnormality. The periwound has tenderness on palpation. ZARIAH, CAVENDISH ([id number]) Assessment Active Problems ICD-10 E11.622 - Type 2 diabetes mellitus with other skin ulcer L97.222 - Non-pressure chronic ulcer of left calf with fat layer exposed O24.235T - Laceration without foreign body, left lower leg, initial encounter M70.962 - Unspecified soft tissue disorder related to use, overuse and pressure, left lower leg E66.01 - Morbid (severe) obesity due to excess calories Z99.3 - Dependence on wheelchair Procedures Wound #1 Pre-procedure diagnosis of Wound #1 is a Trauma, Other located on the Left,Medial Lower Leg .Severity of Tissue Pre Debridement is: Fat layer exposed. There was a Skin/Subcutaneous Tissue Debridement ([id number]) debridement with total area of 4.56 sq cm performed by Christin Fudge, MD. with the following instrument(s): Curette to remove Viable and Non- Viable tissue/material including Fibrin/Slough, Eschar, and Subcutaneous after achieving pain control using Other (lidocaine 4 %). A time out was conducted at 04:02, prior to the start  of the procedure. A Moderate amount of bleeding was controlled with Pressure. The procedure was tolerated well with a pain level of 0 throughout and a pain level of 0 following the procedure. Post Debridement Measurements: 1.2cm length x 3.8cm width x 0.6cm depth; 2.149cm^3 volume. Character of Wound/Ulcer Post Debridement is stable. Severity of Tissue Post Debridement is: Fat layer exposed. Post procedure Diagnosis Wound #1: Same as Pre-Procedure General Notes: after sharp debridement of her wound and noting the depth in the central area I believe she will benefit from a snap vacuum. Plan Wound Cleansing: Wound #1 Left,Medial Lower Leg: Clean wound with Normal Saline. May Shower, gently pat wound dry prior to applying new dressing. Anesthetic: Wound #1 Left,Medial Lower Leg: Topical Lidocaine 4% cream applied to wound bed prior to debridement Dressing Change Frequency: Wound #1 Left,Medial Lower Leg: Change dressing every week Follow-up Appointments: Wound #1 Left,Medial Lower Leg: Return Appointment in 1 week. Nurse Visit as needed Edema Control: Wound #1 Left,Medial Lower Leg: Elevate legs to the level of the heart and pump ankles as often as possible Negative Pressure Wound Therapy: CHARONDA, HEFTER ([id number]) Wound #1 Left,Medial Lower Leg: Other: - snap  vac after sharp debridement and review today, I have recommended: 1. application of the snap vacuum system locally 2. Elevation and exercise 3. Adequate protein, vitamin A, vitamin C and zinc 4. if for some reason she is not comfortable with the vacuum system and she can remove it and go back to her original dressing She and her husband have had all questions answered to their satisfaction. Electronic Signature(s) Signed: 04/28/2017 4:31:08 PM By: Christin Fudge MD, FACS Previous Signature: 04/28/2017 4:23:45 PM Version By: Christin Fudge MD, FACS Entered By: Christin Fudge on 04/28/2017 16:31:08 Teresa Krueger  ([id number]) -------------------------------------------------------------------------------- ROS/PFSH Details Patient Name: Teresa Krueger Date of Service: 04/28/2017 3:30 PM Medical Record Number: [id number] Patient Account Number: [id number] Date of Birth/Sex: 1934-01-05 (83 y.o. Female) Treating RN: Cornell Barman Primary Care Provider: Ramonita Lab Other Clinician: Referring Provider: Ramonita Lab Treating Provider/Extender: Frann Rider in Treatment: 2 Information Obtained From Patient Wound History Do you currently have one or more open woundso Yes How many open wounds do you currently haveo 1 Approximately how long have you had your woundso 2 weeks How have you been treating your wound(s) until nowo vasaline Has your wound(s) ever healed and then re-openedo No Have you had any lab work done in the past montho No Have you tested positive for an antibiotic resistant organism (MRSA, VRE)o No Have you tested positive for osteomyelitis (bone infection)o No Have you had any tests for circulation on your legso No Eyes Medical History: Positive for: Cataracts - Removed Negative for: Glaucoma; Optic Neuritis Ear/Nose/Mouth/Throat Medical History: Negative for: Chronic sinus problems/congestion; Middle ear problems Hematologic/Lymphatic Medical History: Negative for: Anemia; Hemophilia; Human Immunodeficiency Virus; Lymphedema; Sickle Cell Disease Respiratory Medical History: Positive for: Chronic Obstructive Pulmonary Disease (COPD); Sleep Apnea Negative for: Aspiration; Asthma; Pneumothorax; Tuberculosis Cardiovascular Medical History: Positive for: Hypertension Negative for: Angina; Arrhythmia; Congestive Heart Failure; Coronary Artery Disease; Deep Vein Thrombosis; Hypotension; Myocardial Infarction; Peripheral Arterial Disease; Peripheral Venous Disease; Phlebitis; Vasculitis Gastrointestinal Medical History: Negative for: Cirrhosis ; Colitis; Crohnos; Hepatitis A;  Hepatitis B; Hepatitis C Endocrine ALYZE, LAUF ([id number]) Medical History: Positive for: Type II Diabetes Negative for: Type I Diabetes Time with diabetes: 20 plus years Treated with: Insulin Blood sugar tested every day: Yes Tested : 3 Blood sugar testing results: Breakfast: 92; Bedtime: 120 Genitourinary Medical History: Negative for: End Stage Renal Disease Immunological Medical History: Negative for: Lupus Erythematosus; Raynaudos; Scleroderma Integumentary (Skin) Medical History: Negative for: History of Burn; History of pressure wounds Musculoskeletal Medical History: Positive for: Gout; Osteoarthritis Negative for: Rheumatoid Arthritis; Osteomyelitis Neurologic Medical History: Positive for: Neuropathy - Hands and feet Negative for: Dementia; Quadriplegia; Paraplegia; Seizure Disorder Oncologic Medical History: Positive for: Received Chemotherapy Negative for: Received Radiation Past Medical History Notes: Breast Cancer 1999 HBO Extended History Items Eyes: Cataracts Immunizations Pneumococcal Vaccine: Received Pneumococcal Vaccination: Yes Implantable Devices Family and Social History Cancer: No; Diabetes: Yes - Paternal Grandparents; Heart Disease: Yes - Mother,Father,Maternal Grandparents,Paternal Grandparents; Hypertension: Yes - Mother; Kidney Disease: No; Lung Disease: No; Seizures: No; Stroke: No; Thyroid Problems: No; Tuberculosis: No; Former smoker - Quit 1994; Marital Status - Married; Alcohol Use: Never; Drug Use: No KAYA, POTTENGER. ([id number]) History; Caffeine Use: Never; Advanced Directives: Yes (Not Provided); Patient does not want information on Advanced Directives; Living Will: Yes (Not Provided); Medical Power of Attorney: Yes (Not Provided) Physician Affirmation I have reviewed and agree with the above information. Electronic Signature(s) Signed: 04/28/2017 4:32:35 PM By: Christin Fudge MD, FACS Signed: 04/28/2017 4:55:21  PM By:  Gretta Cool, BSN, RN, CWS, Kim RN, BSN Entered By: Christin Fudge on 04/28/2017 16:22:06 Teresa Krueger ([id number]) -------------------------------------------------------------------------------- [place] Details Patient Name: Teresa Krueger Date of Service: 04/28/2017 Medical Record Number: [id number] Patient Account Number: [id number] Date of Birth/Sex: 03/27/1934 (83 y.o. Female) Treating RN: Cornell Barman Primary Care Provider: Ramonita Lab Other Clinician: Referring Provider: Ramonita Lab Treating Provider/Extender: Frann Rider in Treatment: 2 Diagnosis Coding ICD-10 Codes Code Description E11.622 Type 2 diabetes mellitus with other skin ulcer L97.222 Non-pressure chronic ulcer of left calf with fat layer exposed S81.812A Laceration without foreign body, left lower leg, initial encounter M70.962 Unspecified soft tissue disorder related to use, overuse and pressure, left lower leg E66.01 Morbid (severe) obesity due to excess calories Z99.3 Dependence on wheelchair Facility Procedures CPT4 Code Description: 41660630 11042 - DEB SUBQ TISSUE 20 SQ CM/< ICD-10 Diagnosis Description E11.622 Type 2 diabetes mellitus with other skin ulcer L97.222 Non-pressure chronic ulcer of left calf with fat layer exposed S81.812A Laceration without  foreign body, left lower leg, initial encoun M70.962 Unspecified soft tissue disorder related to use, overuse and pr Modifier: ter essure, left lower Quantity: 1 leg CPT4 Code Description: [id number] 97607 NEG PRESS WND TX <=50 SQ CM Modifier: Quantity: 1 Physician Procedures CPT4 Code Description: 3557322 11042 - WC PHYS SUBQ TISS 20 SQ CM ICD-10 Diagnosis Description E11.622 Type 2 diabetes mellitus with other skin ulcer L97.222 Non-pressure chronic ulcer of left calf with fat layer exposed S81.812A Laceration without  foreign body, left lower leg, initial encoun M70.962 Unspecified soft tissue disorder related to use, overuse and pr Modifier: ter  essure, left lower Quantity: 1 leg Electronic Signature(s) Signed: 04/28/2017 4:51:08 PM By: Gretta Cool, BSN, RN, CWS, Kim RN, BSN Previous Signature: 04/28/2017 4:24:06 PM Version By: Christin Fudge MD, FACS Entered By: Gretta Cool, BSN, RN, CWS, Kim on 04/28/2017 16:51:08

## 2017-05-05 ENCOUNTER — Encounter: Payer: Medicare Other | Admitting: Surgery

## 2017-05-05 DIAGNOSIS — E11622 Type 2 diabetes mellitus with other skin ulcer: Secondary | ICD-10-CM | POA: Diagnosis not present

## 2017-05-06 NOTE — Progress Notes (Signed)
DERIONNA, SALVADOR ([id number]) Visit Report for 05/05/2017 Arrival Information Details Patient Name: Teresa Krueger, Teresa Krueger Date of Service: 05/05/2017 3:30 PM Medical Record Number: [id number] Patient Account Number: [id number] Date of Birth/Sex: 1933-10-11 (83 y.o. Female) Treating RN: Cornell Barman Primary Care Kysa Calais: Ramonita Lab Other Clinician: Referring Delissa Silba: Ramonita Lab Treating Marilla Boddy/Extender: Frann Rider in Treatment: 3 Visit Information History Since Last Visit Added or deleted any medications: No Patient Arrived: Wheel Chair Any new allergies or adverse reactions: No Arrival Time: 15:42 Had a fall or experienced change in No Accompanied By: husband activities of daily living that may affect Transfer Assistance: Manual risk of falls: Patient Identification Verified: Yes Signs or symptoms of abuse/neglect since last visito No Secondary Verification Process Yes Hospitalized since last visit: No Completed: Has Dressing in Place as Prescribed: No Patient Requires Transmission-Based No Pain Present Now: No Precautions: Patient Has Alerts: Yes Patient Alerts: Patient on Blood Thinner Type II Diabetic Plavix Notes Patient removed snap vac on 03/02/2017 at midnight due to canister filling up. Replaced with aquacel and cover dressing. Electronic Signature(s) Signed: 05/05/2017 5:45:37 PM By: Gretta Cool, BSN, RN, CWS, Kim RN, BSN Entered By: Gretta Cool, BSN, RN, CWS, Kim on 05/05/2017 16:00:27 Teresa Krueger ([id number]) -------------------------------------------------------------------------------- Clinic Level of Care Assessment Details Patient Name: Teresa Krueger Date of Service: 05/05/2017 3:30 PM Medical Record Number: [id number] Patient Account Number: [id number] Date of Birth/Sex: [birth year] (83 y.o. Female) Treating RN: Cornell Barman Primary Care Sarkis Rhines: Ramonita Lab Other Clinician: Referring Margert Edsall: Ramonita Lab Treating Kenlee Maler/Extender: Frann Rider in Treatment: 3 Clinic Level of Care Assessment Items TOOL 4 Quantity Score []  - Use when only an EandM is performed on FOLLOW-UP visit 0 ASSESSMENTS - Nursing Assessment / Reassessment []  - Reassessment of Co-morbidities (includes updates in patient status) 0 X- 1 5 Reassessment of Adherence to Treatment Plan ASSESSMENTS - Wound and Skin Assessment / Reassessment X - Simple Wound Assessment / Reassessment - one wound 1 5 []  - 0 Complex Wound Assessment / Reassessment - multiple wounds []  - 0 Dermatologic / Skin Assessment (not related to wound area) ASSESSMENTS - Focused Assessment []  - Circumferential Edema Measurements - multi extremities 0 []  - 0 Nutritional Assessment / Counseling / Intervention []  - 0 Lower Extremity Assessment (monofilament, tuning fork, pulses) []  - 0 Peripheral Arterial Disease Assessment (using hand held doppler) ASSESSMENTS - Ostomy and/or Continence Assessment and Care []  - Incontinence Assessment and Management 0 []  - 0 Ostomy Care Assessment and Management (repouching, etc.) PROCESS - Coordination of Care X - Simple Patient / Family Education for ongoing care 1 15 []  - 0 Complex (extensive) Patient / Family Education for ongoing care X- 1 10 Staff obtains Programmer, systems, Records, Test Results / Process Orders []  - 0 Staff telephones HHA, Nursing Homes / Clarify orders / etc []  - 0 Routine Transfer to another Facility (non-emergent condition) []  - 0 Routine Hospital Admission (non-emergent condition) []  - 0 New Admissions / Biomedical engineer / Ordering NPWT, Apligraf, etc. []  - 0 Emergency Hospital Admission (emergent condition) X- 1 10 Simple Discharge Coordination KIMARI, LIENHARD ([id number]) []  - 0 Complex (extensive) Discharge Coordination PROCESS - Special Needs []  - Pediatric / Minor Patient Management 0 []  - 0 Isolation Patient Management []  - 0 Hearing / Language / Visual special needs []  - 0 Assessment of  Community assistance (transportation, D/C planning, etc.) []  - 0 Additional assistance / Altered mentation []  - 0 Support Surface(s) Assessment (bed, cushion, seat, etc.) INTERVENTIONS - Wound Cleansing /  Measurement X - Simple Wound Cleansing - one wound 1 5 []  - 0 Complex Wound Cleansing - multiple wounds X- 1 5 Wound Imaging (photographs - any number of wounds) []  - 0 Wound Tracing (instead of photographs) X- 1 5 Simple Wound Measurement - one wound []  - 0 Complex Wound Measurement - multiple wounds INTERVENTIONS - Wound Dressings []  - Small Wound Dressing one or multiple wounds 0 X- 1 15 Medium Wound Dressing one or multiple wounds []  - 0 Large Wound Dressing one or multiple wounds []  - 0 Application of Medications - topical []  - 0 Application of Medications - injection INTERVENTIONS - Miscellaneous []  - External ear exam 0 []  - 0 Specimen Collection (cultures, biopsies, blood, body fluids, etc.) []  - 0 Specimen(s) / Culture(s) sent or taken to Lab for analysis []  - 0 Patient Transfer (multiple staff / Civil Service fast streamer / Similar devices) []  - 0 Simple Staple / Suture removal (25 or less) []  - 0 Complex Staple / Suture removal (26 or more) []  - 0 Hypo / Hyperglycemic Management (close monitor of Blood Glucose) []  - 0 Ankle / Brachial Index (ABI) - do not check if billed separately X- 1 5 Vital Signs Whittier, Cadience G. ([id number]) Has the patient been seen at the hospital within the last three years: Yes Total Score: 80 Level Of Care: New/Established - Level 3 Electronic Signature(s) Signed: 05/05/2017 5:45:37 PM By: Gretta Cool, BSN, RN, CWS, Kim RN, BSN Entered By: Gretta Cool, BSN, RN, CWS, Kim on 05/05/2017 16:17:22 Teresa Krueger ([id number]) -------------------------------------------------------------------------------- Encounter Discharge Information Details Patient Name: Teresa Krueger Date of Service: 05/05/2017 3:30 PM Medical Record Number:  [id number] Patient Account Number: [id number] Date of Birth/Sex: 06-23-1934 (83 y.o. Female) Treating RN: Cornell Barman Primary Care Aubryana Vittorio: Ramonita Lab Other Clinician: Referring Murlean Seelye: Ramonita Lab Treating Palmira Stickle/Extender: Frann Rider in Treatment: 3 Encounter Discharge Information Items Discharge Pain Level: 0 Discharge Condition: Stable Ambulatory Status: Wheelchair Discharge Destination: Home Transportation: Private Auto Accompanied By: husband Schedule Follow-up Appointment: Yes Medication Reconciliation completed and Yes provided to Patient/Care Reyli Schroth: Provided on Clinical Summary of Care: 05/05/2017 Form Type Recipient Paper Patient JF Electronic Signature(s) Signed: 05/05/2017 5:45:37 PM By: Gretta Cool, BSN, RN, CWS, Kim RN, BSN Entered By: Gretta Cool, BSN, RN, CWS, Kim on 05/05/2017 16:18:27 Teresa Krueger ([id number]) -------------------------------------------------------------------------------- Lower Extremity Assessment Details Patient Name: Teresa Krueger Date of Service: 05/05/2017 3:30 PM Medical Record Number: [id number] Patient Account Number: [id number] Date of Birth/Sex: 08/02/1933 (83 y.o. Female) Treating RN: Cornell Barman Primary Care Lamorris Knoblock: Ramonita Lab Other Clinician: Referring Revin Corker: Ramonita Lab Treating Lafayette Dunlevy/Extender: Frann Rider in Treatment: 3 Edema Assessment Assessed: [Left: No] [Right: No] [Left: Edema] [Right: :] Calf Left: Right: Point of Measurement: 30 cm From Medial Instep 49 cm cm Ankle Left: Right: Point of Measurement: 10 cm From Medial Instep 30.4 cm cm Vascular Assessment Pulses: Dorsalis Pedis Palpable: [Left:Yes] Posterior Tibial Extremity colors, hair growth, and conditions: Extremity Color: [Left:Red] Hair Growth on Extremity: [Left:No] Temperature of Extremity: [Left:Cool] Capillary Refill: [Left:< 3 seconds] Toe Nail Assessment Left: Right: Thick: No Discolored: No Deformed:  No Improper Length and Hygiene: No Electronic Signature(s) Signed: 05/05/2017 5:45:37 PM By: Gretta Cool, BSN, RN, CWS, Kim RN, BSN Entered By: Gretta Cool, BSN, RN, CWS, Kim on 05/05/2017 15:54:21 Teresa Krueger ([id number]) -------------------------------------------------------------------------------- Multi Wound Chart Details Patient Name: Teresa Krueger Date of Service: 05/05/2017 3:30 PM Medical Record Number: [id number] Patient Account Number: [id number] Date of Birth/Sex: 22-Jul-1933 (83 y.o. Female) Treating RN: Cornell Barman  Primary Care Edgel Degnan: Ramonita Lab Other Clinician: Referring Izumi Mixon: Ramonita Lab Treating Gianella Chismar/Extender: Frann Rider in Treatment: 3 Vital Signs Height(in): 60 Pulse(bpm): 70 Weight(lbs): 251 Blood Pressure(mmHg): 134/38 Body Mass Index(BMI): 49 Temperature(F): 97.8 Respiratory Rate 16 (breaths/min): Photos: [N/A:N/A] Wound Location: Left Lower Leg - Medial N/A N/A Wounding Event: Trauma N/A N/A Primary Etiology: Trauma, Other N/A N/A Secondary Etiology: Diabetic Wound/Ulcer of the N/A N/A Lower Extremity Comorbid History: Cataracts, Chronic Obstructive N/A N/A Pulmonary Disease (COPD), Sleep Apnea, Hypertension, Type II Diabetes, Gout, Osteoarthritis, Neuropathy, Received Chemotherapy Date Acquired: 04/03/2017 N/A N/A Weeks of Treatment: 3 N/A N/A Wound Status: Open N/A N/A Measurements L x W x D 0.9x3.5x0.4 N/A N/A (cm) Area (cm) : 2.474 N/A N/A Volume (cm) : 0.99 N/A N/A % Reduction in Area: 61.80% N/A N/A % Reduction in Volume: -52.80% N/A N/A Classification: Full Thickness Without N/A N/A Exposed Support Structures Exudate Amount: Medium N/A N/A Exudate Type: Serosanguineous N/A N/A Exudate Color: red, brown N/A N/A Wound Margin: Distinct, outline attached N/A N/A Granulation Amount: Medium (34-66%) N/A N/A Granulation Quality: Red N/A N/A Necrotic Amount: Medium (34-66%) N/A N/A Exposed Structures: Fat Layer  (Subcutaneous N/A N/A Tissue) Exposed: Yes GRACEE, RATTERREE G. ([id number]) Fascia: No Tendon: No Muscle: No Joint: No Bone: No Epithelialization: Small (1-33%) N/A N/A Periwound Skin Texture: Scarring: Yes N/A N/A Excoriation: No Induration: No Callus: No Crepitus: No Rash: No Periwound Skin Moisture: Maceration: No N/A N/A Dry/Scaly: No Periwound Skin Color: Rubor: Yes N/A N/A Atrophie Blanche: No Cyanosis: No Ecchymosis: No Erythema: No Hemosiderin Staining: No Mottled: No Pallor: No Temperature: No Abnormality N/A N/A Tenderness on Palpation: Yes N/A N/A Wound Preparation: Ulcer Cleansing: N/A N/A Rinsed/Irrigated with Saline Topical Anesthetic Applied: Other: lidocaine 4% Treatment Notes Electronic Signature(s) Signed: 05/05/2017 4:17:30 PM By: Christin Fudge MD, FACS Entered By: Christin Fudge on 05/05/2017 16:17:30 Teresa Krueger ([id number]) -------------------------------------------------------------------------------- [place] Details Patient Name: Teresa Krueger Date of Service: 05/05/2017 3:30 PM Medical Record Number: [id number] Patient Account Number: [id number] Date of Birth/Sex: [birth year] (83 y.o. Female) Treating RN: Cornell Barman Primary Care Al Bracewell: Ramonita Lab Other Clinician: Referring Generoso Cropper: Ramonita Lab Treating Taiylor Virden/Extender: Frann Rider in Treatment: 3 Active Inactive ` Necrotic Tissue Nursing Diagnoses: Impaired tissue integrity related to necrotic/devitalized tissue Knowledge deficit related to management of necrotic/devitalized tissue Goals: Necrotic/devitalized tissue will be minimized in the wound bed Date Initiated: 04/14/2017 Target Resolution Date: 04/28/2017 Goal Status: Active Interventions: Assess patient pain level pre-, during and post procedure and prior to discharge Provide education on necrotic tissue and debridement process Treatment Activities: Apply topical anesthetic as  ordered : 04/14/2017 Notes: ` Orientation to the Wound Care Program Nursing Diagnoses: Knowledge deficit related to the wound healing center program Goals: Patient/caregiver will verbalize understanding of the [place] Date Initiated: 04/14/2017 Target Resolution Date: 04/21/2017 Goal Status: Active Interventions: Provide education on orientation to the wound center Notes: ` Wound/Skin Impairment Nursing Diagnoses: Impaired tissue integrity GoalsHAYLA, HINGER ([id number]) Ulcer/skin breakdown will heal within 14 weeks Date Initiated: 04/14/2017 Target Resolution Date: 08/04/2017 Goal Status: Active Interventions: Assess ulceration(s) every visit Treatment Activities: Referred to DME Jelitza Manninen for dressing supplies : 04/14/2017 Notes: Electronic Signature(s) Signed: 05/05/2017 5:45:37 PM By: Gretta Cool, BSN, RN, CWS, Kim RN, BSN Entered By: Gretta Cool, BSN, RN, CWS, Kim on 05/05/2017 15:54:27 Teresa Krueger ([id number]) -------------------------------------------------------------------------------- Pain Assessment Details Patient Name: Teresa Krueger Date of Service: 05/05/2017 3:30 PM Medical Record Number: [id number] Patient Account Number: [id number] Date  of Birth/Sex: July 24, 1933 (83 y.o. Female) Treating RN: Cornell Barman Primary Care Trip Cavanagh: Ramonita Lab Other Clinician: Referring Hetal Proano: Ramonita Lab Treating Kelcee Bjorn/Extender: Frann Rider in Treatment: 3 Active Problems Location of Pain Severity and Description of Pain Patient Has Paino Patient Unable to Respond Site Locations With Dressing Change: No Pain Management and Medication Current Pain Management: Goals for Pain Management Topical or injectable lidocaine is offered to patient for acute pain when surgical debridement is performed. If needed, Patient is instructed to use over the counter pain medication for the following 24-48 hours after debridement. Wound care MDs do  not prescribed pain medications. Patient has chronic pain or uncontrolled pain. Patient has been instructed to make an appointment with their Primary Care Physician for pain management. Electronic Signature(s) Signed: 05/05/2017 5:45:37 PM By: Gretta Cool, BSN, RN, CWS, Kim RN, BSN Entered By: Gretta Cool, BSN, RN, CWS, Kim on 05/05/2017 15:47:14 Teresa Krueger ([id number]) -------------------------------------------------------------------------------- Patient/Caregiver Education Details Patient Name: Teresa Krueger Date of Service: 05/05/2017 3:30 PM Medical Record Number: [id number] Patient Account Number: [id number] Date of Birth/Gender: [birth year] (83 y.o. Female) Treating RN: Cornell Barman Primary Care Physician: Ramonita Lab Other Clinician: Referring Physician: Ramonita Lab Treating Physician/Extender: Frann Rider in Treatment: 3 Education Assessment Education Provided To: Patient Education Topics Provided Wound/Skin Impairment: Handouts: Caring for Your Ulcer, Other: wound care as prescribed Methods: Demonstration, Explain/Verbal Responses: State content correctly Electronic Signature(s) Signed: 05/05/2017 5:45:37 PM By: Gretta Cool, BSN, RN, CWS, Kim RN, BSN Entered By: Gretta Cool, BSN, RN, CWS, Kim on 05/05/2017 16:18:47 Teresa Krueger ([id number]) -------------------------------------------------------------------------------- Wound Assessment Details Patient Name: Teresa Krueger Date of Service: 05/05/2017 3:30 PM Medical Record Number: [id number] Patient Account Number: [id number] Date of Birth/Sex: 12-13-1933 (83 y.o. Female) Treating RN: Cornell Barman Primary Care Deondre Marinaro: Ramonita Lab Other Clinician: Referring Sahid Borba: Ramonita Lab Treating Jurnie Garritano/Extender: Frann Rider in Treatment: 3 Wound Status Wound Number: 1 Primary Trauma, Other Etiology: Wound Location: Left Lower Leg - Medial Secondary Diabetic Wound/Ulcer of the Lower Extremity Wounding Event:  Trauma Etiology: Date Acquired: 04/03/2017 Wound Open Weeks Of Treatment: 3 Status: Clustered Wound: No Comorbid Cataracts, Chronic Obstructive Pulmonary History: Disease (COPD), Sleep Apnea, Hypertension, Type II Diabetes, Gout, Osteoarthritis, Neuropathy, Received Chemotherapy Photos Wound Measurements Length: (cm) 0.9 Width: (cm) 3.5 Depth: (cm) 0.4 Area: (cm) 2.474 Volume: (cm) 0.99 % Reduction in Area: 61.8% % Reduction in Volume: -52.8% Epithelialization: Small (1-33%) Tunneling: No Undermining: No Wound Description Full Thickness Without Exposed Support Foul Odo Classification: Structures Slough/F Wound Margin: Distinct, outline attached Exudate Medium Amount: Exudate Type: Serosanguineous Exudate Color: red, brown r After Cleansing: No ibrino Yes Wound Bed Granulation Amount: Medium (34-66%) Exposed Structure Granulation Quality: Red Fascia Exposed: No Necrotic Amount: Medium (34-66%) Fat Layer (Subcutaneous Tissue) Exposed: Yes Necrotic Quality: Adherent Slough Tendon Exposed: No Muscle Exposed: No Joint Exposed: No Bone Exposed: No Bacot, Shanette G. ([id number]) Periwound Skin Texture Texture Color No Abnormalities Noted: No No Abnormalities Noted: No Callus: No Atrophie Blanche: No Crepitus: No Cyanosis: No Excoriation: No Ecchymosis: No Induration: No Erythema: No Rash: No Hemosiderin Staining: No Scarring: Yes Mottled: No Pallor: No Moisture Rubor: Yes No Abnormalities Noted: No Dry / Scaly: No Temperature / Pain Maceration: No Temperature: No Abnormality Tenderness on Palpation: Yes Wound Preparation Ulcer Cleansing: Rinsed/Irrigated with Saline Topical Anesthetic Applied: Other: lidocaine 4%, Treatment Notes Wound #1 (Left, Medial Lower Leg) 1. Cleansed with: Clean wound with Normal Saline 2. Anesthetic Topical Lidocaine 4% cream to wound bed prior to debridement 4. Dressing Applied:  Other dressing (specify in  notes) 5. Secondary Dressing Applied ABD and Kerlix/Conform 7. Secured with Other (specify in notes) Notes SIlvercel, Event organiser) Signed: 05/05/2017 5:45:37 PM By: Gretta Cool, BSN, RN, CWS, Kim RN, BSN Entered By: Gretta Cool, BSN, RN, CWS, Kim on 05/05/2017 15:52:20 Teresa Krueger ([id number]) -------------------------------------------------------------------------------- [place] Details Patient Name: Teresa Krueger Date of Service: 05/05/2017 3:30 PM Medical Record Number: [id number] Patient Account Number: [id number] Date of Birth/Sex: 11/12/1933 (83 y.o. Female) Treating RN: Cornell Barman Primary Care Jade Burkard: Ramonita Lab Other Clinician: Referring Franz Svec: Ramonita Lab Treating Jaydenn Boccio/Extender: Frann Rider in Treatment: 3 Vital Signs Time Taken: 15:55 Temperature (F): 97.8 Height (in): 60 Pulse (bpm): 70 Weight (lbs): 251 Respiratory Rate (breaths/min): 16 Body Mass Index (BMI): 49 Blood Pressure (mmHg): 134/38 Reference Range: 80 - 120 mg / dl Notes Patient is being followed by PCP for low diastolic pressure. Electronic Signature(s) Signed: 05/05/2017 5:45:37 PM By: Gretta Cool, BSN, RN, CWS, Kim RN, BSN Entered By: Gretta Cool, BSN, RN, CWS, Kim on 05/05/2017 15:58:25

## 2017-05-06 NOTE — Progress Notes (Signed)
Teresa Krueger, Teresa Krueger ([id number]) Visit Report for 05/05/2017 Chief Complaint Document Details Patient Name: Teresa Krueger, Teresa Krueger Date of Service: 05/05/2017 3:30 PM Medical Record Number: [id number] Patient Account Number: [id number] Date of Birth/Sex: [birth year] (83 y.o. Female) Treating RN: Cornell Barman Primary Care Provider: Ramonita Lab Other Clinician: Referring Provider: Ramonita Lab Treating Provider/Extender: Frann Rider in Treatment: 3 Information Obtained from: Patient Chief Complaint Patients presents for treatment of an open diabetic ulcer to the left lower extremity caused by trauma 2 weeks ago Electronic Signature(s) Signed: 05/05/2017 4:17:40 PM By: Christin Fudge MD, FACS Entered By: Christin Fudge on 05/05/2017 16:17:40 Teresa Krueger ([id number]) -------------------------------------------------------------------------------- HPI Details Patient Name: Teresa Krueger Date of Service: 05/05/2017 3:30 PM Medical Record Number: [id number] Patient Account Number: [id number] Date of Birth/Sex: 10/22/1933 (83 y.o. Female) Treating RN: Cornell Barman Primary Care Provider: Ramonita Lab Other Clinician: Referring Provider: Ramonita Lab Treating Provider/Extender: Frann Rider in Treatment: 3 History of Present Illness Location: left lower extremity Quality: Patient reports experiencing a sharp pain to affected area(s). Severity: Patient states wound are getting worse. Duration: Patient has had the wound for < 2 weeks prior to presenting for treatment Timing: Pain in wound is Intermittent (comes and goes Context: The wound occurred when the patient had a blunt injury to the left lower extremity which caused a large hematoma Modifying Factors: Other treatment(s) tried include:has been put on oral antibiotics by her PCP Associated Signs and Symptoms: Patient reports having increase swelling. HPI Description: 83 year old patient, known to be a diabetic was recently seen  by her PCP regarding an injury to the left lower extremity which she has had about 2 weeks. She injured it using an electric wheelchair and hitting the ramp on her [place] Lei and had an x-ray in the ER which was negative for any fracture. The patient however says she has not had any x-ray done in the ER. I did not find any x-ray reported in her electronic medical record. She has minimal drainage from the wound and it was covered with eschar. Past medical history significant for breast cancer, COPD, diabetes mellitus type 2, essential hypertension, morbid obesity, status post cardiac pacemaker, cholecystectomy, mastectomy, EGD for Barrett's esophagus. Patient was given doxycycline for 10 days and referred to the wound center. The patient has had a bilateral lower extremity venous reflux examination done in February 2018 and it was showing no venous incompetence bilaterally. 04/21/2017 -- after review of her wound today I have recommended a snap vacuum system and we'll try and get this authorized by her insurance. 05/05/2017 -- the snap vacuum applied last week lasted of 5 days before the canister got full. The wound is doing very well though Electronic Signature(s) Signed: 05/05/2017 4:18:17 PM By: Christin Fudge MD, FACS Entered By: Christin Fudge on 05/05/2017 16:18:16 Teresa Krueger ([id number]) -------------------------------------------------------------------------------- Physical Exam Details Patient Name: Teresa Krueger Date of Service: 05/05/2017 3:30 PM Medical Record Number: [id number] Patient Account Number: [id number] Date of Birth/Sex: [birth year] (83 y.o. Female) Treating RN: Cornell Barman Primary Care Provider: Ramonita Lab Other Clinician: Referring Provider: Ramonita Lab Treating Provider/Extender: Frann Rider in Treatment: 3 Constitutional . Pulse regular. Respirations normal and unlabored. Afebrile. . Eyes Nonicteric. Reactive to light. Ears, Nose, Mouth, and  Throat Lips, teeth, and gums WNL.Marland Kitchen Moist mucosa without lesions. Neck supple and nontender. No palpable supraclavicular or cervical adenopathy. Normal sized without goiter. Respiratory WNL. No retractions.. Cardiovascular Pedal Pulses WNL. No clubbing, cyanosis or edema. Lymphatic No adneopathy. No adenopathy. No  adenopathy. Musculoskeletal Adexa without tenderness or enlargement.. Digits and nails w/o clubbing, cyanosis, infection, petechiae, ischemia, or inflammatory conditions.. Integumentary (Hair, Skin) No suspicious lesions. No crepitus or fluctuance. No peri-wound warmth or erythema. No masses.Marland Kitchen Psychiatric Judgement and insight Intact.. No evidence of depression, anxiety, or agitation.. Notes no sharp debridement was required today but I was able to clean off the wound with a moist saline gauze and a Q-tip and there is minimal depth towards the medial end. Electronic Signature(s) Signed: 05/05/2017 4:19:12 PM By: Christin Fudge MD, FACS Entered By: Christin Fudge on 05/05/2017 16:19:12 Teresa Krueger ([id number]) -------------------------------------------------------------------------------- Physician Orders Details Patient Name: Teresa Krueger Date of Service: 05/05/2017 3:30 PM Medical Record Number: [id number] Patient Account Number: [id number] Date of Birth/Sex: 1934-04-25 (83 y.o. Female) Treating RN: Cornell Barman Primary Care Provider: Ramonita Lab Other Clinician: Referring Provider: Ramonita Lab Treating Provider/Extender: Frann Rider in Treatment: 3 Verbal / Phone Orders: No Diagnosis Coding Wound Cleansing Wound #1 Left,Medial Lower Leg o Clean wound with Normal Saline. o May Shower, gently pat wound dry prior to applying new dressing. Anesthetic Wound #1 Left,Medial Lower Leg o Topical Lidocaine 4% cream applied to wound bed prior to debridement Primary Wound Dressing Wound #1 Left,Medial Lower Leg o Silvercel Non-Adherent Secondary  Dressing Wound #1 Left,Medial Lower Leg o ABD and Kerlix/Conform - Coban Dressing Change Frequency Wound #1 Left,Medial Lower Leg o Change dressing every day. Follow-up Appointments Wound #1 Left,Medial Lower Leg o Return Appointment in 1 week. o Nurse Visit as needed Edema Control Wound #1 Left,Medial Lower Leg o Elevate legs to the level of the heart and pump ankles as often as possible Electronic Signature(s) Signed: 05/05/2017 4:30:10 PM By: Christin Fudge MD, FACS Entered By: Christin Fudge on 05/05/2017 16:19:33 Teresa Krueger ([id number]) -------------------------------------------------------------------------------- Problem List Details Patient Name: Teresa Krueger Date of Service: 05/05/2017 3:30 PM Medical Record Number: [id number] Patient Account Number: [id number] Date of Birth/Sex: Mar 24, 1934 (83 y.o. Female) Treating RN: Cornell Barman Primary Care Provider: Ramonita Lab Other Clinician: Referring Provider: Ramonita Lab Treating Provider/Extender: Frann Rider in Treatment: 3 Active Problems ICD-10 Encounter Code Description Active Date Diagnosis E11.622 Type 2 diabetes mellitus with other skin ulcer 04/14/2017 Yes L97.222 Non-pressure chronic ulcer of left calf with fat layer exposed 04/14/2017 Yes S81.812A Laceration without foreign body, left lower leg, initial encounter 04/14/2017 Yes M70.962 Unspecified soft tissue disorder related to use, overuse and 04/14/2017 Yes pressure, left lower leg E66.01 Morbid (severe) obesity due to excess calories 04/14/2017 Yes Z99.3 Dependence on wheelchair 04/14/2017 Yes Inactive Problems Resolved Problems Electronic Signature(s) Signed: 05/05/2017 4:17:25 PM By: Christin Fudge MD, FACS Entered By: Christin Fudge on 05/05/2017 16:17:25 Teresa Krueger ([id number]) -------------------------------------------------------------------------------- Progress Note Details Patient Name: Teresa Krueger Date  of Service: 05/05/2017 3:30 PM Medical Record Number: [id number] Patient Account Number: [id number] Date of Birth/Sex: 10/20/1933 (83 y.o. Female) Treating RN: Cornell Barman Primary Care Provider: Ramonita Lab Other Clinician: Referring Provider: Ramonita Lab Treating Provider/Extender: Frann Rider in Treatment: 3 Subjective Chief Complaint Information obtained from Patient Patients presents for treatment of an open diabetic ulcer to the left lower extremity caused by trauma 2 weeks ago History of Present Illness (HPI) The following HPI elements were documented for the patient's wound: Location: left lower extremity Quality: Patient reports experiencing a sharp pain to affected area(s). Severity: Patient states wound are getting worse. Duration: Patient has had the wound for < 2 weeks prior to presenting for treatment Timing: Pain in wound is  Intermittent (comes and goes Context: The wound occurred when the patient had a blunt injury to the left lower extremity which caused a large hematoma Modifying Factors: Other treatment(s) tried include:has been put on oral antibiotics by her PCP Associated Signs and Symptoms: Patient reports having increase swelling. 83 year old patient, known to be a diabetic was recently seen by her PCP regarding an injury to the left lower extremity which she has had about 2 weeks. She injured it using an electric wheelchair and hitting the ramp on her [place]i and had an x-ray in the ER which was negative for any fracture. The patient however says she has not had any x-ray done in the ER. I did not find any x-ray reported in her electronic medical record. She has minimal drainage from the wound and it was covered with eschar. Past medical history significant for breast cancer, COPD, diabetes mellitus type 2, essential hypertension, morbid obesity, status post cardiac pacemaker, cholecystectomy, mastectomy, EGD for Barrett's esophagus. Patient was given doxycycline  for 10 days and referred to the wound center. The patient has had a bilateral lower extremity venous reflux examination done in February 2018 and it was showing no venous incompetence bilaterally. 04/21/2017 -- after review of her wound today I have recommended a snap vacuum system and we'll try and get this authorized by her insurance. 05/05/2017 -- the snap vacuum applied last week lasted of 5 days before the canister got full. The wound is doing very well though Patient History Information obtained from Patient. Family History Diabetes - Paternal Grandparents, Heart Disease - [place] Grandparents, Hypertension - Mother, No family history of Cancer, Kidney Disease, Lung Disease, Seizures, Stroke, Thyroid Problems, Tuberculosis. Social History Former smoker - Quit 1994, Marital Status - Married, Alcohol Use - Never, Drug Use - No History, Caffeine Use - Never. Medical And Surgical History Notes Oncologic Teresa Krueger, Teresa Krueger ([id number]) Breast Cancer 1999 Objective Constitutional Pulse regular. Respirations normal and unlabored. Afebrile. Vitals Time Taken: 3:55 PM, Height: 60 in, Weight: 251 lbs, BMI: 49, Temperature: 97.8 F, Pulse: 70 bpm, Respiratory Rate: 16 breaths/min, Blood Pressure: 134/38 mmHg. General Notes: Patient is being followed by PCP for low diastolic pressure. Eyes Nonicteric. Reactive to light. Ears, Nose, Mouth, and Throat Lips, teeth, and gums WNL.Marland Kitchen Moist mucosa without lesions. Neck supple and nontender. No palpable supraclavicular or cervical adenopathy. Normal sized without goiter. Respiratory WNL. No retractions.. Cardiovascular Pedal Pulses WNL. No clubbing, cyanosis or edema. Lymphatic No adneopathy. No adenopathy. No adenopathy. Musculoskeletal Adexa without tenderness or enlargement.. Digits and nails w/o clubbing, cyanosis, infection, petechiae, ischemia, or inflammatory conditions.Marland Kitchen Psychiatric Judgement and  insight Intact.. No evidence of depression, anxiety, or agitation.. General Notes: no sharp debridement was required today but I was able to clean off the wound with a moist saline gauze and a Q-tip and there is minimal depth towards the medial end. Integumentary (Hair, Skin) No suspicious lesions. No crepitus or fluctuance. No peri-wound warmth or erythema. No masses.. Wound #1 status is Open. Original cause of wound was Trauma. The wound is located on the Left,Medial Lower Leg. The wound measures 0.9cm length x 3.5cm width x 0.4cm depth; 2.474cm^2 area and 0.99cm^3 volume. There is Fat Layer (Subcutaneous Tissue) Exposed exposed. There is no tunneling or undermining noted. There is a medium amount of serosanguineous drainage noted. The wound margin is distinct with the outline attached to the wound base. There is medium (34-66%) red granulation within the wound bed. There is a medium (34-66%) amount of necrotic  tissue within the wound bed Krueger, Shamiah G. ([id number]) including [place]. The periwound skin appearance exhibited: Scarring, Rubor. The periwound skin appearance did not exhibit: Callus, Crepitus, Excoriation, Induration, Rash, Dry/Scaly, Maceration, Atrophie Blanche, Cyanosis, Ecchymosis, Hemosiderin Staining, Mottled, Pallor, Erythema. Periwound temperature was noted as No Abnormality. The periwound has tenderness on palpation. Assessment Active Problems ICD-10 E11.622 - Type 2 diabetes mellitus with other skin ulcer L97.222 - Non-pressure chronic ulcer of left calf with fat layer exposed S81.812A - Laceration without foreign body, left lower leg, initial encounter M70.962 - Unspecified soft tissue disorder related to use, overuse and pressure, left lower leg E66.01 - Morbid (severe) obesity due to excess calories Z99.3 - Dependence on wheelchair Plan Wound Cleansing: Wound #1 Left,Medial Lower Leg: Clean wound with Normal Saline. May Shower, gently pat wound dry  prior to applying new dressing. Anesthetic: Wound #1 Left,Medial Lower Leg: Topical Lidocaine 4% cream applied to wound bed prior to debridement Primary Wound Dressing: Wound #1 Left,Medial Lower Leg: Silvercel Non-Adherent Secondary Dressing: Wound #1 Left,Medial Lower Leg: ABD and Kerlix/Conform - Coban Dressing Change Frequency: Wound #1 Left,Medial Lower Leg: Change dressing every day. Follow-up Appointments: Wound #1 Left,Medial Lower Leg: Return Appointment in 1 week. Nurse Visit as needed Edema Control: Wound #1 Left,Medial Lower Leg: Elevate legs to the level of the heart and pump ankles as often as possible today I do not recommend the snap vacuum but I have recommended: Teresa, Krueger. ([id number]) 1. Washing this area daily with soap and water and packing it with a silver alginate rope. 2. Elevation and exercise 3. Adequate protein, vitamin A, vitamin C and zinc 4. I anticipate next week she will be ready for possibly a collagen dressing She and her husband have had all questions answered to their satisfaction. Electronic Signature(s) Signed: 05/05/2017 4:21:53 PM By: Christin Fudge MD, FACS Entered By: Christin Fudge on 05/05/2017 16:21:53 Teresa Krueger ([id number]) -------------------------------------------------------------------------------- ROS/PFSH Details Patient Name: Teresa Krueger Date of Service: 05/05/2017 3:30 PM Medical Record Number: [id number] Patient Account Number: [id number] Date of Birth/Sex: 1933-07-12 (83 y.o. Female) Treating RN: Cornell Barman Primary Care Provider: Ramonita Lab Other Clinician: Referring Provider: Ramonita Lab Treating Provider/Extender: Frann Rider in Treatment: 3 Information Obtained From Patient Wound History Do you currently have one or more open woundso Yes How many open wounds do you currently haveo 1 Approximately how long have you had your woundso 2 weeks How have you been treating your wound(s)  until nowo vasaline Has your wound(s) ever healed and then re-openedo No Have you had any lab work done in the past montho No Have you tested positive for an antibiotic resistant organism (MRSA, VRE)o No Have you tested positive for osteomyelitis (bone infection)o No Have you had any tests for circulation on your legso No Eyes Medical History: Positive for: Cataracts - Removed Negative for: Glaucoma; Optic Neuritis Ear/Nose/Mouth/Throat Medical History: Negative for: Chronic sinus problems/congestion; Middle ear problems Hematologic/Lymphatic Medical History: Negative for: Anemia; Hemophilia; Human Immunodeficiency Virus; Lymphedema; Sickle Cell Disease Respiratory Medical History: Positive for: Chronic Obstructive Pulmonary Disease (COPD); Sleep Apnea Negative for: Aspiration; Asthma; Pneumothorax; Tuberculosis Cardiovascular Medical History: Positive for: Hypertension Negative for: Angina; Arrhythmia; Congestive Heart Failure; Coronary Artery Disease; Deep Vein Thrombosis; Hypotension; Myocardial Infarction; Peripheral Arterial Disease; Peripheral Venous Disease; Phlebitis; Vasculitis Gastrointestinal Medical History: Negative for: Cirrhosis ; Colitis; Crohnos; Hepatitis A; Hepatitis B; Hepatitis C Endocrine Teresa Krueger, Teresa Krueger ([id number]) Medical History: Positive for: Type II Diabetes Negative for: Type I Diabetes Time with  diabetes: 20 plus years Treated with: Insulin Blood sugar tested every day: Yes Tested : 3 Blood sugar testing results: Breakfast: 92; Bedtime: 120 Genitourinary Medical History: Negative for: End Stage Renal Disease Immunological Medical History: Negative for: Lupus Erythematosus; Raynaudos; Scleroderma Integumentary (Skin) Medical History: Negative for: History of Burn; History of pressure wounds Musculoskeletal Medical History: Positive for: Gout; Osteoarthritis Negative for: Rheumatoid Arthritis; Osteomyelitis Neurologic Medical  History: Positive for: Neuropathy - Hands and feet Negative for: Dementia; Quadriplegia; Paraplegia; Seizure Disorder Oncologic Medical History: Positive for: Received Chemotherapy Negative for: Received Radiation Past Medical History Notes: Breast Cancer 1999 HBO Extended History Items Eyes: Cataracts Immunizations Pneumococcal Vaccine: Received Pneumococcal Vaccination: Yes Implantable Devices Family and Social History Cancer: No; Diabetes: Yes - Paternal Grandparents; Heart Disease: Yes - Mother,Father,Maternal Grandparents,Paternal Grandparents; Hypertension: Yes - Mother; Kidney Disease: No; Lung Disease: No; Seizures: No; Stroke: No; Thyroid Problems: No; Tuberculosis: No; Former smoker - Quit 1994; Marital Status - Married; Alcohol Use: Never; Drug Use: No Teresa Krueger, GLOOR. ([id number]) History; Caffeine Use: Never; Advanced Directives: Yes (Not Provided); Patient does not want information on Advanced Directives; Living Will: Yes (Not Provided); Medical Power of Attorney: Yes (Not Provided) Physician Affirmation I have reviewed and agree with the above information. Electronic Signature(s) Signed: 05/05/2017 4:30:10 PM By: Christin Fudge MD, FACS Signed: 05/05/2017 5:45:37 PM By: Gretta Cool BSN, RN, CWS, Kim RN, BSN Entered By: Christin Fudge on 05/05/2017 16:18:32 Teresa Krueger ([id number]) -------------------------------------------------------------------------------- [place] Details Patient Name: Teresa Krueger Date of Service: 05/05/2017 Medical Record Number: [id number] Patient Account Number: [id number] Date of Birth/Sex: May 31, 1934 (83 y.o. Female) Treating RN: Cornell Barman Primary Care Provider: Ramonita Lab Other Clinician: Referring Provider: Ramonita Lab Treating Provider/Extender: Frann Rider in Treatment: 3 Diagnosis Coding ICD-10 Codes Code Description E11.622 Type 2 diabetes mellitus with other skin ulcer L97.222 Non-pressure chronic ulcer of  left calf with fat layer exposed S81.812A Laceration without foreign body, left lower leg, initial encounter M70.962 Unspecified soft tissue disorder related to use, overuse and pressure, left lower leg E66.01 Morbid (severe) obesity due to excess calories Z99.3 Dependence on wheelchair Facility Procedures CPT4 Code: [id number] Description: 99213 - WOUND CARE VISIT-LEV 3 EST PT Modifier: Quantity: 1 Physician Procedures CPT4 Code Description: [id number] 99213 - WC PHYS LEVEL 3 - EST PT ICD-10 Diagnosis Description E11.622 Type 2 diabetes mellitus with other skin ulcer L97.222 Non-pressure chronic ulcer of left calf with fat layer exposed S81.812A Laceration without foreign  body, left lower leg, initial encou M70.962 Unspecified soft tissue disorder related to use, overuse and p Modifier: nter ressure, left lower Quantity: 1 leg Electronic Signature(s) Signed: 05/05/2017 4:22:12 PM By: Christin Fudge MD, FACS Entered By: Christin Fudge on 05/05/2017 16:22:12

## 2017-05-12 ENCOUNTER — Encounter: Payer: Medicare Other | Admitting: Surgery

## 2017-05-12 DIAGNOSIS — E11622 Type 2 diabetes mellitus with other skin ulcer: Secondary | ICD-10-CM | POA: Diagnosis not present

## 2017-05-12 NOTE — Progress Notes (Signed)
Teresa Krueger, Teresa Krueger ([id number]) Visit Report for 05/12/2017 Arrival Information Details Patient Name: Teresa Krueger, Teresa Krueger Date of Service: 05/12/2017 1:45 PM Medical Record Number: [id number] Patient Account Number: [id number] Date of Birth/Sex: 10/05/1933 (83 y.o. Female) Treating RN: Roger Shelter Primary Care Chad Donoghue: Ramonita Lab Other Clinician: Referring Yeila Morro: Ramonita Lab Treating Ellieanna Funderburg/Extender: Frann Rider in Treatment: 4 Visit Information History Since Last Visit All ordered tests and consults were completed: No Patient Arrived: Wheel Chair Added or deleted any medications: No Arrival Time: 13:51 Any new allergies or adverse reactions: No Accompanied By: husband Had a fall or experienced change in No Transfer Assistance: None activities of daily living that may affect Patient Requires Transmission-Based No risk of falls: Precautions: Signs or symptoms of abuse/neglect since last visito No Patient Has Alerts: Yes Hospitalized since last visit: No Patient Alerts: Patient on Blood Pain Present Now: No Thinner Type II Diabetic Plavix Electronic Signature(s) Signed: 05/12/2017 4:01:21 PM By: Roger Shelter Entered By: Roger Shelter on 05/12/2017 13:52:12 Teresa Krueger ([id number]) -------------------------------------------------------------------------------- Clinic Level of Care Assessment Details Patient Name: Teresa Krueger Date of Service: 05/12/2017 1:45 PM Medical Record Number: [id number] Patient Account Number: [id number] Date of Birth/Sex: 04/23/1934 (83 y.o. Female) Treating RN: Roger Shelter Primary Care Clemence Stillings: Ramonita Lab Other Clinician: Referring Shaydon Lease: Ramonita Lab Treating Dimonique Bourdeau/Extender: Frann Rider in Treatment: 4 Clinic Level of Care Assessment Items TOOL 4 Quantity Score []  - Use when only an EandM is performed on FOLLOW-UP visit 0 ASSESSMENTS - Nursing Assessment / Reassessment []  - Reassessment  of Co-morbidities (includes updates in patient status) 0 X- 1 5 Reassessment of Adherence to Treatment Plan ASSESSMENTS - Wound and Skin Assessment / Reassessment X - Simple Wound Assessment / Reassessment - one wound 1 5 []  - 0 Complex Wound Assessment / Reassessment - multiple wounds []  - 0 Dermatologic / Skin Assessment (not related to wound area) ASSESSMENTS - Focused Assessment []  - Circumferential Edema Measurements - multi extremities 0 []  - 0 Nutritional Assessment / Counseling / Intervention []  - 0 Lower Extremity Assessment (monofilament, tuning fork, pulses) []  - 0 Peripheral Arterial Disease Assessment (using hand held doppler) ASSESSMENTS - Ostomy and/or Continence Assessment and Care []  - Incontinence Assessment and Management 0 []  - 0 Ostomy Care Assessment and Management (repouching, etc.) PROCESS - Coordination of Care X - Simple Patient / Family Education for ongoing care 1 15 []  - 0 Complex (extensive) Patient / Family Education for ongoing care X- 1 10 Staff obtains Programmer, systems, Records, Test Results / Process Orders []  - 0 Staff telephones HHA, Nursing Homes / Clarify orders / etc []  - 0 Routine Transfer to another Facility (non-emergent condition) []  - 0 Routine Hospital Admission (non-emergent condition) []  - 0 New Admissions / Biomedical engineer / Ordering NPWT, Apligraf, etc. []  - 0 Emergency Hospital Admission (emergent condition) X- 1 10 Simple Discharge Coordination Teresa Krueger, Teresa Krueger ([id number]) []  - 0 Complex (extensive) Discharge Coordination PROCESS - Special Needs []  - Pediatric / Minor Patient Management 0 []  - 0 Isolation Patient Management []  - 0 Hearing / Language / Visual special needs []  - 0 Assessment of Community assistance (transportation, D/C planning, etc.) []  - 0 Additional assistance / Altered mentation []  - 0 Support Surface(s) Assessment (bed, cushion, seat, etc.) INTERVENTIONS - Wound Cleansing / Measurement X  - Simple Wound Cleansing - one wound 1 5 []  - 0 Complex Wound Cleansing - multiple wounds X- 1 5 Wound Imaging (photographs - any number of wounds) []  - 0 Wound  Tracing (instead of photographs) X- 1 5 Simple Wound Measurement - one wound []  - 0 Complex Wound Measurement - multiple wounds INTERVENTIONS - Wound Dressings X - Small Wound Dressing one or multiple wounds 1 10 []  - 0 Medium Wound Dressing one or multiple wounds []  - 0 Large Wound Dressing one or multiple wounds []  - 0 Application of Medications - topical []  - 0 Application of Medications - injection INTERVENTIONS - Miscellaneous []  - External ear exam 0 []  - 0 Specimen Collection (cultures, biopsies, blood, body fluids, etc.) []  - 0 Specimen(s) / Culture(s) sent or taken to Lab for analysis []  - 0 Patient Transfer (multiple staff / Civil Service fast streamer / Similar devices) []  - 0 Simple Staple / Suture removal (25 or less) []  - 0 Complex Staple / Suture removal (26 or more) []  - 0 Hypo / Hyperglycemic Management (close monitor of Blood Glucose) []  - 0 Ankle / Brachial Index (ABI) - do not check if billed separately X- 1 5 Vital Signs Teresa Krueger, Teresa G. ([id number]) Has the patient been seen at the hospital within the last three years: Yes Total Score: 75 Level Of Care: New/Established - Level 2 Electronic Signature(s) Signed: 05/12/2017 4:01:21 PM By: Roger Shelter Entered By: Roger Shelter on 05/12/2017 14:28:32 Teresa Krueger ([id number]) -------------------------------------------------------------------------------- Encounter Discharge Information Details Patient Name: Teresa Krueger Date of Service: 05/12/2017 1:45 PM Medical Record Number: [id number] Patient Account Number: [id number] Date of Birth/Sex: April 17, 1934 (83 y.o. Female) Treating RN: Roger Shelter Primary Care Brelee Renk: Ramonita Lab Other Clinician: Referring Tyrail Grandfield: Ramonita Lab Treating Heather Streeper/Extender: Frann Rider in  Treatment: 4 Encounter Discharge Information Items Discharge Pain Level: 0 Discharge Condition: Stable Ambulatory Status: Wheelchair Discharge Destination: Home Transportation: Private Auto Accompanied By: husband Schedule Follow-up Appointment: Yes Medication Reconciliation completed and No provided to Patient/Care Ines Warf: Provided on Clinical Summary of Care: 05/12/2017 Form Type Recipient Paper Patient JF Electronic Signature(s) Signed: 05/12/2017 4:01:21 PM By: Roger Shelter Entered By: Roger Shelter on 05/12/2017 14:30:07 Teresa Krueger ([id number]) -------------------------------------------------------------------------------- Lower Extremity Assessment Details Patient Name: Teresa Krueger Date of Service: 05/12/2017 1:45 PM Medical Record Number: [id number] Patient Account Number: [id number] Date of Birth/Sex: [birth year] (83 y.o. Female) Treating RN: Roger Shelter Primary Care Bion Todorov: Ramonita Lab Other Clinician: Referring Khamiya Varin: Ramonita Lab Treating Tylen Leverich/Extender: Frann Rider in Treatment: 4 Edema Assessment Assessed: [Left: No] [Right: No] Edema: [Left: Ye] [Right: s] Vascular Assessment Claudication: Claudication Assessment [Left:Intermittent] Pulses: Dorsalis Pedis Palpable: [Left:Yes] Posterior Tibial Extremity colors, hair growth, and conditions: Extremity Color: [Left:Normal] Hair Growth on Extremity: [Left:Yes] Temperature of Extremity: [Left:Warm] Capillary Refill: [Left:< 3 seconds] Toe Nail Assessment Left: Right: Thick: No Discolored: No Deformed: No Improper Length and Hygiene: No Electronic Signature(s) Signed: 05/12/2017 4:01:21 PM By: Roger Shelter Entered By: Roger Shelter on 05/12/2017 14:04:18 Teresa Krueger ([id number]) -------------------------------------------------------------------------------- Multi Wound Chart Details Patient Name: Teresa Krueger Date of Service: 05/12/2017  1:45 PM Medical Record Number: [id number] Patient Account Number: [id number] Date of Birth/Sex: 1934-03-31 (83 y.o. Female) Treating RN: Roger Shelter Primary Care Arshi Duarte: Ramonita Lab Other Clinician: Referring Elizabeth Haff: Ramonita Lab Treating Mylea Roarty/Extender: Frann Rider in Treatment: 4 Vital Signs Height(in): 60 Pulse(bpm): 65 Weight(lbs): 251 Blood Pressure(mmHg): 136/45 Body Mass Index(BMI): 49 Temperature(F): 98.5 Respiratory Rate 16 (breaths/min): Photos: [N/A:N/A] Wound Location: Left Lower Leg - Medial N/A N/A Wounding Event: Trauma N/A N/A Primary Etiology: Trauma, Other N/A N/A Secondary Etiology: Diabetic Wound/Ulcer of the N/A N/A Lower Extremity Comorbid History: Cataracts, Chronic Obstructive N/A N/A Pulmonary Disease (  COPD), Sleep Apnea, Hypertension, Type II Diabetes, Gout, Osteoarthritis, Neuropathy, Received Chemotherapy Date Acquired: 04/03/2017 N/A N/A Weeks of Treatment: 4 N/A N/A Wound Status: Open N/A N/A Measurements L x W x D 0.5x1.8x0.4 N/A N/A (cm) Area (cm) : 0.707 N/A N/A Volume (cm) : 0.283 N/A N/A % Reduction in Area: 89.10% N/A N/A % Reduction in Volume: 56.30% N/A N/A Classification: Full Thickness Without N/A N/A Exposed Support Structures Exudate Amount: Medium N/A N/A Exudate Type: Serosanguineous N/A N/A Exudate Color: red, brown N/A N/A Wound Margin: Distinct, outline attached N/A N/A Granulation Amount: Medium (34-66%) N/A N/A Granulation Quality: Red N/A N/A Necrotic Amount: Medium (34-66%) N/A N/A Teresa Krueger, Teresa Krueger ([id number]) Exposed Structures: Fat Layer (Subcutaneous N/A N/A Tissue) Exposed: Yes Fascia: No Tendon: No Muscle: No Joint: No Bone: No Epithelialization: Small (1-33%) N/A N/A Periwound Skin Texture: Scarring: Yes N/A N/A Excoriation: No Induration: No Callus: No Crepitus: No Rash: No Periwound Skin Moisture: Maceration: No N/A N/A Dry/Scaly: No Periwound Skin Color: Rubor: Yes  N/A N/A Atrophie Blanche: No Cyanosis: No Ecchymosis: No Erythema: No Hemosiderin Staining: No Mottled: No Pallor: No Temperature: No Abnormality N/A N/A Tenderness on Palpation: Yes N/A N/A Wound Preparation: Ulcer Cleansing: N/A N/A Rinsed/Irrigated with Saline Topical Anesthetic Applied: Other: lidocaine 4% Treatment Notes Electronic Signature(s) Signed: 05/12/2017 2:15:50 PM By: Christin Fudge MD, FACS Entered By: Christin Fudge on 05/12/2017 14:15:50 Teresa Krueger ([id number]) -------------------------------------------------------------------------------- [place] Details Patient Name: Teresa Krueger Date of Service: 05/12/2017 1:45 PM Medical Record Number: [id number] Patient Account Number: [id number] Date of Birth/Sex: Aug 19, 1933 (83 y.o. Female) Treating RN: Roger Shelter Primary Care Ebb Carelock: Ramonita Lab Other Clinician: Referring Cheyann Blecha: Ramonita Lab Treating Emilina Smarr/Extender: Frann Rider in Treatment: 4 Active Inactive ` Necrotic Tissue Nursing Diagnoses: Impaired tissue integrity related to necrotic/devitalized tissue Knowledge deficit related to management of necrotic/devitalized tissue Goals: Necrotic/devitalized tissue will be minimized in the wound bed Date Initiated: 04/14/2017 Target Resolution Date: 04/28/2017 Goal Status: Active Interventions: Assess patient pain level pre-, during and post procedure and prior to discharge Provide education on necrotic tissue and debridement process Treatment Activities: Apply topical anesthetic as ordered : 04/14/2017 Notes: ` Orientation to the Wound Care Program Nursing Diagnoses: Knowledge deficit related to the wound healing center program Goals: Patient/caregiver will verbalize understanding of the [place] Date Initiated: 04/14/2017 Target Resolution Date: 04/21/2017 Goal Status: Active Interventions: Provide education on orientation to the  wound center Notes: ` Wound/Skin Impairment Nursing Diagnoses: Impaired tissue integrity GoalsREINETTE, Teresa Krueger ([id number]) Ulcer/skin breakdown will heal within 14 weeks Date Initiated: 04/14/2017 Target Resolution Date: 08/04/2017 Goal Status: Active Interventions: Assess ulceration(s) every visit Treatment Activities: Referred to DME Arnett Duddy for dressing supplies : 04/14/2017 Notes: Electronic Signature(s) Signed: 05/12/2017 4:01:21 PM By: Roger Shelter Entered By: Roger Shelter on 05/12/2017 14:04:34 Teresa Krueger ([id number]) -------------------------------------------------------------------------------- Pain Assessment Details Patient Name: Teresa Krueger Date of Service: 05/12/2017 1:45 PM Medical Record Number: [id number] Patient Account Number: [id number] Date of Birth/Sex: 1934-06-10 (83 y.o. Female) Treating RN: Roger Shelter Primary Care Elohim Brune: Ramonita Lab Other Clinician: Referring Deryk Bozman: Ramonita Lab Treating Tyrena Gohr/Extender: Frann Rider in Treatment: 4 Active Problems Location of Pain Severity and Description of Pain Patient Has Paino No Site Locations Pain Management and Medication Current Pain Management: Electronic Signature(s) Signed: 05/12/2017 4:01:21 PM By: Roger Shelter Entered By: Roger Shelter on 05/12/2017 13:52:20 Teresa Krueger ([id number]) -------------------------------------------------------------------------------- Patient/Caregiver Education Details Patient Name: Teresa Krueger Date of Service: 05/12/2017 1:45 PM Medical Record  Number: [id number] Patient Account Number: [id number] Date of Birth/Gender: 01-Dec-1933 (83 y.o. Female) Treating RN: Roger Shelter Primary Care Physician: Ramonita Lab Other Clinician: Referring Physician: Ramonita Lab Treating Physician/Extender: Frann Rider in Treatment: 4 Education Assessment Education Provided To: Patient Education Topics  Provided Wound/Skin Impairment: Handouts: Caring for Your Ulcer Methods: Explain/Verbal Responses: State content correctly Electronic Signature(s) Signed: 05/12/2017 4:01:21 PM By: Roger Shelter Entered By: Roger Shelter on 05/12/2017 14:30:22 Teresa Krueger ([id number]) -------------------------------------------------------------------------------- Wound Assessment Details Patient Name: Teresa Krueger Date of Service: 05/12/2017 1:45 PM Medical Record Number: [id number] Patient Account Number: [id number] Date of Birth/Sex: 06-14-1934 (83 y.o. Female) Treating RN: Roger Shelter Primary Care Zaydn Gutridge: Ramonita Lab Other Clinician: Referring Khadeja Abt: Ramonita Lab Treating Chazz Philson/Extender: Frann Rider in Treatment: 4 Wound Status Wound Number: 1 Primary Trauma, Other Etiology: Wound Location: Left Lower Leg - Medial Secondary Diabetic Wound/Ulcer of the Lower Extremity Wounding Event: Trauma Etiology: Date Acquired: 04/03/2017 Wound Open Weeks Of Treatment: 4 Status: Clustered Wound: No Comorbid Cataracts, Chronic Obstructive Pulmonary History: Disease (COPD), Sleep Apnea, Hypertension, Type II Diabetes, Gout, Osteoarthritis, Neuropathy, Received Chemotherapy Photos Wound Measurements Length: (cm) 0.5 Width: (cm) 1.8 Depth: (cm) 0.4 Area: (cm) 0.707 Volume: (cm) 0.283 % Reduction in Area: 89.1% % Reduction in Volume: 56.3% Epithelialization: Small (1-33%) Tunneling: No Undermining: No Wound Description Full Thickness Without Exposed Support Foul Odo Classification: Structures Slough/F Wound Margin: Distinct, outline attached Exudate Medium Amount: Exudate Type: Serosanguineous Exudate Color: red, brown r After Cleansing: No ibrino Yes Wound Bed Granulation Amount: Medium (34-66%) Exposed Structure Granulation Quality: Red Fascia Exposed: No Necrotic Amount: Medium (34-66%) Fat Layer (Subcutaneous Tissue) Exposed: Yes Necrotic  Quality: Adherent Slough Tendon Exposed: No Muscle Exposed: No Teresa Krueger, Teresa G. ([id number]) Joint Exposed: No Bone Exposed: No Periwound Skin Texture Texture Color No Abnormalities Noted: No No Abnormalities Noted: No Callus: No Atrophie Blanche: No Crepitus: No Cyanosis: No Excoriation: No Ecchymosis: No Induration: No Erythema: No Rash: No Hemosiderin Staining: No Scarring: Yes Mottled: No Pallor: No Moisture Rubor: Yes No Abnormalities Noted: No Dry / Scaly: No Temperature / Pain Maceration: No Temperature: No Abnormality Tenderness on Palpation: Yes Wound Preparation Ulcer Cleansing: Rinsed/Irrigated with Saline Topical Anesthetic Applied: Other: lidocaine 4%, Treatment Notes Wound #1 (Left, Medial Lower Leg) 1. Cleansed with: Clean wound with Normal Saline 2. Anesthetic Topical Lidocaine 4% cream to wound bed prior to debridement 4. Dressing Applied: Other dressing (specify in notes) 5. Secondary Dressing Applied ABD Pad Notes Silvercel, gauzewrap Electronic Signature(s) Signed: 05/12/2017 4:01:21 PM By: Roger Shelter Entered By: Roger Shelter on 05/12/2017 14:02:42 Teresa Krueger ([id number]) -------------------------------------------------------------------------------- [place] Details Patient Name: Teresa Krueger Date of Service: 05/12/2017 1:45 PM Medical Record Number: [id number] Patient Account Number: [id number] Date of Birth/Sex: 03/03/1934 (83 y.o. Female) Treating RN: Roger Shelter Primary Care Jani Ploeger: Ramonita Lab Other Clinician: Referring Mckinzy Fuller: Ramonita Lab Treating Fortune Torosian/Extender: Frann Rider in Treatment: 4 Vital Signs Time Taken: 01:52 Temperature (F): 98.5 Height (in): 60 Pulse (bpm): 68 Weight (lbs): 251 Respiratory Rate (breaths/min): 16 Body Mass Index (BMI): 49 Blood Pressure (mmHg): 136/45 Reference Range: 80 - 120 mg / dl Electronic Signature(s) Signed: 05/12/2017 4:01:21 PM By:  Roger Shelter Entered By: Roger Shelter on 05/12/2017 13:55:13

## 2017-05-13 NOTE — Progress Notes (Signed)
Teresa Krueger ([id number]) Visit Report for 05/12/2017 Chief Complaint Document Details Patient Name: Teresa Krueger, Teresa Krueger Date of Service: 05/12/2017 1:45 PM Medical Record Number: [id number] Patient Account Number: [id number] Date of Birth/Sex: 1934/02/25 (83 y.o. Female) Treating RN: Roger Shelter Primary Care Provider: Ramonita Lab Other Clinician: Referring Provider: Ramonita Lab Treating Provider/Extender: Frann Rider in Treatment: 4 Information Obtained from: Patient Chief Complaint Patients presents for treatment of an open diabetic ulcer to the left lower extremity caused by trauma 2 weeks ago Electronic Signature(s) Signed: 05/12/2017 2:16:08 PM By: Christin Fudge MD, FACS Entered By: Christin Fudge on 05/12/2017 [place], [place] ([id number]) -------------------------------------------------------------------------------- HPI Details Patient Name: Teresa Krueger Date of Service: 05/12/2017 1:45 PM Medical Record Number: [id number] Patient Account Number: [id number] Date of Birth/Sex: 11-25-1933 (83 y.o. Female) Treating RN: Roger Shelter Primary Care Provider: Ramonita Lab Other Clinician: Referring Provider: Ramonita Lab Treating Provider/Extender: Frann Rider in Treatment: 4 History of Present Illness Location: left lower extremity Quality: Patient reports experiencing a sharp pain to affected area(s). Severity: Patient states wound are getting worse. Duration: Patient has had the wound for < 2 weeks prior to presenting for treatment Timing: Pain in wound is Intermittent (comes and goes Context: The wound occurred when the patient had a blunt injury to the left lower extremity which caused a large hematoma Modifying Factors: Other treatment(s) tried include:has been put on oral antibiotics by her PCP Associated Signs and Symptoms: Patient reports having increase swelling. HPI Description: 83 year old patient, known to be a diabetic was  recently seen by her PCP regarding an injury to the left lower extremity which she has had about 2 weeks. She injured it using an electric wheelchair and hitting the ramp on her [place]ne Lei and had an x-ray in the ER which was negative for any fracture. The patient however says she has not had any x-ray done in the ER. I did not find any x-ray reported in her electronic medical record. She has minimal drainage from the wound and it was covered with eschar. Past medical history significant for breast cancer, COPD, diabetes mellitus type 2, essential hypertension, morbid obesity, status post cardiac pacemaker, cholecystectomy, mastectomy, EGD for Barrett's esophagus. Patient was given doxycycline for 10 days and referred to the wound center. The patient has had a bilateral lower extremity venous reflux examination done in February 2018 and it was showing no venous incompetence bilaterally. 04/21/2017 -- after review of her wound today I have recommended a snap vacuum system and we'll try and get this authorized by her insurance. 05/05/2017 -- the snap vacuum applied last week lasted of 5 days before the canister got full. The wound is doing very well though 05/12/2017 -- she has been doing very well without the snap vacuum and local care has been helping her. Electronic Signature(s) Signed: 05/12/2017 2:16:39 PM By: Christin Fudge MD, FACS Entered By: Christin Fudge on 05/12/2017 14:16:39 Teresa Krueger ([id number]) -------------------------------------------------------------------------------- Physical Exam Details Patient Name: Teresa Krueger Date of Service: 05/12/2017 1:45 PM Medical Record Number: [id number] Patient Account Number: [id number] Date of Birth/Sex: 1933/10/23 (83 y.o. Female) Treating RN: Roger Shelter Primary Care Provider: Ramonita Lab Other Clinician: Referring Provider: Ramonita Lab Treating Provider/Extender: Frann Rider in Treatment: 4 Constitutional . Pulse  regular. Respirations normal and unlabored. Afebrile. . Eyes Nonicteric. Reactive to light. Ears, Nose, Mouth, and Throat Lips, teeth, and gums WNL.Marland Kitchen Moist mucosa without lesions. Neck supple and nontender. No palpable supraclavicular or cervical adenopathy. Normal sized without goiter.  Respiratory WNL. No retractions.. Cardiovascular Pedal Pulses WNL. No clubbing, cyanosis or edema. Lymphatic No adneopathy. No adenopathy. No adenopathy. Musculoskeletal Adexa without tenderness or enlargement.. Digits and nails w/o clubbing, cyanosis, infection, petechiae, ischemia, or inflammatory conditions.. Integumentary (Hair, Skin) No suspicious lesions. No crepitus or fluctuance. No peri-wound warmth or erythema. No masses.Marland Kitchen Psychiatric Judgement and insight Intact.. No evidence of depression, anxiety, or agitation.. Notes the wound was washed out a Q-tip and saline gauze and it is fairly clean after this was done and there was no bleeding. Electronic Signature(s) Signed: 05/12/2017 2:17:28 PM By: Christin Fudge MD, FACS Entered By: Christin Fudge on 05/12/2017 14:17:28 Teresa Krueger ([id number]) -------------------------------------------------------------------------------- Physician Orders Details Patient Name: Teresa Krueger Date of Service: 05/12/2017 1:45 PM Medical Record Number: [id number] Patient Account Number: [id number] Date of Birth/Sex: 21-Nov-1933 (83 y.o. Female) Treating RN: Roger Shelter Primary Care Provider: Ramonita Lab Other Clinician: Referring Provider: Ramonita Lab Treating Provider/Extender: Frann Rider in Treatment: 4 Verbal / Phone Orders: No Diagnosis Coding Wound Cleansing Wound #1 Left,Medial Lower Leg o Clean wound with Normal Saline. o May Shower, gently pat wound dry prior to applying new dressing. Anesthetic Wound #1 Left,Medial Lower Leg o Topical Lidocaine 4% cream applied to wound bed prior to debridement Primary Wound  Dressing Wound #1 Left,Medial Lower Leg o Silvercel Non-Adherent Secondary Dressing Wound #1 Left,Medial Lower Leg o ABD and Kerlix/Conform - Coban Dressing Change Frequency Wound #1 Left,Medial Lower Leg o Three times weekly Follow-up Appointments Wound #1 Left,Medial Lower Leg o Return Appointment in 1 week. o Nurse Visit as needed Edema Control Wound #1 Left,Medial Lower Leg o Elevate legs to the level of the heart and pump ankles as often as possible Electronic Signature(s) Signed: 05/12/2017 4:01:21 PM By: Roger Shelter Signed: 05/12/2017 4:20:07 PM By: Christin Fudge MD, FACS Entered By: Roger Shelter on 05/12/2017 14:15:10 Teresa Krueger ([id number]) -------------------------------------------------------------------------------- Problem List Details Patient Name: Teresa Krueger Date of Service: 05/12/2017 1:45 PM Medical Record Number: [id number] Patient Account Number: [id number] Date of Birth/Sex: 1934-01-28 (83 y.o. Female) Treating RN: Roger Shelter Primary Care Provider: Ramonita Lab Other Clinician: Referring Provider: Ramonita Lab Treating Provider/Extender: Frann Rider in Treatment: 4 Active Problems ICD-10 Encounter Code Description Active Date Diagnosis E11.622 Type 2 diabetes mellitus with other skin ulcer 04/14/2017 Yes L97.222 Non-pressure chronic ulcer of left calf with fat layer exposed 04/14/2017 Yes S81.812A Laceration without foreign body, left lower leg, initial encounter 04/14/2017 Yes M70.962 Unspecified soft tissue disorder related to use, overuse and 04/14/2017 Yes pressure, left lower leg E66.01 Morbid (severe) obesity due to excess calories 04/14/2017 Yes Z99.3 Dependence on wheelchair 04/14/2017 Yes Inactive Problems Resolved Problems Electronic Signature(s) Signed: 05/12/2017 2:15:45 PM By: Christin Fudge MD, FACS Entered By: Christin Fudge on 05/12/2017 14:15:45 Teresa Krueger  ([id number]) -------------------------------------------------------------------------------- Progress Note Details Patient Name: Teresa Krueger Date of Service: 05/12/2017 1:45 PM Medical Record Number: [id number] Patient Account Number: [id number] Date of Birth/Sex: [birth year] (83 y.o. Female) Treating RN: Roger Shelter Primary Care Provider: Ramonita Lab Other Clinician: Referring Provider: Ramonita Lab Treating Provider/Extender: Frann Rider in Treatment: 4 Subjective Chief Complaint Information obtained from Patient Patients presents for treatment of an open diabetic ulcer to the left lower extremity caused by trauma 2 weeks ago History of Present Illness (HPI) The following HPI elements were documented for the patient's wound: Location: left lower extremity Quality: Patient reports experiencing a sharp pain to affected area(s). Severity: Patient states wound are getting worse. Duration: Patient has  had the wound for < 2 weeks prior to presenting for treatment Timing: Pain in wound is Intermittent (comes and goes Context: The wound occurred when the patient had a blunt injury to the left lower extremity which caused a large hematoma Modifying Factors: Other treatment(s) tried include:has been put on oral antibiotics by her PCP Associated Signs and Symptoms: Patient reports having increase swelling. 83 year old patient, known to be a diabetic was recently seen by her PCP regarding an injury to the left lower extremity which she has had about 2 weeks. She injured it using an electric wheelchair and hitting the ramp on her [place] Lei and had an x-ray in the ER which was negative for any fracture. The patient however says she has not had any x-ray done in the ER. I did not find any x-ray reported in her electronic medical record. She has minimal drainage from the wound and it was covered with eschar. Past medical history significant for breast cancer, COPD, diabetes mellitus type  2, essential hypertension, morbid obesity, status post cardiac pacemaker, cholecystectomy, mastectomy, EGD for Barrett's esophagus. Patient was given doxycycline for 10 days and referred to the wound center. The patient has had a bilateral lower extremity venous reflux examination done in February 2018 and it was showing no venous incompetence bilaterally. 04/21/2017 -- after review of her wound today I have recommended a snap vacuum system and we'll try and get this authorized by her insurance. 05/05/2017 -- the snap vacuum applied last week lasted of 5 days before the canister got full. The wound is doing very well though 05/12/2017 -- she has been doing very well without the snap vacuum and local care has been helping her. Patient History Information obtained from Patient. Family History Diabetes - Paternal Grandparents, Heart Disease - [place] Grandparents, Hypertension - Mother, No family history of Cancer, Kidney Disease, Lung Disease, Seizures, Stroke, Thyroid Problems, Tuberculosis. Social History Former smoker - Quit 1994, Marital Status - Married, Alcohol Use - Never, Drug Use - No History, Caffeine Use - Never. Teresa Krueger, Teresa Krueger ([id number]) Medical And Surgical History Notes Oncologic Breast Cancer 1999 Objective Constitutional Pulse regular. Respirations normal and unlabored. Afebrile. Vitals Time Taken: 1:52 AM, Height: 60 in, Weight: 251 lbs, BMI: 49, Temperature: 98.5 F, Pulse: 68 bpm, Respiratory Rate: 16 breaths/min, Blood Pressure: 136/45 mmHg. Eyes Nonicteric. Reactive to light. Ears, Nose, Mouth, and Throat Lips, teeth, and gums WNL.Marland Kitchen Moist mucosa without lesions. Neck supple and nontender. No palpable supraclavicular or cervical adenopathy. Normal sized without goiter. Respiratory WNL. No retractions.. Cardiovascular Pedal Pulses WNL. No clubbing, cyanosis or edema. Lymphatic No adneopathy. No adenopathy. No  adenopathy. Musculoskeletal Adexa without tenderness or enlargement.. Digits and nails w/o clubbing, cyanosis, infection, petechiae, ischemia, or inflammatory conditions.Marland Kitchen Psychiatric Judgement and insight Intact.. No evidence of depression, anxiety, or agitation.. General Notes: the wound was washed out a Q-tip and saline gauze and it is fairly clean after this was done and there was no bleeding. Integumentary (Hair, Skin) No suspicious lesions. No crepitus or fluctuance. No peri-wound warmth or erythema. No masses.. Wound #1 status is Open. Original cause of wound was Trauma. The wound is located on the Left,Medial Lower Leg. The wound measures 0.5cm length x 1.8cm width x 0.4cm depth; 0.707cm^2 area and 0.283cm^3 volume. There is Fat Layer (Subcutaneous Tissue) Exposed exposed. There is no tunneling or undermining noted. There is a medium amount of serosanguineous drainage noted. The wound margin is distinct with the outline attached to the wound base. There is  medium Krueger, Teresa G. ([id number]) (34-66%) red granulation within the wound bed. There is a medium (34-66%) amount of necrotic tissue within the wound bed including Adherent Slough. The periwound skin appearance exhibited: Scarring, Rubor. The periwound skin appearance did not exhibit: Callus, Crepitus, Excoriation, Induration, Rash, Dry/Scaly, Maceration, Atrophie Blanche, Cyanosis, Ecchymosis, Hemosiderin Staining, Mottled, Pallor, Erythema. Periwound temperature was noted as No Abnormality. The periwound has tenderness on palpation. Assessment Active Problems ICD-10 E11.622 - Type 2 diabetes mellitus with other skin ulcer L97.222 - Non-pressure chronic ulcer of left calf with fat layer exposed S81.812A - Laceration without foreign body, left lower leg, initial encounter M70.962 - Unspecified soft tissue disorder related to use, overuse and pressure, left lower leg E66.01 - Morbid (severe) obesity due to excess  calories Z99.3 - Dependence on wheelchair Plan Wound Cleansing: Wound #1 Left,Medial Lower Leg: Clean wound with Normal Saline. May Shower, gently pat wound dry prior to applying new dressing. Anesthetic: Wound #1 Left,Medial Lower Leg: Topical Lidocaine 4% cream applied to wound bed prior to debridement Primary Wound Dressing: Wound #1 Left,Medial Lower Leg: Silvercel Non-Adherent Secondary Dressing: Wound #1 Left,Medial Lower Leg: ABD and Kerlix/Conform - Coban Dressing Change Frequency: Wound #1 Left,Medial Lower Leg: Three times weekly Follow-up Appointments: Wound #1 Left,Medial Lower Leg: Return Appointment in 1 week. Nurse Visit as needed Edema Control: Wound #1 Left,Medial Lower Leg: Elevate legs to the level of the heart and pump ankles as often as possible Teresa Krueger, Teresa G. ([id number]) after a thorough review today, I have recommended: 1. Washing this area with soap and water and packing it with a silver alginate rope. This can be done every other day 2. Elevation and exercise 3. Adequate protein, vitamin A, vitamin C and zinc 4. I anticipate next week she will be ready for possibly a collagen dressing She and her husband have had all questions answered to their satisfaction. Electronic Signature(s) Signed: 05/12/2017 2:19:21 PM By: Christin Fudge MD, FACS Entered By: Christin Fudge on 05/12/2017 14:19:21 Teresa Krueger ([id number]) -------------------------------------------------------------------------------- ROS/PFSH Details Patient Name: Teresa Krueger Date of Service: 05/12/2017 1:45 PM Medical Record Number: [id number] Patient Account Number: [id number] Date of Birth/Sex: 18-Jul-1933 (83 y.o. Female) Treating RN: Roger Shelter Primary Care Provider: Ramonita Lab Other Clinician: Referring Provider: Ramonita Lab Treating Provider/Extender: Frann Rider in Treatment: 4 Information Obtained From Patient Wound History Do you currently have  one or more open woundso Yes How many open wounds do you currently haveo 1 Approximately how long have you had your woundso 2 weeks How have you been treating your wound(s) until nowo vasaline Has your wound(s) ever healed and then re-openedo No Have you had any lab work done in the past montho No Have you tested positive for an antibiotic resistant organism (MRSA, VRE)o No Have you tested positive for osteomyelitis (bone infection)o No Have you had any tests for circulation on your legso No Eyes Medical History: Positive for: Cataracts - Removed Negative for: Glaucoma; Optic Neuritis Ear/Nose/Mouth/Throat Medical History: Negative for: Chronic sinus problems/congestion; Middle ear problems Hematologic/Lymphatic Medical History: Negative for: Anemia; Hemophilia; Human Immunodeficiency Virus; Lymphedema; Sickle Cell Disease Respiratory Medical History: Positive for: Chronic Obstructive Pulmonary Disease (COPD); Sleep Apnea Negative for: Aspiration; Asthma; Pneumothorax; Tuberculosis Cardiovascular Medical History: Positive for: Hypertension Negative for: Angina; Arrhythmia; Congestive Heart Failure; Coronary Artery Disease; Deep Vein Thrombosis; Hypotension; Myocardial Infarction; Peripheral Arterial Disease; Peripheral Venous Disease; Phlebitis; Vasculitis Gastrointestinal Medical History: Negative for: Cirrhosis ; Colitis; Crohnos; Hepatitis A; Hepatitis B; Hepatitis C Endocrine Teresa Krueger,  Teresa Krueger ([id number]) Medical History: Positive for: Type II Diabetes Negative for: Type I Diabetes Time with diabetes: 20 plus years Treated with: Insulin Blood sugar tested every day: Yes Tested : 3 Blood sugar testing results: Breakfast: 92; Bedtime: 120 Genitourinary Medical History: Negative for: End Stage Renal Disease Immunological Medical History: Negative for: Lupus Erythematosus; Raynaudos; Scleroderma Integumentary (Skin) Medical History: Negative for: History of Burn;  History of pressure wounds Musculoskeletal Medical History: Positive for: Gout; Osteoarthritis Negative for: Rheumatoid Arthritis; Osteomyelitis Neurologic Medical History: Positive for: Neuropathy - Hands and feet Negative for: Dementia; Quadriplegia; Paraplegia; Seizure Disorder Oncologic Medical History: Positive for: Received Chemotherapy Negative for: Received Radiation Past Medical History Notes: Breast Cancer 1999 HBO Extended History Items Eyes: Cataracts Immunizations Pneumococcal Vaccine: Received Pneumococcal Vaccination: Yes Implantable Devices Family and Social History Cancer: No; Diabetes: Yes - Paternal Grandparents; Heart Disease: Yes - Mother,Father,Maternal Grandparents,Paternal Grandparents; Hypertension: Yes - Mother; Kidney Disease: No; Lung Disease: No; Seizures: No; Stroke: No; Thyroid Problems: No; Tuberculosis: No; Former smoker - Quit 1994; Marital Status - Married; Alcohol Use: Never; Drug Use: No Teresa Krueger, Teresa Krueger. ([id number]) History; Caffeine Use: Never; Advanced Directives: Yes (Not Provided); Patient does not want information on Advanced Directives; Living Will: Yes (Not Provided); Medical Power of Attorney: Yes (Not Provided) Physician Affirmation I have reviewed and agree with the above information. Electronic Signature(s) Signed: 05/12/2017 4:01:21 PM By: Roger Shelter Signed: 05/12/2017 4:20:07 PM By: Christin Fudge MD, FACS Entered By: Christin Fudge on 05/12/2017 14:16:47 Teresa Krueger ([id number]) -------------------------------------------------------------------------------- SuperBill Details Patient Name: Teresa Krueger Date of Service: 05/12/2017 Medical Record Number: [id number] Patient Account Number: [id number] Date of Birth/Sex: [birth year] (83 y.o. Female) Treating RN: Roger Shelter Primary Care Provider: Ramonita Lab Other Clinician: Referring Provider: Ramonita Lab Treating Provider/Extender: Frann Rider in  Treatment: 4 Diagnosis Coding ICD-10 Codes Code Description E11.622 Type 2 diabetes mellitus with other skin ulcer L97.222 Non-pressure chronic ulcer of left calf with fat layer exposed S81.812A Laceration without foreign body, left lower leg, initial encounter M70.962 Unspecified soft tissue disorder related to use, overuse and pressure, left lower leg E66.01 Morbid (severe) obesity due to excess calories Z99.3 Dependence on wheelchair Physician Procedures CPT4 Code Description: [id number] - WC PHYS LEVEL 3 - EST PT ICD-10 Diagnosis Description L97.222 Non-pressure chronic ulcer of left calf with fat layer exposed E11.622 Type 2 diabetes mellitus with other skin ulcer S81.812A Laceration without foreign  body, left lower leg, initial encou M70.962 Unspecified soft tissue disorder related to use, overuse and p Modifier: nter ressure, left low Quantity: 1 er leg Electronic Signature(s) Signed: 05/12/2017 2:19:41 PM By: Christin Fudge MD, FACS Entered By: Christin Fudge on 05/12/2017 14:19:40

## 2017-05-19 ENCOUNTER — Encounter: Payer: Medicare Other | Admitting: Surgery

## 2017-05-19 DIAGNOSIS — E11622 Type 2 diabetes mellitus with other skin ulcer: Secondary | ICD-10-CM | POA: Diagnosis not present

## 2017-05-21 NOTE — Progress Notes (Signed)
Teresa Krueger, Teresa Krueger ([id number]) Visit Report for 05/19/2017 Arrival Information Details Patient Name: Teresa Krueger, Teresa Krueger Date of Service: 05/19/2017 11:00 AM Medical Record Number: [id number] Patient Account Number: [id number] Date of Birth/Sex: [birth year] (83 y.o. Female) Treating RN: Roger Shelter Primary Care Kinzie Wickes: Ramonita Lab Other Clinician: Referring Zriyah Kopplin: Ramonita Lab Treating Winona Sison/Extender: Frann Rider in Treatment: 5 Visit Information History Since Last Visit All ordered tests and consults were completed: No Patient Arrived: Wheel Chair Added or deleted any medications: No Arrival Time: 11:00 Any new allergies or adverse reactions: No Accompanied By: husband Had a fall or experienced change in No Transfer Assistance: None activities of daily living that Teresa affect Patient Requires Transmission-Based No risk of falls: Precautions: Signs or symptoms of abuse/neglect since last visito No Patient Has Alerts: Yes Hospitalized since last visit: No Patient Alerts: Patient on Blood Pain Present Now: No Thinner Type II Diabetic Plavix Electronic Signature(s) Signed: 05/20/2017 4:40:58 PM By: Roger Shelter Entered By: Roger Shelter on 05/19/2017 11:04:49 Teresa Krueger ([id number]) -------------------------------------------------------------------------------- Encounter Discharge Information Details Patient Name: Teresa Krueger Date of Service: 05/19/2017 11:00 AM Medical Record Number: [id number] Patient Account Number: [id number] Date of Birth/Sex: Teresa [birth year] (83 y.o. Female) Treating RN: Roger Shelter Primary Care Berlyn Saylor: Ramonita Lab Other Clinician: Referring [name]: Ramonita Lab Treating Eltha Tingley/Extender: Frann Rider in Treatment: 5 Encounter Discharge Information Items Discharge Pain Level: 0 Discharge Condition: Stable Ambulatory Status: Wheelchair Discharge Destination:  Home Private Transportation: Auto Accompanied By: husband Schedule Follow-up Appointment: Yes Medication Reconciliation completed and provided No to Patient/Care Owens Hara: Clinical Summary of Care: Electronic Signature(s) Signed: 05/20/2017 4:40:58 PM By: Roger Shelter Entered By: Roger Shelter on 05/19/2017 11:47:21 Teresa Krueger ([id number]) -------------------------------------------------------------------------------- Lower Extremity Assessment Details Patient Name: Teresa Krueger Date of Service: 05/19/2017 11:00 AM Medical Record Number: [id number] Patient Account Number: [id number] Date of Birth/Sex: Teresa [birth year] (83 y.o. Female) Treating RN: Roger Shelter Primary Care Sulo Janczak: Ramonita Lab Other Clinician: Referring Hamzeh Tall: Ramonita Lab Treating Kaiyan Luczak/Extender: Frann Rider in Treatment: 5 Edema Assessment Assessed: [Left: No] [Right: No] Edema: [Left: Ye] [Right: s] Vascular Assessment Claudication: Claudication Assessment [Left:None] Pulses: Dorsalis Pedis Palpable: [Left:Yes] Posterior Tibial Extremity colors, hair growth, and conditions: Extremity Color: [Left:Normal] Hair Growth on Extremity: [Left:No] Temperature of Extremity: [Left:Cool] Capillary Refill: [Left:< 3 seconds] Toe Nail Assessment Left: Right: Thick: No Discolored: No Deformed: No Improper Length and Hygiene: No Electronic Signature(s) Signed: 05/20/2017 4:40:58 PM By: Roger Shelter Entered By: Roger Shelter on 05/19/2017 11:16:28 Teresa Krueger ([id number]) -------------------------------------------------------------------------------- Multi Wound Chart Details Patient Name: Teresa Krueger Date of Service: 05/19/2017 11:00 AM Medical Record Number: [id number] Patient Account Number: [id number] Date of Birth/Sex: Teresa [birth year] (83 y.o. Female) Treating RN: Roger Shelter Primary Care Shadrach Bartunek: Ramonita Lab Other Clinician: Referring Jerrianne Hartin:  Ramonita Lab Treating Christophr Calix/Extender: Frann Rider in Treatment: 5 Vital Signs Height(in): 60 Pulse(bpm): 67 Weight(lbs): 251 Blood Pressure(mmHg): 145/40 Body Mass Index(BMI): 49 Temperature(F): 97.9 Respiratory Rate 16 (breaths/min): Photos: [N/A:N/A] Wound Location: Left Lower Leg - Medial N/A N/A Wounding Event: Trauma N/A N/A Primary Etiology: Trauma, Other N/A N/A Secondary Etiology: Diabetic Wound/Ulcer of the N/A N/A Lower Extremity Comorbid History: Cataracts, Chronic Obstructive N/A N/A Pulmonary Disease (COPD), Sleep Apnea, Hypertension, Type II Diabetes, Gout, Osteoarthritis, Neuropathy, Received Chemotherapy Date Acquired: 04/03/2017 N/A N/A Weeks of Treatment: 5 N/A N/A Wound Status: Open N/A N/A Measurements L x W x D 0.5x0.9x0.2 N/A N/A (cm) Area (cm) : 0.353 N/A N/A Volume (cm) : 0.071 N/A N/A % Reduction in Area: 94.60%  N/A N/A % Reduction in Volume: 89.00% N/A N/A Krueger: Full Thickness Without N/A N/A Exposed Support Structures Exudate Amount: None Present N/A N/A Wound Margin: Distinct, outline attached N/A N/A Granulation Amount: Small (1-33%) N/A N/A Granulation Quality: Red N/A N/A Necrotic Amount: Large (67-100%) N/A N/A Necrotic Tissue: Eschar N/A N/A Exposed Structures: Fat Layer (Subcutaneous N/A N/A Tissue) Exposed: Yes Fascia: No Teresa Krueger, Teresa Krueger. ([id number]) Tendon: No Muscle: No Joint: No Bone: No Epithelialization: Large (67-100%) N/A N/A Debridement: Debridement ([id number]) N/A N/A Pre-procedure 11:30 N/A N/A Verification/Time Out Taken: Pain Control: Other N/A N/A Tissue Debrided: Necrotic/Eschar, Skin, N/A N/A Subcutaneous Level: Skin/Subcutaneous Tissue N/A N/A Debridement Area (sq cm): 0.45 N/A N/A Instrument: Forceps N/A N/A Bleeding: Minimum N/A N/A Hemostasis Achieved: Pressure N/A N/A Procedural Pain: 0 N/A N/A Post Procedural Pain: 0 N/A N/A Debridement Treatment Procedure was  tolerated well N/A N/A Response: Post Debridement 0.5x0.9x0.2 N/A N/A Measurements L x W x D (cm) Post Debridement Volume: 0.071 N/A N/A (cm) Periwound Skin Texture: Scarring: Yes N/A N/A Excoriation: No Induration: No Callus: No Crepitus: No Rash: No Periwound Skin Moisture: Dry/Scaly: Yes N/A N/A Maceration: No Periwound Skin Color: Rubor: Yes N/A N/A Atrophie Blanche: No Cyanosis: No Ecchymosis: No Erythema: No Hemosiderin Staining: No Mottled: No Pallor: No Temperature: No Abnormality N/A N/A Tenderness on Palpation: Yes N/A N/A Wound Preparation: Ulcer Cleansing: N/A N/A Rinsed/Irrigated with Saline Topical Anesthetic Applied: Other: lidocaine 4% Procedures Performed: Debridement N/A N/A Treatment Notes Wound #1 (Left, Medial Lower Leg) 1. Cleansed with: Clean wound with Normal Saline 2. Anesthetic Topical Lidocaine 4% cream to wound bed prior to debridement 4. Dressing Applied: Teresa Krueger, Teresa Krueger ([id number]) Prisma Ag 5. Secondary Dressing Applied ABD Pad Notes gauze wrap Electronic Signature(s) Signed: 05/19/2017 11:52:41 AM By: Christin Fudge MD, FACS Entered By: Christin Fudge on 05/19/2017 11:52:41 Teresa Krueger ([id number]) -------------------------------------------------------------------------------- [place] Details Patient Name: Teresa Krueger Date of Service: 05/19/2017 11:00 AM Medical Record Number: [id number] Patient Account Number: [id number] Date of Birth/Sex: Teresa Krue[birth year] (83 y.o. Female) Treating RN: Roger Shelter Primary Care Winfield Caba: Ramonita Lab Other Clinician: Referring Trevon Strothers: Ramonita Lab Treating Melyssa Signor/Extender: Frann Rider in Treatment: 5 Active Inactive ` Necrotic Tissue Nursing Diagnoses: Impaired tissue integrity related to necrotic/devitalized tissue Knowledge deficit related to management of necrotic/devitalized tissue Goals: Necrotic/devitalized tissue will be minimized  in the wound bed Date Initiated: 04/14/2017 Target Resolution Date: 04/28/2017 Goal Status: Active Interventions: Assess patient pain level pre-, during and post procedure and prior to discharge Provide education on necrotic tissue and debridement process Treatment Activities: Apply topical anesthetic as ordered : 04/14/2017 Notes: ` Orientation to the Wound Care Program Nursing Diagnoses: Knowledge deficit related to the wound healing center program Goals: Patient/caregiver will verbalize understanding of the [place] Date Initiated: 04/14/2017 Target Resolution Date: 04/21/2017 Goal Status: Active Interventions: Provide education on orientation to the wound center Notes: ` Wound/Skin Impairment Nursing Diagnoses: Impaired tissue integrity GoalsHIBAH, Teresa Krueger ([id number]) Ulcer/skin breakdown will heal within 14 weeks Date Initiated: 04/14/2017 Target Resolution Date: 08/04/2017 Goal Status: Active Interventions: Assess ulceration(s) every visit Treatment Activities: Referred to DME [place] for dressing supplies : 04/14/2017 Notes: Electronic Signature(s) Signed: 05/20/2017 4:40:58 PM By: Roger Shelter Entered By: Roger Shelter on 05/19/2017 11:16:35 Teresa Krueger ([id number]) -------------------------------------------------------------------------------- Pain Assessment Details Patient Name: Teresa Krueger Date of Service: 05/19/2017 11:00 AM Medical Record Number: [id number] Patient Account Number: [id number] Date of Birth/Sex: Teresa [birth year] (83 y.o. Female) Treating RN: Roger Shelter Primary Care  Mykiah Schmuck: Ramonita Lab Other Clinician: Referring Erlin Gardella: Ramonita Lab Treating Jobina Maita/Extender: Frann Rider in Treatment: 5 Active Problems Location of Pain Severity and Description of Pain Patient Has Paino No Site Locations Pain Management and Medication Current Pain Management: Electronic Signature(s) Signed:  05/20/2017 4:40:58 PM By: Roger Shelter Entered By: Roger Shelter on 05/19/2017 11:05:04 Teresa Krueger ([id number]) -------------------------------------------------------------------------------- Patient/Caregiver Education Details Patient Name: Teresa Krueger Date of Service: 05/19/2017 11:00 AM Medical Record Number: [id number] Patient Account Number: [id number] Date of Birth/Gender: Teresa Krue[birth year] (83 y.o. Female) Treating RN: Roger Shelter Primary Care Physician: Ramonita Lab Other Clinician: Referring Physician: Ramonita Lab Treating Physician/Extender: Frann Rider in Treatment: 5 Education Assessment Education Provided To: Patient Education Topics Provided Wound Debridement: Handouts: Wound Debridement Methods: Explain/Verbal Responses: State content correctly Wound/Skin Impairment: Methods: Explain/Verbal Responses: State content correctly Electronic Signature(s) Signed: 05/20/2017 4:40:58 PM By: Roger Shelter Entered By: Roger Shelter on 05/19/2017 11:47:57 Teresa Krueger ([id number]) -------------------------------------------------------------------------------- Wound Assessment Details Patient Name: Teresa Krueger Date of Service: 05/19/2017 11:00 AM Medical Record Number: [id number] Patient Account Number: [id number] Date of Birth/Sex: Teresa Krueger, Teresa Krueger (83 y.o. Female) Treating RN: Roger Shelter Primary Care Shahab Polhamus: Ramonita Lab Other Clinician: Referring Nirvan Laban: Ramonita Lab Treating Feiga Nadel/Extender: Frann Rider in Treatment: 5 Wound Status Wound Number: 1 Primary Trauma, Other Etiology: Wound Location: Left Lower Leg - Medial Secondary Diabetic Wound/Ulcer of the Lower Extremity Wounding Event: Trauma Etiology: Date Acquired: 04/03/2017 Wound Open Weeks Of Treatment: 5 Status: Clustered Wound: No Comorbid Cataracts, Chronic Obstructive Pulmonary History: Disease (COPD), Sleep Apnea, Hypertension, Type II  Diabetes, Gout, Osteoarthritis, Neuropathy, Received Chemotherapy Photos Wound Measurements Length: (cm) 0.5 Width: (cm) 0.9 Depth: (cm) 0.2 Area: (cm) 0.353 Volume: (cm) 0.071 % Reduction in Area: 94.6% % Reduction in Volume: 89% Epithelialization: Large (67-100%) Tunneling: No Undermining: No Wound Description Full Thickness Without Exposed Support Foul Teresa Krueger: Structures Slough/F Wound Margin: Distinct, outline attached Exudate Medium Amount: Exudate Type: Serous Exudate Color: amber r After Cleansing: No ibrino No Wound Bed Granulation Amount: Small (1-33%) Exposed Structure Granulation Quality: Red Fascia Exposed: No Necrotic Amount: Large (67-100%) Fat Layer (Subcutaneous Tissue) Exposed: Yes Necrotic Quality: Eschar Tendon Exposed: No Muscle Exposed: No Joint Exposed: No Bone Exposed: No Teresa Krueger, Teresa G. ([id number]) Periwound Skin Texture Texture Color No Abnormalities Noted: No No Abnormalities Noted: No Callus: No Atrophie Blanche: No Crepitus: No Cyanosis: No Excoriation: No Ecchymosis: No Induration: No Erythema: No Rash: No Hemosiderin Staining: No Scarring: Yes Mottled: No Pallor: No Moisture Rubor: Yes No Abnormalities Noted: No Dry / Scaly: Yes Temperature / Pain Maceration: No Temperature: No Abnormality Tenderness on Palpation: Yes Wound Preparation Ulcer Cleansing: Rinsed/Irrigated with Saline Topical Anesthetic Applied: Other: lidocaine 4%, Treatment Notes Wound #1 (Left, Medial Lower Leg) 1. Cleansed with: Clean wound with Normal Saline 2. Anesthetic Topical Lidocaine 4% cream to wound bed prior to debridement 4. Dressing Applied: Prisma Ag 5. Secondary Dressing Applied ABD Pad Notes gauze wrap Electronic Signature(s) Signed: 05/20/2017 10:20:56 AM By: Gretta Cool, BSN, RN, CWS, Kim RN, BSN Signed: 05/20/2017 4:40:58 PM By: Roger Shelter Entered By: Gretta Cool BSN, RN, CWS, Kim on 05/20/2017  10:20:55 Teresa Krueger ([id number]) -------------------------------------------------------------------------------- [place] Details Patient Name: Teresa Krueger Date of Service: 05/19/2017 11:00 AM Medical Record Number: [id number] Patient Account Number: [id number] Date of Birth/Sex: [birth year] Krueger (83 y.o. Female) Treating RN: Roger Shelter Primary Care Layal Javid: Ramonita Lab Other Clinician: Referring Pinkie Manger: Ramonita Lab Treating Mekhi Lascola/Extender: Frann Rider in Treatment: 5 Vital Signs Time Taken: 11:05 Temperature (F): 97.9  Height (in): 60 Pulse (bpm): 64 Weight (lbs): 251 Respiratory Rate (breaths/min): 16 Body Mass Index (BMI): 49 Blood Pressure (mmHg): 145/40 Reference Range: 80 - 120 mg / dl Electronic Signature(s) Signed: 05/20/2017 4:40:58 PM By: Roger Shelter Entered By: Roger Shelter on 05/19/2017 11:07:54

## 2017-05-23 NOTE — Progress Notes (Signed)
FAREEHA, EVON ([id number]) Visit Report for 05/19/2017 Chief Complaint Document Details Patient Name: Teresa Krueger, Teresa Krueger Date of Service: 05/19/2017 11:00 AM Medical Record Number: [id number] Patient Account Number: [id number] Date of Birth/Sex: 27-Feb-1934 (83 y.o. Female) Treating RN: Roger Shelter Primary Care Provider: Ramonita Lab Other Clinician: Referring Provider: Ramonita Lab Treating Provider/Extender: Frann Rider in Treatment: 5 Information Obtained from: Patient Chief Complaint Patients presents for treatment of an open diabetic ulcer to the left lower extremity caused by trauma 2 weeks ago Electronic Signature(s) Signed: 05/19/2017 11:52:56 AM By: Christin Fudge MD, FACS Entered By: Christin Fudge on 05/19/2017 11:52:56 Teresa Krueger ([id number]) -------------------------------------------------------------------------------- Debridement Details Patient Name: Teresa Krueger Date of Service: 05/19/2017 11:00 AM Medical Record Number: [id number] Patient Account Number: [id number] Date of Birth/Sex: [birth year] (83 y.o. Female) Treating RN: Roger Shelter Primary Care Provider: Ramonita Lab Other Clinician: Referring Provider: Ramonita Lab Treating Provider/Extender: Frann Rider in Treatment: 5 Debridement Performed for Wound #1 Left,Medial Lower Leg Assessment: Performed By: Physician Christin Fudge, MD Debridement: Debridement Severity of Tissue Pre Fat layer exposed Debridement: Pre-procedure Verification/Time Yes - 11:30 Out Taken: Start Time: 11:32 Pain Control: Other : lidocaine 4% Level: Skin/Subcutaneous Tissue Total Area Debrided (L x W): 0.5 (cm) x 0.9 (cm) = 0.45 (cm) Tissue and other material Viable, Non-Viable, Eschar, Skin, Subcutaneous debrided: Instrument: Forceps Bleeding: Minimum Hemostasis Achieved: Pressure End Time: 11:33 Procedural Pain: 0 Post Procedural Pain: 0 Response to Treatment: Procedure was tolerated  well Post Debridement Measurements of Total Wound Length: (cm) 0.5 Width: (cm) 0.9 Depth: (cm) 0.2 Volume: (cm) 0.071 Character of Wound/Ulcer Post Debridement: Stable Severity of Tissue Post Debridement: Fat layer exposed Post Procedure Diagnosis Same as Pre-procedure Electronic Signature(s) Signed: 05/19/2017 11:52:50 AM By: Christin Fudge MD, FACS Signed: 05/20/2017 4:40:58 PM By: Roger Shelter Entered By: Christin Fudge on 05/19/2017 11:52:50 Teresa Krueger ([id number]) -------------------------------------------------------------------------------- HPI Details Patient Name: Teresa Krueger Date of Service: 05/19/2017 11:00 AM Medical Record Number: [id number] Patient Account Number: [id number] Date of Birth/Sex: 10-Jan-1934 (83 y.o. Female) Treating RN: Roger Shelter Primary Care Provider: Ramonita Lab Other Clinician: Referring Provider: Ramonita Lab Treating Provider/Extender: Frann Rider in Treatment: 5 History of Present Illness Location: left lower extremity Quality: Patient reports experiencing a sharp pain to affected area(s). Severity: Patient states wound are getting worse. Duration: Patient has had the wound for < 2 weeks prior to presenting for treatment Timing: Pain in wound is Intermittent (comes and goes Context: The wound occurred when the patient had a blunt injury to the left lower extremity which caused a large hematoma Modifying Factors: Other treatment(s) tried include:has been put on oral antibiotics by her PCP Associated Signs and Symptoms: Patient reports having increase swelling. HPI Description: 83 year old patient, known to be a diabetic was recently seen by her PCP regarding an injury to the left lower extremity which she has had about 2 weeks. She injured it using an electric wheelchair and hitting the ramp on her Lucianne Lei and had an x-ray in the ER which was negative for any fracture. The patient however says she has not had any x-ray  done in the ER. I did not find any x-ray reported in her electronic medical record. She has minimal drainage from the wound and it was covered with eschar. Past medical history significant for breast cancer, COPD, diabetes mellitus type 2, essential hypertension, morbid obesity, status post cardiac pacemaker, cholecystectomy, mastectomy, EGD for Barrett's esophagus. Patient was given doxycycline for 10 days and referred to the  wound center. The patient has had a bilateral lower extremity venous reflux examination done in February 2018 and it was showing no venous incompetence bilaterally. 04/21/2017 -- after review of her wound today I have recommended a snap vacuum system and we'll try and get this authorized by her insurance. 05/05/2017 -- the snap vacuum applied last week lasted of 5 days before the canister got full. The wound is doing very well though 05/12/2017 -- she has been doing very well without the snap vacuum and local care has been helping her. Electronic Signature(s) Signed: 05/19/2017 11:53:00 AM By: Christin Fudge MD, FACS Entered By: Christin Fudge on 05/19/2017 11:53:00 Teresa Krueger ([id number]) -------------------------------------------------------------------------------- Physical Exam Details Patient Name: Teresa Krueger Date of Service: 05/19/2017 11:00 AM Medical Record Number: [id number] Patient Account Number: [id number] Date of Birth/Sex: 1934/03/22 (83 y.o. Female) Treating RN: Roger Shelter Primary Care Provider: Ramonita Lab Other Clinician: Referring Provider: Ramonita Lab Treating Provider/Extender: Frann Rider in Treatment: 5 Constitutional . Pulse regular. Respirations normal and unlabored. Afebrile. . Eyes Nonicteric. Reactive to light. Ears, Nose, Mouth, and Throat Lips, teeth, and gums WNL.Marland Kitchen Moist mucosa without lesions. Neck supple and nontender. No palpable supraclavicular or cervical adenopathy. Normal sized without  goiter. Respiratory WNL. No retractions.. Cardiovascular Pedal Pulses WNL. No clubbing, cyanosis or edema. Lymphatic No adneopathy. No adenopathy. No adenopathy. Musculoskeletal Adexa without tenderness or enlargement.. Digits and nails w/o clubbing, cyanosis, infection, petechiae, ischemia, or inflammatory conditions.. Integumentary (Hair, Skin) No suspicious lesions. No crepitus or fluctuance. No peri-wound warmth or erythema. No masses.Marland Kitchen Psychiatric Judgement and insight Intact.. No evidence of depression, anxiety, or agitation.. Notes the wound was sharply debrided with a toothed forcep and a lot of subcutaneous and his debris was removed and the bases looking healthy. No significant bleeding. Electronic Signature(s) Signed: 05/19/2017 11:53:45 AM By: Christin Fudge MD, FACS Entered By: Christin Fudge on 05/19/2017 11:53:44 Teresa Krueger ([id number]) -------------------------------------------------------------------------------- Physician Orders Details Patient Name: Teresa Krueger Date of Service: 05/19/2017 11:00 AM Medical Record Number: [id number] Patient Account Number: [id number] Date of Birth/Sex: 06/23/1934 (83 y.o. Female) Treating RN: Roger Shelter Primary Care Provider: Ramonita Lab Other Clinician: Referring Provider: Ramonita Lab Treating Provider/Extender: Frann Rider in Treatment: 5 Verbal / Phone Orders: No Diagnosis Coding Wound Cleansing Wound #1 Left,Medial Lower Leg o Clean wound with Normal Saline. o May Shower, gently pat wound dry prior to applying new dressing. Anesthetic Wound #1 Left,Medial Lower Leg o Topical Lidocaine 4% cream applied to wound bed prior to debridement Primary Wound Dressing Wound #1 Left,Medial Lower Leg o Prisma Ag Secondary Dressing o ABD pad o Other - kerlix Dressing Change Frequency Wound #1 Left,Medial Lower Leg o Three times weekly Follow-up Appointments Wound #1 Left,Medial Lower  Leg o Return Appointment in 1 week. o Nurse Visit as needed Edema Control Wound #1 Left,Medial Lower Leg o Elevate legs to the level of the heart and pump ankles as often as possible Electronic Signature(s) Signed: 05/19/2017 12:10:21 PM By: Christin Fudge MD, FACS Signed: 05/20/2017 4:40:58 PM By: Roger Shelter Entered By: Roger Shelter on 05/19/2017 11:35:04 Teresa Krueger ([id number]) -------------------------------------------------------------------------------- Problem List Details Patient Name: Teresa Krueger Date of Service: 05/19/2017 11:00 AM Medical Record Number: [id number] Patient Account Number: [id number] Date of Birth/Sex: 07-May-1934 (83 y.o. Female) Treating RN: Roger Shelter Primary Care Provider: Ramonita Lab Other Clinician: Referring Provider: Ramonita Lab Treating Provider/Extender: Frann Rider in Treatment: 5 Active Problems ICD-10 Encounter Code Description Active Date Diagnosis E11.622 Type 2  diabetes mellitus with other skin ulcer 04/14/2017 Yes L97.222 Non-pressure chronic ulcer of left calf with fat layer exposed 04/14/2017 Yes S81.812A Laceration without foreign body, left lower leg, initial encounter 04/14/2017 Yes M70.962 Unspecified soft tissue disorder related to use, overuse and 04/14/2017 Yes pressure, left lower leg E66.01 Morbid (severe) obesity due to excess calories 04/14/2017 Yes Z99.3 Dependence on wheelchair 04/14/2017 Yes Inactive Problems Resolved Problems Electronic Signature(s) Signed: 05/19/2017 11:52:36 AM By: Christin Fudge MD, FACS Entered By: Christin Fudge on 05/19/2017 11:52:36 Teresa Krueger ([id number]) -------------------------------------------------------------------------------- Progress Note Details Patient Name: Teresa Krueger Date of Service: 05/19/2017 11:00 AM Medical Record Number: [id number] Patient Account Number: [id number] Date of Birth/Sex: 31-Oct-1933 (83 y.o.  Female) Treating RN: Roger Shelter Primary Care Provider: Ramonita Lab Other Clinician: Referring Provider: Ramonita Lab Treating Provider/Extender: Frann Rider in Treatment: 5 Subjective Chief Complaint Information obtained from Patient Patients presents for treatment of an open diabetic ulcer to the left lower extremity caused by trauma 2 weeks ago History of Present Illness (HPI) The following HPI elements were documented for the patient's wound: Location: left lower extremity Quality: Patient reports experiencing a sharp pain to affected area(s). Severity: Patient states wound are getting worse. Duration: Patient has had the wound for < 2 weeks prior to presenting for treatment Timing: Pain in wound is Intermittent (comes and goes Context: The wound occurred when the patient had a blunt injury to the left lower extremity which caused a large hematoma Modifying Factors: Other treatment(s) tried include:has been put on oral antibiotics by her PCP Associated Signs and Symptoms: Patient reports having increase swelling. 83 year old patient, known to be a diabetic was recently seen by her PCP regarding an injury to the left lower extremity which she has had about 2 weeks. She injured it using an electric wheelchair and hitting the ramp on her [place] Lei and had an x-ray in the ER which was negative for any fracture. The patient however says she has not had any x-ray done in the ER. I did not find any x-ray reported in her electronic medical record. She has minimal drainage from the wound and it was covered with eschar. Past medical history significant for breast cancer, COPD, diabetes mellitus type 2, essential hypertension, morbid obesity, status post cardiac pacemaker, cholecystectomy, mastectomy, EGD for Barrett's esophagus. Patient was given doxycycline for 10 days and referred to the wound center. The patient has had a bilateral lower extremity venous reflux examination done in  February 2018 and it was showing no venous incompetence bilaterally. 04/21/2017 -- after review of her wound today I have recommended a snap vacuum system and we'll try and get this authorized by her insurance. 05/05/2017 -- the snap vacuum applied last week lasted of 5 days before the canister got full. The wound is doing very well though 05/12/2017 -- she has been doing very well without the snap vacuum and local care has been helping her. Patient History Information obtained from Patient. Family History Diabetes - Paternal Grandparents, Heart Disease - [place] Grandparents, Hypertension - Mother, No family history of Cancer, Kidney Disease, Lung Disease, Seizures, Stroke, Thyroid Problems, Tuberculosis. Social History Former smoker - Quit 1994, Marital Status - Married, Alcohol Use - Never, Drug Use - No History, Caffeine Use - Never. Teresa Krueger, Teresa Krueger ([id number]) Medical And Surgical History Notes Oncologic Breast Cancer 1999 Objective Constitutional Pulse regular. Respirations normal and unlabored. Afebrile. Vitals Time Taken: 11:05 AM, Height: 60 in, Weight: 251 lbs, BMI: 49, Temperature: 97.9 F, Pulse: 64  bpm, Respiratory Rate: 16 breaths/min, Blood Pressure: 145/40 mmHg. Eyes Nonicteric. Reactive to light. Ears, Nose, Mouth, and Throat Lips, teeth, and gums WNL.Marland Kitchen Moist mucosa without lesions. Neck supple and nontender. No palpable supraclavicular or cervical adenopathy. Normal sized without goiter. Respiratory WNL. No retractions.. Cardiovascular Pedal Pulses WNL. No clubbing, cyanosis or edema. Lymphatic No adneopathy. No adenopathy. No adenopathy. Musculoskeletal Adexa without tenderness or enlargement.. Digits and nails w/o clubbing, cyanosis, infection, petechiae, ischemia, or inflammatory conditions.Marland Kitchen Psychiatric Judgement and insight Intact.. No evidence of depression, anxiety, or agitation.. General Notes: the wound was  sharply debrided with a toothed forcep and a lot of subcutaneous and his debris was removed and the bases looking healthy. No significant bleeding. Integumentary (Hair, Skin) No suspicious lesions. No crepitus or fluctuance. No peri-wound warmth or erythema. No masses.. Wound #1 status is Open. Original cause of wound was Trauma. The wound is located on the Left,Medial Lower Leg. The wound measures 0.5cm length x 0.9cm width x 0.2cm depth; 0.353cm^2 area and 0.071cm^3 volume. There is Fat Layer (Subcutaneous Tissue) Exposed exposed. There is no tunneling or undermining noted. There is a medium amount of serous drainage noted. The wound margin is distinct with the outline attached to the wound base. There is small (1-33%) red granulation within the wound bed. There is a large (67-100%) amount of necrotic tissue within the wound bed including Eick, Persephonie G. ([id number]) Eschar. The periwound skin appearance exhibited: Scarring, Dry/Scaly, Rubor. The periwound skin appearance did not exhibit: Callus, Crepitus, Excoriation, Induration, Rash, Maceration, Atrophie Blanche, Cyanosis, Ecchymosis, Hemosiderin Staining, Mottled, Pallor, Erythema. Periwound temperature was noted as No Abnormality. The periwound has tenderness on palpation. Assessment Active Problems ICD-10 E11.622 - Type 2 diabetes mellitus with other skin ulcer L97.222 - Non-pressure chronic ulcer of left calf with fat layer exposed S81.812A - Laceration without foreign body, left lower leg, initial encounter M70.962 - Unspecified soft tissue disorder related to use, overuse and pressure, left lower leg E66.01 - Morbid (severe) obesity due to excess calories Z99.3 - Dependence on wheelchair Procedures Wound #1 Pre-procedure diagnosis of Wound #1 is a Trauma, Other located on the Left,Medial Lower Leg .Severity of Tissue Pre Debridement is: Fat layer exposed. There was a Skin/Subcutaneous Tissue Debridement ([id number])  debridement with total area of 0.45 sq cm performed by Christin Fudge, MD. with the following instrument(s): Forceps to remove Viable and Non- Viable tissue/material including Eschar, Skin, and Subcutaneous after achieving pain control using Other (lidocaine 4%). A time out was conducted at 11:30, prior to the start of the procedure. A Minimum amount of bleeding was controlled with Pressure. The procedure was tolerated well with a pain level of 0 throughout and a pain level of 0 following the procedure. Post Debridement Measurements: 0.5cm length x 0.9cm width x 0.2cm depth; 0.071cm^3 volume. Character of Wound/Ulcer Post Debridement is stable. Severity of Tissue Post Debridement is: Fat layer exposed. Post procedure Diagnosis Wound #1: Same as Pre-Procedure Plan Wound Cleansing: Wound #1 Left,Medial Lower Leg: Clean wound with Normal Saline. May Shower, gently pat wound dry prior to applying new dressing. Anesthetic: Wound #1 Left,Medial Lower Leg: Topical Lidocaine 4% cream applied to wound bed prior to debridement Primary Wound Dressing: Wound #1 Left,Medial Lower Leg: Prisma Ag Secondary Dressing: ABD pad Other - kerlix Teresa Krueger, Teresa Krueger ([id number]) Dressing Change Frequency: Wound #1 Left,Medial Lower Leg: Three times weekly Follow-up Appointments: Wound #1 Left,Medial Lower Leg: Return Appointment in 1 week. Nurse Visit as needed Edema Control: Wound #1 Left,Medial Lower Leg:  Elevate legs to the level of the heart and pump ankles as often as possible after Sharp debridement and a thorough review today, I have recommended: 1. Washing this area with soap and water and packing it with a silver collagen. This can be done every other day 2. Elevation and exercise 3. Adequate protein, vitamin A, vitamin C and zinc She and her husband have had all questions answered to their satisfaction. Electronic Signature(s) Signed: 05/20/2017 12:54:05 PM By: Gretta Cool, BSN, RN, CWS, Kim RN,  BSN Signed: 05/22/2017 4:36:05 PM By: Christin Fudge MD, FACS Previous Signature: 05/19/2017 11:54:43 AM Version By: Christin Fudge MD, FACS Entered By: Gretta Cool BSN, RN, CWS, Kim on 05/20/2017 12:54:05 Teresa Krueger ([id number]) -------------------------------------------------------------------------------- ROS/PFSH Details Patient Name: Teresa Krueger Date of Service: 05/19/2017 11:00 AM Medical Record Number: [id number] Patient Account Number: [id number] Date of Birth/Sex: 1934/01/20 (83 y.o. Female) Treating RN: Roger Shelter Primary Care Provider: Ramonita Lab Other Clinician: Referring Provider: Ramonita Lab Treating Provider/Extender: Frann Rider in Treatment: 5 Information Obtained From Patient Wound History Do you currently have one or more open woundso Yes How many open wounds do you currently haveo 1 Approximately how long have you had your woundso 2 weeks How have you been treating your wound(s) until nowo vasaline Has your wound(s) ever healed and then re-openedo No Have you had any lab work done in the past montho No Have you tested positive for an antibiotic resistant organism (MRSA, VRE)o No Have you tested positive for osteomyelitis (bone infection)o No Have you had any tests for circulation on your legso No Eyes Medical History: Positive for: Cataracts - Removed Negative for: Glaucoma; Optic Neuritis Ear/Nose/Mouth/Throat Medical History: Negative for: Chronic sinus problems/congestion; Middle ear problems Hematologic/Lymphatic Medical History: Negative for: Anemia; Hemophilia; Human Immunodeficiency Virus; Lymphedema; Sickle Cell Disease Respiratory Medical History: Positive for: Chronic Obstructive Pulmonary Disease (COPD); Sleep Apnea Negative for: Aspiration; Asthma; Pneumothorax; Tuberculosis Cardiovascular Medical History: Positive for: Hypertension Negative for: Angina; Arrhythmia; Congestive Heart Failure; Coronary Artery Disease;  Deep Vein Thrombosis; Hypotension; Myocardial Infarction; Peripheral Arterial Disease; Peripheral Venous Disease; Phlebitis; Vasculitis Gastrointestinal Medical History: Negative for: Cirrhosis ; Colitis; Crohnos; Hepatitis A; Hepatitis B; Hepatitis C Endocrine Teresa Krueger, Teresa Krueger ([id number]) Medical History: Positive for: Type II Diabetes Negative for: Type I Diabetes Time with diabetes: 20 plus years Treated with: Insulin Blood sugar tested every day: Yes Tested : 3 Blood sugar testing results: Breakfast: 92; Bedtime: 120 Genitourinary Medical History: Negative for: End Stage Renal Disease Immunological Medical History: Negative for: Lupus Erythematosus; Raynaudos; Scleroderma Integumentary (Skin) Medical History: Negative for: History of Burn; History of pressure wounds Musculoskeletal Medical History: Positive for: Gout; Osteoarthritis Negative for: Rheumatoid Arthritis; Osteomyelitis Neurologic Medical History: Positive for: Neuropathy - Hands and feet Negative for: Dementia; Quadriplegia; Paraplegia; Seizure Disorder Oncologic Medical History: Positive for: Received Chemotherapy Negative for: Received Radiation Past Medical History Notes: Breast Cancer 1999 HBO Extended History Items Eyes: Cataracts Immunizations Pneumococcal Vaccine: Received Pneumococcal Vaccination: Yes Implantable Devices Family and Social History Cancer: No; Diabetes: Yes - Paternal Grandparents; Heart Disease: Yes - Mother,Father,Maternal Grandparents,Paternal Grandparents; Hypertension: Yes - Mother; Kidney Disease: No; Lung Disease: No; Seizures: No; Stroke: No; Thyroid Problems: No; Tuberculosis: No; Former smoker - Quit 1994; Marital Status - Married; Alcohol Use: Never; Drug Use: No Teresa Krueger, Teresa Krueger. ([id number]) History; Caffeine Use: Never; Advanced Directives: Yes (Not Provided); Patient does not want information on Advanced Directives; Living Will: Yes (Not Provided); Medical  Power of Attorney: Yes (Not Provided) Physician Affirmation I have  reviewed and agree with the above information. Electronic Signature(s) Signed: 05/19/2017 12:10:21 PM By: Christin Fudge MD, FACS Signed: 05/20/2017 4:40:58 PM By: Roger Shelter Entered By: Christin Fudge on 05/19/2017 11:53:08 Teresa Krueger ([id number]) -------------------------------------------------------------------------------- SuperBill Details Patient Name: Teresa Krueger Date of Service: 05/19/2017 Medical Record Number: [id number] Patient Account Number: [id number] Date of Birth/Sex: 1933/08/22 (83 y.o. Female) Treating RN: Roger Shelter Primary Care Provider: Ramonita Lab Other Clinician: Referring Provider: Ramonita Lab Treating Provider/Extender: Frann Rider in Treatment: 5 Diagnosis Coding ICD-10 Codes Code Description E11.622 Type 2 diabetes mellitus with other skin ulcer L97.222 Non-pressure chronic ulcer of left calf with fat layer exposed S81.812A Laceration without foreign body, left lower leg, initial encounter M70.962 Unspecified soft tissue disorder related to use, overuse and pressure, left lower leg E66.01 Morbid (severe) obesity due to excess calories Z99.3 Dependence on wheelchair Facility Procedures CPT4 Code Description: [id number] 11042 - DEB SUBQ TISSUE 20 SQ CM/< ICD-10 Diagnosis Description E11.622 Type 2 diabetes mellitus with other skin ulcer L97.222 Non-pressure chronic ulcer of left calf with fat layer exposed S81.812A Laceration without  foreign body, left lower leg, initial encou M70.962 Unspecified soft tissue disorder related to use, overuse and p Modifier: nter ressure, left low Quantity: 1 er leg Physician Procedures CPT4 Code Description: [id number] 40375 - WC PHYS SUBQ TISS 20 SQ CM ICD-10 Diagnosis Description E11.622 Type 2 diabetes mellitus with other skin ulcer L97.222 Non-pressure chronic ulcer of left calf with fat layer exposed S81.812A Laceration without   foreign body, left lower leg, initial encoun M70.962 Unspecified soft tissue disorder related to use, overuse and pr Modifier: ter essure, left low Quantity: 1 er leg Electronic Signature(s) Signed: 05/19/2017 11:55:01 AM By: Christin Fudge MD, FACS Entered By: Christin Fudge on 05/19/2017 11:55:01

## 2017-05-26 ENCOUNTER — Encounter: Payer: Medicare Other | Attending: Physician Assistant | Admitting: Physician Assistant

## 2017-05-26 DIAGNOSIS — Z87891 Personal history of nicotine dependence: Secondary | ICD-10-CM | POA: Diagnosis not present

## 2017-05-26 DIAGNOSIS — M1990 Unspecified osteoarthritis, unspecified site: Secondary | ICD-10-CM | POA: Insufficient documentation

## 2017-05-26 DIAGNOSIS — M109 Gout, unspecified: Secondary | ICD-10-CM | POA: Insufficient documentation

## 2017-05-26 DIAGNOSIS — E11622 Type 2 diabetes mellitus with other skin ulcer: Secondary | ICD-10-CM | POA: Insufficient documentation

## 2017-05-26 DIAGNOSIS — Z794 Long term (current) use of insulin: Secondary | ICD-10-CM | POA: Insufficient documentation

## 2017-05-26 DIAGNOSIS — E1140 Type 2 diabetes mellitus with diabetic neuropathy, unspecified: Secondary | ICD-10-CM | POA: Insufficient documentation

## 2017-05-26 DIAGNOSIS — Z9049 Acquired absence of other specified parts of digestive tract: Secondary | ICD-10-CM | POA: Diagnosis not present

## 2017-05-26 DIAGNOSIS — S81812A Laceration without foreign body, left lower leg, initial encounter: Secondary | ICD-10-CM | POA: Insufficient documentation

## 2017-05-26 DIAGNOSIS — Z9221 Personal history of antineoplastic chemotherapy: Secondary | ICD-10-CM | POA: Insufficient documentation

## 2017-05-26 DIAGNOSIS — Z950 Presence of cardiac pacemaker: Secondary | ICD-10-CM | POA: Insufficient documentation

## 2017-05-26 DIAGNOSIS — K2270 Barrett's esophagus without dysplasia: Secondary | ICD-10-CM | POA: Diagnosis not present

## 2017-05-26 DIAGNOSIS — G4730 Sleep apnea, unspecified: Secondary | ICD-10-CM | POA: Insufficient documentation

## 2017-05-26 DIAGNOSIS — Z853 Personal history of malignant neoplasm of breast: Secondary | ICD-10-CM | POA: Insufficient documentation

## 2017-05-26 DIAGNOSIS — Z993 Dependence on wheelchair: Secondary | ICD-10-CM | POA: Diagnosis not present

## 2017-05-26 DIAGNOSIS — I10 Essential (primary) hypertension: Secondary | ICD-10-CM | POA: Diagnosis not present

## 2017-05-26 DIAGNOSIS — L97222 Non-pressure chronic ulcer of left calf with fat layer exposed: Secondary | ICD-10-CM | POA: Insufficient documentation

## 2017-05-26 DIAGNOSIS — J449 Chronic obstructive pulmonary disease, unspecified: Secondary | ICD-10-CM | POA: Diagnosis not present

## 2017-05-26 DIAGNOSIS — Z6841 Body Mass Index (BMI) 40.0 and over, adult: Secondary | ICD-10-CM | POA: Insufficient documentation

## 2017-05-26 DIAGNOSIS — X58XXXA Exposure to other specified factors, initial encounter: Secondary | ICD-10-CM | POA: Insufficient documentation

## 2017-05-26 DIAGNOSIS — M70962 Unspecified soft tissue disorder related to use, overuse and pressure, left lower leg: Secondary | ICD-10-CM | POA: Diagnosis not present

## 2017-05-27 NOTE — Progress Notes (Signed)
MLISS, WEDIN ([id number]) Visit Report for 05/26/2017 Arrival Information Details Patient Name: Teresa Krueger, Teresa Krueger Date of Service: 05/26/2017 11:00 AM Medical Record Number: [id number] Patient Account Number: [id number] Date of Birth/Sex: Feb 01, 1934 (83 y.o. Female) Treating RN: Roger Shelter Primary Care Lundyn Coste: Ramonita Lab Other Clinician: Referring Derell Bruun: Ramonita Lab Treating Phelicia Dantes/Extender: Melburn Hake, HOYT Weeks in Treatment: 6 Visit Information History Since Last Visit All ordered tests and consults were completed: No Patient Arrived: Wheel Chair Added or deleted any medications: No Arrival Time: 10:54 Any new allergies or adverse reactions: No Accompanied By: husband Had a fall or experienced change in No Transfer Assistance: EasyPivot Patient activities of daily living that may affect Lift risk of falls: Patient Identification Verified: Yes Signs or symptoms of abuse/neglect since last visito No Secondary Verification Process Yes Hospitalized since last visit: No Completed: Pain Present Now: No Patient Requires Transmission-Based No Precautions: Patient Has Alerts: Yes Patient Alerts: Patient on Blood Thinner Type II Diabetic Plavix Electronic Signature(s) Signed: 05/26/2017 4:14:11 PM By: Roger Shelter Entered By: Roger Shelter on 05/26/2017 10:55:08 Teresa Krueger ([id number]) -------------------------------------------------------------------------------- Clinic Level of Care Assessment Details Patient Name: Teresa Krueger Date of Service: 05/26/2017 11:00 AM Medical Record Number: [id number] Patient Account Number: [id number] Date of Birth/Sex: 11/22/1933 (83 y.o. Female) Treating RN: Roger Shelter Primary Care Nakeya Adinolfi: Ramonita Lab Other Clinician: Referring Gatlyn Lipari: Ramonita Lab Treating Aydrian Halpin/Extender: Melburn Hake, HOYT Weeks in Treatment: 6 Clinic Level of Care Assessment Items TOOL 4 Quantity Score []  - Use when only an  EandM is performed on FOLLOW-UP visit 0 ASSESSMENTS - Nursing Assessment / Reassessment []  - Reassessment of Co-morbidities (includes updates in patient status) 0 X- 1 5 Reassessment of Adherence to Treatment Plan ASSESSMENTS - Wound and Skin Assessment / Reassessment X - Simple Wound Assessment / Reassessment - one wound 1 5 []  - 0 Complex Wound Assessment / Reassessment - multiple wounds []  - 0 Dermatologic / Skin Assessment (not related to wound area) ASSESSMENTS - Focused Assessment []  - Circumferential Edema Measurements - multi extremities 0 []  - 0 Nutritional Assessment / Counseling / Intervention []  - 0 Lower Extremity Assessment (monofilament, tuning fork, pulses) []  - 0 Peripheral Arterial Disease Assessment (using hand held doppler) ASSESSMENTS - Ostomy and/or Continence Assessment and Care []  - Incontinence Assessment and Management 0 []  - 0 Ostomy Care Assessment and Management (repouching, etc.) PROCESS - Coordination of Care X - Simple Patient / Family Education for ongoing care 1 15 []  - 0 Complex (extensive) Patient / Family Education for ongoing care []  - 0 Staff obtains Programmer, systems, Records, Test Results / Process Orders []  - 0 Staff telephones HHA, Nursing Homes / Clarify orders / etc []  - 0 Routine Transfer to another Facility (non-emergent condition) []  - 0 Routine Hospital Admission (non-emergent condition) []  - 0 New Admissions / Biomedical engineer / Ordering NPWT, Apligraf, etc. []  - 0 Emergency Hospital Admission (emergent condition) X- 1 10 Simple Discharge Coordination Teresa, Krueger ([id number]) []  - 0 Complex (extensive) Discharge Coordination PROCESS - Special Needs []  - Pediatric / Minor Patient Management 0 []  - 0 Isolation Patient Management []  - 0 Hearing / Language / Visual special needs []  - 0 Assessment of Community assistance (transportation, D/C planning, etc.) []  - 0 Additional assistance / Altered mentation []  -  0 Support Surface(s) Assessment (bed, cushion, seat, etc.) INTERVENTIONS - Wound Cleansing / Measurement X - Simple Wound Cleansing - one wound 1 5 []  - 0 Complex Wound Cleansing - multiple wounds []  -  0 Wound Imaging (photographs - any number of wounds) []  - 0 Wound Tracing (instead of photographs) X- 1 5 Simple Wound Measurement - one wound []  - 0 Complex Wound Measurement - multiple wounds INTERVENTIONS - Wound Dressings X - Small Wound Dressing one or multiple wounds 1 10 []  - 0 Medium Wound Dressing one or multiple wounds []  - 0 Large Wound Dressing one or multiple wounds []  - 0 Application of Medications - topical []  - 0 Application of Medications - injection INTERVENTIONS - Miscellaneous []  - External ear exam 0 []  - 0 Specimen Collection (cultures, biopsies, blood, body fluids, etc.) []  - 0 Specimen(s) / Culture(s) sent or taken to Lab for analysis []  - 0 Patient Transfer (multiple staff / Civil Service fast streamer / Similar devices) []  - 0 Simple Staple / Suture removal (25 or less) []  - 0 Complex Staple / Suture removal (26 or more) []  - 0 Hypo / Hyperglycemic Management (close monitor of Blood Glucose) []  - 0 Ankle / Brachial Index (ABI) - do not check if billed separately X- 1 5 Vital Signs Centanni, Gusta G. ([id number]) Has the patient been seen at the hospital within the last three years: Yes Total Score: 60 Level Of Care: New/Established - Level 2 Electronic Signature(s) Signed: 05/26/2017 4:14:11 PM By: Roger Shelter Entered By: Roger Shelter on 05/26/2017 16:00:20 Teresa Krueger ([id number]) -------------------------------------------------------------------------------- Encounter Discharge Information Details Patient Name: Teresa Krueger Date of Service: 05/26/2017 11:00 AM Medical Record Number: [id number] Patient Account Number: [id number] Date of Birth/Sex: 07/05/1933 (83 y.o. Female) Treating RN: Roger Shelter Primary Care Jacari Kirsten: Ramonita Lab Other Clinician: Referring Gedalia Mcmillon: Ramonita Lab Treating Shahana Capes/Extender: Melburn Hake, HOYT Weeks in Treatment: 6 Encounter Discharge Information Items Discharge Pain Level: 0 Discharge Condition: Stable Ambulatory Status: Wheelchair Discharge Destination: Home Private Transportation: Auto Accompanied By: husband Schedule Follow-up Appointment: No Medication Reconciliation completed and provided No to Patient/Care Jacorie Ernsberger: Clinical Summary of Care: Electronic Signature(s) Signed: 05/26/2017 4:14:11 PM By: Roger Shelter Entered By: Roger Shelter on 05/26/2017 11:57:02 Teresa Krueger ([id number]) -------------------------------------------------------------------------------- Lower Extremity Assessment Details Patient Name: Teresa Krueger Date of Service: 05/26/2017 11:00 AM Medical Record Number: [id number] Patient Account Number: [id number] Date of Birth/Sex: October 09, 1933 (83 y.o. Female) Treating RN: Roger Shelter Primary Care Muzamil Harker: Ramonita Lab Other Clinician: Referring Chukwuemeka Artola: Ramonita Lab Treating Kingstin Heims/Extender: Melburn Hake, HOYT Weeks in Treatment: 6 Edema Assessment Assessed: [Left: No] [Right: No] Edema: [Left: Ye] [Right: s] Vascular Assessment Claudication: Claudication Assessment [Left:None] Pulses: Dorsalis Pedis Palpable: [Left:Yes] Posterior Tibial Extremity colors, hair growth, and conditions: Extremity Color: [Left:Normal] Hair Growth on Extremity: [Left:No] Temperature of Extremity: [Left:Cool] Capillary Refill: [Left:< 3 seconds] Electronic Signature(s) Signed: 05/26/2017 4:14:11 PM By: Roger Shelter Entered By: Roger Shelter on 05/26/2017 11:54:44 Teresa Krueger ([id number]) -------------------------------------------------------------------------------- Multi Wound Chart Details Patient Name: Teresa Krueger Date of Service: 05/26/2017 11:00 AM Medical Record Number: [id number] Patient Account Number:  [id number] Date of Birth/Sex: 05-Jan-1934 (83 y.o. Female) Treating RN: Roger Shelter Primary Care Suliman Termini: Ramonita Lab Other Clinician: Referring Rip Hawes: Ramonita Lab Treating Claudeen Leason/Extender: Melburn Hake, HOYT Weeks in Treatment: 6 Vital Signs Height(in): 60 Pulse(bpm): 106 Weight(lbs): 251 Blood Pressure(mmHg): 145/39 Body Mass Index(BMI): 49 Temperature(F): 98.2 Respiratory Rate 16 (breaths/min): Photos: [1:No Photos] [N/A:N/A] Wound Location: [1:Left Lower Leg - Medial] [N/A:N/A] Wounding Event: [1:Trauma] [N/A:N/A] Primary Etiology: [1:Trauma, Other] [N/A:N/A] Secondary Etiology: [1:Diabetic Wound/Ulcer of the N/A Lower Extremity] Comorbid History: [1:Cataracts, Chronic Obstructive N/A Pulmonary Disease (COPD), Sleep Apnea, Hypertension, Type II Diabetes, Gout, Osteoarthritis, Neuropathy, Received Chemotherapy]  Date Acquired: [1:04/03/2017] [N/A:N/A] Weeks of Treatment: [1:6] [N/A:N/A] Wound Status: [1:Healed - Epithelialized] [N/A:N/A] Measurements L x W x D [1:0x0x0] [N/A:N/A] (cm) Area (cm) : [1:0] [N/A:N/A] Volume (cm) : [1:0] [N/A:N/A] % Reduction in Area: [1:100.00%] [N/A:N/A] % Reduction in Volume: [1:100.00%] [N/A:N/A] Classification: [1:Full Thickness Without Exposed Support Structures] [N/A:N/A] Exudate Amount: [1:None Present] [N/A:N/A] Wound Margin: [1:Distinct, outline attached] [N/A:N/A] Granulation Amount: [1:None Present (0%)] [N/A:N/A] Necrotic Amount: [1:Large (67-100%)] [N/A:N/A] Exposed Structures: [1:Fat Layer (Subcutaneous Tissue) Exposed: Yes Fascia: No Tendon: No Muscle: No Joint: No Bone: No] [N/A:N/A] Epithelialization: [1:Large (67-100%)] [N/A:N/A] Periwound Skin Texture: [1:Scarring: Yes Excoriation: No] [N/A:N/A] Induration: No Callus: No Crepitus: No Rash: No Periwound Skin Moisture: Dry/Scaly: Yes N/A N/A Maceration: No Periwound Skin Color: Rubor: Yes N/A N/A Atrophie Blanche: No Cyanosis: No Ecchymosis: No Erythema:  No Hemosiderin Staining: No Mottled: No Pallor: No Temperature: No Abnormality N/A N/A Tenderness on Palpation: Yes N/A N/A Wound Preparation: Ulcer Cleansing: N/A N/A Rinsed/Irrigated with Saline Topical Anesthetic Applied: Other: lidocaine 4% Assessment Notes: skin tear on inner aspect of left N/A N/A lower leg when bandaid dressing was removed. measures approximately 2cm. Margarita Grizzle ordered xeroform and kerlix wrap Treatment Notes Electronic Signature(s) Signed: 05/26/2017 4:14:11 PM By: Roger Shelter Entered By: Roger Shelter on 05/26/2017 11:55:16 Teresa Krueger ([id number]) -------------------------------------------------------------------------------- [place] Details Patient Name: Teresa Krueger Date of Service: 05/26/2017 11:00 AM Medical Record Number: [id number] Patient Account Number: [id number] Date of Birth/Sex: Jun 10, 1934 (83 y.o. Female) Treating RN: Roger Shelter Primary Care Joandry Slagter: Ramonita Lab Other Clinician: Referring Cherree Conerly: Ramonita Lab Treating Jeanne Diefendorf/Extender: Melburn Hake, HOYT Weeks in Treatment: 6 Active Inactive Electronic Signature(s) Signed: 05/26/2017 4:14:11 PM By: Roger Shelter Entered By: Roger Shelter on 05/26/2017 11:55:01 Teresa Krueger ([id number]) -------------------------------------------------------------------------------- Pain Assessment Details Patient Name: Teresa Krueger Date of Service: 05/26/2017 11:00 AM Medical Record Number: [id number] Patient Account Number: [id number] Date of Birth/Sex: 07-Jun-1934 (83 y.o. Female) Treating RN: Roger Shelter Primary Care Dominika Losey: Ramonita Lab Other Clinician: Referring Tyyne Cliett: Ramonita Lab Treating Tarynn Garling/Extender: Melburn Hake, HOYT Weeks in Treatment: 6 Active Problems Location of Pain Severity and Description of Pain Patient Has Paino No Site Locations Pain Management and Medication Current Pain Management: Electronic  Signature(s) Signed: 05/26/2017 4:14:11 PM By: Roger Shelter Entered By: Roger Shelter on 05/26/2017 10:55:15 Teresa Krueger ([id number]) -------------------------------------------------------------------------------- Wound Assessment Details Patient Name: Teresa Krueger Date of Service: 05/26/2017 11:00 AM Medical Record Number: [id number] Patient Account Number: [id number] Date of Birth/Sex: [birth year] (83 y.o. Female) Treating RN: Roger Shelter Primary Care Vernica Wachtel: Ramonita Lab Other Clinician: Referring Ahliyah Nienow: Ramonita Lab Treating Reathel Turi/Extender: Melburn Hake, HOYT Weeks in Treatment: 6 Wound Status Wound Number: 1 Primary Trauma, Other Etiology: Wound Location: Left Lower Leg - Medial Secondary Diabetic Wound/Ulcer of the Lower Extremity Wounding Event: Trauma Etiology: Date Acquired: 04/03/2017 Wound Healed - Epithelialized Weeks Of Treatment: 6 Status: Clustered Wound: No Comorbid Cataracts, Chronic Obstructive Pulmonary History: Disease (COPD), Sleep Apnea, Hypertension, Type II Diabetes, Gout, Osteoarthritis, Neuropathy, Received Chemotherapy Wound Measurements Length: (cm) 0 % Re Width: (cm) 0 % Re Depth: (cm) 0 Epit Area: (cm) 0 Tun Volume: (cm) 0 Und duction in Area: 100% duction in Volume: 100% helialization: Large (67-100%) neling: No ermining: No Wound Description Full Thickness Without Exposed Support Foul Classification: Structures Slou Wound Margin: Distinct, outline attached Exudate None Present Amount: Odor After Cleansing: No gh/Fibrino No Wound Bed Granulation Amount: None Present (0%) Exposed Structure Necrotic Amount: Large (67-100%) Fascia Exposed: No Fat Layer (Subcutaneous Tissue) Exposed: Yes  Tendon Exposed: No Muscle Exposed: No Joint Exposed: No Bone Exposed: No Periwound Skin Texture Texture Color No Abnormalities Noted: No No Abnormalities Noted: No Callus: No Atrophie Blanche: No Crepitus:  No Cyanosis: No Excoriation: No Ecchymosis: No Induration: No Erythema: No Rash: No Hemosiderin Staining: No Scarring: Yes Mottled: No Pallor: No Moisture Rubor: Yes No Abnormalities Noted: No GREENLEE, ANCHETA ([id number]) Dry / Scaly: Yes Temperature / Pain Maceration: No Temperature: No Abnormality Tenderness on Palpation: Yes Wound Preparation Ulcer Cleansing: Rinsed/Irrigated with Saline Topical Anesthetic Applied: Other: lidocaine 4%, Assessment Notes skin tear on inner aspect of left lower leg when bandaid dressing was removed. measures approximately 2cm. Hoyt ordered xeroform and kerlix wrap Treatment Notes Wound #1 (Left, Medial Lower Leg) 1. Cleansed with: Clean wound with Normal Saline 2. Anesthetic Topical Lidocaine 4% cream to wound bed prior to debridement 4. Dressing Applied: Xeroform Notes gauze wrap, dressing due to skin tear. wound healed Electronic Signature(s) Signed: 05/26/2017 4:14:11 PM By: Roger Shelter Entered By: Roger Shelter on 05/26/2017 11:54:19 Teresa Krueger ([id number]) -------------------------------------------------------------------------------- [place] Details Patient Name: Teresa Krueger Date of Service: 05/26/2017 11:00 AM Medical Record Number: [id number] Patient Account Number: [id number] Date of Birth/Sex: December 19, 1933 (83 y.o. Female) Treating RN: Roger Shelter Primary Care Souleymane Saiki: Ramonita Lab Other Clinician: Referring Therron Sells: Ramonita Lab Treating Yacob Wilkerson/Extender: Melburn Hake, HOYT Weeks in Treatment: 6 Vital Signs Time Taken: 10:56 Temperature (F): 98.2 Height (in): 60 Pulse (bpm): 73 Weight (lbs): 251 Respiratory Rate (breaths/min): 16 Body Mass Index (BMI): 49 Blood Pressure (mmHg): 145/39 Reference Range: 80 - 120 mg / dl Electronic Signature(s) Signed: 05/26/2017 4:14:11 PM By: Roger Shelter Entered By: Roger Shelter on 05/26/2017 10:57:39

## 2017-05-27 NOTE — Progress Notes (Signed)
Teresa Krueger ([id number]) Visit Report for 05/26/2017 Chief Complaint Document Details Patient Name: Teresa Krueger Date of Service: 05/26/2017 11:00 AM Medical Record Number: [id number] Patient Account Number: [id number] Date of Birth/Sex: Apr 09, 1934 (83 y.o. Female) Treating RN: Roger Shelter Primary Care Provider: Ramonita Lab Other Clinician: Referring Provider: Ramonita Lab Treating Provider/Extender: Melburn Hake, Malikiah Debarr Weeks in Treatment: 6 Information Obtained from: Patient Chief Complaint Patients presents for treatment of an open diabetic ulcer to the left lower extremity caused by trauma Electronic Signature(s) Signed: 05/26/2017 5:27:57 PM By: Worthy Keeler PA-C Entered By: Worthy Keeler on 05/26/2017 10:59:12 Teresa Krueger ([id number]) -------------------------------------------------------------------------------- HPI Details Patient Name: Teresa Krueger Date of Service: 05/26/2017 11:00 AM Medical Record Number: [id number] Patient Account Number: [id number] Date of Birth/Sex: 1934/01/22 (83 y.o. Female) Treating RN: Roger Shelter Primary Care Provider: Ramonita Lab Other Clinician: Referring Provider: Ramonita Lab Treating Provider/Extender: Melburn Hake, Daley Mooradian Weeks in Treatment: 6 History of Present Illness Location: left lower extremity Quality: Patient reports experiencing a sharp pain to affected area(s). Severity: Patient states wound are getting worse. Duration: Patient has had the wound for < 2 weeks prior to presenting for treatment Timing: Pain in wound is Intermittent (comes and goes Context: The wound occurred when the patient had a blunt injury to the left lower extremity which caused a large hematoma Modifying Factors: Other treatment(s) tried include:has been put on oral antibiotics by her PCP Associated Signs and Symptoms: Patient reports having increase swelling. HPI Description: 83 year old patient, known to be a diabetic was recently  seen by her PCP regarding an injury to the left lower extremity which she has had about 2 weeks. She injured it using an electric wheelchair and hitting the ramp on her Lucianne Lei and had an x-ray in the ER which was negative for any fracture. The patient however says she has not had any x-ray done in the ER. I did not find any x-ray reported in her electronic medical record. She has minimal drainage from the wound and it was covered with eschar. Past medical history significant for breast cancer, COPD, diabetes mellitus type 2, essential hypertension, morbid obesity, status post cardiac pacemaker, cholecystectomy, mastectomy, EGD for Barrett's esophagus. Patient was given doxycycline for 10 days and referred to the wound center. The patient has had a bilateral lower extremity venous reflux examination done in February 2018 and it was showing no venous incompetence bilaterally. 04/21/2017 -- after review of her wound today I have recommended a snap vacuum system and we'll try and get this authorized by her insurance. 05/05/2017 -- the snap vacuum applied last week lasted of 5 days before the canister got full. The wound is doing very well though 05/12/2017 -- she has been doing very well without the snap vacuum and local care has been helping her. 05/26/17 on evaluation today patient appears to be doing extremely well in regard to her left lower extremity wound. In fact this appears to be completely healed at this point. She has been tolerating the dressing changes without complication. Unfortunately she did put a adhesive bandage on her wound to cover it before coming in today. This unfortunately did cause a slight skin tear on the medial aspect during removal of the bandage. Nonetheless overall this is extremely superficial and appears to be okay I think it will not be in problem for her to heal. No fevers, chills, nausea, or vomiting noted at this time. Electronic Signature(s) Signed: 05/26/2017  5:27:57 PM By: Worthy Keeler PA-C Entered  By: Worthy Keeler on 05/26/2017 11:52:55 Teresa Krueger ([id number]) -------------------------------------------------------------------------------- Physical Exam Details Patient Name: Teresa Krueger Date of Service: 05/26/2017 11:00 AM Medical Record Number: [id number] Patient Account Number: [id number] Date of Birth/Sex: [birth year] (83 y.o. Female) Treating RN: Roger Shelter Primary Care Provider: Ramonita Lab Other Clinician: Referring Provider: Ramonita Lab Treating Provider/Extender: Melburn Hake, Ziyana Morikawa Weeks in Treatment: 6 Constitutional Obese and well-hydrated in no acute distress. Respiratory normal breathing without difficulty. Psychiatric this patient is able to make decisions and demonstrates good insight into disease process. Alert and Oriented x 3. pleasant and cooperative. Notes Patient's wounds which we have been caring for her regarding appears Be completely healed which is excellent news. she does have a slight skin tear immediately from the removal of the bandage which she states she knows she should not of put on nonetheless this appears to be extremely superficial. Electronic Signature(s) Signed: 05/26/2017 5:27:57 PM By: Worthy Keeler PA-C Entered By: Worthy Keeler on 05/26/2017 11:54:00 Teresa Krueger ([id number]) -------------------------------------------------------------------------------- Physician Orders Details Patient Name: Teresa Krueger Date of Service: 05/26/2017 11:00 AM Medical Record Number: [id number] Patient Account Number: [id number] Date of Birth/Sex: March 18, 1934 (83 y.o. Female) Treating RN: Roger Shelter Primary Care Provider: Ramonita Lab Other Clinician: Referring Provider: Ramonita Lab Treating Provider/Extender: Melburn Hake, Jaimey Franchini Weeks in Treatment: 6 Verbal / Phone Orders: No Diagnosis Coding ICD-10 Coding Code Description E11.622 Type 2 diabetes mellitus with other skin  ulcer L97.222 Non-pressure chronic ulcer of left calf with fat layer exposed S81.812A Laceration without foreign body, left lower leg, initial encounter M70.962 Unspecified soft tissue disorder related to use, overuse and pressure, left lower leg E66.01 Morbid (severe) obesity due to excess calories Z99.3 Dependence on wheelchair Discharge From [place] o Discharge from [place] - treatment completed Electronic Signature(s) Signed: 05/26/2017 3:58:00 PM By: Roger Shelter Signed: 05/26/2017 5:27:57 PM By: Worthy Keeler PA-C Entered By: Roger Shelter on 05/26/2017 15:57:59 Teresa Krueger ([id number]) -------------------------------------------------------------------------------- Problem List Details Patient Name: Teresa Krueger Date of Service: 05/26/2017 11:00 AM Medical Record Number: [id number] Patient Account Number: [id number] Date of Birth/Sex: 05-17-1934 (83 y.o. Female) Treating RN: Roger Shelter Primary Care Provider: Ramonita Lab Other Clinician: Referring Provider: Ramonita Lab Treating Provider/Extender: Melburn Hake, Vaanya Shambaugh Weeks in Treatment: 6 Active Problems ICD-10 Encounter Code Description Active Date Diagnosis E11.622 Type 2 diabetes mellitus with other skin ulcer 04/14/2017 Yes L97.222 Non-pressure chronic ulcer of left calf with fat layer exposed 04/14/2017 Yes S81.812A Laceration without foreign body, left lower leg, initial encounter 04/14/2017 Yes M70.962 Unspecified soft tissue disorder related to use, overuse and 04/14/2017 Yes pressure, left lower leg E66.01 Morbid (severe) obesity due to excess calories 04/14/2017 Yes Z99.3 Dependence on wheelchair 04/14/2017 Yes Inactive Problems Resolved Problems Electronic Signature(s) Signed: 05/26/2017 5:27:57 PM By: Worthy Keeler PA-C Entered By: Worthy Keeler on 05/26/2017 11:32:49 Teresa Krueger  ([id number]) -------------------------------------------------------------------------------- Progress Note Details Patient Name: Teresa Krueger Date of Service: 05/26/2017 11:00 AM Medical Record Number: [id number] Patient Account Number: [id number] Date of Birth/Sex: 08-Aug-1933 (83 y.o. Female) Treating RN: Roger Shelter Primary Care Provider: Ramonita Lab Other Clinician: Referring Provider: Ramonita Lab Treating Provider/Extender: Melburn Hake, Tarena Gockley Weeks in Treatment: 6 Subjective Chief Complaint Information obtained from Patient Patients presents for treatment of an open diabetic ulcer to the left lower extremity caused by trauma History of Present Illness (HPI) The following HPI elements were documented for the patient's wound: Location: left lower extremity Quality: Patient reports experiencing a  sharp pain to affected area(s). Severity: Patient states wound are getting worse. Duration: Patient has had the wound for < 2 weeks prior to presenting for treatment Timing: Pain in wound is Intermittent (comes and goes Context: The wound occurred when the patient had a blunt injury to the left lower extremity which caused a large hematoma Modifying Factors: Other treatment(s) tried include:has been put on oral antibiotics by her PCP Associated Signs and Symptoms: Patient reports having increase swelling. 83 year old patient, known to be a diabetic was recently seen by her PCP regarding an injury to the left lower extremity which she has had about 2 weeks. She injured it using an electric wheelchair and hitting the ramp on her Lucianne Lei and had an x-ray in the ER which was negative for any fracture. The patient however says she has not had any x-ray done in the ER. I did not find any x-ray reported in her electronic medical record. She has minimal drainage from the wound and it was covered with eschar. Past medical history significant for breast cancer, COPD, diabetes mellitus type 2,  essential hypertension, morbid obesity, status post cardiac pacemaker, cholecystectomy, mastectomy, EGD for Barrett's esophagus. Patient was given doxycycline for 10 days and referred to the wound center. The patient has had a bilateral lower extremity venous reflux examination done in February 2018 and it was showing no venous incompetence bilaterally. 04/21/2017 -- after review of her wound today I have recommended a snap vacuum system and we'll try and get this authorized by her insurance. 05/05/2017 -- the snap vacuum applied last week lasted of 5 days before the canister got full. The wound is doing very well though 05/12/2017 -- she has been doing very well without the snap vacuum and local care has been helping her. 05/26/17 on evaluation today patient appears to be doing extremely well in regard to her left lower extremity wound. In fact this appears to be completely healed at this point. She has been tolerating the dressing changes without complication. Unfortunately she did put a adhesive bandage on her wound to cover it before coming in today. This unfortunately did cause a slight skin tear on the medial aspect during removal of the bandage. Nonetheless overall this is extremely superficial and appears to be okay I think it will not be in problem for her to heal. No fevers, chills, nausea, or vomiting noted at this time. Patient History Information obtained from Patient. Family History Diabetes - Paternal Grandparents, Heart Disease - Mother,Father,Maternal Philena, Obey ([id number]) Hypertension - Mother, No family history of Cancer, Kidney Disease, Lung Disease, Seizures, Stroke, Thyroid Problems, Tuberculosis. Social History Former smoker - Quit 1994, Marital Status - Married, Alcohol Use - Never, Drug Use - No History, Caffeine Use - Never. Medical And Surgical History Notes Oncologic Breast Cancer 1999 Review of Systems  (ROS) Constitutional Symptoms (General Health) Denies complaints or symptoms of Fever, Chills. Respiratory The patient has no complaints or symptoms. Objective Constitutional Obese and well-hydrated in no acute distress. Vitals Time Taken: 10:56 AM, Height: 60 in, Weight: 251 lbs, BMI: 49, Temperature: 98.2 F, Pulse: 73 bpm, Respiratory Rate: 16 breaths/min, Blood Pressure: 145/39 mmHg. Respiratory normal breathing without difficulty. Psychiatric this patient is able to make decisions and demonstrates good insight into disease process. Alert and Oriented x 3. pleasant and cooperative. General Notes: Patient's wounds which we have been caring for her regarding appears Be completely healed which is excellent news. she does have a slight skin tear immediately from  the removal of the bandage which she states she knows she should not of put on nonetheless this appears to be extremely superficial. Integumentary (Hair, Skin) Wound #1 status is Healed - Epithelialized. Original cause of wound was Trauma. The wound is located on the Left,Medial Lower Leg. The wound measures 0cm length x 0cm width x 0cm depth; 0cm^2 area and 0cm^3 volume. There is Fat Layer (Subcutaneous Tissue) Exposed exposed. There is no tunneling or undermining noted. There is a none present amount of drainage noted. The wound margin is distinct with the outline attached to the wound base. There is no granulation within the wound bed. There is a large (67-100%) amount of necrotic tissue within the wound bed. The periwound skin appearance exhibited: Scarring, Dry/Scaly, Rubor. The periwound skin appearance did not exhibit: Callus, Crepitus, Excoriation, Induration, Rash, Maceration, Atrophie Blanche, Cyanosis, Ecchymosis, Hemosiderin Staining, Mottled, Pallor, Erythema. Periwound temperature was noted as No Abnormality. The periwound has tenderness on palpation. General Notes: skin tear on inner aspect of left lower leg when  bandaid dressing was removed. measures approximately 2cm. Taelor Moncada ordered xeroform and kerlix wrap MARGURETTE, BRENER ([id number]) Assessment Active Problems ICD-10 E11.622 - Type 2 diabetes mellitus with other skin ulcer L97.222 - Non-pressure chronic ulcer of left calf with fat layer exposed S81.812A - Laceration without foreign body, left lower leg, initial encounter M70.962 - Unspecified soft tissue disorder related to use, overuse and pressure, left lower leg E66.01 - Morbid (severe) obesity due to excess calories Z99.3 - Dependence on wheelchair Plan Discharge From [facility] Services: Discharge from [place] discontinue wound care services although we are going to put a Xeroform dressing to cover the skin tear location at this point. I think this will do very well and prevent any sticking of the Kerlex to the wound bed while the skin tear heels. Patient is in agreement with the plan. We will not have a specific follow-up appointment for her although she has any problems she will let us know otherwise we will discontinue wound care services at this point. I'm pleased to her wound has healed she and her husband are pleased as well. Electronic Signature(s) Signed: 05/26/2017 5:27:57 PM By: Worthy Keeler PA-C Entered By: Worthy Keeler on 05/26/2017 17:26:48 Teresa Krueger ([id number]) -------------------------------------------------------------------------------- ROS/PFSH Details Patient Name: Teresa Krueger Date of Service: 05/26/2017 11:00 AM Medical Record Number: [id number] Patient Account Number: [id number] Date of Birth/Sex: 1933-12-23 (83 y.o. Female) Treating RN: Roger Shelter Primary Care Provider: Ramonita Lab Other Clinician: Referring Provider: Ramonita Lab Treating Provider/Extender: Melburn Hake, Albina Gosney Weeks in Treatment: 6 Information Obtained From Patient Wound History Do you currently have one or more open woundso  Yes How many open wounds do you currently haveo 1 Approximately how long have you had your woundso 2 weeks How have you been treating your wound(s) until nowo vasaline Has your wound(s) ever healed and then re-openedo No Have you had any lab work done in the past montho No Have you tested positive for an antibiotic resistant organism (MRSA, VRE)o No Have you tested positive for osteomyelitis (bone infection)o No Have you had any tests for circulation on your legso No Constitutional Symptoms (General Health) Complaints and Symptoms: Negative for: Fever; Chills Eyes Medical History: Positive for: Cataracts - Removed Negative for: Glaucoma; Optic Neuritis Ear/Nose/Mouth/Throat Medical History: Negative for: Chronic sinus problems/congestion; Middle ear problems Hematologic/Lymphatic Medical History: Negative for: Anemia; Hemophilia; Human Immunodeficiency Virus; Lymphedema; Sickle Cell Disease Respiratory Complaints  and Symptoms: No Complaints or Symptoms Medical History: Positive for: Chronic Obstructive Pulmonary Disease (COPD); Sleep Apnea Negative for: Aspiration; Asthma; Pneumothorax; Tuberculosis Cardiovascular Medical History: Positive for: Hypertension Negative for: Angina; Arrhythmia; Congestive Heart Failure; Coronary Artery Disease; Deep Vein Thrombosis; Hypotension; Myocardial Infarction; Peripheral Arterial Disease; Peripheral Venous Disease; Phlebitis; Vasculitis SHADASIA, OLDFIELD ([id number]) Gastrointestinal Medical History: Negative for: Cirrhosis ; Colitis; Crohnos; Hepatitis A; Hepatitis B; Hepatitis C Endocrine Medical History: Positive for: Type II Diabetes Negative for: Type I Diabetes Time with diabetes: 20 plus years Treated with: Insulin Blood sugar tested every day: Yes Tested : 3 Blood sugar testing results: Breakfast: 92; Bedtime: 120 Genitourinary Medical History: Negative for: End Stage Renal Disease Immunological Medical  History: Negative for: Lupus Erythematosus; Raynaudos; Scleroderma Integumentary (Skin) Medical History: Negative for: History of Burn; History of pressure wounds Musculoskeletal Medical History: Positive for: Gout; Osteoarthritis Negative for: Rheumatoid Arthritis; Osteomyelitis Neurologic Medical History: Positive for: Neuropathy - Hands and feet Negative for: Dementia; Quadriplegia; Paraplegia; Seizure Disorder Oncologic Medical History: Positive for: Received Chemotherapy Negative for: Received Radiation Past Medical History Notes: Breast Cancer 1999 HBO Extended History Items Eyes: Cataracts Immunizations Pneumococcal Vaccine: Received Pneumococcal Vaccination: Yes MEKENNA, FINAU ([id number]) Implantable Devices Family and Social History Cancer: No; Diabetes: Yes - Paternal Grandparents; Heart Disease: Yes - Mother,Father,Maternal Grandparents,Paternal Grandparents; Hypertension: Yes - Mother; Kidney Disease: No; Lung Disease: No; Seizures: No; Stroke: No; Thyroid Problems: No; Tuberculosis: No; Former smoker - Quit 1994; Marital Status - Married; Alcohol Use: Never; Drug Use: No History; Caffeine Use: Never; Advanced Directives: Yes (Not Provided); Patient does not want information on Advanced Directives; Living Will: Yes (Not Provided); Medical Power of Attorney: Yes (Not Provided) Physician Affirmation I have reviewed and agree with the above information. Electronic Signature(s) Signed: 05/26/2017 4:14:11 PM By: Roger Shelter Signed: 05/26/2017 5:27:57 PM By: Worthy Keeler PA-C Entered By: Worthy Keeler on 05/26/2017 11:53:23 Teresa Krueger ([id number]) -------------------------------------------------------------------------------- SuperBill Details Patient Name: Teresa Krueger Date of Service: 05/26/2017 Medical Record Number: [id number] Patient Account Number: [id number] Date of Birth/Sex: 09/26/1933 (83 y.o. Female) Treating RN: Roger Shelter Primary Care Provider: Ramonita Lab Other Clinician: Referring Provider: Ramonita Lab Treating Provider/Extender: Melburn Hake, Charese Abundis Weeks in Treatment: 6 Diagnosis Coding ICD-10 Codes Code Description E11.622 Type 2 diabetes mellitus with other skin ulcer L97.222 Non-pressure chronic ulcer of left calf with fat layer exposed S81.812A Laceration without foreign body, left lower leg, initial encounter M70.962 Unspecified soft tissue disorder related to use, overuse and pressure, left lower leg E66.01 Morbid (severe) obesity due to excess calories Z99.3 Dependence on wheelchair Facility Procedures CPT4 Code: [id number] Description: [phone] - WOUND CARE VISIT-LEV 2 EST PT Modifier: Quantity: 1 Physician Procedures CPT4 Code Description: [id number] - WC PHYS LEVEL 2 - EST PT ICD-10 Diagnosis Description E11.622 Type 2 diabetes mellitus with other skin ulcer L97.222 Non-pressure chronic ulcer of left calf with fat layer exposed S81.812A Laceration without foreign  body, left lower leg, initial encou M70.962 Unspecified soft tissue disorder related to use, overuse and p Modifier: nter ressure, left low Quantity: 1 er leg Electronic Signature(s) Signed: 05/26/2017 4:00:35 PM By: Roger Shelter Signed: 05/26/2017 5:27:57 PM By: Worthy Keeler PA-C Entered By: Roger Shelter on 05/26/2017 16:00:35

## 2017-06-02 ENCOUNTER — Ambulatory Visit: Payer: Medicare Other | Admitting: Surgery

## 2017-06-09 ENCOUNTER — Ambulatory Visit: Payer: Medicare Other | Admitting: Surgery

## 2017-06-10 ENCOUNTER — Other Ambulatory Visit: Payer: Self-pay | Admitting: Internal Medicine

## 2017-06-10 DIAGNOSIS — N6310 Unspecified lump in the right breast, unspecified quadrant: Secondary | ICD-10-CM

## 2017-06-22 IMAGING — MG MM DIGITAL SCREENING UNILAT*R* W/ TOMO W/ CAD
6 series · 6 of 14 positions shown · non-contrast
Comparison: Previous exam(s).

CLINICAL DATA: Screening.

EXAM:
2D DIGITAL SCREENING UNILATERAL RIGHT MAMMOGRAM WITH CAD AND ADJUNCT
TOMO

[R CC]
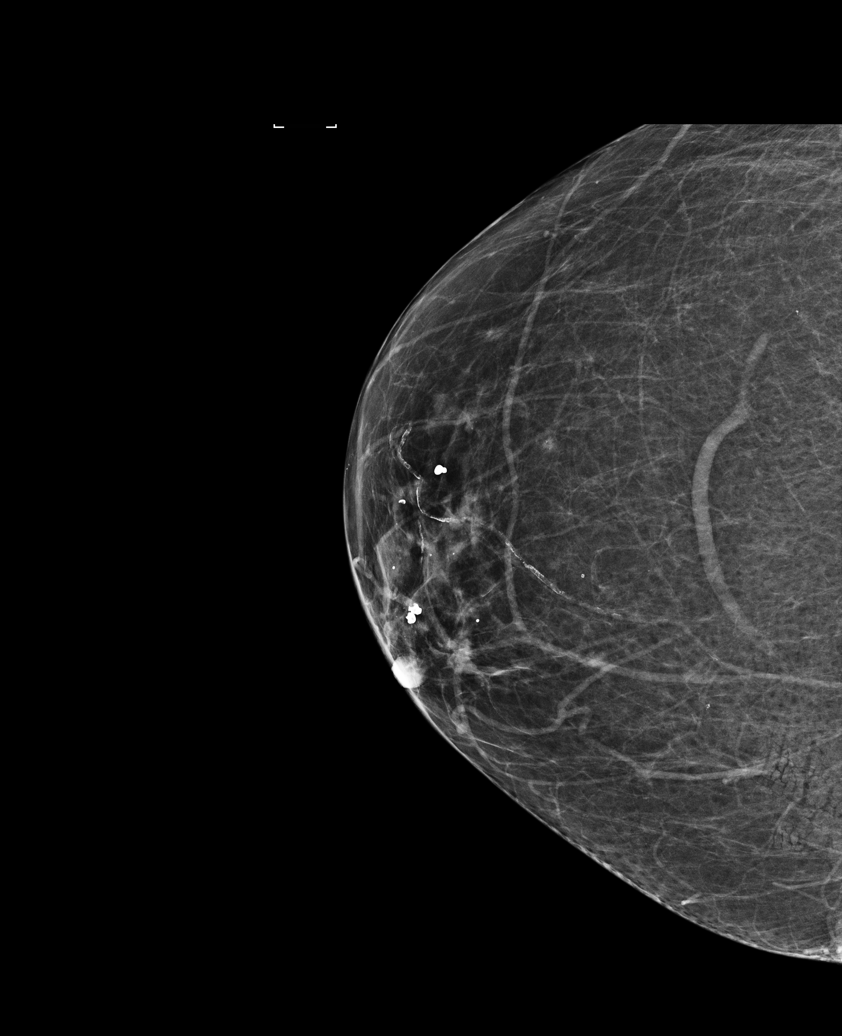

[R MLO synth-2D]
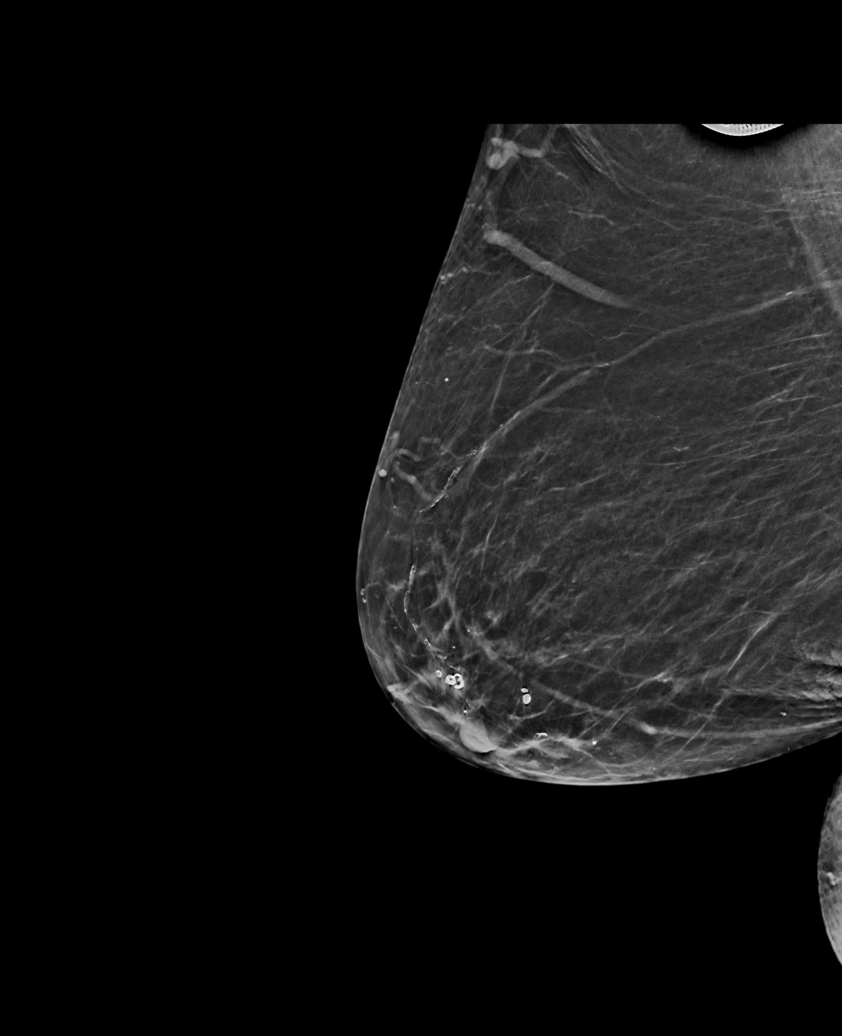

[R MLO]
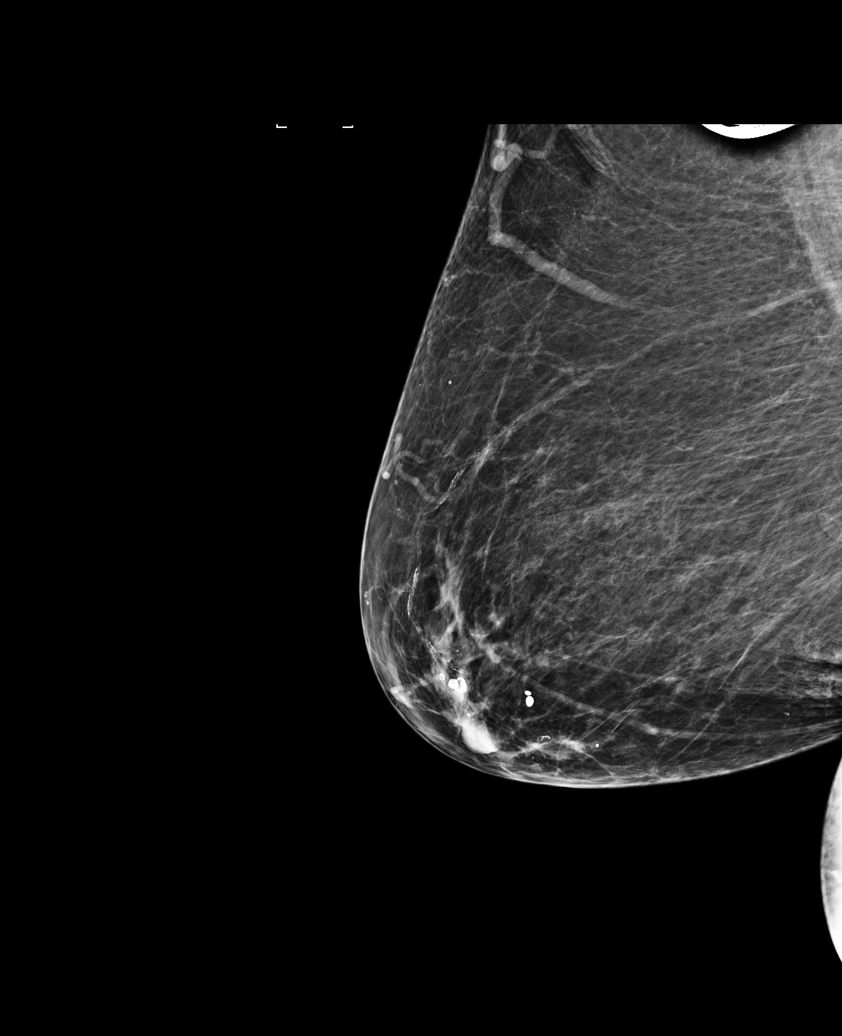

[R CC synth-2D]
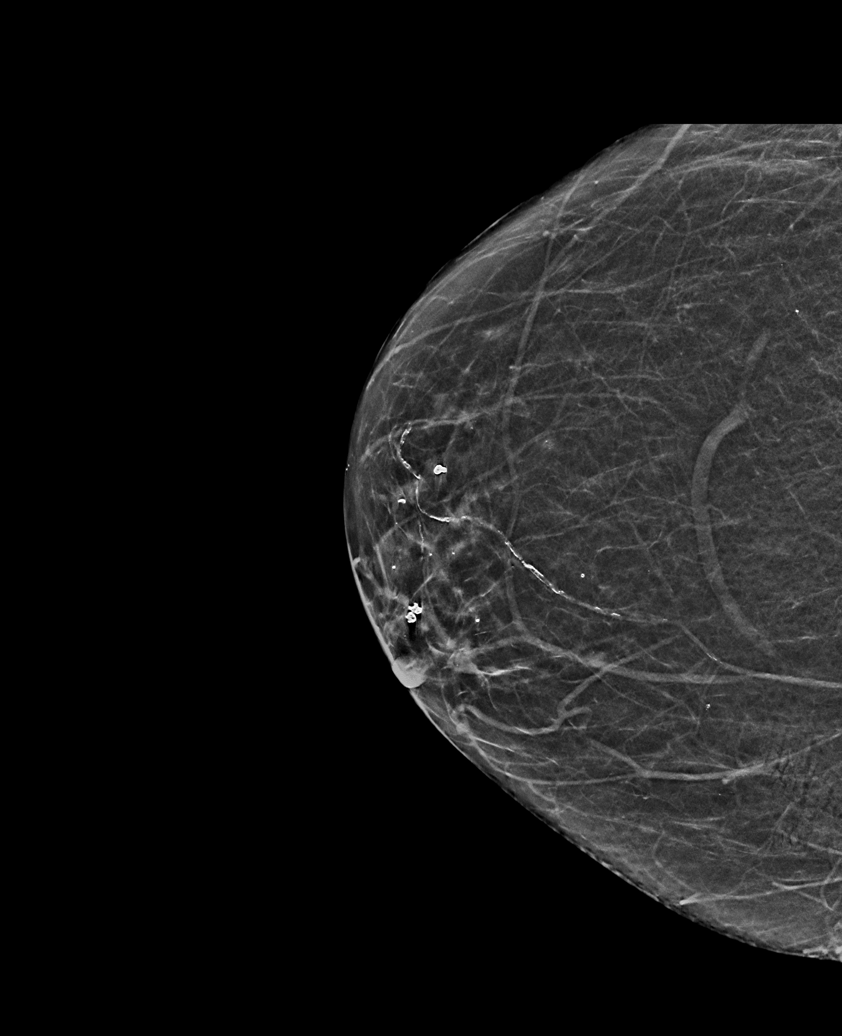

[R CC tomo · tomo slice 23/46.0]
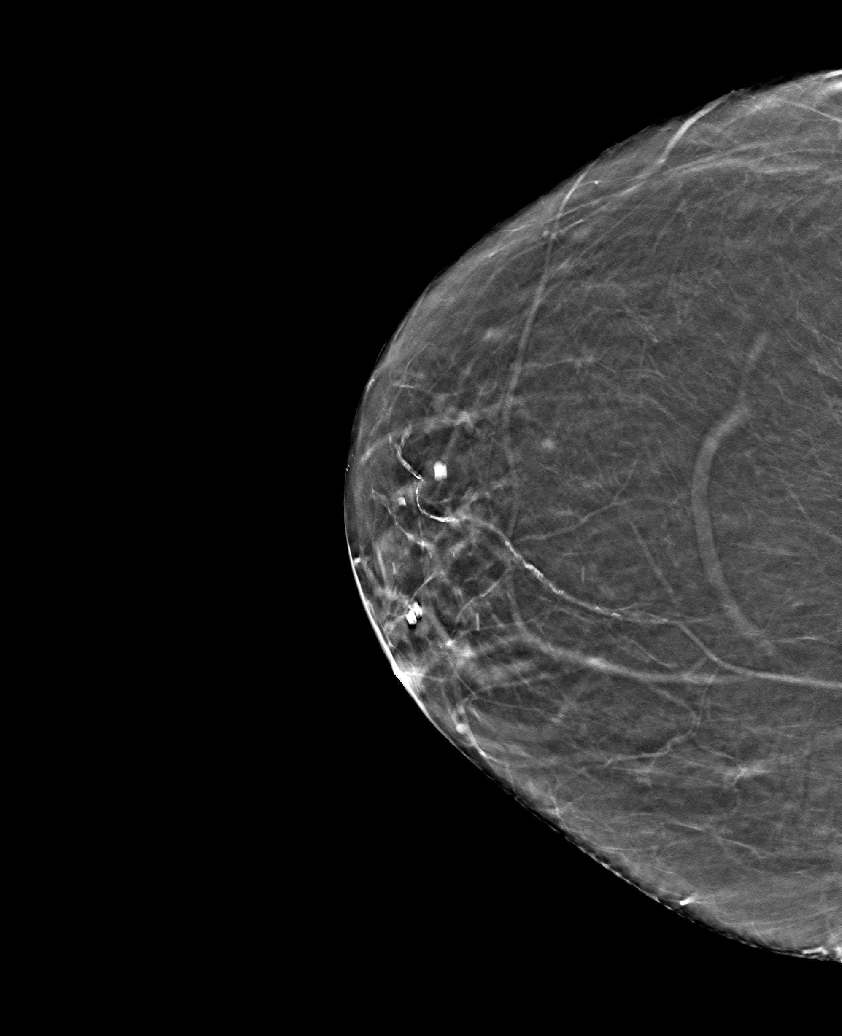

[R MLO tomo · tomo slice 31/61.0]
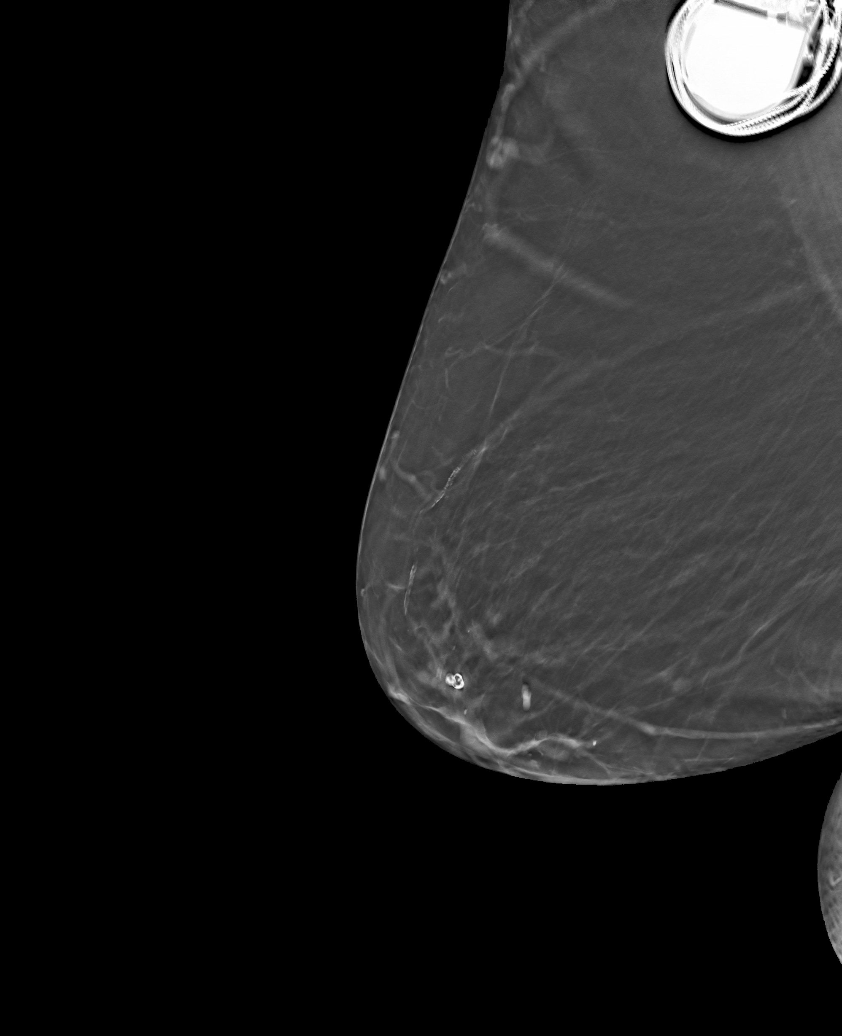

[6 of 14 positions shown; findings below may reference images not displayed]

ACR Breast Density Category b: There are scattered areas of
fibroglandular density.
FINDINGS: The patient has had a left mastectomy. There are no findings
suspicious for malignancy. Images were processed with CAD.
IMPRESSION: No mammographic evidence of malignancy. A result letter of this
screening mammogram will be mailed directly to the patient.

RECOMMENDATION:
Screening mammogram in one year.  (Code:[id number])

BI-RADS CATEGORY  1: Negative.

## 2017-06-30 ENCOUNTER — Other Ambulatory Visit: Payer: Self-pay | Admitting: Internal Medicine

## 2017-06-30 DIAGNOSIS — N6310 Unspecified lump in the right breast, unspecified quadrant: Secondary | ICD-10-CM

## 2017-07-15 ENCOUNTER — Other Ambulatory Visit: Payer: Self-pay | Admitting: Internal Medicine

## 2017-07-15 ENCOUNTER — Ambulatory Visit (INDEPENDENT_AMBULATORY_CARE_PROVIDER_SITE_OTHER): Payer: Medicare Other | Admitting: Podiatry

## 2017-07-15 ENCOUNTER — Ambulatory Visit
Admission: RE | Admit: 2017-07-15 | Discharge: 2017-07-15 | Disposition: A | Discharge status: Home / Self Care | Payer: Medicare Other | Source: Ambulatory Visit | Attending: Internal Medicine | Admitting: Internal Medicine

## 2017-07-15 ENCOUNTER — Encounter: Payer: Self-pay | Admitting: Podiatry

## 2017-07-15 DIAGNOSIS — N6312 Unspecified lump in the right breast, upper inner quadrant: Secondary | ICD-10-CM | POA: Diagnosis present

## 2017-07-15 DIAGNOSIS — M79676 Pain in unspecified toe(s): Secondary | ICD-10-CM

## 2017-07-15 DIAGNOSIS — N6310 Unspecified lump in the right breast, unspecified quadrant: Secondary | ICD-10-CM

## 2017-07-15 DIAGNOSIS — B351 Tinea unguium: Secondary | ICD-10-CM | POA: Diagnosis not present

## 2017-07-15 DIAGNOSIS — E0842 Diabetes mellitus due to underlying condition with diabetic polyneuropathy: Secondary | ICD-10-CM

## 2017-07-19 NOTE — Progress Notes (Signed)
   SUBJECTIVE Patient with a history of diabetes mellitus presents to office today complaining of elongated, thickened nails. Pain while ambulating in shoes. Patient is unable to trim their own nails.   Past Medical History:  Diagnosis Date  . Arthritis   . Asthma   . Barrett's esophagus   . Breast cancer ([place]) 1999   LT MASTECTOMY  . Bronchiectasis ([place])   . COPD (chronic obstructive pulmonary disease) ([place])   . Diabetes mellitus ([place])   . Hypercholesterolemia   . Hypertension   . Lymphedema   . Mini stroke ([place])   . Osteoporosis   . Pacemaker     OBJECTIVE General Patient is awake, alert, and oriented x 3 and in no acute distress. Derm Skin is dry and supple bilateral. Negative open lesions or macerations. Remaining integument unremarkable. Nails are tender, long, thickened and dystrophic with subungual debris, consistent with onychomycosis, 1-5 bilateral. No signs of infection noted. Vasc  DP and PT pedal pulses palpable bilaterally. Temperature gradient within normal limits.  Neuro Epicritic and protective threshold sensation diminished bilaterally.  Musculoskeletal Exam No symptomatic pedal deformities noted bilateral. Muscular strength within normal limits.  ASSESSMENT 1. Diabetes Mellitus w/ peripheral neuropathy 2. Onychomycosis of nail due to dermatophyte bilateral 3. Pain in foot bilateral  PLAN OF CARE 1. Patient evaluated today. 2. Instructed to maintain good pedal hygiene and foot care. Stressed importance of controlling blood sugar.  3. Mechanical debridement of nails 1-5 bilaterally performed using a nail nipper. Filed with dremel without incident.  4. Return to clinic in 3 mos.     Edrick Kins, DPM [facility]  Dr. Edrick Kins, [address]                                        [place], [place] [address]                Office [phone]  Fax [phone]

## 2017-08-05 ENCOUNTER — Telehealth: Payer: Self-pay | Admitting: Internal Medicine

## 2017-08-05 NOTE — Telephone Encounter (Signed)
Form filled out and placed in DK's folder to be signed.

## 2017-08-05 NOTE — Telephone Encounter (Signed)
LMOVM for pt to return call due to she has on [employer] forms that she has a battery powered wheelchair. LM that if she isn't wearing her oxygen then her form needs to go to her PCP. Will await call back from patient.

## 2017-08-05 NOTE — Telephone Encounter (Signed)
Patient came by and dropped off forms to be completed Placed in Nurse Box

## 2017-08-06 NOTE — Telephone Encounter (Signed)
Patient returning call, would like to discuss issue about battery powered wheelchair  She would like for forms to be filled out Please call

## 2017-08-06 NOTE — Telephone Encounter (Signed)
Forms faxed to [employer]. Pt informed and will pick up copies. Nothing further needed.

## 2017-10-14 ENCOUNTER — Encounter: Payer: Self-pay | Admitting: Podiatry

## 2017-10-14 ENCOUNTER — Ambulatory Visit (INDEPENDENT_AMBULATORY_CARE_PROVIDER_SITE_OTHER): Payer: Medicare Other | Admitting: Podiatry

## 2017-10-14 DIAGNOSIS — E0842 Diabetes mellitus due to underlying condition with diabetic polyneuropathy: Secondary | ICD-10-CM

## 2017-10-14 DIAGNOSIS — B351 Tinea unguium: Secondary | ICD-10-CM

## 2017-10-14 DIAGNOSIS — M79676 Pain in unspecified toe(s): Secondary | ICD-10-CM

## 2017-10-16 NOTE — Progress Notes (Signed)
   SUBJECTIVE Patient with a history of diabetes mellitus presents to office today complaining of elongated, thickened nails that cause pain while ambulating in shoes. She is unable to trim her own nails. Patient is here for further evaluation and treatment.   Past Medical History:  Diagnosis Date  . Arthritis   . Asthma   . Barrett's esophagus   . Breast cancer ([place]) 1999   LT MASTECTOMY  . Bronchiectasis ([place])   . COPD (chronic obstructive pulmonary disease) ([place])   . Diabetes mellitus ([place])   . Hypercholesterolemia   . Hypertension   . Lymphedema   . Mini stroke ([place])   . Osteoporosis   . Pacemaker     OBJECTIVE General Patient is awake, alert, and oriented x 3 and in no acute distress. Derm Skin is dry and supple bilateral. Negative open lesions or macerations. Remaining integument unremarkable. Nails are tender, long, thickened and dystrophic with subungual debris, consistent with onychomycosis, 1-5 bilateral. No signs of infection noted. Vasc  DP and PT pedal pulses palpable bilaterally. Temperature gradient within normal limits.  Neuro Epicritic and protective threshold sensation diminished bilaterally.  Musculoskeletal Exam No symptomatic pedal deformities noted bilateral. Muscular strength within normal limits.  ASSESSMENT 1. Diabetes Mellitus w/ peripheral neuropathy 2. Onychomycosis of nail due to dermatophyte bilateral 3. Pain in foot bilateral  PLAN OF CARE 1. Patient evaluated today. 2. Instructed to maintain good pedal hygiene and foot care. Stressed importance of controlling blood sugar.  3. Mechanical debridement of nails 1-5 bilaterally performed using a nail nipper. Filed with dremel without incident.  4. Return to clinic in 3 mos.     Edrick Kins, DPM [facility]  Dr. Edrick Kins, [place]                                        [place], [place] [address]                Office [phone]  Fax [phone]

## 2017-10-28 ENCOUNTER — Encounter: Payer: Self-pay | Admitting: Internal Medicine

## 2017-10-28 ENCOUNTER — Ambulatory Visit (INDEPENDENT_AMBULATORY_CARE_PROVIDER_SITE_OTHER): Payer: Medicare Other | Admitting: Internal Medicine

## 2017-10-28 VITALS — BP 122/72 | HR 80 | Ht 60.0 in | Wt 266.0 lb

## 2017-10-28 DIAGNOSIS — J479 Bronchiectasis, uncomplicated: Secondary | ICD-10-CM | POA: Diagnosis not present

## 2017-10-28 MED ORDER — AMBULATORY NON FORMULARY MEDICATION: 0 refills | Status: DC

## 2017-10-28 NOTE — Patient Instructions (Addendum)
Continue Flutter valve prescribed Continue oxygen as prescribed Incentive spirometry   RECOMMEND GI REFERRAL AND CARDIOLOGY REFERRAL

## 2017-10-28 NOTE — Progress Notes (Signed)
Subjective:    Patient ID: Teresa Krueger, female    DOB: 13-Aug-1933, 84 y.o.   MRN: [id number]  Synopsis: Teresa Krueger first saw the [name] pulmonary clinic in July 2013 for cough. She has a one pack per day smoking history x30 years and quit in the 1990s. She had simple spirometry in July 2013 which showed no evidence of COPD. She has a history of childhood pertussis  She has postnasal drip and gastroesophageal reflux disease with Barrett's esophagus.   CC: follow up for Bronchiectasis  HPI  No complaints, doing really well, chronic cough and DOE,SOB She had a CT angiogram of her chest feb 2016 recently which was negative for PE but showed some small 12mm nodules.   I also saw bronchiectasis.- repeat CT 07/2015 shows stable nodules- Repeat CT chest 01/2016-shows stable nodules  Uses flutter valve once daily, uses albuterol infrequently No signs of infection at this time SOB/DOE same as before no change She is wheelchair bound S/p pacemaker placement   Review of Systems  Constitutional: Negative for chills, diaphoresis, fatigue and fever.  HENT: Negative for congestion, postnasal drip and rhinorrhea.   Respiratory: Negative for cough, chest tightness, shortness of breath, wheezing and stridor.   Cardiovascular: Positive for leg swelling. Negative for chest pain.  All other systems reviewed and are negative.     Objective:   Physical Exam  Constitutional: She appears well-developed and well-nourished. No distress.  HENT:  Head: Normocephalic and atraumatic.  Eyes: Pupils are equal, round, and reactive to light.  Neck: Normal range of motion. Neck supple.  Cardiovascular: Normal rate and regular rhythm.  No murmur heard. Pulmonary/Chest: Effort normal and breath sounds normal. No respiratory distress. She has no wheezes. She has no rales.  Musculoskeletal: She exhibits edema.  Skin: She is not diaphoretic.   BP 122/72 (BP Location: Right Arm, Cuff Size:  Normal)   Pulse 80   Ht 5' (1.524 m)   Wt 266 lb (120.7 kg)   SpO2 97%   BMI 51.95 kg/m    04/2012 CT chest >> no emphysema, there is some bronchiectasis in the RML and Lingula ([place] read) 04/2012 CT sinuses >> no significant signs of sinusitis 04/2012 CT chest [facility] >> no emphysema or nodules, mild RML and lingula bronchiectasis  04/2012 AFB culture x3 negative February 2016 CT angiogram chest> mild lingular bronchiectasis, scattered pulmonary nodules bilaterally, largest is 6 mm, no pulmonary embolism Feb 2017 CTchest stabel nodules, recommend repeat in 6 months. 01/2016 Ct chest 1. Stable noncalcified subpleural nodule in the periphery of the right upper lobe.  2. Moderate thoracic aortic atherosclerosis. 3. Calcifications in the distribution of the left anterior descending and right coronary arteries.   Assessment & Plan:  84 yo obese white female with Bronchiectasis that seems to be stable at this time with chronic hypoxic resp failure  1.Bronchiectasis -mild bronchiectasis. She has not had an exacerbation recently.  -advised to use flutter valve more frequently during the day -continue oxygen as needed at night -start incentive spirometry 10-15 times per day  2.Multiple pulmonary nodules She was noted to have multiple pulmonary nodules on her  CT chest. None of these are larger than 6 mm 1 year ago. -She has a heavy smoking history but has been greater than 30 years and she quit smoking.  -repeat Ct chest shows stable nodules will repeat in 6 months  Follow up in 6 months  Patient/Family are satisfied with Plan of action and management. All  questions answered  Corrin Parker, M.D.  [facility]  Medical Director [place] Director [facility] Cardio-Pulmonary Department

## 2018-01-05 ENCOUNTER — Ambulatory Visit: Payer: Medicare Other | Admitting: Podiatry

## 2018-01-13 ENCOUNTER — Ambulatory Visit: Payer: Medicare Other | Admitting: Podiatry

## 2018-01-22 ENCOUNTER — Ambulatory Visit: Payer: Medicare Other | Admitting: Podiatry

## 2018-02-05 ENCOUNTER — Ambulatory Visit (INDEPENDENT_AMBULATORY_CARE_PROVIDER_SITE_OTHER): Payer: Medicare Other | Admitting: Podiatry

## 2018-02-05 ENCOUNTER — Encounter: Payer: Self-pay | Admitting: Podiatry

## 2018-02-05 DIAGNOSIS — B351 Tinea unguium: Secondary | ICD-10-CM

## 2018-02-05 DIAGNOSIS — M79676 Pain in unspecified toe(s): Secondary | ICD-10-CM

## 2018-02-05 DIAGNOSIS — E0842 Diabetes mellitus due to underlying condition with diabetic polyneuropathy: Secondary | ICD-10-CM

## 2018-02-05 NOTE — Progress Notes (Signed)
Complaint:  Visit Type: Patient returns to my office for continued preventative foot care services. Complaint: Patient states" my nails have grown long and thick and become painful to walk and wear shoes" Patient has been diagnosed with DM with no foot complications. The patient presents for preventative foot care services. No changes to ROS  Podiatric Exam: Vascular: dorsalis pedis and posterior tibial pulses are palpable bilateral. Capillary return is immediate. Temperature gradient is WNL. Skin turgor WNL  Sensorium: Diminished Semmes Weinstein monofilament test. Normal tactile sensation bilaterally. Nail Exam: Pt has thick disfigured discolored nails with subungual debris noted bilateral entire nail hallux through fifth toenails Ulcer Exam: There is no evidence of ulcer or pre-ulcerative changes or infection. Orthopedic Exam: Muscle tone and strength are WNL. No limitations in general ROM. No crepitus or effusions noted. Foot type and digits show no abnormalities. Bony prominences are unremarkable. Skin: No Porokeratosis. No infection or ulcers.  Asymptomatic heel callus.  Diagnosis:  Onychomycosis, , Pain in right toe, pain in left toes  Treatment & Plan Procedures and Treatment: Consent by patient was obtained for treatment procedures.   Debridement of mycotic and hypertrophic toenails, 1 through 5 bilateral and clearing of subungual debris. No ulceration, no infection noted.  Return Visit-Office Procedure: Patient instructed to return to the office for a follow up visit 3 months for continued evaluation and treatment.    Gardiner Barefoot DPM

## 2018-02-09 ENCOUNTER — Telehealth: Payer: Self-pay | Admitting: Podiatry

## 2018-02-09 NOTE — Telephone Encounter (Signed)
error 

## 2018-03-09 ENCOUNTER — Emergency Department: Payer: Medicare Other

## 2018-03-09 ENCOUNTER — Other Ambulatory Visit: Payer: Self-pay

## 2018-03-09 ENCOUNTER — Observation Stay: Payer: Medicare Other

## 2018-03-09 ENCOUNTER — Observation Stay
Admission: EM | Admit: 2018-03-09 | Discharge: 2018-03-10 | Disposition: A | Discharge status: Home / Self Care | Payer: Medicare Other | Attending: Internal Medicine | Admitting: Internal Medicine

## 2018-03-09 DIAGNOSIS — Z794 Long term (current) use of insulin: Secondary | ICD-10-CM | POA: Diagnosis not present

## 2018-03-09 DIAGNOSIS — Z880 Allergy status to penicillin: Secondary | ICD-10-CM | POA: Insufficient documentation

## 2018-03-09 DIAGNOSIS — Z79899 Other long term (current) drug therapy: Secondary | ICD-10-CM | POA: Diagnosis not present

## 2018-03-09 DIAGNOSIS — Z9012 Acquired absence of left breast and nipple: Secondary | ICD-10-CM | POA: Insufficient documentation

## 2018-03-09 DIAGNOSIS — Z7902 Long term (current) use of antithrombotics/antiplatelets: Secondary | ICD-10-CM | POA: Diagnosis not present

## 2018-03-09 DIAGNOSIS — Z888 Allergy status to other drugs, medicaments and biological substances status: Secondary | ICD-10-CM | POA: Diagnosis not present

## 2018-03-09 DIAGNOSIS — Z886 Allergy status to analgesic agent status: Secondary | ICD-10-CM | POA: Diagnosis not present

## 2018-03-09 DIAGNOSIS — I129 Hypertensive chronic kidney disease with stage 1 through stage 4 chronic kidney disease, or unspecified chronic kidney disease: Secondary | ICD-10-CM | POA: Insufficient documentation

## 2018-03-09 DIAGNOSIS — R55 Syncope and collapse: Secondary | ICD-10-CM | POA: Diagnosis not present

## 2018-03-09 DIAGNOSIS — E7800 Pure hypercholesterolemia, unspecified: Secondary | ICD-10-CM | POA: Insufficient documentation

## 2018-03-09 DIAGNOSIS — Z881 Allergy status to other antibiotic agents status: Secondary | ICD-10-CM | POA: Insufficient documentation

## 2018-03-09 DIAGNOSIS — M1990 Unspecified osteoarthritis, unspecified site: Secondary | ICD-10-CM | POA: Insufficient documentation

## 2018-03-09 DIAGNOSIS — R6883 Chills (without fever): Secondary | ICD-10-CM | POA: Insufficient documentation

## 2018-03-09 DIAGNOSIS — Z853 Personal history of malignant neoplasm of breast: Secondary | ICD-10-CM | POA: Insufficient documentation

## 2018-03-09 DIAGNOSIS — N183 Chronic kidney disease, stage 3 (moderate): Secondary | ICD-10-CM | POA: Diagnosis not present

## 2018-03-09 DIAGNOSIS — J449 Chronic obstructive pulmonary disease, unspecified: Secondary | ICD-10-CM | POA: Diagnosis not present

## 2018-03-09 DIAGNOSIS — E860 Dehydration: Secondary | ICD-10-CM | POA: Diagnosis not present

## 2018-03-09 DIAGNOSIS — Z882 Allergy status to sulfonamides status: Secondary | ICD-10-CM | POA: Insufficient documentation

## 2018-03-09 DIAGNOSIS — Z8673 Personal history of transient ischemic attack (TIA), and cerebral infarction without residual deficits: Secondary | ICD-10-CM | POA: Diagnosis not present

## 2018-03-09 DIAGNOSIS — Z96642 Presence of left artificial hip joint: Secondary | ICD-10-CM | POA: Diagnosis not present

## 2018-03-09 DIAGNOSIS — Z66 Do not resuscitate: Secondary | ICD-10-CM | POA: Insufficient documentation

## 2018-03-09 DIAGNOSIS — Z950 Presence of cardiac pacemaker: Secondary | ICD-10-CM | POA: Diagnosis not present

## 2018-03-09 DIAGNOSIS — E1122 Type 2 diabetes mellitus with diabetic chronic kidney disease: Secondary | ICD-10-CM | POA: Insufficient documentation

## 2018-03-09 DIAGNOSIS — Z87891 Personal history of nicotine dependence: Secondary | ICD-10-CM | POA: Insufficient documentation

## 2018-03-09 DIAGNOSIS — Z885 Allergy status to narcotic agent status: Secondary | ICD-10-CM | POA: Insufficient documentation

## 2018-03-09 LAB — COMPREHENSIVE METABOLIC PANEL
ALBUMIN: 4.3 g/dL (ref 3.5–5.0)
ALT: 15 U/L (ref 0–44)
ANION GAP: 8 (ref 5–15)
AST: 19 U/L (ref 15–41)
Alkaline Phosphatase: 75 U/L (ref 38–126)
BILIRUBIN TOTAL: 0.8 mg/dL (ref 0.3–1.2)
BUN: 32 mg/dL — ABNORMAL HIGH (ref 8–23)
CALCIUM: 9.3 mg/dL (ref 8.9–10.3)
CHLORIDE: 103 mmol/L (ref 98–111)
CO2: 27 mmol/L (ref 22–32)
CREATININE: 1.01 mg/dL — AB (ref 0.44–1.00)
GFR calc Af Amer: 58 mL/min — ABNORMAL LOW (ref >60)
GFR, EST NON AFRICAN AMERICAN: 50 mL/min — AB (ref >60)
Glucose, Bld: 171 mg/dL — ABNORMAL HIGH (ref 70–99)
POTASSIUM: 4.4 mmol/L (ref 3.5–5.1)
Sodium: 138 mmol/L (ref 135–145)
TOTAL PROTEIN: 7 g/dL (ref 6.5–8.1)

## 2018-03-09 LAB — GLUCOSE, CAPILLARY
Glucose-Capillary: 168 mg/dL — ABNORMAL HIGH (ref 70–99)
Glucose-Capillary: 177 mg/dL — ABNORMAL HIGH (ref 70–99)

## 2018-03-09 LAB — TROPONIN I: Troponin I: <0.03 ng/mL (ref <0.03)

## 2018-03-09 LAB — URINALYSIS, COMPLETE (UACMP) WITH MICROSCOPIC
BACTERIA UA: NONE SEEN
BILIRUBIN URINE: NEGATIVE
GLUCOSE, UA: NEGATIVE mg/dL
Hgb urine dipstick: NEGATIVE
Ketones, ur: NEGATIVE mg/dL
Leukocytes, UA: NEGATIVE
Nitrite: NEGATIVE
PH: 6 (ref 5.0–8.0)
PROTEIN: NEGATIVE mg/dL
Specific Gravity, Urine: 1.013 (ref 1.005–1.030)

## 2018-03-09 LAB — MAGNESIUM: Magnesium: 2.2 mg/dL (ref 1.7–2.4)

## 2018-03-09 LAB — CBC
HCT: 38.7 % (ref 35.0–47.0)
HEMOGLOBIN: 13.4 g/dL (ref 12.0–16.0)
MCH: 30.4 pg (ref 26.0–34.0)
MCHC: 34.5 g/dL (ref 32.0–36.0)
MCV: 88.1 fL (ref 80.0–100.0)
PLATELETS: 169 10*3/uL (ref 150–440)
RBC: 4.39 MIL/uL (ref 3.80–5.20)
RDW: 13.5 % (ref 11.5–14.5)
WBC: 6.2 10*3/uL (ref 3.6–11.0)

## 2018-03-09 LAB — LACTIC ACID, PLASMA: LACTIC ACID, VENOUS: 1 mmol/L (ref 0.5–1.9)

## 2018-03-09 MED ORDER — SENNOSIDES-DOCUSATE SODIUM 8.6-50 MG PO TABS: 1.0000 | ORAL_TABLET | Freq: Every evening | ORAL | Status: DC | PRN

## 2018-03-09 MED ORDER — INSULIN ASPART 100 UNIT/ML ~~LOC~~ SOLN: 0.0000 [IU] | Freq: Every day | SUBCUTANEOUS | Status: DC

## 2018-03-09 MED ORDER — BISACODYL 5 MG PO TBEC: 5.0000 mg | DELAYED_RELEASE_TABLET | Freq: Every day | ORAL | Status: DC | PRN

## 2018-03-09 MED ORDER — LOSARTAN POTASSIUM 25 MG PO TABS
25.0000 mg | ORAL_TABLET | Freq: Every day | ORAL | Status: DC
  Administered 2018-03-10: 25 mg via ORAL
  Filled 2018-03-09: qty 1

## 2018-03-09 MED ORDER — SALINE SPRAY 0.65 % NA SOLN
1.0000 | NASAL | Status: DC | PRN
  Administered 2018-03-10 (×2): 1 via NASAL
  Filled 2018-03-09: qty 44

## 2018-03-09 MED ORDER — INSULIN ASPART PROT & ASPART (70-30 MIX) 100 UNIT/ML ~~LOC~~ SUSP
10.0000 [IU] | Freq: Two times a day (BID) | SUBCUTANEOUS | Status: DC
  Administered 2018-03-09 – 2018-03-10 (×2): 10 [IU] via SUBCUTANEOUS
  Filled 2018-03-09 (×2): qty 10

## 2018-03-09 MED ORDER — ONDANSETRON HCL 4 MG/2ML IJ SOLN: 4.0000 mg | Freq: Four times a day (QID) | INTRAMUSCULAR | Status: DC | PRN

## 2018-03-09 MED ORDER — MONTELUKAST SODIUM 10 MG PO TABS
10.0000 mg | ORAL_TABLET | Freq: Every day | ORAL | Status: DC
  Administered 2018-03-10: 5 mg via ORAL
  Filled 2018-03-09: qty 1

## 2018-03-09 MED ORDER — INSULIN ASPART 100 UNIT/ML ~~LOC~~ SOLN
0.0000 [IU] | Freq: Three times a day (TID) | SUBCUTANEOUS | Status: DC
  Administered 2018-03-09 – 2018-03-10 (×2): 2 [IU] via SUBCUTANEOUS
  Filled 2018-03-09 (×2): qty 1

## 2018-03-09 MED ORDER — SODIUM CHLORIDE 0.9 % IV BOLUS
500.0000 mL | Freq: Once | INTRAVENOUS | Status: AC
  Administered 2018-03-09: 500 mL via INTRAVENOUS

## 2018-03-09 MED ORDER — ONDANSETRON HCL 4 MG PO TABS: 4.0000 mg | ORAL_TABLET | Freq: Four times a day (QID) | ORAL | Status: DC | PRN

## 2018-03-09 MED ORDER — SODIUM CHLORIDE 0.9 % IV SOLN
INTRAVENOUS | Status: DC
  Administered 2018-03-09: 18:00:00 via INTRAVENOUS

## 2018-03-09 MED ORDER — CLOPIDOGREL BISULFATE 75 MG PO TABS
75.0000 mg | ORAL_TABLET | Freq: Every day | ORAL | Status: DC
  Administered 2018-03-10: 75 mg via ORAL
  Filled 2018-03-09: qty 1

## 2018-03-09 MED ORDER — ALPRAZOLAM 0.5 MG PO TABS
0.5000 mg | ORAL_TABLET | Freq: Three times a day (TID) | ORAL | Status: DC | PRN
  Administered 2018-03-09 – 2018-03-10 (×2): 0.5 mg via ORAL
  Filled 2018-03-09 (×2): qty 1

## 2018-03-09 MED ORDER — SIMVASTATIN 20 MG PO TABS
20.0000 mg | ORAL_TABLET | Freq: Every day | ORAL | Status: DC
  Administered 2018-03-10: 20 mg via ORAL
  Filled 2018-03-09: qty 1

## 2018-03-09 MED ORDER — ALBUTEROL SULFATE (2.5 MG/3ML) 0.083% IN NEBU: 2.5000 mg | INHALATION_SOLUTION | RESPIRATORY_TRACT | Status: DC | PRN

## 2018-03-09 MED ORDER — ENOXAPARIN SODIUM 40 MG/0.4ML ~~LOC~~ SOLN
40.0000 mg | Freq: Two times a day (BID) | SUBCUTANEOUS | Status: DC
  Administered 2018-03-09 – 2018-03-10 (×2): 40 mg via SUBCUTANEOUS
  Filled 2018-03-09 (×2): qty 0.4

## 2018-03-09 MED ORDER — LABETALOL HCL 5 MG/ML IV SOLN: 10.0000 mg | Freq: Four times a day (QID) | INTRAVENOUS | Status: DC | PRN

## 2018-03-09 NOTE — ED Notes (Signed)
Pt given water ok per MD Marchia Bond

## 2018-03-09 NOTE — ED Notes (Signed)
Report given to [facility]. Informed them of needing to get patient at this time

## 2018-03-09 NOTE — ED Notes (Signed)
Attempted to call report, per RN charge Janett Billow they are getting multiple patients at this time and please give about 5 minutes. Will call back shortly

## 2018-03-09 NOTE — ED Triage Notes (Signed)
Pt comes via [facility] from home with c/o near syncopal episode. Pt states she was sitting down driniking her coffee and became dizzy and weak. Pt's husband assisted her to bedroom. Pt also states some diarrhea and nausea this am. VSS, BS-[id number]. Pt does wear 2L of O2 at home. Pt is A&OX4.

## 2018-03-09 NOTE — Progress Notes (Signed)
Anticoagulation monitoring(Lovenox):  84 yo female ordered Lovenox 40 mg Q24h  Filed Weights   03/09/18 1312  Weight: 265 lb (120.2 kg)   BMI  51.75  Lab Results  Component Value Date   CREATININE 1.01 (H) 03/09/2018   CREATININE 1.20 (H) 04/15/2017   CREATININE 1.10 (H) 02/09/2017   Estimated Creatinine Clearance: 51.1 mL/min (A) (by C-G formula based on SCr of 1.01 mg/dL (H)). Hemoglobin & Hematocrit     Component Value Date/Time   HGB 13.4 03/09/2018 1423   HGB 13.9 03/18/2012 1341   HCT 38.7 03/09/2018 1423   HCT 43.1 03/18/2012 1341     Per Protocol for Patient with estCrcl > 30 ml/min and BMI > 40, will transition to Lovenox 40 mg Q12h.

## 2018-03-09 NOTE — ED Provider Notes (Signed)
[facility] Emergency Department Provider Note  ____________________________________________  Time seen: Approximately 1:19 PM  I have reviewed the triage vital signs and the nursing notes.   HISTORY  Chief Complaint Near Syncope    HPI Teresa Krueger is a 84 y.o. female w/ a hx of COPD on 2L [place], HTN, HL, DM presenting w/ presyncope.  The patient reports that she was sitting eating breakfast and drinking coffee when she had the acute onset of a lightheaded sensation like she might faint.  She went to the bathroom and had an episode of diarrhea, but this is not unusual for her.  Since arriving to the emergency department she has been having uncontrolled shaking of her entire body.  The patient does report new addition of Spiriva to her medications approximately 1 week ago.  She denies any recent cough or cold symptoms, fever or chills, dysuria, abdominal pain, chest pain, shortness of breath or palpitations  Past Medical History:  Diagnosis Date  . Arthritis   . Asthma   . Barrett's esophagus   . Breast cancer ([place]) 1999   LT MASTECTOMY  . Bronchiectasis ([place])   . COPD (chronic obstructive pulmonary disease) ([place])   . Diabetes mellitus ([place])   . Hypercholesterolemia   . Hypertension   . Lymphedema   . Mini stroke ([place])   . Osteoporosis   . Pacemaker     Patient Active Problem List   Diagnosis Date Noted  . CKD (chronic kidney disease) stage 3, GFR 30-59 ml/min (HCC) 02/14/2017  . Carotid artery stenosis 10/29/2016  . TIA (transient ischemic attack) 10/14/2016  . Swelling of limb 04/10/2016  . Varicose veins of leg with pain, bilateral 04/10/2016  . Lymphedema 04/10/2016  . Osteoarthritis 03/19/2016  . Osteoporosis 03/19/2016  . Coronary artery calcification seen on CAT scan 02/08/2016  . Incidental lung nodule 02/08/2016  . Thoracic aortic atherosclerosis ([place]) 02/08/2016  . S/P placement of cardiac pacemaker 03/30/2015  . Morbid obesity ([place])  03/17/2015  . Hypersomnia with sleep apnea 03/17/2015  . Mobitz type 2 second degree AV block 03/16/2015  . Multiple pulmonary nodules 09/13/2014  . Lung field abnormal 09/13/2014  . Barrett's esophagus 12/21/2013  . History of adenomatous polyp of colon 12/21/2013  . Hx of adenomatous colonic polyps 12/21/2013  . Benign essential hypertension 11/30/2013  . Acromioclavicular joint arthritis 11/23/2013  . Glenohumeral arthritis 11/23/2013  . Chest pain 10/21/2013  . Adult body mass index 50.0-59.9 ([place]) 02/09/2013  . Morbid obesity with BMI of 50.0-59.9, adult ([place]) 02/09/2013  . Primary localized osteoarthrosis, lower leg 02/09/2013  . Bronchiectasis ([place]) 05/19/2012  . Chronic bronchitis 12/31/2011  . Cough 12/31/2011  . GERD (gastroesophageal reflux disease) 12/31/2011  . Diabetes mellitus ([place])   . Hypertension     Past Surgical History:  Procedure Laterality Date  . CARPAL TUNNEL RELEASE     right  . CATARACT EXTRACTION     bilateral  . GALLBLADDER SURGERY    . MASTECTOMY Left 1999   BREAST CA  . PACEMAKER INSERTION Right 03/21/2015   Procedure: INSERTION PACEMAKER;  Surgeon: Isaias Cowman, MD;  Location: [facility] ORS;  Service: Cardiovascular;  Laterality: Right;  . TOTAL HIP ARTHROPLASTY  2001   left  . TRIGGER FINGER RELEASE     bilateral    Current Outpatient Rx  . Order #: [id number] Class: Print  . Order #: [id number] Class: Historical Med  . Order #: [id number] Class: Normal  . Order #: [id number] Class: Normal  . Order #:  [id number] Class: Historical Med  . Order #: [id number] Class: Historical Med  . Order #: [id number] Class: Historical Med  . Order #: [id number] Class: Historical Med  . Order #: [id number] Class: Historical Med  . Order #: [id number] Class: Historical Med  . Order #: [id number] Class: Print  . Order #: [id number] Class: Print  . Order #: [id number] Class: Print  . Order #: [id number] Class: Normal  . Order #: [id number] Class: Historical Med  . Order #:  [id number] Class: Print  . Order #: [id number] Class: Historical Med  . Order #: [id number] Class: Historical Med    Allergies Aspirin; Celebrex [celecoxib]; Meloxicam; Nsaids; Quetiapine; Rofecoxib; Sulfa antibiotics; Tolmetin; Bactrim [sulfamethoxazole-trimethoprim]; Biguanide copolymer; Buprenorphine hcl; Ciprofloxacin; Codeine; Dextromethorphan hbr; Dextromethorphan polistirex er; Furosemide; Hydrocodone; Metformin; Morphine and related; Penicillins; Pregabalin; Promethazine; Propoxyphene; Propoxyphene; Quinolones; Seroquel [quetiapine fumarate]; Serotonin reuptake inhibitors (ssris); Tramadol; and Ultracet [tramadol-acetaminophen]  Family History  Problem Relation Age of Onset  . Heart disease Paternal Grandmother   . Diabetes Paternal Grandmother   . Heart disease Father   . Heart disease Mother   . Heart disease Maternal Grandmother   . Stroke Maternal Aunt   . Breast cancer Paternal Aunt 94  . Lung cancer Brother 17  . Kidney disease Cousin   . Bladder Cancer Neg Hx     Social History Social History   Tobacco Use  . Smoking status: Former Smoker    Packs/day: 1.00    Years: 30.00    Pack years: 30.00    Types: Cigarettes    Last attempt to quit: 12/27/1992    Years since quitting: 25.2  . Smokeless tobacco: Never Used  Substance Use Topics  . Alcohol use: No  . Drug use: No    Review of Systems Constitutional: No fever.  Positive shaking chills.  Positive general malaise.  Positive presyncope without syncope. Eyes: No visual changes.  No blurred or double vision. ENT: No sore throat. No congestion or rhinorrhea. Cardiovascular: Denies chest pain. Denies palpitations. Respiratory: Denies shortness of breath.  No cough. Gastrointestinal: No abdominal pain.  No nausea, no vomiting.  Positive chronic unchanged diarrhea.  No constipation. Genitourinary: Negative for dysuria.  No urinary frequency. Musculoskeletal: Negative for back pain. Skin: Negative for  rash. Neurological: Negative for headaches. No focal numbness, tingling or weakness.  Positive full body shaking.  Patient uses a walker and has not noted any difference with this.    ____________________________________________   PHYSICAL EXAM:  VITAL SIGNS: ED Triage Vitals [03/09/18 1312]  Enc Vitals Group     BP      Pulse      Resp      Temp      Temp src      SpO2      Weight 265 lb (120.2 kg)     Height 5' (1.524 m)     Head Circumference      Peak Flow      Pain Score 0     Pain Loc      Pain Edu?      Excl. in [place]?     Constitutional: Alert and oriented. Answers questions appropriately. Eyes: Conjunctivae are normal.  EOMI. PERRLA. No scleral icterus. Head: Atraumatic. Nose: No congestion/rhinnorhea. Mouth/Throat: Mucous membranes are moist.  Neck: No stridor.  Supple.  No JVD.  No meningismus Cardiovascular: Normal rate, regular rhythm. No murmurs, rubs or gallops.  Respiratory: Normal respiratory effort.  No accessory muscle use or retractions. Lungs CTAB.  No wheezes, rales or ronchi. Gastrointestinal: Obese.  Soft, nontender and nondistended.  No guarding or rebound.  No peritoneal signs. Musculoskeletal: Positive symmetric bilateral LE edema. No ttp in the calves or palpable cords.  Negative Homan's sign. Neurologic:  A&Ox3.  Speech is clear.  Face and smile are symmetric.  EOMI.  Moves all extremities well.  Patient does have a resting tremor without tonic clonic activity, that comes and goes and is most consistent with rigors. Skin:  Skin is warm, dry and intact. No rash noted. Psychiatric: Mood and affect are normal. Speech and behavior are normal.  Normal judgement  ____________________________________________   LABS (all labs ordered are listed, but only abnormal results are displayed)  Labs Reviewed  URINALYSIS, COMPLETE (UACMP) WITH MICROSCOPIC - Abnormal; Notable for the following components:      Result Value   Color, Urine YELLOW (*)     APPearance CLEAR (*)    All other components within normal limits  CULTURE, BLOOD (ROUTINE X 2)  CULTURE, BLOOD (ROUTINE X 2)  CBC  TROPONIN I  COMPREHENSIVE METABOLIC PANEL  LACTIC ACID, PLASMA  LACTIC ACID, PLASMA   ____________________________________________  EKG  ED ECG REPORT I, Anne-Caroline Mariea Clonts, the attending physician, personally viewed and interpreted this ECG.   Date: 03/09/2018  EKG Time: 1327  Rate: 66  Rhythm: paced   Axis: paced  Intervals:paced  ST&T Change: paced  ____________________________________________  RADIOLOGY  Dg Chest 2 View  Result Date: 03/09/2018 CLINICAL DATA:  Presyncope. EXAM: CHEST - 2 VIEW COMPARISON:  02/14/2017 FINDINGS: Heart size is within normal limits considering the AP technique. Pulmonary vascularity is normal. Aortic atherosclerosis. Lungs are clear.  No effusions.  No acute bone abnormality. IMPRESSION: No acute abnormalities. Aortic Atherosclerosis (ICD10-I70.0). Electronically Signed   By: Lorriane Shire M.D.   On: 03/09/2018 14:00   Ct Head Wo Contrast  Result Date: 03/09/2018 CLINICAL DATA:  Presyncope EXAM: CT HEAD WITHOUT CONTRAST TECHNIQUE: Contiguous axial images were obtained from the base of the skull through the vertex without intravenous contrast. COMPARISON:  CTA head 04/15/2017 FINDINGS: Brain: There is no mass, hemorrhage or extra-axial collection. The size and configuration of the ventricles and extra-axial CSF spaces are normal. There is no acute or chronic infarction. The brain parenchyma is normal. Vascular: No abnormal hyperdensity of the major intracranial arteries or dural venous sinuses. No intracranial atherosclerosis. Skull: The visualized skull base, calvarium and extracranial soft tissues are normal. Sinuses/Orbits: Right frontal sinus osteoma.  The orbits are normal. IMPRESSION: Normal aging brain. Electronically Signed   By: Ulyses Jarred M.D.   On: 03/09/2018 13:54     ____________________________________________   PROCEDURES  Procedure(s) performed: None  Procedures  Critical Care performed: No ____________________________________________   INITIAL IMPRESSION / ASSESSMENT AND PLAN / ED COURSE  Pertinent labs & imaging results that were available during my care of the patient were reviewed by me and considered in my medical decision making (see chart for details).  84 y.o. female with multiple chronic comorbidities presenting with a presyncopal event while seated, now with shaking chills.  Overall, the patient is mentating well and hemodynamically stable.  However, there are multiple possible etiologies for her symptoms.  With the shaking chills, I am concerned about a UTI and will get blood cultures as well.  She has not been having any signs or symptoms that would be consistent with a respiratory cause for her symptoms.  I do not see any focal neurologic deficits but will get a CT of the head.  Her shaking episodes are not consistent with seizures.  Plan reevaluation for final disposition.  ----------------------------------------- 2:57 PM on 03/09/2018 -----------------------------------------  The patient's laboratory studies show a urinalysis without any abnormalities, and a normal white blood cell count.  Her CT head did not show any acute intracranial abnormality and her chest x-ray is also reassuring.  I do not know the cause of her symptoms today, but the patient will be admitted for continued evaluation and treatment.  ____________________________________________  FINAL CLINICAL IMPRESSION(S) / ED DIAGNOSES  Final diagnoses:  Pre-syncope  Shaking chills         NEW MEDICATIONS STARTED DURING THIS VISIT:  New Prescriptions   No medications on file      Eula Listen, MD 03/09/18 1458

## 2018-03-09 NOTE — ED Notes (Signed)
Patient transported to CT 

## 2018-03-09 NOTE — Progress Notes (Signed)
Advanced Care Plan.  Purpose of Encounter: CODE STATUS. Parties in Attendance: The patient, her husband and daughter-in-law. Patient's Decisional Capacity: Yes. Medical Story: Todd Argabright  is a 84 y.o. female with a known history of hypertension, hyperlipidemia, diabetes, COPD, mini stroke and status post pacemaker placement.    She is being admitted for near syncope, accelerated hypertension and dehydration.  I discussed with patient about patient current condition, prognosis and CODE STATUS.  The patient stated that she want to be resuscitated and intubated if she has quality of life but she does not want put on motion for long time.  Plan:  Code Status: Full code. Time spent discussing advance care planning: 17 minutes.

## 2018-03-09 NOTE — H&P (Addendum)
[facility] NAME: Teresa Krueger    MR#:  [id number]  DATE OF BIRTH:  04/06/1934  DATE OF ADMISSION:  03/09/2018  PRIMARY CARE PHYSICIAN: Adin Hector, MD   REQUESTING/REFERRING PHYSICIAN: Dr. Mariea Clonts  CHIEF COMPLAINT:   Chief Complaint  Patient presents with  . Near Syncope   Near syncope today HISTORY OF PRESENT ILLNESS:  Teresa Krueger  is a 84 y.o. female with a known history of hypertension, hyperlipidemia, diabetes, COPD, mini stroke and status post pacemaker placement.  The patient was sent to ED after near syncope episode at home.  She was sitting diagnosis eating breakfast and drinking coffee when she had a sudden onset of lightheadedness and almost pass out.  She also complains of one episode of diarrhea, nausea and chills but denies any fever, cough or shortness of breath.  Work-up so far in the ED patient is unremarkable so far.  Normal request admission.  PAST MEDICAL HISTORY:   Past Medical History:  Diagnosis Date  . Arthritis   . Asthma   . Barrett's esophagus   . Breast cancer ([place]) 1999   LT MASTECTOMY  . Bronchiectasis ([place])   . COPD (chronic obstructive pulmonary disease) ([place])   . Diabetes mellitus ([place])   . Hypercholesterolemia   . Hypertension   . Lymphedema   . Mini stroke ([place])   . Osteoporosis   . Pacemaker     PAST SURGICAL HISTORY:   Past Surgical History:  Procedure Laterality Date  . CARPAL TUNNEL RELEASE     right  . CATARACT EXTRACTION     bilateral  . GALLBLADDER SURGERY    . MASTECTOMY Left 1999   BREAST CA  . PACEMAKER INSERTION Right 03/21/2015   Procedure: INSERTION PACEMAKER;  Surgeon: Isaias Cowman, MD;  Location: [facility] ORS;  Service: Cardiovascular;  Laterality: Right;  . TOTAL HIP ARTHROPLASTY  2001   left  . TRIGGER FINGER RELEASE     bilateral    SOCIAL HISTORY:   Social History   Tobacco Use  . Smoking status: Former Smoker    Packs/day: 1.00   Years: 30.00    Pack years: 30.00    Types: Cigarettes    Last attempt to quit: 12/27/1992    Years since quitting: 25.2  . Smokeless tobacco: Never Used  Substance Use Topics  . Alcohol use: No    FAMILY HISTORY:   Family History  Problem Relation Age of Onset  . Heart disease Paternal Grandmother   . Diabetes Paternal Grandmother   . Heart disease Father   . Heart disease Mother   . Heart disease Maternal Grandmother   . Stroke Maternal Aunt   . Breast cancer Paternal Aunt 94  . Lung cancer Brother 75  . Kidney disease Cousin   . Bladder Cancer Neg Hx     DRUG ALLERGIES:   Allergies  Allergen Reactions  . Aspirin Swelling and Other (See Comments)    Pt states that she has tongue swelling.    . Celebrex [Celecoxib] Anaphylaxis  . Meloxicam Swelling  . Nsaids Anaphylaxis, Other (See Comments) and Nausea And Vomiting    unknown   . Quetiapine Anaphylaxis    intolerant  . Rofecoxib Anaphylaxis  . Sulfa Antibiotics Hives, Itching, Anaphylaxis, Nausea And Vomiting and Other (See Comments)  . Tolmetin Anaphylaxis  . Bactrim [Sulfamethoxazole-Trimethoprim] Hives and Itching  . Biguanide Copolymer Other (See Comments)    Reaction:  Unknown   .  Buprenorphine Hcl Other (See Comments)    Reaction:  Unknown   . Ciprofloxacin Other (See Comments)    Reaction:  Unknown   . Codeine Nausea And Vomiting and Other (See Comments)    Reaction:  Syncope   . Dextromethorphan Hbr Other (See Comments)    Reaction:  GI upset   . Dextromethorphan Polistirex Er Other (See Comments)    Reaction:  GI upset  GI upset  . Furosemide Other (See Comments)    Reaction:  Unknown   . Hydrocodone Other (See Comments)    Reaction:  Unknown   . Metformin Other (See Comments)    Reaction:  Unknown   . Morphine And Related Other (See Comments)    Reaction:  Unknown   . Penicillins Swelling and Other (See Comments)    Pt did not answer the additional questions for this medication because she  does not remember.    . Pregabalin Other (See Comments)    Reaction:  Unknown   . Promethazine Other (See Comments)    Reaction:  Unknown   . Propoxyphene Other (See Comments)    Reaction:  Unknown   . Propoxyphene Other (See Comments)  . Quinolones Other (See Comments)    Reaction:  Unknown   . Seroquel [Quetiapine Fumarate] Other (See Comments)    Reaction:  Unknown   . Serotonin Reuptake Inhibitors (Ssris) Other (See Comments)    Reaction:  Unknown   . Tramadol Nausea Only  . Ultracet [Tramadol-Acetaminophen] Other (See Comments)    Reaction:  Unknown     REVIEW OF SYSTEMS:   Review of Systems  Constitutional: Positive for chills and malaise/fatigue. Negative for fever.  HENT: Negative for sore throat.   Eyes: Positive for blurred vision. Negative for double vision.  Respiratory: Negative for cough, hemoptysis, shortness of breath, wheezing and stridor.   Cardiovascular: Negative for chest pain, palpitations, orthopnea and leg swelling.  Gastrointestinal: Negative for abdominal pain, blood in stool, diarrhea, melena, nausea and vomiting.  Genitourinary: Negative for dysuria, flank pain and hematuria.  Musculoskeletal: Negative for back pain and joint pain.  Skin: Negative for rash.  Neurological: Positive for dizziness. Negative for tingling, tremors, sensory change, focal weakness, seizures, loss of consciousness, weakness and headaches.       Near syncope  Endo/Heme/Allergies: Negative for polydipsia.  Psychiatric/Behavioral: Negative for depression. The patient is not nervous/anxious.     MEDICATIONS AT HOME:   Prior to Admission medications   Medication Sig Start Date End Date Taking? Authorizing Provider  ALPRAZolam Duanne Moron) 0.5 MG tablet Take 1 tablet (0.5 mg total) by mouth 3 (three) times daily as needed for anxiety. 03/22/15  Yes Tama High III, MD  cholecalciferol (VITAMIN D) 1000 units tablet Take 1,000 Units by mouth daily.   Yes [provider]    clopidogrel (PLAVIX) 75 MG tablet Take 1 tablet (75 mg total) by mouth daily. 10/16/16  Yes Fritzi Mandes, MD  insulin lispro protamine-lispro (HUMALOG 75/25 MIX) (75-25) 100 UNIT/ML SUSP injection Inject 15 Units into the skin 2 (two) times daily. Patient taking differently: Inject 10-12 Units into the skin 2 (two) times daily.  03/22/15  Yes Tama High III, MD  KLOR-CON M20 20 MEQ tablet Take 20 mEq by mouth daily.  06/25/15  Yes [provider]  losartan (COZAAR) 25 MG tablet Take 25 mg by mouth daily.   Yes [provider]  montelukast (SINGULAIR) 10 MG tablet Take 1 tablet by mouth daily. 02/11/18  Yes  [provider]  Multiple Vitamin (MULTIVITAMIN WITH MINERALS) TABS tablet Take 1 tablet by mouth daily.   Yes [provider]  simvastatin (ZOCOR) 20 MG tablet Take 20 mg by mouth daily.   Yes [provider]  Acetaminophen (TYLENOL ARTHRITIS PAIN PO) Take 1 tablet by mouth daily.    [provider]  AMBULATORY NON FORMULARY MEDICATION Medication Name: incentive spirometry use 10-15 times daily. Dx:J47.9 10/14/16   Flora Lipps, MD  AMBULATORY NON FORMULARY MEDICATION Medication Name: Please dispense incentive spirometer. DX: J44.1 10/28/17   Flora Lipps, MD  conjugated estrogens (PREMARIN) vaginal cream Place 1 Applicatorful vaginally daily. Apply 0.5mg  (pea-sized amount)  just inside the vaginal introitus with a finger-tip every night for two weeks and then Monday, Wednesday and Friday nights. Patient not taking: Reported on 03/09/2018 12/27/15   Zara Council A, PA-C  fluticasone (FLONASE) 50 MCG/ACT nasal spray Place 2 sprays into both nostrils daily. Patient taking differently: Place 2 sprays into both nostrils daily as needed for rhinitis.  03/04/14 10/14/17  Juanito Doom, MD  glucose blood (BAYER CONTOUR TEST) test strip Use one strip to check glucose three times daily 04/04/16   [provider]  Respiratory Therapy Supplies  (FLUTTER) DEVI Use as directed 02/12/16   Flora Lipps, MD  Simethicone (MYLANTA GAS PO) Take 1 tablet by mouth daily.     [provider]  torsemide (DEMADEX) 20 MG tablet Take 20 mg by mouth daily as needed.  05/20/16   [provider]      VITAL SIGNS:  Blood pressure (!) 193/54, pulse 82, resp. rate (!) 23, height 5' (1.524 m), weight 120.2 kg, SpO2 100 %.  PHYSICAL EXAMINATION:  Physical Exam  GENERAL:  84 y.o.-year-old patient lying in the bed with no acute distress.  Morbid obesity. EYES: Pupils equal, round, reactive to light and accommodation. No scleral icterus. Extraocular muscles intact.  HEENT: Head atraumatic, normocephalic. Oropharynx and nasopharynx clear.  NECK:  Supple, no jugular venous distention. No thyroid enlargement, no tenderness.  LUNGS: Normal breath sounds bilaterally, no wheezing, rales,rhonchi or crepitation. No use of accessory muscles of respiration.  CARDIOVASCULAR: S1, S2 normal. No murmurs, rubs, or gallops.  ABDOMEN: Soft, nontender, nondistended. Bowel sounds present. No organomegaly or mass.  EXTREMITIES: No pedal edema, cyanosis, or clubbing.  NEUROLOGIC: Cranial nerves II through XII are intact. Muscle strength 3/5 in all extremities. Sensation intact. Gait not checked.  PSYCHIATRIC: The patient is alert and oriented x 3.  SKIN: No obvious rash, lesion, or ulcer.   LABORATORY PANEL:   CBC Recent Labs  Lab 03/09/18 1423  WBC 6.2  HGB 13.4  HCT 38.7  PLT 169   ------------------------------------------------------------------------------------------------------------------  Chemistries  Recent Labs  Lab 03/09/18 1423  NA 138  K 4.4  CL 103  CO2 27  GLUCOSE 171*  BUN 32*  CREATININE 1.01*  CALCIUM 9.3  AST 19  ALT 15  ALKPHOS 75  BILITOT 0.8   ------------------------------------------------------------------------------------------------------------------  Cardiac Enzymes Recent Labs  Lab 03/09/18 1423    TROPONINI <0.03   ------------------------------------------------------------------------------------------------------------------  RADIOLOGY:  Dg Chest 2 View  Result Date: 03/09/2018 CLINICAL DATA:  Presyncope. EXAM: CHEST - 2 VIEW COMPARISON:  02/14/2017 FINDINGS: Heart size is within normal limits considering the AP technique. Pulmonary vascularity is normal. Aortic atherosclerosis. Lungs are clear.  No effusions.  No acute bone abnormality. IMPRESSION: No acute abnormalities. Aortic Atherosclerosis (ICD10-I70.0). Electronically Signed   By: Lorriane Shire M.D.   On: 03/09/2018 14:00  Ct Head Wo Contrast  Result Date: 03/09/2018 CLINICAL DATA:  Presyncope EXAM: CT HEAD WITHOUT CONTRAST TECHNIQUE: Contiguous axial images were obtained from the base of the skull through the vertex without intravenous contrast. COMPARISON:  CTA head 04/15/2017 FINDINGS: Brain: There is no mass, hemorrhage or extra-axial collection. The size and configuration of the ventricles and extra-axial CSF spaces are normal. There is no acute or chronic infarction. The brain parenchyma is normal. Vascular: No abnormal hyperdensity of the major intracranial arteries or dural venous sinuses. No intracranial atherosclerosis. Skull: The visualized skull base, calvarium and extracranial soft tissues are normal. Sinuses/Orbits: Right frontal sinus osteoma.  The orbits are normal. IMPRESSION: Normal aging brain. Electronically Signed   By: Ulyses Jarred M.D.   On: 03/09/2018 13:54      IMPRESSION AND PLAN:   Near syncope, unclear etiology.  Possible due to dehydration. The patient will be placed for observation. Telemetry monitor, carotid duplex, cardiology consult.  The patient just had an echocardiograph in Dr. Saralyn Pilar' office this April.  Dehydration.  Hold torsemide, gentle IV fluid support and follow-up BMP.  Accelerated hypertension.  Continue home hypertension medication, labetalol as needed.  Diabetes.  Start  sliding scale.  Continue NovoLog 70/30 10 units twice daily.  All the records are reviewed and case discussed with ED provider. Management plans discussed with the patient, her husband and daughter-in-law and they are in agreement.  CODE STATUS: Full code  TOTAL TIME TAKING CARE OF THIS PATIENT: 38 minutes.    Demetrios Loll M.D on 03/09/2018 at 3:45 PM  Between 7am to 6pm - Pager - [phone]  After 6pm go to [email or web address] - password [email or web address] [facility]  [facility] [place] [facility]  Office  [phone]  CC: Primary care physician; Adin Hector, MD   Note: This dictation was prepared with Dragon dictation along with smaller phrase technology. Any transcriptional errors that result from this process are unin

## 2018-03-10 DIAGNOSIS — R55 Syncope and collapse: Secondary | ICD-10-CM | POA: Diagnosis not present

## 2018-03-10 LAB — GLUCOSE, CAPILLARY
Glucose-Capillary: 113 mg/dL — ABNORMAL HIGH (ref 70–99)
Glucose-Capillary: 163 mg/dL — ABNORMAL HIGH (ref 70–99)

## 2018-03-10 LAB — BASIC METABOLIC PANEL
ANION GAP: 6 (ref 5–15)
BUN: 25 mg/dL — ABNORMAL HIGH (ref 8–23)
CALCIUM: 8.8 mg/dL — AB (ref 8.9–10.3)
CO2: 27 mmol/L (ref 22–32)
CREATININE: 0.94 mg/dL (ref 0.44–1.00)
Chloride: 107 mmol/L (ref 98–111)
GFR calc Af Amer: >60 mL/min (ref >60)
GFR calc non Af Amer: 55 mL/min — ABNORMAL LOW (ref >60)
GLUCOSE: 115 mg/dL — AB (ref 70–99)
Potassium: 4 mmol/L (ref 3.5–5.1)
Sodium: 140 mmol/L (ref 135–145)

## 2018-03-10 NOTE — Care Management Note (Signed)
Case Management Note  Patient Details  Name: Teresa Krueger MRN: [id number] Date of Birth: 10-05-1933  Subjective/Objective:        Patient placed in observation for syncope.  Lives with husband.  Uses wheelchair if leaves house and walker to get from room to room.  States she gets around fine and does not feel the need for home health.  On chronic O2 with [facility].  Patient states that she thinks she and her husband need to start thinking about ALF.  RNCM gave her a list of facilities in [place].  They are having a hard time coming to the decision to leave their home of 59 years but are coming to the realization that it would be best.   Current with Dr. [name], PCP.  Husband is bringing home O2 for discharge.   No further needs at this time.             Action/Plan:   Expected Discharge Date:  03/10/18               Expected Discharge Plan:  Home/Self Care  In-House Referral:     Discharge planning Services  CM Consult  Post Acute Care Choice:    Choice offered to:     DME Arranged:    DME Agency:  (Chronic O2)  HH Arranged:    HH Agency:     Status of Service:  Completed, signed off  If discussed at [place] of Stay Meetings, dates discussed:    Additional Comments:  Elza Rafter, RN 03/10/2018, 11:49 AM

## 2018-03-10 NOTE — Progress Notes (Signed)
IV and tele removed from patient. Discharge instructions given to patient. Verbalized understanding. No acute distress at this time. Husband is at bedside and will transport patient home.

## 2018-03-14 LAB — CULTURE, BLOOD (ROUTINE X 2)
CULTURE: NO GROWTH
Culture: NO GROWTH
SPECIAL REQUESTS: ADEQUATE
SPECIAL REQUESTS: ADEQUATE

## 2018-03-17 NOTE — Discharge Summary (Signed)
[facility] [place] NAME: Teresa Krueger    MR#:  [id number]  DATE OF BIRTH:  February 16, 1934  DATE OF ADMISSION:  03/09/2018 ADMITTING PHYSICIAN: Demetrios Loll, MD  DATE OF DISCHARGE: 03/10/2018  1:06 PM  PRIMARY CARE PHYSICIAN: Adin Hector, MD    ADMISSION DIAGNOSIS:  Shaking chills [R68.83] Pre-syncope [R55]  DISCHARGE DIAGNOSIS:  Active Problems:   Syncope   SECONDARY DIAGNOSIS:   Past Medical History:  Diagnosis Date  . Arthritis   . Asthma   . Barrett's esophagus   . Breast cancer ([place]) 1999   LT MASTECTOMY  . Bronchiectasis ([place])   . COPD (chronic obstructive pulmonary disease) ([place])   . Diabetes mellitus ([place])   . Hypercholesterolemia   . Hypertension   . Lymphedema   . Mini stroke ([place])   . Osteoporosis   . Pacemaker     HOSPITAL COURSE:   Near syncope, unclear etiology.  Possible due to dehydration. placed for observation. Telemetry monitor, carotid duplex, cardiology consult.  The patient just had an echocardiograph in Dr. Saralyn Pilar' office this April. had some blockage on right carotid artery, but it was not matching with past CTA neck. Advised to follow with PMD and vascular. Pt was asymptomatic next day.  Dehydration.  Hold torsemide, gentle IV fluid support and follow-up BMP.  Accelerated hypertension.  Continue home hypertension medication, labetalol as needed.  Diabetes.  Start sliding scale.  Continue NovoLog 70/30 10 units twice daily.  DISCHARGE CONDITIONS:   Stable.  CONSULTS OBTAINED:  Treatment Team:  Yolonda Kida, MD  DRUG ALLERGIES:   Allergies  Allergen Reactions  . Aspirin Swelling and Other (See Comments)    Pt states that she has tongue swelling.    . Celebrex [Celecoxib] Anaphylaxis  . Meloxicam Swelling  . Nsaids Anaphylaxis, Other (See Comments) and Nausea And Vomiting    unknown   . Quetiapine Anaphylaxis    intolerant  . Rofecoxib Anaphylaxis  . Sulfa  Antibiotics Hives, Itching, Anaphylaxis, Nausea And Vomiting and Other (See Comments)  . Tolmetin Anaphylaxis  . Bactrim [Sulfamethoxazole-Trimethoprim] Hives and Itching  . Biguanide Copolymer Other (See Comments)    Reaction:  Unknown   . Buprenorphine Hcl Other (See Comments)    Reaction:  Unknown   . Ciprofloxacin Other (See Comments)    Reaction:  Unknown   . Codeine Nausea And Vomiting and Other (See Comments)    Reaction:  Syncope   . Dextromethorphan Hbr Other (See Comments)    Reaction:  GI upset   . Dextromethorphan Polistirex Er Other (See Comments)    Reaction:  GI upset  GI upset  . Furosemide Other (See Comments)    Reaction:  Unknown   . Hydrocodone Other (See Comments)    Reaction:  Unknown   . Metformin Other (See Comments)    Reaction:  Unknown   . Morphine And Related Other (See Comments)    Reaction:  Unknown   . Penicillins Swelling and Other (See Comments)    Pt did not answer the additional questions for this medication because she does not remember.    . Pregabalin Other (See Comments)    Reaction:  Unknown   . Promethazine Other (See Comments)    Reaction:  Unknown   . Propoxyphene Other (See Comments)    Reaction:  Unknown   . Propoxyphene Other (See Comments)  . Quinolones Other (See Comments)    Reaction:  Unknown   .  Seroquel [Quetiapine Fumarate] Other (See Comments)    Reaction:  Unknown   . Serotonin Reuptake Inhibitors (Ssris) Other (See Comments)    Reaction:  Unknown   . Tramadol Nausea Only  . Ultracet [Tramadol-Acetaminophen] Other (See Comments)    Reaction:  Unknown     DISCHARGE MEDICATIONS:   Allergies as of 03/10/2018      Reactions   Aspirin Swelling, Other (See Comments)   Pt states that she has tongue swelling.     Celebrex [celecoxib] Anaphylaxis   Meloxicam Swelling   Nsaids Anaphylaxis, Other (See Comments), Nausea And Vomiting   unknown   Quetiapine Anaphylaxis   intolerant   Rofecoxib Anaphylaxis   Sulfa  Antibiotics Hives, Itching, Anaphylaxis, Nausea And Vomiting, Other (See Comments)   Tolmetin Anaphylaxis   Bactrim [sulfamethoxazole-trimethoprim] Hives, Itching   Biguanide Copolymer Other (See Comments)   Reaction:  Unknown    Buprenorphine Hcl Other (See Comments)   Reaction:  Unknown    Ciprofloxacin Other (See Comments)   Reaction:  Unknown    Codeine Nausea And Vomiting, Other (See Comments)   Reaction:  Syncope    Dextromethorphan Hbr Other (See Comments)   Reaction:  GI upset    Dextromethorphan Polistirex Er Other (See Comments)   Reaction:  GI upset  GI upset   Furosemide Other (See Comments)   Reaction:  Unknown    Hydrocodone Other (See Comments)   Reaction:  Unknown    Metformin Other (See Comments)   Reaction:  Unknown    Morphine And Related Other (See Comments)   Reaction:  Unknown    Penicillins Swelling, Other (See Comments)   Pt did not answer the additional questions for this medication because she does not remember.     Pregabalin Other (See Comments)   Reaction:  Unknown    Promethazine Other (See Comments)   Reaction:  Unknown    Propoxyphene Other (See Comments)   Reaction:  Unknown    Propoxyphene Other (See Comments)   Quinolones Other (See Comments)   Reaction:  Unknown    Seroquel [quetiapine Fumarate] Other (See Comments)   Reaction:  Unknown    Serotonin Reuptake Inhibitors (ssris) Other (See Comments)   Reaction:  Unknown    Tramadol Nausea Only   Ultracet [tramadol-acetaminophen] Other (See Comments)   Reaction:  Unknown       Medication List    TAKE these medications   acetaminophen 650 MG CR tablet Commonly known as:  TYLENOL Take 650 mg by mouth every morning.   ALPRAZolam 0.5 MG tablet Commonly known as:  XANAX Take 1 tablet (0.5 mg total) by mouth 3 (three) times daily as needed for anxiety. What changed:  additional instructions   AMBULATORY NON FORMULARY MEDICATION Medication Name: incentive spirometry use 10-15 times  daily. Dx:J47.9   AMBULATORY NON FORMULARY MEDICATION Medication Name: Please dispense incentive spirometer. DX: J44.1   BAYER CONTOUR TEST test strip Generic drug:  glucose blood Use one strip to check glucose three times daily   cholecalciferol 1000 units tablet Commonly known as:  VITAMIN D Take 1,000 Units by mouth daily.   clopidogrel 75 MG tablet Commonly known as:  PLAVIX Take 1 tablet (75 mg total) by mouth daily.   conjugated estrogens vaginal cream Commonly known as:  PREMARIN Place 1 Applicatorful vaginally daily. Apply 0.5mg  (pea-sized amount)  just inside the vaginal introitus with a finger-tip every night for two weeks and then Monday, Wednesday and Friday nights.   fluticasone 50 MCG/ACT  nasal spray Commonly known as:  FLONASE Place 2 sprays into both nostrils daily. What changed:    when to take this  reasons to take this   FLUTTER Devi Use as directed   insulin lispro protamine-lispro (75-25) 100 UNIT/ML Susp injection Commonly known as:  HUMALOG 75/25 MIX Inject 15 Units into the skin 2 (two) times daily. What changed:  additional instructions   KLOR-CON M20 20 MEQ tablet Generic drug:  potassium chloride SA Take 20 mEq by mouth every other day.   losartan 25 MG tablet Commonly known as:  COZAAR Take 25 mg by mouth daily.   montelukast 10 MG tablet Commonly known as:  SINGULAIR Take 5 mg by mouth daily.   multivitamin with minerals Tabs tablet Take 1 tablet by mouth daily.   MYLANTA GAS PO Take 1 tablet by mouth daily.   simvastatin 20 MG tablet Commonly known as:  ZOCOR Take 20 mg by mouth daily.   torsemide 20 MG tablet Commonly known as:  DEMADEX Take 20 mg by mouth every other day.        DISCHARGE INSTRUCTIONS:    Follow with PMD in 1-2 weeks.  If you experience worsening of your admission symptoms, develop shortness of breath, life threatening emergency, suicidal or homicidal thoughts you must seek medical attention  immediately by calling 911 or calling your MD immediately  if symptoms less severe.  You Must read complete instructions/literature along with all the possible adverse reactions/side effects for all the Medicines you take and that have been prescribed to you. Take any new Medicines after you have completely understood and accept all the possible adverse reactions/side effects.   Please note  You were cared for by a hospitalist during your hospital stay. If you have any questions about your discharge medications or the care you received while you were in the hospital after you are discharged, you can call the unit and asked to speak with the hospitalist on call if the hospitalist that took care of you is not available. Once you are discharged, your primary care physician will handle any further medical issues. Please note that NO REFILLS for any discharge medications will be authorized once you are discharged, as it is imperative that you return to your primary care physician (or establish a relationship with a primary care physician if you do not have one) for your aftercare needs so that they can reassess your need for medications and monitor your lab values.    Today   CHIEF COMPLAINT:   Chief Complaint  Patient presents with  . Near Syncope    HISTORY OF PRESENT ILLNESS:  Teresa Krueger  is a 84 y.o. female with a known history of hypertension, hyperlipidemia, diabetes, COPD, mini stroke and status post pacemaker placement.  The patient was sent to ED after near syncope episode at home.  She was sitting diagnosis eating breakfast and drinking coffee when she had a sudden onset of lightheadedness and almost pass out.  She also complains of one episode of diarrhea, nausea and chills but denies any fever, cough or shortness of breath.  Work-up so far in the ED patient is unremarkable so far.  Normal request admission.  VITAL SIGNS:  Blood pressure (!) 155/42, pulse 72, temperature 98.2 F  (36.8 C), temperature source Oral, resp. rate 18, height 5\' 2"  (1.575 m), weight 120.2 kg, SpO2 99 %.  I/O:  No intake or output data in the 24 hours ending 03/17/18 0738  PHYSICAL EXAMINATION:  GENERAL:  84 y.o.-year-old patient lying in the bed with no acute distress.  EYES: Pupils equal, round, reactive to light and accommodation. No scleral icterus. Extraocular muscles intact.  HEENT: Head atraumatic, normocephalic. Oropharynx and nasopharynx clear.  NECK:  Supple, no jugular venous distention. No thyroid enlargement, no tenderness.  LUNGS: Normal breath sounds bilaterally, no wheezing, rales,rhonchi or crepitation. No use of accessory muscles of respiration.  CARDIOVASCULAR: S1, S2 normal. No murmurs, rubs, or gallops.  ABDOMEN: Soft, non-tender, non-distended. Bowel sounds present. No organomegaly or mass.  EXTREMITIES: No pedal edema, cyanosis, or clubbing.  NEUROLOGIC: Cranial nerves II through XII are intact. Muscle strength 5/5 in all extremities. Sensation intact. Gait not checked.  PSYCHIATRIC: The patient is alert and oriented x 3.  SKIN: No obvious rash, lesion, or ulcer.   DATA REVIEW:   CBC No results for input(s): WBC, HGB, HCT, PLT in the last 168 hours.  Chemistries  No results for input(s): NA, K, CL, CO2, GLUCOSE, BUN, CREATININE, CALCIUM, MG, AST, ALT, ALKPHOS, BILITOT in the last 168 hours.  Invalid input(s): GFRCGP  Cardiac Enzymes No results for input(s): TROPONINI in the last 168 hours.  Microbiology Results  Results for orders placed or performed during the hospital encounter of 03/09/18  Blood culture (routine x 2)     Status: None   Collection Time: 03/09/18  1:36 PM  Result Value Ref Range Status   Specimen Description BLOOD LEFT ARM  Final   Special Requests   Final    BOTTLES DRAWN AEROBIC AND ANAEROBIC Blood Culture adequate volume   Culture   Final    NO GROWTH 5 DAYS Performed at [facility], [address] [place], [place]  [address]    Report Status 03/14/2018 FINAL  Final  Blood culture (routine x 2)     Status: None   Collection Time: 03/09/18  2:23 PM  Result Value Ref Range Status   Specimen Description BLOOD LEFT [facility]  Final   Special Requests   Final    BOTTLES DRAWN AEROBIC AND ANAEROBIC Blood Culture adequate volume   Culture   Final    NO GROWTH 5 DAYS Performed at [facility], [address], [place], [place] [address]    Report Status 03/14/2018 FINAL  Final    RADIOLOGY:  No results found.  EKG:   Orders placed or performed during the hospital encounter of 03/09/18  . ED EKG  . ED EKG  . EKG 12-Lead  . EKG 12-Lead  . EKG 12-Lead  . EKG 12-Lead  . EKG 12-Lead  . EKG 12-Lead  . EKG      Management plans discussed with the patient, family and they are in agreement.  CODE STATUS:  Code Status History    Date Active Date Inactive Code Status Order ID Comments User Context   03/09/2018 1637 03/10/2018 1606 Full Code [id number]  Demetrios Loll, MD Inpatient   10/14/2016 2248 10/15/2016 1905 Full Code [id number]  Dustin Flock, MD Inpatient   03/21/2015 1609 03/22/2015 2130 Full Code [id number]  Isaias Cowman, MD Inpatient   03/16/2015 2253 03/21/2015 1609 Full Code [id number]  Hower, Aaron Mose, MD ED    Advance Directive Documentation     Most Recent Value  Type of Advance Directive  Healthcare Power of Attorney, Living will  Pre-existing out of facility DNR order (yellow form or pink MOST form)  -  "MOST" Form in Place?  -      [place]  OF THIS PATIENT: 35 minutes.    Vaughan Basta M.D on 03/17/2018 at 7:38 AM  Between 7am to 6pm - Pager - [phone]  After 6pm go to [email or web address] - password [email or web address] [facility] [place] [facility]  Office  [phone]  CC: Primary care physician; Adin Hector, MD   Note: This dictation was prepared with Dragon dictation along with smaller phrase technology. Any transcriptional errors that result from  this process are unintentional.

## 2018-03-20 DIAGNOSIS — R42 Dizziness and giddiness: Secondary | ICD-10-CM | POA: Insufficient documentation

## 2018-04-23 ENCOUNTER — Ambulatory Visit: Payer: Medicare Other | Admitting: Internal Medicine

## 2018-04-24 ENCOUNTER — Ambulatory Visit (INDEPENDENT_AMBULATORY_CARE_PROVIDER_SITE_OTHER): Payer: Medicare Other | Admitting: Internal Medicine

## 2018-04-24 ENCOUNTER — Encounter: Payer: Self-pay | Admitting: Internal Medicine

## 2018-04-24 VITALS — BP 150/62 | HR 65 | Ht 62.0 in | Wt 265.0 lb

## 2018-04-24 DIAGNOSIS — J479 Bronchiectasis, uncomplicated: Secondary | ICD-10-CM

## 2018-04-24 NOTE — Progress Notes (Signed)
Subjective:    Patient ID: Teresa Krueger, female    DOB: Mar 25, 1934, 84 y.o.   MRN: [id number]  Synopsis: cristela stalder first saw the [facility] pulmonary clinic in July 2013 for cough. She has a one pack per day smoking history x30 years and quit in the 1990s. She had simple spirometry in July 2013 which showed no evidence of COPD. She has a history of childhood pertussis  She has postnasal drip and gastroesophageal reflux disease with Barrett's esophagus.   CC: Follow-up bronchiectasis   HPI No new complaints Chronic cough chronic shortness of breath and dyspnea on exertion is stable  Uses flutter valve daily but infrequently  uses albuterol infrequently  No signs of infection at this time No significant change in her shortness of breath and dyspnea on exertion History of pacemaker placement and wheelchair bound   Patient has recurrent TIAs in the past Has bilateral carotid stenosis we will see [facility] vascular surgery next week     Review of Systems  Constitutional: Negative for chills, diaphoresis, fatigue and fever.  HENT: Negative for congestion, postnasal drip and rhinorrhea.   Respiratory: Positive for shortness of breath. Negative for cough, chest tightness, wheezing and stridor.   Cardiovascular: Positive for chest pain and leg swelling.  All other systems reviewed and are negative.     Objective:   Physical Exam  Constitutional: She appears well-developed and well-nourished. No distress.  HENT:  Head: Normocephalic and atraumatic.  Eyes: Pupils are equal, round, and reactive to light.  Neck: Normal range of motion. Neck supple.  Cardiovascular: Normal rate and regular rhythm.  No murmur heard. Pulmonary/Chest: Effort normal and breath sounds normal. No respiratory distress. She has no wheezes. She has no rales.  Musculoskeletal: She exhibits edema.  Skin: She is not diaphoretic.   BP (!) 150/62 (BP Location: Right Arm, Cuff Size: Normal)   Pulse  65   Ht 5\' 2"  (1.575 m)   Wt 265 lb (120.2 kg)   SpO2 97%   BMI 48.47 kg/m    04/2012 CT chest >> no emphysema, there is some bronchiectasis in the RML and Lingula ([place] read) 04/2012 CT sinuses >> no significant signs of sinusitis 04/2012 CT chest [facility] >> no emphysema or nodules, mild RML and lingula bronchiectasis  04/2012 AFB culture x3 negative February 2016 CT angiogram chest> mild lingular bronchiectasis, scattered pulmonary nodules bilaterally, largest is 6 mm, no pulmonary embolism Feb 2017 CTchest stabel nodules, recommend repeat in 6 months. 01/2016 Ct chest 1. Stable noncalcified subpleural nodule in the periphery of the right upper lobe.  2. Moderate thoracic aortic atherosclerosis. 3. Calcifications in the distribution of the left anterior descending and right coronary arteries.   Assessment & Plan:  84 year old morbidly obese white female with bronchiectasis that seems to be stable at this time with chronic hypoxic respiratory failure in setting of recurrent TIAs bilateral carotid stenosis  #1 bronchiectasis mild Stable at this time No exacerbation at this time Advised to use flutter valve more frequently 10-15 times per day Advised to use incentive spirometry 10-15 times a day No indication for steroids at this time No indication for antibiotics Use albuterol as needed  #2 chronic hypoxic respiratory failure from obesity and bronchiectasis Continue oxygen therapy at night  #3 history of pulmonary nodules No further CT scans at this time   #4 TIAs with carotid stenosis Follow-up with [facility] vascular surgery next week  Preop assessment patient is moderate risk for pulmonary complications  Patient/Family are satisfied with Plan of action and management. All questions answered  Corrin Parker, M.D.  [facility]  Medical Director [place] Director [place] [facility] Cardio-Pulmonary Department

## 2018-04-24 NOTE — Patient Instructions (Signed)
Flutter Valve 10-15 times per day  Incentive Spirometry 10-15 times per day  Follow up with [facility] for Carotid artery stenosis  Continue Oxygen as prescribed

## 2018-04-30 ENCOUNTER — Ambulatory Visit: Payer: Medicare Other | Admitting: Podiatry

## 2018-06-15 ENCOUNTER — Telehealth: Payer: Self-pay | Admitting: Dietician

## 2018-06-15 NOTE — Telephone Encounter (Signed)
Called patient to return voicemail message left on 06/12/18 asking for examples of natural (food) diuretics. Unable to reach patient or leave a message. Will try again later.

## 2018-06-22 ENCOUNTER — Telehealth: Payer: Self-pay | Admitting: Internal Medicine

## 2018-06-22 NOTE — Telephone Encounter (Signed)
Pt scheduled 06/25/18 at 9:15 with DK.

## 2018-06-22 NOTE — Telephone Encounter (Signed)
Patient wants asap appt for congestion and productive cough [facility]

## 2018-06-25 ENCOUNTER — Ambulatory Visit: Payer: Medicare Other | Admitting: Internal Medicine

## 2018-06-29 ENCOUNTER — Encounter: Payer: Self-pay | Admitting: Internal Medicine

## 2018-08-03 ENCOUNTER — Telehealth: Payer: Self-pay | Admitting: Internal Medicine

## 2018-08-03 MED ORDER — FLUTTER DEVI: 0 refills | Status: DC

## 2018-08-03 NOTE — Telephone Encounter (Addendum)
Called and spoke to pt. Pt is requesting Rx for flutter device, as she put her current device in the dishwasher and it no longer works.  Rx for flutter device has been printed and placed in Dr. Zoila Shutter folder for signature.  Pt aware that our office will contact her once Rx is signed.  Pt would like to pick Rx up on 08/05/18.  Routing to DK to make aware.

## 2018-08-04 NOTE — Telephone Encounter (Signed)
Pt notified rx flutter valve ready. She is requesting it be faxed to [facility]. Will fax. Nothing further needed.

## 2018-10-09 ENCOUNTER — Other Ambulatory Visit: Payer: Self-pay

## 2018-10-09 ENCOUNTER — Other Ambulatory Visit: Payer: Self-pay | Admitting: Internal Medicine

## 2018-10-09 ENCOUNTER — Ambulatory Visit
Admission: RE | Admit: 2018-10-09 | Discharge: 2018-10-09 | Disposition: A | Discharge status: Home / Self Care | Payer: Medicare Other | Source: Ambulatory Visit | Attending: Internal Medicine | Admitting: Internal Medicine

## 2018-10-09 DIAGNOSIS — M7989 Other specified soft tissue disorders: Secondary | ICD-10-CM | POA: Diagnosis present

## 2018-12-07 ENCOUNTER — Telehealth: Payer: Self-pay | Admitting: Internal Medicine

## 2018-12-07 ENCOUNTER — Other Ambulatory Visit: Payer: Self-pay

## 2018-12-07 ENCOUNTER — Encounter: Payer: Self-pay | Admitting: Emergency Medicine

## 2018-12-07 ENCOUNTER — Emergency Department
Admission: EM | Admit: 2018-12-07 | Discharge: 2018-12-07 | Disposition: A | Discharge status: Home / Self Care | Payer: Medicare Other | Attending: Emergency Medicine | Admitting: Emergency Medicine

## 2018-12-07 ENCOUNTER — Emergency Department: Payer: Medicare Other

## 2018-12-07 DIAGNOSIS — Z9981 Dependence on supplemental oxygen: Secondary | ICD-10-CM | POA: Insufficient documentation

## 2018-12-07 DIAGNOSIS — R0789 Other chest pain: Secondary | ICD-10-CM | POA: Insufficient documentation

## 2018-12-07 DIAGNOSIS — I129 Hypertensive chronic kidney disease with stage 1 through stage 4 chronic kidney disease, or unspecified chronic kidney disease: Secondary | ICD-10-CM | POA: Diagnosis not present

## 2018-12-07 DIAGNOSIS — Z8673 Personal history of transient ischemic attack (TIA), and cerebral infarction without residual deficits: Secondary | ICD-10-CM | POA: Diagnosis not present

## 2018-12-07 DIAGNOSIS — E119 Type 2 diabetes mellitus without complications: Secondary | ICD-10-CM | POA: Diagnosis not present

## 2018-12-07 DIAGNOSIS — R0602 Shortness of breath: Secondary | ICD-10-CM

## 2018-12-07 DIAGNOSIS — J449 Chronic obstructive pulmonary disease, unspecified: Secondary | ICD-10-CM | POA: Diagnosis not present

## 2018-12-07 DIAGNOSIS — Z79899 Other long term (current) drug therapy: Secondary | ICD-10-CM | POA: Insufficient documentation

## 2018-12-07 DIAGNOSIS — Z96642 Presence of left artificial hip joint: Secondary | ICD-10-CM | POA: Diagnosis not present

## 2018-12-07 DIAGNOSIS — Z87891 Personal history of nicotine dependence: Secondary | ICD-10-CM | POA: Insufficient documentation

## 2018-12-07 DIAGNOSIS — Z853 Personal history of malignant neoplasm of breast: Secondary | ICD-10-CM | POA: Diagnosis not present

## 2018-12-07 DIAGNOSIS — R079 Chest pain, unspecified: Secondary | ICD-10-CM

## 2018-12-07 DIAGNOSIS — N183 Chronic kidney disease, stage 3 (moderate): Secondary | ICD-10-CM | POA: Diagnosis not present

## 2018-12-07 DIAGNOSIS — Z950 Presence of cardiac pacemaker: Secondary | ICD-10-CM | POA: Diagnosis not present

## 2018-12-07 DIAGNOSIS — Z794 Long term (current) use of insulin: Secondary | ICD-10-CM | POA: Diagnosis not present

## 2018-12-07 LAB — URINALYSIS, COMPLETE (UACMP) WITH MICROSCOPIC
Bacteria, UA: NONE SEEN
Bilirubin Urine: NEGATIVE
Glucose, UA: NEGATIVE mg/dL
Hgb urine dipstick: NEGATIVE
Ketones, ur: NEGATIVE mg/dL
Nitrite: NEGATIVE
Protein, ur: 100 mg/dL — AB
Specific Gravity, Urine: 1.014 (ref 1.005–1.030)
pH: 6 (ref 5.0–8.0)

## 2018-12-07 LAB — COMPREHENSIVE METABOLIC PANEL
ALT: 17 U/L (ref 0–44)
AST: 21 U/L (ref 15–41)
Albumin: 4.2 g/dL (ref 3.5–5.0)
Alkaline Phosphatase: 79 U/L (ref 38–126)
Anion gap: 9 (ref 5–15)
BUN: 31 mg/dL — ABNORMAL HIGH (ref 8–23)
CO2: 28 mmol/L (ref 22–32)
Calcium: 9.5 mg/dL (ref 8.9–10.3)
Chloride: 103 mmol/L (ref 98–111)
Creatinine, Ser: 1.14 mg/dL — ABNORMAL HIGH (ref 0.44–1.00)
GFR calc Af Amer: 51 mL/min — ABNORMAL LOW (ref >60)
GFR calc non Af Amer: 44 mL/min — ABNORMAL LOW (ref >60)
Glucose, Bld: 230 mg/dL — ABNORMAL HIGH (ref 70–99)
Potassium: 4.8 mmol/L (ref 3.5–5.1)
Sodium: 140 mmol/L (ref 135–145)
Total Bilirubin: 0.6 mg/dL (ref 0.3–1.2)
Total Protein: 7.1 g/dL (ref 6.5–8.1)

## 2018-12-07 LAB — CBC
HCT: 40.8 % (ref 36.0–46.0)
Hemoglobin: 13 g/dL (ref 12.0–15.0)
MCH: 28.6 pg (ref 26.0–34.0)
MCHC: 31.9 g/dL (ref 30.0–36.0)
MCV: 89.9 fL (ref 80.0–100.0)
Platelets: 188 10*3/uL (ref 150–400)
RBC: 4.54 MIL/uL (ref 3.87–5.11)
RDW: 13.2 % (ref 11.5–15.5)
WBC: 5.2 10*3/uL (ref 4.0–10.5)
nRBC: 0 % (ref 0.0–0.2)

## 2018-12-07 LAB — TROPONIN I: Troponin I: <0.03 ng/mL (ref <0.03)

## 2018-12-07 MED ORDER — SODIUM CHLORIDE 0.9% FLUSH: 3.0000 mL | Freq: Once | INTRAVENOUS | Status: DC

## 2018-12-07 NOTE — ED Provider Notes (Signed)
[facility] Emergency Department Provider Note   ____________________________________________   I have reviewed the triage vital signs and the nursing notes.   HISTORY  Chief Complaint Chest Pain   History limited by: Not Limited   HPI Teresa Krueger is a 85 y.o. female who presents to the emergency department today because of concerns for chest discomfort and some shortness of breath.  She states it has happened the past 3 nights.  She will wake up a couple times a night to go to the bathroom.  During this time she has noticed some discomfort in her central chest as well as some shortness of breath.  She did have some of this discomfort today as well.  She does have a history of hypoxia at night states that she is on oxygen.  She denies any fevers.   Records reviewed. Per medical record review patient has a history of COPD, DM.   Past Medical History:  Diagnosis Date  . Arthritis   . Asthma   . Barrett's esophagus   . Breast cancer ([place]) 1999   LT MASTECTOMY  . Bronchiectasis ([place])   . COPD (chronic obstructive pulmonary disease) ([place])   . Diabetes mellitus ([place])   . Hypercholesterolemia   . Hypertension   . Lymphedema   . Mini stroke ([place])   . Osteoporosis   . Pacemaker     Patient Active Problem List   Diagnosis Date Noted  . Syncope 03/09/2018  . CKD (chronic kidney disease) stage 3, GFR 30-59 ml/min (HCC) 02/14/2017  . Carotid artery stenosis 10/29/2016  . TIA (transient ischemic attack) 10/14/2016  . Swelling of limb 04/10/2016  . Varicose veins of leg with pain, bilateral 04/10/2016  . Lymphedema 04/10/2016  . Osteoarthritis 03/19/2016  . Osteoporosis 03/19/2016  . Coronary artery calcification seen on CAT scan 02/08/2016  . Incidental lung nodule 02/08/2016  . Thoracic aortic atherosclerosis ([place]) 02/08/2016  . S/P placement of cardiac pacemaker 03/30/2015  . Morbid obesity ([place]) 03/17/2015  . Hypersomnia with sleep apnea  03/17/2015  . Mobitz type 2 second degree AV block 03/16/2015  . Multiple pulmonary nodules 09/13/2014  . Lung field abnormal 09/13/2014  . Barrett's esophagus 12/21/2013  . History of adenomatous polyp of colon 12/21/2013  . Hx of adenomatous colonic polyps 12/21/2013  . Benign essential hypertension 11/30/2013  . Acromioclavicular joint arthritis 11/23/2013  . Glenohumeral arthritis 11/23/2013  . Chest pain 10/21/2013  . Adult body mass index 50.0-59.9 ([place]) 02/09/2013  . Morbid obesity with BMI of 50.0-59.9, adult ([place]) 02/09/2013  . Primary localized osteoarthrosis, lower leg 02/09/2013  . Bronchiectasis ([place]) 05/19/2012  . Chronic bronchitis 12/31/2011  . Cough 12/31/2011  . GERD (gastroesophageal reflux disease) 12/31/2011  . Diabetes mellitus ([place])   . Hypertension     Past Surgical History:  Procedure Laterality Date  . CARPAL TUNNEL RELEASE     right  . CATARACT EXTRACTION     bilateral  . GALLBLADDER SURGERY    . MASTECTOMY Left 1999   BREAST CA  . PACEMAKER INSERTION Right 03/21/2015   Procedure: INSERTION PACEMAKER;  Surgeon: Isaias Cowman, MD;  Location: [facility] ORS;  Service: Cardiovascular;  Laterality: Right;  . TOTAL HIP ARTHROPLASTY  2001   left  . TRIGGER FINGER RELEASE     bilateral    Prior to Admission medications   Medication Sig Start Date End Date Taking? Authorizing Provider  acetaminophen (TYLENOL) 650 MG CR tablet Take 650 mg by mouth every morning.  [provider]  ALPRAZolam Duanne Moron) 0.5 MG tablet Take 1 tablet (0.5 mg total) by mouth 3 (three) times daily as needed for anxiety. Patient taking differently: Take 0.5 mg by mouth 3 (three) times daily as needed for anxiety. Take 1 tablet (0.5MG ) by mouth twice daily - may take a third tablet daily if needed for anxiety 03/22/15   Adin Hector, MD  AMBULATORY NON FORMULARY MEDICATION Medication Name: incentive spirometry use 10-15 times daily. Dx:J47.9 10/14/16   Flora Lipps, MD   AMBULATORY NON FORMULARY MEDICATION Medication Name: Please dispense incentive spirometer. DX: J44.1 10/28/17   Flora Lipps, MD  clopidogrel (PLAVIX) 75 MG tablet Take 1 tablet (75 mg total) by mouth daily. 10/16/16   Fritzi Mandes, MD  fluticasone (FLONASE) 50 MCG/ACT nasal spray Place 2 sprays into both nostrils daily. Patient taking differently: Place 2 sprays into both nostrils daily as needed for rhinitis.  03/04/14 03/09/18  Juanito Doom, MD  glucose blood (BAYER CONTOUR TEST) test strip Use one strip to check glucose three times daily 04/04/16   [provider]  insulin lispro protamine-lispro (HUMALOG 75/25 MIX) (75-25) 100 UNIT/ML SUSP injection Inject 15 Units into the skin 2 (two) times daily. Patient taking differently: Inject 15 Units into the skin 2 (two) times daily. Inject up to 15u under the skin twice daily 03/22/15   Tama High III, MD  KLOR-CON M20 20 MEQ tablet Take 20 mEq by mouth every other day.     [provider]  losartan (COZAAR) 25 MG tablet Take 25 mg by mouth daily.    [provider]  Multiple Vitamin (MULTIVITAMIN WITH MINERALS) TABS tablet Take 1 tablet by mouth daily.    [provider]  predniSONE (STERAPRED UNI-PAK 21 TAB) 10 MG (21) TBPK tablet 6 day taper pack/take as directed 04/22/18   [provider]  Respiratory Therapy Supplies (FLUTTER) DEVI Use as directed 02/12/16   Flora Lipps, MD  Respiratory Therapy Supplies (FLUTTER) DEVI Use as directed 08/03/18   Flora Lipps, MD  Simethicone (MYLANTA GAS PO) Take 1 tablet by mouth daily.     [provider]  simvastatin (ZOCOR) 20 MG tablet Take 20 mg by mouth daily.    [provider]  torsemide (DEMADEX) 20 MG tablet Take 20 mg by mouth every other day.     [provider]    Allergies Aspirin, Celebrex [celecoxib], Meloxicam, Nsaids, Quetiapine, Rofecoxib, Sulfa antibiotics, Tolmetin, Bactrim [sulfamethoxazole-trimethoprim],  Biguanide copolymer, Buprenorphine hcl, Ciprofloxacin, Codeine, Dextromethorphan hbr, Dextromethorphan polistirex er, Furosemide, Hydrocodone, Metformin, Morphine and related, Penicillins, Pregabalin, Promethazine, Propoxyphene, Propoxyphene, Quinolones, Seroquel [quetiapine fumarate], Serotonin reuptake inhibitors (ssris), Tramadol, and Ultracet [tramadol-acetaminophen]  Family History  Problem Relation Age of Onset  . Heart disease Paternal Grandmother   . Diabetes Paternal Grandmother   . Heart disease Father   . Heart disease Mother   . Heart disease Maternal Grandmother   . Stroke Maternal Aunt   . Breast cancer Paternal Aunt 94  . Lung cancer Brother 32  . Kidney disease Cousin   . Bladder Cancer Neg Hx     Social History Social History   Tobacco Use  . Smoking status: Former Smoker    Packs/day: 1.00    Years: 30.00    Pack years: 30.00    Types: Cigarettes    Quit date: 12/27/1992    Years since quitting: 25.9  . Smokeless tobacco: Never Used  Substance Use Topics  . Alcohol use: No  .  Drug use: No    Review of Systems Constitutional: No fever/chills Eyes: No visual changes. ENT: No sore throat. Cardiovascular: Positive for chest pressure. Respiratory: Positive for shortness of breath. Gastrointestinal: No abdominal pain.  No nausea, no vomiting.  No diarrhea.   Genitourinary: Negative for dysuria. Musculoskeletal: Negative for back pain. Skin: Negative for rash. Neurological: Negative for headaches, focal weakness or numbness.  ____________________________________________   PHYSICAL EXAM:  VITAL SIGNS: ED Triage Vitals [12/07/18 1837]  Enc Vitals Group     BP (!) 185/52     Pulse Rate 73     Resp 18     Temp 98.2 F (36.8 C)     Temp Source Oral     SpO2 96 %     Weight 267 lb (121.1 kg)     Height 5\' 2"  (1.575 m)     Head Circumference      Peak Flow      Pain Score 2   Constitutional: Alert and oriented.  Eyes: Conjunctivae are normal.   ENT      Head: Normocephalic and atraumatic.      Nose: No congestion/rhinnorhea.      Mouth/Throat: Mucous membranes are moist.      Neck: No stridor. Hematological/Lymphatic/Immunilogical: No cervical lymphadenopathy. Cardiovascular: Normal rate, regular rhythm.  No murmurs, rubs, or gallops.  Respiratory: Normal respiratory effort without tachypnea nor retractions. Breath sounds are clear and equal bilaterally. No wheezes/rales/rhonchi. Gastrointestinal: Soft and non tender. No rebound. No guarding.  Genitourinary: Deferred Musculoskeletal: Normal range of motion in all extremities. Tender lower extremities.  Neurologic:  Normal speech and language. No gross focal neurologic deficits are appreciated.  Skin:  Skin is warm, dry and intact. No rash noted. Psychiatric: Mood and affect are normal. Speech and behavior are normal. Patient exhibits appropriate insight and judgment.  ____________________________________________    LABS (pertinent positives/negatives)  Trop <0.03 CBC wbc 5.2, hgb 13.0, plt 188 CMP glu 230, cr 1.14  ____________________________________________   EKG  I, Nance Pear, attending physician, personally viewed and interpreted this EKG  EKG Time: 1840 Rate: 75 Rhythm: atrial sensed ventricular paced rhythm Axis: left axis deviation Intervals: qtc 477 QRS: wide ST changes: no st elevation Impression: abnormal ekg   ____________________________________________    RADIOLOGY  CXR No acute disease   ____________________________________________   PROCEDURES  Procedures  ____________________________________________   INITIAL IMPRESSION / ASSESSMENT AND PLAN / ED COURSE  Pertinent labs & imaging results that were available during my care of the patient were reviewed by me and considered in my medical decision making (see chart for details).   Patient presented to the emergency department today because of concerns for some chest pain and  shortness of breath that has occurred at night past couple of days.  Patient exam without any wheezing, crackles.  Patient without any respiratory distress.  Work-up with chest x-ray without concerning findings, EKG and blood work without concerning findings.  At this point I doubt ACS given negative troponin and a few day history of the symptoms.  Do wonder if could be possible acid reflux given that occurs at night when she lies flat.  I did discuss this possibility with the patient.  Did discuss importance of follow-up.   ____________________________________________   FINAL CLINICAL IMPRESSION(S) / ED DIAGNOSES  Final diagnoses:  Nonspecific chest pain  Shortness of breath     Note: This dictation was prepared with Dragon dictation. Any transcriptional errors that result from this process are unintentional  Nance Pear, MD 12/07/18 2231

## 2018-12-07 NOTE — Telephone Encounter (Signed)
Nothing further neeed

## 2018-12-07 NOTE — Discharge Instructions (Addendum)
Please seek medical attention for any high fevers, chest pain, shortness of breath, change in behavior, persistent vomiting, bloody stool or any other new or concerning symptoms.  

## 2018-12-07 NOTE — ED Triage Notes (Signed)
Chest heaviness began 4 days ago, worse at night.

## 2018-12-22 ENCOUNTER — Other Ambulatory Visit: Payer: Self-pay | Admitting: Internal Medicine

## 2018-12-22 DIAGNOSIS — Z1231 Encounter for screening mammogram for malignant neoplasm of breast: Secondary | ICD-10-CM

## 2018-12-23 ENCOUNTER — Telehealth: Payer: Self-pay | Admitting: Pulmonary Disease

## 2018-12-23 NOTE — Telephone Encounter (Signed)
Routed to [facility]

## 2018-12-23 NOTE — Telephone Encounter (Signed)
Attempted to return call to Sam with [employer] at number provided without success. The number provided in not in service. Contacted [employer] at different number and was informed that pt in not a patient of theirs. Will close encounter at this time.

## 2018-12-31 ENCOUNTER — Telehealth: Payer: Self-pay | Admitting: Pulmonary Disease

## 2018-12-31 NOTE — Telephone Encounter (Signed)
LMOVM for Teresa Krueger with [employer] to return call about this request. Catha Gosselin

## 2019-01-04 NOTE — Telephone Encounter (Signed)
Spoke with Melissa with [employer] today, CM sent to [facility] for her or someone with former [facility]  to pull records if needed to send to [employer]. Rhonda J Cobb

## 2019-01-04 NOTE — Telephone Encounter (Signed)
Levada Dy with [employer] is addressing this issue.  Nothing needed by our office. Rhonda J Cobb

## 2019-01-08 ENCOUNTER — Telehealth: Payer: Self-pay | Admitting: Pulmonary Disease

## 2019-01-11 NOTE — Telephone Encounter (Signed)
Called phone number listed.  Spoke with Delores with [employer] who didn't know who Appy is.  Advised Delores that this is the second request we received and I can not fax records to someone that 1) don't know why records are needed and 2) who is requesting them.  Delores from [employer] looked in pt's record and sees that this is the second request and advised me to close this request since we do not know who this person is and why they are being requested.  Phone note sent to Darlina Guys with [employer] for her information. Phone message closed and records were not sent. Rhonda J Cobb

## 2019-01-28 ENCOUNTER — Ambulatory Visit
Admission: RE | Admit: 2019-01-28 | Discharge: 2019-01-28 | Disposition: A | Discharge status: Home / Self Care | Payer: Medicare Other | Source: Ambulatory Visit | Attending: Internal Medicine | Admitting: Internal Medicine

## 2019-01-28 DIAGNOSIS — Z1231 Encounter for screening mammogram for malignant neoplasm of breast: Secondary | ICD-10-CM | POA: Insufficient documentation

## 2019-06-07 ENCOUNTER — Telehealth: Payer: Self-pay | Admitting: Internal Medicine

## 2019-06-07 NOTE — Telephone Encounter (Signed)
Pt has been scheduled for OV with Dr. Patsey Berthold on 06/22/2019 at 4:00. Pt is aware and voiced her understanding.  Pt also wanted to pt DK aware that her husband passed recently. He was a former pt of Dr. Zoila Shutter as well.

## 2019-06-22 ENCOUNTER — Ambulatory Visit: Payer: Medicare Other | Admitting: Pulmonary Disease

## 2019-07-19 ENCOUNTER — Emergency Department
Admission: EM | Admit: 2019-07-19 | Discharge: 2019-07-19 | Disposition: A | Discharge status: Home / Self Care | Payer: Medicare Other | Attending: Emergency Medicine | Admitting: Emergency Medicine

## 2019-07-19 ENCOUNTER — Encounter: Payer: Self-pay | Admitting: *Deleted

## 2019-07-19 ENCOUNTER — Emergency Department: Payer: Medicare Other

## 2019-07-19 ENCOUNTER — Other Ambulatory Visit: Payer: Self-pay

## 2019-07-19 DIAGNOSIS — J449 Chronic obstructive pulmonary disease, unspecified: Secondary | ICD-10-CM | POA: Diagnosis not present

## 2019-07-19 DIAGNOSIS — Z950 Presence of cardiac pacemaker: Secondary | ICD-10-CM | POA: Insufficient documentation

## 2019-07-19 DIAGNOSIS — E119 Type 2 diabetes mellitus without complications: Secondary | ICD-10-CM | POA: Insufficient documentation

## 2019-07-19 DIAGNOSIS — I1310 Hypertensive heart and chronic kidney disease without heart failure, with stage 1 through stage 4 chronic kidney disease, or unspecified chronic kidney disease: Secondary | ICD-10-CM | POA: Insufficient documentation

## 2019-07-19 DIAGNOSIS — R519 Headache, unspecified: Secondary | ICD-10-CM | POA: Insufficient documentation

## 2019-07-19 DIAGNOSIS — R531 Weakness: Secondary | ICD-10-CM | POA: Diagnosis not present

## 2019-07-19 DIAGNOSIS — J45909 Unspecified asthma, uncomplicated: Secondary | ICD-10-CM | POA: Insufficient documentation

## 2019-07-19 DIAGNOSIS — N183 Chronic kidney disease, stage 3 unspecified: Secondary | ICD-10-CM | POA: Diagnosis not present

## 2019-07-19 DIAGNOSIS — Z87891 Personal history of nicotine dependence: Secondary | ICD-10-CM | POA: Insufficient documentation

## 2019-07-19 LAB — COMPREHENSIVE METABOLIC PANEL
ALT: 14 U/L (ref 0–44)
AST: 25 U/L (ref 15–41)
Albumin: 3.9 g/dL (ref 3.5–5.0)
Alkaline Phosphatase: 71 U/L (ref 38–126)
Anion gap: 9 (ref 5–15)
BUN: 39 mg/dL — ABNORMAL HIGH (ref 8–23)
CO2: 27 mmol/L (ref 22–32)
Calcium: 9.3 mg/dL (ref 8.9–10.3)
Chloride: 104 mmol/L (ref 98–111)
Creatinine, Ser: 1.1 mg/dL — ABNORMAL HIGH (ref 0.44–1.00)
GFR calc Af Amer: 54 mL/min — ABNORMAL LOW (ref >60)
GFR calc non Af Amer: 46 mL/min — ABNORMAL LOW (ref >60)
Glucose, Bld: 104 mg/dL — ABNORMAL HIGH (ref 70–99)
Potassium: 4.2 mmol/L (ref 3.5–5.1)
Sodium: 140 mmol/L (ref 135–145)
Total Bilirubin: 0.8 mg/dL (ref 0.3–1.2)
Total Protein: 6.7 g/dL (ref 6.5–8.1)

## 2019-07-19 LAB — CBC WITH DIFFERENTIAL/PLATELET
Abs Immature Granulocytes: 0.01 10*3/uL (ref 0.00–0.07)
Basophils Absolute: 0 10*3/uL (ref 0.0–0.1)
Basophils Relative: 0 %
Eosinophils Absolute: 0.1 10*3/uL (ref 0.0–0.5)
Eosinophils Relative: 2 %
HCT: 39.7 % (ref 36.0–46.0)
Hemoglobin: 12.7 g/dL (ref 12.0–15.0)
Immature Granulocytes: 0 %
Lymphocytes Relative: 16 %
Lymphs Abs: 0.9 10*3/uL (ref 0.7–4.0)
MCH: 29.3 pg (ref 26.0–34.0)
MCHC: 32 g/dL (ref 30.0–36.0)
MCV: 91.7 fL (ref 80.0–100.0)
Monocytes Absolute: 0.6 10*3/uL (ref 0.1–1.0)
Monocytes Relative: 12 %
Neutro Abs: 3.7 10*3/uL (ref 1.7–7.7)
Neutrophils Relative %: 70 %
Platelets: 150 10*3/uL (ref 150–400)
RBC: 4.33 MIL/uL (ref 3.87–5.11)
RDW: 12.3 % (ref 11.5–15.5)
WBC: 5.4 10*3/uL (ref 4.0–10.5)
nRBC: 0 % (ref 0.0–0.2)

## 2019-07-19 LAB — URINALYSIS, COMPLETE (UACMP) WITH MICROSCOPIC
Bacteria, UA: NONE SEEN
Bacteria, UA: NONE SEEN
Bilirubin Urine: NEGATIVE
Bilirubin Urine: NEGATIVE
Glucose, UA: NEGATIVE mg/dL
Glucose, UA: NEGATIVE mg/dL
Hgb urine dipstick: NEGATIVE
Hgb urine dipstick: NEGATIVE
Ketones, ur: NEGATIVE mg/dL
Ketones, ur: 5 mg/dL — AB
Nitrite: NEGATIVE
Nitrite: NEGATIVE
Protein, ur: 30 mg/dL — AB
Protein, ur: 100 mg/dL — AB
Specific Gravity, Urine: 1.014 (ref 1.005–1.030)
Specific Gravity, Urine: 1.014 (ref 1.005–1.030)
Squamous Epithelial / HPF: NONE SEEN (ref 0–5)
pH: 6 (ref 5.0–8.0)
pH: 6 (ref 5.0–8.0)

## 2019-07-19 MED ORDER — LORAZEPAM 0.5 MG PO TABS
0.5000 mg | ORAL_TABLET | Freq: Once | ORAL | Status: AC
  Administered 2019-07-19: 0.5 mg via ORAL
  Filled 2019-07-19: qty 1

## 2019-07-19 MED ORDER — LORAZEPAM 2 MG/ML IJ SOLN: 0.5000 mg | Freq: Once | INTRAMUSCULAR | Status: DC

## 2019-07-19 MED ORDER — METOCLOPRAMIDE HCL 5 MG/ML IJ SOLN: 10.0000 mg | Freq: Once | INTRAMUSCULAR | Status: DC

## 2019-07-19 MED ORDER — METOCLOPRAMIDE HCL 10 MG PO TABS
10.0000 mg | ORAL_TABLET | Freq: Once | ORAL | Status: AC
  Administered 2019-07-19: 19:00:00 10 mg via ORAL
  Filled 2019-07-19: qty 1

## 2019-07-19 NOTE — ED Provider Notes (Signed)
[facility] Emergency Department Provider Note       Time seen: ----------------------------------------- 4:13 PM on 07/19/2019 -----------------------------------------   I have reviewed the triage vital signs and the nursing notes.  HISTORY  Chief Complaint No chief complaint on file.    HPI Teresa Krueger is a 86 y.o. female with a history of arthritis, asthma, breast cancer, bronchiectasis, COPD, diabetes, hyperlipidemia, hypertension, lymphedema, CVA who presents to the ED for headache and difficulty caring for self.  Patient states she lost her husband of 60+ years of marriage 2 months ago.  She cannot take care of herself.  She presents with a frontal headache and is tremulous, states she is anxious.  She states a friend has been helping to take care of her but she can no longer take care of herself living alone.  She denies any other complaints other than headache.  Family states her lymphedema is worse than normal.  Past Medical History:  Diagnosis Date  . Arthritis   . Asthma   . Barrett's esophagus   . Breast cancer ([place]) 1999   LT MASTECTOMY  . Bronchiectasis ([place])   . COPD (chronic obstructive pulmonary disease) ([place])   . Diabetes mellitus ([place])   . Hypercholesterolemia   . Hypertension   . Lymphedema   . Mini stroke ([place])   . Osteoporosis   . Pacemaker     Patient Active Problem List   Diagnosis Date Noted  . Syncope 03/09/2018  . CKD (chronic kidney disease) stage 3, GFR 30-59 ml/min 02/14/2017  . Carotid artery stenosis 10/29/2016  . TIA (transient ischemic attack) 10/14/2016  . Swelling of limb 04/10/2016  . Varicose veins of leg with pain, bilateral 04/10/2016  . Lymphedema 04/10/2016  . Osteoarthritis 03/19/2016  . Osteoporosis 03/19/2016  . Coronary artery calcification seen on CAT scan 02/08/2016  . Incidental lung nodule 02/08/2016  . Thoracic aortic atherosclerosis ([place]) 02/08/2016  . S/P placement of cardiac  pacemaker 03/30/2015  . Morbid obesity ([place]) 03/17/2015  . Hypersomnia with sleep apnea 03/17/2015  . Mobitz type 2 second degree AV block 03/16/2015  . Multiple pulmonary nodules 09/13/2014  . Lung field abnormal 09/13/2014  . Barrett's esophagus 12/21/2013  . History of adenomatous polyp of colon 12/21/2013  . Hx of adenomatous colonic polyps 12/21/2013  . Benign essential hypertension 11/30/2013  . Acromioclavicular joint arthritis 11/23/2013  . Glenohumeral arthritis 11/23/2013  . Chest pain 10/21/2013  . Adult body mass index 50.0-59.9 ([place]) 02/09/2013  . Morbid obesity with BMI of 50.0-59.9, adult ([place]) 02/09/2013  . Primary localized osteoarthrosis, lower leg 02/09/2013  . Bronchiectasis ([place]) 05/19/2012  . Chronic bronchitis 12/31/2011  . Cough 12/31/2011  . GERD (gastroesophageal reflux disease) 12/31/2011  . Diabetes mellitus ([place])   . Hypertension     Past Surgical History:  Procedure Laterality Date  . CARPAL TUNNEL RELEASE     right  . CATARACT EXTRACTION     bilateral  . GALLBLADDER SURGERY    . MASTECTOMY Left 1999   BREAST CA  . PACEMAKER INSERTION Right 03/21/2015   Procedure: INSERTION PACEMAKER;  Surgeon: Isaias Cowman, MD;  Location: [facility] ORS;  Service: Cardiovascular;  Laterality: Right;  . TOTAL HIP ARTHROPLASTY  2001   left  . TRIGGER FINGER RELEASE     bilateral    Allergies Aspirin, Celebrex [celecoxib], Meloxicam, Nsaids, Quetiapine, Rofecoxib, Sulfa antibiotics, Tolmetin, Bactrim [sulfamethoxazole-trimethoprim], Biguanide copolymer, Buprenorphine hcl, Ciprofloxacin, Codeine, Dextromethorphan hbr, Dextromethorphan polistirex er, Furosemide, Hydrocodone, Metformin, Morphine and related,  Penicillins, Pregabalin, Promethazine, Propoxyphene, Propoxyphene, Quinolones, Seroquel [quetiapine fumarate], Serotonin reuptake inhibitors (ssris), Tramadol, and Ultracet [tramadol-acetaminophen]  Social History Social History   Tobacco Use  . Smoking  status: Former Smoker    Packs/day: 1.00    Years: 30.00    Pack years: 30.00    Types: Cigarettes    Quit date: 12/27/1992    Years since quitting: 26.5  . Smokeless tobacco: Never Used  Substance Use Topics  . Alcohol use: No  . Drug use: No   Review of Systems Constitutional: Negative for fever. Cardiovascular: Negative for chest pain. Respiratory: Negative for shortness of breath. Gastrointestinal: Negative for abdominal pain, vomiting and diarrhea. Musculoskeletal: Negative for back pain.  Positive for leg swelling Skin: Negative for rash. Neurological: Positive for headache  All systems negative/normal/unremarkable except as stated in the HPI  ____________________________________________   PHYSICAL EXAM:  VITAL SIGNS: ED Triage Vitals  Enc Vitals Group     BP      Pulse      Resp      Temp      Temp src      SpO2      Weight      Height      Head Circumference      Peak Flow      Pain Score      Pain Loc      Pain Edu?      Excl. in [place]?    Constitutional: Alert and oriented.  Anxious, mild distress Eyes: Conjunctivae are normal. Normal extraocular movements. ENT      Head: Normocephalic and atraumatic.      Nose: No congestion/rhinnorhea.      Mouth/Throat: Mucous membranes are moist.      Neck: No stridor. Cardiovascular: Normal rate, regular rhythm. No murmurs, rubs, or gallops. Respiratory: Normal respiratory effort without tachypnea nor retractions. Breath sounds are clear and equal bilaterally. No wheezes/rales/rhonchi. Gastrointestinal: Soft and nontender. Normal bowel sounds Musculoskeletal: Nontender with normal range of motion in extremities. No lower extremity tenderness nor edema. Neurologic:  Normal speech and language. No gross focal neurologic deficits are appreciated.  Tremulous Skin:  Skin is warm, dry and intact. No rash noted. Psychiatric: Anxious mood and affect ____________________________________________  EKG: Interpreted by me.   Atrial fibrillation with a rate of 83 bpm, left bundle branch block, long QT  ____________________________________________  ED COURSE:  As part of my medical decision making, I reviewed the following data within the [place] History obtained from family if available, nursing notes, old chart and ekg, as well as notes from prior ED visits. Patient presented for headache and inability to care for self, we will assess with labs and imaging as indicated at this time.   Procedures  Teresa Krueger was evaluated in Emergency Department on 07/19/2019 for the symptoms described in the history of present illness. She was evaluated in the context of the global COVID-19 pandemic, which necessitated consideration that the patient might be at risk for infection with the SARS-CoV-2 virus that causes COVID-19. Institutional protocols and algorithms that pertain to the evaluation of patients at risk for COVID-19 are in a state of rapid change based on information released by regulatory bodies including the CDC and federal and state organizations. These policies and algorithms were followed during the patient's care in the ED.  ____________________________________________   LABS (pertinent positives/negatives)  Labs Reviewed  COMPREHENSIVE METABOLIC PANEL - Abnormal; Notable for the following components:  Result Value   Glucose, Bld 104 (*)    BUN 39 (*)    Creatinine, Ser 1.10 (*)    GFR calc non Af Amer 46 (*)    GFR calc Af Amer 54 (*)    All other components within normal limits  URINALYSIS, COMPLETE (UACMP) WITH MICROSCOPIC - Abnormal; Notable for the following components:   Color, Urine YELLOW (*)    APPearance HAZY (*)    Protein, ur 30 (*)    Leukocytes,Ua LARGE (*)    Non Squamous Epithelial PRESENT (*)    All other components within normal limits  URINALYSIS, COMPLETE (UACMP) WITH MICROSCOPIC - Abnormal; Notable for the following components:   Color, Urine YELLOW (*)     APPearance CLEAR (*)    Ketones, ur 5 (*)    Protein, ur 100 (*)    Leukocytes,Ua MODERATE (*)    All other components within normal limits  URINE CULTURE  CBC WITH DIFFERENTIAL/PLATELET    RADIOLOGY Images were viewed by me  CT head IMPRESSION:  No evidence of acute intracranial abnormality.  ____________________________________________   DIFFERENTIAL DIAGNOSIS   Anxiety, depression, adjustment disorder, acute grief reaction, tension headache, migraine, subarachnoid hemorrhage, subdural, lymphedema  FINAL ASSESSMENT AND PLAN  Headache, weakness   Plan: The patient had presented for headache and inability to care for self. Patient's labs are grossly unremarkable, there is borderline evidence for UTI and we have sent a urine culture. Patient's imaging has not revealed any acute process.  Patient is grieving from the loss of her husband of 74 years 2 months ago.  I have discussed with the family and tried to convince her to be placed into a skilled facility but she has declined.  I have attempted to augment her care at home with a home health aide.   Laurence Aly, MD    Note: This note was generated in part or whole with voice recognition software. Voice recognition is usually quite accurate but there are transcription errors that can and very often do occur. I apologize for any typographical errors that were not detected and corrected.     Earleen Newport, MD 07/19/19 [phone]

## 2019-07-19 NOTE — ED Notes (Signed)
md at bedside with pt.

## 2019-07-19 NOTE — ED Notes (Signed)
Pt brought in via ems from home with a headache, tremors and bil leg pain.  Husband died 2 months ago and lives alone.  No chest pain or sob.  md at bedside.  Pt moved from hallway to room 25.  Pt alert  Speech clear.

## 2019-07-19 NOTE — ED Notes (Signed)
Pt's son was called and notified of pts d/c.

## 2019-07-19 NOTE — ED Notes (Signed)
Pt alert   Sinus on monitor.  Waiting on iv team

## 2019-07-19 NOTE — ED Notes (Signed)
Pt unable to sign for d/c but verbalized understanding of the information covered within the paperwork. Pt had no further questions for this RN / MD at the time of discharge. This note serves as witness to the comments above.

## 2019-07-19 NOTE — ED Notes (Signed)
Pt was given a meal tray and beverage with EDP permission

## 2019-07-19 NOTE — ED Notes (Signed)
Labs sent

## 2019-07-19 NOTE — ED Triage Notes (Signed)
Pt brought in via ems from home with a headache, tremors and bil leg pain.  Pt in hallway bed.  Pt alert  md at bedside.

## 2019-07-19 NOTE — ED Notes (Addendum)
Unable to start iv x 3.  Pt tolerated well.  Iv team order in place md aware.

## 2019-07-20 LAB — URINE CULTURE

## 2019-08-16 ENCOUNTER — Other Ambulatory Visit: Payer: Self-pay | Admitting: Internal Medicine

## 2019-08-16 DIAGNOSIS — R1084 Generalized abdominal pain: Secondary | ICD-10-CM

## 2019-08-30 ENCOUNTER — Other Ambulatory Visit: Payer: Self-pay | Admitting: Internal Medicine

## 2019-08-30 DIAGNOSIS — R1084 Generalized abdominal pain: Secondary | ICD-10-CM

## 2019-09-03 ENCOUNTER — Ambulatory Visit: Payer: Medicare Other | Attending: Internal Medicine

## 2019-09-03 ENCOUNTER — Ambulatory Visit: Payer: Medicare Other

## 2019-09-03 DIAGNOSIS — Z23 Encounter for immunization: Secondary | ICD-10-CM

## 2019-09-03 NOTE — Progress Notes (Signed)
   Covid-19 Vaccination Clinic  Name:  Teresa Krueger    MRN: [id number] DOB: 1934/05/08  09/03/2019  [name] was observed post Covid-19 immunization for 30 minutes based on pre-vaccination screening without incident. She was provided with Vaccine Information Sheet and instruction to access the V-Safe system.   [name] was instructed to call 911 with any severe reactions post vaccine: Marland Kitchen Difficulty breathing  . Swelling of face and throat  . A fast heartbeat  . A bad rash all over body  . Dizziness and weakness   Immunizations Administered    Name Date Dose VIS Date Route   Pfizer COVID-19 Vaccine 09/03/2019 11:29 AM 0.3 mL 06/04/2019 Intramuscular   Manufacturer: [place]   Lot: [id number]   [place]: [id number]

## 2019-09-10 ENCOUNTER — Other Ambulatory Visit: Payer: Self-pay

## 2019-09-10 ENCOUNTER — Ambulatory Visit
Admission: RE | Admit: 2019-09-10 | Discharge: 2019-09-10 | Disposition: A | Discharge status: Home / Self Care | Payer: Medicare Other | Source: Ambulatory Visit | Attending: Internal Medicine | Admitting: Internal Medicine

## 2019-09-10 DIAGNOSIS — R1084 Generalized abdominal pain: Secondary | ICD-10-CM | POA: Diagnosis present

## 2019-09-29 ENCOUNTER — Ambulatory Visit: Payer: Medicare Other | Attending: Internal Medicine

## 2019-09-29 DIAGNOSIS — Z23 Encounter for immunization: Secondary | ICD-10-CM

## 2019-09-29 NOTE — Progress Notes (Signed)
   Covid-19 Vaccination Clinic  Name:  Teresa Krueger    MRN: [id number] DOB: Dec 18, 1933  09/29/2019  Teresa Krueger was observed post Covid-19 immunization for 30 minutes based on pre-vaccination screening without incident. She was provided with Vaccine Information Sheet and instruction to access the V-Safe system.   Teresa Krueger was instructed to call 911 with any severe reactions post vaccine: Marland Kitchen Difficulty breathing  . Swelling of face and throat  . A fast heartbeat  . A bad rash all over body  . Dizziness and weakness   Immunizations Administered    Name Date Dose VIS Date Route   Pfizer COVID-19 Vaccine 09/29/2019  1:33 PM 0.3 mL 06/04/2019 Intramuscular   Manufacturer: [place]   Lot: [id number]   [place]: [id number]

## 2019-12-29 ENCOUNTER — Other Ambulatory Visit: Payer: Self-pay | Admitting: Internal Medicine

## 2019-12-29 DIAGNOSIS — Z1231 Encounter for screening mammogram for malignant neoplasm of breast: Secondary | ICD-10-CM

## 2020-01-31 ENCOUNTER — Ambulatory Visit
Admission: RE | Admit: 2020-01-31 | Discharge: 2020-01-31 | Disposition: A | Discharge status: Home / Self Care | Payer: Medicare Other | Source: Ambulatory Visit | Attending: Internal Medicine | Admitting: Internal Medicine

## 2020-01-31 ENCOUNTER — Other Ambulatory Visit: Payer: Self-pay

## 2020-01-31 DIAGNOSIS — Z1231 Encounter for screening mammogram for malignant neoplasm of breast: Secondary | ICD-10-CM | POA: Diagnosis present

## 2020-02-07 ENCOUNTER — Ambulatory Visit: Payer: Medicare Other | Admitting: Podiatry

## 2020-02-09 ENCOUNTER — Encounter: Payer: Self-pay | Admitting: Podiatry

## 2020-02-09 ENCOUNTER — Other Ambulatory Visit: Payer: Self-pay

## 2020-02-09 ENCOUNTER — Ambulatory Visit (INDEPENDENT_AMBULATORY_CARE_PROVIDER_SITE_OTHER): Payer: Medicare Other | Admitting: Podiatry

## 2020-02-09 DIAGNOSIS — M79676 Pain in unspecified toe(s): Secondary | ICD-10-CM

## 2020-02-09 DIAGNOSIS — Q828 Other specified congenital malformations of skin: Secondary | ICD-10-CM

## 2020-02-09 DIAGNOSIS — B351 Tinea unguium: Secondary | ICD-10-CM | POA: Diagnosis not present

## 2020-02-10 NOTE — Progress Notes (Signed)
Subjective:  Patient ID: Teresa Krueger, female    DOB: [birth year],  MRN: [id number] HPI Chief Complaint  Patient presents with  . Nail Problem    nail and callous trim, [facility]   She c/o of right heel pain, burning, itching mostly at night.  She has not done anything for treatment  . Callouses  . Diabetes    86 y.o. female presents with the above complaint.   ROS: Denies fever chills nausea vomiting muscle aches pains calf pain back pain chest pain shortness of breath.  Relates back pain only when laying down and then she has back pain occasionally when she is sitting.  States that her right heel hurts her only when she is laying down. She has been treated multiple times for plantar fasciitis.   Past Medical History:  Diagnosis Date  . Arthritis   . Asthma   . Barrett's esophagus   . Breast cancer ([place]) 1999   LT MASTECTOMY  . Bronchiectasis ([place])   . COPD (chronic obstructive pulmonary disease) ([place])   . Diabetes mellitus ([place])   . Hypercholesterolemia   . Hypertension   . Lymphedema   . Mini stroke ([place])   . Osteoporosis   . Pacemaker    Past Surgical History:  Procedure Laterality Date  . CARPAL TUNNEL RELEASE     right  . CATARACT EXTRACTION     bilateral  . GALLBLADDER SURGERY    . MASTECTOMY Left 1999   BREAST CA  . PACEMAKER INSERTION Right 03/21/2015   Procedure: INSERTION PACEMAKER;  Surgeon: Isaias Cowman, MD;  Location: [facility] ORS;  Service: Cardiovascular;  Laterality: Right;  . TOTAL HIP ARTHROPLASTY  2001   left  . TRIGGER FINGER RELEASE     bilateral    Current Outpatient Medications:  .  mupirocin ointment (BACTROBAN) 2 %, , Disp: , Rfl:  .  triamcinolone cream (KENALOG) 0.1 %, Apply topically 2 (two) times daily., Disp: , Rfl:  .  acetaminophen (TYLENOL) 650 MG CR tablet, Take 650 mg by mouth every morning., Disp: , Rfl:  .  ALPRAZolam (XANAX) 0.5 MG tablet, Take 1 tablet (0.5 mg total) by mouth 3 (three) times daily as needed for anxiety.  (Patient taking differently: Take 0.5 mg by mouth 3 (three) times daily as needed for anxiety. Take 1 tablet (0.5MG ) by mouth twice daily - may take a third tablet daily if needed for anxiety), Disp: 60 tablet, Rfl: 0 .  AMBULATORY NON FORMULARY MEDICATION, Medication Name: incentive spirometry use 10-15 times daily. Dx:J47.9, Disp: 1 each, Rfl: 0 .  budesonide (PULMICORT) 0.5 MG/2ML nebulizer solution, SMARTSIG:1 Vial(s) Via Nebulizer Every Evening, Disp: , Rfl:  .  clindamycin (CLEOCIN) 300 MG capsule, TAKE 2 CAPSULES BY MOUTH 1 HOUR BEFORE APPOINTMENT, Disp: , Rfl:  .  clopidogrel (PLAVIX) 75 MG tablet, Take 1 tablet (75 mg total) by mouth daily., Disp: 30 tablet, Rfl: 0 .  insulin lispro protamine-lispro (HUMALOG 75/25 MIX) (75-25) 100 UNIT/ML SUSP injection, Inject 15 Units into the skin 2 (two) times daily. (Patient taking differently: Inject 15 Units into the skin 2 (two) times daily. Inject up to 15u under the skin twice daily), Disp: 10 mL, Rfl: 11 .  KLOR-CON M20 20 MEQ tablet, Take 20 mEq by mouth every other day. , Disp: , Rfl:  .  losartan (COZAAR) 25 MG tablet, Take 25 mg by mouth daily., Disp: , Rfl:  .  Multiple Vitamin (MULTIVITAMIN WITH MINERALS) TABS tablet, Take 1 tablet by mouth  daily., Disp: , Rfl:  .  simvastatin (ZOCOR) 20 MG tablet, Take 20 mg by mouth daily., Disp: , Rfl:  .  torsemide (DEMADEX) 20 MG tablet, Take 20 mg by mouth every other day. , Disp: , Rfl:   Allergies  Allergen Reactions  . Aspirin Swelling and Other (See Comments)    Pt states that she has tongue swelling.    . Celebrex [Celecoxib] Anaphylaxis  . Meloxicam Swelling  . Nsaids Anaphylaxis, Other (See Comments) and Nausea And Vomiting    unknown   . Quetiapine Anaphylaxis    intolerant  . Rofecoxib Anaphylaxis  . Sulfa Antibiotics Hives, Itching, Anaphylaxis, Nausea And Vomiting and Other (See Comments)  . Tolmetin Anaphylaxis  . Bactrim [Sulfamethoxazole-Trimethoprim] Hives and Itching  .  Biguanide Copolymer Other (See Comments)    Reaction:  Unknown   . Buprenorphine Hcl Other (See Comments)    Reaction:  Unknown   . Ciprofloxacin Other (See Comments)    Reaction:  Unknown   . Codeine Nausea And Vomiting and Other (See Comments)    Reaction:  Syncope   . Dextromethorphan Hbr Other (See Comments)    Reaction:  GI upset   . Dextromethorphan Polistirex Er Other (See Comments)    Reaction:  GI upset  GI upset  . Furosemide Other (See Comments)    Reaction:  Unknown   . Hydrocodone Other (See Comments)    Reaction:  Unknown   . Metformin Other (See Comments)    Reaction:  Unknown   . Morphine And Related Other (See Comments)    Reaction:  Unknown   . Penicillins Swelling and Other (See Comments)    Pt did not answer the additional questions for this medication because she does not remember.    . Pregabalin Other (See Comments)    Reaction:  Unknown   . Promethazine Other (See Comments)    Reaction:  Unknown   . Propoxyphene Other (See Comments)    Reaction:  Unknown   . Propoxyphene Other (See Comments)  . Quinolones Other (See Comments)    Reaction:  Unknown   . Seroquel [Quetiapine Fumarate] Other (See Comments)    Reaction:  Unknown   . Serotonin Reuptake Inhibitors (Ssris) Other (See Comments)    Reaction:  Unknown   . Tramadol Nausea Only  . Ultracet [Tramadol-Acetaminophen] Other (See Comments)    Reaction:  Unknown    Review of Systems Objective:  There were no vitals filed for this visit.  General: Well developed, nourished, in no acute distress, alert and oriented x3   Dermatological: Skin is warm, dry and supple bilateral. Nails x 10 are well maintained; remaining integument appears unremarkable at this time. There are no open sores, no preulcerative lesions, no rash or signs of infection present. Toenails are long thick yellow dystrophic-like mycotic painful palpation as well as debridement.  Vascular: Dorsalis Pedis artery and Posterior Tibial  artery pedal pulses are 2/4 bilateral with immedate capillary fill time. Pedal hair growth present. No varicosities and no lower extremity edema present bilateral.   Neruologic: Grossly intact via light touch bilateral. Vibratory intact via tuning fork bilateral. Protective threshold with Semmes Wienstein monofilament intact to all pedal sites bilateral. Patellar and Achilles deep tendon reflexes 2+ bilateral. No Babinski or clonus noted bilateral.   Musculoskeletal: No gross boney pedal deformities bilateral. No pain, crepitus, or limitation noted with foot and ankle range of motion bilateral. Muscular strength 5/5 in all groups tested bilateral. No reproducible pain on palpation  of the right heel.  Gait: Unassisted, Nonantalgic.    Radiographs:  None taken  Assessment & Plan:   Assessment: Pain in limb secondary to onychomycosis and reactive hyper keratomas. She also has pain associated with the her right heel which is most likely due to her back.  Plan: Toenails were debrided today 1 through 5 bilaterally debrided all reactive hyperkeratotic tissue as well. Recommend that she follow-up with orthopedic doctor or back doctor to evaluate for radiculopathy.  She states that she can only lay on her left side at night which then causes pain to throb in her right heel. This sounds more radicular than on the foot.     Talissa Apple T. [place], [place]

## 2020-03-03 ENCOUNTER — Ambulatory Visit (INDEPENDENT_AMBULATORY_CARE_PROVIDER_SITE_OTHER): Payer: Medicare Other | Admitting: Urology

## 2020-03-03 ENCOUNTER — Encounter: Payer: Self-pay | Admitting: Urology

## 2020-03-03 ENCOUNTER — Other Ambulatory Visit: Payer: Self-pay

## 2020-03-03 VITALS — BP 140/80 | HR 74 | Ht 65.0 in | Wt 260.0 lb

## 2020-03-03 DIAGNOSIS — R339 Retention of urine, unspecified: Secondary | ICD-10-CM

## 2020-03-03 DIAGNOSIS — R399 Unspecified symptoms and signs involving the genitourinary system: Secondary | ICD-10-CM

## 2020-03-05 ENCOUNTER — Encounter: Payer: Self-pay | Admitting: Urology

## 2020-03-05 NOTE — Progress Notes (Addendum)
03/03/2020 12:53 PM   Teresa Krueger [birth year] [id number]  Referring provider: Adin Hector, MD [facility] [place],  [place] [address]  Chief Complaint  Patient presents with  . Other    HPI: Teresa Krueger is an 86 y.o. female seen at the request of Dr. [name] for evaluation of lower urinary tract symptoms.   Previously evaluated here in 2017 for symptoms of intermittent urinary stream, urinary hesitancy, straining to urinate and nocturia x3-4.  Bladder scan PVR minimal on several occasions  Renal ultrasound 2017 showed no significant abnormalities  Voiding symptoms have been intermittent and recently developed urinary hesitancy, straining to urinate and intermittent urinary stream feels like her voided volume is good  Nocturia stable at 3-4  Denies flank, abdominal or pelvic pain  Denies gross hematuria  Recent urinalysis was unremarkable  Denies tissue bulge per vagina  CT abdomen pelvis 08/2019 showed a small left renal cyst and no significant GU abnormalities   PMH: Past Medical History:  Diagnosis Date  . Arthritis   . Asthma   . Barrett's esophagus   . Breast cancer ([place]) 1999   LT MASTECTOMY  . Bronchiectasis ([place])   . COPD (chronic obstructive pulmonary disease) ([place])   . Diabetes mellitus ([place])   . Hypercholesterolemia   . Hypertension   . Lymphedema   . Mini stroke ([place])   . Osteoporosis   . Pacemaker     Surgical History: Past Surgical History:  Procedure Laterality Date  . CARPAL TUNNEL RELEASE     right  . CATARACT EXTRACTION     bilateral  . GALLBLADDER SURGERY    . MASTECTOMY Left 1999   BREAST CA  . PACEMAKER INSERTION Right 03/21/2015   Procedure: INSERTION PACEMAKER;  Surgeon: Isaias Cowman, MD;  Location: [facility] ORS;  Service: Cardiovascular;  Laterality: Right;  . TOTAL HIP ARTHROPLASTY  2001   left  . TRIGGER FINGER RELEASE     bilateral    Home Medications:  Allergies as of  03/03/2020      Reactions   Aspirin Swelling, Other (See Comments)   Pt states that she has tongue swelling.     Celebrex [celecoxib] Anaphylaxis   Meloxicam Swelling   Nsaids Anaphylaxis, Other (See Comments), Nausea And Vomiting   unknown   Quetiapine Anaphylaxis   intolerant   Rofecoxib Anaphylaxis   Sulfa Antibiotics Hives, Itching, Anaphylaxis, Nausea And Vomiting, Other (See Comments)   Tolmetin Anaphylaxis   Bactrim [sulfamethoxazole-trimethoprim] Hives, Itching   Biguanide Copolymer Other (See Comments)   Reaction:  Unknown    Buprenorphine Hcl Other (See Comments)   Reaction:  Unknown    Ciprofloxacin Other (See Comments)   Reaction:  Unknown    Codeine Nausea And Vomiting, Other (See Comments)   Reaction:  Syncope    Dextromethorphan Hbr Other (See Comments)   Reaction:  GI upset    Dextromethorphan Polistirex Er Other (See Comments)   Reaction:  GI upset  GI upset   Furosemide Other (See Comments)   Reaction:  Unknown    Hydrocodone Other (See Comments)   Reaction:  Unknown    Metformin Other (See Comments)   Reaction:  Unknown    Morphine And Related Other (See Comments)   Reaction:  Unknown    Penicillins Swelling, Other (See Comments)   Pt did not answer the additional questions for this medication because she does not remember.     Pregabalin Other (See Comments)   Reaction:  Unknown    Promethazine Other (See Comments)   Reaction:  Unknown    Propoxyphene Other (See Comments)   Reaction:  Unknown    Propoxyphene Other (See Comments)   Quinolones Other (See Comments)   Reaction:  Unknown    Seroquel [quetiapine Fumarate] Other (See Comments)   Reaction:  Unknown    Serotonin Reuptake Inhibitors (ssris) Other (See Comments)   Reaction:  Unknown    Tramadol Nausea Only   Ultracet [tramadol-acetaminophen] Other (See Comments)   Reaction:  Unknown       Medication List       Accurate as of March 03, 2020 11:59 PM. If you have any questions, ask  your nurse or doctor.        acetaminophen 650 MG CR tablet Commonly known as: TYLENOL Take 650 mg by mouth every morning.   ALPRAZolam 0.5 MG tablet Commonly known as: XANAX Take 1 tablet (0.5 mg total) by mouth 3 (three) times daily as needed for anxiety. What changed: additional instructions   AMBULATORY NON FORMULARY MEDICATION Medication Name: incentive spirometry use 10-15 times daily. Dx:J47.9   budesonide 0.5 MG/2ML nebulizer solution Commonly known as: PULMICORT SMARTSIG:1 Vial(s) Via Nebulizer Every Evening   clindamycin 300 MG capsule Commonly known as: CLEOCIN TAKE 2 CAPSULES BY MOUTH 1 HOUR BEFORE APPOINTMENT   clopidogrel 75 MG tablet Commonly known as: PLAVIX Take 1 tablet (75 mg total) by mouth daily.   insulin lispro protamine-lispro (75-25) 100 UNIT/ML Susp injection Commonly known as: HUMALOG 75/25 MIX Inject 15 Units into the skin 2 (two) times daily. What changed: additional instructions   [place]-Con M20 20 MEQ tablet Generic drug: potassium chloride SA Take 20 mEq by mouth every other day.   losartan 25 MG tablet Commonly known as: COZAAR Take 25 mg by mouth daily.   multivitamin with minerals Tabs tablet Take 1 tablet by mouth daily.   mupirocin ointment 2 % Commonly known as: BACTROBAN   simvastatin 20 MG tablet Commonly known as: ZOCOR Take 20 mg by mouth daily.   torsemide 20 MG tablet Commonly known as: DEMADEX Take 20 mg by mouth every other day.   triamcinolone cream 0.1 % Commonly known as: KENALOG Apply topically 2 (two) times daily.       Allergies:  Allergies  Allergen Reactions  . Aspirin Swelling and Other (See Comments)    Pt states that she has tongue swelling.    . Celebrex [Celecoxib] Anaphylaxis  . Meloxicam Swelling  . Nsaids Anaphylaxis, Other (See Comments) and Nausea And Vomiting    unknown   . Quetiapine Anaphylaxis    intolerant  . Rofecoxib Anaphylaxis  . Sulfa Antibiotics Hives, Itching,  Anaphylaxis, Nausea And Vomiting and Other (See Comments)  . Tolmetin Anaphylaxis  . Bactrim [Sulfamethoxazole-Trimethoprim] Hives and Itching  . Biguanide Copolymer Other (See Comments)    Reaction:  Unknown   . Buprenorphine Hcl Other (See Comments)    Reaction:  Unknown   . Ciprofloxacin Other (See Comments)    Reaction:  Unknown   . Codeine Nausea And Vomiting and Other (See Comments)    Reaction:  Syncope   . Dextromethorphan Hbr Other (See Comments)    Reaction:  GI upset   . Dextromethorphan Polistirex Er Other (See Comments)    Reaction:  GI upset  GI upset  . Furosemide Other (See Comments)    Reaction:  Unknown   . Hydrocodone Other (See Comments)    Reaction:  Unknown   . Metformin  Other (See Comments)    Reaction:  Unknown   . Morphine And Related Other (See Comments)    Reaction:  Unknown   . Penicillins Swelling and Other (See Comments)    Pt did not answer the additional questions for this medication because she does not remember.    . Pregabalin Other (See Comments)    Reaction:  Unknown   . Promethazine Other (See Comments)    Reaction:  Unknown   . Propoxyphene Other (See Comments)    Reaction:  Unknown   . Propoxyphene Other (See Comments)  . Quinolones Other (See Comments)    Reaction:  Unknown   . Seroquel [Quetiapine Fumarate] Other (See Comments)    Reaction:  Unknown   . Serotonin Reuptake Inhibitors (Ssris) Other (See Comments)    Reaction:  Unknown   . Tramadol Nausea Only  . Ultracet [Tramadol-Acetaminophen] Other (See Comments)    Reaction:  Unknown     Family History: Family History  Problem Relation Age of Onset  . Heart disease Paternal Grandmother   . Diabetes Paternal Grandmother   . Heart disease Father   . Heart disease Mother   . Heart disease Maternal Grandmother   . Stroke Maternal Aunt   . Breast cancer Paternal Aunt 94  . Lung cancer Brother 55  . Kidney disease Cousin   . Bladder Cancer Neg Hx     Social History:   reports that she quit smoking about 27 years ago. Her smoking use included cigarettes. She has a 30.00 pack-year smoking history. She has never used smokeless tobacco. She reports that she does not drink alcohol and does not use drugs.   Physical Exam: BP 140/80   Pulse 74   Ht 5\' 5"  (1.651 m)   Wt 260 lb (117.9 kg)   BMI 43.27 kg/m   Constitutional:  Alert and oriented, No acute distress. HEENT: [place] AT, moist mucus membranes.  Trachea midline, no masses. Cardiovascular: No clubbing, cyanosis, or edema. Respiratory: Normal respiratory effort, no increased work of breathing. Skin: No rashes, bruises or suspicious lesions. Neurologic: Grossly intact, no focal deficits, moving all 4 extremities. Psychiatric: Normal mood and affect.   Assessment & Plan:    1.  Lower urinary tract symptoms  Obstructive voiding symptoms however PVR bladder scan  Symptoms have been chronic and intermittent, unlikely significant pathology  PVR by bladder scan was 0 mL  Schedule cystoscopy and will perform pelvic exam at time of cystoscopy to evaluate for cystocele   Abbie Sons, MD  [place] [address] [place], [place] [address] [phone]

## 2020-04-02 NOTE — Progress Notes (Signed)
   04/03/2020  CC:  Chief Complaint  Patient presents with  . Cysto    HPI: Teresa Krueger is a 86 y.o. female who returns for a cystoscopy.    Previously evaluated here in 2017 for symptoms of intermittent urinary stream, urinary hesitancy, straining to urinate and nocturia x3-4.  Bladder scan PVR minimal on several occasions  Renal ultrasound 2017 showed no significant abnormalities  Voiding symptoms have been intermittent and recently developed urinary hesitancy, straining to urinate and intermittent urinary stream feels like her voided volume is good  States her voiding symptoms significantly improved 3 to 4 days after her last visit  Denies flank, abdominal or pelvic pain  Denies gross hematuria  Recent urinalysis was unremarkable  Denies tissue bulge per vagina  CT abdomen pelvis 08/2019 showed a small left renal cyst and no significant GU abnormalities   Blood pressure (!) 156/69, pulse 80, height 4\' 11"  (1.499 m), weight 260 lb (117.9 kg). NED. A&Ox3.   No respiratory distress   Abd soft, NT, ND Atrophic external genitalia with patent urethral meatus.  No cystocele/prolapse  Cystoscopy Procedure Note  Patient identification was confirmed, informed consent was obtained, and patient was prepped using Betadine solution.  Lidocaine jelly was administered per urethral meatus.    Procedure: - Flexible cystoscope introduced, without any difficulty.   - Thorough search of the bladder revealed:    normal urethral meatus    normal urothelium    no stones    no ulcers     no tumors    no urethral polyps    no trabeculation  - Ureteral orifices were normal in position and appearance.  Post-Procedure: - Patient tolerated the procedure well  Assessment/ Plan:  1. Lower urinary tract symptoms  -Cystoscopy is negative. -Stable lower urinary symptoms. -Voiding well. -Follow up as needed.   Fransico Him, am acting as a scribe for Dr. Nicki Reaper C.  [name],  I have reviewed the above documentation for accuracy and completeness, and I agree with the above.   Abbie Sons, MD

## 2020-04-03 ENCOUNTER — Encounter: Payer: Self-pay | Admitting: Urology

## 2020-04-03 ENCOUNTER — Ambulatory Visit (INDEPENDENT_AMBULATORY_CARE_PROVIDER_SITE_OTHER): Payer: Medicare Other | Admitting: Urology

## 2020-04-03 ENCOUNTER — Other Ambulatory Visit: Payer: Self-pay

## 2020-04-03 VITALS — BP 156/69 | HR 80 | Ht 59.0 in | Wt 260.0 lb

## 2020-04-03 DIAGNOSIS — R399 Unspecified symptoms and signs involving the genitourinary system: Secondary | ICD-10-CM | POA: Diagnosis not present

## 2020-04-04 LAB — MICROSCOPIC EXAMINATION: Bacteria, UA: NONE SEEN

## 2020-04-04 LAB — URINALYSIS, COMPLETE
Bilirubin, UA: NEGATIVE
Glucose, UA: NEGATIVE
Ketones, UA: NEGATIVE
Nitrite, UA: NEGATIVE
RBC, UA: NEGATIVE
Specific Gravity, UA: 1.01 (ref 1.005–1.030)
Urobilinogen, Ur: 0.2 mg/dL (ref 0.2–1.0)
pH, UA: 6 (ref 5.0–7.5)

## 2020-04-24 ENCOUNTER — Other Ambulatory Visit
Admission: RE | Admit: 2020-04-24 | Discharge: 2020-04-24 | Disposition: A | Discharge status: Home / Self Care | Payer: Medicare Other | Source: Ambulatory Visit | Attending: Family Medicine | Admitting: Family Medicine

## 2020-04-24 DIAGNOSIS — R600 Localized edema: Secondary | ICD-10-CM | POA: Insufficient documentation

## 2020-04-24 DIAGNOSIS — R0602 Shortness of breath: Secondary | ICD-10-CM | POA: Diagnosis present

## 2020-04-24 LAB — BRAIN NATRIURETIC PEPTIDE: B Natriuretic Peptide: 384.9 pg/mL — ABNORMAL HIGH (ref 0.0–100.0)

## 2020-05-02 DIAGNOSIS — R600 Localized edema: Secondary | ICD-10-CM | POA: Insufficient documentation

## 2020-05-15 ENCOUNTER — Other Ambulatory Visit: Payer: Self-pay

## 2020-05-15 ENCOUNTER — Ambulatory Visit (INDEPENDENT_AMBULATORY_CARE_PROVIDER_SITE_OTHER): Payer: Medicare Other | Admitting: Podiatry

## 2020-05-15 ENCOUNTER — Encounter: Payer: Self-pay | Admitting: Podiatry

## 2020-05-15 DIAGNOSIS — Q828 Other specified congenital malformations of skin: Secondary | ICD-10-CM | POA: Diagnosis not present

## 2020-05-15 DIAGNOSIS — M79676 Pain in unspecified toe(s): Secondary | ICD-10-CM | POA: Diagnosis not present

## 2020-05-15 DIAGNOSIS — B351 Tinea unguium: Secondary | ICD-10-CM

## 2020-05-15 NOTE — Progress Notes (Signed)
She presents today for follow-up and chief complaint of painfully elongated toenails and calluses bilaterally.  Objective: Vital signs are stable alert oriented x3 she is bound to the wheelchair with the back laid back on her foot elevated as able to trim her nails.  Nails are thick yellow dystrophic-like mycotic she also has reactive callus to the medial aspect of her right heel.  But she does not have 1 to the left foot.  Assessment: Toenails were painful and thick.  Plan: Debridement toenails and calluses bilateral.

## 2020-05-29 ENCOUNTER — Other Ambulatory Visit: Payer: Self-pay | Admitting: Internal Medicine

## 2020-05-29 DIAGNOSIS — R600 Localized edema: Secondary | ICD-10-CM

## 2020-05-30 ENCOUNTER — Other Ambulatory Visit: Payer: Self-pay | Admitting: Internal Medicine

## 2020-05-30 ENCOUNTER — Other Ambulatory Visit: Payer: Self-pay

## 2020-05-30 ENCOUNTER — Ambulatory Visit
Admission: RE | Admit: 2020-05-30 | Discharge: 2020-05-30 | Disposition: A | Discharge status: Home / Self Care | Payer: Medicare Other | Source: Ambulatory Visit | Attending: Internal Medicine | Admitting: Internal Medicine

## 2020-05-30 ENCOUNTER — Ambulatory Visit: Payer: Medicare Other | Attending: Internal Medicine

## 2020-05-30 DIAGNOSIS — R600 Localized edema: Secondary | ICD-10-CM | POA: Diagnosis not present

## 2020-05-30 DIAGNOSIS — Z23 Encounter for immunization: Secondary | ICD-10-CM

## 2020-06-20 ENCOUNTER — Other Ambulatory Visit: Payer: Self-pay

## 2020-06-20 ENCOUNTER — Ambulatory Visit (INDEPENDENT_AMBULATORY_CARE_PROVIDER_SITE_OTHER): Payer: Medicare Other | Admitting: Vascular Surgery

## 2020-06-20 ENCOUNTER — Encounter (INDEPENDENT_AMBULATORY_CARE_PROVIDER_SITE_OTHER): Payer: Self-pay | Admitting: Vascular Surgery

## 2020-06-20 ENCOUNTER — Telehealth (INDEPENDENT_AMBULATORY_CARE_PROVIDER_SITE_OTHER): Payer: Self-pay

## 2020-06-20 ENCOUNTER — Encounter (INDEPENDENT_AMBULATORY_CARE_PROVIDER_SITE_OTHER): Payer: Self-pay

## 2020-06-20 VITALS — BP 141/74 | HR 69 | Resp 16

## 2020-06-20 DIAGNOSIS — I6523 Occlusion and stenosis of bilateral carotid arteries: Secondary | ICD-10-CM | POA: Diagnosis not present

## 2020-06-20 DIAGNOSIS — E1140 Type 2 diabetes mellitus with diabetic neuropathy, unspecified: Secondary | ICD-10-CM

## 2020-06-20 DIAGNOSIS — N1831 Chronic kidney disease, stage 3a: Secondary | ICD-10-CM

## 2020-06-20 DIAGNOSIS — I10 Essential (primary) hypertension: Secondary | ICD-10-CM | POA: Diagnosis not present

## 2020-06-20 DIAGNOSIS — I890 Lymphedema, not elsewhere classified: Secondary | ICD-10-CM

## 2020-06-20 DIAGNOSIS — M7989 Other specified soft tissue disorders: Secondary | ICD-10-CM

## 2020-06-20 NOTE — Assessment & Plan Note (Signed)
Marked swelling in both lower extremities with skin changes.  Has not used compression stockings in many months.  Leg elevation has been attempted but is limited.  She is not mobile.  She clearly has severe lymphedema.  With not being able to use her compression stockings at this point, and her swelling really out of control, I would recommend Unna boots be placed today.  She may or may not tolerate these because she cannot tolerate any pressure on her legs.  If she does not do Unna boots or some sort of compression, her leg swelling is not going to improve.  She has a litany of medical issues and her size and immobility are going to make swelling problematic.  She has had lymphedema in her arm for many years and she clearly has severe lymphedema in her legs now.  I would like to do a venous reflux study but she says she cannot tolerate this.  Although venous insufficiency is less likely than just lymphedema, it is certainly possible.  Maybe we can address this if we get her swelling under better control.  A 3 layer Unna boot was placed on both lower extremities today and will be changed weekly.  We will try to get home health as she is very immobile in getting her to the office going to be very difficult.  She will also benefit from a lymphedema pump and we will start that process as well.

## 2020-06-20 NOTE — Patient Instructions (Signed)

## 2020-06-20 NOTE — Assessment & Plan Note (Signed)
blood glucose control important in reducing the progression of atherosclerotic disease. Also, involved in wound healing. On appropriate medications.  

## 2020-06-20 NOTE — Assessment & Plan Note (Signed)
Does not appear to have been checked in several years.  Once we get her legs under better control, would consider carotid duplex in the office.

## 2020-06-20 NOTE — Assessment & Plan Note (Signed)
Can certainly contribute to lower extremity swelling. 

## 2020-06-20 NOTE — Assessment & Plan Note (Signed)
blood pressure control important in reducing the progression of atherosclerotic disease. On appropriate oral medications.  

## 2020-06-20 NOTE — Assessment & Plan Note (Signed)
The patient clearly has severe lymphedema of the lower extremities.  Venous insufficiency could be present but she is not agreeable to venous duplex to evaluate for this due to tenderness on her legs.  3 layer Unna boots were placed today to try to get her swelling under reasonable control.  If she does not have continuous compression and elevation, her swelling is unlikely to get much better.  A lymphedema pump would clearly be a good adjuvant therapy for what is now become stage II-III lymphedema.

## 2020-06-20 NOTE — Progress Notes (Signed)
Patient ID: Teresa Krueger, female   DOB: [birth year], 86 y.o.   MRN: [id number]  Chief Complaint  Patient presents with  . Follow-up     Ls 2018 for lymphedema    HPI Teresa Krueger is a 86 y.o. female.  I am asked to see the patient by Dr. Graciela Husbands for evaluation of worsening leg swelling and lymphedema.  I have previously seen her for carotid disease in the past but has been over 3 years ago.  She has become quite debilitated with her mobility being minimal at this point.  She gets around in a motorized wheelchair and her legs are dependent much of the day.  She has a history of heart disease and renal disease.  Her last GFR was roughly 50 and it has ranged between 43 and 53.  She reports a couple of months of severe swelling in both legs.  They are very tender to the touch.  She stopped wearing her compression stockings many months ago and the swelling has progressed after this.  No open wounds or infection although she has had some blistering of the skin of the lower leg bilaterally.  She has swelling all the way up into her thighs and lower abdomen.  She has a previous history of lymphedema in her left arm after breast cancer.  No recent focal neurologic symptoms.  She had known significant carotid disease several years ago when last checked.  She had a negative DVT study done on her legs in the last couple of weeks, and says that she cannot tolerate another ultrasound on her legs because they are so tender to the touch.   Past Medical History:  Diagnosis Date  . Arthritis   . Asthma   . Barrett's esophagus   . Breast cancer (HCC) 1999   LT MASTECTOMY  . Bronchiectasis (HCC)   . COPD (chronic obstructive pulmonary disease) (HCC)   . Diabetes mellitus (HCC)   . Hypercholesterolemia   . Hypertension   . Lymphedema   . Mini stroke (HCC)   . Osteoporosis   . Pacemaker     Past Surgical History:  Procedure Laterality Date  . CARPAL TUNNEL RELEASE     right  . CATARACT  EXTRACTION     bilateral  . GALLBLADDER SURGERY    . MASTECTOMY Left 1999   BREAST CA  . PACEMAKER INSERTION Right 03/21/2015   Procedure: INSERTION PACEMAKER;  Surgeon: Marcina Millard, MD;  Location: [facility] ORS;  Service: Cardiovascular;  Laterality: Right;  . TOTAL HIP ARTHROPLASTY  2001   left  . TRIGGER FINGER RELEASE     bilateral     Family History  Problem Relation Age of Onset  . Heart disease Paternal Grandmother   . Diabetes Paternal Grandmother   . Heart disease Father   . Heart disease Mother   . Heart disease Maternal Grandmother   . Stroke Maternal Aunt   . Breast cancer Paternal Aunt 12  . Lung cancer Brother 54  . Kidney disease Cousin   . Bladder Cancer Neg Hx      Social History   Tobacco Use  . Smoking status: Former Smoker    Packs/day: 1.00    Years: 30.00    Pack years: 30.00    Types: Cigarettes    Quit date: 12/27/1992    Years since quitting: 27.4  . Smokeless tobacco: Never Used  Substance Use Topics  . Alcohol use: No  . Drug use: No  Allergies  Allergen Reactions  . Aspirin Swelling and Other (See Comments)    Pt states that she has tongue swelling.    . Celebrex [Celecoxib] Anaphylaxis  . Meloxicam Swelling  . Nsaids Anaphylaxis, Other (See Comments) and Nausea And Vomiting    unknown   . Quetiapine Anaphylaxis    intolerant  . Rofecoxib Anaphylaxis  . Sulfa Antibiotics Hives, Itching, Anaphylaxis, Nausea And Vomiting and Other (See Comments)  . Tolmetin Anaphylaxis  . Bactrim [Sulfamethoxazole-Trimethoprim] Hives and Itching  . Biguanide Copolymer Other (See Comments)    Reaction:  Unknown   . Buprenorphine Hcl Other (See Comments)    Reaction:  Unknown   . Cetirizine Other (See Comments)  . Ciprofloxacin Other (See Comments)    Reaction:  Unknown   . Codeine Nausea And Vomiting and Other (See Comments)    Reaction:  Syncope   . Dextromethorphan Hbr Other (See Comments)    Reaction:  GI upset   . Dextromethorphan  Polistirex Er Other (See Comments)    Reaction:  GI upset  GI upset  . Furosemide Other (See Comments)    Reaction:  Unknown   . Hydrocodone Other (See Comments)    Reaction:  Unknown   . Metformin Other (See Comments)    Reaction:  Unknown   . Morphine And Related Other (See Comments)    Reaction:  Unknown   . Penicillins Swelling and Other (See Comments)    Pt did not answer the additional questions for this medication because she does not remember.    . Pregabalin Other (See Comments)    Reaction:  Unknown   . Promethazine Other (See Comments)    Reaction:  Unknown   . Propoxyphene Other (See Comments)    Reaction:  Unknown   . Propoxyphene Other (See Comments)  . Quinolones Other (See Comments)    Reaction:  Unknown   . Seroquel [Quetiapine Fumarate] Other (See Comments)    Reaction:  Unknown   . Serotonin Reuptake Inhibitors (Ssris) Other (See Comments)    Reaction:  Unknown   . Tramadol Nausea Only  . Ultracet [Tramadol-Acetaminophen] Other (See Comments)    Reaction:  Unknown     Current Outpatient Medications  Medication Sig Dispense Refill  . acetaminophen (TYLENOL) 650 MG CR tablet Take 650 mg by mouth every morning.    Marland Kitchen ALPRAZolam (XANAX) 0.5 MG tablet Take 1 tablet (0.5 mg total) by mouth 3 (three) times daily as needed for anxiety. (Patient taking differently: Take 0.5 mg by mouth 3 (three) times daily as needed for anxiety. Take 1 tablet (0.5MG ) by mouth twice daily - may take a third tablet daily if needed for anxiety) 60 tablet 0  . AMBULATORY NON FORMULARY MEDICATION Medication Name: incentive spirometry use 10-15 times daily. Dx:J47.9 1 each 0  . budesonide (PULMICORT) 0.5 MG/2ML nebulizer solution SMARTSIG:1 Vial(s) Via Nebulizer Every Evening    . calcium carbonate (TUMS - DOSED IN MG ELEMENTAL CALCIUM) 500 MG chewable tablet Chew 1 tablet by mouth as needed for indigestion or heartburn.    . clindamycin (CLEOCIN) 300 MG capsule TAKE 2 CAPSULES BY MOUTH 1  HOUR BEFORE APPOINTMENT    . clopidogrel (PLAVIX) 75 MG tablet Take 1 tablet (75 mg total) by mouth daily. 30 tablet 0  . insulin lispro protamine-lispro (HUMALOG 75/25 MIX) (75-25) 100 UNIT/ML SUSP injection Inject 15 Units into the skin 2 (two) times daily. (Patient taking differently: Inject 10 Units into the skin 2 (two) times daily. Inject  up to 15u under the skin twice daily) 10 mL 11  . KLOR-CON M20 20 MEQ tablet Take 20 mEq by mouth every other day.     . losartan (COZAAR) 25 MG tablet Take 25 mg by mouth daily.    . Magnesium Hydroxide (MILK OF MAGNESIA PO) Take by mouth as needed.    . Multiple Vitamin (MULTIVITAMIN WITH MINERALS) TABS tablet Take 1 tablet by mouth every other day.    . mupirocin ointment (BACTROBAN) 2 %     . simvastatin (ZOCOR) 20 MG tablet Take 20 mg by mouth daily.    Marland Kitchen torsemide (DEMADEX) 20 MG tablet Take 20 mg by mouth every other day.     . triamcinolone cream (KENALOG) 0.1 % Apply topically 2 (two) times daily.     No current facility-administered medications for this visit.      REVIEW OF SYSTEMS (Negative unless checked)  Constitutional: [] Weight loss  [] Fever  [] Chills Cardiac: [] Chest pain   [] Chest pressure   [] Palpitations   [] Shortness of breath when laying flat   [] Shortness of breath at rest   [x] Shortness of breath with exertion. Vascular:  [x] Pain in legs with walking   [x] Pain in legs at rest   [x] Pain in legs when laying flat   [] Claudication   [] Pain in feet when walking  [] Pain in feet at rest  [] Pain in feet when laying flat   [] History of DVT   [] Phlebitis   [x] Swelling in legs   [] Varicose veins   [] Non-healing ulcers Pulmonary:   [] Uses home oxygen   [] Productive cough   [] Hemoptysis   [] Wheeze  [] COPD   [] Asthma Neurologic:  [] Dizziness  [] Blackouts   [] Seizures   [] History of stroke   [] History of TIA  [] Aphasia   [] Temporary blindness   [] Dysphagia   [] Weakness or numbness in arms   [] Weakness or numbness in legs Musculoskeletal:   [x] Arthritis   [] Joint swelling   [] Joint pain   [] Low back pain Hematologic:  [] Easy bruising  [] Easy bleeding   [] Hypercoagulable state   [] Anemic  [] Hepatitis Gastrointestinal:  [] Blood in stool   [] Vomiting blood  [x] Gastroesophageal reflux/heartburn   [] Abdominal pain Genitourinary:  [] Chronic kidney disease   [] Difficult urination  [] Frequent urination  [] Burning with urination   [] Hematuria Skin:  [] Rashes   [] Ulcers   [] Wounds Psychological:  [] History of anxiety   []  History of major depression.    Physical Exam BP (!) 141/74 (BP Location: Right Arm)   Pulse 69   Resp 16  Gen:  WD/WN, NAD.  Head: [place]/AT, No temporalis wasting.  Ear/Nose/Throat: Hearing grossly intact, nares w/o erythema or drainage, oropharynx w/o Erythema/Exudate Eyes: Conjunctiva clear, sclera non-icteric  Neck: trachea midline.  No JVD.  Pulmonary:  Good air movement, respirations not labored, no use of accessory muscles  Cardiac: RRR, no JVD Vascular:  Vessel Right Left  Radial Palpable Palpable                          DP NP NP  PT NP NP   Gastrointestinal:. No masses, surgical incisions, or scars. Musculoskeletal: Diffusely weak.  In a motorized wheelchair.  Extremities without ischemic changes.  No deformity or atrophy. 3-4+ BLE edema.  The skin has peau d'orange appearance at points.  The swelling goes all the way up into the thighs bilaterally.  Mild to moderate stasis dermatitis changes worse on the left than the right Neurologic: Sensation grossly intact in extremities.  Symmetrical.  Speech is fluent. Motor exam as listed above. Psychiatric: Judgment intact, Mood & affect appropriate for pt's clinical situation. Dermatologic: No rashes or ulcers noted.  No cellulitis or open wounds.    Radiology US Venous Img Lower Bilateral (DVT)  Result Date: 05/30/2020 CLINICAL DATA:  Bilateral lower extremity edema and pain. EXAM: BILATERAL LOWER EXTREMITY VENOUS DOPPLER ULTRASOUND TECHNIQUE:  Gray-scale sonography with graded compression, as well as color Doppler and duplex ultrasound were performed to evaluate the lower extremity deep venous systems from the level of the common femoral vein and including the common femoral, femoral, profunda femoral, popliteal and calf veins including the posterior tibial, peroneal and gastrocnemius veins when visible. The superficial great saphenous vein was also interrogated. Spectral Doppler was utilized to evaluate flow at rest and with distal augmentation maneuvers in the common femoral, femoral and popliteal veins. COMPARISON:  Left lower extremity study on 10/09/2018 FINDINGS: RIGHT LOWER EXTREMITY Common Femoral Vein: No evidence of thrombus. Normal compressibility, respiratory phasicity and response to augmentation. Saphenofemoral Junction: No evidence of thrombus. Normal compressibility and flow on color Doppler imaging. Profunda Femoral Vein: No evidence of thrombus. Normal compressibility and flow on color Doppler imaging. Femoral Vein: No evidence of thrombus. Normal compressibility, respiratory phasicity and response to augmentation. Popliteal Vein: No evidence of thrombus. Normal compressibility, respiratory phasicity and response to augmentation. Calf Veins: No evidence of thrombus. Normal compressibility and flow on color Doppler imaging. Superficial Great Saphenous Vein: No evidence of thrombus. Normal compressibility. Venous Reflux:  None. Other Findings: No evidence of superficial thrombophlebitis or abnormal fluid collection. LEFT LOWER EXTREMITY Common Femoral Vein: No evidence of thrombus. Normal compressibility, respiratory phasicity and response to augmentation. Saphenofemoral Junction: No evidence of thrombus. Normal compressibility and flow on color Doppler imaging. Profunda Femoral Vein: No evidence of thrombus. Normal compressibility and flow on color Doppler imaging. Femoral Vein: No evidence of thrombus. Normal compressibility, respiratory  phasicity and response to augmentation. Popliteal Vein: No evidence of thrombus. Normal compressibility, respiratory phasicity and response to augmentation. Calf Veins: No evidence of thrombus. Normal compressibility and flow on color Doppler imaging. Superficial Great Saphenous Vein: No evidence of thrombus. Normal compressibility. Venous Reflux:  None. Other Findings: No evidence of superficial thrombophlebitis or abnormal fluid collection. IMPRESSION: No evidence of deep venous thrombosis in either lower extremity. Electronically Signed   By: Aletta Edouard M.D.   On: 05/30/2020 17:11    Labs Recent Results (from the past 2160 hour(s))  Urinalysis, Complete     Status: Abnormal   Collection Time: 04/03/20  1:14 PM  Result Value Ref Range   Specific Gravity, UA 1.010 1.005 - 1.030   pH, UA 6.0 5.0 - 7.5   Color, UA Yellow Yellow   Appearance Ur Cloudy (A) Clear   Leukocytes,UA Trace (A) Negative   Protein,UA 1+ (A) Negative/Trace   Glucose, UA Negative Negative   Ketones, UA Negative Negative   RBC, UA Negative Negative   Bilirubin, UA Negative Negative   Urobilinogen, Ur 0.2 0.2 - 1.0 mg/dL   Nitrite, UA Negative Negative   Microscopic Examination See below:   Microscopic Examination     Status: Abnormal   Collection Time: 04/03/20  1:14 PM   Urine  Result Value Ref Range   WBC, UA 6-10 (A) 0 - 5 /hpf   RBC 0-2 0 - 2 /hpf   Epithelial Cells (non renal) 0-10 0 - 10 /hpf   Renal Epithel, UA 0-10 (A) None seen /hpf   Casts Present (  A) None seen /lpf   Cast Type Hyaline casts N/A   Bacteria, UA None seen None seen/Few  Brain natriuretic peptide     Status: Abnormal   Collection Time: 04/24/20  2:48 PM  Result Value Ref Range   B Natriuretic Peptide 384.9 (H) 0.0 - 100.0 pg/mL    Comment: Performed at [facility], [address] [place], [place] [address]    Assessment/Plan:  Carotid artery stenosis Does not appear to have been checked in several years.  Once we  get her legs under better control, would consider carotid duplex in the office.  Benign essential hypertension blood pressure control important in reducing the progression of atherosclerotic disease. On appropriate oral medications.   Diabetes mellitus blood glucose control important in reducing the progression of atherosclerotic disease. Also, involved in wound healing. On appropriate medications.   CKD (chronic kidney disease) stage 3, GFR 0000000 ml/min Can certainly contribute to lower extremity swelling  Swelling of limb Marked swelling in both lower extremities with skin changes.  Has not used compression stockings in many months.  Leg elevation has been attempted but is limited.  She is not mobile.  She clearly has severe lymphedema.  With not being able to use her compression stockings at this point, and her swelling really out of control, I would recommend Unna boots be placed today.  She may or may not tolerate these because she cannot tolerate any pressure on her legs.  If she does not do Unna boots or some sort of compression, her leg swelling is not going to improve.  She has a litany of medical issues and her size and immobility are going to make swelling problematic.  She has had lymphedema in her arm for many years and she clearly has severe lymphedema in her legs now.  I would like to do a venous reflux study but she says she cannot tolerate this.  Although venous insufficiency is less likely than just lymphedema, it is certainly possible.  Maybe we can address this if we get her swelling under better control.  A 3 layer Unna boot was placed on both lower extremities today and will be changed weekly.  We will try to get home health as she is very immobile in getting her to the office going to be very difficult.  She will also benefit from a lymphedema pump and we will start that process as well.  Lymphedema The patient clearly has severe lymphedema of the lower extremities.  Venous  insufficiency could be present but she is not agreeable to venous duplex to evaluate for this due to tenderness on her legs.  3 layer Unna boots were placed today to try to get her swelling under reasonable control.  If she does not have continuous compression and elevation, her swelling is unlikely to get much better.  A lymphedema pump would clearly be a good adjuvant therapy for what is now become stage II-III lymphedema.      Leotis Pain 06/20/2020, 3:59 PM   This note was created with Dragon medical transcription system.  Any errors from dictation are unintentional.

## 2020-06-20 NOTE — Telephone Encounter (Signed)
Home health orders for bilateral unna wraps has been faxed to [facility]

## 2020-06-29 ENCOUNTER — Ambulatory Visit (INDEPENDENT_AMBULATORY_CARE_PROVIDER_SITE_OTHER): Payer: Medicare Other | Admitting: Dermatology

## 2020-06-29 ENCOUNTER — Other Ambulatory Visit: Payer: Self-pay

## 2020-06-29 DIAGNOSIS — L304 Erythema intertrigo: Secondary | ICD-10-CM | POA: Diagnosis not present

## 2020-06-29 DIAGNOSIS — L821 Other seborrheic keratosis: Secondary | ICD-10-CM

## 2020-06-29 DIAGNOSIS — L719 Rosacea, unspecified: Secondary | ICD-10-CM | POA: Diagnosis not present

## 2020-06-29 DIAGNOSIS — D485 Neoplasm of uncertain behavior of skin: Secondary | ICD-10-CM

## 2020-06-29 MED ORDER — KETOCONAZOLE 2 % EX CREA: 1.0000 "application " | TOPICAL_CREAM | Freq: Two times a day (BID) | CUTANEOUS | 2 refills | Status: AC

## 2020-06-29 MED ORDER — KETOCONAZOLE 2 % EX CREA: 1.0000 "application " | TOPICAL_CREAM | Freq: Two times a day (BID) | CUTANEOUS | 2 refills | Status: DC

## 2020-06-29 NOTE — Patient Instructions (Addendum)
Rosacea is a chronic progressive skin condition usually affecting the face of adults, causing redness and/or acne bumps. It is treatable but not curable. It sometimes affects the eyes (ocular rosacea) as well. It may respond to topical and/or systemic medication and can flare with stress, sun exposure, alcohol, exercise and some foods.  Daily application of broad spectrum spf 30+ sunscreen to face is recommended to reduce flares.  Gentle Skin Care Guide  1. Bathe no more than once a day.  2. Avoid bathing in hot water  3. Use a mild soap like Dove, Vanicream, Cetaphil, CeraVe. Can use Lever 2000 or Cetaphil antibacterial soap  4. Use soap only where you need it. On most days, use it under your arms, between your legs, and on your feet. Let the water rinse other areas unless visibly dirty.  5. When you get out of the bath/shower, use a towel to gently blot your skin dry, don't rub it.  6. While your skin is still a little damp, apply a moisturizing cream such as Vanicream, CeraVe, Cetaphil, Eucerin, Sarna lotion or plain Vaseline Jelly. For hands apply Neutrogena [place] Hand Cream or Excipial Hand Cream.  7. Reapply moisturizer any time you start to itch or feel dry.  8. Sometimes using free and clear laundry detergents can be helpful. Fabric softener sheets should be avoided. Downy Free & Gentle liquid, or any liquid fabric softener that is free of dyes and perfumes, it acceptable to use  9. If your doctor has given you prescription creams you may apply moisturizers over them   For scalp - When using Head & Shoulders recommend massaging into scalp and leaving on for about 10 minutes before rinsing out.   For Rosacea redness - Mix 1 bottle of Afrin original in 1 bottle of Cerave PM and mix well. Apply layer to face in the morning. This should temporarily take away the redness.  For rash at belly - Start ketoconazole cream (or if expensive, get OTC clotrimazole cream) twice a day until  clear  Wound Care Instructions  1. Cleanse wound gently with soap and water once a day then pat dry with clean gauze. Apply a thing coat of Petrolatum (petroleum jelly, "Vaseline") over the wound (unless you have an allergy to this). We recommend that you use a new, sterile tube of Vaseline. Do not pick or remove scabs. Do not remove the yellow or white "healing tissue" from the base of the wound.  2. Cover the wound with fresh, clean, nonstick gauze and secure with paper tape. You may use Band-Aids in place of gauze and tape if the would is small enough, but would recommend trimming much of the tape off as there is often too much. Sometimes Band-Aids can irritate the skin.  3. You should call the office for your biopsy report after 1 week if you have not already been contacted.  4. If you experience any problems, such as abnormal amounts of bleeding, swelling, significant bruising, significant pain, or evidence of infection, please call the office immediately.

## 2020-06-29 NOTE — Progress Notes (Signed)
   New Patient Visit  Subjective  Teresa Krueger is a 87 y.o. female who presents for the following: Rash   New patient here today for break out at face since end of summer. Also with an itchy spot at right scalp that when scratches she something "creamy" under her fingernails, present for 4-5 months. Patient also has spot at left upper thigh that is itchy. .  The following portions of the chart were reviewed this encounter and updated as appropriate:   Tobacco  Allergies  Meds  Problems  Med Hx  Surg Hx  Fam Hx      Review of Systems:  No other skin or systemic complaints except as noted in HPI or Assessment and Plan.  Objective  Well appearing patient in no apparent distress; mood and affect are within normal limits.  A focused examination was performed including scalp, face, thighs. Relevant physical exam findings are noted in the Assessment and Plan.  Objective  Face: Mid face erythema with many small inflammatory papules at the cheeks  Objective  Left Inguinal Area: Erythematous macerated patches  Objective  Left Lateral Thigh: 2.8 cm pink scaly plaque     Assessment & Plan  Rosacea Face  Chronic condition with expected duration over one year. No cure, only control. Currently flared.  Patient with no irritation or grittiness of the eyes.   Rosacea is a chronic progressive skin condition usually affecting the face of adults, causing redness and/or acne bumps. It is treatable but not curable. It sometimes affects the eyes (ocular rosacea) as well. It may respond to topical and/or systemic medication and can flare with stress, sun exposure, alcohol, exercise and some foods.  Daily application of broad spectrum spf 30+ sunscreen to face is recommended to reduce flares.  Discussed treating with topical metronidazole and also Rhofade and BBL for redness. She defers at this time.and metronidazole.    Erythema intertrigo Left Inguinal Area  Continue Zeasorb AF  powder. Patient has been using and it seems to help per patient.   Start ketoconazole cream (or if expensive, get OTC clotrimazole cream) twice a day until clear  Use Zeasorb AF powder daily for prevention.   Ordered Medications: ketoconazole (NIZORAL) 2 % cream  Neoplasm of uncertain behavior of skin Left Lateral Thigh  Skin / nail biopsy Type of biopsy: tangential   Informed consent: discussed and consent obtained   Timeout: patient name, date of birth, surgical site, and procedure verified   Patient was prepped and draped in usual sterile fashion: Area prepped with isopropyl alcohol. Anesthesia: the lesion was anesthetized in a standard fashion   Anesthetic:  1% lidocaine w/ epinephrine 1-100,000 buffered w/ 8.4% NaHCO3 Instrument used: flexible razor blade   Hemostasis achieved with: aluminum chloride   Outcome: patient tolerated procedure well   Post-procedure details: wound care instructions given   Additional details:  Mupirocin and a bandage applied  Specimen 1 - Surgical pathology Differential Diagnosis: r/o ISK > SCCis  Check Margins: No 2.8 cm pink scaly plaque  Seborrheic Keratoses - Stuck-on, waxy, tan-brown papules and plaques  - Discussed benign etiology and prognosis. - Observe - Call for any changes   Return if symptoms worsen or fail to improve.  Anise Salvo, RMA, am acting as scribe for Darden Dates, MD .  Documentation: I have reviewed the above documentation for accuracy and completeness, and I agree with the above.  Darden Dates, MD

## 2020-07-04 ENCOUNTER — Encounter: Payer: Self-pay | Admitting: Dermatology

## 2020-07-04 NOTE — Progress Notes (Signed)
Skin , left lateral thigh SPONGIOTIC DERMATITIS      "eczema" No evidence of skin cancer.  No treatment needed. Can prescribe triamcinolone 0.1% cream to use twice a day if desired. Avoid applying to face, groin, and axilla. Use as directed. Risk of skin atrophy with long-term use reviewed.    MAs please call. Thank you!

## 2020-07-05 ENCOUNTER — Telehealth: Payer: Self-pay

## 2020-07-05 ENCOUNTER — Telehealth (INDEPENDENT_AMBULATORY_CARE_PROVIDER_SITE_OTHER): Payer: Self-pay | Admitting: Vascular Surgery

## 2020-07-05 NOTE — Telephone Encounter (Signed)
Patient notified of biopsy results and other recommendations. She states she has triamcinolone cream and deferred refill of cream at this time. She verbalized understanding and denied further questions at this time.

## 2020-07-05 NOTE — Telephone Encounter (Signed)
Called stating that the PT services that was ordered [facility]) only have men and she would rather have a woman to help her with her daily needs. She stated that she has used [facility] before and was wondering if we could put in an order for them if possible.

## 2020-07-05 NOTE — Telephone Encounter (Signed)
-----   Message from Alfonso Patten, MD sent at 07/04/2020  5:23 PM EST ----- Skin , left lateral thigh SPONGIOTIC DERMATITIS      "eczema" No evidence of skin cancer.  No treatment needed. Can prescribe triamcinolone 0.1% cream to use twice a day if desired. Avoid applying to face, groin, and axilla. Use as directed. Risk of skin atrophy with long-term use reviewed.    MAs please call. Thank you!

## 2020-07-05 NOTE — Telephone Encounter (Signed)
Please advise the patient that due to staffing shortage it may not be feasible to change her homehealth. Please also advise that we only ordered unna boots to be placed and not daily needs help. Thank you

## 2020-07-05 NOTE — Telephone Encounter (Signed)
Could you please look into  the above.

## 2020-07-06 NOTE — Telephone Encounter (Signed)
Spoke with the patient regarding her message about lifting her legs due to the homehealth person being female. Per the patient she has a female person that does her unna boots and wanted to change the person that does her PT to a female. I explained that we have only set up homehealth for unna boot placement and not PT, so she may need to contact her PCP to request that be changed. Patient understood.

## 2020-07-06 NOTE — Telephone Encounter (Signed)
I called and  made the pt aware and she still would like to see if if she can be change to [facility] or another service provider due to when she has PT she has to lift her legs up in her gown and an she is uncomfortable doing so with a female.

## 2020-07-07 NOTE — Telephone Encounter (Signed)
Called the pt and made the pt aware that since she is not wearing unna boots she needs to wear compression sock preferably 20-30 grade.

## 2020-07-11 ENCOUNTER — Encounter (INDEPENDENT_AMBULATORY_CARE_PROVIDER_SITE_OTHER): Payer: Medicare Other | Admitting: Nurse Practitioner

## 2020-08-16 ENCOUNTER — Ambulatory Visit: Payer: Medicare Other | Admitting: Podiatry

## 2020-08-23 ENCOUNTER — Telehealth (INDEPENDENT_AMBULATORY_CARE_PROVIDER_SITE_OTHER): Payer: Self-pay | Admitting: Vascular Surgery

## 2020-08-23 NOTE — Telephone Encounter (Signed)
Called stating that she has not heard from the cancer center. Patient stated that on her last visit 06/13/20 with JD they talked about referring her to the cancer center. Patient has called multiple times to try and make an appointment and has not been successful. Please advise.

## 2020-09-11 NOTE — Patient Instructions (Signed)

## 2020-09-12 ENCOUNTER — Encounter: Payer: Self-pay | Admitting: Occupational Therapy

## 2020-09-12 ENCOUNTER — Ambulatory Visit: Payer: Medicare Other | Attending: Internal Medicine | Admitting: Occupational Therapy

## 2020-09-12 ENCOUNTER — Other Ambulatory Visit: Payer: Self-pay

## 2020-09-12 DIAGNOSIS — I890 Lymphedema, not elsewhere classified: Secondary | ICD-10-CM | POA: Diagnosis present

## 2020-09-13 NOTE — Therapy (Addendum)
[place] MAIN [facility] SERVICES [address] [place], [place], [address] Phone: [phone]   Fax:  [phone]  Occupational Krueger Evaluation  Patient Details  Name: Teresa Krueger MRN: [id number] Date of Birth: [birth year] Referring Provider (OT): Ramonita Lab III, MD   Encounter Date: 09/12/2020   OT End of Session - 09/22/20 1244    Visit Number 1    Number of Visits 36    Date for OT Re-Evaluation 12/11/20    OT Start Time 1300    OT Stop Time 1430    OT Time Calculation (min) 90 min    Activity Tolerance Patient tolerated treatment well;Patient limited by pain;Treatment limited secondary to medical complications (Comment);Other (comment)   Pt limited by severely impaired mobility, skin hypersensativity  and painful joints          Past Medical History:  Diagnosis Date  . Arthritis   . Asthma   . Barrett's esophagus   . Breast cancer ([place]) 1999   LT MASTECTOMY  . Bronchiectasis ([place])   . COPD (chronic obstructive pulmonary disease) ([place])   . Diabetes mellitus ([place])   . Hypercholesterolemia   . Hypertension   . Lymphedema   . Mini stroke ([place])   . Osteoporosis   . Pacemaker     Past Surgical History:  Procedure Laterality Date  . CARPAL TUNNEL RELEASE     right  . CATARACT EXTRACTION     bilateral  . GALLBLADDER SURGERY    . MASTECTOMY Left 1999   BREAST CA  . PACEMAKER INSERTION Right 03/21/2015   Procedure: INSERTION PACEMAKER;  Surgeon: Isaias Cowman, MD;  Location: [facility] ORS;  Service: Cardiovascular;  Laterality: Right;  . TOTAL HIP ARTHROPLASTY  2001   left  . TRIGGER FINGER RELEASE     bilateral    There were no vitals filed for this visit.      [facility] OT Assessment - 09/22/20 0001      Assessment   Medical Diagnosis BLE mild stage II lymphedema 2/2 to chronicvenous insufficiency, obesity and exacerbated by chronic systemic fluid overload    Referring Provider (OT) Ramonita Lab III, MD    Onset  Date/Surgical Date --   >20 years   Hand Dominance Right    Prior Krueger CDT with this therapist at [facility] for post-mastectomy lymphedema  2/2 breat cancer Rx      Precautions   Precautions Fall      Balance Screen   Has the patient fallen in the past 6 months No    Has the patient had a decrease in activity level because of a fear of falling?  Yes      Home  Environment   Family/patient expects to be discharged to: Private residence    Available Help at Discharge Personal care attendant    Type of [place] One level    [place] - 2 wheels;Bedside commode;Shower seat;Wheelchair - power;Wheelchair - manual    Lives With Alone      Prior Function   Level of Independence Needs Krueger with transfers;Needs Krueger with ADLs;Needs Krueger with gait;Needs Krueger with homemaking;Requires assistive device for independence    Vocation Retired    Leisure sedentary, essentially homebound      IADL   Prior Level of Function Shopping Max A    Shopping Completely unable to shop;Needs to be accompanied  on any shopping trip    Prior Level of Function Light Housekeeping Max A    Light Housekeeping Needs help with all home maintenance tasks    Prior Level of Function Meal Prep Max A    Meal Prep Needs to have meals prepared and served    Prior Level of Function Community Mobility Max A    Community Mobility Relies on family or friends for transportation      Mobility   Mobility Status History of falls;Needs assist    Mobility Status Comments power mobility in the community      Vision - History   Baseline Vision Wears glasses all the time      Activity Tolerance   Activity Tolerance Tolerates < 10 min activity with changes in vital signs    Activity Tolerance Comments SOB with transfer from PW to Rx bed      Cognition   Overall Cognitive Status Within Functional Limits for tasks  assessed      Observation/Other Assessments   Observations BLE presenting with + Stemmer signs at base of toes. Non-pitting, fatty edema below the knees with hemosiderin staining anteriorly. Fat pads at ankles. Multiple variscosities. Skin well hydrated and temp WNL. B thighs swollen symetrically. Difficult to assess due to cothing restrictions and inability to stand.    Skin Integrity signs/ symptoms of infection and skin breakdown absent    Focus on Therapeutic Outcomes (FOTO)  FOTO 12/100:  Patient's intake functional measure is 12 on a scale of 0 - 100 (higher number = greater function). Given the patient's risk-adjustment variables, like-patients nationally had a FS score of 40 at intake.    Outcome Measures BLR comparative limb volumetrics below the knees       TBA at initial RX visit      Posture/Postural Control   Posture/Postural Control Postural limitations    Postural Limitations Rounded Shoulders;Forward head;Increased thoracic kyphosis      Sensation   Light Touch Appears Intact      AROM   Overall AROM  Deficits;Due to pain      Strength   Overall Strength Deficits    Overall Strength Comments generalized deconditioning            LYMPHEDEMA/ONCOLOGY QUESTIONNAIRE - 09/22/20 1243      Right Lower Extremity Lymphedema   Other RLE limb volume from ankle to tibial tuberosity = 5357.26 ml.      Left Lower Extremity Lymphedema   Other LLE limb volume from ankle to tibial tuberosity = 5402.6 ml    Other Limb volume differential measures 0.8%, L>R(dominant).                  OT Treatments/Exercises (OP) - 09/22/20 1243      Manual Krueger   Manual Krueger Edema management;Compression Bandaging    Manual Krueger comments BLE comparative limb volumetrics below the knees    Compression Bandaging Multilayer gradient compression wraps from ankle to tibial tuberosity using artiflewx at the ankle and 2 short strech wraps over Rosidal foam.                       OT Long Term Goals - 09/22/20 1246      OT LONG TERM GOAL #1   Title Given this patient's risk-adjustment variables, and the actual Intake Functional Status score of 12/100 on the FOTO tool, patient Krueger experience at least an increase in function of 5 points, or higher.  Baseline dependent    Time 12    Period Weeks    Status New    Target Date 12/12/20      OT LONG TERM GOAL #2   Title Pt Krueger be able to apply knee length, multi-layer, short stretch compression wraps using correct gradient techniques to one leg below the knee to toes (A-D)  with MAX CAREGIVER Krueger  to return affected limb to premorbid size and shape, to limit leg pain and infection risk, and to improve safe functional ambulation and mobility.    Baseline dependent    Time 4    Period Days    Status New      OT LONG TERM GOAL #3   Title Patient Krueger demonstrate understanding of LE precautions, prevention strategies and cellulitis signs and symptoms by verbalizing at least 5 examples using a printed reference (modified independence) to limit infection risk, recurrent wounds and LE progression.    Baseline dependent    Time 4    Period Days    Status New      OT LONG TERM GOAL #4   Title Pt Krueger achieve no less than 10% limb volume reductions bilaterally below the knees (ankle to tibial tuberosity = A-D) during Intensive Phase CDT to achieve optimal limb volume reduction and to prevent re-accumulation of lymphatic congestion and fibrosis in the leg. limit infection risk, to improve functional ambulation and transfers, to improve functional performance of basic and instrumental ADLs, and to limit LE progression    Baseline dependent    Time 12    Period Weeks    Status New    Target Date 12/12/20      OT LONG TERM GOAL #5   Title Pt Krueger demonstrate and sustain a least 85% compliance performing all daily LE self-care home program components daily with MAX CAREGIVER Krueger  throughout Intensive Phase CDT, including recommended skin care regime, lymphatic pumping ther ex, 23/7 compression wraps and simple self-MLD, to ensure optimal limb volume reduction, to limit infection risk and to limit LE progression.    Baseline dependent    Time 12    Period Weeks    Status New    Target Date 12/12/20      OT LONG TERM GOAL #6   Title Using assistive devices (modified independence)and  MAX CG Krueger Pt Krueger be able to don and doff appropriate daytime compression garments and HOS devices to limit lymphatic re-accumulation and LE progression with before transitioning to Krueger phase of CDT.    Baseline dependent    Time 12    Period Weeks    Status New    Target Date 12/12/20                 Plan - 09/22/20 1245    Clinical Impression Statement Teresa Krueger is an 38 female presenting with mild-moderate, stage II, BLE lymphedema (LE) 2/2 chronic venous insufficiency and morbid obesity. Pt also presents with a complex constellation of other contributing health issues and environmental factors, including cardiac arrhythmia, asthma, chronic kidney disease, HTN, OA, OSA, generalized weakness and deconditioning and dependent positioning throughout the day.  Syetemic fluid overload also presents a significant strain on the lymphatics of the lower extremities. Non-pitting, dense edema is distributed from feet to groin bilaterally making legs difficult to lift against grafity for functional ambulation and transfers Leg swelling and associated pain  limits functional performance  in all occupational domains, including basic and instrumental ADLs, productive and  work activities, leisure pursuits and social participation. Appearance of swollen legs limits body image and affects clothing choices.  Teresa Krueger for Intensive and Management Phase Complete Decongestive Krueger (CDT) to include manual lymphatic drainage (M  LD), skin care, therapeutic exercise and compression Krueger (short stretch bandaging and custom garments), to reduce swelling, reduce infection risk and limit progression of LE. Emphasis throughout CDT Krueger focus on patient and caregiver education for LE Krueger over time. Without skilled OT for CDT LE Krueger continue to progress and Pt Krueger experience further functional decline. DUE to her inability to reach her feet and due to poor endurance, Teresa Krueger Krueger require consistent daily caregiver Krueger during Intensive CDT and for ongoing LE Krueger with all home program components, including multilayer, gradient compression wraps to one leg at a time only, skin care, therapeutic exercise and donning/ doffing compression garment alternatives and nighttime devices. DAILY CAREGIVER Krueger with all LE home program components and LE self-care is critical for progress towards goals, limiting infection risk and limiting LE progression. Without skilled OT for CDT, chronic, progressive lymphedema Krueger progress and further functional decline is expected.    OT Occupational Profile and History Comprehensive Assessment- Review of records and extensive additional review of physical, cognitive, psychosocial history related to current functional performance    Occupational performance deficits (Please refer to evaluation for details): ADL's;IADL's;Rest and Sleep;Work;Leisure;Social Participation;Other   body image   Rehab Potential Fair   with consistent daily CG Krueger   Clinical Decision Making Multiple treatment options, significant modification of task necessary    Comorbidities Affecting Occupational Performance: Presence of comorbidities impacting occupational performance    Modification or Krueger to Complete Evaluation  Max significant modification of tasks or assist is necessary to complete    OT Frequency 2x / week   and PRN with CG present   OT Duration 12 weeks    OT  Treatment/Interventions Self-care/ADL training;DME and/or AE instruction;Manual lymph drainage;Compression bandaging;Therapeutic activities;Therapeutic exercise;Coping strategies training;Energy conservation;Manual Krueger;Patient/family education    Plan CDT to one leg at a time below the knee only to limit falls risk. CDT to include MLD, skin care, compression and ther ex, as weell as caregiver training and Pt edu. DUE to [place] CAREGIVER Krueger  with all LE home program components  and LE self-care is critical for progress towards goals and ongoing LE management. WITHOUT DAILY CG Krueger THROUGHOUT INTENSIVE PHASE OF CDT WITH COMPRESSION WRAPS/ GARMENTS AND SKIN CARE prognosis for improvement in condition and for limiting for progression is poor, and of further functional decline is expected.    Recommended Other Services fit with adjustale , knee length, Velcro style leg wraps for daytime compression and HOS compression with Krueger    Consulted and Agree with Plan of Care Patient           Patient Krueger benefit from skilled therapeutic intervention in order to improve the following deficits and impairments:           Visit Diagnosis: Lymphedema, not elsewhere classified - Plan: Ot plan of care cert/re-cert    Problem List Patient Active Problem List   Diagnosis Date Noted  . Pedal edema 05/02/2020  . Lightheadedness 03/20/2018  . Syncope 03/09/2018  . CKD (chronic kidney disease) stage 3, GFR 30-59 ml/min (HCC) 02/14/2017  . Carotid artery stenosis 10/29/2016  . TIA (transient ischemic attack) 10/14/2016  . Chronic respiratory failure with hypoxia ([place]) 05/24/2016  .  Aortic stenosis, mild 05/02/2016  . SOB (shortness of breath) on exertion 05/02/2016  . Swelling of limb 04/10/2016  . Varicose veins of leg with pain, bilateral 04/10/2016  . Lymphedema 04/10/2016  . Osteoarthritis 03/19/2016  . Osteoporosis 03/19/2016  . Coronary  artery calcification seen on CAT scan 02/08/2016  . Incidental lung nodule 02/08/2016  . Thoracic aortic atherosclerosis ([place]) 02/08/2016  . S/P placement of cardiac pacemaker 03/30/2015  . Morbid obesity ([place]) 03/17/2015  . Hypersomnia with sleep apnea 03/17/2015  . Mobitz type 2 second degree AV block 03/16/2015  . Multiple pulmonary nodules 09/13/2014  . Lung field abnormal 09/13/2014  . Barrett's esophagus 12/21/2013  . History of adenomatous polyp of colon 12/21/2013  . Hx of adenomatous colonic polyps 12/21/2013  . Benign essential hypertension 11/30/2013  . Acromioclavicular joint arthritis 11/23/2013  . Glenohumeral arthritis 11/23/2013  . Chest pain 10/21/2013  . Adult body mass index 50.0-59.9 ([place]) 02/09/2013  . Morbid obesity with BMI of 50.0-59.9, adult ([place]) 02/09/2013  . Primary localized osteoarthrosis, lower leg 02/09/2013  . Bronchiectasis ([place]) 05/19/2012  . Chronic bronchitis 12/31/2011  . Cough 12/31/2011  . GERD (gastroesophageal reflux disease) 12/31/2011  . Diabetes mellitus ([place])   . Hypertension     Andrey Spearman, MS, OTR/L, [facility] 09/22/20 12:48 PM  [place] MAIN [facility] SERVICES [address] [place], [place], [address] Phone: [phone]   Fax:  [phone]  Name: ZEPHYRA BERNARDI MRN: [id number] Date of Birth: 1934-01-29

## 2020-09-15 ENCOUNTER — Telehealth (INDEPENDENT_AMBULATORY_CARE_PROVIDER_SITE_OTHER): Payer: Self-pay

## 2020-09-15 NOTE — Telephone Encounter (Signed)
Patient left a voicemail stating that she went to the lymphedema clinic and suggested that pump might not be the correct therapy for the patient,but she recommended  that the patient legs should wrapped instead. The lymphedema therapist is afraid that the pump will affect the heart and lungs. The patient spoke with her cardiologist and recommended for the patient to speak with our office. I spoke with Dr Lucky Cowboy and recommended that the pump will prevent the leg swelling and it would be the best, but if the lymphedema clinic would like to start wraps that will be fine. The pump can sporadic affect the heart and lungs,but that rarely do happen. Patient was made aware with medical advice.

## 2020-09-19 ENCOUNTER — Ambulatory Visit: Payer: Medicare Other | Admitting: Occupational Therapy

## 2020-09-21 ENCOUNTER — Ambulatory Visit: Payer: Medicare Other | Admitting: Occupational Therapy

## 2020-09-22 ENCOUNTER — Ambulatory Visit: Payer: Medicare Other | Attending: Internal Medicine | Admitting: Occupational Therapy

## 2020-09-22 ENCOUNTER — Other Ambulatory Visit: Payer: Self-pay

## 2020-09-22 DIAGNOSIS — I890 Lymphedema, not elsewhere classified: Secondary | ICD-10-CM | POA: Insufficient documentation

## 2020-09-22 NOTE — Therapy (Signed)
[place] MAIN [facility] SERVICES [address] [place], [place], [address] Phone: [phone]   Fax:  [phone]  Occupational Therapy Treatment  Patient Details  Name: Teresa Krueger MRN: [id number] Date of Birth: 05-Jan-1934 Referring Provider (OT): Teresa Lab III, MD   Encounter Date: 09/22/2020   OT End of Session - 09/22/20 1114    Visit Number 2    Number of Visits 36    Date for OT Re-Evaluation 12/11/20    OT Start Time 1110    OT Stop Time 1220    OT Time Calculation (min) 70 min    Activity Tolerance Patient tolerated treatment well;Patient limited by pain;Treatment limited secondary to medical complications (Comment)    Behavior During Therapy [facility] for tasks assessed/performed           Past Medical History:  Diagnosis Date  . Arthritis   . Asthma   . Barrett's esophagus   . Breast cancer ([place]) 1999   LT MASTECTOMY  . Bronchiectasis ([place])   . COPD (chronic obstructive pulmonary disease) ([place])   . Diabetes mellitus ([place])   . Hypercholesterolemia   . Hypertension   . Lymphedema   . Mini stroke ([place])   . Osteoporosis   . Pacemaker     Past Surgical History:  Procedure Laterality Date  . CARPAL TUNNEL RELEASE     right  . CATARACT EXTRACTION     bilateral  . GALLBLADDER SURGERY    . MASTECTOMY Left 1999   BREAST CA  . PACEMAKER INSERTION Right 03/21/2015   Procedure: INSERTION PACEMAKER;  Surgeon: Isaias Cowman, MD;  Location: [facility] ORS;  Service: Cardiovascular;  Laterality: Right;  . TOTAL HIP ARTHROPLASTY  2001   left  . TRIGGER FINGER RELEASE     bilateral    There were no vitals filed for this visit.   Subjective Assessment - 09/22/20 1119    Subjective  Teresa Krueger PRESENTS FOR ot VISIT [birth year] to address BLE lymphedema. Pt is accompanied by her paid caregiver and her daghter-in-law, Teresa Krueger and Teresa Krueger. Pt reports she is now taking 2 diuretics and her weight has decreased from 277 to 261. Pt states  legs do not hurth this morning, but knee pain is 10/10.    Limitations difficulty walking, impaired functional mobility/ transfers, chronic leg pain and swelling, SOB on exertion,    Repetition Increases Symptoms    Currently in Pain? Yes               LYMPHEDEMA/ONCOLOGY QUESTIONNAIRE - 09/22/20 1219      Right Lower Extremity Lymphedema   Other RLE limb volume from ankle to tibial tuberosity = 5357.26 ml.      Left Lower Extremity Lymphedema   Other LLE limb volume from ankle to tibial tuberosity = 5402.6 ml    Other Limb volume differential measures 0.8%, L>R(dominant).                   OT Treatments/Exercises (OP) - 09/22/20 0001      Manual Therapy   Manual Therapy Edema management;Compression Bandaging    Manual therapy comments BLE comparative limb volumetrics below the knees    Compression Bandaging Multilayer gradient compression wraps from ankle to tibial tuberosity using artiflewx at the ankle and 2 short strech wraps over Rosidal foam.                  OT Education - 09/22/20 1122    Education Details Emphasis of  LE self-care training today Pt and family edu for gradient compression wrap application at home between sessions.    Person(s) Educated Patient    Methods Explanation;Demonstration;Handout    Comprehension Verbalized understanding;Returned demonstration;Need further instruction               OT Long Term Goals - 09/13/20 1657      OT LONG TERM GOAL #1   Title Given this patient's risk-adjustment variables, and the actual Intake Functional Status score of 12/100 on the FOTO tool, patient will experience at least an increase in function of 5 points, or higher.    Baseline dependent    Time 12    Period Weeks    Status New    Target Date 12/12/20      OT LONG TERM GOAL #2   Title Pt will be able to apply knee length, multi-layer, short stretch compression wraps using correct gradient techniques to one leg below the knee to  toes (A-D)  with MAX CAREGIVER ASSISTANCE  to return affected limb to premorbid size and shape, to limit leg pain and infection risk, and to improve safe functional ambulation and mobility.    Baseline dependent    Time 4    Period Days    Status New    Target Date --   4th OT Rx visit w/ CG present     OT LONG TERM GOAL #3   Title Patient will demonstrate understanding of LE precautions, prevention strategies and cellulitis signs and symptoms by verbalizing at least 5 examples using a printed reference (modified independence) to limit infection risk, recurrent wounds and LE progression.    Baseline dependent    Time 4    Period Days    Status New    Target Date --   4th OT Rx visit     OT LONG TERM GOAL #4   Title Pt will achieve no less than 10% limb volume reductions bilaterally below the knees (ankle to tibial tuberosity = A-D) during Intensive Phase CDT to achieve optimal limb volume reduction and to prevent re-accumulation of lymphatic congestion and fibrosis in the leg. limit infection risk, to improve functional ambulation and transfers, to improve functional performance of basic and instrumental ADLs, and to limit LE progression    Baseline dependent    Time 12    Period Weeks    Status New    Target Date 12/12/20      OT LONG TERM GOAL #5   Title Pt will demonstrate and sustain a least 85% compliance performing all daily LE self-care home program components daily with MAX CAREGIVER ASSISTANCE throughout Intensive Phase CDT, including recommended skin care regime, lymphatic pumping ther ex, 23/7 compression wraps and simple self-MLD, to ensure optimal limb volume reduction, to limit infection risk and to limit LE progression.    Baseline dependent    Time 12    Period Weeks    Status New    Target Date 12/12/20      Long Term Additional Goals   Additional Long Term Goals Yes      OT LONG TERM GOAL #6   Title Using assistive devices (modified independence)and  MAX CG  assistance Pt will be able to don and doff appropriate daytime compression garments and HOS devices to limit lymphatic re-accumulation and LE progression with before transitioning to self-management phase of CDT.    Baseline dependent    Time 12    Period Weeks    Status New  Target Date 12/12/20                 Plan - 09/22/20 1223    Clinical Impression Statement By visual assessment and palpation limb volume below the knees is significantly decreased since last visit, most likely 2/2 increase in  diuretic dose. Pt c/o some lightheadedness, so opting not to wrap foot today due to falls risk. BLE comparative limb volumetrics reveal nearly symetrical distribution of fluid swelling below the knees today. Limb volume differential (LVD) measures 0.8%, L>R(dominant). Pt tolerated application of 3 layer, short stretch bandages in gradient configuration from ankle to tibial tuberosity without increased pain in clinic. Majority of time throughout session  devted to Pt and caregfiver education for outcome of baseline volumetrics and bandaging techniques. No wraps to foot to limit falls risk as Pt reports she has no caregiver assistance over the weekend. Cont as per POC.    OT Occupational Profile and History Comprehensive Assessment- Review of records and extensive additional review of physical, cognitive, psychosocial history related to current functional performance    Occupational performance deficits (Please refer to evaluation for details): ADL's;IADL's;Rest and Sleep;Work;Leisure;Social Participation;Other   body image   Rehab Potential Fair   with consistent daily CG assistance   Clinical Decision Making Multiple treatment options, significant modification of task necessary    Comorbidities Affecting Occupational Performance: Presence of comorbidities impacting occupational performance    Modification or Assistance to Complete Evaluation  Max significant modification of tasks or assist is  necessary to complete    OT Frequency 2x / week   and PRN with CG present   OT Duration 12 weeks    OT Treatment/Interventions Self-care/ADL training;DME and/or AE instruction;Manual lymph drainage;Compression bandaging;Therapeutic activities;Therapeutic exercise;Coping strategies training;Energy conservation;Manual Therapy;Patient/family education    Plan CDT to one leg at a time below the knee only to limit falls risk. CDT to include MLD, skin care, compression and ther ex, as weell as caregiver training and Pt edu. DUE to [place] CAREGIVER ASSISTANCE  with all LE home program components  and LE self-care is critical for progress towards goals and ongoing LE management. WITHOUT DAILY CG ASSISTANCE THROUGHOUT INTENSIVE PHASE OF CDT WITH COMPRESSION WRAPS/ GARMENTS AND SKIN CARE prognosis for improvement in condition and for limiting for progression is poor, and of further functional decline is expected.    Recommended Other Services fit with adjustale , knee length, Velcro style leg wraps for daytime compression and HOS compression with assistance    Consulted and Agree with Plan of Care Patient           Patient will benefit from skilled therapeutic intervention in order to improve the following deficits and impairments:           Visit Diagnosis: Lymphedema, not elsewhere classified    Problem List Patient Active Problem List   Diagnosis Date Noted  . Pedal edema 05/02/2020  . Lightheadedness 03/20/2018  . Syncope 03/09/2018  . CKD (chronic kidney disease) stage 3, GFR 30-59 ml/min (HCC) 02/14/2017  . Carotid artery stenosis 10/29/2016  . TIA (transient ischemic attack) 10/14/2016  . Chronic respiratory failure with hypoxia ([place]) 05/24/2016  . Aortic stenosis, mild 05/02/2016  . SOB (shortness of breath) on exertion 05/02/2016  . Swelling of limb 04/10/2016  . Varicose veins of leg with pain, bilateral 04/10/2016  . Lymphedema 04/10/2016   . Osteoarthritis 03/19/2016  . Osteoporosis 03/19/2016  . Coronary artery calcification seen on CAT scan  02/08/2016  . Incidental lung nodule 02/08/2016  . Thoracic aortic atherosclerosis ([place]) 02/08/2016  . S/P placement of cardiac pacemaker 03/30/2015  . Morbid obesity ([place]) 03/17/2015  . Hypersomnia with sleep apnea 03/17/2015  . Mobitz type 2 second degree AV block 03/16/2015  . Multiple pulmonary nodules 09/13/2014  . Lung field abnormal 09/13/2014  . Barrett's esophagus 12/21/2013  . History of adenomatous polyp of colon 12/21/2013  . Hx of adenomatous colonic polyps 12/21/2013  . Benign essential hypertension 11/30/2013  . Acromioclavicular joint arthritis 11/23/2013  . Glenohumeral arthritis 11/23/2013  . Chest pain 10/21/2013  . Adult body mass index 50.0-59.9 ([place]) 02/09/2013  . Morbid obesity with BMI of 50.0-59.9, adult ([place]) 02/09/2013  . Primary localized osteoarthrosis, lower leg 02/09/2013  . Bronchiectasis ([place]) 05/19/2012  . Chronic bronchitis 12/31/2011  . Cough 12/31/2011  . GERD (gastroesophageal reflux disease) 12/31/2011  . Diabetes mellitus ([place])   . Hypertension     Andrey Spearman, MS, OTR/L, [facility] 09/22/20 12:30 PM   [place] MAIN [facility] SERVICES [address] [place], [place], [address] Phone: [phone]   Fax:  [phone]  Name: Teresa Krueger MRN: [id number] Date of Birth: 04-17-1934

## 2020-09-26 ENCOUNTER — Ambulatory Visit: Payer: Medicare Other | Admitting: Occupational Therapy

## 2020-09-28 ENCOUNTER — Encounter: Payer: Medicare Other | Admitting: Occupational Therapy

## 2020-10-03 ENCOUNTER — Encounter: Payer: Medicare Other | Admitting: Occupational Therapy

## 2020-10-05 ENCOUNTER — Encounter: Payer: Medicare Other | Admitting: Occupational Therapy

## 2020-10-10 ENCOUNTER — Encounter: Payer: Medicare Other | Admitting: Occupational Therapy

## 2020-10-12 ENCOUNTER — Encounter: Payer: Medicare Other | Admitting: Occupational Therapy

## 2020-10-17 ENCOUNTER — Encounter: Payer: Medicare Other | Admitting: Occupational Therapy

## 2020-10-19 ENCOUNTER — Encounter: Payer: Medicare Other | Admitting: Occupational Therapy

## 2020-10-24 ENCOUNTER — Encounter: Payer: Medicare Other | Admitting: Occupational Therapy

## 2020-10-24 ENCOUNTER — Other Ambulatory Visit (HOSPITAL_COMMUNITY): Payer: Self-pay | Admitting: Cardiology

## 2020-10-24 ENCOUNTER — Other Ambulatory Visit: Payer: Self-pay | Admitting: Cardiology

## 2020-10-24 DIAGNOSIS — I2510 Atherosclerotic heart disease of native coronary artery without angina pectoris: Secondary | ICD-10-CM

## 2020-10-24 DIAGNOSIS — R079 Chest pain, unspecified: Secondary | ICD-10-CM

## 2020-10-26 ENCOUNTER — Encounter: Payer: Medicare Other | Admitting: Occupational Therapy

## 2020-10-27 ENCOUNTER — Inpatient Hospital Stay
Admission: EM | Admit: 2020-10-27 | Discharge: 2020-11-22 | DRG: 291 | Disposition: E | Payer: Medicare Other | Attending: Internal Medicine | Admitting: Internal Medicine

## 2020-10-27 ENCOUNTER — Other Ambulatory Visit: Payer: Self-pay

## 2020-10-27 ENCOUNTER — Emergency Department: Payer: Medicare Other

## 2020-10-27 DIAGNOSIS — Z7951 Long term (current) use of inhaled steroids: Secondary | ICD-10-CM

## 2020-10-27 DIAGNOSIS — M1990 Unspecified osteoarthritis, unspecified site: Secondary | ICD-10-CM | POA: Diagnosis present

## 2020-10-27 DIAGNOSIS — E7800 Pure hypercholesterolemia, unspecified: Secondary | ICD-10-CM | POA: Diagnosis present

## 2020-10-27 DIAGNOSIS — Z515 Encounter for palliative care: Secondary | ICD-10-CM | POA: Diagnosis not present

## 2020-10-27 DIAGNOSIS — Z853 Personal history of malignant neoplasm of breast: Secondary | ICD-10-CM | POA: Diagnosis not present

## 2020-10-27 DIAGNOSIS — F419 Anxiety disorder, unspecified: Secondary | ICD-10-CM | POA: Diagnosis present

## 2020-10-27 DIAGNOSIS — Z66 Do not resuscitate: Secondary | ICD-10-CM | POA: Diagnosis present

## 2020-10-27 DIAGNOSIS — J9811 Atelectasis: Secondary | ICD-10-CM

## 2020-10-27 DIAGNOSIS — K7581 Nonalcoholic steatohepatitis (NASH): Secondary | ICD-10-CM

## 2020-10-27 DIAGNOSIS — Z20822 Contact with and (suspected) exposure to covid-19: Secondary | ICD-10-CM | POA: Diagnosis present

## 2020-10-27 DIAGNOSIS — G4733 Obstructive sleep apnea (adult) (pediatric): Secondary | ICD-10-CM | POA: Diagnosis present

## 2020-10-27 DIAGNOSIS — Z833 Family history of diabetes mellitus: Secondary | ICD-10-CM

## 2020-10-27 DIAGNOSIS — E785 Hyperlipidemia, unspecified: Secondary | ICD-10-CM | POA: Diagnosis present

## 2020-10-27 DIAGNOSIS — J9621 Acute and chronic respiratory failure with hypoxia: Secondary | ICD-10-CM

## 2020-10-27 DIAGNOSIS — E1122 Type 2 diabetes mellitus with diabetic chronic kidney disease: Secondary | ICD-10-CM | POA: Diagnosis present

## 2020-10-27 DIAGNOSIS — Z6841 Body Mass Index (BMI) 40.0 and over, adult: Secondary | ICD-10-CM | POA: Diagnosis not present

## 2020-10-27 DIAGNOSIS — Z882 Allergy status to sulfonamides status: Secondary | ICD-10-CM

## 2020-10-27 DIAGNOSIS — N1831 Chronic kidney disease, stage 3a: Secondary | ICD-10-CM | POA: Diagnosis present

## 2020-10-27 DIAGNOSIS — R079 Chest pain, unspecified: Secondary | ICD-10-CM | POA: Diagnosis not present

## 2020-10-27 DIAGNOSIS — R0902 Hypoxemia: Secondary | ICD-10-CM

## 2020-10-27 DIAGNOSIS — K2270 Barrett's esophagus without dysplasia: Secondary | ICD-10-CM | POA: Diagnosis present

## 2020-10-27 DIAGNOSIS — Z950 Presence of cardiac pacemaker: Secondary | ICD-10-CM

## 2020-10-27 DIAGNOSIS — Z886 Allergy status to analgesic agent status: Secondary | ICD-10-CM

## 2020-10-27 DIAGNOSIS — M810 Age-related osteoporosis without current pathological fracture: Secondary | ICD-10-CM | POA: Diagnosis present

## 2020-10-27 DIAGNOSIS — I872 Venous insufficiency (chronic) (peripheral): Secondary | ICD-10-CM | POA: Diagnosis present

## 2020-10-27 DIAGNOSIS — I890 Lymphedema, not elsewhere classified: Secondary | ICD-10-CM

## 2020-10-27 DIAGNOSIS — I459 Conduction disorder, unspecified: Secondary | ICD-10-CM | POA: Diagnosis present

## 2020-10-27 DIAGNOSIS — I482 Chronic atrial fibrillation, unspecified: Secondary | ICD-10-CM | POA: Diagnosis present

## 2020-10-27 DIAGNOSIS — Z823 Family history of stroke: Secondary | ICD-10-CM

## 2020-10-27 DIAGNOSIS — R062 Wheezing: Secondary | ICD-10-CM | POA: Diagnosis not present

## 2020-10-27 DIAGNOSIS — I130 Hypertensive heart and chronic kidney disease with heart failure and stage 1 through stage 4 chronic kidney disease, or unspecified chronic kidney disease: Secondary | ICD-10-CM | POA: Diagnosis present

## 2020-10-27 DIAGNOSIS — I2089 Other forms of angina pectoris: Secondary | ICD-10-CM | POA: Diagnosis present

## 2020-10-27 DIAGNOSIS — R0600 Dyspnea, unspecified: Principal | ICD-10-CM

## 2020-10-27 DIAGNOSIS — I714 Abdominal aortic aneurysm, without rupture: Secondary | ICD-10-CM | POA: Diagnosis present

## 2020-10-27 DIAGNOSIS — Z9012 Acquired absence of left breast and nipple: Secondary | ICD-10-CM

## 2020-10-27 DIAGNOSIS — Z8249 Family history of ischemic heart disease and other diseases of the circulatory system: Secondary | ICD-10-CM

## 2020-10-27 DIAGNOSIS — Z96649 Presence of unspecified artificial hip joint: Secondary | ICD-10-CM | POA: Diagnosis present

## 2020-10-27 DIAGNOSIS — I25110 Atherosclerotic heart disease of native coronary artery with unstable angina pectoris: Secondary | ICD-10-CM | POA: Diagnosis present

## 2020-10-27 DIAGNOSIS — I10 Essential (primary) hypertension: Secondary | ICD-10-CM | POA: Diagnosis not present

## 2020-10-27 DIAGNOSIS — K5900 Constipation, unspecified: Secondary | ICD-10-CM | POA: Diagnosis not present

## 2020-10-27 DIAGNOSIS — E869 Volume depletion, unspecified: Secondary | ICD-10-CM | POA: Diagnosis present

## 2020-10-27 DIAGNOSIS — Z881 Allergy status to other antibiotic agents status: Secondary | ICD-10-CM

## 2020-10-27 DIAGNOSIS — Z803 Family history of malignant neoplasm of breast: Secondary | ICD-10-CM

## 2020-10-27 DIAGNOSIS — Z794 Long term (current) use of insulin: Secondary | ICD-10-CM

## 2020-10-27 DIAGNOSIS — K219 Gastro-esophageal reflux disease without esophagitis: Secondary | ICD-10-CM | POA: Diagnosis present

## 2020-10-27 DIAGNOSIS — E662 Morbid (severe) obesity with alveolar hypoventilation: Secondary | ICD-10-CM | POA: Diagnosis present

## 2020-10-27 DIAGNOSIS — J449 Chronic obstructive pulmonary disease, unspecified: Secondary | ICD-10-CM | POA: Diagnosis present

## 2020-10-27 DIAGNOSIS — M7989 Other specified soft tissue disorders: Secondary | ICD-10-CM | POA: Diagnosis present

## 2020-10-27 DIAGNOSIS — I248 Other forms of acute ischemic heart disease: Secondary | ICD-10-CM | POA: Diagnosis present

## 2020-10-27 DIAGNOSIS — Z801 Family history of malignant neoplasm of trachea, bronchus and lung: Secondary | ICD-10-CM

## 2020-10-27 DIAGNOSIS — Z7189 Other specified counseling: Secondary | ICD-10-CM | POA: Diagnosis not present

## 2020-10-27 DIAGNOSIS — E119 Type 2 diabetes mellitus without complications: Secondary | ICD-10-CM

## 2020-10-27 DIAGNOSIS — E46 Unspecified protein-calorie malnutrition: Secondary | ICD-10-CM | POA: Diagnosis present

## 2020-10-27 DIAGNOSIS — N179 Acute kidney failure, unspecified: Secondary | ICD-10-CM | POA: Diagnosis not present

## 2020-10-27 DIAGNOSIS — R609 Edema, unspecified: Secondary | ICD-10-CM

## 2020-10-27 DIAGNOSIS — M419 Scoliosis, unspecified: Secondary | ICD-10-CM | POA: Diagnosis present

## 2020-10-27 DIAGNOSIS — Z7902 Long term (current) use of antithrombotics/antiplatelets: Secondary | ICD-10-CM

## 2020-10-27 DIAGNOSIS — Z885 Allergy status to narcotic agent status: Secondary | ICD-10-CM

## 2020-10-27 DIAGNOSIS — Z8673 Personal history of transient ischemic attack (TIA), and cerebral infarction without residual deficits: Secondary | ICD-10-CM

## 2020-10-27 DIAGNOSIS — E1140 Type 2 diabetes mellitus with diabetic neuropathy, unspecified: Secondary | ICD-10-CM | POA: Diagnosis not present

## 2020-10-27 DIAGNOSIS — R0609 Other forms of dyspnea: Secondary | ICD-10-CM

## 2020-10-27 DIAGNOSIS — R0602 Shortness of breath: Secondary | ICD-10-CM | POA: Diagnosis present

## 2020-10-27 DIAGNOSIS — J80 Acute respiratory distress syndrome: Secondary | ICD-10-CM | POA: Diagnosis present

## 2020-10-27 DIAGNOSIS — I5033 Acute on chronic diastolic (congestive) heart failure: Secondary | ICD-10-CM | POA: Diagnosis present

## 2020-10-27 DIAGNOSIS — Z87891 Personal history of nicotine dependence: Secondary | ICD-10-CM

## 2020-10-27 DIAGNOSIS — Z880 Allergy status to penicillin: Secondary | ICD-10-CM

## 2020-10-27 DIAGNOSIS — N183 Chronic kidney disease, stage 3 unspecified: Secondary | ICD-10-CM | POA: Diagnosis present

## 2020-10-27 DIAGNOSIS — I208 Other forms of angina pectoris: Secondary | ICD-10-CM | POA: Diagnosis present

## 2020-10-27 DIAGNOSIS — I509 Heart failure, unspecified: Secondary | ICD-10-CM

## 2020-10-27 DIAGNOSIS — Z79899 Other long term (current) drug therapy: Secondary | ICD-10-CM

## 2020-10-27 LAB — TROPONIN I (HIGH SENSITIVITY)
Troponin I (High Sensitivity): 24 ng/L — ABNORMAL HIGH (ref <18)
Troponin I (High Sensitivity): 25 ng/L — ABNORMAL HIGH (ref <18)

## 2020-10-27 LAB — CBC WITH DIFFERENTIAL/PLATELET
Abs Immature Granulocytes: 0.01 10*3/uL (ref 0.00–0.07)
Basophils Absolute: 0 10*3/uL (ref 0.0–0.1)
Basophils Relative: 1 %
Eosinophils Absolute: 0.8 10*3/uL — ABNORMAL HIGH (ref 0.0–0.5)
Eosinophils Relative: 16 %
HCT: 29.9 % — ABNORMAL LOW (ref 36.0–46.0)
Hemoglobin: 9.4 g/dL — ABNORMAL LOW (ref 12.0–15.0)
Immature Granulocytes: 0 %
Lymphocytes Relative: 11 %
Lymphs Abs: 0.5 10*3/uL — ABNORMAL LOW (ref 0.7–4.0)
MCH: 27.9 pg (ref 26.0–34.0)
MCHC: 31.4 g/dL (ref 30.0–36.0)
MCV: 88.7 fL (ref 80.0–100.0)
Monocytes Absolute: 0.5 10*3/uL (ref 0.1–1.0)
Monocytes Relative: 11 %
Neutro Abs: 2.9 10*3/uL (ref 1.7–7.7)
Neutrophils Relative %: 61 %
Platelets: 161 10*3/uL (ref 150–400)
RBC: 3.37 MIL/uL — ABNORMAL LOW (ref 3.87–5.11)
RDW: 13.3 % (ref 11.5–15.5)
WBC: 4.8 10*3/uL (ref 4.0–10.5)
nRBC: 0 % (ref 0.0–0.2)

## 2020-10-27 LAB — BASIC METABOLIC PANEL
Anion gap: 9 (ref 5–15)
BUN: 63 mg/dL — ABNORMAL HIGH (ref 8–23)
CO2: 27 mmol/L (ref 22–32)
Calcium: 9.1 mg/dL (ref 8.9–10.3)
Chloride: 100 mmol/L (ref 98–111)
Creatinine, Ser: 1.4 mg/dL — ABNORMAL HIGH (ref 0.44–1.00)
GFR, Estimated: 37 mL/min — ABNORMAL LOW (ref >60)
Glucose, Bld: 210 mg/dL — ABNORMAL HIGH (ref 70–99)
Potassium: 5.2 mmol/L — ABNORMAL HIGH (ref 3.5–5.1)
Sodium: 136 mmol/L (ref 135–145)

## 2020-10-27 LAB — D-DIMER, QUANTITATIVE: D-Dimer, Quant: 1.05 ug/mL-FEU — ABNORMAL HIGH (ref 0.00–0.50)

## 2020-10-27 LAB — BRAIN NATRIURETIC PEPTIDE: B Natriuretic Peptide: 593.5 pg/mL — ABNORMAL HIGH (ref 0.0–100.0)

## 2020-10-27 LAB — GLUCOSE, CAPILLARY: Glucose-Capillary: 183 mg/dL — ABNORMAL HIGH (ref 70–99)

## 2020-10-27 MED ORDER — ALPRAZOLAM 0.5 MG PO TABS: 0.5000 mg | ORAL_TABLET | Freq: Three times a day (TID) | ORAL | Status: DC | PRN

## 2020-10-27 MED ORDER — SIMVASTATIN 20 MG PO TABS
20.0000 mg | ORAL_TABLET | Freq: Every day | ORAL | Status: DC
  Administered 2020-10-28 – 2020-11-13 (×17): 20 mg via ORAL
  Filled 2020-10-27 (×17): qty 1

## 2020-10-27 MED ORDER — CHLORHEXIDINE GLUCONATE 0.12 % MT SOLN
15.0000 mL | Freq: Two times a day (BID) | OROMUCOSAL | Status: DC
  Administered 2020-10-28 – 2020-11-21 (×43): 15 mL via OROMUCOSAL
  Filled 2020-10-27 (×42): qty 15

## 2020-10-27 MED ORDER — LIDOCAINE 5 % EX PTCH
1.0000 | MEDICATED_PATCH | CUTANEOUS | Status: DC
  Administered 2020-10-27: 1 via TRANSDERMAL
  Filled 2020-10-27: qty 1

## 2020-10-27 MED ORDER — ALPRAZOLAM 0.5 MG PO TABS
0.5000 mg | ORAL_TABLET | Freq: Every day | ORAL | Status: DC | PRN
  Administered 2020-10-28 – 2020-11-11 (×7): 0.5 mg via ORAL
  Filled 2020-10-27 (×6): qty 1

## 2020-10-27 MED ORDER — CLOPIDOGREL BISULFATE 75 MG PO TABS
75.0000 mg | ORAL_TABLET | Freq: Every day | ORAL | Status: DC
  Administered 2020-10-28 – 2020-11-04 (×8): 75 mg via ORAL
  Filled 2020-10-27 (×8): qty 1

## 2020-10-27 MED ORDER — ALPRAZOLAM 0.5 MG PO TABS
0.5000 mg | ORAL_TABLET | Freq: Three times a day (TID) | ORAL | Status: DC | PRN
  Administered 2020-10-27: 0.5 mg via ORAL
  Filled 2020-10-27: qty 1

## 2020-10-27 MED ORDER — ORAL CARE MOUTH RINSE
15.0000 mL | Freq: Two times a day (BID) | OROMUCOSAL | Status: DC
  Administered 2020-10-28 – 2020-11-18 (×22): 15 mL via OROMUCOSAL

## 2020-10-27 MED ORDER — CHLORHEXIDINE GLUCONATE CLOTH 2 % EX PADS
6.0000 | MEDICATED_PAD | Freq: Every day | CUTANEOUS | Status: DC
  Administered 2020-10-28 – 2020-11-17 (×12): 6 via TOPICAL

## 2020-10-27 MED ORDER — POTASSIUM CHLORIDE CRYS ER 20 MEQ PO TBCR
20.0000 meq | EXTENDED_RELEASE_TABLET | Freq: Every day | ORAL | Status: DC
  Administered 2020-10-29 – 2020-10-30 (×2): 20 meq via ORAL
  Filled 2020-10-27 (×3): qty 1

## 2020-10-27 MED ORDER — TORSEMIDE 20 MG PO TABS: 20.0000 mg | ORAL_TABLET | Freq: Every day | ORAL | Status: DC

## 2020-10-27 MED ORDER — MUPIROCIN 2 % EX OINT
TOPICAL_OINTMENT | Freq: Two times a day (BID) | CUTANEOUS | Status: DC
  Administered 2020-10-30 – 2020-11-20 (×7): 1 via TOPICAL
  Filled 2020-10-27: qty 22

## 2020-10-27 MED ORDER — ALPRAZOLAM 0.5 MG PO TABS
0.5000 mg | ORAL_TABLET | Freq: Two times a day (BID) | ORAL | Status: DC
  Administered 2020-10-28 – 2020-11-20 (×47): 0.5 mg via ORAL
  Filled 2020-10-27 (×48): qty 1

## 2020-10-27 MED ORDER — ALPRAZOLAM 0.5 MG PO TABS: 0.5000 mg | ORAL_TABLET | Freq: Every day | ORAL | Status: DC

## 2020-10-27 MED ORDER — MAGNESIUM HYDROXIDE 400 MG/5ML PO SUSP
30.0000 mL | Freq: Every evening | ORAL | Status: DC | PRN
  Administered 2020-11-11: 30 mL via ORAL
  Filled 2020-10-27: qty 30

## 2020-10-27 MED ORDER — TRIAMCINOLONE ACETONIDE 0.1 % EX CREA
TOPICAL_CREAM | Freq: Two times a day (BID) | CUTANEOUS | Status: DC
  Administered 2020-11-02 – 2020-11-20 (×6): 1 via TOPICAL
  Filled 2020-10-27 (×3): qty 15

## 2020-10-27 MED ORDER — ACETAMINOPHEN 325 MG PO TABS
650.0000 mg | ORAL_TABLET | ORAL | Status: DC
  Administered 2020-10-28 – 2020-11-01 (×5): 650 mg via ORAL
  Filled 2020-10-27 (×6): qty 2

## 2020-10-27 MED ORDER — INSULIN ASPART 100 UNIT/ML IJ SOLN
0.0000 [IU] | Freq: Three times a day (TID) | INTRAMUSCULAR | Status: DC
  Administered 2020-10-27: 4 [IU] via SUBCUTANEOUS
  Filled 2020-10-27: qty 1

## 2020-10-27 MED ORDER — PANTOPRAZOLE SODIUM 40 MG PO TBEC
40.0000 mg | DELAYED_RELEASE_TABLET | Freq: Every day | ORAL | Status: DC
  Administered 2020-10-28 – 2020-11-21 (×23): 40 mg via ORAL
  Filled 2020-10-27 (×23): qty 1

## 2020-10-27 MED ORDER — BUDESONIDE 0.5 MG/2ML IN SUSP
0.5000 mg | Freq: Every day | RESPIRATORY_TRACT | Status: DC
  Administered 2020-10-28 – 2020-10-30 (×3): 0.5 mg via RESPIRATORY_TRACT
  Filled 2020-10-27 (×4): qty 2

## 2020-10-27 MED ORDER — FUROSEMIDE 10 MG/ML IJ SOLN
40.0000 mg | Freq: Once | INTRAMUSCULAR | Status: AC
  Administered 2020-10-27: 40 mg via INTRAVENOUS
  Filled 2020-10-27: qty 4

## 2020-10-27 MED ORDER — ADULT MULTIVITAMIN W/MINERALS CH
1.0000 | ORAL_TABLET | ORAL | Status: DC
  Administered 2020-10-28 – 2020-11-17 (×12): 1 via ORAL
  Filled 2020-10-27 (×15): qty 1

## 2020-10-27 MED ORDER — INSULIN ASPART PROT & ASPART (70-30 MIX) 100 UNIT/ML ~~LOC~~ SUSP
15.0000 [IU] | Freq: Two times a day (BID) | SUBCUTANEOUS | Status: DC
  Administered 2020-10-28 – 2020-11-01 (×8): 15 [IU] via SUBCUTANEOUS
  Filled 2020-10-27 (×8): qty 10

## 2020-10-27 MED ORDER — CALCIUM CARBONATE ANTACID 500 MG PO CHEW
1.0000 | CHEWABLE_TABLET | ORAL | Status: DC | PRN
  Administered 2020-11-02: 200 mg via ORAL
  Filled 2020-10-27: qty 1

## 2020-10-27 MED ORDER — ACETAMINOPHEN 500 MG PO TABS
1000.0000 mg | ORAL_TABLET | Freq: Once | ORAL | Status: AC
  Administered 2020-10-27: 1000 mg via ORAL
  Filled 2020-10-27: qty 2

## 2020-10-27 MED ORDER — LOSARTAN POTASSIUM 25 MG PO TABS
25.0000 mg | ORAL_TABLET | Freq: Every day | ORAL | Status: DC
  Administered 2020-10-28: 25 mg via ORAL
  Filled 2020-10-27: qty 1

## 2020-10-27 NOTE — ED Notes (Addendum)
Pt reports worsening shortness of breath and chest pain. Pt noted to have increased work of breathing with oxygen saturations in the low 90's. Sharion Settler notified.

## 2020-10-27 NOTE — ED Provider Notes (Signed)
[facility] Emergency Department Provider Note   ____________________________________________   Event Date/Time   First MD Initiated Contact with Patient 11/03/2020 1629     (approximate)  I have reviewed the triage vital signs and the nursing notes.   HISTORY  Chief Complaint Shortness of Breath    HPI Teresa Krueger is a 87 y.o. female with past medical history of hypertension, hyperlipidemia, diabetes, COPD, and CKD who presents to the ED for shortness of breath.  Patient reports that she has been feeling increasingly short of breath over the past week.  This is present at rest but is significantly worse with any exertion.  It has been associated with pain extending down from her neck into her upper back as well as into her chest.  She describes the pain as a pressure that is also worse with exertion.  She has noticed increasing swelling in her legs and also has been coughing up a pinkish frothy sputum.  She denies any fevers and has minimal chest pain currently.  She typically only requires supplemental oxygen at night.  She was seen by her cardiologist for this problem earlier in the week, was set up with Holter monitor and stress testing.  She has not yet received results for Holter monitor and has not yet undergone stress testing.        Past Medical History:  Diagnosis Date  . Arthritis   . Asthma   . Barrett's esophagus   . Breast cancer ([place]) 1999   LT MASTECTOMY  . Bronchiectasis ([place])   . COPD (chronic obstructive pulmonary disease) ([place])   . Diabetes mellitus ([place])   . Hypercholesterolemia   . Hypertension   . Lymphedema   . Mini stroke ([place])   . Osteoporosis   . Pacemaker     Patient Active Problem List   Diagnosis Date Noted  . Pedal edema 05/02/2020  . Lightheadedness 03/20/2018  . Syncope 03/09/2018  . CKD (chronic kidney disease) stage 3, GFR 30-59 ml/min (HCC) 02/14/2017  . Carotid artery stenosis 10/29/2016  . TIA  (transient ischemic attack) 10/14/2016  . Chronic respiratory failure with hypoxia ([place]) 05/24/2016  . Aortic stenosis, mild 05/02/2016  . SOB (shortness of breath) on exertion 05/02/2016  . Swelling of limb 04/10/2016  . Varicose veins of leg with pain, bilateral 04/10/2016  . Lymphedema 04/10/2016  . Osteoarthritis 03/19/2016  . Osteoporosis 03/19/2016  . Coronary artery calcification seen on CAT scan 02/08/2016  . Incidental lung nodule 02/08/2016  . Thoracic aortic atherosclerosis ([place]) 02/08/2016  . S/P placement of cardiac pacemaker 03/30/2015  . Morbid obesity ([place]) 03/17/2015  . Hypersomnia with sleep apnea 03/17/2015  . Mobitz type 2 second degree AV block 03/16/2015  . Multiple pulmonary nodules 09/13/2014  . Lung field abnormal 09/13/2014  . Barrett's esophagus 12/21/2013  . History of adenomatous polyp of colon 12/21/2013  . Hx of adenomatous colonic polyps 12/21/2013  . Benign essential hypertension 11/30/2013  . Acromioclavicular joint arthritis 11/23/2013  . Glenohumeral arthritis 11/23/2013  . Chest pain 10/21/2013  . Adult body mass index 50.0-59.9 ([place]) 02/09/2013  . Morbid obesity with BMI of 50.0-59.9, adult ([place]) 02/09/2013  . Primary localized osteoarthrosis, lower leg 02/09/2013  . Bronchiectasis ([place]) 05/19/2012  . Chronic bronchitis 12/31/2011  . Cough 12/31/2011  . GERD (gastroesophageal reflux disease) 12/31/2011  . Diabetes mellitus ([place])   . Hypertension     Past Surgical History:  Procedure Laterality Date  . CARPAL TUNNEL RELEASE  right  . CATARACT EXTRACTION     bilateral  . GALLBLADDER SURGERY    . MASTECTOMY Left 1999   BREAST CA  . PACEMAKER INSERTION Right 03/21/2015   Procedure: INSERTION PACEMAKER;  Surgeon: Isaias Cowman, MD;  Location: [facility] ORS;  Service: Cardiovascular;  Laterality: Right;  . TOTAL HIP ARTHROPLASTY  2001   left  . TRIGGER FINGER RELEASE     bilateral    Prior to Admission medications   Medication  Sig Start Date End Date Taking? Authorizing Provider  acetaminophen (TYLENOL) 650 MG CR tablet Take 650 mg by mouth every morning.    [provider]  ALPRAZolam Duanne Moron) 0.5 MG tablet Take 1 tablet (0.5 mg total) by mouth 3 (three) times daily as needed for anxiety. Patient taking differently: Take 0.5 mg by mouth 3 (three) times daily as needed for anxiety. Take 1 tablet (0.5MG ) by mouth twice daily - may take a third tablet daily if needed for anxiety 03/22/15   Adin Hector, MD  AMBULATORY NON FORMULARY MEDICATION Medication Name: incentive spirometry use 10-15 times daily. Dx:J47.9 10/14/16   Flora Lipps, MD  budesonide (PULMICORT) 0.5 MG/2ML nebulizer solution SMARTSIG:1 Vial(s) Via Nebulizer Every Evening 01/04/20   [provider]  calcium carbonate (TUMS - DOSED IN MG ELEMENTAL CALCIUM) 500 MG chewable tablet Chew 1 tablet by mouth as needed for indigestion or heartburn.    [provider]  clindamycin (CLEOCIN) 300 MG capsule TAKE 2 CAPSULES BY MOUTH 1 HOUR BEFORE APPOINTMENT 09/21/19   [provider]  clopidogrel (PLAVIX) 75 MG tablet Take 1 tablet (75 mg total) by mouth daily. 10/16/16   Fritzi Mandes, MD  COVID-19 mRNA vaccine, Pfizer, 30 MCG/0.3ML injection USE AS DIRECTED 05/30/20 05/30/21  Carlyle Basques, MD  insulin lispro protamine-lispro (HUMALOG 75/25 MIX) (75-25) 100 UNIT/ML SUSP injection Inject 15 Units into the skin 2 (two) times daily. Patient taking differently: Inject 10 Units into the skin 2 (two) times daily. Inject up to 15u under the skin twice daily 03/22/15   Tama High III, MD  KLOR-CON M20 20 MEQ tablet Take 20 mEq by mouth every other day.     [provider]  losartan (COZAAR) 25 MG tablet Take 25 mg by mouth daily.    [provider]  Magnesium Hydroxide (MILK OF MAGNESIA PO) Take by mouth as needed.    [provider]  Multiple Vitamin (MULTIVITAMIN WITH MINERALS) TABS tablet Take 1 tablet by mouth  every other day.    [provider]  mupirocin ointment (BACTROBAN) 2 %  01/05/20   [provider]  simvastatin (ZOCOR) 20 MG tablet Take 20 mg by mouth daily.    [provider]  torsemide (DEMADEX) 20 MG tablet Take 20 mg by mouth every other day.     [provider]  triamcinolone cream (KENALOG) 0.1 % Apply topically 2 (two) times daily. 11/04/19   [provider]    Allergies Aspirin, Celebrex [celecoxib], Meloxicam, Nsaids, Quetiapine, Rofecoxib, Sulfa antibiotics, Tolmetin, Bactrim [sulfamethoxazole-trimethoprim], Biguanide copolymer, Buprenorphine hcl, Cetirizine, Ciprofloxacin, Codeine, Dextromethorphan hbr, Dextromethorphan polistirex er, Furosemide, Hydrocodone, Metformin, Morphine and related, Penicillins, Pregabalin, Promethazine, Propoxyphene, Propoxyphene, Quinolones, Seroquel [quetiapine fumarate], Serotonin reuptake inhibitors (ssris), Tramadol, and Ultracet [tramadol-acetaminophen]  Family History  Problem Relation Age of Onset  . Heart disease Paternal Grandmother   . Diabetes Paternal Grandmother   . Heart disease Father   . Heart disease Mother   . Heart disease Maternal Grandmother   .  Stroke Maternal Aunt   . Breast cancer Paternal Aunt 94  . Lung cancer Brother 61  . Kidney disease Cousin   . Bladder Cancer Neg Hx     Social History Social History   Tobacco Use  . Smoking status: Former Smoker    Packs/day: 1.00    Years: 30.00    Pack years: 30.00    Types: Cigarettes    Quit date: 12/27/1992    Years since quitting: 27.8  . Smokeless tobacco: Never Used  Substance Use Topics  . Alcohol use: No  . Drug use: No    Review of Systems  Constitutional: No fever/chills Eyes: No visual changes. ENT: No sore throat. Cardiovascular: Positive for chest pain. Respiratory: Positive for cough and shortness of breath. Gastrointestinal: No abdominal pain.  No nausea, no vomiting.  No diarrhea.  No  constipation. Genitourinary: Negative for dysuria. Musculoskeletal: Positive for neck and back pain.  Positive for leg swelling. Skin: Negative for rash. Neurological: Negative for headaches, focal weakness or numbness.  ____________________________________________   PHYSICAL EXAM:  VITAL SIGNS: ED Triage Vitals  Enc Vitals Group     BP 11/03/2020 1625 (!) 147/45     Pulse Rate 11/17/2020 1625 74     Resp 11/09/2020 1625 16     Temp 10/29/2020 1626 98 F (36.7 C)     Temp Source 11/11/2020 1626 Oral     SpO2 11/15/2020 1625 94 %     Weight 10/31/2020 1622 257 lb 15 oz (117 kg)     Height 11/17/2020 1622 4\' 11"  (1.499 m)     Head Circumference --      Peak Flow --      Pain Score 11/16/2020 1622 7     Pain Loc --      Pain Edu? --      Excl. in [place]? --     Constitutional: Alert and oriented. Eyes: Conjunctivae are normal. Head: Atraumatic. Nose: No congestion/rhinnorhea. Mouth/Throat: Mucous membranes are moist. Neck: Normal ROM Cardiovascular: Normal rate, regular rhythm.  Systolic murmur noted.  2+ radial pulses bilaterally. Respiratory: Normal respiratory effort.  No retractions. Lungs CTAB. Gastrointestinal: Soft and nontender. No distention. Genitourinary: deferred Musculoskeletal: 2+ pitting edema to knees bilaterally.  Midline cervical and thoracic spinal tenderness to palpation. Neurologic:  Normal speech and language. No gross focal neurologic deficits are appreciated. Skin:  Skin is warm, dry and intact. No rash noted. Psychiatric: Mood and affect are normal. Speech and behavior are normal.  ____________________________________________   LABS (all labs ordered are listed, but only abnormal results are displayed)  Labs Reviewed  BASIC METABOLIC PANEL - Abnormal; Notable for the following components:      Result Value   Potassium 5.2 (*)    Glucose, Bld 210 (*)    BUN 63 (*)    Creatinine, Ser 1.40 (*)    GFR, Estimated 37 (*)    All other components within normal  limits  CBC WITH DIFFERENTIAL/PLATELET - Abnormal; Notable for the following components:   RBC 3.37 (*)    Hemoglobin 9.4 (*)    HCT 29.9 (*)    Lymphs Abs 0.5 (*)    Eosinophils Absolute 0.8 (*)    All other components within normal limits  BRAIN NATRIURETIC PEPTIDE - Abnormal; Notable for the following components:   B Natriuretic Peptide 593.5 (*)    All other components within normal limits  TROPONIN I (HIGH SENSITIVITY) - Abnormal; Notable for the following components:   Troponin I (  High Sensitivity) 24 (*)    All other components within normal limits  SARS CORONAVIRUS 2 (TAT 6-24 HRS)  TROPONIN I (HIGH SENSITIVITY)   ____________________________________________  EKG  ED ECG REPORT I, Blake Divine, the attending physician, personally viewed and interpreted this ECG.   Date: 11/10/2020  EKG Time: 16:25  Rate: 76  Rhythm: normal sinus rhythm  Axis: LAD  Intervals:left bundle branch block  ST&T Change: None, negative sgarbossa   PROCEDURES  Procedure(s) performed (including Critical Care):  Procedures   ____________________________________________   INITIAL IMPRESSION / ASSESSMENT AND PLAN / ED COURSE       87 year old female with past medical history of hypertension, hyperlipidemia, diabetes, and COPD on oxygen at night who presents to the ED with worsening dyspnea on exertion as well as chest pressure on exertion with pain extending into her neck and back.  She has minimal pain or difficulty breathing at rest but has been severely functionally limited due to her symptoms.  She was seen by her cardiology team earlier this week and stress test was ordered, however this has not yet been completed.  EKG today shows left bundle branch block similar to previous with no acute ischemic changes.  We will check troponin and additional labs including BNP.  Chest x-ray reviewed by me and is limited due to patient's body habitus, there is possible mild edema but no focal  infiltrate.  Patient does appear fluid overloaded and with her cough productive of frothy sputum, I would be concerned for diastolic CHF.  Troponin and BNP mildly elevated, we will give dose of IV Lasix given concern for CHF.  Patient additionally with mild hyperkalemia, which should benefit from dose of Lasix.  Patient reports remote allergy to Lasix however she has tolerated torsemide without difficulty in the past, benefits of Lasix seem to outweigh risks at this time.  We will trend troponin and case discussed with hospitalist for admission.      ____________________________________________   FINAL CLINICAL IMPRESSION(S) / ED DIAGNOSES  Final diagnoses:  Dyspnea on exertion  Chest pain, unspecified type     ED Discharge Orders    None       Note:  This document was prepared using Dragon voice recognition software and may include unintentional dictation errors.   Blake Divine, MD 10/25/2020 [phone]

## 2020-10-27 NOTE — ED Triage Notes (Signed)
Reports increase in SOB over last week, wears oxygen intermittently at night. Reports back to back of neck and down into chest X "several weeks"

## 2020-10-27 NOTE — H&P (Signed)
History and Physical   Teresa Krueger [id number] DOB: [birth year] DOA: 11/14/2020  Referring MD/NP/PA: Dr. Charna Archer  PCP: Adin Hector, MD   Outpatient Specialists: [facility] cardiology  Patient coming from: Home  Chief Complaint: Chest pain or shortness of breath  HPI: Teresa Krueger is a 87 y.o. female with medical history significant of coronary artery disease, chronic lymphedema, COPD, chronic kidney disease stage III, hyperlipidemia, hypertension, diabetes and morbid obesity who was being worked up for angina at home.  Patient was supposed to have a cardiac stress test this week.  She started having chest pressure today shortness of breath.  The shortness of breath has been progressive over the last week.  Currently present even at rest without exertion.  She has pain that is been going on in the left side going to the neck and the back.  It is more pressure that is worse with exertion.  Patient also has noted progressive worsening of leg swelling.  She has cough and frothy sputum.  No fever or chills.  No sick contact.  Patient uses oxygen only at night at about 2 L.  Patient also recently was set up with a Holter monitor which she completed but the results are still pending.  She came to the ER where she was found to be hypoxic evidence of fluid overload as well as anxiety.  Patient progressively got worse in the ER and now is on BiPAP.  She is hypoxic and appears to have evidence of anasarca.  She has been admitted to the hospital for angina with acute on chronic respiratory failure with hypoxia.  ED Course: temperature 98 blood pressure 153/55, pulse 94 respirate of 29 oxygen sat 92% on room air.  Currently 96% on BiPAP.  Hemoglobin is 9.4.  Potassium 5.2.  BUN 63 and creatinine 1.40.  Glucose is 210.  BNP is 593.  Initial troponin 24-second troponin 25.  COVID-19 screen is currently pending.  Chest x-ray showed slight left midlung atelectasis.  No edema or airspace  opacity.  Review of Systems: As per HPI otherwise 10 point review of systems negative.    Past Medical History:  Diagnosis Date  . Arthritis   . Asthma   . Barrett's esophagus   . Breast cancer ([place]) 1999   LT MASTECTOMY  . Bronchiectasis ([place])   . COPD (chronic obstructive pulmonary disease) ([place])   . Diabetes mellitus ([place])   . Hypercholesterolemia   . Hypertension   . Lymphedema   . Mini stroke ([place])   . Osteoporosis   . Pacemaker     Past Surgical History:  Procedure Laterality Date  . CARPAL TUNNEL RELEASE     right  . CATARACT EXTRACTION     bilateral  . GALLBLADDER SURGERY    . MASTECTOMY Left 1999   BREAST CA  . PACEMAKER INSERTION Right 03/21/2015   Procedure: INSERTION PACEMAKER;  Surgeon: Isaias Cowman, MD;  Location: [facility] ORS;  Service: Cardiovascular;  Laterality: Right;  . TOTAL HIP ARTHROPLASTY  2001   left  . TRIGGER FINGER RELEASE     bilateral     reports that she quit smoking about 27 years ago. Her smoking use included cigarettes. She has a 30.00 pack-year smoking history. She has never used smokeless tobacco. She reports that she does not drink alcohol and does not use drugs.  Allergies  Allergen Reactions  . Aspirin Swelling and Other (See Comments)    Pt states that she has tongue swelling.    Marland Kitchen  Celebrex [Celecoxib] Anaphylaxis  . Meloxicam Swelling  . Nsaids Anaphylaxis, Other (See Comments) and Nausea And Vomiting    unknown   . Quetiapine Anaphylaxis    intolerant  . Rofecoxib Anaphylaxis  . Sulfa Antibiotics Hives, Itching, Anaphylaxis, Nausea And Vomiting and Other (See Comments)  . Tolmetin Anaphylaxis  . Bactrim [Sulfamethoxazole-Trimethoprim] Hives and Itching  . Biguanide Copolymer Other (See Comments)    Reaction:  Unknown   . Buprenorphine Hcl Other (See Comments)    Reaction:  Unknown   . Cetirizine Other (See Comments)  . Ciprofloxacin Other (See Comments)    Reaction:  Unknown   . Codeine Nausea And Vomiting and  Other (See Comments)    Reaction:  Syncope   . Dextromethorphan Hbr Other (See Comments)    Reaction:  GI upset   . Dextromethorphan Polistirex Er Other (See Comments)    Reaction:  GI upset  GI upset  . Furosemide Other (See Comments)    Reaction:  Unknown   . Hydrocodone Other (See Comments)    Reaction:  Unknown   . Metformin Other (See Comments)    Reaction:  Unknown   . Morphine And Related Other (See Comments)    Reaction:  Unknown   . Penicillins Swelling and Other (See Comments)    Pt did not answer the additional questions for this medication because she does not remember.    . Pregabalin Other (See Comments)    Reaction:  Unknown   . Promethazine Other (See Comments)    Reaction:  Unknown   . Propoxyphene Other (See Comments)    Reaction:  Unknown   . Propoxyphene Other (See Comments)  . Quinolones Other (See Comments)    Reaction:  Unknown   . Seroquel [Quetiapine Fumarate] Other (See Comments)    Reaction:  Unknown   . Serotonin Reuptake Inhibitors (Ssris) Other (See Comments)    Reaction:  Unknown   . Tramadol Nausea Only  . Ultracet [Tramadol-Acetaminophen] Other (See Comments)    Reaction:  Unknown     Family History  Problem Relation Age of Onset  . Heart disease Paternal Grandmother   . Diabetes Paternal Grandmother   . Heart disease Father   . Heart disease Mother   . Heart disease Maternal Grandmother   . Stroke Maternal Aunt   . Breast cancer Paternal Aunt 94  . Lung cancer Brother 73  . Kidney disease Cousin   . Bladder Cancer Neg Hx      Prior to Admission medications   Medication Sig Start Date End Date Taking? Authorizing Provider  acetaminophen (TYLENOL) 650 MG CR tablet Take 650 mg by mouth every morning.    [provider]  ALPRAZolam Duanne Moron) 0.5 MG tablet Take 1 tablet (0.5 mg total) by mouth 3 (three) times daily as needed for anxiety. Patient taking differently: Take 0.5 mg by mouth 3 (three) times daily as needed for  anxiety. Take 1 tablet (0.5MG ) by mouth twice daily - may take a third tablet daily if needed for anxiety 03/22/15   Adin Hector, MD  AMBULATORY NON FORMULARY MEDICATION Medication Name: incentive spirometry use 10-15 times daily. Dx:J47.9 10/14/16   Flora Lipps, MD  budesonide (PULMICORT) 0.5 MG/2ML nebulizer solution SMARTSIG:1 Vial(s) Via Nebulizer Every Evening 01/04/20   [provider]  calcium carbonate (TUMS - DOSED IN MG ELEMENTAL CALCIUM) 500 MG chewable tablet Chew 1 tablet by mouth as needed for indigestion or heartburn.    [provider]  clindamycin (  CLEOCIN) 300 MG capsule TAKE 2 CAPSULES BY MOUTH 1 HOUR BEFORE APPOINTMENT 09/21/19   [provider]  clopidogrel (PLAVIX) 75 MG tablet Take 1 tablet (75 mg total) by mouth daily. 10/16/16   Fritzi Mandes, MD  COVID-19 mRNA vaccine, Pfizer, 30 MCG/0.3ML injection USE AS DIRECTED 05/30/20 05/30/21  Carlyle Basques, MD  insulin lispro protamine-lispro (HUMALOG 75/25 MIX) (75-25) 100 UNIT/ML SUSP injection Inject 15 Units into the skin 2 (two) times daily. Patient taking differently: Inject 10 Units into the skin 2 (two) times daily. Inject up to 15u under the skin twice daily 03/22/15   Tama High III, MD  KLOR-CON M20 20 MEQ tablet Take 20 mEq by mouth every other day.     [provider]  losartan (COZAAR) 25 MG tablet Take 25 mg by mouth daily.    [provider]  Magnesium Hydroxide (MILK OF MAGNESIA PO) Take by mouth as needed.    [provider]  Multiple Vitamin (MULTIVITAMIN WITH MINERALS) TABS tablet Take 1 tablet by mouth every other day.    [provider]  mupirocin ointment (BACTROBAN) 2 %  01/05/20   [provider]  simvastatin (ZOCOR) 20 MG tablet Take 20 mg by mouth daily.    [provider]  torsemide (DEMADEX) 20 MG tablet Take 20 mg by mouth every other day.     [provider]  triamcinolone cream (KENALOG) 0.1 % Apply topically 2  (two) times daily. 11/04/19   [provider]    Physical Exam: Vitals:   10/29/2020 1930 10/26/2020 2000 10/24/2020 2030 11/18/2020 2100  BP: (!) 150/52 (!) 151/49 (!) 153/55 (!) 134/42  Pulse: 79 92 94 76  Resp: 18 (!) 29 (!) 29 (!) 24  Temp:      TempSrc:      SpO2: 96% 92% 92% 96%  Weight:      Height:          Constitutional: Morbidly obese, anxious, in obvious respiratory distress Vitals:   10/26/2020 1930 11/10/2020 2000 10/31/2020 2030 11/18/2020 2100  BP: (!) 150/52 (!) 151/49 (!) 153/55 (!) 134/42  Pulse: 79 92 94 76  Resp: 18 (!) 29 (!) 29 (!) 24  Temp:      TempSrc:      SpO2: 96% 92% 92% 96%  Weight:      Height:       Eyes: PERRL, lids and conjunctivae normal ENMT: Mucous membranes are moist. Posterior pharynx clear of any exudate or lesions.Normal dentition.  Neck: normal, supple, no masses, no thyromegaly Respiratory: Tachypneic, now on BiPAP, decreased air entry, some wheeze and crackles bilaterally, use of accessory muscles.  Cardiovascular: Sinus tachycardia no murmurs / rubs / gallops.  3+ extremity edema. 2+ pedal pulses. No carotid bruits.  Abdomen: no tenderness, no masses palpated. No hepatosplenomegaly. Bowel sounds positive.  Musculoskeletal: no clubbing / cyanosis. No joint deformity upper and lower extremities. Good ROM, no contractures. Normal muscle tone.  Skin: no rashes, lesions, ulcers. No induration Neurologic: CN 2-12 grossly intact. Sensation intact, DTR normal. Strength 5/5 in all 4.  Psychiatric: Normal judgment and insight. Alert and oriented x 3.  Anxious mood.     Labs on Admission: I have personally reviewed following labs and imaging studies  CBC: Recent Labs  Lab 11/13/2020 1725  WBC 4.8  NEUTROABS 2.9  HGB 9.4*  HCT 29.9*  MCV 88.7  PLT 270   Basic Metabolic Panel: Recent Labs  Lab 10/25/2020 1725  NA 136  K 5.2*  CL 100  CO2 27  GLUCOSE 210*  BUN 63*  CREATININE 1.40*  CALCIUM 9.1   GFR: Estimated Creatinine  Clearance: 33.7 mL/min (A) (by C-G formula based on SCr of 1.4 mg/dL (H)). Liver Function Tests: No results for input(s): AST, ALT, ALKPHOS, BILITOT, PROT, ALBUMIN in the last 168 hours. No results for input(s): LIPASE, AMYLASE in the last 168 hours. No results for input(s): AMMONIA in the last 168 hours. Coagulation Profile: No results for input(s): INR, PROTIME in the last 168 hours. Cardiac Enzymes: No results for input(s): CKTOTAL, CKMB, CKMBINDEX, TROPONINI in the last 168 hours. BNP (last 3 results) No results for input(s): PROBNP in the last 8760 hours. HbA1C: No results for input(s): HGBA1C in the last 72 hours. CBG: No results for input(s): GLUCAP in the last 168 hours. Lipid Profile: No results for input(s): CHOL, HDL, LDLCALC, TRIG, CHOLHDL, LDLDIRECT in the last 72 hours. Thyroid Function Tests: No results for input(s): TSH, T4TOTAL, FREET4, T3FREE, THYROIDAB in the last 72 hours. Anemia Panel: No results for input(s): VITAMINB12, FOLATE, FERRITIN, TIBC, IRON, RETICCTPCT in the last 72 hours. Urine analysis:    Component Value Date/Time   COLORURINE YELLOW (A) 07/19/2019 1934   APPEARANCEUR Cloudy (A) 04/03/2020 1314   LABSPEC 1.014 07/19/2019 1934   LABSPEC 1.014 11/08/2011 2010   PHURINE 6.0 07/19/2019 1934   GLUCOSEU Negative 04/03/2020 1314   GLUCOSEU Negative 11/08/2011 2010   HGBUR NEGATIVE 07/19/2019 1934   BILIRUBINUR Negative 04/03/2020 1314   BILIRUBINUR Negative 11/08/2011 2010   KETONESUR 5 (A) 07/19/2019 1934   PROTEINUR 1+ (A) 04/03/2020 1314   PROTEINUR 100 (A) 07/19/2019 1934   NITRITE Negative 04/03/2020 1314   NITRITE NEGATIVE 07/19/2019 1934   LEUKOCYTESUR Trace (A) 04/03/2020 1314   LEUKOCYTESUR MODERATE (A) 07/19/2019 1934   LEUKOCYTESUR 3+ 11/08/2011 2010   Sepsis Labs: @LABRCNTIP (procalcitonin:4,lacticidven:4) )No results found for this or any previous visit (from the past 240 hour(s)).   Radiological Exams on Admission: DG Chest 2  View  Result Date: 11/11/2020 CLINICAL DATA:  Shortness of breath EXAM: CHEST - 2 VIEW COMPARISON:  December 07, 2018. FINDINGS: There is no edema or airspace opacity. There is slight atelectasis in the left mid lung. Heart is upper normal in size with pacemaker leads attached to the right atrium and right ventricle. No adenopathy. There is aortic atherosclerosis. There is degenerative change in the thoracic spine. IMPRESSION: Slight left midlung atelectasis. No edema or airspace opacity. Heart upper normal in size with pacemaker leads attached to right atrium and right ventricle. Aortic Atherosclerosis (ICD10-I70.0). Electronically Signed   By: Lowella Grip III M.D.   On: 10/28/2020 17:08    EKG: Independently reviewed.  Shows sinus rhythm with a rate of 86, left bundle branch block which is not new  Assessment/Plan Principal Problem:   Acute on chronic respiratory failure with hypoxia (HCC) Active Problems:   Diabetes mellitus (HCC)   GERD (gastroesophageal reflux disease)   Morbid obesity (HCC)   Benign essential hypertension   Lymphedema   CKD (chronic kidney disease) stage 3, GFR 30-59 ml/min (HCC)   Angina at rest [facility])   CHF (congestive heart failure) ([place])     #1.  Acute on chronic respiratory failure with hypoxia: Secondary to CHF exacerbation in the setting of known coronary artery disease.  Patient will be maintained on aggressive diuresis.  Currently on BiPAP.  Admit to higher level of care.  Cardiology consult in the morning.  #2 unstable angina:  Patient scheduled for outpatient cardduct cardiac stress testing inpatient.  #3 diabetes: Sliding scale insulin  #4 GERD: Continue with PPIs.  #5 benign essential hypertension: Continue with home regimen.  #6 chronic lymphedema: Currently has lymphedema boots on.  Elevate the feet and continue with diuresis.  #7 chronic kidney disease stage III: Appears close to baseline.  Continue close monitoring.  #8 morbid obesity:  Dietary counseling   DVT prophylaxis: Lovenox Code Status: Full code Family Communication: Son at bedside Disposition Plan: To be determined Consults called: Cardiology Admission status: Inpatient  Severity of Illness: The appropriate patient status for this patient is INPATIENT. Inpatient status is judged to be reasonable and necessary in order to provide the required intensity of service to ensure the patient's safety. The patient's presenting symptoms, physical exam findings, and initial radiographic and laboratory data in the context of their chronic comorbidities is felt to place them at high risk for further clinical deterioration. Furthermore, it is not anticipated that the patient will be medically stable for discharge from the hospital within 2 midnights of admission. The following factors support the patient status of inpatient.   " The patient's presenting symptoms include shortness of breath and chest pain. " The worrisome physical exam findings include tachypnea. " The initial radiographic and laboratory data are worrisome because of evidence of CHF bilaterally. " The chronic co-morbidities include coronary artery disease.   * I certify that at the point of admission it is my clinical judgment that the patient will require inpatient hospital care spanning beyond 2 midnights from the point of admission due to high intensity of service, high risk for further deterioration and high frequency of surveillance required.Barbette Merino MD [facility] Pager [phone]  If 7PM-7AM, please contact night-coverage [email or web address] Password [facility]  10/26/2020, 9:52 PM

## 2020-10-27 NOTE — Plan of Care (Signed)
Discussed with patient plan of care for the evening, pain management, admission questions and Bipap with some teach back displayed  Problem: Education: Goal: Knowledge of General Education information will improve Description: Including pain rating scale, medication(s)/side effects and non-pharmacologic comfort measures Outcome: Progressing

## 2020-10-28 ENCOUNTER — Inpatient Hospital Stay: Payer: Medicare Other

## 2020-10-28 DIAGNOSIS — J9621 Acute and chronic respiratory failure with hypoxia: Secondary | ICD-10-CM | POA: Diagnosis not present

## 2020-10-28 LAB — CBC WITH DIFFERENTIAL/PLATELET
Abs Immature Granulocytes: 0.01 10*3/uL (ref 0.00–0.07)
Basophils Absolute: 0 10*3/uL (ref 0.0–0.1)
Basophils Relative: 1 %
Eosinophils Absolute: 0.8 10*3/uL — ABNORMAL HIGH (ref 0.0–0.5)
Eosinophils Relative: 13 %
HCT: 30.1 % — ABNORMAL LOW (ref 36.0–46.0)
Hemoglobin: 9.5 g/dL — ABNORMAL LOW (ref 12.0–15.0)
Immature Granulocytes: 0 %
Lymphocytes Relative: 14 %
Lymphs Abs: 0.8 10*3/uL (ref 0.7–4.0)
MCH: 28.1 pg (ref 26.0–34.0)
MCHC: 31.6 g/dL (ref 30.0–36.0)
MCV: 89.1 fL (ref 80.0–100.0)
Monocytes Absolute: 0.6 10*3/uL (ref 0.1–1.0)
Monocytes Relative: 10 %
Neutro Abs: 3.7 10*3/uL (ref 1.7–7.7)
Neutrophils Relative %: 62 %
Platelets: 155 10*3/uL (ref 150–400)
RBC: 3.38 MIL/uL — ABNORMAL LOW (ref 3.87–5.11)
RDW: 13.6 % (ref 11.5–15.5)
WBC: 5.9 10*3/uL (ref 4.0–10.5)
nRBC: 0 % (ref 0.0–0.2)

## 2020-10-28 LAB — CBC
HCT: 30 % — ABNORMAL LOW (ref 36.0–46.0)
Hemoglobin: 9.3 g/dL — ABNORMAL LOW (ref 12.0–15.0)
MCH: 27.8 pg (ref 26.0–34.0)
MCHC: 31 g/dL (ref 30.0–36.0)
MCV: 89.6 fL (ref 80.0–100.0)
Platelets: 149 10*3/uL — ABNORMAL LOW (ref 150–400)
RBC: 3.35 MIL/uL — ABNORMAL LOW (ref 3.87–5.11)
RDW: 13.3 % (ref 11.5–15.5)
WBC: 4.8 10*3/uL (ref 4.0–10.5)
nRBC: 0 % (ref 0.0–0.2)

## 2020-10-28 LAB — TROPONIN I (HIGH SENSITIVITY)
Troponin I (High Sensitivity): 64 ng/L — ABNORMAL HIGH (ref <18)
Troponin I (High Sensitivity): 115 ng/L (ref <18)
Troponin I (High Sensitivity): 247 ng/L (ref <18)
Troponin I (High Sensitivity): 348 ng/L (ref <18)
Troponin I (High Sensitivity): 378 ng/L (ref <18)

## 2020-10-28 LAB — CREATININE, SERUM
Creatinine, Ser: 1.39 mg/dL — ABNORMAL HIGH (ref 0.44–1.00)
GFR, Estimated: 37 mL/min — ABNORMAL LOW (ref >60)

## 2020-10-28 LAB — HEMOGLOBIN A1C
Hgb A1c MFr Bld: 6 % — ABNORMAL HIGH (ref 4.8–5.6)
Mean Plasma Glucose: 125.5 mg/dL

## 2020-10-28 LAB — BRAIN NATRIURETIC PEPTIDE: B Natriuretic Peptide: 952.4 pg/mL — ABNORMAL HIGH (ref 0.0–100.0)

## 2020-10-28 LAB — GLUCOSE, CAPILLARY
Glucose-Capillary: 111 mg/dL — ABNORMAL HIGH (ref 70–99)
Glucose-Capillary: 162 mg/dL — ABNORMAL HIGH (ref 70–99)
Glucose-Capillary: 138 mg/dL — ABNORMAL HIGH (ref 70–99)
Glucose-Capillary: 109 mg/dL — ABNORMAL HIGH (ref 70–99)

## 2020-10-28 LAB — LIPID PANEL
Cholesterol: 126 mg/dL (ref 0–200)
HDL: 77 mg/dL (ref >40)
LDL Cholesterol: 46 mg/dL (ref 0–99)
Total CHOL/HDL Ratio: 1.6 RATIO
Triglycerides: 17 mg/dL (ref <150)
VLDL: 3 mg/dL (ref 0–40)

## 2020-10-28 LAB — PROCALCITONIN: Procalcitonin: <0.1 ng/mL

## 2020-10-28 LAB — MRSA PCR SCREENING: MRSA by PCR: NEGATIVE

## 2020-10-28 LAB — SARS CORONAVIRUS 2 (TAT 6-24 HRS): SARS Coronavirus 2: NEGATIVE

## 2020-10-28 MED ORDER — LIDOCAINE 5 % EX PTCH
1.0000 | MEDICATED_PATCH | CUTANEOUS | Status: DC
  Administered 2020-10-31 – 2020-11-15 (×5): 1 via TRANSDERMAL
  Filled 2020-10-28 (×27): qty 1

## 2020-10-28 MED ORDER — INSULIN ASPART 100 UNIT/ML IJ SOLN: 0.0000 [IU] | Freq: Every day | INTRAMUSCULAR | Status: DC

## 2020-10-28 MED ORDER — SODIUM CHLORIDE 0.9% FLUSH
3.0000 mL | Freq: Two times a day (BID) | INTRAVENOUS | Status: DC
  Administered 2020-10-28 – 2020-11-17 (×41): 3 mL via INTRAVENOUS

## 2020-10-28 MED ORDER — INSULIN ASPART 100 UNIT/ML IJ SOLN: 0.0000 [IU] | Freq: Three times a day (TID) | INTRAMUSCULAR | Status: DC

## 2020-10-28 MED ORDER — INSULIN ASPART 100 UNIT/ML IJ SOLN
0.0000 [IU] | Freq: Three times a day (TID) | INTRAMUSCULAR | Status: DC
  Administered 2020-10-28: 2 [IU] via SUBCUTANEOUS
  Administered 2020-10-28 – 2020-10-29 (×3): 3 [IU] via SUBCUTANEOUS
  Administered 2020-10-30: 5 [IU] via SUBCUTANEOUS
  Filled 2020-10-28 (×5): qty 1

## 2020-10-28 MED ORDER — ACETAMINOPHEN 325 MG PO TABS
650.0000 mg | ORAL_TABLET | ORAL | Status: DC | PRN
  Administered 2020-11-03: 650 mg via ORAL
  Filled 2020-10-28: qty 2

## 2020-10-28 MED ORDER — FUROSEMIDE 10 MG/ML IJ SOLN
40.0000 mg | Freq: Two times a day (BID) | INTRAMUSCULAR | Status: DC
  Administered 2020-10-28 (×2): 40 mg via INTRAVENOUS
  Filled 2020-10-28 (×2): qty 4

## 2020-10-28 MED ORDER — ONDANSETRON HCL 4 MG/2ML IJ SOLN: 4.0000 mg | Freq: Four times a day (QID) | INTRAMUSCULAR | Status: DC | PRN

## 2020-10-28 MED ORDER — CAPSAICIN 0.025 % EX CREA
TOPICAL_CREAM | Freq: Two times a day (BID) | CUTANEOUS | Status: DC
  Administered 2020-11-14: 1 via TOPICAL
  Filled 2020-10-28 (×3): qty 60

## 2020-10-28 MED ORDER — IOHEXOL 350 MG/ML SOLN
75.0000 mL | Freq: Once | INTRAVENOUS | Status: AC | PRN
  Administered 2020-10-28: 75 mL via INTRAVENOUS

## 2020-10-28 MED ORDER — ENOXAPARIN SODIUM 60 MG/0.6ML IJ SOSY
0.5000 mg/kg | PREFILLED_SYRINGE | INTRAMUSCULAR | Status: DC
  Administered 2020-10-28 – 2020-10-29 (×2): 57.5 mg via SUBCUTANEOUS
  Filled 2020-10-28 (×2): qty 0.57

## 2020-10-28 NOTE — Progress Notes (Signed)
Patient received from ICU. Patient is in no apparent distress. Vital signs are stable and patient has no concerns at this time.

## 2020-10-28 NOTE — Progress Notes (Signed)
PROGRESS NOTE    Teresa Krueger  KGM:[id number] DOB: 1933-09-10 DOA: 11/10/2020 PCP: Adin Hector, MD  Brief Narrative:   87 y.o. female with medical history significant of coronary artery disease, chronic lymphedema, COPD, chronic kidney disease stage III, hyperlipidemia, hypertension, diabetes and morbid obesity who was being worked up for angina at home.  Patient was supposed to have a cardiac stress test this week.  She started having chest pressure today shortness of breath.  The shortness of breath has been progressive over the last week.  Currently present even at rest without exertion.  She has pain that is been going on in the left side going to the neck and the back.  It is more pressure that is worse with exertion.  Patient also has noted progressive worsening of leg swelling.  She has cough and frothy sputum.  No fever or chills.  No sick contact.  Patient uses oxygen only at night at about 2 L.  Patient also recently was set up with a Holter monitor which she completed but the results are still pending.  She came to the ER where she was found to be hypoxic evidence of fluid overload as well as anxiety.  Patient progressively got worse in the ER and now is on BiPAP.  She is hypoxic and appears to have evidence of anasarca.  She has been admitted to the hospital for angina with acute on chronic respiratory failure with hypoxia.  5/7: Patient weaned from BiPAP.  Now on 4 L supplemental oxygen saturating 100%.  Still anasarca ache with evidence of decompensated failure.  Cardiology, Dr. Ubaldo Glassing consulted.   Assessment & Plan:   Principal Problem:   Acute on chronic respiratory failure with hypoxia (HCC) Active Problems:   Diabetes mellitus (HCC)   GERD (gastroesophageal reflux disease)   Morbid obesity (HCC)   Benign essential hypertension   Lymphedema   CKD (chronic kidney disease) stage 3, GFR 30-59 ml/min (HCC)   Angina at rest [facility])   CHF (congestive heart failure)  (HCC)  Acute decompensated diastolic congestive heart failure Acute hypoxic respiratory failure secondary to above Patient is endorsing shortness of breath over the past week Was unable to go to her scheduled for stress test Required BiPAP in ED now weaned off Plan: Cardiology consulted, case discussed with Dr. Ubaldo Glassing Lasix 40 mg IV twice daily Target net -1-1.5 L daily Strict I's and O's Daily weights Heart healthy diet Monitor and replace electrolytes.K>4; Mg>2 Follow any further cardiac recommendations  Chest pain Elevated troponin Could be a result of decompensated failure No ischemic changes on EKG Plan: Telemetry monitoring Trend troponins Per cardiology patient may be a candidate for cardiac catheterization early next week  Insulin-dependent diabetes mellitus Well controlled at home Plan: Insulin 70/30 15 units twice daily per home dose Sliding scale coverage  Hypertension Hyperlipidemia Continue home Cozaar Continue home statin Lasix as above  Coronary artery disease Continue antiplatelet therapy  GERD PPI   AKI on CKD stage IIIa Cautious while on diuretics Daily BMP     DVT prophylaxis: SQ Lovenox Code Status: Full Family Communication: Daughter-in-law Freda Munro [phone] Disposition Plan: Status is: Inpatient  Remains inpatient appropriate because:Inpatient level of care appropriate due to severity of illness   Dispo: The patient is from: Home              Anticipated d/c is to: Home              Patient currently is not medically  stable to d/c.   Difficult to place patient No   Acute decompensated heart failure.  Disposition plan pending.    Level of care: Progressive Cardiac  Consultants:   Cardiology- [place] [facility]   Procedures: None  Antimicrobials:   None    Subjective: Seen and examined.  Complains of neck and knee pain.  Breathing improved  Objective: Vitals:   10/28/20 0845 10/28/20 0900 10/28/20 1000 10/28/20  1100  BP:  (!) 145/50 (!) 129/44 (!) 118/37  Pulse:  81 66 (!) 59  Resp:  (!) 22 17 16   Temp:      TempSrc:      SpO2: 96% 99% 100% 99%  Weight:      Height:        Intake/Output Summary (Last 24 hours) at 10/28/2020 1323 Last data filed at 10/28/2020 0600 Gross per 24 hour  Intake 240 ml  Output 450 ml  Net -210 ml   Filed Weights   11/10/2020 1622 11/16/2020 2314 10/28/20 0500  Weight: 117 kg 122.7 kg 121.7 kg    Examination:  General exam: No acute distress Respiratory system: Bibasilar crackles.  Normal work of breathing.  4 L Cardiovascular system: S1-S2, regular rate and rhythm, no murmurs, 2+ pitting edema bilaterally Gastrointestinal system: Obese, nontender, nondistended, normal bowel sounds  Central nervous system: Alert and oriented. No focal neurological deficits. Extremities: Decreased range of motion bilateral lower extremities Skin: Bilateral lower extremity lymphedema Psychiatry: Judgement and insight appear normal. Mood & affect appropriate.     Data Reviewed: I have personally reviewed following labs and imaging studies  CBC: Recent Labs  Lab 11/05/2020 1725 10/28/20 0015  WBC 4.8 4.8  NEUTROABS 2.9  --   HGB 9.4* 9.3*  HCT 29.9* 30.0*  MCV 88.7 89.6  PLT 161 742*   Basic Metabolic Panel: Recent Labs  Lab 11/13/2020 1725 10/28/20 0015  NA 136  --   K 5.2*  --   CL 100  --   CO2 27  --   GLUCOSE 210*  --   BUN 63*  --   CREATININE 1.40* 1.39*  CALCIUM 9.1  --    GFR: Estimated Creatinine Clearance: 34.8 mL/min (A) (by C-G formula based on SCr of 1.39 mg/dL (H)). Liver Function Tests: No results for input(s): AST, ALT, ALKPHOS, BILITOT, PROT, ALBUMIN in the last 168 hours. No results for input(s): LIPASE, AMYLASE in the last 168 hours. No results for input(s): AMMONIA in the last 168 hours. Coagulation Profile: No results for input(s): INR, PROTIME in the last 168 hours. Cardiac Enzymes: No results for input(s): CKTOTAL, CKMB, CKMBINDEX,  TROPONINI in the last 168 hours. BNP (last 3 results) No results for input(s): PROBNP in the last 8760 hours. HbA1C: No results for input(s): HGBA1C in the last 72 hours. CBG: Recent Labs  Lab 11/06/2020 2241 10/28/20 0724 10/28/20 1121  GLUCAP 183* 111* 162*   Lipid Profile: Recent Labs    10/28/20 0015  CHOL 126  HDL 77  LDLCALC 46  TRIG 17  CHOLHDL 1.6   Thyroid Function Tests: No results for input(s): TSH, T4TOTAL, FREET4, T3FREE, THYROIDAB in the last 72 hours. Anemia Panel: No results for input(s): VITAMINB12, FOLATE, FERRITIN, TIBC, IRON, RETICCTPCT in the last 72 hours. Sepsis Labs: Recent Labs  Lab 10/28/20 0532  PROCALCITON <0.10    Recent Results (from the past 240 hour(s))  SARS CORONAVIRUS 2 (TAT 6-24 HRS) Nasopharyngeal Nasopharyngeal Swab     Status: None   Collection Time: 10/26/2020  5:27 PM   Specimen: Nasopharyngeal Swab  Result Value Ref Range Status   SARS Coronavirus 2 NEGATIVE NEGATIVE Final    Comment: (NOTE) SARS-CoV-2 target nucleic acids are NOT DETECTED.  The SARS-CoV-2 RNA is generally detectable in upper and lower respiratory specimens during the acute phase of infection. Negative results do not preclude SARS-CoV-2 infection, do not rule out co-infections with other pathogens, and should not be used as the sole basis for treatment or other patient management decisions. Negative results must be combined with clinical observations, patient history, and epidemiological information. The expected result is Negative.  Fact Sheet for Patients: SugarRoll.be  Fact Sheet for Healthcare Providers: https://www.woods-mathews.com/  This test is not yet approved or cleared by the [place] FDA and  has been authorized for detection and/or diagnosis of SARS-CoV-2 by FDA under an Emergency Use Authorization (EUA). This EUA will remain  in effect (meaning this test can be used) for the duration of  the COVID-19 declaration under Se ction 564(b)(1) of the Act, 21 U.S.C. section 360bbb-3(b)(1), unless the authorization is terminated or revoked sooner.  Performed at [facility], [place] [address], [place], [place] [address]   MRSA PCR Screening     Status: None   Collection Time: 11/05/2020 11:21 PM   Specimen: Nasopharyngeal  Result Value Ref Range Status   MRSA by PCR NEGATIVE NEGATIVE Final    Comment:        The GeneXpert MRSA Assay (FDA approved for NASAL specimens only), is one component of a comprehensive MRSA colonization surveillance program. It is not intended to diagnose MRSA infection nor to guide or monitor treatment for MRSA infections. Performed at [facility], [address], [place], [place] [address]          Radiology Studies: DG Chest 2 View  Result Date: 11/09/2020 CLINICAL DATA:  Shortness of breath EXAM: CHEST - 2 VIEW COMPARISON:  December 07, 2018. FINDINGS: There is no edema or airspace opacity. There is slight atelectasis in the left mid lung. Heart is upper normal in size with pacemaker leads attached to the right atrium and right ventricle. No adenopathy. There is aortic atherosclerosis. There is degenerative change in the thoracic spine. IMPRESSION: Slight left midlung atelectasis. No edema or airspace opacity. Heart upper normal in size with pacemaker leads attached to right atrium and right ventricle. Aortic Atherosclerosis (ICD10-I70.0). Electronically Signed   By: Lowella Grip III M.D.   On: 11/15/2020 17:08   CT ANGIO CHEST PE W OR WO CONTRAST  Result Date: 10/28/2020 CLINICAL DATA:  87 year old female with concern for pulmonary embolism. EXAM: CT ANGIOGRAPHY CHEST WITH CONTRAST TECHNIQUE: Multidetector CT imaging of the chest was performed using the standard protocol during bolus administration of intravenous contrast. Multiplanar CT image reconstructions and MIPs were obtained to evaluate the vascular anatomy. CONTRAST:  52mL  OMNIPAQUE IOHEXOL 350 MG/ML SOLN COMPARISON:  Chest CT dated 02/07/2016. FINDINGS: Cardiovascular: Mild cardiomegaly. No pericardial effusion. There is coronary vascular calcification and calcification of the mitral annulus. Right pectoral pacemaker device. Advanced atherosclerotic calcification of the thoracic aorta. No aneurysmal dilatation. Evaluation of the pulmonary arteries is limited due to respiratory motion artifact. No large or central pulmonary artery embolus identified. Mediastinum/Nodes: No hilar or mediastinal adenopathy. The esophagus is grossly unremarkable. No mediastinal fluid collection. Lungs/Pleura: There are small bilateral pleural effusions with bibasilar atelectasis. Diffuse interstitial and interlobular septal prominence and patchy bilateral ground-glass opacities consistent with vascular congestion and edema. Scattered ground-glass nodularity may represent edema  or pneumonia. Clinical correlation is recommended. No pneumothorax. The central airways are patent. Upper Abdomen: There is a 15 mm hypodense lesion in the right lobe of the liver. Cholecystectomy. Indeterminate subcentimeter left renal hypodense lesion. Musculoskeletal: Osteopenia with scoliosis and degenerative changes of the spine. No acute osseous pathology. Review of the MIP images confirms the above findings. IMPRESSION: 1. No CT evidence of central pulmonary artery embolus. 2. Mild cardiomegaly with findings of CHF and small bilateral pleural effusions. Scattered ground-glass nodularity may represent edema or pneumonia. Clinical correlation is recommended. 3. Aortic Atherosclerosis (ICD10-I70.0). Electronically Signed   By: Anner Crete M.D.   On: 10/28/2020 01:14        Scheduled Meds: . acetaminophen  650 mg Oral BH-q7a  . ALPRAZolam  0.5 mg Oral BID  . budesonide  0.5 mg Nebulization Daily  . capsaicin   Topical BID  . chlorhexidine  15 mL Mouth Rinse BID  . Chlorhexidine Gluconate Cloth  6 each Topical  Q0600  . clopidogrel  75 mg Oral Daily  . enoxaparin (LOVENOX) injection  0.5 mg/kg Subcutaneous Q24H  . furosemide  40 mg Intravenous Q12H  . insulin aspart  0-15 Units Subcutaneous TID AC & HS  . insulin aspart protamine- aspart  15 Units Subcutaneous BID WC  . lidocaine  1 patch Transdermal Q24H  . losartan  25 mg Oral Daily  . mouth rinse  15 mL Mouth Rinse q12n4p  . multivitamin with minerals  1 tablet Oral QODAY  . mupirocin ointment   Topical BID  . pantoprazole  40 mg Oral Daily  . potassium chloride SA  20 mEq Oral Daily  . simvastatin  20 mg Oral q1600  . sodium chloride flush  3 mL Intravenous Q12H  . triamcinolone cream   Topical BID   Continuous Infusions:   LOS: 1 day    Time spent: 25 minutes    Sidney Ace, MD [facility] Pager [phone]  If 7PM-7AM, please contact night-coverage 10/28/2020, 1:23 PM

## 2020-10-28 NOTE — Consult Note (Addendum)
Cardiology Consultation Note    Patient ID: Teresa Krueger, MRN: [id number], DOB/AGE: Aug 02, 1933 87 y.o. Admit date: 10/23/2020   Date of Consult: 10/28/2020 Primary Physician: Adin Hector, MD Primary Cardiologist: paraschos  Chief Complaint: sob/chest pain Reason for Consultation: [place] Requesting MD: Dr. Priscella Mann  HPI: Teresa Krueger is a 87 y.o. female with history of lower extremity lymphedema, history of heart block status post permanent pacemaker, COPD, diabetes, hyperlipidemia, hypertension who presented to the emergency room with increasing shortness of breath.  She was scheduled for a outpatient Myoview for next week however the patient canceled it thinking that her lungs would preclude her being able to do this.  She has chest pain with both typical atypical features.  She states that it radiates from her chest to her back.  This appears to be present both at rest and with exertion but she does state that on occasion it worsens with exertion.  She denies syncope or presyncope.  She is compliant with her medications.  She states she had cardiac catheterizations at least twice in the past 1 at [facility] of 1 at [facility].  Cath at [facility] was done in 1988 showing normal coronary arteries.  Most recent echo done in December 2021 showed preserved LV function with mild aortic stenosis with a valve area of 1.0 cm by continuity equation.  She is resting comfortably at present.  Her troponin has peaked at 247.  There is another 1 pending.  BNP is 952.  6 months ago was 384.  EKG showed sinus rhythm with ventricular paced rhythm.  Chest x-ray revealed slight left midlung atelectasis.  No evidence of congestive heart failure.  Pacemaker present.  Chest CT revealed no pulmonary embolus.  There was mild cardiomegaly with possible mild congestive heart failure.  Past Medical History:  Diagnosis Date  . Arthritis   . Asthma   . Barrett's esophagus   . Breast cancer ([place]) 1999    LT MASTECTOMY  . Bronchiectasis ([place])   . COPD (chronic obstructive pulmonary disease) ([place])   . Diabetes mellitus ([place])   . Hypercholesterolemia   . Hypertension   . Lymphedema   . Mini stroke ([place])   . Osteoporosis   . Pacemaker       Surgical History:  Past Surgical History:  Procedure Laterality Date  . CARPAL TUNNEL RELEASE     right  . CATARACT EXTRACTION     bilateral  . GALLBLADDER SURGERY    . MASTECTOMY Left 1999   BREAST CA  . PACEMAKER INSERTION Right 03/21/2015   Procedure: INSERTION PACEMAKER;  Surgeon: Isaias Cowman, MD;  Location: [facility] ORS;  Service: Cardiovascular;  Laterality: Right;  . TOTAL HIP ARTHROPLASTY  2001   left  . TRIGGER FINGER RELEASE     bilateral     Home Meds: Prior to Admission medications   Medication Sig Start Date End Date Taking? Authorizing Provider  acetaminophen (TYLENOL) 650 MG CR tablet Take 650 mg by mouth every morning.   Yes [provider]  ALPRAZolam (XANAX) 0.5 MG tablet Take 1 tablet (0.5 mg total) by mouth 3 (three) times daily as needed for anxiety. Patient taking differently: Take 0.5 mg by mouth 3 (three) times daily as needed for anxiety. Take 1 tablet (0.5MG ) by mouth twice daily - may take a third tablet daily if needed for anxiety 03/22/15  Yes Adin Hector, MD  AMBULATORY NON FORMULARY MEDICATION Medication Name: incentive spirometry use  10-15 times daily. Dx:J47.9 10/14/16  Yes Kasa, Maretta Bees, MD  budesonide (PULMICORT) 0.5 MG/2ML nebulizer solution SMARTSIG:1 Vial(s) Via Nebulizer Every Evening 01/04/20  Yes [provider]  calcium carbonate (TUMS - DOSED IN MG ELEMENTAL CALCIUM) 500 MG chewable tablet Chew 1 tablet by mouth as needed for indigestion or heartburn.   Yes [provider]  clopidogrel (PLAVIX) 75 MG tablet Take 1 tablet (75 mg total) by mouth daily. 10/16/16  Yes Fritzi Mandes, MD  insulin lispro protamine-lispro (HUMALOG 75/25 MIX) (75-25) 100 UNIT/ML SUSP injection  Inject 15 Units into the skin 2 (two) times daily. Patient taking differently: Inject 10 Units into the skin 2 (two) times daily. Inject up to 15u under the skin twice daily 03/22/15  Yes Tama High III, MD  KLOR-CON M20 20 MEQ tablet Take 20 mEq by mouth daily.   Yes [provider]  losartan (COZAAR) 25 MG tablet Take 25 mg by mouth daily.   Yes [provider]  Magnesium Hydroxide (MILK OF MAGNESIA PO) Take by mouth as needed.   Yes [provider]  Multiple Vitamin (MULTIVITAMIN WITH MINERALS) TABS tablet Take 1 tablet by mouth every other day.   Yes [provider]  mupirocin ointment (BACTROBAN) 2 %  01/05/20  Yes [provider]  pantoprazole (PROTONIX) 40 MG tablet Take 1 tablet by mouth daily. 10/18/20  Yes [provider]  simvastatin (ZOCOR) 20 MG tablet Take 20 mg by mouth daily.   Yes [provider]  torsemide (DEMADEX) 20 MG tablet Take 20 mg by mouth daily.   Yes [provider]  triamcinolone cream (KENALOG) 0.1 % Apply topically 2 (two) times daily. 11/04/19  Yes [provider]  clindamycin (CLEOCIN) 300 MG capsule TAKE 2 CAPSULES BY MOUTH 1 HOUR BEFORE APPOINTMENT Patient not taking: No sig reported 09/21/19   [provider]  COVID-19 mRNA vaccine, Pfizer, 30 MCG/0.3ML injection USE AS DIRECTED Patient not taking: Reported on 10/31/2020 05/30/20 05/30/21  Carlyle Basques, MD    Inpatient Medications:  . acetaminophen  650 mg Oral BH-q7a  . ALPRAZolam  0.5 mg Oral BID  . budesonide  0.5 mg Nebulization Daily  . chlorhexidine  15 mL Mouth Rinse BID  . Chlorhexidine Gluconate Cloth  6 each Topical Q0600  . clopidogrel  75 mg Oral Daily  . enoxaparin (LOVENOX) injection  0.5 mg/kg Subcutaneous Q24H  . furosemide  40 mg Intravenous Q12H  . insulin aspart  0-15 Units Subcutaneous TID AC & HS  . insulin aspart protamine- aspart  15 Units Subcutaneous BID WC  . lidocaine  1 patch Transdermal  Q24H  . losartan  25 mg Oral Daily  . mouth rinse  15 mL Mouth Rinse q12n4p  . multivitamin with minerals  1 tablet Oral QODAY  . mupirocin ointment   Topical BID  . pantoprazole  40 mg Oral Daily  . potassium chloride SA  20 mEq Oral Daily  . simvastatin  20 mg Oral q1600  . sodium chloride flush  3 mL Intravenous Q12H  . triamcinolone cream   Topical BID     Allergies:  Allergies  Allergen Reactions  . Aspirin Swelling and Other (See Comments)    Pt states that she has tongue swelling.    . Celebrex [Celecoxib] Anaphylaxis  . Meloxicam Swelling  . Nsaids Anaphylaxis, Other (See Comments) and Nausea And Vomiting    unknown   . Quetiapine Anaphylaxis    intolerant  . Rofecoxib Anaphylaxis  .  Sulfa Antibiotics Hives, Itching, Anaphylaxis, Nausea And Vomiting and Other (See Comments)  . Tolmetin Anaphylaxis  . Bactrim [Sulfamethoxazole-Trimethoprim] Hives and Itching  . Biguanide Copolymer Other (See Comments)    Reaction:  Unknown   . Buprenorphine Hcl Other (See Comments)    Reaction:  Unknown   . Cetirizine Other (See Comments)  . Ciprofloxacin Other (See Comments)    Reaction:  Unknown   . Codeine Nausea And Vomiting and Other (See Comments)    Reaction:  Syncope   . Dextromethorphan Hbr Other (See Comments)    Reaction:  GI upset   . Dextromethorphan Polistirex Er Other (See Comments)    Reaction:  GI upset  GI upset  . Furosemide Other (See Comments)    Reaction:  Unknown   . Hydrocodone Other (See Comments)    Reaction:  Unknown   . Metformin Other (See Comments)    Reaction:  Unknown   . Morphine And Related Other (See Comments)    Reaction:  Unknown   . Penicillins Swelling and Other (See Comments)    Pt did not answer the additional questions for this medication because she does not remember.    . Pregabalin Other (See Comments)    Reaction:  Unknown   . Promethazine Other (See Comments)    Reaction:  Unknown   . Propoxyphene Other (See Comments)     Reaction:  Unknown   . Propoxyphene Other (See Comments)  . Quinolones Other (See Comments)    Reaction:  Unknown   . Seroquel [Quetiapine Fumarate] Other (See Comments)    Reaction:  Unknown   . Serotonin Reuptake Inhibitors (Ssris) Other (See Comments)    Reaction:  Unknown   . Tramadol Nausea Only  . Ultracet [Tramadol-Acetaminophen] Other (See Comments)    Reaction:  Unknown     Social History   Socioeconomic History  . Marital status: Widowed    Spouse name: Not on file  . Number of children: 1  . Years of education: Not on file  . Highest education level: Not on file  Occupational History  . 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 Smoking status: Former Smoker    Packs/day: 1.00    Years: 30.00    Pack years: 30.00    Types: Cigarettes    Quit date: 12/27/1992    Years since quitting: 27.8  . Smokeless tobacco: Never Used  Substance and Sexual Activity  . Alcohol use: No  . Drug use: No  . Sexual activity: Never  Other Topics Concern  . Not on file  Social History Narrative  . Not on file   Social Determinants of Health   Financial Resource Strain: Not on file  Food Insecurity: Not on file  Transportation Needs: Not on file  Physical Activity: Not on file  Stress: Not on file  Social Connections: Not on file  Intimate Partner Violence: Not on file     Family History  Problem Relation Age of Onset  . Heart disease Paternal Grandmother   . Diabetes Paternal Grandmother   . Heart disease Father   . Heart disease Mother   . Heart disease Maternal Grandmother   . Stroke Maternal Aunt   . Breast cancer Paternal Aunt 94  . Lung cancer Brother 83  . Kidney disease Cousin   . Bladder Cancer Neg Hx      Review of Systems: A 12-system review of systems was  performed and is negative except as noted in the HPI.  Labs: No results for input(s): CKTOTAL, CKMB, TROPONINI in the last 72 hours. Lab Results  Component Value  Date   WBC 4.8 10/28/2020   HGB 9.3 (L) 10/28/2020   HCT 30.0 (L) 10/28/2020   MCV 89.6 10/28/2020   PLT 149 (L) 10/28/2020    Recent Labs  Lab 11/11/2020 1725 10/28/20 0015  NA 136  --   K 5.2*  --   CL 100  --   CO2 27  --   BUN 63*  --   CREATININE 1.40* 1.39*  CALCIUM 9.1  --   GLUCOSE 210*  --    Lab Results  Component Value Date   CHOL 126 10/28/2020   HDL 77 10/28/2020   LDLCALC 46 10/28/2020   TRIG 17 10/28/2020   Lab Results  Component Value Date   DDIMER 1.05 (H) 11/20/2020    Radiology/Studies:  DG Chest 2 View  Result Date: 11/19/2020 CLINICAL DATA:  Shortness of breath EXAM: CHEST - 2 VIEW COMPARISON:  December 07, 2018. FINDINGS: There is no edema or airspace opacity. There is slight atelectasis in the left mid lung. Heart is upper normal in size with pacemaker leads attached to the right atrium and right ventricle. No adenopathy. There is aortic atherosclerosis. There is degenerative change in the thoracic spine. IMPRESSION: Slight left midlung atelectasis. No edema or airspace opacity. Heart upper normal in size with pacemaker leads attached to right atrium and right ventricle. Aortic Atherosclerosis (ICD10-I70.0). Electronically Signed   By: Lowella Grip III M.D.   On: 11/14/2020 17:08   CT ANGIO CHEST PE W OR WO CONTRAST  Result Date: 10/28/2020 CLINICAL DATA:  87 year old female with concern for pulmonary embolism. EXAM: CT ANGIOGRAPHY CHEST WITH CONTRAST TECHNIQUE: Multidetector CT imaging of the chest was performed using the standard protocol during bolus administration of intravenous contrast. Multiplanar CT image reconstructions and MIPs were obtained to evaluate the vascular anatomy. CONTRAST:  75mL OMNIPAQUE IOHEXOL 350 MG/ML SOLN COMPARISON:  Chest CT dated 02/07/2016. FINDINGS: Cardiovascular: Mild cardiomegaly. No pericardial effusion. There is coronary vascular calcification and calcification of the mitral annulus. Right pectoral pacemaker device.  Advanced atherosclerotic calcification of the thoracic aorta. No aneurysmal dilatation. Evaluation of the pulmonary arteries is limited due to respiratory motion artifact. No large or central pulmonary artery embolus identified. Mediastinum/Nodes: No hilar or mediastinal adenopathy. The esophagus is grossly unremarkable. No mediastinal fluid collection. Lungs/Pleura: There are small bilateral pleural effusions with bibasilar atelectasis. Diffuse interstitial and interlobular septal prominence and patchy bilateral ground-glass opacities consistent with vascular congestion and edema. Scattered ground-glass nodularity may represent edema or pneumonia. Clinical correlation is recommended. No pneumothorax. The central airways are patent. Upper Abdomen: There is a 15 mm hypodense lesion in the right lobe of the liver. Cholecystectomy. Indeterminate subcentimeter left renal hypodense lesion. Musculoskeletal: Osteopenia with scoliosis and degenerative changes of the spine. No acute osseous pathology. Review of the MIP images confirms the above findings. IMPRESSION: 1. No CT evidence of central pulmonary artery embolus. 2. Mild cardiomegaly with findings of CHF and small bilateral pleural effusions. Scattered ground-glass nodularity may represent edema or pneumonia. Clinical correlation is recommended. 3. Aortic Atherosclerosis (ICD10-I70.0). Electronically Signed   By: Anner Crete M.D.   On: 10/28/2020 01:14    Wt Readings from Last 3 Encounters:  10/28/20 121.7 kg  04/03/20 117.9 kg  03/03/20 117.9 kg    EKG: Ventricular paced  Physical Exam:  Blood pressure Marland Kitchen)  124/48, pulse (!) 59, temperature 97.9 F (36.6 C), temperature source Oral, resp. rate 11, height 4\' 11"  (1.499 m), weight 121.7 kg, SpO2 96 %. Body mass index is 54.19 kg/m. General: Well developed, well nourished, in no acute distress. Head: Normocephalic, atraumatic, sclera non-icteric, no xanthomas, nares are without discharge.  Neck:  Negative for carotid bruits. JVD not elevated. Lungs: Clear bilaterally to auscultation without wheezes, rales, or rhonchi. Breathing is unlabored. Heart: RRR with S1 S2. No murmurs, rubs, or gallops appreciated. Abdomen: Soft, non-tender, non-distended with normoactive bowel sounds. No hepatomegaly. No rebound/guarding. No obvious abdominal masses. Msk:  Strength and tone appear normal for age. Extremities: No clubbing or cyanosis.  Pulses not palpable due to marked lymphedema.  No pitting edema noted.. Neuro: Alert and oriented X 3. No facial asymmetry. No focal deficit. Moves all extremities spontaneously. Psych:  Responds to questions appropriately with a normal affect.     Assessment and Plan  87 y.o. female with history of lower extremity lymphedema, history of heart block status post permanent pacemaker, COPD, diabetes, hyperlipidemia, hypertension who presented to the emergency room with increasing shortness of breath.  She was scheduled for a outpatient Myoview for next week however the patient canceled it thinking that her lungs would preclude her being able to do this.  She has chest pain with both typical atypical features.  She states that it radiates from her chest to her back.  This appears to be present both at rest and with exertion but she does state that on occasion it worsens with exertion.  She denies syncope or presyncope.  She is compliant with her medications.  She states she had cardiac catheterizations at least twice in the past 1 at [facility] of 1 at [facility].  Cath at [facility] was done in 1988 showing normal coronary arteries.  Most recent echo done in December 2021 showed preserved LV function with mild aortic stenosis with a valve area of 1.0 cm by continuity equation.  She is resting comfortably at present.  Her troponin has peaked at 247.  There is another 1 pending.  BNP is 952.  6 months ago was 384.  EKG showed sinus rhythm with ventricular paced rhythm.   Chest x-ray revealed slight left midlung atelectasis.  No evidence of congestive heart failure.  Pacemaker present.  Chest CT revealed no pulmonary embolus.  There was mild cardiomegaly with possible mild congestive heart failure.  1.  Chest pain-etiology unclear.  Mildly elevated serum troponins thus far.  This could be secondary to demand due to respiratory failure as well as mild congestive heart failure.  Alternatively, given her symptoms ischemia may be present.  We will review subsequent troponin.  Patient was scheduled for a functional study which has been canceled.  Should her troponin continue to rise consideration for left heart cath to evaluate coronary anatomy.  Patient does not require heparin at present.  EF has been normal.  Unless there is significant change in her clinical course or troponin markedly elevates, I do not think she needs a repeat echocardiogram at present.  Would continue with current medications.  Benefits of cardiac cath were explained.  Okay to transfer to telemetry.  2.  Lymphedema-chronic  3.  Heart block-has a permanent pacemaker which is ventricular pacing.  Functioning normally.  4.  COPD-continue with bronchodilators,  5.  Diabetes-continue with current therapy  6.  Hyperlipidemia-continue with statins.   Signed, Teodoro Spray MD 10/28/2020, 9:25 AM Pager: [phone]

## 2020-10-28 NOTE — Progress Notes (Signed)
PHARMACIST - PHYSICIAN COMMUNICATION  CONCERNING:  Enoxaparin (Lovenox) for DVT Prophylaxis    RECOMMENDATION: Patient was prescribed enoxaprin 40mg  q24 hours for VTE prophylaxis.   Filed Weights   01-Nov-2020 1622 11/20/2020 2314  Weight: 117 kg (257 lb 15 oz) 122.7 kg (270 lb 8.1 oz)    Body mass index is 54.64 kg/m.  Estimated Creatinine Clearance: 34.8 mL/min (A) (by C-G formula based on SCr of 1.4 mg/dL (H)).   Based on [place] patient is candidate for enoxaparin 0.5mg /kg TBW SQ every 24 hours based on BMI being >30.   DESCRIPTION: Pharmacy has adjusted enoxaparin dose per [facility] policy.  Patient is now receiving enoxaparin 0.5 mg/kg every 24 hours    Renda Rolls, PharmD, [facility] 10/28/2020 12:07 AM

## 2020-10-29 DIAGNOSIS — J9621 Acute and chronic respiratory failure with hypoxia: Secondary | ICD-10-CM | POA: Diagnosis not present

## 2020-10-29 LAB — GLUCOSE, CAPILLARY
Glucose-Capillary: 106 mg/dL — ABNORMAL HIGH (ref 70–99)
Glucose-Capillary: 172 mg/dL — ABNORMAL HIGH (ref 70–99)
Glucose-Capillary: 116 mg/dL — ABNORMAL HIGH (ref 70–99)
Glucose-Capillary: 171 mg/dL — ABNORMAL HIGH (ref 70–99)

## 2020-10-29 LAB — BASIC METABOLIC PANEL
Anion gap: 11 (ref 5–15)
BUN: 65 mg/dL — ABNORMAL HIGH (ref 8–23)
CO2: 26 mmol/L (ref 22–32)
Calcium: 8.9 mg/dL (ref 8.9–10.3)
Chloride: 100 mmol/L (ref 98–111)
Creatinine, Ser: 1.47 mg/dL — ABNORMAL HIGH (ref 0.44–1.00)
GFR, Estimated: 35 mL/min — ABNORMAL LOW (ref >60)
Glucose, Bld: 105 mg/dL — ABNORMAL HIGH (ref 70–99)
Potassium: 4.2 mmol/L (ref 3.5–5.1)
Sodium: 137 mmol/L (ref 135–145)

## 2020-10-29 LAB — TROPONIN I (HIGH SENSITIVITY)
Troponin I (High Sensitivity): 338 ng/L (ref <18)
Troponin I (High Sensitivity): 297 ng/L (ref <18)

## 2020-10-29 MED ORDER — FUROSEMIDE 20 MG PO TABS: 20.0000 mg | ORAL_TABLET | Freq: Every day | ORAL | Status: DC

## 2020-10-29 MED ORDER — ENOXAPARIN SODIUM 60 MG/0.6ML IJ SOSY
0.5000 mg/kg | PREFILLED_SYRINGE | INTRAMUSCULAR | Status: DC
  Administered 2020-10-30 – 2020-11-02 (×3): 60 mg via SUBCUTANEOUS
  Filled 2020-10-29 (×4): qty 0.6

## 2020-10-29 NOTE — Progress Notes (Signed)
PT Cancellation Note  Patient Details Name: Teresa Krueger MRN: [id number] DOB: 1934/05/19   Cancelled Treatment:    Reason Eval/Treat Not Completed: Other (comment) PT orders received, chart reviewed. Pt received in bed, providing PLOF & home set up information. Pt then endorses lightheadedness after toileting with nursing staff prior to PT arrival. BP checked in R wrist: 124/25 mmHg (MAP 54), SPO2 100%, HR 68 bpm. Re checked in RUE & BP 116/29 mmHg (MAP 51). Due to pt being symptomatic and low diastolic BP, functional mobility deferred. Pt repositioned in recliner with BLE elevated & reclined back & nurse present in room to assess pt. Will f/u as pt is more appropriate for PT intervention.  Lavone Nian, PT, DPT 10/29/20, 2:16 PM    Waunita Schooner 10/29/2020, 2:14 PM

## 2020-10-29 NOTE — Evaluation (Signed)
Occupational Therapy Evaluation Patient Details Name: Teresa Krueger MRN: [id number] DOB: [birth year] Today's Date: 10/29/2020    History of Present Illness Pt is an 87 y/o F with PMH: atrhritis, ashtma, COPD, CKD3, HLD, HTN, diabetes and morbid obesity who was being worked up for angina at home.  Patient was supposed to have a cardiac stress test this week.  She started having chest pressure and SOB on 5/6 and presented to ED. Pt deing tx'ed for acute on chronic respiratory failure. On 4Lnc nocturnal O2 at home, currently on 4Lnc during waking hours.   Clinical Impression   Pt seen for OT evaluation this date in setting of acute hospitalization d/t SOB. Pt reports being MOD I at baseline with rollator for fxl mobility in the home short distances and reports using power w/c in the community. Pt states she has aide assistance M-F from 9A-1P for meal prep and some assist to transfer into/out of the shower (with seat). Pt presents this date with pain, SOB, decreased strength and general deconditioning impacting her ability to efficiently and safely perform ADLs/ADL mobility. Pt requires CGA for ADL transfers and mobility with 2WW and requires MOD A for LB ADLs. Will continue to follow acutely and anticipate pt will require HHOT f/u as well as intermittent SUPV from family/aide upon d/c to home setting.     Follow Up Recommendations  Home health OT;Supervision - Intermittent    Equipment Recommendations  None recommended by OT (pt has all necessary equipment)    Recommendations for Other Services       Precautions / Restrictions Precautions Precautions: Fall Restrictions Weight Bearing Restrictions: No      Mobility Bed Mobility Overal bed mobility: Needs Assistance Bed Mobility: Supine to Sit     Supine to sit: Min guard;Min assist;HOB elevated          Transfers Overall transfer level: Needs assistance Equipment used: Rolling walker (2 wheeled) Transfers: Sit to/from  [facility] Sit to Stand: Min guard;From elevated surface Stand pivot transfers: Min guard;From elevated surface       General transfer comment: increased time, EOB elevated ~2-3"    Balance Overall balance assessment: Needs assistance   Sitting balance-Leahy Scale: Good     Standing balance support: Bilateral upper extremity supported Standing balance-Leahy Scale: Fair                             ADL either performed or assessed with clinical judgement   ADL Overall ADL's : Needs assistance/impaired                                       General ADL Comments: SETUP for seated UB ADLs, MOD A for seated LB ADLs. CGA for ADL transfers     Vision Baseline Vision/History: Wears glasses Wears Glasses: At all times Patient Visual Report: No change from baseline       Perception     Praxis      Pertinent Vitals/Pain Pain Assessment: Faces Faces Pain Scale: Hurts a little bit Pain Location: LEs Pain Descriptors / Indicators: Aching;Sore Pain Intervention(s): Limited activity within patient's tolerance;Monitored during session;Repositioned     Hand Dominance Right   Extremity/Trunk Assessment Upper Extremity Assessment Upper Extremity Assessment: RUE deficits/detail;LUE deficits/detail RUE Deficits / Details: torn rotator cuff, limited shld ROM at baseline to ~1/2 range LUE Deficits /  Details: ROM WFL, MMT grossly 4-/5   Lower Extremity Assessment Lower Extremity Assessment: Defer to PT evaluation;Generalized weakness   Cervical / Trunk Assessment Cervical / Trunk Assessment: Kyphotic (states she has a curvature of the spine taht was dx when she was 28)   Communication Communication Communication: No difficulties   Cognition Arousal/Alertness: Awake/alert Behavior During Therapy: WFL for tasks assessed/performed Overall Cognitive Status: Within Functional Limits for tasks assessed                                  General Comments: somehwat anxious r/t mobilizing initially, but able to be re-directed.   General Comments       Exercises Other Exercises Other Exercises: OT facilitates ed re: role of OT in acute setting, importance of OOB Activity, importance of getting to [facility] versus [facility]wick. Pt with good carryover and reception   Shoulder Instructions      Home Living Family/patient expects to be discharged to:: Private residence Living Arrangements: Alone Available Help at Discharge: Family;Available PRN/intermittently;Personal care attendant (Son lives close and is involved in mom's care, but works often, 12hr shifts. Aide 9-1 M-F) Type of Home: House Home Access: Ramped entrance     Home Layout: One level (slightly recessed Den, but with ramp)     Bathroom Shower/Tub: Teacher, early years/pre: Handicapped height Bathroom Accessibility: Yes How Accessible: Accessible via walker Home Equipment: Stigler - 4 wheels;Bedside commode;Shower seat;Grab bars - toilet;Grab bars - tub/shower;Wheelchair - power;Adaptive equipment;Other (comment) (sleeps in lift chair) Adaptive Equipment: Reacher        Prior Functioning/Environment Level of Independence: Needs assistance  Gait / Transfers Assistance Needed: Pt is MOD I with rollator in the home for short distances. Primarily sits up in her office chair. Power w/c for community mobility. Has adaptable Lucianne Lei she can drive with ramped access. ADL's / Homemaking Assistance Needed: aide M-F 9A-1P to help with breakfast/lunch and dinner meal prep. Assists with getting pt into/out of shower. Pt has cleaning person come once/month   Comments: sleeps in lift chair        OT Problem List: Decreased strength;Decreased range of motion;Decreased activity tolerance;Impaired balance (sitting and/or standing);Cardiopulmonary status limiting activity;Obesity;Pain      OT Treatment/Interventions: Self-care/ADL training;DME and/or AE  instruction;Therapeutic activities;Energy conservation;Patient/family education;Therapeutic exercise;Balance training    OT Goals(Current goals can be found in the care plan section) Acute Rehab OT Goals Patient Stated Goal: to go home and be able to age in place OT Goal Formulation: With patient/family Time For Goal Achievement: 11/12/20 Potential to Achieve Goals: Good ADL Goals Pt Will Perform Upper Body Bathing: with set-up;sitting Pt Will Perform Lower Body Bathing: with min guard assist;sit to/from stand (to.from shower chair as needed) Pt Will Transfer to Toilet: with supervision;ambulating (with LRAD to/from [facility] ~15' away) Pt Will Perform Toileting - Clothing Manipulation and hygiene: with supervision;sit to/from stand Pt/caregiver will Perform Home Exercise Program: Increased strength;Both right and left upper extremity;With Supervision  OT Frequency: Min 1X/week   Barriers to D/C:            Co-evaluation              AM-PAC OT "6 Clicks" Daily Activity     Outcome Measure Help from another person eating meals?: None Help from another person taking care of personal grooming?: A Little Help from another person toileting, which includes using toliet, bedpan, or urinal?: A Little Help  from another person bathing (including washing, rinsing, drying)?: A Little Help from another person to put on and taking off regular upper body clothing?: A Little Help from another person to put on and taking off regular lower body clothing?: A Little 6 Click Score: 19   End of Session Equipment Utilized During Treatment: Gait belt;Rolling walker Nurse Communication: Mobility status  Activity Tolerance: Patient tolerated treatment well Patient left: in chair;with call bell/phone within reach;with chair alarm set  OT Visit Diagnosis: Unsteadiness on feet (R26.81);Muscle weakness (generalized) (M62.81)                Time: 1287-8676 OT Time Calculation (min): 67 min Charges:  OT  General Charges $OT Visit: 1 Visit OT Evaluation $OT Eval Moderate Complexity: 1 Mod OT Treatments $Self Care/Home Management : 23-37 mins $Therapeutic Activity: 23-37 mins  Gerrianne Scale, MS, OTR/L ascom [phone] 10/29/20, 1:50 PM

## 2020-10-29 NOTE — Progress Notes (Signed)
PROGRESS NOTE    Teresa Krueger  OAC:[id number] DOB: 1934-05-14 DOA: 11/16/2020 PCP: Adin Hector, MD  Brief Narrative:   87 y.o. female with medical history significant of coronary artery disease, chronic lymphedema, COPD, chronic kidney disease stage III, hyperlipidemia, hypertension, diabetes and morbid obesity who was being worked up for angina at home.  Patient was supposed to have a cardiac stress test this week.  She started having chest pressure today shortness of breath.  The shortness of breath has been progressive over the last week.  Currently present even at rest without exertion.  She has pain that is been going on in the left side going to the neck and the back.  It is more pressure that is worse with exertion.  Patient also has noted progressive worsening of leg swelling.  She has cough and frothy sputum.  No fever or chills.  No sick contact.  Patient uses oxygen only at night at about 2 L.  Patient also recently was set up with a Holter monitor which she completed but the results are still pending.  She came to the ER where she was found to be hypoxic evidence of fluid overload as well as anxiety.  Patient progressively got worse in the ER and now is on BiPAP.  She is hypoxic and appears to have evidence of anasarca.  She has been admitted to the hospital for angina with acute on chronic respiratory failure with hypoxia.  5/7: Patient weaned from BiPAP.  Now on 4 L supplemental oxygen saturating 100%.  Still anasarca ache with evidence of decompensated failure.  Cardiology, Dr. Ubaldo Glassing consulted.  5/8: Patient weaned successfully from BiPAP.  Respiratory status improving.  Still with some mild anasarca.  Cardiology stopped Lasix due to concern for AKI in anticipation of contrast exposure during this admission.   Assessment & Plan:   Principal Problem:   Acute on chronic respiratory failure with hypoxia (HCC) Active Problems:   Diabetes mellitus (HCC)   GERD (gastroesophageal  reflux disease)   Morbid obesity (HCC)   Benign essential hypertension   Lymphedema   CKD (chronic kidney disease) stage 3, GFR 30-59 ml/min (HCC)   Angina at rest [facility])   CHF (congestive heart failure) (HCC)  Acute decompensated diastolic congestive heart failure Acute hypoxic respiratory failure secondary to above Patient is endorsing shortness of breath over the past week Was unable to go to her scheduled for stress test Required BiPAP in ED now weaned off Plan: Cardiology consulted, case discussed with Dr. Ubaldo Glassing Lasix on hold Strict I's and O's Daily weights Heart healthy diet Monitor and replace electrolytes.K>4; Mg>2 Follow any further cardiac recommendations  Chest pain Elevated troponin Could be a result of decompensated failure No ischemic changes on EKG Plan: Telemetry monitoring Trend troponins Patient may be a good candidate for cardiac catheterization  Insulin-dependent diabetes mellitus Well controlled at home Plan: Insulin 70/30 15 units twice daily per home dose Sliding scale coverage  Hypertension Hyperlipidemia Continue home Cozaar Continue home statin  Coronary artery disease Continue antiplatelet therapy  GERD PPI   AKI on CKD stage IIIa Cautious while on diuretics Daily BMP     DVT prophylaxis: SQ Lovenox Code Status: Full Family Communication: Daughter-in-law Freda Munro [phone] on 5/7 Disposition Plan: Status is: Inpatient  Remains inpatient appropriate because:Inpatient level of care appropriate due to severity of illness   Dispo: The patient is from: Home              Anticipated d/c  is to: Home              Patient currently is not medically stable to d/c.   Difficult to place patient No   Acute decompensated heart failure and chest pain of unclear etiology.  Cardiology following.  Possible catheterization during his hospital admission.    Level of care: Progressive Cardiac  Consultants:   Cardiology- [facility]   Procedures: None  Antimicrobials:   None    Subjective: Patient seen and examined.  Respiratory status improved.  Continues to complain of neck and knee pain.  Objective: Vitals:   10/29/20 0500 10/29/20 0730 10/29/20 0853 10/29/20 1221  BP:  (!) 130/44  (!) 143/58  Pulse:  64 65 62  Resp:  16 18 17   Temp:  98 F (36.7 C)    TempSrc:  Oral    SpO2:  100% 97% 99%  Weight: 121.6 kg     Height:        Intake/Output Summary (Last 24 hours) at 10/29/2020 1252 Last data filed at 10/29/2020 0945 Gross per 24 hour  Intake 720 ml  Output 1600 ml  Net -880 ml   Filed Weights   10/24/2020 2314 10/28/20 0500 10/29/20 0500  Weight: 122.7 kg 121.7 kg 121.6 kg    Examination:  General exam: No acute distress Respiratory system: Bibasilar crackles.  Normal work of breathing.  4 L Cardiovascular system: S1-S2, regular rate and rhythm, no murmurs, 2+ pitting edema bilaterally Gastrointestinal system: Obese, nontender, nondistended, normal bowel sounds  Central nervous system: Alert and oriented. No focal neurological deficits. Extremities: Decreased range of motion bilateral lower extremities Skin: Bilateral lower extremity lymphedema Psychiatry: Judgement and insight appear normal. Mood & affect appropriate.     Data Reviewed: I have personally reviewed following labs and imaging studies  CBC: Recent Labs  Lab 11/08/2020 1725 10/28/20 0015 10/28/20 1343  WBC 4.8 4.8 5.9  NEUTROABS 2.9  --  3.7  HGB 9.4* 9.3* 9.5*  HCT 29.9* 30.0* 30.1*  MCV 88.7 89.6 89.1  PLT 161 149* [id number]   Basic Metabolic Panel: Recent Labs  Lab 10/24/2020 1725 10/28/20 0015 10/29/20 0442  NA 136  --  137  K 5.2*  --  4.2  CL 100  --  100  CO2 27  --  26  GLUCOSE 210*  --  105*  BUN 63*  --  65*  CREATININE 1.40* 1.39* 1.47*  CALCIUM 9.1  --  8.9   GFR: Estimated Creatinine Clearance: 33 mL/min (A) (by C-G formula based on SCr of 1.47 mg/dL (H)). Liver Function Tests: No results  for input(s): AST, ALT, ALKPHOS, BILITOT, PROT, ALBUMIN in the last 168 hours. No results for input(s): LIPASE, AMYLASE in the last 168 hours. No results for input(s): AMMONIA in the last 168 hours. Coagulation Profile: No results for input(s): INR, PROTIME in the last 168 hours. Cardiac Enzymes: No results for input(s): CKTOTAL, CKMB, CKMBINDEX, TROPONINI in the last 168 hours. BNP (last 3 results) No results for input(s): PROBNP in the last 8760 hours. HbA1C: Recent Labs    10/28/20 0015  HGBA1C 6.0*   CBG: Recent Labs  Lab 10/28/20 1121 10/28/20 1622 10/28/20 2110 10/29/20 0729 10/29/20 1224  GLUCAP 162* 138* 109* 106* 172*   Lipid Profile: Recent Labs    10/28/20 0015  CHOL 126  HDL 77  LDLCALC 46  TRIG 17  CHOLHDL 1.6   Thyroid Function Tests: No results for input(s): TSH, T4TOTAL, FREET4, T3FREE, THYROIDAB  in the last 72 hours. Anemia Panel: No results for input(s): VITAMINB12, FOLATE, FERRITIN, TIBC, IRON, RETICCTPCT in the last 72 hours. Sepsis Labs: Recent Labs  Lab 10/28/20 0532  PROCALCITON <0.10    Recent Results (from the past 240 hour(s))  SARS CORONAVIRUS 2 (TAT 6-24 HRS) Nasopharyngeal Nasopharyngeal Swab     Status: None   Collection Time: 2020-11-18  5:27 PM   Specimen: Nasopharyngeal Swab  Result Value Ref Range Status   SARS Coronavirus 2 NEGATIVE NEGATIVE Final    Comment: (NOTE) SARS-CoV-2 target nucleic acids are NOT DETECTED.  The SARS-CoV-2 RNA is generally detectable in upper and lower respiratory specimens during the acute phase of infection. Negative results do not preclude SARS-CoV-2 infection, do not rule out co-infections with other pathogens, and should not be used as the sole basis for treatment or other patient management decisions. Negative results must be combined with clinical observations, patient history, and epidemiological information. The expected result is Negative.  Fact Sheet for  Patients: SugarRoll.be  Fact Sheet for Healthcare Providers: https://www.woods-mathews.com/  This test is not yet approved or cleared by the [place] FDA and  has been authorized for detection and/or diagnosis of SARS-CoV-2 by FDA under an Emergency Use Authorization (EUA). This EUA will remain  in effect (meaning this test can be used) for the duration of the COVID-19 declaration under Se ction 564(b)(1) of the Act, 21 U.S.C. section 360bbb-3(b)(1), unless the authorization is terminated or revoked sooner.  Performed at [facility], [place] [address] [place], [place] [address]   MRSA PCR Screening     Status: None   Collection Time: 2020/11/18 11:21 PM   Specimen: Nasopharyngeal  Result Value Ref Range Status   MRSA by PCR NEGATIVE NEGATIVE Final    Comment:        The GeneXpert MRSA Assay (FDA approved for NASAL specimens only), is one component of a comprehensive MRSA colonization surveillance program. It is not intended to diagnose MRSA infection nor to guide or monitor treatment for MRSA infections. Performed at [facility], [address] [place], [place] [address]          Radiology Studies: DG Chest 2 View  Result Date: 11-18-20 CLINICAL DATA:  Shortness of breath EXAM: CHEST - 2 VIEW COMPARISON:  December 07, 2018. FINDINGS: There is no edema or airspace opacity. There is slight atelectasis in the left mid lung. Heart is upper normal in size with pacemaker leads attached to the right atrium and right ventricle. No adenopathy. There is aortic atherosclerosis. There is degenerative change in the thoracic spine. IMPRESSION: Slight left midlung atelectasis. No edema or airspace opacity. Heart upper normal in size with pacemaker leads attached to right atrium and right ventricle. Aortic Atherosclerosis (ICD10-I70.0). Electronically Signed   By: Lowella Grip III M.D.   On: 2020/11/18 17:08   CT ANGIO CHEST  PE W OR WO CONTRAST  Result Date: 10/28/2020 CLINICAL DATA:  87 year old female with concern for pulmonary embolism. EXAM: CT ANGIOGRAPHY CHEST WITH CONTRAST TECHNIQUE: Multidetector CT imaging of the chest was performed using the standard protocol during bolus administration of intravenous contrast. Multiplanar CT image reconstructions and MIPs were obtained to evaluate the vascular anatomy. CONTRAST:  66mL OMNIPAQUE IOHEXOL 350 MG/ML SOLN COMPARISON:  Chest CT dated 02/07/2016. FINDINGS: Cardiovascular: Mild cardiomegaly. No pericardial effusion. There is coronary vascular calcification and calcification of the mitral annulus. Right pectoral pacemaker device. Advanced atherosclerotic calcification of the thoracic aorta. No aneurysmal dilatation. Evaluation of the  pulmonary arteries is limited due to respiratory motion artifact. No large or central pulmonary artery embolus identified. Mediastinum/Nodes: No hilar or mediastinal adenopathy. The esophagus is grossly unremarkable. No mediastinal fluid collection. Lungs/Pleura: There are small bilateral pleural effusions with bibasilar atelectasis. Diffuse interstitial and interlobular septal prominence and patchy bilateral ground-glass opacities consistent with vascular congestion and edema. Scattered ground-glass nodularity may represent edema or pneumonia. Clinical correlation is recommended. No pneumothorax. The central airways are patent. Upper Abdomen: There is a 15 mm hypodense lesion in the right lobe of the liver. Cholecystectomy. Indeterminate subcentimeter left renal hypodense lesion. Musculoskeletal: Osteopenia with scoliosis and degenerative changes of the spine. No acute osseous pathology. Review of the MIP images confirms the above findings. IMPRESSION: 1. No CT evidence of central pulmonary artery embolus. 2. Mild cardiomegaly with findings of CHF and small bilateral pleural effusions. Scattered ground-glass nodularity may represent edema or pneumonia.  Clinical correlation is recommended. 3. Aortic Atherosclerosis (ICD10-I70.0). Electronically Signed   By: Anner Crete M.D.   On: 10/28/2020 01:14        Scheduled Meds: . acetaminophen  650 mg Oral BH-q7a  . ALPRAZolam  0.5 mg Oral BID  . budesonide  0.5 mg Nebulization Daily  . capsaicin   Topical BID  . chlorhexidine  15 mL Mouth Rinse BID  . Chlorhexidine Gluconate Cloth  6 each Topical Q0600  . clopidogrel  75 mg Oral Daily  . enoxaparin (LOVENOX) injection  0.5 mg/kg Subcutaneous Q24H  . insulin aspart  0-15 Units Subcutaneous TID AC & HS  . insulin aspart protamine- aspart  15 Units Subcutaneous BID WC  . lidocaine  1 patch Transdermal Q24H  . mouth rinse  15 mL Mouth Rinse q12n4p  . multivitamin with minerals  1 tablet Oral QODAY  . mupirocin ointment   Topical BID  . pantoprazole  40 mg Oral Daily  . potassium chloride SA  20 mEq Oral Daily  . simvastatin  20 mg Oral q1600  . sodium chloride flush  3 mL Intravenous Q12H  . triamcinolone cream   Topical BID   Continuous Infusions:   LOS: 2 days    Time spent: 15 minutes    Sidney Ace, MD [facility] Pager [phone]  If 7PM-7AM, please contact night-coverage 10/29/2020, 12:52 PM

## 2020-10-29 NOTE — Progress Notes (Signed)
Patient Name: Teresa Krueger Date of Encounter: 10/29/2020  Hospital Problem List     Principal Problem:   Acute on chronic respiratory failure with hypoxia [facility]) Active Problems:   Diabetes mellitus (HCC)   GERD (gastroesophageal reflux disease)   Morbid obesity ([place])   Benign essential hypertension   Lymphedema   CKD (chronic kidney disease) stage 3, GFR 30-59 ml/min (HCC)   Angina at rest [facility])   CHF (congestive heart failure) ([place])    Patient Profile      HPI: MIKAILI FLIPPIN is a 87 y.o. female with history of lower extremity lymphedema, history of heart block status post permanent pacemaker, COPD, diabetes, hyperlipidemia, hypertension who presented to the emergency room with increasing shortness of breath.  She was scheduled for a outpatient Myoview for next week however the patient canceled it thinking that her lungs would preclude her being able to do this.  She has chest pain with both typical atypical features.  She states that it radiates from her chest to her back.  This appears to be present both at rest and with exertion but she does state that on occasion it worsens with exertion.  She denies syncope or presyncope.  She is compliant with her medications.  She states she had cardiac catheterizations at least twice in the past 1 at [facility] of 1 at [facility].  Cath at [facility] was done in 1988 showing normal coronary arteries.  Most recent echo done in December 2021 showed preserved LV function with mild aortic stenosis with a valve area of 1.0 cm by continuity equation.  She is resting comfortably at present.  Her troponin has peaked at 247.  There is another 1 pending.  BNP is 952.  6 months ago was 384.  EKG showed sinus rhythm with ventricular paced rhythm.  Chest x-ray revealed slight left midlung atelectasis.  No evidence of congestive heart failure.  Pacemaker present.  Chest CT revealed no pulmonary embolus.  There was mild cardiomegaly with possible  mild congestive heart failure.  Subjective   Patient denies chest pain.  Less short of breath.  Multiple complaints of abdominal discomfort at home prior to admission however this is not bothering her too much at present.  Renal function is slightly diminished.  Troponin and is remained in the mid to high 300 range.  Inpatient Medications    . acetaminophen  650 mg Oral BH-q7a  . ALPRAZolam  0.5 mg Oral BID  . budesonide  0.5 mg Nebulization Daily  . capsaicin   Topical BID  . chlorhexidine  15 mL Mouth Rinse BID  . Chlorhexidine Gluconate Cloth  6 each Topical Q0600  . clopidogrel  75 mg Oral Daily  . enoxaparin (LOVENOX) injection  0.5 mg/kg Subcutaneous Q24H  . insulin aspart  0-15 Units Subcutaneous TID AC & HS  . insulin aspart protamine- aspart  15 Units Subcutaneous BID WC  . lidocaine  1 patch Transdermal Q24H  . mouth rinse  15 mL Mouth Rinse q12n4p  . multivitamin with minerals  1 tablet Oral QODAY  . mupirocin ointment   Topical BID  . pantoprazole  40 mg Oral Daily  . potassium chloride SA  20 mEq Oral Daily  . simvastatin  20 mg Oral q1600  . sodium chloride flush  3 mL Intravenous Q12H  . triamcinolone cream   Topical BID    Vital Signs    Vitals:   10/28/20 2022 10/29/20 0400 10/29/20 0500 10/29/20 0730  BP: (!) 136/38 (!) 123/36  (!) 130/44  Pulse: 64   64  Resp: 17   16  Temp: 98.4 F (36.9 C) 97.7 F (36.5 C)  98 F (36.7 C)  TempSrc: Oral Oral  Oral  SpO2: 100% 100%  100%  Weight:   121.6 kg   Height:        Intake/Output Summary (Last 24 hours) at 10/29/2020 0857 Last data filed at 10/29/2020 0500 Gross per 24 hour  Intake 240 ml  Output 1600 ml  Net -1360 ml   Filed Weights   10/24/2020 2314 10/28/20 0500 10/29/20 0500  Weight: 122.7 kg 121.7 kg 121.6 kg    Physical Exam    GEN: Caucasian female with no acute complaints. HEENT: normal.  Neck: Supple, no JVD, carotid bruits, or masses. Cardiac: RRR, 1/6 to 2/6 systolic murmur radiating to  the outflow tract.  Lower extremity lymphedema Respiratory:  Respirations regular and unlabored, clear to auscultation bilaterally. GI: Soft, nontender, nondistended, BS + x 4.  Patient states she has abdominal discomfort after eating.   MS: no deformity or atrophy. Skin: warm and dry, no rash. Neuro:  Strength and sensation are intact. Psych: Normal affect.  Labs    CBC Recent Labs    11/06/2020 1725 10/28/20 0015 10/28/20 1343  WBC 4.8 4.8 5.9  NEUTROABS 2.9  --  3.7  HGB 9.4* 9.3* 9.5*  HCT 29.9* 30.0* 30.1*  MCV 88.7 89.6 89.1  PLT 161 149* [id number]   Basic Metabolic Panel Recent Labs    10/26/2020 1725 10/28/20 0015 10/29/20 0442  NA 136  --  137  K 5.2*  --  4.2  CL 100  --  100  CO2 27  --  26  GLUCOSE 210*  --  105*  BUN 63*  --  65*  CREATININE 1.40* 1.39* 1.47*  CALCIUM 9.1  --  8.9   Liver Function Tests No results for input(s): AST, ALT, ALKPHOS, BILITOT, PROT, ALBUMIN in the last 72 hours. No results for input(s): LIPASE, AMYLASE in the last 72 hours. Cardiac Enzymes No results for input(s): CKTOTAL, CKMB, CKMBINDEX, TROPONINI in the last 72 hours. BNP Recent Labs    11/12/2020 1725 10/28/20 0532  BNP 593.5* 952.4*   D-Dimer Recent Labs    11/05/2020 1725  DDIMER 1.05*   Hemoglobin A1C Recent Labs    10/28/20 0015  HGBA1C 6.0*   Fasting Lipid Panel Recent Labs    10/28/20 0015  CHOL 126  HDL 77  LDLCALC 46  TRIG 17  CHOLHDL 1.6   Thyroid Function Tests No results for input(s): TSH, T4TOTAL, T3FREE, THYROIDAB in the last 72 hours.  Invalid input(s): FREET3  Telemetry    AV pacing  ECG    Ventricular pacing  Radiology    DG Chest 2 View  Result Date: 10/28/2020 CLINICAL DATA:  Shortness of breath EXAM: CHEST - 2 VIEW COMPARISON:  December 07, 2018. FINDINGS: There is no edema or airspace opacity. There is slight atelectasis in the left mid lung. Heart is upper normal in size with pacemaker leads attached to the right atrium and right  ventricle. No adenopathy. There is aortic atherosclerosis. There is degenerative change in the thoracic spine. IMPRESSION: Slight left midlung atelectasis. No edema or airspace opacity. Heart upper normal in size with pacemaker leads attached to right atrium and right ventricle. Aortic Atherosclerosis (ICD10-I70.0). Electronically Signed   By: Lowella Grip III M.D.   On: 11/19/2020 17:08   CT ANGIO  CHEST PE W OR WO CONTRAST  Result Date: 10/28/2020 CLINICAL DATA:  87 year old female with concern for pulmonary embolism. EXAM: CT ANGIOGRAPHY CHEST WITH CONTRAST TECHNIQUE: Multidetector CT imaging of the chest was performed using the standard protocol during bolus administration of intravenous contrast. Multiplanar CT image reconstructions and MIPs were obtained to evaluate the vascular anatomy. CONTRAST:  32mL OMNIPAQUE IOHEXOL 350 MG/ML SOLN COMPARISON:  Chest CT dated 02/07/2016. FINDINGS: Cardiovascular: Mild cardiomegaly. No pericardial effusion. There is coronary vascular calcification and calcification of the mitral annulus. Right pectoral pacemaker device. Advanced atherosclerotic calcification of the thoracic aorta. No aneurysmal dilatation. Evaluation of the pulmonary arteries is limited due to respiratory motion artifact. No large or central pulmonary artery embolus identified. Mediastinum/Nodes: No hilar or mediastinal adenopathy. The esophagus is grossly unremarkable. No mediastinal fluid collection. Lungs/Pleura: There are small bilateral pleural effusions with bibasilar atelectasis. Diffuse interstitial and interlobular septal prominence and patchy bilateral ground-glass opacities consistent with vascular congestion and edema. Scattered ground-glass nodularity may represent edema or pneumonia. Clinical correlation is recommended. No pneumothorax. The central airways are patent. Upper Abdomen: There is a 15 mm hypodense lesion in the right lobe of the liver. Cholecystectomy. Indeterminate  subcentimeter left renal hypodense lesion. Musculoskeletal: Osteopenia with scoliosis and degenerative changes of the spine. No acute osseous pathology. Review of the MIP images confirms the above findings. IMPRESSION: 1. No CT evidence of central pulmonary artery embolus. 2. Mild cardiomegaly with findings of CHF and small bilateral pleural effusions. Scattered ground-glass nodularity may represent edema or pneumonia. Clinical correlation is recommended. 3. Aortic Atherosclerosis (ICD10-I70.0). Electronically Signed   By: Anner Crete M.D.   On: 10/28/2020 01:14    Assessment & Plan    87 y.o. female with history of lower extremity lymphedema, history of heart block status post permanent pacemaker, COPD, diabetes, hyperlipidemia, hypertension who presented to the emergency room with increasing shortness of breath.  She was scheduled for a outpatient Myoview for next week however the patient canceled it thinking that her lungs would preclude her being able to do this.  She has chest pain with both typical atypical features.  She states that it radiates from her chest to her back.  This appears to be present both at rest and with exertion but she does state that on occasion it worsens with exertion.  She denies syncope or presyncope.  She is compliant with her medications.  She states she had cardiac catheterizations at least twice in the past 1 at [facility] of 1 at [facility].  Cath at [facility] was done in 1988 showing normal coronary arteries.  Most recent echo done in December 2021 showed preserved LV function with mild aortic stenosis with a valve area of 1.0 cm by continuity equation.  She is resting comfortably at present.  Her troponin has peaked at 247.  There is another 1 pending.  BNP is 952.  6 months ago was 384.  EKG showed sinus rhythm with ventricular paced rhythm.  Chest x-ray revealed slight left midlung atelectasis.  No evidence of congestive heart failure.  Pacemaker present.   Chest CT revealed no pulmonary embolus.  There was mild cardiomegaly with possible mild congestive heart failure.  1.  Chest pain-etiology unclear.  Mildly elevated serum troponins thus far.  This could be secondary to demand due to respiratory failure as well as mild congestive heart failure.  Alternatively, given her symptoms ischemia may be present.  We will review subsequent troponin.  Patient was scheduled for a  functional study which has been canceled by the patient as she does not feel she would be able to go through it in her "respiratory state".  Discussed noninvasive versus invasive treatment with the patient yesterday.  She feels like she may need further evaluation of the coronaries.  Her troponin has peaked at the 300 range but has remained flat.  This does not appear to have been an acute coronary event however likely is demand ischemia in the face of respiratory distress.  Certainly the patient may have progression of her coronary disease.  Cardiac catheterizations in the distant past have not shown any significant disease.  As mentioned above, she is currently scheduled for cardiac catheterization in the morning however her renal function has continued to worsen somewhat.  Her creatinine October 19, 2020 as an outpatient was 1.1.  Is now 1.49.  I have stopped her IV Lasix.  Based on her renal function in the morning, may need to defer cardiac catheterization.  She received contrast on admission for her CTA and repeat contrast burden may cause worsening renal insufficiency especially given her probable volume depleted state.  Further recommendations will be forthcoming in the morning after renal function has been assessed in the patient's condition.  2.  Lymphedema-chronic.  Does not appear to pit.  3.  Heart block-has a permanent pacemaker which is ventricular pacing.  Functioning normally.  4.  COPD-continue with bronchodilators,  5.  Diabetes-continue with current therapy  6.   Hyperlipidemia-continue with statins.  7.  Renal insufficiency-creatinine was 1.1 on October 11, 2020.  Now is 1.49.  Received contrast on admission.  Avva.  Diuresis.  Will follow renal function in the morning and make decisions regarding further contrast load at that time.  8.  Abdominal discomfort-patient states she had some postprandial abdominal pain at home.  She states it is better here.  She is desiring further work-up.  Appears stable at present.   Signed, Javier Docker Jerianne Anselmo MD 10/29/2020, 8:57 AM  Pager: [phone]

## 2020-10-30 ENCOUNTER — Encounter: Admission: EM | Disposition: E | Payer: Self-pay | Source: Home / Self Care | Attending: Internal Medicine

## 2020-10-30 DIAGNOSIS — J9621 Acute and chronic respiratory failure with hypoxia: Secondary | ICD-10-CM | POA: Diagnosis not present

## 2020-10-30 DIAGNOSIS — R0600 Dyspnea, unspecified: Secondary | ICD-10-CM | POA: Diagnosis not present

## 2020-10-30 DIAGNOSIS — R079 Chest pain, unspecified: Secondary | ICD-10-CM | POA: Diagnosis not present

## 2020-10-30 LAB — GLUCOSE, CAPILLARY
Glucose-Capillary: 115 mg/dL — ABNORMAL HIGH (ref 70–99)
Glucose-Capillary: 211 mg/dL — ABNORMAL HIGH (ref 70–99)
Glucose-Capillary: 106 mg/dL — ABNORMAL HIGH (ref 70–99)
Glucose-Capillary: 102 mg/dL — ABNORMAL HIGH (ref 70–99)

## 2020-10-30 LAB — BASIC METABOLIC PANEL
Anion gap: 10 (ref 5–15)
BUN: 70 mg/dL — ABNORMAL HIGH (ref 8–23)
CO2: 25 mmol/L (ref 22–32)
Calcium: 8.9 mg/dL (ref 8.9–10.3)
Chloride: 103 mmol/L (ref 98–111)
Creatinine, Ser: 1.64 mg/dL — ABNORMAL HIGH (ref 0.44–1.00)
GFR, Estimated: 30 mL/min — ABNORMAL LOW (ref >60)
Glucose, Bld: 109 mg/dL — ABNORMAL HIGH (ref 70–99)
Potassium: 5 mmol/L (ref 3.5–5.1)
Sodium: 138 mmol/L (ref 135–145)

## 2020-10-30 LAB — CBC WITH DIFFERENTIAL/PLATELET
Abs Immature Granulocytes: 0.01 10*3/uL (ref 0.00–0.07)
Basophils Absolute: 0 10*3/uL (ref 0.0–0.1)
Basophils Relative: 1 %
Eosinophils Absolute: 0.8 10*3/uL — ABNORMAL HIGH (ref 0.0–0.5)
Eosinophils Relative: 16 %
HCT: 32.1 % — ABNORMAL LOW (ref 36.0–46.0)
Hemoglobin: 9.7 g/dL — ABNORMAL LOW (ref 12.0–15.0)
Immature Granulocytes: 0 %
Lymphocytes Relative: 14 %
Lymphs Abs: 0.7 10*3/uL (ref 0.7–4.0)
MCH: 28.2 pg (ref 26.0–34.0)
MCHC: 30.2 g/dL (ref 30.0–36.0)
MCV: 93.3 fL (ref 80.0–100.0)
Monocytes Absolute: 0.6 10*3/uL (ref 0.1–1.0)
Monocytes Relative: 12 %
Neutro Abs: 2.9 10*3/uL (ref 1.7–7.7)
Neutrophils Relative %: 57 %
Platelets: 130 10*3/uL — ABNORMAL LOW (ref 150–400)
RBC: 3.44 MIL/uL — ABNORMAL LOW (ref 3.87–5.11)
RDW: 13.5 % (ref 11.5–15.5)
WBC: 5 10*3/uL (ref 4.0–10.5)
nRBC: 0 % (ref 0.0–0.2)

## 2020-10-30 LAB — PROCALCITONIN: Procalcitonin: <0.1 ng/mL

## 2020-10-30 SURGERY — LEFT HEART CATH AND CORONARY ANGIOGRAPHY
Anesthesia: Moderate Sedation

## 2020-10-30 MED ORDER — SODIUM CHLORIDE 0.9% FLUSH: 3.0000 mL | INTRAVENOUS | Status: DC | PRN

## 2020-10-30 MED ORDER — SODIUM CHLORIDE 0.9 % IV SOLN: 250.0000 mL | INTRAVENOUS | Status: DC | PRN

## 2020-10-30 MED ORDER — SODIUM CHLORIDE 0.9 % IV SOLN: INTRAVENOUS | Status: DC

## 2020-10-30 MED ORDER — ASPIRIN 81 MG PO CHEW: 81.0000 mg | CHEWABLE_TABLET | ORAL | Status: DC

## 2020-10-30 NOTE — Care Management Important Message (Signed)
Important Message  Patient Details  Name: Teresa Krueger MRN: [id number] Date of Birth: December 06, 1933   Medicare Important Message Given:  Yes     Dannette Barbara 10/23/2020, 12:03 PM

## 2020-10-30 NOTE — Progress Notes (Signed)
[facility] Vascular Consult Note  MRN : [id number]  Teresa Krueger is a 87 y.o. (Jul 24, 1933) female who presents with chief complaint of  Chief Complaint  Patient presents with  . Shortness of Breath   History of Present Illness: Teresa Krueger is a 87 year old female with medical history significant of coronary artery disease, chronic lymphedema, COPD, chronic kidney disease stage III, hyperlipidemia, hypertension, diabetes and morbid obesity who was being worked up for angina at home. Patient was supposed to have a cardiac stress test this week. She started having chest pressure today shortness of breath.   The shortness of breath has been progressive over the last week. Currently present even at rest without exertion. She has pain that is been going on in the left side going to the neck and the back.  It is more pressure that is worse with exertion. Patient also has noted progressive worsening of leg swelling. She has cough and frothy sputum.  No fever or chills. No sick contact. Patient uses oxygen only at night at about 2 L. Patient also recently was set up with a Holter monitor which she completed but the results are still pending.   She came to the ER where she was found to be hypoxic evidence of fluid overload as well as anxiety.  Patient progressively got worse in the ER and now is on BiPAP.  She is hypoxic and appears to have evidence of anasarca.  She has been admitted to the hospital for angina with acute on chronic respiratory failure with hypoxia.  Vascular surgery was consulted by Dr. 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  Current Facility-Administered Medications  Medication Dose Route Frequency Provider Last Rate Last Admin  . 0.9 %  sodium chloride infusion  250 mL Intravenous PRN Teodoro Spray, MD      . Derrill Memo ON 10/31/2020] 0.9 %  sodium chloride infusion   Intravenous Continuous Teodoro Spray, MD      . acetaminophen  (TYLENOL) tablet 650 mg  650 mg Oral Q4H PRN Elwyn Reach, MD      . acetaminophen (TYLENOL) tablet 650 mg  650 mg Oral Ala Dach, MD   650 mg at 11/09/2020 0534  . ALPRAZolam Duanne Moron) tablet 0.5 mg  0.5 mg Oral BID Renda Rolls, RPH   0.5 mg at 11/12/2020 1003   And  . ALPRAZolam Duanne Moron) tablet 0.5 mg  0.5 mg Oral Daily PRN Renda Rolls, RPH   0.5 mg at 10/28/20 1515  . budesonide (PULMICORT) nebulizer solution 0.5 mg  0.5 mg Nebulization Daily Gala Romney L, MD   0.5 mg at 11/20/2020 1007  . calcium carbonate (TUMS - dosed in mg elemental calcium) chewable tablet 200 mg of elemental calcium  1 tablet Oral PRN Gala Romney L, MD      . capsaicin (ZOSTRIX) 0.025 % cream   Topical BID Sidney Ace, MD   Given at 10/24/2020 1004  . chlorhexidine (PERIDEX) 0.12 % solution 15 mL  15 mL Mouth Rinse BID Gala Romney L, MD   15 mL at 11/13/2020 0959  . Chlorhexidine Gluconate Cloth 2 % PADS 6 each  6 each Topical Q0600 Elwyn Reach, MD   6 each at 11/08/2020 0529  . clopidogrel (PLAVIX) tablet 75 mg  75 mg Oral Daily Elwyn Reach, MD   75 mg at 10/31/2020 0959  . enoxaparin (LOVENOX) injection 60 mg  0.5 mg/kg  Subcutaneous Q24H Ralene Muskrat B, MD   60 mg at 11/19/2020 1005  . insulin aspart (novoLOG) injection 0-15 Units  0-15 Units Subcutaneous TID AC & HS Sharion Settler, NP   3 Units at 10/29/20 2145  . insulin aspart protamine- aspart (NOVOLOG MIX 70/30) injection 15 Units  15 Units Subcutaneous BID WC Elwyn Reach, MD   15 Units at 10/27/2020 1000  . lidocaine (LIDODERM) 5 % 1 patch  1 patch Transdermal Q24H Sreenath, Sudheer B, MD      . magnesium hydroxide (MILK OF MAGNESIA) suspension 30 mL  30 mL Oral QHS PRN Jonelle Sidle, Mohammad L, MD      . MEDLINE mouth rinse  15 mL Mouth Rinse q12n4p Jonelle Sidle, Mohammad L, MD   15 mL at 10/29/20 1715  . multivitamin with minerals tablet 1 tablet  1 tablet Oral Ervin Knack, MD   1 tablet at 10/29/2020 1006  .  mupirocin ointment (BACTROBAN) 2 %   Topical BID Elwyn Reach, MD   1 application at [id number]  . ondansetron (ZOFRAN) injection 4 mg  4 mg Intravenous Q6H PRN Gala Romney L, MD      . pantoprazole (PROTONIX) EC tablet 40 mg  40 mg Oral Daily Gala Romney L, MD   40 mg at 11/11/2020 1006  . potassium chloride SA (KLOR-CON) CR tablet 20 mEq  20 mEq Oral Daily Gala Romney L, MD   20 mEq at 11/12/2020 1006  . simvastatin (ZOCOR) tablet 20 mg  20 mg Oral q1600 Elwyn Reach, MD   20 mg at 10/29/20 1714  . sodium chloride flush (NS) 0.9 % injection 3 mL  3 mL Intravenous Q12H Teodoro Spray, MD   3 mL at 10/28/2020 1006  . sodium chloride flush (NS) 0.9 % injection 3 mL  3 mL Intravenous PRN Teodoro Spray, MD      . triamcinolone cream (KENALOG) 0.1 % cream   Topical BID Elwyn Reach, MD   Given at 11/08/2020 1002   Past Medical History:  Diagnosis Date  . Arthritis   . Asthma   . Barrett's esophagus   . Breast cancer ([place]) 1999   LT MASTECTOMY  . Bronchiectasis ([place])   . COPD (chronic obstructive pulmonary disease) ([place])   . Diabetes mellitus ([place])   . Hypercholesterolemia   . Hypertension   . Lymphedema   . Mini stroke ([place])   . Osteoporosis   . Pacemaker    Past Surgical History:  Procedure Laterality Date  . CARPAL TUNNEL RELEASE     right  . CATARACT EXTRACTION     bilateral  . GALLBLADDER SURGERY    . MASTECTOMY Left 1999   BREAST CA  . PACEMAKER INSERTION Right 03/21/2015   Procedure: INSERTION PACEMAKER;  Surgeon: Isaias Cowman, MD;  Location: [facility] ORS;  Service: Cardiovascular;  Laterality: Right;  . TOTAL HIP ARTHROPLASTY  2001   left  . TRIGGER FINGER RELEASE     bilateral   Social History Social History   Tobacco Use  . Smoking status: Former Smoker    Packs/day: 1.00    Years: 30.00    Pack years: 30.00    Types: Cigarettes    Quit date: 12/27/1992    Years since quitting: 27.8  . Smokeless tobacco: Never Used  Substance Use  Topics  . Alcohol use: No  . Drug use: No   Family History Family History  Problem Relation Age of Onset  .  Heart disease Paternal Grandmother   . Diabetes Paternal Grandmother   . Heart disease Father   . Heart disease Mother   . Heart disease Maternal Grandmother   . Stroke Maternal Aunt   . Breast cancer Paternal Aunt 94  . Lung cancer Brother 4  . Kidney disease Cousin   . Bladder Cancer Neg Hx   Denies family history of peripheral artery disease or venous disease. Strong family history disease in cousin.  Allergies  Allergen Reactions  . Aspirin Swelling and Other (See Comments)    Pt states that she has tongue swelling.    . Celebrex [Celecoxib] Anaphylaxis  . Meloxicam Swelling  . Nsaids Anaphylaxis, Other (See Comments) and Nausea And Vomiting    unknown   . Quetiapine Anaphylaxis    intolerant  . Rofecoxib Anaphylaxis  . Sulfa Antibiotics Hives, Itching, Anaphylaxis, Nausea And Vomiting and Other (See Comments)  . Tolmetin Anaphylaxis  . Bactrim [Sulfamethoxazole-Trimethoprim] Hives and Itching  . Biguanide Copolymer Other (See Comments)    Reaction:  Unknown   . Buprenorphine Hcl Other (See Comments)    Reaction:  Unknown   . Cetirizine Other (See Comments)  . Ciprofloxacin Other (See Comments)    Reaction:  Unknown   . Codeine Nausea And Vomiting and Other (See Comments)    Reaction:  Syncope   . Dextromethorphan Hbr Other (See Comments)    Reaction:  GI upset   . Dextromethorphan Polistirex Er Other (See Comments)    Reaction:  GI upset  GI upset  . Furosemide Other (See Comments)    Reaction:  Unknown   . Hydrocodone Other (See Comments)    Reaction:  Unknown   . Metformin Other (See Comments)    Reaction:  Unknown   . Morphine And Related Other (See Comments)    Reaction:  Unknown   . Penicillins Swelling and Other (See Comments)    Pt did not answer the additional questions for this medication because she does not remember.    . Pregabalin  Other (See Comments)    Reaction:  Unknown   . Promethazine Other (See Comments)    Reaction:  Unknown   . Propoxyphene Other (See Comments)    Reaction:  Unknown   . Propoxyphene Other (See Comments)  . Quinolones Other (See Comments)    Reaction:  Unknown   . Seroquel [Quetiapine Fumarate] Other (See Comments)    Reaction:  Unknown   . Serotonin Reuptake Inhibitors (Ssris) Other (See Comments)    Reaction:  Unknown   . Tramadol Nausea Only  . Ultracet [Tramadol-Acetaminophen] Other (See Comments)    Reaction:  Unknown    REVIEW OF SYSTEMS (Negative unless checked)  Constitutional: [] Weight loss  [] Fever  [] Chills Cardiac: [] Chest pain   [] Chest pressure   [] Palpitations   [x] Shortness of breath when laying flat   [x] Shortness of breath at rest   [x] Shortness of breath with exertion. Vascular:  [] Pain in legs with walking   [] Pain in legs at rest   [] Pain in legs when laying flat   [] Claudication   [] Pain in feet when walking  [] Pain in feet at rest  [] Pain in feet when laying flat   [] History of DVT   [] Phlebitis   [] Swelling in legs   [] Varicose veins   [] Non-healing ulcers Pulmonary:   [] Uses home oxygen   [] Productive cough   [] Hemoptysis   [] Wheeze  [] COPD   [] Asthma Neurologic:  [] Dizziness  [] Blackouts   [] Seizures   [] History  of stroke   [] History of TIA  [] Aphasia   [] Temporary blindness   [] Dysphagia   [] Weakness or numbness in arms   [] Weakness or numbness in legs Musculoskeletal:  [] Arthritis   [] Joint swelling   [] Joint pain   [] Low back pain Hematologic:  [] Easy bruising  [] Easy bleeding   [] Hypercoagulable state   [] Anemic  [] Hepatitis Gastrointestinal:  [] Blood in stool   [] Vomiting blood  [] Gastroesophageal reflux/heartburn   [] Difficulty swallowing. Genitourinary:  [x] Chronic kidney disease   [] Difficult urination  [] Frequent urination  [] Burning with urination   [] Blood in urine Skin:  [] Rashes   [] Ulcers   [] Wounds Psychological:  [] History of anxiety   []  History of  major depression.  Physical Examination  Vitals:   11/19/2020 0500 11/05/2020 0527 11/03/2020 0844 10/22/2020 1154  BP:  (!) 132/45 (!) 158/62 (!) 131/53  Pulse:  65 80 61  Resp:  18 18 18   Temp:  97.7 F (36.5 C) 97.8 F (36.6 C) (!) 97.4 F (36.3 C)  TempSrc:  Oral Oral Oral  SpO2:  100% 99% 100%  Weight: 123.2 kg     Height:       Body mass index is 54.86 kg/m. Gen:  WD/WN, NAD Head: [employer]/AT, No temporalis wasting. Prominent temp pulse not noted. Ear/Nose/Throat: Hearing grossly intact, nares w/o erythema or drainage, oropharynx w/o Erythema/Exudate Eyes: Sclera non-icteric, conjunctiva clear Neck: Trachea midline.  No JVD.  Pulmonary:  Good air movement, respirations not labored, equal bilaterally.  Cardiac: RRR, normal S1, S2. Vascular:  Vessel Right Left  Radial Palpable Palpable  Ulnar Palpable Palpable                               Gastrointestinal: soft, non-tender/non-distended. No guarding/reflex.  Musculoskeletal: M/S 5/5 throughout.  Extremities without ischemic changes.  No deformity or atrophy. Severe edema. Neurologic: Sensation grossly intact in extremities.  Symmetrical.  Speech is fluent. Motor exam as listed above. Psychiatric: Judgment intact, Mood & affect appropriate for pt's clinical situation. Dermatologic: No rashes or ulcers noted.  No cellulitis or open wounds. Lymph : No Cervical, Axillary, or Inguinal lymphadenopathy.  CBC Lab Results  Component Value Date   WBC 5.0 11/01/2020   HGB 9.7 (L) 11/14/2020   HCT 32.1 (L) 11/20/2020   MCV 93.3 11/10/2020   PLT 130 (L) 11/10/2020   BMET    Component Value Date/Time   NA 138 11/14/2020 0342   NA 141 03/18/2012 1341   K 5.0 11/10/2020 0342   K 4.9 03/18/2012 1341   CL 103 11/12/2020 0342   CL 104 03/18/2012 1341   CO2 25 11/20/2020 0342   CO2 29 03/18/2012 1341   GLUCOSE 109 (H) 10/30/2020 0342   GLUCOSE 64 (L) 03/18/2012 1341   BUN 70 (H) 11/03/2020 0342   BUN 34 (H) 03/18/2012 1341    CREATININE 1.64 (H) 11/01/2020 0342   CREATININE 1.40 (H) 04/06/2012 1359   CALCIUM 8.9 11/12/2020 0342   CALCIUM 9.4 03/18/2012 1341   GFRNONAA 30 (L) 10/23/2020 0342   GFRNONAA 36 (L) 04/06/2012 1359   GFRAA 54 (L) 07/19/2019 1612   GFRAA 42 (L) 04/06/2012 1359   Estimated Creatinine Clearance: 29.8 mL/min (A) (by C-G formula based on SCr of 1.64 mg/dL (H)).  COAG Lab Results  Component Value Date   INR 1.09 10/14/2016   INR 1.16 03/20/2015   Radiology DG Chest 2 View  Result Date: 10/27/2020 CLINICAL DATA:  Shortness  of breath EXAM: CHEST - 2 VIEW COMPARISON:  December 07, 2018. FINDINGS: There is no edema or airspace opacity. There is slight atelectasis in the left mid lung. Heart is upper normal in size with pacemaker leads attached to the right atrium and right ventricle. No adenopathy. There is aortic atherosclerosis. There is degenerative change in the thoracic spine. IMPRESSION: Slight left midlung atelectasis. No edema or airspace opacity. Heart upper normal in size with pacemaker leads attached to right atrium and right ventricle. Aortic Atherosclerosis (ICD10-I70.0). Electronically Signed   By: Lowella Grip III M.D.   On: 11/17/2020 17:08   CT ANGIO CHEST PE W OR WO CONTRAST  Result Date: 10/28/2020 CLINICAL DATA:  87 year old female with concern for pulmonary embolism. EXAM: CT ANGIOGRAPHY CHEST WITH CONTRAST TECHNIQUE: Multidetector CT imaging of the chest was performed using the standard protocol during bolus administration of intravenous contrast. Multiplanar CT image reconstructions and MIPs were obtained to evaluate the vascular anatomy. CONTRAST:  22mL OMNIPAQUE IOHEXOL 350 MG/ML SOLN COMPARISON:  Chest CT dated 02/07/2016. FINDINGS: Cardiovascular: Mild cardiomegaly. No pericardial effusion. There is coronary vascular calcification and calcification of the mitral annulus. Right pectoral pacemaker device. Advanced atherosclerotic calcification of the thoracic aorta. No  aneurysmal dilatation. Evaluation of the pulmonary arteries is limited due to respiratory motion artifact. No large or central pulmonary artery embolus identified. Mediastinum/Nodes: No hilar or mediastinal adenopathy. The esophagus is grossly unremarkable. No mediastinal fluid collection. Lungs/Pleura: There are small bilateral pleural effusions with bibasilar atelectasis. Diffuse interstitial and interlobular septal prominence and patchy bilateral ground-glass opacities consistent with vascular congestion and edema. Scattered ground-glass nodularity may represent edema or pneumonia. Clinical correlation is recommended. No pneumothorax. The central airways are patent. Upper Abdomen: There is a 15 mm hypodense lesion in the right lobe of the liver. Cholecystectomy. Indeterminate subcentimeter left renal hypodense lesion. Musculoskeletal: Osteopenia with scoliosis and degenerative changes of the spine. No acute osseous pathology. Review of the MIP images confirms the above findings. IMPRESSION: 1. No CT evidence of central pulmonary artery embolus. 2. Mild cardiomegaly with findings of CHF and small bilateral pleural effusions. Scattered ground-glass nodularity may represent edema or pneumonia. Clinical correlation is recommended. 3. Aortic Atherosclerosis (ICD10-I70.0). Electronically Signed   By: Anner Crete M.D.   On: 10/28/2020 01:14   Assessment/Plan The patient is an 87 year old female with multiple medical issues including known carotid artery stenosis and venous disease / lymphedema (last seen by Dr. Lucky Cowboy on June 20, 2020)  1. Possible Mesenteric Stenosis: Patient endorses a history of experiencing lower right and left abdominal pain about 30-40 minutes after she eats. Denies any nausea or vomiting associated with that discomfort. Patient has not lost any weight. Patient states that her symptoms have improved now that she has been admitted. Patient does have stage III chronic kidney disease. At  this time, the patient states that her symptoms have resolved and with her current renal function do not recommend moving forward with any type of angiogram at this point.  I have asked her to pay attention to see if any postprandial discomfort occurs today with lunch and dinner. I will reevaluate her tomorrow  2.  Lymphedema / Venous Insufficiency: Patient is well-known to our service. We have tried to implement treatment plans for her lower extremity lymphedema as well as venous insufficiency. Unfortunately, the patient does not engage is in conservative therapy including wearing her medical grade one compression socks, elevating her legs and remaining active. The patient also does not use her lymphedema  pump at least 2-3 times a day for an hour each time as per our recommendations.   3.  Chronic Kidney Disease Stage III: Any type of intervention would require the use of contrast.  Unless we are absolutely sure the patient is experiencing symptoms due to mesenteric stenosis (postprandial pain, nausea, vomiting or change in bowel habits etc). Would not move forward with intervention.  Discussed with Dr. Mayme Genta, PA-C  11/29/20 12:24 PM  This note was created with Dragon medical transcription system.  Any error is purely unintentional.

## 2020-10-30 NOTE — Progress Notes (Signed)
PROGRESS NOTE    Teresa Krueger  ZOX:[id number] DOB: Apr 16, 1934 DOA: 11/17/2020 PCP: Adin Hector, MD  Brief Narrative:   87 y.o. female with medical history significant of coronary artery disease, chronic lymphedema, COPD, chronic kidney disease stage III, hyperlipidemia, hypertension, diabetes and morbid obesity who was being worked up for angina at home.  Patient was supposed to have a cardiac stress test this week.  She started having chest pressure today shortness of breath.  The shortness of breath has been progressive over the last week.  Currently present even at rest without exertion.  She has pain that is been going on in the left side going to the neck and the back.  It is more pressure that is worse with exertion.  Patient also has noted progressive worsening of leg swelling.  She has cough and frothy sputum.  No fever or chills.  No sick contact.  Patient uses oxygen only at night at about 2 L.  Patient also recently was set up with a Holter monitor which she completed but the results are still pending.  She came to the ER where she was found to be hypoxic evidence of fluid overload as well as anxiety.  Patient progressively got worse in the ER and now is on BiPAP.  She is hypoxic and appears to have evidence of anasarca.  She has been admitted to the hospital for angina with acute on chronic respiratory failure with hypoxia.  5/7: Patient weaned from BiPAP.  Now on 4 L supplemental oxygen saturating 100%.  Still anasarca ache with evidence of decompensated failure.  Cardiology, Dr. Ubaldo Glassing consulted.  5/8: Patient weaned successfully from BiPAP.  Respiratory status improving.  Still with some mild anasarca.  Cardiology stopped Lasix due to concern for AKI in anticipation of contrast exposure during this admission.  5/9: Creatinine continues to worsen.  Cardiac catheterization deferred.  Volume status little unclear at the moment.   Assessment & Plan:   Principal Problem:   Acute  on chronic respiratory failure with hypoxia (HCC) Active Problems:   Diabetes mellitus (HCC)   GERD (gastroesophageal reflux disease)   Morbid obesity (HCC)   Benign essential hypertension   Lymphedema   CKD (chronic kidney disease) stage 3, GFR 30-59 ml/min (HCC)   Angina at rest [facility])   CHF (congestive heart failure) (HCC)  Acute decompensated diastolic congestive heart failure Acute hypoxic respiratory failure secondary to above Patient is endorsing shortness of breath over the past week Was unable to go to her scheduled for stress test Required BiPAP in ED now weaned off Plan: Cardiology consulted, case discussed with Dr. Ubaldo Glassing Lasix on hold Strict I's and O's Daily weights Heart healthy diet Monitor and replace electrolytes.K>4; Mg>2 Follow any further cardiac recommendations Possible ischemic evaluation during this admission Catheterization on hold given AKI on CKD  AKI on CKD stage IIIa Creatinine continues to worsen Possible CIN Patient got contrast load on admission for CT angio Plan: Hold Lasix Hold ACE inhibitor Avoid nephrotoxins Catheterization on hold  Chest pain Elevated troponin Could be a result of decompensated failure No ischemic changes on EKG Plan: Telemetry monitoring Trend troponins Tentative plan for cardiac catheterization during this admission Plan limited by AKI  Insulin-dependent diabetes mellitus Well controlled at home Plan: Insulin 70/30 15 units twice daily per home dose Sliding scale coverage  Hypertension Hyperlipidemia Continue home Cozaar Continue home statin  Coronary artery disease Continue antiplatelet therapy  GERD PPI  Possible mesenteric ischemia Noted on CT  angio abdomen from March Vascular surgery consulted Cannot do angiography now due to AKI They will continue to follow     DVT prophylaxis: SQ Lovenox Code Status: Full Family Communication: Daughter-in-law Freda Munro [phone] on 5/7 Disposition  Plan: Status is: Inpatient  Remains inpatient appropriate because:Inpatient level of care appropriate due to severity of illness   Dispo: The patient is from: Home              Anticipated d/c is to: Home              Patient currently is not medically stable to d/c.   Difficult to place patient No   Chest pain, shortness of breath of unclear etiology.  Possibly ischemic in nature.  Catheterization planned however on hold due to AKI.  Vascular surgery on consult.  Angiography of her mesenteric vasculature also normal.    Level of care: Progressive Cardiac  Consultants:   Cardiology- [facility]   Procedures: None  Antimicrobials:   None    Subjective: Patient seen and examined.  Respiratory status improved.  Still with some orthopedic issues.  Objective: Vitals:   11/10/2020 0500 11/13/2020 0527 11/06/2020 0844 10/31/2020 1154  BP:  (!) 132/45 (!) 158/62 (!) 131/53  Pulse:  65 80 61  Resp:  18 18 18   Temp:  97.7 F (36.5 C) 97.8 F (36.6 C) (!) 97.4 F (36.3 C)  TempSrc:  Oral Oral Oral  SpO2:  100% 99% 100%  Weight: 123.2 kg     Height:        Intake/Output Summary (Last 24 hours) at 11/04/2020 1350 Last data filed at 11/10/2020 0937 Gross per 24 hour  Intake 870 ml  Output 1050 ml  Net -180 ml   Filed Weights   10/28/20 0500 10/29/20 0500 11/08/2020 0500  Weight: 121.7 kg 121.6 kg 123.2 kg    Examination:  General exam: No acute distress Respiratory system: Bibasilar crackles.  Normal work of breathing.  2 L Cardiovascular system: S1-S2, regular rate and rhythm, no murmurs, nonpitting edema bilaterally Gastrointestinal system: Obese, nontender, nondistended, normal bowel sounds  Central nervous system: Alert and oriented. No focal neurological deficits. Extremities: Decreased range of motion bilateral lower extremities Skin: Bilateral lower extremity lymphedema Psychiatry: Judgement and insight appear normal. Mood & affect appropriate.     Data  Reviewed: I have personally reviewed following labs and imaging studies  CBC: Recent Labs  Lab 11/20/2020 1725 10/28/20 0015 10/28/20 1343 10/23/2020 0342  WBC 4.8 4.8 5.9 5.0  NEUTROABS 2.9  --  3.7 2.9  HGB 9.4* 9.3* 9.5* 9.7*  HCT 29.9* 30.0* 30.1* 32.1*  MCV 88.7 89.6 89.1 93.3  PLT 161 149* 155 [id number]*   Basic Metabolic Panel: Recent Labs  Lab 11/13/2020 1725 10/28/20 0015 10/29/20 0442 11/07/2020 0342  NA 136  --  137 138  K 5.2*  --  4.2 5.0  CL 100  --  100 103  CO2 27  --  26 25  GLUCOSE 210*  --  105* 109*  BUN 63*  --  65* 70*  CREATININE 1.40* 1.39* 1.47* 1.64*  CALCIUM 9.1  --  8.9 8.9   GFR: Estimated Creatinine Clearance: 29.8 mL/min (A) (by C-G formula based on SCr of 1.64 mg/dL (H)). Liver Function Tests: No results for input(s): AST, ALT, ALKPHOS, BILITOT, PROT, ALBUMIN in the last 168 hours. No results for input(s): LIPASE, AMYLASE in the last 168 hours. No results for input(s): AMMONIA in the last 168 hours. Coagulation  Profile: No results for input(s): INR, PROTIME in the last 168 hours. Cardiac Enzymes: No results for input(s): CKTOTAL, CKMB, CKMBINDEX, TROPONINI in the last 168 hours. BNP (last 3 results) No results for input(s): PROBNP in the last 8760 hours. HbA1C: Recent Labs    10/28/20 0015  HGBA1C 6.0*   CBG: Recent Labs  Lab 10/29/20 1224 10/29/20 1628 10/29/20 2055 11/07/2020 0845 11/15/2020 1156  GLUCAP 172* 116* 171* 115* 211*   Lipid Profile: Recent Labs    10/28/20 0015  CHOL 126  HDL 77  LDLCALC 46  TRIG 17  CHOLHDL 1.6   Thyroid Function Tests: No results for input(s): TSH, T4TOTAL, FREET4, T3FREE, THYROIDAB in the last 72 hours. Anemia Panel: No results for input(s): VITAMINB12, FOLATE, FERRITIN, TIBC, IRON, RETICCTPCT in the last 72 hours. Sepsis Labs: Recent Labs  Lab 10/28/20 0532 11/13/2020 0342  PROCALCITON <0.10 <0.10    Recent Results (from the past 240 hour(s))  SARS CORONAVIRUS 2 (TAT 6-24 HRS)  Nasopharyngeal Nasopharyngeal Swab     Status: None   Collection Time: 10/31/2020  5:27 PM   Specimen: Nasopharyngeal Swab  Result Value Ref Range Status   SARS Coronavirus 2 NEGATIVE NEGATIVE Final    Comment: (NOTE) SARS-CoV-2 target nucleic acids are NOT DETECTED.  The SARS-CoV-2 RNA is generally detectable in upper and lower respiratory specimens during the acute phase of infection. Negative results do not preclude SARS-CoV-2 infection, do not rule out co-infections with other pathogens, and should not be used as the sole basis for treatment or other patient management decisions. Negative results must be combined with clinical observations, patient history, and epidemiological information. The expected result is Negative.  Fact Sheet for Patients: SugarRoll.be  Fact Sheet for Healthcare Providers: https://www.woods-mathews.com/  This test is not yet approved or cleared by the [place] FDA and  has been authorized for detection and/or diagnosis of SARS-CoV-2 by FDA under an Emergency Use Authorization (EUA). This EUA will remain  in effect (meaning this test can be used) for the duration of the COVID-19 declaration under Se ction 564(b)(1) of the Act, 21 U.S.C. section 360bbb-3(b)(1), unless the authorization is terminated or revoked sooner.  Performed at [facility], [place] [address] [place], [place] [address]   MRSA PCR Screening     Status: None   Collection Time: 11/20/2020 11:21 PM   Specimen: Nasopharyngeal  Result Value Ref Range Status   MRSA by PCR NEGATIVE NEGATIVE Final    Comment:        The GeneXpert MRSA Assay (FDA approved for NASAL specimens only), is one component of a comprehensive MRSA colonization surveillance program. It is not intended to diagnose MRSA infection nor to guide or monitor treatment for MRSA infections. Performed at [facility], [address], [place], [place] [address]           Radiology Studies: No results found.      Scheduled Meds: . acetaminophen  650 mg Oral BH-q7a  . ALPRAZolam  0.5 mg Oral BID  . budesonide  0.5 mg Nebulization Daily  . capsaicin   Topical BID  . chlorhexidine  15 mL Mouth Rinse BID  . Chlorhexidine Gluconate Cloth  6 each Topical Q0600  . clopidogrel  75 mg Oral Daily  . enoxaparin (LOVENOX) injection  0.5 mg/kg Subcutaneous Q24H  . insulin aspart  0-15 Units Subcutaneous TID AC & HS  . insulin aspart protamine- aspart  15 Units Subcutaneous BID WC  . lidocaine  1 patch  Transdermal Q24H  . mouth rinse  15 mL Mouth Rinse q12n4p  . multivitamin with minerals  1 tablet Oral QODAY  . mupirocin ointment   Topical BID  . pantoprazole  40 mg Oral Daily  . simvastatin  20 mg Oral q1600  . sodium chloride flush  3 mL Intravenous Q12H  . triamcinolone cream   Topical BID   Continuous Infusions: . sodium chloride    . [START ON 10/31/2020] sodium chloride       LOS: 3 days    Time spent: 25 minutes    Sidney Ace, MD [facility] Pager [phone]  If 7PM-7AM, please contact night-coverage 11/15/2020, 1:50 PM

## 2020-10-30 NOTE — Progress Notes (Signed)
Patient Name: Teresa Krueger Date of Encounter: Nov 29, 2020  Hospital Problem List     Principal Problem:   Acute on chronic respiratory failure with hypoxia [facility]) Active Problems:   Diabetes mellitus (HCC)   GERD (gastroesophageal reflux disease)   Morbid obesity ([place])   Benign essential hypertension   Lymphedema   CKD (chronic kidney disease) stage 3, GFR 30-59 ml/min (HCC)   Angina at rest [facility])   CHF (congestive heart failure) [facility])    Patient Profile     Teresa Krueger a 87 y.o.femalewith history oflower extremity lymphedema, history of heart block status post permanent pacemaker, COPD, diabetes, hyperlipidemia, hypertension who presented to the emergency room with increasing shortness of breath. She was scheduled for a outpatient Myoview for next week however the patient canceled it thinking that her lungs would preclude her being able to do this. She has chest pain with both typical atypical features. She states that it radiates from her chest to her back. This appears to be present both at rest and with exertion but she does state that on occasion it worsens with exertion. She denies syncope or presyncope. She is compliant with her medications. She states she had cardiac catheterizations at least twice in the past 1 at [facility] of 1 at [facility]. Cath at [facility] was done in 1988 showing normal coronary arteries. Most recent echo done in December 2021 showed preserved LV function with mild aortic stenosis with a valve area of 1.0 cm by continuity equation. She is resting comfortably at present. Her troponin has peaked at 247. There is another 1 pending. BNP is 952. 6 months ago was 384. EKG showed sinus rhythm with ventricular paced rhythm. Chest x-ray revealed slight left midlung atelectasis. No evidence of congestive heart failure. Pacemaker present. Chest CT revealed no pulmonary embolus. There was mild cardiomegaly with possible mild  congestive heart failure.  Subjective   Still somewhat short of breath.  Chronic chest heaviness.  Somewhat anxious.  Inpatient Medications    . acetaminophen  650 mg Oral BH-q7a  . ALPRAZolam  0.5 mg Oral BID  . budesonide  0.5 mg Nebulization Daily  . capsaicin   Topical BID  . chlorhexidine  15 mL Mouth Rinse BID  . Chlorhexidine Gluconate Cloth  6 each Topical Q0600  . clopidogrel  75 mg Oral Daily  . enoxaparin (LOVENOX) injection  0.5 mg/kg Subcutaneous Q24H  . insulin aspart  0-15 Units Subcutaneous TID AC & HS  . insulin aspart protamine- aspart  15 Units Subcutaneous BID WC  . lidocaine  1 patch Transdermal Q24H  . mouth rinse  15 mL Mouth Rinse q12n4p  . multivitamin with minerals  1 tablet Oral QODAY  . mupirocin ointment   Topical BID  . pantoprazole  40 mg Oral Daily  . potassium chloride SA  20 mEq Oral Daily  . simvastatin  20 mg Oral q1600  . sodium chloride flush  3 mL Intravenous Q12H  . triamcinolone cream   Topical BID    Vital Signs    Vitals:   10/29/20 2000 2020/11/29 0500 11-29-2020 0527 November 29, 2020 0844  BP: (!) 124/58  (!) 132/45 (!) 158/62  Pulse:   65 80  Resp:   18 18  Temp: 97.7 F (36.5 C)  97.7 F (36.5 C) 97.8 F (36.6 C)  TempSrc: Oral  Oral Oral  SpO2: 100%  100% 99%  Weight:  123.2 kg    Height:  Intake/Output Summary (Last 24 hours) at 11/19/2020 0850 Last data filed at 10/29/2020 2100 Gross per 24 hour  Intake 1830 ml  Output 550 ml  Net 1280 ml   Filed Weights   10/28/20 0500 10/29/20 0500 10/28/2020 0500  Weight: 121.7 kg 121.6 kg 123.2 kg    Physical Exam    GEN: Well nourished, well developed, in no acute distress.  HEENT: normal.  Neck: Supple, no JVD, carotid bruits, or masses. Cardiac: RRR, no murmurs, rubs, or gallops. No clubbing, cyanosis, edema.  Radials/DP/PT 2+ and equal bilaterally.  Respiratory:  Respirations regular and unlabored, clear to auscultation bilaterally. GI: Soft, nontender, nondistended, BS + x  4. MS: no deformity or atrophy. Skin: warm and dry, no rash. Neuro:  Strength and sensation are intact. Psych: Normal affect.  Labs    CBC Recent Labs    10/28/20 1343 11/20/2020 0342  WBC 5.9 5.0  NEUTROABS 3.7 2.9  HGB 9.5* 9.7*  HCT 30.1* 32.1*  MCV 89.1 93.3  PLT 155 952*   Basic Metabolic Panel Recent Labs    10/29/20 0442 10/23/2020 0342  NA 137 138  K 4.2 5.0  CL 100 103  CO2 26 25  GLUCOSE 105* 109*  BUN 65* 70*  CREATININE 1.47* 1.64*  CALCIUM 8.9 8.9   Liver Function Tests No results for input(s): AST, ALT, ALKPHOS, BILITOT, PROT, ALBUMIN in the last 72 hours. No results for input(s): LIPASE, AMYLASE in the last 72 hours. Cardiac Enzymes No results for input(s): CKTOTAL, CKMB, CKMBINDEX, TROPONINI in the last 72 hours. BNP Recent Labs    11/18/2020 1725 10/28/20 0532  BNP 593.5* 952.4*   D-Dimer Recent Labs    11/15/2020 1725  DDIMER 1.05*   Hemoglobin A1C Recent Labs    10/28/20 0015  HGBA1C 6.0*   Fasting Lipid Panel Recent Labs    10/28/20 0015  CHOL 126  HDL 77  LDLCALC 46  TRIG 17  CHOLHDL 1.6   Thyroid Function Tests No results for input(s): TSH, T4TOTAL, T3FREE, THYROIDAB in the last 72 hours.  Invalid input(s): FREET3  Telemetry    AV, V pacing  ECG  Ventricular paced rhythm  Radiology    DG Chest 2 View  Result Date: 11/07/2020 CLINICAL DATA:  Shortness of breath EXAM: CHEST - 2 VIEW COMPARISON:  December 07, 2018. FINDINGS: There is no edema or airspace opacity. There is slight atelectasis in the left mid lung. Heart is upper normal in size with pacemaker leads attached to the right atrium and right ventricle. No adenopathy. There is aortic atherosclerosis. There is degenerative change in the thoracic spine. IMPRESSION: Slight left midlung atelectasis. No edema or airspace opacity. Heart upper normal in size with pacemaker leads attached to right atrium and right ventricle. Aortic Atherosclerosis (ICD10-I70.0). Electronically  Signed   By: Lowella Grip III M.D.   On: 11/20/2020 17:08   CT ANGIO CHEST PE W OR WO CONTRAST  Result Date: 10/28/2020 CLINICAL DATA:  87 year old female with concern for pulmonary embolism. EXAM: CT ANGIOGRAPHY CHEST WITH CONTRAST TECHNIQUE: Multidetector CT imaging of the chest was performed using the standard protocol during bolus administration of intravenous contrast. Multiplanar CT image reconstructions and MIPs were obtained to evaluate the vascular anatomy. CONTRAST:  22mL OMNIPAQUE IOHEXOL 350 MG/ML SOLN COMPARISON:  Chest CT dated 02/07/2016. FINDINGS: Cardiovascular: Mild cardiomegaly. No pericardial effusion. There is coronary vascular calcification and calcification of the mitral annulus. Right pectoral pacemaker device. Advanced atherosclerotic calcification of the thoracic aorta. No aneurysmal  dilatation. Evaluation of the pulmonary arteries is limited due to respiratory motion artifact. No large or central pulmonary artery embolus identified. Mediastinum/Nodes: No hilar or mediastinal adenopathy. The esophagus is grossly unremarkable. No mediastinal fluid collection. Lungs/Pleura: There are small bilateral pleural effusions with bibasilar atelectasis. Diffuse interstitial and interlobular septal prominence and patchy bilateral ground-glass opacities consistent with vascular congestion and edema. Scattered ground-glass nodularity may represent edema or pneumonia. Clinical correlation is recommended. No pneumothorax. The central airways are patent. Upper Abdomen: There is a 15 mm hypodense lesion in the right lobe of the liver. Cholecystectomy. Indeterminate subcentimeter left renal hypodense lesion. Musculoskeletal: Osteopenia with scoliosis and degenerative changes of the spine. No acute osseous pathology. Review of the MIP images confirms the above findings. IMPRESSION: 1. No CT evidence of central pulmonary artery embolus. 2. Mild cardiomegaly with findings of CHF and small bilateral  pleural effusions. Scattered ground-glass nodularity may represent edema or pneumonia. Clinical correlation is recommended. 3. Aortic Atherosclerosis (ICD10-I70.0). Electronically Signed   By: Anner Crete M.D.   On: 10/28/2020 01:14    Assessment & Plan     87 y.o.femalewith history oflower extremity lymphedema, history of heart block status post permanent pacemaker, COPD, diabetes, hyperlipidemia, hypertension who presented to the emergency room with increasing shortness of breath. She was scheduled for a outpatient Myoview for next week however the patient canceled it thinking that her lungs would preclude her being able to do this. She has chest pain with both typical atypical features. She states that it radiates from her chest to her back. This appears to be present both at rest and with exertion but she does state that on occasion it worsens with exertion. She denies syncope or presyncope. She is compliant with her medications. She states she had cardiac catheterizations at least twice in the past 1 at [facility] of 1 at [facility]. Cath at [facility] was done in 1988 showing normal coronary arteries. Most recent echo done in December 2021 showed preserved LV function with mild aortic stenosis with a valve area of 1.0 cm by continuity equation. She is resting comfortably at present. Her troponin has peaked at 247. There is another 1 pending. BNP is 952. 6 months ago was 384. EKG showed sinus rhythm with ventricular paced rhythm. Chest x-ray revealed slight left midlung atelectasis. No evidence of congestive heart failure. Pacemaker present. Chest CT revealed no pulmonary embolus. There was mild cardiomegaly with possible mild congestive heart failure.  1. Chest pain-etiology unclear. Mildly elevated serum troponins thus far. This could be secondary to demand due to respiratory failure as well as mild congestive heart failure. Alternatively, given her symptoms  ischemia may be present. We will review subsequent troponin. Patient was scheduled for a functional study which has been canceled by the patient as she does not feel she would be able to go through it in her "respiratory state".  Discussed noninvasive versus invasive treatment with the patient yesterday.  She feels like she may need further evaluation of the coronaries.  Her troponin has peaked at the 300 range but has remained flat.  This does not appear to have been an acute coronary event however likely is demand ischemia in the face of respiratory distress.  Certainly the patient may have progression of her coronary disease.  Cardiac catheterizations in the distant past have not shown any significant disease.  As mentioned above, she is currently scheduled for cardiac catheterization in the morning however her renal function has continued to worsen somewhat.  Her creatinine October 19, 2020 as an outpatient was 1.1.  Is now 1.49.  I have stopped her IV Lasix.  Based on her renal function in the morning, may need to defer cardiac catheterization although creatinine is increased again today to 1.6.  No further contrast studies are warranted at present given relative ischemic stability.  She received contrast on admission for her CTA and repeat contrast burden may cause worsening renal insufficiency especially given her probable volume depleted state.   2. Lymphedema-chronic.   3. Heart block-has a permanent pacemaker which is ventricular pacing. Functioning normally.  4. COPD-continue with bronchodilators,  5. Diabetes-continue with current therapy  6. Hyperlipidemia-continue with statins.  7.  Renal insufficiency-creatinine was 1.1 on October 11, 2020.    Received contrast on admission.    Creatinine increased to 1.64 from 1.47.  Baseline is 1.1.  Will need to defer any contrast studies further until this can be stabilized.  Currently not on any afterload reduction or diuretics.  8.   Abdominal discomfort-patient states she had some postprandial abdominal pain at home.  She states it is better here.  She is desiring further work-up.  Appears stable at present.  Signed, Javier Docker Nicandro Perrault MD 11/09/2020, 8:50 AM  Pager: [phone]

## 2020-10-30 NOTE — Evaluation (Signed)
Physical Therapy Evaluation Patient Details Name: Teresa Krueger MRN: [id number] DOB: [birth year] Today's Date: 10/25/2020   History of Present Illness  Pt is an 87 y/o F admitted on 11/12/2020 with c/c of SOB over the last week, pain on L side of neck & back, & BLE edema. Pt being treated for acute decompensated diastolic CHF & hypoxic respiratory failure. PMH: CAD, chronic lymphadema, COPD, CKD stage 3, HLD, HTN, DM, morbid obesity  Clinical Impression  Pt seen for PT evaluation. Pt received in bed with nasal cannula off but replaces it; pt endorses unrated chest tightness at beginning of session but reports it's improved with use of O2 & MD & nurse cleared pt for participation in session. Pt requires mod assist for bed mobility with use of HOB elevated & bed rails - of note pt does sleep in recliner at home. Pt is able to complete sit>stand from elevated EOB with min assist to power to standing but otherwise able to complete stand pivot to recliner on R with RW & supervision. Pt requires cuing to hold to RW with hand vs leaning on it with R forearm (pt notes she does this 2/2 rotator cuff injury). At this time, recommending supervision for mobility upon d/c home. Will continue to follow pt acutely to progress mobility as able.     Follow Up Recommendations Home health PT;Supervision for mobility/OOB    Equipment Recommendations  None recommended by PT    Recommendations for Other Services       Precautions / Restrictions Precautions Precautions: Fall Restrictions Weight Bearing Restrictions: No      Mobility  Bed Mobility Overal bed mobility: Needs Assistance Bed Mobility: Supine to Sit     Supine to sit: Mod assist;HOB elevated     General bed mobility comments: significantly extra time & assist to transfer to sitting EOB (to move BLE to EOB & to upright trunk)    Transfers Overall transfer level: Needs assistance Equipment used: Rolling walker (2 wheeled) Transfers: Sit  to/from [facility] Sit to Stand: Min assist;From elevated surface Stand pivot transfers: Supervision       General transfer comment: Pt requires min assist to power up to standing  Ambulation/Gait                Stairs            Wheelchair Mobility    Modified Rankin (Stroke Patients Only)       Balance Overall balance assessment: Needs assistance Sitting-balance support: Feet supported;No upper extremity supported Sitting balance-Leahy Scale: Good     Standing balance support: Bilateral upper extremity supported Standing balance-Leahy Scale: Fair                               Pertinent Vitals/Pain Pain Assessment:  (c/o unrated chest tightness) Pain Location: chest Pain Descriptors / Indicators: Tightness Pain Intervention(s): Monitored during session (notified MD & nurse who cleared pt for particpation in session)    Home Living Family/patient expects to be discharged to:: Private residence Living Arrangements: Alone Available Help at Discharge: Family;Available PRN/intermittently;Personal care attendant (Personal aide 9A-1P Mon-Fri, son lives close by & checks on pt frequently but works 12 hr shifts) Type of Home: House Home Access: University: One level (recessed den but has ramp access to it) Home Equipment: Environmental consultant - 4 wheels;Bedside commode;Shower seat;Grab bars - toilet;Grab bars - tub/shower;Wheelchair - power;Adaptive  equipment;Other (comment)      Prior Function Level of Independence: Needs assistance   Gait / Transfers Assistance Needed: Pt is MOD I with rollator in the home for short distances. Primarily sits up in her office chair. Power w/c for community mobility. Has adaptable Lucianne Lei she can drive with ramped access.  ADL's / Homemaking Assistance Needed: aide M-F 9A-1P to help with breakfast/lunch and dinner meal prep. Assists with getting pt into/out of shower. Pt has cleaning person come  once/month  Comments: sleeps in lift chair     Hand Dominance        Extremity/Trunk Assessment   Upper Extremity Assessment Upper Extremity Assessment: RUE deficits/detail;LUE deficits/detail RUE Deficits / Details: torn rotator cuff, limited shld ROM at baseline to ~1/2 range LUE Deficits / Details: ROM WFL, MMT grossly 4-/5    Lower Extremity Assessment Lower Extremity Assessment: Generalized weakness    Cervical / Trunk Assessment Cervical / Trunk Assessment: Kyphotic  Communication   Communication: No difficulties  Cognition Arousal/Alertness: Awake/alert Behavior During Therapy: WFL for tasks assessed/performed Overall Cognitive Status: Within Functional Limits for tasks assessed                                        General Comments General comments (skin integrity, edema, etc.): Vitals sitting in recliner: HR 75 bpm, SPO2 100%, BP 144/46 mmHg in RUE (MAP 72); after transferring to recliner HR 85 bpm, SpO2 98%; pt on 4L/min via nasal cannula during session    Exercises     Assessment/Plan    PT Assessment Patient needs continued PT services  PT Problem List Decreased strength;Decreased mobility;Decreased activity tolerance;Cardiopulmonary status limiting activity;Decreased balance;Decreased knowledge of use of DME       PT Treatment Interventions DME instruction;Therapeutic activities;Modalities;Gait training;Therapeutic exercise;Patient/family education;Stair training;Balance training;Functional mobility training;Neuromuscular re-education;Manual techniques    PT Goals (Current goals can be found in the Care Plan section)  Acute Rehab PT Goals Patient Stated Goal: to go home and be able to age in place PT Goal Formulation: With patient Time For Goal Achievement: 11/13/20 Potential to Achieve Goals: Good    Frequency Min 2X/week   Barriers to discharge Decreased caregiver support lives alone    Co-evaluation                AM-PAC PT "6 Clicks" Mobility  Outcome Measure Help needed turning from your back to your side while in a flat bed without using bedrails?: A Lot Help needed moving from lying on your back to sitting on the side of a flat bed without using bedrails?: A Lot Help needed moving to and from a bed to a chair (including a wheelchair)?: A Little Help needed standing up from a chair using your arms (e.g., wheelchair or bedside chair)?: A Little Help needed to walk in hospital room?: A Little Help needed climbing 3-5 steps with a railing? : A Lot 6 Click Score: 15    End of Session Equipment Utilized During Treatment: Oxygen Activity Tolerance: Patient tolerated treatment well Patient left: in chair;with call bell/phone within reach;with chair alarm set;with family/visitor present Nurse Communication:  (c/o pain) PT Visit Diagnosis: Difficulty in walking, not elsewhere classified (R26.2);Muscle weakness (generalized) (M62.81)    Time: 2993-7169 PT Time Calculation (min) (ACUTE ONLY): 24 min   Charges:   PT Evaluation $PT Eval Moderate Complexity: 1 Mod  Lavone Nian, PT, DPT 10/25/2020, 9:47 AM   Waunita Schooner 10/31/2020, 9:45 AM

## 2020-10-30 NOTE — Consult Note (Signed)
   Heart Failure Nurse Navigator Note  HFpEF 55%.  Moderate LVH.  Mild tricuspid insufficiency.  Mild aortic stenosis.  Mild left atrial enlargement.  Mild mitral regurgitation.   Comorbidities:  Coronary artery disease COPD Chronic kidney disease stage III Hyperlipidemia Hypertension Diabetes Morbid obesity Arthritis  Chronic use of O2 at night at 2 L.  Medications:  Plavix 75 mg daily Potassium chloride 20 mEq daily Zocor 20 mg daily  Furosemide is currently on hold.  Along with losartan 25 mg daily  Labs:  Sodium 138, potassium 5, chloride 103, CO2 25, BUN 70, creatinine 1.64 up from 1.47 yesterday. Weight is 123.2 yesterday's documented weight was 121.6 Intake 1 830 mL Output 550 mL   Assessment:  General-she is awake and alert sitting up in the chair at bedside, currently in no acute distress  HEENT- pulls are equal unable to evaluate for JVD due to body habitus  Cardiac-heart tones of regular rate and rhythm  Abdomen-rounded soft nontender  Musculoskeletal-lower extremity edema noted  Psych -is pleasant and appropriate talkative.  Neurologic-speech clear.   Initial meeting with the patient, she states that she weighs herself on a daily basis and records.  She has a Dietitian that comes into her home 5 days a week from Monday through Friday from 9 AM to 1 PM.  She helps her with her daily cares and meals.  She does admit to eating homemade soup made with broth, discussed using low sodium broth.  Also states that she is trying to eat more fresh fruits and vegetables, she has no salt crackers, and does not use salt at the table.  She has also trying to read more labels, discussed keeping her sodium intake less than 2000 mg daily.  Discussed  daily weights and when to notify physician of weight gain.  States that she does wear thigh-high compression wraps for the lymphedema and is doing treatments twice a day felt that she got too much fluid into her  abdomen and chest causing shortness of breath she now just does it once a day and 1 leg at a time.  She does not sleep in a bed due to her arthritis is hard for her to get up during the night to go to the bathroom.  She does sleep in a lift chair  Was given the living with heart failure teaching booklet along with the zone magnet information and an appointment time with the heart failure clinic.   Pricilla Riffle RN [facility]

## 2020-10-31 ENCOUNTER — Encounter: Payer: Medicare Other | Admitting: Occupational Therapy

## 2020-10-31 DIAGNOSIS — J9621 Acute and chronic respiratory failure with hypoxia: Secondary | ICD-10-CM | POA: Diagnosis not present

## 2020-10-31 DIAGNOSIS — R079 Chest pain, unspecified: Secondary | ICD-10-CM | POA: Diagnosis not present

## 2020-10-31 DIAGNOSIS — R0600 Dyspnea, unspecified: Secondary | ICD-10-CM | POA: Diagnosis not present

## 2020-10-31 DIAGNOSIS — R062 Wheezing: Secondary | ICD-10-CM | POA: Diagnosis not present

## 2020-10-31 LAB — CBC WITH DIFFERENTIAL/PLATELET
Abs Immature Granulocytes: 0.01 10*3/uL (ref 0.00–0.07)
Basophils Absolute: 0 10*3/uL (ref 0.0–0.1)
Basophils Relative: 1 %
Eosinophils Absolute: 1 10*3/uL — ABNORMAL HIGH (ref 0.0–0.5)
Eosinophils Relative: 20 %
HCT: 30.7 % — ABNORMAL LOW (ref 36.0–46.0)
Hemoglobin: 9.4 g/dL — ABNORMAL LOW (ref 12.0–15.0)
Immature Granulocytes: 0 %
Lymphocytes Relative: 14 %
Lymphs Abs: 0.7 10*3/uL (ref 0.7–4.0)
MCH: 27.5 pg (ref 26.0–34.0)
MCHC: 30.6 g/dL (ref 30.0–36.0)
MCV: 89.8 fL (ref 80.0–100.0)
Monocytes Absolute: 0.6 10*3/uL (ref 0.1–1.0)
Monocytes Relative: 11 %
Neutro Abs: 2.9 10*3/uL (ref 1.7–7.7)
Neutrophils Relative %: 54 %
Platelets: 138 10*3/uL — ABNORMAL LOW (ref 150–400)
RBC: 3.42 MIL/uL — ABNORMAL LOW (ref 3.87–5.11)
RDW: 13.2 % (ref 11.5–15.5)
WBC: 5.2 10*3/uL (ref 4.0–10.5)
nRBC: 0 % (ref 0.0–0.2)

## 2020-10-31 LAB — BASIC METABOLIC PANEL
Anion gap: 10 (ref 5–15)
BUN: 73 mg/dL — ABNORMAL HIGH (ref 8–23)
CO2: 27 mmol/L (ref 22–32)
Calcium: 9 mg/dL (ref 8.9–10.3)
Chloride: 102 mmol/L (ref 98–111)
Creatinine, Ser: 1.59 mg/dL — ABNORMAL HIGH (ref 0.44–1.00)
GFR, Estimated: 32 mL/min — ABNORMAL LOW (ref >60)
Glucose, Bld: 96 mg/dL (ref 70–99)
Potassium: 4.8 mmol/L (ref 3.5–5.1)
Sodium: 139 mmol/L (ref 135–145)

## 2020-10-31 LAB — GLUCOSE, CAPILLARY
Glucose-Capillary: 286 mg/dL — ABNORMAL HIGH (ref 70–99)
Glucose-Capillary: 320 mg/dL — ABNORMAL HIGH (ref 70–99)
Glucose-Capillary: 269 mg/dL — ABNORMAL HIGH (ref 70–99)

## 2020-10-31 MED ORDER — METHYLPREDNISOLONE SODIUM SUCC 40 MG IJ SOLR
40.0000 mg | Freq: Two times a day (BID) | INTRAMUSCULAR | Status: DC
  Administered 2020-10-31 – 2020-11-01 (×3): 40 mg via INTRAVENOUS
  Filled 2020-10-31 (×3): qty 1

## 2020-10-31 MED ORDER — INSULIN ASPART 100 UNIT/ML IJ SOLN
0.0000 [IU] | Freq: Three times a day (TID) | INTRAMUSCULAR | Status: DC
  Administered 2020-10-31: 15 [IU] via SUBCUTANEOUS
  Administered 2020-11-01: 11 [IU] via SUBCUTANEOUS
  Administered 2020-11-01: 15 [IU] via SUBCUTANEOUS
  Administered 2020-11-01: 11 [IU] via SUBCUTANEOUS
  Administered 2020-11-02: 3 [IU] via SUBCUTANEOUS
  Filled 2020-10-31 (×5): qty 1

## 2020-10-31 MED ORDER — AZITHROMYCIN 250 MG PO TABS
500.0000 mg | ORAL_TABLET | Freq: Every day | ORAL | Status: DC
  Administered 2020-10-31 – 2020-11-01 (×2): 500 mg via ORAL
  Filled 2020-10-31 (×2): qty 2

## 2020-10-31 MED ORDER — IPRATROPIUM-ALBUTEROL 0.5-2.5 (3) MG/3ML IN SOLN
3.0000 mL | Freq: Four times a day (QID) | RESPIRATORY_TRACT | Status: DC
  Administered 2020-11-01 – 2020-11-03 (×11): 3 mL via RESPIRATORY_TRACT
  Filled 2020-10-31 (×11): qty 3

## 2020-10-31 MED ORDER — BUDESONIDE 0.5 MG/2ML IN SUSP
0.5000 mg | Freq: Two times a day (BID) | RESPIRATORY_TRACT | Status: DC
  Administered 2020-10-31 – 2020-11-05 (×11): 0.5 mg via RESPIRATORY_TRACT
  Filled 2020-10-31 (×10): qty 2

## 2020-10-31 MED ORDER — SIMETHICONE 80 MG PO CHEW
80.0000 mg | CHEWABLE_TABLET | Freq: Four times a day (QID) | ORAL | Status: DC | PRN
  Administered 2020-10-31: 80 mg via ORAL
  Filled 2020-10-31 (×2): qty 1

## 2020-10-31 MED ORDER — IPRATROPIUM-ALBUTEROL 0.5-2.5 (3) MG/3ML IN SOLN
3.0000 mL | RESPIRATORY_TRACT | Status: DC
  Administered 2020-10-31 (×4): 3 mL via RESPIRATORY_TRACT
  Filled 2020-10-31 (×3): qty 3

## 2020-10-31 MED ORDER — METHOCARBAMOL 500 MG PO TABS
500.0000 mg | ORAL_TABLET | Freq: Three times a day (TID) | ORAL | Status: DC | PRN
  Administered 2020-11-01: 500 mg via ORAL
  Filled 2020-10-31 (×3): qty 1

## 2020-10-31 NOTE — Progress Notes (Signed)
PROGRESS NOTE    Teresa Krueger  LAG:[id number] DOB: 08/28/1933 DOA: 10/30/2020 PCP: Lynnea Ferrier, MD  Brief Narrative:   87 y.o. female with medical history significant of coronary artery disease, chronic lymphedema, COPD, chronic kidney disease stage III, hyperlipidemia, hypertension, diabetes and morbid obesity who was being worked up for angina at home.  Patient was supposed to have a cardiac stress test this week.  She started having chest pressure today shortness of breath.  The shortness of breath has been progressive over the last week.  Currently present even at rest without exertion.  She has pain that is been going on in the left side going to the neck and the back.  It is more pressure that is worse with exertion.  Patient also has noted progressive worsening of leg swelling.  She has cough and frothy sputum.  No fever or chills.  No sick contact.  Patient uses oxygen only at night at about 2 L.  Patient also recently was set up with a Holter monitor which she completed but the results are still pending.  She came to the ER where she was found to be hypoxic evidence of fluid overload as well as anxiety.  Patient progressively got worse in the ER and now is on BiPAP.  She is hypoxic and appears to have evidence of anasarca.  She has been admitted to the hospital for angina with acute on chronic respiratory failure with hypoxia.  5/7: Patient weaned from BiPAP.  Now on 4 L supplemental oxygen saturating 100%.  Still anasarca ache with evidence of decompensated failure.  Cardiology, Dr. Lady Gary consulted.  5/8: Patient weaned successfully from BiPAP.  Respiratory status improving.  Still with some mild anasarca.  Cardiology stopped Lasix due to concern for AKI in anticipation of contrast exposure during this admission.  5/9: Creatinine continues to worsen.  Cardiac catheterization deferred.  Volume status little unclear at the moment.  5/10: Kidney function slightly improved from  yesterday.  Cardiology considering catheterization during this admission.  Defer plan to them.  Patient continues to endorse some cough and shortness of breath associated with small-volume hemoptysis.   Assessment & Plan:   Principal Problem:   Acute on chronic respiratory failure with hypoxia (HCC) Active Problems:   Diabetes mellitus (HCC)   GERD (gastroesophageal reflux disease)   Morbid obesity (HCC)   Benign essential hypertension   Lymphedema   CKD (chronic kidney disease) stage 3, GFR 30-59 ml/min (HCC)   Angina at rest [facility])   CHF (congestive heart failure) (HCC)  Acute decompensated diastolic congestive heart failure Acute hypoxic respiratory failure secondary to above Patient is endorsing shortness of breath over the past week Was unable to go to her scheduled for stress test Required BiPAP in ED now weaned off Plan: Cardiology consulted, case discussed with Dr. Lady Gary Lasix on hold in setting of acute kidney injury Strict I's and O's Daily weights Heart healthy diet Monitor and replace electrolytes.K>4; Mg>2 Follow any further cardiac recommendations Possible ischemic evaluation during this admission Catheterization currently on hold given AKI on CKD  Shortness of breath Small-volume hemoptysis Chest CT does demonstrate some edema Also some groundglass opacities concerning for atypical infection Procalcitonin negative Plan: DuoNebs every 4 hours Pulmicort twice daily IV Solu-Medrol 40 mg every 12 hours Azithromycin 500 mg daily x3 days Patient is known to Dr. Karna Christmas, consider inpatient consult if respiratory status remains an issue  AKI on CKD stage IIIa Creatinine continues to worsen Possible CIN Patient  got contrast load on admission for CT angio 5/10: Creatinine slightly improved Plan: Continue to hold Lasix and ACE inhibitor Avoid any nonessential nephrotoxins Catheterization currently on hold as kidney function is preoperative  Chest  pain Elevated troponin Could be a result of decompensated failure No ischemic changes on EKG Plan: Telemetry monitoring Trend troponins Possible cardiac catheterization during this admission.  Plan currently limited by acute kidney injury  Insulin-dependent diabetes mellitus Well controlled at home Plan: Insulin 70/30 15 units twice daily per home dose Sliding scale coverage  Hypertension Hyperlipidemia Continue home Cozaar Continue home statin  Coronary artery disease Continue antiplatelet therapy  GERD PPI  Possible mesenteric ischemia Noted on CT angio abdomen from March Vascular surgery consulted Cannot do angiography now due to AKI Patient is tolerating p.o. intake with minimal symptoms No plans for inpatient angiography Vascular follow-up as outpatient     DVT prophylaxis: SQ Lovenox Code Status: Full Family Communication: Daughter-in-law Freda Munro [phone] on 5/10 Disposition Plan: Status is: Inpatient  Remains inpatient appropriate because:Inpatient level of care appropriate due to severity of illness   Dispo: The patient is from: Home              Anticipated d/c is to: Home              Patient currently is not medically stable to d/c.   Difficult to place patient No   Chest pain, shortness of breath of unclear etiology.  Possibly ischemic in nature.  Plan for catheterization currently deferred due to AKI.  No plans for intervention from vascular standpoint.  Attempting management of possible atypical pulmonary infection.    Level of care: Progressive Cardiac  Consultants:   Cardiology- [facility]   Procedures: None  Antimicrobials:   None    Subjective: Patient seen and examined.  Respiratory status overall improved.  Objective: Vitals:   10/31/20 0814 10/31/20 0827 10/31/20 1038 10/31/20 1250  BP: (!) 125/33     Pulse: 63     Resp: 18     Temp: 97.6 F (36.4 C)     TempSrc: Oral     SpO2: 100% 97% 97% 96%  Weight:       Height:        Intake/Output Summary (Last 24 hours) at 10/31/2020 1318 Last data filed at 10/31/2020 1202 Gross per 24 hour  Intake 757.06 ml  Output 750 ml  Net 7.06 ml   Filed Weights   10/29/20 0500 10/24/2020 0500 10/31/20 0500  Weight: 121.6 kg 123.2 kg 122.5 kg    Examination:  General exam: No acute distress Respiratory system: Bibasilar crackles.  Normal work of breathing.  2 L Cardiovascular system: S1-S2 heard, regular rate and rhythm, no murmurs, nonpitting lower extremity edema Gastrointestinal system: Obese, nontender, nondistended, normal bowel sounds  Central nervous system: Alert and oriented. No focal neurological deficits. Extremities: Decreased range of motion bilateral lower extremities Skin: Bilateral lower extremity lymphedema Psychiatry: Judgement and insight appear normal. Mood & affect appropriate.     Data Reviewed: I have personally reviewed following labs and imaging studies  CBC: Recent Labs  Lab 11/17/2020 1725 10/28/20 0015 10/28/20 1343 11/16/2020 0342 10/31/20 0507  WBC 4.8 4.8 5.9 5.0 5.2  NEUTROABS 2.9  --  3.7 2.9 2.9  HGB 9.4* 9.3* 9.5* 9.7* 9.4*  HCT 29.9* 30.0* 30.1* 32.1* 30.7*  MCV 88.7 89.6 89.1 93.3 89.8  PLT 161 149* 155 130* 466*   Basic Metabolic Panel: Recent Labs  Lab 11/12/2020 1725 10/28/20  0015 10/29/20 0442 Nov 21, 2020 0342 10/31/20 0507  NA 136  --  137 138 139  K 5.2*  --  4.2 5.0 4.8  CL 100  --  100 103 102  CO2 27  --  26 25 27   GLUCOSE 210*  --  105* 109* 96  BUN 63*  --  65* 70* 73*  CREATININE 1.40* 1.39* 1.47* 1.64* 1.59*  CALCIUM 9.1  --  8.9 8.9 9.0   GFR: Estimated Creatinine Clearance: 30.6 mL/min (A) (by C-G formula based on SCr of 1.59 mg/dL (H)). Liver Function Tests: No results for input(s): AST, ALT, ALKPHOS, BILITOT, PROT, ALBUMIN in the last 168 hours. No results for input(s): LIPASE, AMYLASE in the last 168 hours. No results for input(s): AMMONIA in the last 168 hours. Coagulation  Profile: No results for input(s): INR, PROTIME in the last 168 hours. Cardiac Enzymes: No results for input(s): CKTOTAL, CKMB, CKMBINDEX, TROPONINI in the last 168 hours. BNP (last 3 results) No results for input(s): PROBNP in the last 8760 hours. HbA1C: No results for input(s): HGBA1C in the last 72 hours. CBG: Recent Labs  Lab 10/29/20 2055 10/29/2020 0845 11/14/2020 1156 21-Nov-2020 1648 Nov 21, 2020 2138  GLUCAP 171* 115* 211* 106* 102*   Lipid Profile: No results for input(s): CHOL, HDL, LDLCALC, TRIG, CHOLHDL, LDLDIRECT in the last 72 hours. Thyroid Function Tests: No results for input(s): TSH, T4TOTAL, FREET4, T3FREE, THYROIDAB in the last 72 hours. Anemia Panel: No results for input(s): VITAMINB12, FOLATE, FERRITIN, TIBC, IRON, RETICCTPCT in the last 72 hours. Sepsis Labs: Recent Labs  Lab 10/28/20 0532 21-Nov-2020 0342  PROCALCITON <0.10 <0.10    Recent Results (from the past 240 hour(s))  SARS CORONAVIRUS 2 (TAT 6-24 HRS) Nasopharyngeal Nasopharyngeal Swab     Status: None   Collection Time: 11/14/2020  5:27 PM   Specimen: Nasopharyngeal Swab  Result Value Ref Range Status   SARS Coronavirus 2 NEGATIVE NEGATIVE Final    Comment: (NOTE) SARS-CoV-2 target nucleic acids are NOT DETECTED.  The SARS-CoV-2 RNA is generally detectable in upper and lower respiratory specimens during the acute phase of infection. Negative results do not preclude SARS-CoV-2 infection, do not rule out co-infections with other pathogens, and should not be used as the sole basis for treatment or other patient management decisions. Negative results must be combined with clinical observations, patient history, and epidemiological information. The expected result is Negative.  Fact Sheet for Patients: SugarRoll.be  Fact Sheet for Healthcare Providers: https://www.woods-mathews.com/  This test is not yet approved or cleared by the [place] FDA and  has  been authorized for detection and/or diagnosis of SARS-CoV-2 by FDA under an Emergency Use Authorization (EUA). This EUA will remain  in effect (meaning this test can be used) for the duration of the COVID-19 declaration under Se ction 564(b)(1) of the Act, 21 U.S.C. section 360bbb-3(b)(1), unless the authorization is terminated or revoked sooner.  Performed at [facility], [place] [address], [place],  [address]   MRSA PCR Screening     Status: None   Collection Time: 10/25/2020 11:21 PM   Specimen: Nasopharyngeal  Result Value Ref Range Status   MRSA by PCR NEGATIVE NEGATIVE Final    Comment:        The GeneXpert MRSA Assay (FDA approved for NASAL specimens only), is one component of a comprehensive MRSA colonization surveillance program. It is not intended to diagnose MRSA infection nor to guide or monitor treatment for MRSA infections. Performed at [facility],  [place], [place] [address]          Radiology Studies: No results found.      Scheduled Meds: . acetaminophen  650 mg Oral BH-q7a  . ALPRAZolam  0.5 mg Oral BID  . azithromycin  500 mg Oral Daily  . budesonide (PULMICORT) nebulizer solution  0.5 mg Nebulization BID  . capsaicin   Topical BID  . chlorhexidine  15 mL Mouth Rinse BID  . Chlorhexidine Gluconate Cloth  6 each Topical Q0600  . clopidogrel  75 mg Oral Daily  . enoxaparin (LOVENOX) injection  0.5 mg/kg Subcutaneous Q24H  . insulin aspart  0-15 Units Subcutaneous TID AC & HS  . insulin aspart protamine- aspart  15 Units Subcutaneous BID WC  . ipratropium-albuterol  3 mL Nebulization Q4H  . lidocaine  1 patch Transdermal Q24H  . mouth rinse  15 mL Mouth Rinse q12n4p  . methylPREDNISolone (SOLU-MEDROL) injection  40 mg Intravenous Q12H  . multivitamin with minerals  1 tablet Oral QODAY  . mupirocin ointment   Topical BID  . pantoprazole  40 mg Oral Daily  . simvastatin  20 mg Oral q1600  . sodium chloride  flush  3 mL Intravenous Q12H  . triamcinolone cream   Topical BID   Continuous Infusions: . sodium chloride    . sodium chloride Stopped (10/31/20 0934)     LOS: 4 days    Time spent: 25 minutes    Sidney Ace, MD [facility] Pager [phone]  If 7PM-7AM, please contact night-coverage 10/31/2020, 1:18 PM

## 2020-10-31 NOTE — Progress Notes (Signed)
Physical Therapy Treatment Patient Details Name: Teresa Krueger MRN: [id number] DOB: 10/24/1933 Today's Date: 10/31/2020    History of Present Illness Teresa Krueger is an 87 y/o F admitted on 11/13/2020 with c/c of SOB over the last week, pain on L side of neck & back, & BLE edema. Pt being treated for acute decompensated diastolic CHF & hypoxic respiratory failure. PMH: CAD, chronic lymphadema, COPD, CKD stage 3, HLD, HTN, DM, morbid obesity. Cardiac cath on 5/10 canceled due to concerns of kidney function.    PT Comments    Pt in bed upon entry, donning [place], reports O2 off only briefly to allow for the blowing of the nose, but SpO2: 99%. Pt moved to 2L/min throughout session and at exit, pt anxious 2/2 CP, but educated on matching O2 needs with saturation. Pt at 97% or higher remainder of session. Pt also anxious about chronic leg pain and author touching them. Pt participates in gentle bed level exercises, mostly partial A/ROM of bilat, limited to reps of 5 due to level of anxiety and CP. Pt left in bed, mobility deferred. RN aware of flow rate lowering and patient concerning.      Follow Up Recommendations  Home health PT;Supervision for mobility/OOB     Equipment Recommendations  None recommended by PT    Recommendations for Other Services       Precautions / Restrictions Precautions Precautions: Fall Restrictions Weight Bearing Restrictions: No    Mobility  Bed Mobility               General bed mobility comments: deferred due to anxiety and constant substernal CP    Transfers                    Ambulation/Gait                 Stairs             Wheelchair Mobility    Modified Rankin (Stroke Patients Only)       Balance                                            Cognition Arousal/Alertness: Awake/alert Behavior During Therapy: WFL for tasks assessed/performed Overall Cognitive Status: Within Functional Limits  for tasks assessed                                 General Comments: fairly anxious .      Exercises Low Level/ICU Exercises Ankle Circles/Pumps: AROM;Both;5 reps;Supine Quad Sets: AROM;Both;10 reps;Supine Hip ABduction/ADduction: AROM;10 reps;Both Heel Slides: AROM;AAROM;Both;10 reps;Limitations Heel Slides Limitations: requires AA/ROM on Left    General Comments        Pertinent Vitals/Pain Pain Score:  (Pt hates this scale) Faces Pain Scale: Hurts even more (This scale "means nothing" to the patient") Pain Location: constant aching tighenss across the center chest Lt midclavicular line to Right mid clavicular line T4/5 level; Pain Descriptors / Indicators: Tightness;Aching Pain Intervention(s): Limited activity within patient's tolerance;Premedicated before session;Monitored during session    Home Living                      Prior Function            PT Goals (current goals can now be found in the  care plan section) Acute Rehab PT Goals Patient Stated Goal: to go home and be able to age in place PT Goal Formulation: With patient Time For Goal Achievement: 11/13/20 Potential to Achieve Goals: Good Progress towards PT goals: Not progressing toward goals - comment    Frequency    Min 2X/week      PT Plan Current plan remains appropriate    Co-evaluation              AM-PAC PT "6 Clicks" Mobility   Outcome Measure  Help needed turning from your back to your side while in a flat bed without using bedrails?: A Lot Help needed moving from lying on your back to sitting on the side of a flat bed without using bedrails?: A Lot Help needed moving to and from a bed to a chair (including a wheelchair)?: A Lot Help needed standing up from a chair using your arms (e.g., wheelchair or bedside chair)?: A Lot Help needed to walk in hospital room?: Total Help needed climbing 3-5 steps with a railing? : Total 6 Click Score: 10    End of  Session Equipment Utilized During Treatment: Oxygen Activity Tolerance: Patient tolerated treatment well Patient left: in chair;with call bell/phone within reach;with chair alarm set;with family/visitor present Nurse Communication:  (anxious about O2 flow rate) PT Visit Diagnosis: Difficulty in walking, not elsewhere classified (R26.2);Muscle weakness (generalized) (M62.81)     Time: 0350-0938 PT Time Calculation (min) (ACUTE ONLY): 29 min  Charges:  $Therapeutic Exercise: 23-37 mins                     10:49 AM, 10/31/20 Etta Grandchild, PT, DPT Physical Therapist - [facility]  [phone] ([place])   [place] 10/31/2020, 10:46 AM

## 2020-10-31 NOTE — TOC Initial Note (Signed)
Transition of Care [facility]) - Initial/Assessment Note    Patient Details  Name: Teresa Krueger MRN: [id number] Date of Birth: 01/01/1934  Transition of Care [facility]) CM/SW Contact:    Kerin Salen, RN Phone Number: 10/31/2020, 1:38 PM  Clinical Narrative:  Spoke with patient who says her husband died 68mo. Ago. She lives alone with a Helper that comes Mon-Fri. 9am-1pm, who assist with housework, ADL's and cooking. Patient states she uses wheelchair, also has a rollator and mobility Lucianne Lei for transportation. Son, Marlou Sa lives within 2 miles, assist with weekend meals, picking up medications from [place]. And pick up groceries that she order online. On Oxygen 4L at night, however PCP put in an order for her to also use during the day. Son also transports to medical visits. Patient consents to [facility] PT/OT as recommended, given list of agencies, prefer [facility]. Corene Cornea from [facility] notified will speak with patient today about services. Will continue to track for discharge needs.                Expected Discharge Plan: [place] Barriers to Discharge: Continued Medical Work up   Patient Goals and CMS Choice Patient states their goals for this hospitalization and ongoing recovery are:: To return home with home health. CMS Medicare.gov Compare Post Acute Care list provided to:: Patient Choice offered to / list presented to : Patient  Expected Discharge Plan and Services Expected Discharge Plan: [place] Choice: [place] arrangements for the past 2 months: [place]: PT,OT,RN,Nurse's Aide [place]: [place] ([place]) Date [place]: 10/31/20 Time HH Agency Contacted: 1100 Representative spoke with at [place]: Floydene Flock  Prior Living Arrangements/Services Living arrangements for the past 2 months: [place] with::  Self Patient language and need for interpreter reviewed:: Yes Do you feel safe going back to the place where you live?: Yes      Need for Family Participation in Patient Care: Yes (Comment) Care giver support system in place?: Yes (comment) Current home services: Other (comment) (Helper Mon-Fir. 9am-1pm.) Criminal Activity/Legal Involvement Pertinent to Current Situation/Hospitalization: No - Comment as needed  Activities of Daily Living Home Assistive Devices/Equipment: Eyeglasses,Bedside commode/3-in-1,Wheelchair ADL Screening (condition at time of admission) Patient's cognitive ability adequate to safely complete daily activities?: Yes Is the patient deaf or have difficulty hearing?: No Does the patient have difficulty seeing, even when wearing glasses/contacts?: Yes (reading glasses) Does the patient have difficulty concentrating, remembering, or making decisions?: No Patient able to express need for assistance with ADLs?: Yes Does the patient have difficulty dressing or bathing?: Yes Independently performs ADLs?: No Communication: Independent Dressing (OT): Needs assistance Is this a change from baseline?: Pre-admission baseline Grooming: Needs assistance Is this a change from baseline?: Pre-admission baseline Feeding: Needs assistance (Set-up) Is this a change from baseline?: Pre-admission baseline Bathing: Needs assistance Is this a change from baseline?: Pre-admission baseline Toileting: Independent In/Out Bed: Independent Walks in Home: Independent with device (comment) (uses wheelchair) Does the patient have difficulty walking or climbing stairs?: Yes Weakness of Legs: Both Weakness of Arms/Hands: None  Permission Sought/Granted                  Emotional Assessment Appearance:: Appears stated  age Attitude/Demeanor/Rapport: Engaged Affect (typically observed): Accepting Orientation: : Oriented to Self,Oriented to Place,Oriented to  Time,Oriented to  Situation Alcohol / Substance Use: Not Applicable Psych Involvement: No (comment)  Admission diagnosis:  CHF (congestive heart failure) (HCC) [I50.9] Dyspnea on exertion [R06.00] Angina at rest [facility]) [I20.8] Chest pain, unspecified type [R07.9] Patient Active Problem List   Diagnosis Date Noted  . Angina at rest [facility]) 2020/11/24  . CHF (congestive heart failure) ([place]) 24-Nov-2020  . Acute on chronic respiratory failure with hypoxia ([place]) 24-Nov-2020  . Pedal edema 05/02/2020  . Lightheadedness 03/20/2018  . Syncope 03/09/2018  . CKD (chronic kidney disease) stage 3, GFR 30-59 ml/min (HCC) 02/14/2017  . Carotid artery stenosis 10/29/2016  . TIA (transient ischemic attack) 10/14/2016  . Chronic respiratory failure with hypoxia ([place]) 05/24/2016  . Aortic stenosis, mild 05/02/2016  . SOB (shortness of breath) on exertion 05/02/2016  . Swelling of limb 04/10/2016  . Varicose veins of leg with pain, bilateral 04/10/2016  . Lymphedema 04/10/2016  . Osteoarthritis 03/19/2016  . Osteoporosis 03/19/2016  . Coronary artery calcification seen on CAT scan 02/08/2016  . Incidental lung nodule 02/08/2016  . Thoracic aortic atherosclerosis ([place]) 02/08/2016  . S/P placement of cardiac pacemaker 03/30/2015  . Morbid obesity ([place]) 03/17/2015  . Hypersomnia with sleep apnea 03/17/2015  . Mobitz type 2 second degree AV block 03/16/2015  . Multiple pulmonary nodules 09/13/2014  . Lung field abnormal 09/13/2014  . Barrett's esophagus 12/21/2013  . History of adenomatous polyp of colon 12/21/2013  . Hx of adenomatous colonic polyps 12/21/2013  . Benign essential hypertension 11/30/2013  . Acromioclavicular joint arthritis 11/23/2013  . Glenohumeral arthritis 11/23/2013  . Chest pain 10/21/2013  . Adult body mass index 50.0-59.9 ([place]) 02/09/2013  . Morbid obesity with BMI of 50.0-59.9, adult ([place]) 02/09/2013  . Primary localized osteoarthrosis, lower leg 02/09/2013  . Bronchiectasis ([place])  05/19/2012  . Chronic bronchitis 12/31/2011  . Cough 12/31/2011  . GERD (gastroesophageal reflux disease) 12/31/2011  . Diabetes mellitus ([place])   . Hypertension    PCP:  Adin Hector, MD Pharmacy:   [facility] [address]), [place] [address] [place] ([place]) [place] [address] Phone: [phone] Fax: [place], [place] - [place] [address] Phone: [phone] Fax: [phone]     Social Determinants of Health (SDOH) Interventions    Readmission Risk Interventions No flowsheet data found.

## 2020-10-31 NOTE — Progress Notes (Signed)
Patient Name: Teresa Krueger Date of Encounter: 10/31/2020  Hospital Problem List     Principal Problem:   Acute on chronic respiratory failure with hypoxia [facility]) Active Problems:   Diabetes mellitus ([place])   GERD (gastroesophageal reflux disease)   Morbid obesity ([place])   Benign essential hypertension   Lymphedema   CKD (chronic kidney disease) stage 3, GFR 30-59 ml/min (HCC)   Angina at rest [facility])   CHF (congestive heart failure) [facility])    Patient Profile       Teresa Krueger a 87 y.o.femalewith history oflower extremity lymphedema, history of heart block status post permanent pacemaker, COPD, diabetes, hyperlipidemia, hypertension who presented to the emergency room with increasing shortness of breath. She was scheduled for a outpatient Myoview for next week however the patient canceled it thinking that her lungs would preclude her being able to do this. She has chest pain with both typical atypical features. She states that it radiates from her chest to her back. This appears to be present both at rest and with exertion but she does state that on occasion it worsens with exertion. She denies syncope or presyncope. She is compliant with her medications. She states she had cardiac catheterizations at least twice in the past 1 at [facility] of 1 at [facility]. Cath at [facility] was done in 1988 showing normal coronary arteries. Most recent echo done in December 2021 showed preserved LV function with mild aortic stenosis with a valve area of 1.0 cm by continuity equation. She is resting comfortably at present. Her troponin has peaked at 247. There is another 1 pending. BNP is 952. 6 months ago was 384. EKG showed sinus rhythm with ventricular paced rhythm. Chest x-ray revealed slight left midlung atelectasis. No evidence of congestive heart failure. Pacemaker present. Chest CT revealed no pulmonary embolus. There was mild cardiomegaly with possible mild  congestive heart failure.  Subjective   Sob  And chest tightness   Inpatient Medications    . acetaminophen  650 mg Oral BH-q7a  . ALPRAZolam  0.5 mg Oral BID  . budesonide  0.5 mg Nebulization Daily  . capsaicin   Topical BID  . chlorhexidine  15 mL Mouth Rinse BID  . Chlorhexidine Gluconate Cloth  6 each Topical Q0600  . clopidogrel  75 mg Oral Daily  . enoxaparin (LOVENOX) injection  0.5 mg/kg Subcutaneous Q24H  . insulin aspart  0-15 Units Subcutaneous TID AC & HS  . insulin aspart protamine- aspart  15 Units Subcutaneous BID WC  . lidocaine  1 patch Transdermal Q24H  . mouth rinse  15 mL Mouth Rinse q12n4p  . multivitamin with minerals  1 tablet Oral QODAY  . mupirocin ointment   Topical BID  . pantoprazole  40 mg Oral Daily  . simvastatin  20 mg Oral q1600  . sodium chloride flush  3 mL Intravenous Q12H  . triamcinolone cream   Topical BID    Vital Signs    Vitals:   11/08/2020 1646 11/07/2020 2000 10/31/20 0357 10/31/20 0500  BP: (!) 140/46 (!) 140/43 (!) 130/40   Pulse: 76 78 67   Resp: 18 16 18    Temp: (!) 97.5 F (36.4 C) (!) 97.5 F (36.4 C) 98 F (36.7 C)   TempSrc: Oral Oral Oral   SpO2: 100% 100% 100%   Weight:    122.5 kg  Height:        Intake/Output Summary (Last 24 hours) at 10/31/2020 0732 Last data  filed at 10/31/2020 0600 Gross per 24 hour  Intake 480 ml  Output 1250 ml  Net -770 ml   Filed Weights   10/29/20 0500 11/17/2020 0500 10/31/20 0500  Weight: 121.6 kg 123.2 kg 122.5 kg    Physical Exam    GEN: Well nourished, well developed, in no acute distress.  HEENT: normal.  Neck: Supple, no JVD, carotid bruits, or masses. Cardiac: RRR, lower extremity lymphedema Respiratory:  Respirations regular and unlabored, clear to auscultation bilaterally. GI: Soft, nontender, nondistended, BS + x 4. MS: no deformity or atrophy. Skin: warm and dry, no rash. Neuro:  Strength and sensation are intact. Psych: Normal affect.  Labs    CBC Recent  Labs    11/04/2020 0342 10/31/20 0507  WBC 5.0 5.2  NEUTROABS 2.9 2.9  HGB 9.7* 9.4*  HCT 32.1* 30.7*  MCV 93.3 89.8  PLT 130* 0000000*   Basic Metabolic Panel Recent Labs    11/18/2020 0342 10/31/20 0507  NA 138 139  K 5.0 4.8  CL 103 102  CO2 25 27  GLUCOSE 109* 96  BUN 70* 73*  CREATININE 1.64* 1.59*  CALCIUM 8.9 9.0   Liver Function Tests No results for input(s): AST, ALT, ALKPHOS, BILITOT, PROT, ALBUMIN in the last 72 hours. No results for input(s): LIPASE, AMYLASE in the last 72 hours. Cardiac Enzymes No results for input(s): CKTOTAL, CKMB, CKMBINDEX, TROPONINI in the last 72 hours. BNP No results for input(s): BNP in the last 72 hours. D-Dimer No results for input(s): DDIMER in the last 72 hours. Hemoglobin A1C No results for input(s): HGBA1C in the last 72 hours. Fasting Lipid Panel No results for input(s): CHOL, HDL, LDLCALC, TRIG, CHOLHDL, LDLDIRECT in the last 72 hours. Thyroid Function Tests No results for input(s): TSH, T4TOTAL, T3FREE, THYROIDAB in the last 72 hours.  Invalid input(s): FREET3  Telemetry    Av paced  ECG    V paced.   Radiology    DG Chest 2 View  Result Date: 11/05/2020 CLINICAL DATA:  Shortness of breath EXAM: CHEST - 2 VIEW COMPARISON:  December 07, 2018. FINDINGS: There is no edema or airspace opacity. There is slight atelectasis in the left mid lung. Heart is upper normal in size with pacemaker leads attached to the right atrium and right ventricle. No adenopathy. There is aortic atherosclerosis. There is degenerative change in the thoracic spine. IMPRESSION: Slight left midlung atelectasis. No edema or airspace opacity. Heart upper normal in size with pacemaker leads attached to right atrium and right ventricle. Aortic Atherosclerosis (ICD10-I70.0). Electronically Signed   By: Lowella Grip III M.D.   On: 11/12/2020 17:08   CT ANGIO CHEST PE W OR WO CONTRAST  Result Date: 10/28/2020 CLINICAL DATA:  87 year old female with concern  for pulmonary embolism. EXAM: CT ANGIOGRAPHY CHEST WITH CONTRAST TECHNIQUE: Multidetector CT imaging of the chest was performed using the standard protocol during bolus administration of intravenous contrast. Multiplanar CT image reconstructions and MIPs were obtained to evaluate the vascular anatomy. CONTRAST:  34mL OMNIPAQUE IOHEXOL 350 MG/ML SOLN COMPARISON:  Chest CT dated 02/07/2016. FINDINGS: Cardiovascular: Mild cardiomegaly. No pericardial effusion. There is coronary vascular calcification and calcification of the mitral annulus. Right pectoral pacemaker device. Advanced atherosclerotic calcification of the thoracic aorta. No aneurysmal dilatation. Evaluation of the pulmonary arteries is limited due to respiratory motion artifact. No large or central pulmonary artery embolus identified. Mediastinum/Nodes: No hilar or mediastinal adenopathy. The esophagus is grossly unremarkable. No mediastinal fluid collection. Lungs/Pleura: There are small  bilateral pleural effusions with bibasilar atelectasis. Diffuse interstitial and interlobular septal prominence and patchy bilateral ground-glass opacities consistent with vascular congestion and edema. Scattered ground-glass nodularity may represent edema or pneumonia. Clinical correlation is recommended. No pneumothorax. The central airways are patent. Upper Abdomen: There is a 15 mm hypodense lesion in the right lobe of the liver. Cholecystectomy. Indeterminate subcentimeter left renal hypodense lesion. Musculoskeletal: Osteopenia with scoliosis and degenerative changes of the spine. No acute osseous pathology. Review of the MIP images confirms the above findings. IMPRESSION: 1. No CT evidence of central pulmonary artery embolus. 2. Mild cardiomegaly with findings of CHF and small bilateral pleural effusions. Scattered ground-glass nodularity may represent edema or pneumonia. Clinical correlation is recommended. 3. Aortic Atherosclerosis (ICD10-I70.0). Electronically  Signed   By: Anner Crete M.D.   On: 10/28/2020 01:14    Assessment & Plan    87 y.o.femalewith history oflower extremity lymphedema, history of heart block status post permanent pacemaker, COPD, diabetes, hyperlipidemia, hypertension who presented to the emergency room with increasing shortness of breath. She was scheduled for a outpatient Myoview for next week however the patient canceled it thinking that her lungs would preclude her being able to do this. She has chest pain with both typical atypical features. She states that it radiates from her chest to her back. This appears to be present both at rest and with exertion but she does state that on occasion it worsens with exertion. She denies syncope or presyncope. She is compliant with her medications. She states she had cardiac catheterizations at least twice in the past 1 at [facility] 1 at [facility]. Cath at [facility] was done in 1988 showing normal coronary arteries. Most recent echo done in December 2021 showed preserved LV function with mild aortic stenosis with a valve area of 1.0 cm by continuity equation. She is resting comfortably at present. Her troponin has peaked at 247. There is another 1 pending. BNP is 952. 6 months ago was 384. EKG showed sinus rhythm with ventricular paced rhythm. Chest x-ray revealed slight left midlung atelectasis. No evidence of congestive heart failure. Pacemaker present. Chest CT revealed no pulmonary embolus. There was mild cardiomegaly with possible mild congestive heart failure.  1. Chest pain-etiology unclear. Mildly elevated serum troponins thus far. This could be secondary to demand due to respiratory failure as well as mild congestive heart failure. Alternatively, given her symptoms ischemia may be present. We will review subsequent troponin. Patient was scheduled for a functional study which has been canceledby the patient as she does not feel she would be  able to go through it in her "respiratory state".Discussed noninvasive versus invasive treatment with the patient yesterday. She feels like she may need further evaluation of the coronaries. Her troponin has peaked at the 300 range but has remained flat. This does not appear to have been an acute coronary event however likely is demand ischemia in the face of respiratory distress. Certainly the patient may have progression of her coronary disease. Cardiac catheterizations in the distant past have not shown any significant disease. As mentioned above, she is currently scheduled for cardiac catheterization in the morning however her renal function has continued to worsen somewhat. Her creatinine October 19, 2020 as an outpatient was 1.1. Is now 1.59.   No further contrast studies are warranted at present given relative ischemic stability. She received contrast on admission for her CTA and repeat contrast burden may cause worsening renal insufficiency especially given her probable volume depleted  state. Will continue medical management and consideration for further ischemic workup will be undertaken as her renal function and pulmonary function improves.   2. Lymphedema-chronic.   3. Heart block-has a permanent pacemaker which is ventricular pacing. Functioning normally.  4. COPD-continue with bronchodilators,  5. Diabetes-continue with current therapy  6. Hyperlipidemia-continue with statins.  7. Renal insufficiency-creatinine was 1.1 on October 11, 2020.  Received contrast on admission.   Creatinine improved somewhat to 1.59 from 1.64 yesterday. .  Baseline is 1.1.  Will continue current medical therapy. Will defer any further contrast studies at present.   8. Abdominal discomfort-patient states she had some postprandial abdominal pain at home. She states it is better here.   Signed, Javier Docker Merlean Pizzini MD 10/31/2020, 7:32 AM  Pager: [phone]

## 2020-10-31 NOTE — Progress Notes (Signed)
Occupational Therapy Treatment Patient Details Name: Teresa Krueger MRN: [id number] DOB: July 04, 1933 Today's Date: 10/31/2020    History of present illness Teresa Krueger is an 87 y/o F admitted on 11/13/20 with c/c of SOB over the last week, pain on L side of neck & back, & BLE edema. Pt being treated for acute decompensated diastolic CHF & hypoxic respiratory failure. PMH: CAD, chronic lymphadema, COPD, CKD stage 3, HLD, HTN, DM, morbid obesity. Cardiac cath on 5/10 canceled due to concerns of kidney function.   OT comments  Teresa Krueger presents today with generalized weakness, reduced endurance, SOB, and pain, limiting her ability to engage in functional mobility tasks. She reports pain/tightness across chest, as well as 3/10 pain in b/l LE at rest, spiking to 6/10 with movement/touch. Seated in recliner, pt's HR is 79, SpO2 is 99% on 2L [place]. Within 10 seconds of standing with RW, HR climbs to 109, SpO2 drops to 91%, and chest pain increases to 7/10. Pt returns to recliner after short ambulation in room, requires ~ 3 minutes for HR to drop back to <90 bpm, and ~ 7 minutes for chest pain to subside. Discussed need for pt to have additional help (perhaps 24 hr care) at home and ways in which pt may be able to find and pay for such care. Provided education re: falls prevention, energy conservation, importance of OOB activity. Engaged in seated therex, grooming, bathing, dressing. Pt somewhat anxious, therapist provided therapeutic listening and educ about availability of community services potentially beneficial to pt. Pt is adamant she wants to return home, yet her mobility is quite limited and she is serious falls risk. Recommend HHOT as well as additional non-medical supervision at home, increasing from 7 days/wk (up from current 5 days/wk), and in both morning and evenings.   Follow Up Recommendations  Home health OT;Supervision - Intermittent    Equipment Recommendations  None recommended by OT     Recommendations for Other Services      Precautions / Restrictions Precautions Precautions: Fall Restrictions Weight Bearing Restrictions: No       Mobility Bed Mobility Overal bed mobility: Needs Assistance             General bed mobility comments: pt received in recliner    Transfers Overall transfer level: Needs assistance Equipment used: Rolling walker (2 wheeled) Transfers: Sit to/from [facility] Sit to Stand: Min guard Stand pivot transfers: Min guard       General transfer comment: Utilizes rocking movement to move sit<stand    Balance Overall balance assessment: Needs assistance Sitting-balance support: Feet supported;No upper extremity supported Sitting balance-Leahy Scale: Good     Standing balance support: Bilateral upper extremity supported Standing balance-Leahy Scale: Good Standing balance comment: Standing balance with RW good; however, pt becomes quickly SOB in standing                           ADL either performed or assessed with clinical judgement   ADL Overall ADL's : Needs assistance/impaired     Grooming: Set up;Moderate assistance;Wash/dry face;Oral care;Brushing hair;Wash/dry hands;Sitting Grooming Details (indicate cue type and reason): requires mod A 2/2 pain in b/l UE Upper Body Bathing: Moderate assistance;Sitting       Upper Body Dressing : Minimal assistance   Lower Body Dressing: Maximal assistance   Toilet Transfer: Moderate assistance;RW;BSC  Vision Baseline Vision/History: Wears glasses Wears Glasses: At all times Patient Visual Report: No change from baseline     Perception     Praxis      Cognition Arousal/Alertness: Awake/alert Behavior During Therapy: WFL for tasks assessed/performed Overall Cognitive Status: Within Functional Limits for tasks assessed                                          Exercises Total Joint  Exercises Ankle Circles/Pumps: AROM;15 reps;Seated Quad Sets: AROM;10 reps;Seated Towel Squeeze: AROM;10 reps;Seated Other Exercises Other Exercises: Educ re: importance of OOB activity, falls prevention, DC and HH options, DME/AE   Shoulder Instructions       General Comments      Pertinent Vitals/ Pain       Faces Pain Scale: Hurts even more Pain Location: tightness across chest; pain in b/l LE 2/2 edema Pain Descriptors / Indicators: Tightness;Aching;Pressure;Throbbing;Squeezing Pain Intervention(s): Limited activity within patient's tolerance;Monitored during session;Repositioned  Home Living                                          Prior Functioning/Environment              Frequency  Min 1X/week        Progress Toward Goals  OT Goals(current goals can now be found in the care plan section)  Progress towards OT goals: Progressing toward goals  Acute Rehab OT Goals Patient Stated Goal: to go home and be able to age in place OT Goal Formulation: With patient Time For Goal Achievement: 11/12/20 Potential to Achieve Goals: Good  Plan Discharge plan remains appropriate    Co-evaluation                 AM-PAC OT "6 Clicks" Daily Activity     Outcome Measure   Help from another person eating meals?: None Help from another person taking care of personal grooming?: A Little Help from another person toileting, which includes using toliet, bedpan, or urinal?: A Little Help from another person bathing (including washing, rinsing, drying)?: A Little Help from another person to put on and taking off regular upper body clothing?: A Little Help from another person to put on and taking off regular lower body clothing?: A Lot 6 Click Score: 18    End of Session Equipment Utilized During Treatment: Rolling walker;Oxygen  OT Visit Diagnosis: Unsteadiness on feet (R26.81);Muscle weakness (generalized) (M62.81);Pain   Activity Tolerance  Patient tolerated treatment well   Patient Left in chair;with call bell/phone within reach;with nursing/sitter in room   Nurse Communication          Time: 6387-5643 OT Time Calculation (min): 70 min  Charges: OT General Charges $OT Visit: 1 Visit OT Treatments $Self Care/Home Management : 38-52 mins $Therapeutic Activity: 8-22 mins $Therapeutic Exercise: 8-22 mins  Josiah Lobo, PhD, MS, OTR/L 10/31/20, 4:12 PM

## 2020-10-31 NOTE — Progress Notes (Signed)
[facility] Daily Progress Note  Subjective: Since yesterday, the patient has eaten breakfast, lunch and dinner end-stage experiencing some discomfort to the left lower quadrant once.  Patient denies any nausea or vomiting associated with eating any of her meals.  She also denied experiencing any changes in bowel habits.  Objective: Vitals:   10/31/20 0500 10/31/20 0814 10/31/20 0827 10/31/20 1038  BP:  (!) 125/33    Pulse:  63    Resp:  18    Temp:  97.6 F (36.4 C)    TempSrc:  Oral    SpO2:  100% 97% 97%  Weight: 122.5 kg     Height:        Intake/Output Summary (Last 24 hours) at 10/31/2020 1213 Last data filed at 10/31/2020 1202 Gross per 24 hour  Intake 757.06 ml  Output 750 ml  Net 7.06 ml   Physical Exam: A&Ox3, NAD CV: RRR Pulmonary: CTA Bilaterally Abdomen: Soft, Nontender, Nondistended Vascular: Warm distally toes/good capillary refill   Laboratory: CBC    Component Value Date/Time   WBC 5.2 10/31/2020 0507   HGB 9.4 (L) 10/31/2020 0507   HGB 13.9 03/18/2012 1341   HCT 30.7 (L) 10/31/2020 0507   HCT 43.1 03/18/2012 1341   PLT 138 (L) 10/31/2020 0507   PLT 192 03/18/2012 1341   BMET    Component Value Date/Time   NA 139 10/31/2020 0507   NA 141 03/18/2012 1341   K 4.8 10/31/2020 0507   K 4.9 03/18/2012 1341   CL 102 10/31/2020 0507   CL 104 03/18/2012 1341   CO2 27 10/31/2020 0507   CO2 29 03/18/2012 1341   GLUCOSE 96 10/31/2020 0507   GLUCOSE 64 (L) 03/18/2012 1341   BUN 73 (H) 10/31/2020 0507   BUN 34 (H) 03/18/2012 1341   CREATININE 1.59 (H) 10/31/2020 0507   CREATININE 1.40 (H) 04/06/2012 1359   CALCIUM 9.0 10/31/2020 0507   CALCIUM 9.4 03/18/2012 1341   GFRNONAA 32 (L) 10/31/2020 0507   GFRNONAA 36 (L) 04/06/2012 1359   GFRAA 54 (L) 07/19/2019 1612   GFRAA 42 (L) 04/06/2012 1359   Assessment/Planning: The patient is an 87 year old female with multiple medical issues who presented with chest pressure and shortness  of breath incidental finding of SMA stenosis  1) Since our last visit, the patient has had the opportunity to eat breakfast, lunch and dinner. She reports no postprandial pain, nausea, or vomiting after eating. She notes one minor discomfort to the left lower quadrant.  2) At this time, the patient does not seem to be symptomatic.  No vascular intervention indicated at this time.  Patient needs to undergo cardiac cath etc. and should preserve her renal function for that.   3) We are happy to continue to see the patient in the outpatient setting to reassess her mesentery patency.  Discussed with Dr. Ellis Parents Teresa Madding PA-C 10/31/2020 12:13 PM

## 2020-11-01 ENCOUNTER — Inpatient Hospital Stay: Payer: Medicare Other

## 2020-11-01 ENCOUNTER — Encounter: Admission: EM | Disposition: E | Payer: Self-pay | Source: Home / Self Care | Attending: Internal Medicine

## 2020-11-01 ENCOUNTER — Ambulatory Visit: Payer: Medicare Other

## 2020-11-01 ENCOUNTER — Inpatient Hospital Stay
Admit: 2020-11-01 | Discharge: 2020-11-01 | Disposition: A | Discharge status: Home / Self Care | Payer: Medicare Other | Attending: Cardiology | Admitting: Cardiology

## 2020-11-01 ENCOUNTER — Inpatient Hospital Stay: Admit: 2020-11-01 | Payer: Medicare Other

## 2020-11-01 DIAGNOSIS — R0602 Shortness of breath: Secondary | ICD-10-CM | POA: Diagnosis not present

## 2020-11-01 DIAGNOSIS — R079 Chest pain, unspecified: Secondary | ICD-10-CM | POA: Diagnosis not present

## 2020-11-01 DIAGNOSIS — J9621 Acute and chronic respiratory failure with hypoxia: Secondary | ICD-10-CM | POA: Diagnosis not present

## 2020-11-01 LAB — ECHOCARDIOGRAM COMPLETE
AR max vel: 0.94 cm2
AV Area VTI: 0.86 cm2
AV Area mean vel: 0.96 cm2
AV Mean grad: 15.7 mmHg
AV Peak grad: 27.2 mmHg
Ao pk vel: 2.61 m/s
Area-P 1/2: 5.58 cm2
Height: 59 in
S' Lateral: 3.55 cm
Weight: 4412.73 oz

## 2020-11-01 LAB — EXPECTORATED SPUTUM ASSESSMENT W GRAM STAIN, RFLX TO RESP C

## 2020-11-01 LAB — BRAIN NATRIURETIC PEPTIDE: B Natriuretic Peptide: 1119 pg/mL — ABNORMAL HIGH (ref 0.0–100.0)

## 2020-11-01 LAB — CBC WITH DIFFERENTIAL/PLATELET
Abs Immature Granulocytes: 0.03 10*3/uL (ref 0.00–0.07)
Basophils Absolute: 0 10*3/uL (ref 0.0–0.1)
Basophils Relative: 0 %
Eosinophils Absolute: 0 10*3/uL (ref 0.0–0.5)
Eosinophils Relative: 0 %
HCT: 34.7 % — ABNORMAL LOW (ref 36.0–46.0)
Hemoglobin: 10.6 g/dL — ABNORMAL LOW (ref 12.0–15.0)
Immature Granulocytes: 1 %
Lymphocytes Relative: 3 %
Lymphs Abs: 0.2 10*3/uL — ABNORMAL LOW (ref 0.7–4.0)
MCH: 27.7 pg (ref 26.0–34.0)
MCHC: 30.5 g/dL (ref 30.0–36.0)
MCV: 90.6 fL (ref 80.0–100.0)
Monocytes Absolute: 0.1 10*3/uL (ref 0.1–1.0)
Monocytes Relative: 1 %
Neutro Abs: 5.5 10*3/uL (ref 1.7–7.7)
Neutrophils Relative %: 95 %
Platelets: 179 10*3/uL (ref 150–400)
RBC: 3.83 MIL/uL — ABNORMAL LOW (ref 3.87–5.11)
RDW: 13.2 % (ref 11.5–15.5)
WBC: 5.8 10*3/uL (ref 4.0–10.5)
nRBC: 0 % (ref 0.0–0.2)

## 2020-11-01 LAB — BASIC METABOLIC PANEL
Anion gap: 12 (ref 5–15)
BUN: 73 mg/dL — ABNORMAL HIGH (ref 8–23)
CO2: 24 mmol/L (ref 22–32)
Calcium: 9.2 mg/dL (ref 8.9–10.3)
Chloride: 100 mmol/L (ref 98–111)
Creatinine, Ser: 1.57 mg/dL — ABNORMAL HIGH (ref 0.44–1.00)
GFR, Estimated: 32 mL/min — ABNORMAL LOW (ref >60)
Glucose, Bld: 269 mg/dL — ABNORMAL HIGH (ref 70–99)
Potassium: 5.1 mmol/L (ref 3.5–5.1)
Sodium: 136 mmol/L (ref 135–145)

## 2020-11-01 LAB — GLUCOSE, CAPILLARY
Glucose-Capillary: 251 mg/dL — ABNORMAL HIGH (ref 70–99)
Glucose-Capillary: 329 mg/dL — ABNORMAL HIGH (ref 70–99)
Glucose-Capillary: 258 mg/dL — ABNORMAL HIGH (ref 70–99)
Glucose-Capillary: 214 mg/dL — ABNORMAL HIGH (ref 70–99)

## 2020-11-01 LAB — TROPONIN I (HIGH SENSITIVITY)
Troponin I (High Sensitivity): 196 ng/L (ref <18)
Troponin I (High Sensitivity): 310 ng/L (ref <18)

## 2020-11-01 SURGERY — VISCERAL ANGIOGRAPHY
Anesthesia: Moderate Sedation

## 2020-11-01 MED ORDER — FENTANYL CITRATE (PF) 100 MCG/2ML IJ SOLN
12.5000 ug | Freq: Once | INTRAMUSCULAR | Status: AC
  Administered 2020-11-01: 12.5 ug via INTRAVENOUS
  Filled 2020-11-01: qty 2

## 2020-11-01 MED ORDER — INSULIN ASPART PROT & ASPART (70-30 MIX) 100 UNIT/ML ~~LOC~~ SUSP
12.0000 [IU] | Freq: Two times a day (BID) | SUBCUTANEOUS | Status: DC
  Administered 2020-11-01 – 2020-11-02 (×2): 12 [IU] via SUBCUTANEOUS
  Filled 2020-11-01 (×2): qty 10

## 2020-11-01 MED ORDER — FUROSEMIDE 10 MG/ML IJ SOLN
80.0000 mg | Freq: Once | INTRAMUSCULAR | Status: AC
  Administered 2020-11-01: 80 mg via INTRAVENOUS
  Filled 2020-11-01: qty 8

## 2020-11-01 MED ORDER — FUROSEMIDE 10 MG/ML IJ SOLN
40.0000 mg | Freq: Every day | INTRAMUSCULAR | Status: DC
  Administered 2020-11-01 – 2020-11-02 (×2): 40 mg via INTRAVENOUS
  Filled 2020-11-01 (×2): qty 4

## 2020-11-01 NOTE — Progress Notes (Signed)
Patient placed back on BIPap by this RN. Patient expressing need for BiPap as well as increased work of breathing. Patient continues to have elevated anxiety which was treated with scheduled Xanax. Patient coughing up blood/mucus quarter size. MD Broadus John made aware via secure chat. Patient expressed desire for staff to contact Son Marlou Sa. I will contact him within the hour.  Patient's Purewick was not working and voided large amount in the bed. Unmeasured urine documented.  Patient bed and linens changed, resting in bed with BiPap at this time.

## 2020-11-01 NOTE — Progress Notes (Signed)
Occupational Therapy Treatment Patient Details Name: SHAINA GULLATT MRN: [id number] DOB: 1934/05/21 Today's Date: 11/11/2020    History of present illness Elizabeht Suto is an 87 y/o F admitted on 10/29/2020 with c/c of SOB over the last week, pain on L side of neck & back, & BLE edema. Pt being treated for acute decompensated diastolic CHF & hypoxic respiratory failure. PMH: CAD, chronic lymphadema, COPD, CKD stage 3, HLD, HTN, DM, morbid obesity. Cardiac cath on 5/10 canceled due to concerns of kidney function.   OT comments  Pt seen for OT tx this date to f/u re: strengthening as it applies to safe ADLs/ADL mobility. OT engages pt in knee bends x5 reps per side, QS x5 reps per side, assisted shld flexion with pt's L assisting R for 5 reps and GS for 5 reps all at bed level as pt too fatigued and declines getting OOB this session. Pt does report pain relief from moving LEs. Requires some MIN A for knee bends in the bed d/t pain. Pt left with all needs met and in reach. Will continue to follow. RN aware of session contents.    Follow Up Recommendations  Home health OT;Supervision - Intermittent    Equipment Recommendations       Recommendations for Other Services      Precautions / Restrictions Precautions Precautions: Fall Restrictions Weight Bearing Restrictions: No       Mobility Bed Mobility               General bed mobility comments: deferred, pt stating she had a rough night and required Bi-Pap for a good portion of this day per RN    Transfers                 General transfer comment: deferred    Balance                                           ADL either performed or assessed with clinical judgement   ADL Overall ADL's : Needs assistance/impaired     Grooming: Brushing hair;Moderate assistance;Bed level Grooming Details (indicate cue type and reason): high fowlers                                     Vision  Baseline Vision/History: Wears glasses Patient Visual Report: No change from baseline     Perception     Praxis      Cognition Arousal/Alertness: Awake/alert Behavior During Therapy: WFL for tasks assessed/performed Overall Cognitive Status: Within Functional Limits for tasks assessed                                 General Comments: fairly anxious .        Exercises Other Exercises Other Exercises: OT engages pt in knee bends x5 reps per side, QS x5 reps per side, assisted shld flexion with pt's L assisting R for 5 reps and GS for 5 reps all at bed level as pt too fatigued and declines getting OOB this session. Pt does report pain relief from moving LEs. Requires some MIN A for knee bends in the bed d/t pain.   Shoulder Instructions       General Comments  Pertinent Vitals/ Pain       Pain Assessment: Faces Faces Pain Scale: Hurts a little bit Pain Location: knees Pain Descriptors / Indicators: Sore Pain Intervention(s): Limited activity within patient's tolerance;Monitored during session  Home Living                                          Prior Functioning/Environment              Frequency  Min 1X/week        Progress Toward Goals  OT Goals(current goals can now be found in the care plan section)  Progress towards OT goals: Progressing toward goals  Acute Rehab OT Goals Patient Stated Goal: to go home and be able to age in place OT Goal Formulation: With patient Time For Goal Achievement: 11/12/20 Potential to Achieve Goals: Good  Plan Discharge plan remains appropriate    Co-evaluation                 AM-PAC OT "6 Clicks" Daily Activity     Outcome Measure   Help from another person eating meals?: None Help from another person taking care of personal grooming?: A Little Help from another person toileting, which includes using toliet, bedpan, or urinal?: A Little Help from another person bathing  (including washing, rinsing, drying)?: A Little Help from another person to put on and taking off regular upper body clothing?: A Little Help from another person to put on and taking off regular lower body clothing?: A Lot 6 Click Score: 18    End of Session Equipment Utilized During Treatment: Oxygen  OT Visit Diagnosis: Unsteadiness on feet (R26.81);Muscle weakness (generalized) (M62.81);Pain   Activity Tolerance Patient tolerated treatment well   Patient Left with call bell/phone within reach;in bed;with bed alarm set   Nurse Communication Other (comment) (O2 status, difficult to get accurate reading, but pt in no distress at time of OT tx.)        Time: 1540-1605 OT Time Calculation (min): 25 min  Charges: OT General Charges $OT Visit: 1 Visit OT Treatments $Self Care/Home Management : 8-22 mins $Therapeutic Exercise: 8-22 mins  Gerrianne Scale, [place], OTR/L ascom [phone] 11/17/2020, 6:13 PM

## 2020-11-01 NOTE — Progress Notes (Addendum)
Son updated by phone call  by this RN at this time.

## 2020-11-01 NOTE — Progress Notes (Signed)
PT Cancellation Note  Patient Details Name: Teresa Krueger MRN: [id number] DOB: 18-May-1934   Cancelled Treatment:     Pt received echocardiogram a few hours ago, c/o increased anxiety and difficulty breathing, pt requested BIPap.  She is  currently resting after receiving Xanax, will continue PT next session if pt agreeable and able to tolerate.    Josie Dixon 11/14/2020, 12:38 PM

## 2020-11-01 NOTE — Progress Notes (Signed)
*  PRELIMINARY RESULTS* Echocardiogram 2D Echocardiogram has been performed.  Sherrie Sport 10/25/2020, 10:04 AM

## 2020-11-01 NOTE — Progress Notes (Signed)
Inpatient Diabetes Program Recommendations  AACE/ADA: New Consensus Statement on Inpatient Glycemic Control  Target Ranges:  Prepandial:   less than 140 mg/dL      Peak postprandial:   less than 180 mg/dL (1-2 hours)      Critically ill patients:  140 - 180 mg/dL   Results for Teresa Krueger, Teresa Krueger (MRN [id number]) as of 11/20/2020 09:14  Ref. Range 10/31/2020 13:20 10/31/2020 16:36 10/31/2020 20:24 10/26/2020 07:39  Glucose-Capillary Latest Ref Range: 70 - 99 mg/dL 286 (H) 320 (H) 269 (H) 251 (H)   Review of Glycemic Control  Diabetes history: DM2 Outpatient Diabetes medications: Humalog 75/25 10-15 units BID Current orders for Inpatient glycemic control: 70/30 15 units BID, Novolog 0-20 units TID with meals; Solumedrol 40 mg Q12H  Inpatient Diabetes Program Recommendations:    Insulin-If steroids are continued, please consider increasing 70/30 to 20 units BID. Please note, once steroids are tapered, will likely need to decrease dose of 70/30 as well.  Thanks, Barnie Alderman, RN, MSN, CDE Diabetes Coordinator Inpatient Diabetes Program [phone] (Team Pager from 8am to 5pm)

## 2020-11-01 NOTE — Progress Notes (Signed)
116 - Pt called out for RN.  Pt states she can't breathe, increased WOB, use of accessory muscles.  Neb given - no improvement.    Provider notified.  Bi-pap ordered. EKG ordered.  CXR ordered, labs ordered.  Admin xanax.    Pt complaining of chest pain 10/10 pressure.     11/20/2020 0135  Assess: MEWS Score  Pulse Rate (!) 104  Resp (!) 30  Level of Consciousness Alert  SpO2 98 %  O2 Device Nasal Cannula  O2 Flow Rate (L/min) 4 L/min  Assess: MEWS Score  MEWS Temp 0  MEWS Systolic 0  MEWS Pulse 1  MEWS RR 2  MEWS LOC 0  MEWS Score 3  MEWS Score Color Yellow  Assess: if the MEWS score is Yellow or Red  Were vital signs taken at a resting state? Yes  Focused Assessment Change from prior assessment (see assessment flowsheet)  Early Detection of Sepsis Score *See Row Information* Medium  MEWS guidelines implemented *See Row Information* Yes  Treat  MEWS Interventions Consulted Respiratory Therapy;Administered prn meds/treatments;Escalated (See documentation below)  Take Vital Signs  Increase Vital Sign Frequency  Yellow: Q 2hr X 2 then Q 4hr X 2, if remains yellow, continue Q 4hrs  Escalate  MEWS: Escalate Yellow: discuss with charge nurse/RN and consider discussing with provider and RRT  Notify: Charge Nurse/RN  Name of Charge Nurse/RN Notified Melissa, RN  Date Charge Nurse/RN Notified 11/20/2020  Time Charge Nurse/RN Notified 0220  Notify: Provider  Provider Name/Title Rachael Fee  Date Provider Notified 11/09/2020  Time Provider Notified 0139  Notification Type Page (secure text)  Notification Reason Change in status  Provider response See new orders  Date of Provider Response 11/03/2020  Time of Provider Response 0140             Note Details  Author Prentiss Bells, RN File Time 10/23/2020 2:45 AM  Author Type Registered Nurse Status Incomplete  Last Editor Prentiss Bells, [place] # [id number] Admit Date 10/23/2020         11/12/2020 0222  Assess: MEWS Score  BP (!) 143/64  ECG Heart Rate (!) 101  Resp (!) 25  Level of Consciousness Alert  SpO2 98 %  O2 Device Bi-PAP  Assess: MEWS Score  MEWS Temp 0  MEWS Systolic 0  MEWS Pulse 1  MEWS RR 1  MEWS LOC 0  MEWS Score 2  MEWS Score Color Yellow  Assess: if the MEWS score is Yellow or Red  Were vital signs taken at a resting state? Yes  Focused Assessment No change from prior assessment  Early Detection of Sepsis Score *See Row Information* Medium  MEWS guidelines implemented *See Row Information* Yes  Treat  MEWS Interventions Administered prn meds/treatments;Administered scheduled meds/treatments  Pain Scale 0-10  Pain Score 10  Pain Type Acute pain  Pain Location Chest  Pain Orientation Anterior;Posterior  Pain Radiating Towards back  Pain Descriptors / Indicators Pressure  Pain Frequency Constant  Pain Onset Sudden  Patients Stated Pain Goal 0  Pain Intervention(s) MD notified (Comment)  Take Vital Signs  Increase Vital Sign Frequency  Yellow: Q 2hr X 2 then Q 4hr X 2, if remains yellow, continue Q 4hrs  Escalate  MEWS: Escalate Yellow: discuss with charge nurse/RN and consider discussing with provider and RRT  Notify: Charge Nurse/RN  Name of Charge Nurse/RN Notified Melissa, RN  Date Charge Nurse/RN Notified 10/26/2020  Time Charge Nurse/RN Notified 0220  Notify: Provider  Provider Name/Title Rachael Fee  Date Provider Notified 10/27/2020  Time Provider Notified 0139  Notification Type Page  Notification Reason Other (Comment) (continuing management)  Provider response See new orders  Date of Provider Response 11/20/2020             Note Details  Author Prentiss Bells, RN File Time 10/27/2020 2:46 AM  Author Type Registered Nurse Status Incomplete  Last Editor Prentiss Bells, [place] # [id number] Admit Date Nov 09, 2020

## 2020-11-01 NOTE — Progress Notes (Addendum)
PROGRESS NOTE    Teresa Krueger  ENI:[id number] DOB: [birth year] DOA: Oct 29, 2020 PCP: Adin Hector, MD  Brief Narrative:   87/F morbidly obese, chronically ill with multiple medical problems namely CAD, chronic diastolic CHF, COPD, chronic lymphedema, CKD stage III AAA, type 2 diabetes mellitus, hypertension, dyslipidemia was undergoing work-up as outpatient this past week due to for presumed angina, chest pressure with shortness of breathy.  She was scheduled to have a stress test last week however due to worsening shortness of breath presented to the emergency room, dyspnea is progressive over the last 1 week, also has intermittent chest pain and pressure, in addition is also noted worsening lower extremity edema and productive cough. -In the ED she was noted to be hypoxic with evidence of fluid overload, subsequently started on BiPAP and IV Lasix -Cardiology was consulted, subsequently had mild bump in creatinine on account of which her Lasix was held temporarily -Cardiac cath which was initially considered was deferred pending improvement in kidney function -Overnight 5/11 with respiratory distress again requiring BiPAP and IV Lasix  Assessment & Plan:   Acute hypoxic respiratory failure Acute on chronic diastolic CHF COPD Suspected OSA/OHS -She is net even and weight is trending up -Clinically volume overloaded, required BiPAP overnight, given IV Lasix early this morning, will repeat another dose this afternoon -Cardiology following, ischemic work-up deferred at this time -2D echo done, results pending -Clinically do not suspect infectious process, CT and x-ray findings consistent with fluid overload as noted clinically as well, procalcitonin is negative, no fever or leukocytosis.  Will discontinue Solu-Medrol and azithromycin  AKI on CKD stage IIIa -Baseline creatinine around 1.1, creatinine this admission has been in the 1.4-1.6 range -Could be cardiorenal as well as worsened  by contrast for CTA on admission -Cozaar on hold -Monitor with diuresis  Chest pain/Elevated troponin History of CAD -Could be secondary to demand ischemia -Appreciate cardiology input, ischemic eval deferred in the setting of ongoing respiratory issues and renal insufficiency -Continue Plavix and statin  Insulin-dependent diabetes mellitus -Continue insulin 70/30 will decrease dose likely due to discontinuation of steroids  Hypertension Hyperlipidemia -Cozaar on hold  GERD PPI  Possible mesenteric ischemia Noted on CT angio abdomen from March Vascular surgery input appreciated, angiogram deferred in setting of renal insufficiency -Patient is tolerating p.o. intake with minimal symptoms -Vascular follow-up as outpatient  Chronic lymphedema -Worsened in the setting of edema and CHF  DVT prophylaxis: SQ Lovenox Code Status: DNR Family Communication: No family at bedside, updated son Disposition Plan: Status is: Inpatient  Remains inpatient appropriate because:Inpatient level of care appropriate due to severity of illness   Dispo: The patient is from: Home              Anticipated d/c is to: Home              Patient currently is not medically stable to d/c.   Difficult to place patient No  Consultants:   Cardiology- [facility]  Vascular surgery   Procedures: None  Antimicrobials:   None    Subjective: -Overnight had shortness of breath again, was given Lasix and started on BiPAP  Objective: Vitals:   11/14/2020 0530 10/28/2020 0637 11/13/2020 0754 11/07/2020 1143  BP:  (!) 118/55 128/75 (!) 135/44  Pulse:  76 78 87  Resp:  16 16 20   Temp:  (!) 97.4 F (36.3 C) 98 F (36.7 C) (!) 97.5 F (36.4 C)  TempSrc:  Axillary Axillary Oral  SpO2:  100% 100% 99%  Weight: 125.1 kg     Height:        Intake/Output Summary (Last 24 hours) at 10/31/2020 1221 Last data filed at 11/03/2020 0600 Gross per 24 hour  Intake 720 ml  Output 500 ml  Net 220 ml    Filed Weights   11/07/2020 0500 10/31/20 0500 11/02/2020 0530  Weight: 123.2 kg 122.5 kg 125.1 kg    Examination:  General exam: Obese chronically ill female laying in bed, BiPAP on  HEENT: Neck obese unable to assess JVD CVS: S1-S2, regular rate rhythm Lungs: Bilateral basilar Rales noted Abdomen: Soft, obese, nontender, bowel sounds present Extremities: Lymphedema noted in lower extremities Psych: Irritable, appropriate insight  Data Reviewed: I have personally reviewed following labs and imaging studies  CBC: Recent Labs  Lab 11/08/2020 1725 10/28/20 0015 10/28/20 1343 11/15/2020 0342 10/31/20 0507 10/22/2020 0223  WBC 4.8 4.8 5.9 5.0 5.2 5.8  NEUTROABS 2.9  --  3.7 2.9 2.9 5.5  HGB 9.4* 9.3* 9.5* 9.7* 9.4* 10.6*  HCT 29.9* 30.0* 30.1* 32.1* 30.7* 34.7*  MCV 88.7 89.6 89.1 93.3 89.8 90.6  PLT 161 149* 155 130* 138* 0000000   Basic Metabolic Panel: Recent Labs  Lab 11/03/2020 1725 10/28/20 0015 10/29/20 0442 10/24/2020 0342 10/31/20 0507 11/05/2020 0223  NA 136  --  137 138 139 136  K 5.2*  --  4.2 5.0 4.8 5.1  CL 100  --  100 103 102 100  CO2 27  --  26 25 27 24   GLUCOSE 210*  --  105* 109* 96 269*  BUN 63*  --  65* 70* 73* 73*  CREATININE 1.40* 1.39* 1.47* 1.64* 1.59* 1.57*  CALCIUM 9.1  --  8.9 8.9 9.0 9.2   GFR: Estimated Creatinine Clearance: 31.4 mL/min (A) (by C-G formula based on SCr of 1.57 mg/dL (H)). Liver Function Tests: No results for input(s): AST, ALT, ALKPHOS, BILITOT, PROT, ALBUMIN in the last 168 hours. No results for input(s): LIPASE, AMYLASE in the last 168 hours. No results for input(s): AMMONIA in the last 168 hours. Coagulation Profile: No results for input(s): INR, PROTIME in the last 168 hours. Cardiac Enzymes: No results for input(s): CKTOTAL, CKMB, CKMBINDEX, TROPONINI in the last 168 hours. BNP (last 3 results) No results for input(s): PROBNP in the last 8760 hours. HbA1C: No results for input(s): HGBA1C in the last 72 hours. CBG: Recent  Labs  Lab 10/31/20 1320 10/31/20 1636 10/31/20 2024 11/11/2020 0739 11/11/2020 1136  GLUCAP 286* 320* 269* 251* 329*   Lipid Profile: No results for input(s): CHOL, HDL, LDLCALC, TRIG, CHOLHDL, LDLDIRECT in the last 72 hours. Thyroid Function Tests: No results for input(s): TSH, T4TOTAL, FREET4, T3FREE, THYROIDAB in the last 72 hours. Anemia Panel: No results for input(s): VITAMINB12, FOLATE, FERRITIN, TIBC, IRON, RETICCTPCT in the last 72 hours. Sepsis Labs: Recent Labs  Lab 10/28/20 0532 10/24/2020 0342  PROCALCITON <0.10 <0.10    Recent Results (from the past 240 hour(s))  SARS CORONAVIRUS 2 (TAT 6-24 HRS) Nasopharyngeal Nasopharyngeal Swab     Status: None   Collection Time: 10/31/2020  5:27 PM   Specimen: Nasopharyngeal Swab  Result Value Ref Range Status   SARS Coronavirus 2 NEGATIVE NEGATIVE Final    Comment: (NOTE) SARS-CoV-2 target nucleic acids are NOT DETECTED.  The SARS-CoV-2 RNA is generally detectable in upper and lower respiratory specimens during the acute phase of infection. Negative results do not preclude SARS-CoV-2 infection, do not rule out co-infections with other pathogens,  and should not be used as the sole basis for treatment or other patient management decisions. Negative results must be combined with clinical observations, patient history, and epidemiological information. The expected result is Negative.  Fact Sheet for Patients: SugarRoll.be  Fact Sheet for Healthcare Providers: https://www.woods-mathews.com/  This test is not yet approved or cleared by the [place] FDA and  has been authorized for detection and/or diagnosis of SARS-CoV-2 by FDA under an Emergency Use Authorization (EUA). This EUA will remain  in effect (meaning this test can be used) for the duration of the COVID-19 declaration under Se ction 564(b)(1) of the Act, 21 U.S.C. section 360bbb-3(b)(1), unless the authorization is  terminated or revoked sooner.  Performed at [facility], [place] [address] [place], [place] [address]   MRSA PCR Screening     Status: None   Collection Time: 10/22/2020 11:21 PM   Specimen: Nasopharyngeal  Result Value Ref Range Status   MRSA by PCR NEGATIVE NEGATIVE Final    Comment:        The GeneXpert MRSA Assay (FDA approved for NASAL specimens only), is one component of a comprehensive MRSA colonization surveillance program. It is not intended to diagnose MRSA infection nor to guide or monitor treatment for MRSA infections. Performed at [facility], [place], [place] [address]   Expectorated Sputum Assessment w Gram Stain, Rflx to Resp Cult     Status: None   Collection Time: 11/14/2020  2:00 AM   Specimen: Sputum  Result Value Ref Range Status   Specimen Description SPUTUM  Final   Special Requests NONE  Final   Sputum evaluation   Final    Sputum specimen not acceptable for testing.  Please recollect.   Coral Ceo RN [phone] 11/04/2020 HNM Performed at [facility], [address], [place], [place] [address]    Report Status 10/29/2020 FINAL  Final         Radiology Studies: [facility] Chest Port 1 View  Result Date: 11/09/2020 CLINICAL DATA:  87 year old female with wheezing. EXAM: PORTABLE CHEST 1 VIEW COMPARISON:  Chest radiograph dated 11/07/2020. FINDINGS: There is cardiomegaly with vascular congestion and edema. Pneumonia is not excluded clinical correlation recommended. Small bilateral pleural effusion. No pneumothorax. Atherosclerotic calcification of the aorta. Right pectoral pacemaker device. No acute osseous pathology. IMPRESSION: Cardiomegaly with findings of CHF. Pneumonia is not excluded. Electronically Signed   By: Anner Crete M.D.   On: 11/08/2020 02:26        Scheduled Meds: . acetaminophen  650 mg Oral BH-q7a  . ALPRAZolam  0.5 mg Oral BID  . azithromycin  500 mg Oral Daily  . budesonide (PULMICORT)  nebulizer solution  0.5 mg Nebulization BID  . capsaicin   Topical BID  . chlorhexidine  15 mL Mouth Rinse BID  . Chlorhexidine Gluconate Cloth  6 each Topical Q0600  . clopidogrel  75 mg Oral Daily  . enoxaparin (LOVENOX) injection  0.5 mg/kg Subcutaneous Q24H  . furosemide  40 mg Intravenous Daily  . insulin aspart  0-20 Units Subcutaneous TID WC  . insulin aspart protamine- aspart  15 Units Subcutaneous BID WC  . ipratropium-albuterol  3 mL Nebulization Q6H  . lidocaine  1 patch Transdermal Q24H  . mouth rinse  15 mL Mouth Rinse q12n4p  . methylPREDNISolone (SOLU-MEDROL) injection  40 mg Intravenous Q12H  . multivitamin with minerals  1 tablet Oral QODAY  . mupirocin ointment   Topical BID  . pantoprazole  40 mg Oral Daily  . simvastatin  20 mg Oral q1600  . sodium chloride flush  3 mL Intravenous Q12H  . triamcinolone cream   Topical BID   Continuous Infusions: . sodium chloride       LOS: 5 days    Time spent: 25 minutes  Domenic Polite, MD [facility]  10/24/2020, 12:21 PM

## 2020-11-01 NOTE — Progress Notes (Signed)
Patient Name: Teresa Krueger Date of Encounter: 11/03/2020  Hospital Problem List     Principal Problem:   Acute on chronic respiratory failure with hypoxia [facility]) Active Problems:   Diabetes mellitus (HCC)   GERD (gastroesophageal reflux disease)   Morbid obesity ([facility])   Benign essential hypertension   Lymphedema   CKD (chronic kidney disease) stage 3, GFR 30-59 ml/min (HCC)   Angina at rest [facility])   CHF (congestive heart failure) [facility])    Patient Profile      Teresa Krueger a 87 y.o.femalewith history oflower extremity lymphedema, history of heart block status post permanent pacemaker, COPD, diabetes, hyperlipidemia, hypertension who presented to the emergency room with increasing shortness of breath. She was scheduled for a outpatient Myoview for next week however the patient canceled it thinking that her lungs would preclude her being able to do this. She has chest pain with both typical atypical features. She states that it radiates from her chest to her back. This appears to be present both at rest and with exertion but she does state that on occasion it worsens with exertion. She denies syncope or presyncope. She is compliant with her medications. She states she had cardiac catheterizations at least twice in the past 1 at [facility] of 1 at [facility]. Cath at [facility] was done in 1988 showing normal coronary arteries. Most recent echo done in December 2021 showed preserved LV function with mild aortic stenosis with a valve area of 1.0 cm by continuity equation. She is resting comfortably at present. Her troponin has peaked at 247. There is another 1 pending. BNP is 952. 6 months ago was 384. EKG showed sinus rhythm with ventricular paced rhythm. Chest x-ray revealed slight left midlung atelectasis. No evidence of congestive heart failure. Pacemaker present. Chest CT revealed no pulmonary embolus. There was mild cardiomegaly with possible mild  congestive heart failure.  Subjective   Complains of sob and chest pain with increased BNP to 1119 from 952 4 days ago. Troponins not significantly increased and have been flat in the 200-300 range. CXR showed chf.   Inpatient Medications    . acetaminophen  650 mg Oral BH-q7a  . ALPRAZolam  0.5 mg Oral BID  . azithromycin  500 mg Oral Daily  . budesonide (PULMICORT) nebulizer solution  0.5 mg Nebulization BID  . capsaicin   Topical BID  . chlorhexidine  15 mL Mouth Rinse BID  . Chlorhexidine Gluconate Cloth  6 each Topical Q0600  . clopidogrel  75 mg Oral Daily  . enoxaparin (LOVENOX) injection  0.5 mg/kg Subcutaneous Q24H  . insulin aspart  0-20 Units Subcutaneous TID WC  . insulin aspart protamine- aspart  15 Units Subcutaneous BID WC  . ipratropium-albuterol  3 mL Nebulization Q6H  . lidocaine  1 patch Transdermal Q24H  . mouth rinse  15 mL Mouth Rinse q12n4p  . methylPREDNISolone (SOLU-MEDROL) injection  40 mg Intravenous Q12H  . multivitamin with minerals  1 tablet Oral QODAY  . mupirocin ointment   Topical BID  . pantoprazole  40 mg Oral Daily  . simvastatin  20 mg Oral q1600  . sodium chloride flush  3 mL Intravenous Q12H  . triamcinolone cream   Topical BID    Vital Signs    Vitals:   11/08/2020 0350 11/13/2020 0530 11/03/2020 0637 11/02/2020 0754  BP: (!) 157/58  (!) 118/55 128/75  Pulse: 86  76 78  Resp: (!) 22  16 16   Temp: 97.7  F (36.5 C)  (!) 97.4 F (36.3 C) 98 F (36.7 C)  TempSrc:   Axillary Axillary  SpO2: 100%  100% 100%  Weight:  125.1 kg    Height:        Intake/Output Summary (Last 24 hours) at 11/20/2020 0800 Last data filed at 10/28/2020 0600 Gross per 24 hour  Intake 997.06 ml  Output 500 ml  Net 497.06 ml   Filed Weights   Nov 03, 2020 0500 10/31/20 0500 11/07/2020 0530  Weight: 123.2 kg 122.5 kg 125.1 kg    Physical Exam   .  HEENT: normal.  Neck: Supple, no JVD, carotid bruits, or masses. Cardiac: RRR,  Respiratory:  Decreased breath  sounds.bialteral wheezing GI: Soft, nontender, nondistended, BS + x 4. MS: no deformity or atrophy. Skin: warm and dry, no rash. Neuro:  Strength and sensation are intact. Psych: Normal affect.  Labs    CBC Recent Labs    10/31/20 0507 10/26/2020 0223  WBC 5.2 5.8  NEUTROABS 2.9 5.5  HGB 9.4* 10.6*  HCT 30.7* 34.7*  MCV 89.8 90.6  PLT 138* 938   Basic Metabolic Panel Recent Labs    10/31/20 0507 11/16/2020 0223  NA 139 136  K 4.8 5.1  CL 102 100  CO2 27 24  GLUCOSE 96 269*  BUN 73* 73*  CREATININE 1.59* 1.57*  CALCIUM 9.0 9.2   Liver Function Tests No results for input(s): AST, ALT, ALKPHOS, BILITOT, PROT, ALBUMIN in the last 72 hours. No results for input(s): LIPASE, AMYLASE in the last 72 hours. Cardiac Enzymes No results for input(s): CKTOTAL, CKMB, CKMBINDEX, TROPONINI in the last 72 hours. BNP Recent Labs    11/04/2020 0223  BNP 1,119.0*   D-Dimer No results for input(s): DDIMER in the last 72 hours. Hemoglobin A1C No results for input(s): HGBA1C in the last 72 hours. Fasting Lipid Panel No results for input(s): CHOL, HDL, LDLCALC, TRIG, CHOLHDL, LDLDIRECT in the last 72 hours. Thyroid Function Tests No results for input(s): TSH, T4TOTAL, T3FREE, THYROIDAB in the last 72 hours.  Invalid input(s): FREET3  Telemetry    V pacing   ECG    V paced  Radiology    DG Chest 2 View  Result Date: 10/25/2020 CLINICAL DATA:  Shortness of breath EXAM: CHEST - 2 VIEW COMPARISON:  December 07, 2018. FINDINGS: There is no edema or airspace opacity. There is slight atelectasis in the left mid lung. Heart is upper normal in size with pacemaker leads attached to the right atrium and right ventricle. No adenopathy. There is aortic atherosclerosis. There is degenerative change in the thoracic spine. IMPRESSION: Slight left midlung atelectasis. No edema or airspace opacity. Heart upper normal in size with pacemaker leads attached to right atrium and right ventricle. Aortic  Atherosclerosis (ICD10-I70.0). Electronically Signed   By: Lowella Grip III M.D.   On: 10/22/2020 17:08   CT ANGIO CHEST PE W OR WO CONTRAST  Result Date: 10/28/2020 CLINICAL DATA:  87 year old female with concern for pulmonary embolism. EXAM: CT ANGIOGRAPHY CHEST WITH CONTRAST TECHNIQUE: Multidetector CT imaging of the chest was performed using the standard protocol during bolus administration of intravenous contrast. Multiplanar CT image reconstructions and MIPs were obtained to evaluate the vascular anatomy. CONTRAST:  97mL OMNIPAQUE IOHEXOL 350 MG/ML SOLN COMPARISON:  Chest CT dated 02/07/2016. FINDINGS: Cardiovascular: Mild cardiomegaly. No pericardial effusion. There is coronary vascular calcification and calcification of the mitral annulus. Right pectoral pacemaker device. Advanced atherosclerotic calcification of the thoracic aorta. No aneurysmal dilatation. Evaluation  of the pulmonary arteries is limited due to respiratory motion artifact. No large or central pulmonary artery embolus identified. Mediastinum/Nodes: No hilar or mediastinal adenopathy. The esophagus is grossly unremarkable. No mediastinal fluid collection. Lungs/Pleura: There are small bilateral pleural effusions with bibasilar atelectasis. Diffuse interstitial and interlobular septal prominence and patchy bilateral ground-glass opacities consistent with vascular congestion and edema. Scattered ground-glass nodularity may represent edema or pneumonia. Clinical correlation is recommended. No pneumothorax. The central airways are patent. Upper Abdomen: There is a 15 mm hypodense lesion in the right lobe of the liver. Cholecystectomy. Indeterminate subcentimeter left renal hypodense lesion. Musculoskeletal: Osteopenia with scoliosis and degenerative changes of the spine. No acute osseous pathology. Review of the MIP images confirms the above findings. IMPRESSION: 1. No CT evidence of central pulmonary artery embolus. 2. Mild cardiomegaly  with findings of CHF and small bilateral pleural effusions. Scattered ground-glass nodularity may represent edema or pneumonia. Clinical correlation is recommended. 3. Aortic Atherosclerosis (ICD10-I70.0). Electronically Signed   By: Anner Crete M.D.   On: 10/28/2020 01:14   DG Chest Port 1 View  Result Date: 10/31/2020 CLINICAL DATA:  87 year old female with wheezing. EXAM: PORTABLE CHEST 1 VIEW COMPARISON:  Chest radiograph dated 11/02/2020. FINDINGS: There is cardiomegaly with vascular congestion and edema. Pneumonia is not excluded clinical correlation recommended. Small bilateral pleural effusion. No pneumothorax. Atherosclerotic calcification of the aorta. Right pectoral pacemaker device. No acute osseous pathology. IMPRESSION: Cardiomegaly with findings of CHF. Pneumonia is not excluded. Electronically Signed   By: Anner Crete M.D.   On: 11/03/2020 02:26    Assessment & Plan    87 y.o.femalewith history oflower extremity lymphedema, history of heart block status post permanent pacemaker, COPD, diabetes, hyperlipidemia, hypertension who presented to the emergency room with increasing shortness of breath. She was scheduled for a outpatient Myoview for next week however the patient canceled it thinking that her lungs would preclude her being able to do this. She has chest pain with both typical atypical features. She states that it radiates from her chest to her back. This appears to be present both at rest and with exertion but she does state that on occasion it worsens with exertion. She denies syncope or presyncope. She is compliant with her medications. She states she had cardiac catheterizations at least twice in the past 1 at [facility] of 1 at [facility]. Cath at [facility] was done in 1988 showing normal coronary arteries. Most recent echo done in December 2021 showed preserved LV function with mild aortic stenosis with a valve area of 1.0 cm by continuity  equation. She is resting comfortably at present. Her troponin has peaked at 247. There is another 1 pending. BNP is 952. 6 months ago was 384. EKG showed sinus rhythm with ventricular paced rhythm. Chest x-ray revealed slight left midlung atelectasis. No evidence of congestive heart failure. Pacemaker present. Chest CT revealed no pulmonary embolus. There was mild cardiomegaly with possible mild congestive heart failure.  1. Chest pain-etiology unclear. Mildly elevated serum troponins thus far. This could be secondary to demand due to respiratory failure as well as mild congestive heart failure. Alternatively, given her symptoms ischemia may be present. We will review subsequent troponin. Patient was scheduled for a functional study which has been canceledby the patient as she does not feel she would be able to go through it in her "respiratory state".Discussed noninvasive versus invasive treatment with the patient yesterday. She feels like she may need further evaluation of the coronaries. Her troponin  has peaked at the 300 range but has remained flat. This does not appear to have been an acute coronary event however likely is demand ischemia in the face of respiratory distress. Certainly the patient may have progression of her coronary disease. Cardiac catheterizations in the distant past have not shown any significant disease. As mentioned above, she is currently scheduled for cardiac catheterization in the morning however her renal function has continued to worsen somewhat. Her creatinine October 19, 2020 as an outpatient was 1.1. Is 1.57 now .  No further contrast studies are warranted at present given relative ischemic stability. She received contrast on admission for her CTA and repeat contrast burden may cause worsening renal insufficiency especially given her probable volume depleted state. Will continue medical management and consideration for further ischemic workup will be  undertaken as her renal function and pulmonary function improves.   2. Lymphedema-chronic.   3. Heart block-has a permanent pacemaker which is ventricular pacing. Functioning normally.  4. COPD-continue with bronchodilators,  5. Diabetes-continue with current therapy  6. Hyperlipidemia-continue with statins.  7. Renal insufficiency-creatinine was 1.1 on October 11, 2020. Received contrast on admission.Creatinine improved somewhat to 1.59 from 1.64 yesterday. . Baseline is 1.1. Will continue current medical therapy. Will defer any further contrast studies at present.   8. Abdominal discomfort-patient states she had some postprandial abdominal pain at home. She states it is better here.   Signed, Javier Docker Arleigh Dicola MD 2020-12-01, 8:00 AM  Pager: [phone]

## 2020-11-01 NOTE — Progress Notes (Signed)
[employer] Patient with increased work of breathing and wheezing accompanied by chest pain Chest xray FINDINGS: There is cardiomegaly with vascular congestion and edema. Pneumonia is not excluded clinical correlation recommended. Small bilateral pleural effusion. No pneumothorax. Atherosclerotic calcification of the aorta. Right pectoral pacemaker device. No acute osseous pathology. IMPRESSION: Cardiomegaly with findings of CHF. Pneumonia is not excluded.  BNP up tp 1119 up from  593 on admit.   Despite no significant improvement in renal function, creat currently 1.57 down from 1.59,  Diuresis initiated in setting of possible worsening resp failure from fluid overload

## 2020-11-02 ENCOUNTER — Encounter: Payer: Medicare Other | Admitting: Occupational Therapy

## 2020-11-02 DIAGNOSIS — J9621 Acute and chronic respiratory failure with hypoxia: Secondary | ICD-10-CM | POA: Diagnosis not present

## 2020-11-02 LAB — BASIC METABOLIC PANEL
Anion gap: 10 (ref 5–15)
Anion gap: 12 (ref 5–15)
BUN: 95 mg/dL — ABNORMAL HIGH (ref 8–23)
BUN: 97 mg/dL — ABNORMAL HIGH (ref 8–23)
CO2: 28 mmol/L (ref 22–32)
CO2: 27 mmol/L (ref 22–32)
Calcium: 9.2 mg/dL (ref 8.9–10.3)
Calcium: 9.1 mg/dL (ref 8.9–10.3)
Chloride: 100 mmol/L (ref 98–111)
Chloride: 98 mmol/L (ref 98–111)
Creatinine, Ser: 1.7 mg/dL — ABNORMAL HIGH (ref 0.44–1.00)
Creatinine, Ser: 1.73 mg/dL — ABNORMAL HIGH (ref 0.44–1.00)
GFR, Estimated: 29 mL/min — ABNORMAL LOW (ref >60)
GFR, Estimated: 29 mL/min — ABNORMAL LOW (ref >60)
Glucose, Bld: 159 mg/dL — ABNORMAL HIGH (ref 70–99)
Glucose, Bld: 165 mg/dL — ABNORMAL HIGH (ref 70–99)
Potassium: 5.4 mmol/L — ABNORMAL HIGH (ref 3.5–5.1)
Potassium: 4.7 mmol/L (ref 3.5–5.1)
Sodium: 138 mmol/L (ref 135–145)
Sodium: 137 mmol/L (ref 135–145)

## 2020-11-02 LAB — CBC WITH DIFFERENTIAL/PLATELET
Abs Immature Granulocytes: 0.03 10*3/uL (ref 0.00–0.07)
Basophils Absolute: 0 10*3/uL (ref 0.0–0.1)
Basophils Relative: 0 %
Eosinophils Absolute: 0 10*3/uL (ref 0.0–0.5)
Eosinophils Relative: 0 %
HCT: 30.8 % — ABNORMAL LOW (ref 36.0–46.0)
Hemoglobin: 9.8 g/dL — ABNORMAL LOW (ref 12.0–15.0)
Immature Granulocytes: 0 %
Lymphocytes Relative: 5 %
Lymphs Abs: 0.4 10*3/uL — ABNORMAL LOW (ref 0.7–4.0)
MCH: 28.3 pg (ref 26.0–34.0)
MCHC: 31.8 g/dL (ref 30.0–36.0)
MCV: 89 fL (ref 80.0–100.0)
Monocytes Absolute: 0.8 10*3/uL (ref 0.1–1.0)
Monocytes Relative: 10 %
Neutro Abs: 6.5 10*3/uL (ref 1.7–7.7)
Neutrophils Relative %: 85 %
Platelets: 179 10*3/uL (ref 150–400)
RBC: 3.46 MIL/uL — ABNORMAL LOW (ref 3.87–5.11)
RDW: 13.3 % (ref 11.5–15.5)
WBC: 7.7 10*3/uL (ref 4.0–10.5)
nRBC: 0 % (ref 0.0–0.2)

## 2020-11-02 LAB — GLUCOSE, CAPILLARY
Glucose-Capillary: 136 mg/dL — ABNORMAL HIGH (ref 70–99)
Glucose-Capillary: 106 mg/dL — ABNORMAL HIGH (ref 70–99)
Glucose-Capillary: 171 mg/dL — ABNORMAL HIGH (ref 70–99)
Glucose-Capillary: 133 mg/dL — ABNORMAL HIGH (ref 70–99)

## 2020-11-02 LAB — HEPATIC FUNCTION PANEL
ALT: 18 U/L (ref 0–44)
AST: 29 U/L (ref 15–41)
Albumin: 3.6 g/dL (ref 3.5–5.0)
Alkaline Phosphatase: 89 U/L (ref 38–126)
Bilirubin, Direct: <0.1 mg/dL (ref 0.0–0.2)
Total Bilirubin: 0.6 mg/dL (ref 0.3–1.2)
Total Protein: 6.8 g/dL (ref 6.5–8.1)

## 2020-11-02 MED ORDER — INSULIN ASPART PROT & ASPART (70-30 MIX) 100 UNIT/ML ~~LOC~~ SUSP
10.0000 [IU] | Freq: Two times a day (BID) | SUBCUTANEOUS | Status: DC
  Administered 2020-11-02 – 2020-11-03 (×2): 10 [IU] via SUBCUTANEOUS
  Filled 2020-11-02 (×2): qty 10

## 2020-11-02 MED ORDER — ENOXAPARIN SODIUM 40 MG/0.4ML IJ SOSY
40.0000 mg | PREFILLED_SYRINGE | INTRAMUSCULAR | Status: DC
  Administered 2020-11-03 – 2020-11-05 (×3): 40 mg via SUBCUTANEOUS
  Filled 2020-11-02 (×3): qty 0.4

## 2020-11-02 MED ORDER — INSULIN ASPART 100 UNIT/ML IJ SOLN
0.0000 [IU] | Freq: Three times a day (TID) | INTRAMUSCULAR | Status: DC
  Administered 2020-11-02 – 2020-11-03 (×2): 2 [IU] via SUBCUTANEOUS
  Administered 2020-11-03: 1 [IU] via SUBCUTANEOUS
  Administered 2020-11-04 (×2): 3 [IU] via SUBCUTANEOUS
  Administered 2020-11-04: 2 [IU] via SUBCUTANEOUS
  Administered 2020-11-05: 5 [IU] via SUBCUTANEOUS
  Administered 2020-11-05: 9 [IU] via SUBCUTANEOUS
  Administered 2020-11-05: 5 [IU] via SUBCUTANEOUS
  Administered 2020-11-06: 3 [IU] via SUBCUTANEOUS
  Administered 2020-11-06: 5 [IU] via SUBCUTANEOUS
  Administered 2020-11-06: 1 [IU] via SUBCUTANEOUS
  Administered 2020-11-07: 3 [IU] via SUBCUTANEOUS
  Administered 2020-11-07: 5 [IU] via SUBCUTANEOUS
  Administered 2020-11-07: 2 [IU] via SUBCUTANEOUS
  Administered 2020-11-08: 1 [IU] via SUBCUTANEOUS
  Administered 2020-11-08: 2 [IU] via SUBCUTANEOUS
  Administered 2020-11-08: 5 [IU] via SUBCUTANEOUS
  Administered 2020-11-09: 3 [IU] via SUBCUTANEOUS
  Administered 2020-11-09 – 2020-11-10 (×3): 2 [IU] via SUBCUTANEOUS
  Administered 2020-11-10: 1 [IU] via SUBCUTANEOUS
  Filled 2020-11-02 (×23): qty 1

## 2020-11-02 MED ORDER — FUROSEMIDE 10 MG/ML IJ SOLN
40.0000 mg | Freq: Two times a day (BID) | INTRAMUSCULAR | Status: DC
  Administered 2020-11-02: 40 mg via INTRAVENOUS
  Filled 2020-11-02: qty 4

## 2020-11-02 MED ORDER — ACETAMINOPHEN 325 MG PO TABS: 650.0000 mg | ORAL_TABLET | Freq: Every evening | ORAL | Status: DC

## 2020-11-02 MED ORDER — ACETAMINOPHEN 325 MG PO TABS
650.0000 mg | ORAL_TABLET | Freq: Every day | ORAL | Status: DC
  Administered 2020-11-02 – 2020-11-05 (×4): 650 mg via ORAL
  Filled 2020-11-02 (×4): qty 2

## 2020-11-02 NOTE — Progress Notes (Signed)
PROGRESS NOTE    Teresa Krueger  GEX:[id number] DOB: [birth year] DOA: 2020-11-05 PCP: Adin Hector, MD  Brief Narrative:   87/F morbidly obese, chronically ill with multiple medical problems namely CAD, chronic diastolic CHF, COPD, chronic lymphedema, CKD stage III AAA, type 2 diabetes mellitus, hypertension, dyslipidemia was undergoing work-up as outpatient this past week due to for presumed angina, chest pressure with shortness of breathy.  She was scheduled to have a stress test last week however due to worsening shortness of breath presented to the emergency room, dyspnea is progressive over the last 1 week, also has intermittent chest pain and pressure, in addition is also noted worsening lower extremity edema and productive cough. -In the ED she was noted to be hypoxic with evidence of fluid overload, subsequently started on BiPAP and IV Lasix -Cardiology was consulted, subsequently had mild bump in creatinine on account of which her Lasix was held temporarily -Cardiac cath which was initially considered was deferred pending improvement in kidney function -Overnight 5/11 with respiratory distress again requiring BiPAP and IV Lasix  Assessment & Plan:   Acute hypoxic respiratory failure Acute on chronic diastolic CHF COPD Suspected OSA/OHS -She is net even and weight is trending up -Clinically volume overloaded, requiring BiPAP intermittently,  -Continue IV Lasix 40 mg every 12 -Cardiology following, ischemic work-up deferred at this time -2D echo noted EF of 55%, moderate MR and moderate AR -Clinically do not suspect infectious process, CT and x-ray findings consistent with fluid overload as noted clinically as well, procalcitonin is negative, no fever or leukocytosis.  Discontinued Solu-Medrol and azithromycin -Might be appropriate for trilogy NIPPV at discharge, discussed with patient about this, since she lives alone she is doubtful about her ability to manage this  AKI on  CKD stage IIIa -Baseline creatinine around 1.1, creatinine this admission has been in the 1.4-1.6 range -Could be cardiorenal as well as worsened by contrast for CTA on admission -Cozaar on hold -Creatinine is 1.7 today, monitor  Chest pain/Elevated troponin History of CAD -Could be secondary to demand ischemia -Appreciate cardiology input, ischemic eval deferred in the setting of ongoing respiratory issues and renal insufficiency -Continue Plavix and statin  Insulin-dependent diabetes mellitus -Continue insulin 70/30, will decrease dose  Hypertension Hyperlipidemia -Cozaar on hold  GERD PPI  Possible mesenteric ischemia Noted on CT angio abdomen from March Vascular surgery input appreciated, angiogram deferred in setting of renal insufficiency -Patient is tolerating p.o. intake with minimal symptoms -Vascular follow-up as outpatient  Chronic lymphedema -Worsened in the setting of edema and CHF  DVT prophylaxis: SQ Lovenox Code Status: DNR Family Communication: No family at bedside, updated son 5/11 Disposition Plan: Status is: Inpatient  Remains inpatient appropriate because:Inpatient level of care appropriate due to severity of illness   Dispo: The patient is from: Home              Anticipated d/c is to: Home versus short-term rehab              Patient currently is not medically stable to d/c.   Difficult to place patient No  Consultants:   Cardiology- [facility]  Vascular surgery   Procedures: None  Antimicrobials:   None    Subjective: -Breathing improving, has not urinated much though  Objective: Vitals:   11/19/2020 2036 11/02/20 0500 11/02/20 0537 11/02/20 0800  BP: (!) 143/51  (!) 124/47 (!) 134/53  Pulse: 77  69 80  Resp: 20  20 18   Temp: 97.8 F (  36.6 C)  97.8 F (36.6 C) 98.1 F (36.7 C)  TempSrc:   Oral Oral  SpO2: 100%  99% 96%  Weight:  124.7 kg    Height:        Intake/Output Summary (Last 24 hours) at 11/02/2020  1436 Last data filed at 11/02/2020 0940 Gross per 24 hour  Intake 840 ml  Output 1550 ml  Net -710 ml   Filed Weights   10/31/20 0500 Nov 21, 2020 0530 11/02/20 0500  Weight: 122.5 kg 125.1 kg 124.7 kg    Examination:  General exam: Obese chronically ill female sitting up in bed, AAOx3, uncomfortable appearing HEENT: Neck obese unable to assess JVD CVS: S1-S2, regular rate rhythm Lungs: Bilateral basilar rales noted Abdomen: Obese, soft, nontender, bowel sounds present Extremities: Lymphedema, in both lower extremities Psych: Irritable, appropriate insight  Data Reviewed: I have personally reviewed following labs and imaging studies  CBC: Recent Labs  Lab 10/28/20 1343 11/11/2020 0342 10/31/20 0507 11/16/2020 0223 11/02/20 0427  WBC 5.9 5.0 5.2 5.8 7.7  NEUTROABS 3.7 2.9 2.9 5.5 6.5  HGB 9.5* 9.7* 9.4* 10.6* 9.8*  HCT 30.1* 32.1* 30.7* 34.7* 30.8*  MCV 89.1 93.3 89.8 90.6 89.0  PLT 155 130* 138* 179 875   Basic Metabolic Panel: Recent Labs  Lab 10/29/20 0442 10/29/2020 0342 10/31/20 0507 21-Nov-2020 0223 11/02/20 0427  NA 137 138 139 136 138  K 4.2 5.0 4.8 5.1 5.4*  CL 100 103 102 100 100  CO2 26 25 27 24 28   GLUCOSE 105* 109* 96 269* 159*  BUN 65* 70* 73* 73* 95*  CREATININE 1.47* 1.64* 1.59* 1.57* 1.70*  CALCIUM 8.9 8.9 9.0 9.2 9.2   GFR: Estimated Creatinine Clearance: 29 mL/min (A) (by C-G formula based on SCr of 1.7 mg/dL (H)). Liver Function Tests: Recent Labs  Lab 11/02/20 0427  AST 29  ALT 18  ALKPHOS 89  BILITOT 0.6  PROT 6.8  ALBUMIN 3.6   No results for input(s): LIPASE, AMYLASE in the last 168 hours. No results for input(s): AMMONIA in the last 168 hours. Coagulation Profile: No results for input(s): INR, PROTIME in the last 168 hours. Cardiac Enzymes: No results for input(s): CKTOTAL, CKMB, CKMBINDEX, TROPONINI in the last 168 hours. BNP (last 3 results) No results for input(s): PROBNP in the last 8760 hours. HbA1C: No results for input(s):  HGBA1C in the last 72 hours. CBG: Recent Labs  Lab 11/08/2020 1136 Nov 21, 2020 1650 Nov 21, 2020 2006 11/02/20 0811 11/02/20 1133  GLUCAP 329* 258* 214* 136* 106*   Lipid Profile: No results for input(s): CHOL, HDL, LDLCALC, TRIG, CHOLHDL, LDLDIRECT in the last 72 hours. Thyroid Function Tests: No results for input(s): TSH, T4TOTAL, FREET4, T3FREE, THYROIDAB in the last 72 hours. Anemia Panel: No results for input(s): VITAMINB12, FOLATE, FERRITIN, TIBC, IRON, RETICCTPCT in the last 72 hours. Sepsis Labs: Recent Labs  Lab 10/28/20 0532 10/25/2020 0342  PROCALCITON <0.10 <0.10    Recent Results (from the past 240 hour(s))  SARS CORONAVIRUS 2 (TAT 6-24 HRS) Nasopharyngeal Nasopharyngeal Swab     Status: None   Collection Time: 11/09/2020  5:27 PM   Specimen: Nasopharyngeal Swab  Result Value Ref Range Status   SARS Coronavirus 2 NEGATIVE NEGATIVE Final    Comment: (NOTE) SARS-CoV-2 target nucleic acids are NOT DETECTED.  The SARS-CoV-2 RNA is generally detectable in upper and lower respiratory specimens during the acute phase of infection. Negative results do not preclude SARS-CoV-2 infection, do not rule out co-infections with other pathogens, and should  not be used as the sole basis for treatment or other patient management decisions. Negative results must be combined with clinical observations, patient history, and epidemiological information. The expected result is Negative.  Fact Sheet for Patients: SugarRoll.be  Fact Sheet for Healthcare Providers: https://www.woods-mathews.com/  This test is not yet approved or cleared by the [place] FDA and  has been authorized for detection and/or diagnosis of SARS-CoV-2 by FDA under an Emergency Use Authorization (EUA). This EUA will remain  in effect (meaning this test can be used) for the duration of the COVID-19 declaration under Se ction 564(b)(1) of the Act, 21 U.S.C. section  360bbb-3(b)(1), unless the authorization is terminated or revoked sooner.  Performed at [facility], [place] [address], [place], [place] [address]   MRSA PCR Screening     Status: None   Collection Time: 11/05/2020 11:21 PM   Specimen: Nasopharyngeal  Result Value Ref Range Status   MRSA by PCR NEGATIVE NEGATIVE Final    Comment:        The GeneXpert MRSA Assay (FDA approved for NASAL specimens only), is one component of a comprehensive MRSA colonization surveillance program. It is not intended to diagnose MRSA infection nor to guide or monitor treatment for MRSA infections. Performed at [facility], [place], [place] [address]   Expectorated Sputum Assessment w Gram Stain, Rflx to Resp Cult     Status: None   Collection Time: 10/31/2020  2:00 AM   Specimen: Sputum  Result Value Ref Range Status   Specimen Description SPUTUM  Final   Special Requests NONE  Final   Sputum evaluation   Final    Sputum specimen not acceptable for testing.  Please recollect.   Coral Ceo RN [phone] 11/14/2020 HNM Performed at [facility], [address] [place], [place] [address]    Report Status 10/28/2020 FINAL  Final         Radiology Studies: [facility] Chest Port 1 View  Result Date: 11/02/2020 CLINICAL DATA:  87 year old female with wheezing. EXAM: PORTABLE CHEST 1 VIEW COMPARISON:  Chest radiograph dated 10/31/2020. FINDINGS: There is cardiomegaly with vascular congestion and edema. Pneumonia is not excluded clinical correlation recommended. Small bilateral pleural effusion. No pneumothorax. Atherosclerotic calcification of the aorta. Right pectoral pacemaker device. No acute osseous pathology. IMPRESSION: Cardiomegaly with findings of CHF. Pneumonia is not excluded. Electronically Signed   By: Anner Crete M.D.   On: 11/14/2020 02:26   ECHOCARDIOGRAM COMPLETE  Result Date: 11/19/2020    ECHOCARDIOGRAM REPORT   Patient Name:   Teresa Krueger [facility] Date of  Exam: 11/15/2020 Medical Rec #:  [id number]        Height:       59.0 in Accession #:    [id number]       Weight:       275.8 lb Date of Birth:  15-Mar-1934        BSA:          2.114 m Patient Age:    26 years         BP:           128/75 mmHg Patient Gender: F                HR:           78 bpm. Exam Location:  [facility] Procedure: 2D Echo, Color Doppler and Cardiac Doppler Indications:     CHF- acute diastolic [id number]  History:  Patient has prior history of Echocardiogram examinations, most                  recent 03/17/2015. Pacemaker, COPD; Risk Factors:Hypertension                  and Diabetes.  Sonographer:     Cristela Blue RDCS (AE) Referring Phys:  [id number] Dalia Heading Diagnosing Phys: Marcina Millard MD IMPRESSIONS  1. Left ventricular ejection fraction, by estimation, is 55 to 60%. The left ventricle has normal function. The left ventricle has no regional wall motion abnormalities. Left ventricular diastolic parameters were normal.  2. Right ventricular systolic function is normal. The right ventricular size is normal.  3. The mitral valve is normal in structure. Mild to moderate mitral valve regurgitation. No evidence of mitral stenosis.  4. The aortic valve is normal in structure. Aortic valve regurgitation is moderate. No aortic stenosis is present.  5. The inferior vena cava is normal in size with greater than 50% respiratory variability, suggesting right atrial pressure of 3 mmHg. FINDINGS  Left Ventricle: Left ventricular ejection fraction, by estimation, is 55 to 60%. The left ventricle has normal function. The left ventricle has no regional wall motion abnormalities. The left ventricular internal cavity size was normal in size. There is  no left ventricular hypertrophy. Left ventricular diastolic parameters were normal. Right Ventricle: The right ventricular size is normal. No increase in right ventricular wall thickness. Right ventricular systolic function is normal. Left Atrium: Left atrial  size was normal in size. Right Atrium: Right atrial size was normal in size. Pericardium: There is no evidence of pericardial effusion. Mitral Valve: The mitral valve is normal in structure. Mild to moderate mitral valve regurgitation. No evidence of mitral valve stenosis. Tricuspid Valve: The tricuspid valve is normal in structure. Tricuspid valve regurgitation is mild . No evidence of tricuspid stenosis. Aortic Valve: The aortic valve is normal in structure. Aortic valve regurgitation is moderate. No aortic stenosis is present. Aortic valve mean gradient measures 15.7 mmHg. Aortic valve peak gradient measures 27.2 mmHg. Aortic valve area, by VTI measures  0.86 cm. Pulmonic Valve: The pulmonic valve was normal in structure. Pulmonic valve regurgitation is not visualized. No evidence of pulmonic stenosis. Aorta: The aortic root is normal in size and structure. Venous: The inferior vena cava is normal in size with greater than 50% respiratory variability, suggesting right atrial pressure of 3 mmHg. IAS/Shunts: No atrial level shunt detected by color flow Doppler.  LEFT VENTRICLE PLAX 2D LVIDd:         5.12 cm  Diastology LVIDs:         3.55 cm  LV e' medial:    3.59 cm/s LV PW:         1.40 cm  LV E/e' medial:  41.5 LV IVS:        0.99 cm  LV e' lateral:   5.87 cm/s LVOT diam:     2.00 cm  LV E/e' lateral: 25.4 LV SV:         52 LV SV Index:   25 LVOT Area:     3.14 cm  RIGHT VENTRICLE RV Basal diam:  4.07 cm RV S prime:     14.10 cm/s TAPSE (M-mode): 4.6 cm LEFT ATRIUM             Index       RIGHT ATRIUM           Index LA diam:  4.60 cm 2.18 cm/m  RA Area:     21.00 cm LA Vol (A2C):   89.2 ml 42.19 ml/m RA Volume:   60.40 ml  28.57 ml/m LA Vol (A4C):   78.7 ml 37.22 ml/m LA Biplane Vol: 83.9 ml 39.68 ml/m  AORTIC VALVE                    PULMONIC VALVE AV Area (Vmax):    0.94 cm     PV Vmax:        0.89 m/s AV Area (Vmean):   0.96 cm     PV Peak grad:   3.1 mmHg AV Area (VTI):     0.86 cm     RVOT  Peak grad: 3 mmHg AV Vmax:           261.00 cm/s AV Vmean:          184.333 cm/s AV VTI:            0.606 m AV Peak Grad:      27.2 mmHg AV Mean Grad:      15.7 mmHg LVOT Vmax:         77.70 cm/s LVOT Vmean:        56.100 cm/s LVOT VTI:          0.166 m LVOT/AV VTI ratio: 0.27  AORTA Ao Root diam: 2.70 cm MITRAL VALVE                TRICUSPID VALVE MV Area (PHT): 5.58 cm     TR Peak grad:   40.7 mmHg MV Decel Time: 136 msec     TR Vmax:        319.00 cm/s MV E velocity: 149.00 cm/s MV A velocity: 127.00 cm/s  SHUNTS MV E/A ratio:  1.17         Systemic VTI:  0.17 m                             Systemic Diam: 2.00 cm Isaias Cowman MD Electronically signed by Isaias Cowman MD Signature Date/Time: 10/23/2020/1:07:34 PM    Final         Scheduled Meds: . acetaminophen  650 mg Oral QPM  . ALPRAZolam  0.5 mg Oral BID  . budesonide (PULMICORT) nebulizer solution  0.5 mg Nebulization BID  . capsaicin   Topical BID  . chlorhexidine  15 mL Mouth Rinse BID  . Chlorhexidine Gluconate Cloth  6 each Topical Q0600  . clopidogrel  75 mg Oral Daily  . [START ON 11/03/2020] enoxaparin (LOVENOX) injection  40 mg Subcutaneous Q24H  . furosemide  40 mg Intravenous BID  . insulin aspart  0-20 Units Subcutaneous TID WC  . insulin aspart protamine- aspart  12 Units Subcutaneous BID WC  . ipratropium-albuterol  3 mL Nebulization Q6H  . lidocaine  1 patch Transdermal Q24H  . mouth rinse  15 mL Mouth Rinse q12n4p  . multivitamin with minerals  1 tablet Oral QODAY  . mupirocin ointment   Topical BID  . pantoprazole  40 mg Oral Daily  . simvastatin  20 mg Oral q1600  . sodium chloride flush  3 mL Intravenous Q12H  . triamcinolone cream   Topical BID   Continuous Infusions: . sodium chloride       LOS: 6 days    Time spent: 25 minutes  Domenic Polite, MD [facility]  11/02/2020, 2:36 PM

## 2020-11-02 NOTE — Care Management Important Message (Signed)
Important Message  Patient Details  Name: Teresa Krueger MRN: [id number] Date of Birth: [birth year]   Medicare Important Message Given:  Yes     Dannette Barbara 11/02/2020, 1:09 PM

## 2020-11-02 NOTE — Progress Notes (Signed)
Occupational Therapy Treatment Patient Details Name: ADRIE PICKING MRN: [id number] DOB: [birth year] Today's Date: 11/02/2020    History of present illness Tajae Maiolo is an 87 y/o F admitted on 10/23/2020 with c/c of SOB over the last week, pain on L side of neck & back, & BLE edema. Pt being treated for acute decompensated diastolic CHF & hypoxic respiratory failure. PMH: CAD, chronic lymphadema, COPD, CKD stage 3, HLD, HTN, DM, morbid obesity. Cardiac cath on 5/10 canceled due to concerns of kidney function.   OT comments  Pt seen for OT treatment this date to f/u re: safety with ADLs/ADL mobility. Pt reporting she would like to get washed up. Pt requires MIN A for bed level UB bathing and drying to ensure thorough completion given skin and body habitus. OT engages pt in rolling technique and pt able to roll with CGA/MIN A with use of railing. Pt requires MOD/MAX A for LB bathing/drying and OT assists in applying barrier cream for prevention of skin breakdown, completing ed with pt re: through drying under skin. Pt with good reception. Pt requires SETUP to don clean gown and is left with all needs met and in reach. Pt on BiPap throughout session with only ~2-3 mins on nasal cannula while completing g/h tasks including washing face and oral care with SETUP. Pt reports wanting to get OOB this date, but endorses being too fatigued after bathing tasks and her grand son presents to visit pt. Will continue to follow acutely. Will continue to recommend HHOT at this time, but will continue to assess for potential rehab needs as pt has been less able to get up OOB and participate with therapy than was originally hoped d/t decreased O2 status during hospitalization.    Follow Up Recommendations  Home health OT;Supervision - Intermittent    Equipment Recommendations  None recommended by OT    Recommendations for Other Services      Precautions / Restrictions Precautions Precautions:  Fall Restrictions Weight Bearing Restrictions: No       Mobility Bed Mobility Overal bed mobility: Needs Assistance Bed Mobility: Rolling Rolling: Min guard;Min assist   Supine to sit: Mod assist;HOB elevated     General bed mobility comments: increased time, cues for use of railing and sequence/technique to assist with rolling for ADLs.    Transfers Overall transfer level: Needs assistance Equipment used: Rolling walker (2 wheeled) Transfers: Sit to/from [facility] Sit to Stand: Min guard;Supervision Stand pivot transfers: Supervision;Min guard       General transfer comment: deferred    Balance Overall balance assessment: Needs assistance Sitting-balance support: Feet supported;No upper extremity supported Sitting balance-Leahy Scale: Good                                     ADL either performed or assessed with clinical judgement   ADL Overall ADL's : Needs assistance/impaired         Upper Body Bathing: Moderate assistance;Bed level Upper Body Bathing Details (indicate cue type and reason): using lateral rolling technique. Bed level as pt is on bipap and does not yet feel ready to get OOB Lower Body Bathing: Maximal assistance;Bed level Lower Body Bathing Details (indicate cue type and reason): lateral rolling method. Pt able to contribute, MAX A primarily for thorough completion Upper Body Dressing : Minimal assistance;Bed level Upper Body Dressing Details (indicate cue type and reason): to doff dirty gown and  don clean one                         Vision Baseline Vision/History: Wears glasses Wears Glasses: At all times Patient Visual Report: No change from baseline     Perception     Praxis      Cognition Arousal/Alertness: Awake/alert Behavior During Therapy: WFL for tasks assessed/performed Overall Cognitive Status: Within Functional Limits for tasks assessed                                  General Comments:  (less anxious this pm)        Exercises Other Exercises Other Exercises: OT engages pt in bathing and dressing tasks, bed level for energy conservation.   Shoulder Instructions       General Comments Pt remained on 6L O2 throughout session 98% at rest 91% briefly after getting to chair    Pertinent Vitals/ Pain       Pain Assessment: No/denies pain  Home Living                                          Prior Functioning/Environment              Frequency  Min 1X/week        Progress Toward Goals  OT Goals(current goals can now be found in the care plan section)  Progress towards OT goals: Not progressing toward goals - comment;OT to reassess next treatment (pt limited by oxygenation status today, will re-assess next tx.)  Acute Rehab OT Goals Patient Stated Goal: to go home and be able to age in place OT Goal Formulation: With patient Time For Goal Achievement: 11/12/20 Potential to Achieve Goals: Good  Plan Discharge plan remains appropriate    Co-evaluation                 AM-PAC OT "6 Clicks" Daily Activity     Outcome Measure   Help from another person eating meals?: None Help from another person taking care of personal grooming?: A Little Help from another person toileting, which includes using toliet, bedpan, or urinal?: A Little Help from another person bathing (including washing, rinsing, drying)?: A Little Help from another person to put on and taking off regular upper body clothing?: A Little Help from another person to put on and taking off regular lower body clothing?: A Lot 6 Click Score: 18    End of Session Equipment Utilized During Treatment: Oxygen  OT Visit Diagnosis: Unsteadiness on feet (R26.81);Muscle weakness (generalized) (M62.81);Pain   Activity Tolerance Patient tolerated treatment well   Patient Left with call bell/phone within reach;in bed;with bed alarm set   Nurse  Communication Other (comment)        Time: 2707-8675 OT Time Calculation (min): 38 min  Charges: OT General Charges $OT Visit: 1 Visit OT Treatments $Self Care/Home Management : 23-37 mins $Therapeutic Activity: 8-22 mins  Gerrianne Scale, MS, OTR/L ascom [phone] 11/02/20, 5:14 PM

## 2020-11-02 NOTE — Progress Notes (Signed)
Patient Name: Teresa Krueger Date of Encounter: 11/02/2020  Hospital Problem List     Principal Problem:   Acute on chronic respiratory failure with hypoxia [facility]) Active Problems:   Diabetes mellitus (HCC)   GERD (gastroesophageal reflux disease)   Morbid obesity ([place])   Benign essential hypertension   Lymphedema   CKD (chronic kidney disease) stage 3, GFR 30-59 ml/min (HCC)   Angina at rest [facility])   CHF (congestive heart failure) [facility])    Patient Profile     Teresa Krueger a 87 y.o.femalewith history oflower extremity lymphedema, history of heart block status post permanent pacemaker, COPD, diabetes, hyperlipidemia, hypertension who presented to the emergency room with increasing shortness of breath. She was scheduled for a outpatient Myoview for next week however the patient canceled it thinking that her lungs would preclude her being able to do this. She has chest pain with both typical atypical features. She states that it radiates from her chest to her back. This appears to be present both at rest and with exertion but she does state that on occasion it worsens with exertion. She denies syncope or presyncope. She is compliant with her medications. She states she had cardiac catheterizations at least twice in the past 1 at [facility] of 1 at [facility]. Cath at [facility] was done in 1988 showing normal coronary arteries. Most recent echo done in December 2021 showed preserved LV function with mild aortic stenosis with a valve area of 1.0 cm by continuity equation. She is resting comfortably at present. Her troponin has peaked at 247. There is another 1 pending. BNP is 952. 6 months ago was 384. EKG showed sinus rhythm with ventricular paced rhythm. Chest x-ray revealed slight left midlung atelectasis. No evidence of congestive heart failure. Pacemaker present. Chest CT revealed no pulmonary embolus. There was mild cardiomegaly with possible mild  congestive heart failure.  Subjective   Still sob but improved somewhat on oxygen and after diuresis. . Has diuresed 1.178 liters since admission.   Inpatient Medications    . acetaminophen  650 mg Oral BH-q7a  . ALPRAZolam  0.5 mg Oral BID  . budesonide (PULMICORT) nebulizer solution  0.5 mg Nebulization BID  . capsaicin   Topical BID  . chlorhexidine  15 mL Mouth Rinse BID  . Chlorhexidine Gluconate Cloth  6 each Topical Q0600  . clopidogrel  75 mg Oral Daily  . enoxaparin (LOVENOX) injection  0.5 mg/kg Subcutaneous Q24H  . furosemide  40 mg Intravenous Daily  . insulin aspart  0-20 Units Subcutaneous TID WC  . insulin aspart protamine- aspart  12 Units Subcutaneous BID WC  . ipratropium-albuterol  3 mL Nebulization Q6H  . lidocaine  1 patch Transdermal Q24H  . mouth rinse  15 mL Mouth Rinse q12n4p  . multivitamin with minerals  1 tablet Oral QODAY  . mupirocin ointment   Topical BID  . pantoprazole  40 mg Oral Daily  . simvastatin  20 mg Oral q1600  . sodium chloride flush  3 mL Intravenous Q12H  . triamcinolone cream   Topical BID    Vital Signs    Vitals:   11/11/2020 1143 11/14/2020 1520 10/22/2020 2036 11/02/20 0537  BP: (!) 135/44 (!) 140/49 (!) 143/51 (!) 124/47  Pulse: 87 81 77 69  Resp: 20 19 20 20   Temp: (!) 97.5 F (36.4 C) 97.9 F (36.6 C) 97.8 F (36.6 C) 97.8 F (36.6 C)  TempSrc: Oral Axillary  Oral  SpO2:  99% 99% 100% 99%  Weight:      Height:        Intake/Output Summary (Last 24 hours) at 11/02/2020 0732 Last data filed at 11/02/2020 0300 Gross per 24 hour  Intake 360 ml  Output 975 ml  Net -615 ml   Filed Weights   11/17/2020 0500 10/31/20 0500 Nov 25, 2020 0530  Weight: 123.2 kg 122.5 kg 125.1 kg    Physical Exam    GEN: Well nourished, somewhat anxious  HEENT: normal.  Neck: Supple, no JVD, carotid bruits, or masses. Cardiac: RRR, no murmurs, rubs, or gallops. No clubbing, cyanosis, edema.  Radials/DP/PT 2+ and equal bilaterally.   Respiratory:  Bilateral rhonchi. GI: Soft, nontender, nondistended, BS + x 4. MS: no deformity or atrophy. Skin: warm and dry, no rash. Neuro:  Strength and sensation are intact. Psych: Normal affect.  Labs    CBC Recent Labs    2020/11/25 0223 11/02/20 0427  WBC 5.8 7.7  NEUTROABS 5.5 6.5  HGB 10.6* 9.8*  HCT 34.7* 30.8*  MCV 90.6 89.0  PLT 179 734   Basic Metabolic Panel Recent Labs    11-25-2020 0223 11/02/20 0427  NA 136 138  K 5.1 5.4*  CL 100 100  CO2 24 28  GLUCOSE 269* 159*  BUN 73* 95*  CREATININE 1.57* 1.70*  CALCIUM 9.2 9.2   Liver Function Tests No results for input(s): AST, ALT, ALKPHOS, BILITOT, PROT, ALBUMIN in the last 72 hours. No results for input(s): LIPASE, AMYLASE in the last 72 hours. Cardiac Enzymes No results for input(s): CKTOTAL, CKMB, CKMBINDEX, TROPONINI in the last 72 hours. BNP Recent Labs    November 25, 2020 0223  BNP 1,119.0*   D-Dimer No results for input(s): DDIMER in the last 72 hours. Hemoglobin A1C No results for input(s): HGBA1C in the last 72 hours. Fasting Lipid Panel No results for input(s): CHOL, HDL, LDLCALC, TRIG, CHOLHDL, LDLDIRECT in the last 72 hours. Thyroid Function Tests No results for input(s): TSH, T4TOTAL, T3FREE, THYROIDAB in the last 72 hours.  Invalid input(s): FREET3  Telemetry    V paced  ECG    V paced  Radiology    DG Chest 2 View  Result Date: 10/24/2020 CLINICAL DATA:  Shortness of breath EXAM: CHEST - 2 VIEW COMPARISON:  December 07, 2018. FINDINGS: There is no edema or airspace opacity. There is slight atelectasis in the left mid lung. Heart is upper normal in size with pacemaker leads attached to the right atrium and right ventricle. No adenopathy. There is aortic atherosclerosis. There is degenerative change in the thoracic spine. IMPRESSION: Slight left midlung atelectasis. No edema or airspace opacity. Heart upper normal in size with pacemaker leads attached to right atrium and right ventricle.  Aortic Atherosclerosis (ICD10-I70.0). Electronically Signed   By: Lowella Grip III M.D.   On: 10/28/2020 17:08   CT ANGIO CHEST PE W OR WO CONTRAST  Result Date: 10/28/2020 CLINICAL DATA:  87 year old female with concern for pulmonary embolism. EXAM: CT ANGIOGRAPHY CHEST WITH CONTRAST TECHNIQUE: Multidetector CT imaging of the chest was performed using the standard protocol during bolus administration of intravenous contrast. Multiplanar CT image reconstructions and MIPs were obtained to evaluate the vascular anatomy. CONTRAST:  75mL OMNIPAQUE IOHEXOL 350 MG/ML SOLN COMPARISON:  Chest CT dated 02/07/2016. FINDINGS: Cardiovascular: Mild cardiomegaly. No pericardial effusion. There is coronary vascular calcification and calcification of the mitral annulus. Right pectoral pacemaker device. Advanced atherosclerotic calcification of the thoracic aorta. No aneurysmal dilatation. Evaluation of the pulmonary arteries is  limited due to respiratory motion artifact. No large or central pulmonary artery embolus identified. Mediastinum/Nodes: No hilar or mediastinal adenopathy. The esophagus is grossly unremarkable. No mediastinal fluid collection. Lungs/Pleura: There are small bilateral pleural effusions with bibasilar atelectasis. Diffuse interstitial and interlobular septal prominence and patchy bilateral ground-glass opacities consistent with vascular congestion and edema. Scattered ground-glass nodularity may represent edema or pneumonia. Clinical correlation is recommended. No pneumothorax. The central airways are patent. Upper Abdomen: There is a 15 mm hypodense lesion in the right lobe of the liver. Cholecystectomy. Indeterminate subcentimeter left renal hypodense lesion. Musculoskeletal: Osteopenia with scoliosis and degenerative changes of the spine. No acute osseous pathology. Review of the MIP images confirms the above findings. IMPRESSION: 1. No CT evidence of central pulmonary artery embolus. 2. Mild  cardiomegaly with findings of CHF and small bilateral pleural effusions. Scattered ground-glass nodularity may represent edema or pneumonia. Clinical correlation is recommended. 3. Aortic Atherosclerosis (ICD10-I70.0). Electronically Signed   By: Anner Crete M.D.   On: 10/28/2020 01:14   DG Chest Port 1 View  Result Date: 11/02/2020 CLINICAL DATA:  87 year old female with wheezing. EXAM: PORTABLE CHEST 1 VIEW COMPARISON:  Chest radiograph dated 10/23/2020. FINDINGS: There is cardiomegaly with vascular congestion and edema. Pneumonia is not excluded clinical correlation recommended. Small bilateral pleural effusion. No pneumothorax. Atherosclerotic calcification of the aorta. Right pectoral pacemaker device. No acute osseous pathology. IMPRESSION: Cardiomegaly with findings of CHF. Pneumonia is not excluded. Electronically Signed   By: Anner Crete M.D.   On: 11/02/2020 02:26   ECHOCARDIOGRAM COMPLETE  Result Date: 10/31/2020    ECHOCARDIOGRAM REPORT   Patient Name:   Teresa Krueger [facility] Date of Exam: 11/13/2020 Medical Rec #:  [id number]        Height:       59.0 in Accession #:    [id number]       Weight:       275.8 lb Date of Birth:  10/02/1933        BSA:          2.114 m Patient Age:    54 years         BP:           128/75 mmHg Patient Gender: F                HR:           78 bpm. Exam Location:  [facility] Procedure: 2D Echo, Color Doppler and Cardiac Doppler Indications:     CHF- acute diastolic K53.97  History:         Patient has prior history of Echocardiogram examinations, most                  recent 03/17/2015. Pacemaker, COPD; Risk Factors:Hypertension                  and Diabetes.  Sonographer:     Sherrie Sport RDCS (AE) Referring Phys:  [place] Diagnosing Phys: Isaias Cowman MD IMPRESSIONS  1. Left ventricular ejection fraction, by estimation, is 55 to 60%. The left ventricle has normal function. The left ventricle has no regional wall motion abnormalities. Left ventricular  diastolic parameters were normal.  2. Right ventricular systolic function is normal. The right ventricular size is normal.  3. The mitral valve is normal in structure. Mild to moderate mitral valve regurgitation. No evidence of mitral stenosis.  4. The aortic valve is normal in structure. Aortic valve regurgitation is moderate. No  aortic stenosis is present.  5. The inferior vena cava is normal in size with greater than 50% respiratory variability, suggesting right atrial pressure of 3 mmHg. FINDINGS  Left Ventricle: Left ventricular ejection fraction, by estimation, is 55 to 60%. The left ventricle has normal function. The left ventricle has no regional wall motion abnormalities. The left ventricular internal cavity size was normal in size. There is  no left ventricular hypertrophy. Left ventricular diastolic parameters were normal. Right Ventricle: The right ventricular size is normal. No increase in right ventricular wall thickness. Right ventricular systolic function is normal. Left Atrium: Left atrial size was normal in size. Right Atrium: Right atrial size was normal in size. Pericardium: There is no evidence of pericardial effusion. Mitral Valve: The mitral valve is normal in structure. Mild to moderate mitral valve regurgitation. No evidence of mitral valve stenosis. Tricuspid Valve: The tricuspid valve is normal in structure. Tricuspid valve regurgitation is mild . No evidence of tricuspid stenosis. Aortic Valve: The aortic valve is normal in structure. Aortic valve regurgitation is moderate. No aortic stenosis is present. Aortic valve mean gradient measures 15.7 mmHg. Aortic valve peak gradient measures 27.2 mmHg. Aortic valve area, by VTI measures  0.86 cm. Pulmonic Valve: The pulmonic valve was normal in structure. Pulmonic valve regurgitation is not visualized. No evidence of pulmonic stenosis. Aorta: The aortic root is normal in size and structure. Venous: The inferior vena cava is normal in size with  greater than 50% respiratory variability, suggesting right atrial pressure of 3 mmHg. IAS/Shunts: No atrial level shunt detected by color flow Doppler.  LEFT VENTRICLE PLAX 2D LVIDd:         5.12 cm  Diastology LVIDs:         3.55 cm  LV e' medial:    3.59 cm/s LV PW:         1.40 cm  LV E/e' medial:  41.5 LV IVS:        0.99 cm  LV e' lateral:   5.87 cm/s LVOT diam:     2.00 cm  LV E/e' lateral: 25.4 LV SV:         52 LV SV Index:   25 LVOT Area:     3.14 cm  RIGHT VENTRICLE RV Basal diam:  4.07 cm RV S prime:     14.10 cm/s TAPSE (M-mode): 4.6 cm LEFT ATRIUM             Index       RIGHT ATRIUM           Index LA diam:        4.60 cm 2.18 cm/m  RA Area:     21.00 cm LA Vol (A2C):   89.2 ml 42.19 ml/m RA Volume:   60.40 ml  28.57 ml/m LA Vol (A4C):   78.7 ml 37.22 ml/m LA Biplane Vol: 83.9 ml 39.68 ml/m  AORTIC VALVE                    PULMONIC VALVE AV Area (Vmax):    0.94 cm     PV Vmax:        0.89 m/s AV Area (Vmean):   0.96 cm     PV Peak grad:   3.1 mmHg AV Area (VTI):     0.86 cm     RVOT Peak grad: 3 mmHg AV Vmax:           261.00 cm/s AV Vmean:  184.333 cm/s AV VTI:            0.606 m AV Peak Grad:      27.2 mmHg AV Mean Grad:      15.7 mmHg LVOT Vmax:         77.70 cm/s LVOT Vmean:        56.100 cm/s LVOT VTI:          0.166 m LVOT/AV VTI ratio: 0.27  AORTA Ao Root diam: 2.70 cm MITRAL VALVE                TRICUSPID VALVE MV Area (PHT): 5.58 cm     TR Peak grad:   40.7 mmHg MV Decel Time: 136 msec     TR Vmax:        319.00 cm/s MV E velocity: 149.00 cm/s MV A velocity: 127.00 cm/s  SHUNTS MV E/A ratio:  1.17         Systemic VTI:  0.17 m                             Systemic Diam: 2.00 cm Isaias Cowman MD Electronically signed by Isaias Cowman MD Signature Date/Time: 11/16/2020/1:07:34 PM    Final     Assessment & Plan     87 y.o.femalewith history oflower extremity lymphedema, history of heart block status post permanent pacemaker, COPD, diabetes, hyperlipidemia,  hypertension who presented to the emergency room with increasing shortness of breath. She was scheduled for a outpatient Myoview for next week however the patient canceled it thinking that her lungs would preclude her being able to do this. She has chest pain with both typical atypical features. She states that it radiates from her chest to her back. This appears to be present both at rest and with exertion but she does state that on occasion it worsens with exertion. She denies syncope or presyncope. She is compliant with her medications. She states she had cardiac catheterizations at least twice in the past 1 at [facility] of 1 at [facility]. Cath at [facility] was done in 1988 showing normal coronary arteries. Most recent echo done in December 2021 showed preserved LV function with mild aortic stenosis with a valve area of 1.0 cm by continuity equation. She is resting comfortably at present. Her troponin has peaked at 247. There is another 1 pending. BNP is 952. 6 months ago was 384. EKG showed sinus rhythm with ventricular paced rhythm. Chest x-ray revealed slight left midlung atelectasis. No evidence of congestive heart failure. Pacemaker present. Chest CT revealed no pulmonary embolus. There was mild cardiomegaly with possible mild congestive heart failure.  1. Chest pain-etiology unclear. Mildly elevated serum troponins thus far. This could be secondary to demand due to respiratory failure as well as mild congestive heart failure. Alternatively, given her symptoms ischemia may be present. We will review subsequent troponin. Patient was scheduled for a functional study which has been canceledby the patient as she does not feel she would be able to go through it in her "respiratory state".Discussed noninvasive versus invasive treatment with the patient yesterday. She feels like she may need further evaluation of the coronaries. Her troponin has peaked at the 300  range but has remained flat. This does not appear to have been an acute coronary event however likely is demand ischemia in the face of respiratory distress. Certainly the patient may have progression of her coronary disease. Cardiac catheterizations in the  distant past have not shown any significant disease. As mentioned above, she is currently scheduled for cardiac catheterization in the morning however her renal function has continued to worsen somewhat. Her creatinine October 19, 2020 as an outpatient was 1.1. Is 1.57 now.No further contrast studies are warranted at present given relative ischemic stability. She received contrast on admission for her CTA and repeat contrast burden may cause worsening renal insufficiency especially given her probable volume depleted state.Will continue medical management and consideration for further ischemic workup will be undertakenas her renal function and pulmonary function improves.Does not appear to require invasive cardiac workup during this hospitalization based on current issues. Can be considered as outpatient when renal status improves/stabilizes. Toroponin has not increased significantly.   2. Lymphedema-chronic.   3. Heart block-has a permanent pacemaker which is ventricular pacing. Functioning normally.  4. COPD-continue with bronchodilators,  5. Diabetes-continue with current therapy  6. Hyperlipidemia-continue with statins.  7. Renal insufficiency-creatinine was 1.1 on October 11, 2020. Received contrast on admission.Creatinineback up to 1.7 after diuresis.  Baseline is 1.1.Will continue current medical therapy. Will defer any further contrast studies at present.  8. Abdominal discomfort-patient states she had some postprandial abdominal pain at home. She states it is better here.    Signed, Javier Docker Lulubelle Simcoe MD 11/02/2020, 7:32 AM  Pager: [phone]

## 2020-11-02 NOTE — Progress Notes (Signed)
Physical Therapy Treatment Patient Details Name: Teresa Krueger MRN: [id number] DOB: 07-Nov-1933 Today's Date: 11/02/2020    History of Present Illness [name] is an 87 y/o F admitted on 11/08/2020 with c/c of SOB over the last week, pain on L side of neck & back, & BLE edema. Pt being treated for acute decompensated diastolic CHF & hypoxic respiratory failure. PMH: CAD, chronic lymphadema, COPD, CKD stage 3, HLD, HTN, DM, morbid obesity. Cardiac cath on 5/10 canceled due to concerns of kidney function.    PT Comments    Pt feeling much better this afternoon. Agreed to [place].  No c/o pain, 98% on 6L O2 at rest.  Pt required ModA to scoot to EOB with HOB raised. Good sitting balance, sit to stand from slightly raised bed to RW with supervision.  Pt able to advance a few steps toward bedside recliner with supervision, vc's for increased safety awareness.  Pt positioned to comfort in recliner with B LE's elevated. O2 sats initially 91% upon reaching chair with quick return to baseline.  Great tolerance for mobility today.  Pt with less anxiety this session.  Continue PT per POC.   Follow Up Recommendations  Home health PT;Supervision for mobility/OOB     Equipment Recommendations  None recommended by PT    Recommendations for Other Services       Precautions / Restrictions Precautions Precautions: Fall Restrictions Weight Bearing Restrictions: No    Mobility  Bed Mobility Overal bed mobility: Needs Assistance Bed Mobility: Supine to Sit     Supine to sit: Mod assist;HOB elevated          Transfers Overall transfer level: Needs assistance Equipment used: Rolling walker (2 wheeled) Transfers: Sit to/from [facility] Sit to Stand: Min guard;Supervision Stand pivot transfers: Supervision;Min guard       General transfer comment: standby for safety and management of lines  Ambulation/Gait Ambulation/Gait assistance: Min guard Gait Distance (Feet): 2  Feet Assistive device: Rolling walker (2 wheeled) Gait Pattern/deviations: Decreased step length - right;Decreased step length - left;Wide base of support;Trunk flexed         Stairs             Wheelchair Mobility    Modified Rankin (Stroke Patients Only)       Balance Overall balance assessment: Needs assistance Sitting-balance support: Feet supported;No upper extremity supported Sitting balance-Leahy Scale: Good                                      Cognition Arousal/Alertness: Awake/alert Behavior During Therapy: WFL for tasks assessed/performed Overall Cognitive Status: Within Functional Limits for tasks assessed                                 General Comments:  (less anxious this pm)      Exercises      General Comments General comments (skin integrity, edema, etc.): Pt remained on 6L O2 throughout session 98% at rest 91% briefly after getting to chair      Pertinent Vitals/Pain Pain Assessment: No/denies pain    Home Living                      Prior Function            PT Goals (current goals can now be found  in the care plan section) Acute Rehab PT Goals Patient Stated Goal: to go home and be able to age in place Progress towards PT goals: Progressing toward goals    Frequency    Min 2X/week      PT Plan Current plan remains appropriate    Co-evaluation              AM-PAC PT "6 Clicks" Mobility   Outcome Measure  Help needed turning from your back to your side while in a flat bed without using bedrails?: A Lot Help needed moving from lying on your back to sitting on the side of a flat bed without using bedrails?: A Lot Help needed moving to and from a bed to a chair (including a wheelchair)?: A Little Help needed standing up from a chair using your arms (e.g., wheelchair or bedside chair)?: A Little Help needed to walk in hospital room?: A Lot Help needed climbing 3-5 steps with a  railing? : Total 6 Click Score: 13    End of Session Equipment Utilized During Treatment: Oxygen Activity Tolerance: Patient tolerated treatment well Patient left: in chair;with call bell/phone within reach Nurse Communication: Mobility status PT Visit Diagnosis: Difficulty in walking, not elsewhere classified (R26.2);Muscle weakness (generalized) (M62.81)     Time: 1516-1600 PT Time Calculation (min) (ACUTE ONLY): 44 min  Charges:  $Therapeutic Activity: 38-52 mins                     Mikel Cella, PTA  Josie Dixon 11/02/2020, 4:08 PM

## 2020-11-03 ENCOUNTER — Inpatient Hospital Stay: Payer: Medicare Other

## 2020-11-03 DIAGNOSIS — J9621 Acute and chronic respiratory failure with hypoxia: Secondary | ICD-10-CM | POA: Diagnosis not present

## 2020-11-03 LAB — CBC WITH DIFFERENTIAL/PLATELET
Abs Immature Granulocytes: 0.01 10*3/uL (ref 0.00–0.07)
Basophils Absolute: 0 10*3/uL (ref 0.0–0.1)
Basophils Relative: 0 %
Eosinophils Absolute: 0.4 10*3/uL (ref 0.0–0.5)
Eosinophils Relative: 7 %
HCT: 28.3 % — ABNORMAL LOW (ref 36.0–46.0)
Hemoglobin: 8.7 g/dL — ABNORMAL LOW (ref 12.0–15.0)
Immature Granulocytes: 0 %
Lymphocytes Relative: 14 %
Lymphs Abs: 0.7 10*3/uL (ref 0.7–4.0)
MCH: 27.6 pg (ref 26.0–34.0)
MCHC: 30.7 g/dL (ref 30.0–36.0)
MCV: 89.8 fL (ref 80.0–100.0)
Monocytes Absolute: 0.7 10*3/uL (ref 0.1–1.0)
Monocytes Relative: 14 %
Neutro Abs: 3.5 10*3/uL (ref 1.7–7.7)
Neutrophils Relative %: 65 %
Platelets: 151 10*3/uL (ref 150–400)
RBC: 3.15 MIL/uL — ABNORMAL LOW (ref 3.87–5.11)
RDW: 13.4 % (ref 11.5–15.5)
WBC: 5.4 10*3/uL (ref 4.0–10.5)
nRBC: 0 % (ref 0.0–0.2)

## 2020-11-03 LAB — BASIC METABOLIC PANEL
Anion gap: 10 (ref 5–15)
BUN: 98 mg/dL — ABNORMAL HIGH (ref 8–23)
CO2: 27 mmol/L (ref 22–32)
Calcium: 8.9 mg/dL (ref 8.9–10.3)
Chloride: 102 mmol/L (ref 98–111)
Creatinine, Ser: 1.78 mg/dL — ABNORMAL HIGH (ref 0.44–1.00)
GFR, Estimated: 28 mL/min — ABNORMAL LOW (ref >60)
Glucose, Bld: 101 mg/dL — ABNORMAL HIGH (ref 70–99)
Potassium: 4.5 mmol/L (ref 3.5–5.1)
Sodium: 139 mmol/L (ref 135–145)

## 2020-11-03 LAB — GLUCOSE, CAPILLARY
Glucose-Capillary: 121 mg/dL — ABNORMAL HIGH (ref 70–99)
Glucose-Capillary: 104 mg/dL — ABNORMAL HIGH (ref 70–99)
Glucose-Capillary: 192 mg/dL — ABNORMAL HIGH (ref 70–99)
Glucose-Capillary: 198 mg/dL — ABNORMAL HIGH (ref 70–99)

## 2020-11-03 MED ORDER — FUROSEMIDE 10 MG/ML IJ SOLN
80.0000 mg | Freq: Two times a day (BID) | INTRAMUSCULAR | Status: AC
  Administered 2020-11-03 (×2): 80 mg via INTRAVENOUS
  Filled 2020-11-03 (×2): qty 8

## 2020-11-03 MED ORDER — INSULIN ASPART PROT & ASPART (70-30 MIX) 100 UNIT/ML ~~LOC~~ SUSP
8.0000 [IU] | Freq: Two times a day (BID) | SUBCUTANEOUS | Status: DC
  Administered 2020-11-03: 8 [IU] via SUBCUTANEOUS
  Filled 2020-11-03: qty 10

## 2020-11-03 MED ORDER — IPRATROPIUM-ALBUTEROL 0.5-2.5 (3) MG/3ML IN SOLN
3.0000 mL | Freq: Three times a day (TID) | RESPIRATORY_TRACT | Status: DC
  Administered 2020-11-03 – 2020-11-04 (×2): 3 mL via RESPIRATORY_TRACT
  Filled 2020-11-03 (×4): qty 3

## 2020-11-03 MED ORDER — SALINE SPRAY 0.65 % NA SOLN
1.0000 | NASAL | Status: DC | PRN
  Filled 2020-11-03: qty 44

## 2020-11-03 NOTE — Progress Notes (Signed)
PROGRESS NOTE    Teresa Krueger  PYP:[id number] DOB: Mar 25, 1934 DOA: 11/02/2020 PCP: Adin Hector, MD  Brief Narrative:   87/F morbidly obese, chronically ill with multiple medical problems namely CAD, chronic diastolic CHF, COPD, chronic lymphedema, CKD stage III AAA, type 2 diabetes mellitus, hypertension, dyslipidemia was undergoing work-up as outpatient this past week due to for presumed angina, chest pressure with shortness of breathy.  She was scheduled to have a stress test last week however due to worsening shortness of breath presented to the emergency room, dyspnea is progressive over the last 1 week, also has intermittent chest pain and pressure, in addition is also noted worsening lower extremity edema and productive cough. -In the ED she was noted to be hypoxic with evidence of fluid overload, subsequently started on BiPAP and IV Lasix -Cardiology was consulted, subsequently had mild bump in creatinine on account of which her Lasix was held temporarily -Cardiac cath which was initially considered was deferred pending stabilization of kidney function -Overnight 5/11 with respiratory distress again requiring BiPAP and IV Lasix  Assessment & Plan:   Acute hypoxic respiratory failure Acute on chronic diastolic CHF COPD Suspected OSA/OHS -Admitted with fluid overload, diuretics held briefly due to bump in creatinine however restarted again due to respiratory distress -Remains clinically volume overloaded, requiring BiPAP nightly and intermittently  -CTA chest on admission and subsequent x-rays all consistent with fluid overload -She is only net -2 L so far, continue IV Lasix, attempt higher dose today, monitor kidney function closely -She is unable to tolerate TED hose or Unna boots -Cardiology following, ischemic work-up deferred at this time -2D echo noted EF of 55%, moderate MR and moderate AR -Might be appropriate for trilogy NIPPV at discharge, discussed with patient  about this, since she lives alone she is doubtful about her ability to manage this -Ambulate, out of bed to chair as tolerated, PT following -Labs in a.m.  AKI on CKD stage IIIa -Baseline creatinine around 1.1, creatinine this admission has been in the 1.4-1.6 range -Could be cardiorenal as well as worsened by contrast for CTA on admission -Cozaar on hold -Creatinine relatively stable in the 1.7 range, monitor with continued diuresis  Chest pain/Elevated troponin History of CAD -Could be secondary to demand ischemia -Appreciate cardiology input, ischemic eval deferred in the setting of ongoing respiratory issues and renal insufficiency -Continue Plavix and statin  Insulin-dependent diabetes mellitus -Continue insulin 70/30, CBGs are stable  Hypertension Hyperlipidemia -Cozaar on hold  GERD PPI  Possible mesenteric ischemia Noted on CT angio abdomen from March Vascular surgery input appreciated, angiogram deferred in setting of renal insufficiency -Patient is tolerating p.o. intake with minimal symptoms -Vascular follow-up as outpatient  Chronic lymphedema -Worsened in the setting of  CHF  DVT prophylaxis: SQ Lovenox Code Status: DNR Family Communication: No family at bedside, updated son 5/11 Disposition Plan: Status is: Inpatient  Remains inpatient appropriate because:Inpatient level of care appropriate due to severity of illness   Dispo: The patient is from: Home              Anticipated d/c is to: Home versus short-term rehab              Patient currently is not medically stable to d/c.   Difficult to place patient No  Consultants:   Cardiology- [facility]  Vascular surgery   Procedures: None  Antimicrobials:   None    Subjective: -Felt better yesterday, this morning with some chest discomfort and shortness  of breath again  Objective: Vitals:   11/02/20 2041 11/03/20 0254 11/03/20 0601 11/03/20 0834  BP:   (!) 131/41 (!) 120/44  Pulse: 74   87 92  Resp: 16  20 16   Temp:   98.2 F (36.8 C) 98.2 F (36.8 C)  TempSrc:   Oral   SpO2: 97% 98% 100% 96%  Weight:   122.9 kg   Height:        Intake/Output Summary (Last 24 hours) at 11/03/2020 1139 Last data filed at 11/03/2020 0950 Gross per 24 hour  Intake 240 ml  Output 700 ml  Net -460 ml   Filed Weights   10/26/2020 0530 11/02/20 0500 11/03/20 0601  Weight: 125.1 kg 124.7 kg 122.9 kg    Examination:  General exam: Obese chronically ill female sitting up in bed, AAOx3, uncomfortable appearing HEENT: Neck obese unable to assess JVD CVS: S1-S2, regular rate rhythm Lungs: Fine bilateral crackles noted in mid and lower lungs Abdomen: Obese, soft, nontender, bowel sounds present Extremities: Lymphedema in both lower extremities Psych: Appropriate mood and affect  Data Reviewed: I have personally reviewed following labs and imaging studies  CBC: Recent Labs  Lab 11/10/2020 0342 10/31/20 0507 11/14/2020 0223 11/02/20 0427 11/03/20 0457  WBC 5.0 5.2 5.8 7.7 5.4  NEUTROABS 2.9 2.9 5.5 6.5 3.5  HGB 9.7* 9.4* 10.6* 9.8* 8.7*  HCT 32.1* 30.7* 34.7* 30.8* 28.3*  MCV 93.3 89.8 90.6 89.0 89.8  PLT 130* 138* 179 179 [id number]   Basic Metabolic Panel: Recent Labs  Lab 10/31/20 0507 10/31/2020 0223 11/02/20 0427 11/02/20 1555 11/03/20 0457  NA 139 136 138 137 139  K 4.8 5.1 5.4* 4.7 4.5  CL 102 100 100 98 102  CO2 27 24 28 27 27   GLUCOSE 96 269* 159* 165* 101*  BUN 73* 73* 95* 97* 98*  CREATININE 1.59* 1.57* 1.70* 1.73* 1.78*  CALCIUM 9.0 9.2 9.2 9.1 8.9   GFR: Estimated Creatinine Clearance: 27.4 mL/min (A) (by C-G formula based on SCr of 1.78 mg/dL (H)). Liver Function Tests: Recent Labs  Lab 11/02/20 0427  AST 29  ALT 18  ALKPHOS 89  BILITOT 0.6  PROT 6.8  ALBUMIN 3.6   No results for input(s): LIPASE, AMYLASE in the last 168 hours. No results for input(s): AMMONIA in the last 168 hours. Coagulation Profile: No results for input(s): INR, PROTIME in the last  168 hours. Cardiac Enzymes: No results for input(s): CKTOTAL, CKMB, CKMBINDEX, TROPONINI in the last 168 hours. BNP (last 3 results) No results for input(s): PROBNP in the last 8760 hours. HbA1C: No results for input(s): HGBA1C in the last 72 hours. CBG: Recent Labs  Lab 11/02/20 0811 11/02/20 1133 11/02/20 1654 11/02/20 1943 11/03/20 0834  GLUCAP 136* 106* 171* 133* 121*   Lipid Profile: No results for input(s): CHOL, HDL, LDLCALC, TRIG, CHOLHDL, LDLDIRECT in the last 72 hours. Thyroid Function Tests: No results for input(s): TSH, T4TOTAL, FREET4, T3FREE, THYROIDAB in the last 72 hours. Anemia Panel: No results for input(s): VITAMINB12, FOLATE, FERRITIN, TIBC, IRON, RETICCTPCT in the last 72 hours. Sepsis Labs: Recent Labs  Lab 10/28/20 0532 10/27/2020 0342  PROCALCITON <0.10 <0.10    Recent Results (from the past 240 hour(s))  SARS CORONAVIRUS 2 (TAT 6-24 HRS) Nasopharyngeal Nasopharyngeal Swab     Status: None   Collection Time: 11/02/2020  5:27 PM   Specimen: Nasopharyngeal Swab  Result Value Ref Range Status   SARS Coronavirus 2 NEGATIVE NEGATIVE Final    Comment: (NOTE)  SARS-CoV-2 target nucleic acids are NOT DETECTED.  The SARS-CoV-2 RNA is generally detectable in upper and lower respiratory specimens during the acute phase of infection. Negative results do not preclude SARS-CoV-2 infection, do not rule out co-infections with other pathogens, and should not be used as the sole basis for treatment or other patient management decisions. Negative results must be combined with clinical observations, patient history, and epidemiological information. The expected result is Negative.  Fact Sheet for Patients: SugarRoll.be  Fact Sheet for Healthcare Providers: https://www.woods-mathews.com/  This test is not yet approved or cleared by the [place] FDA and  has been authorized for detection and/or diagnosis of SARS-CoV-2  by FDA under an Emergency Use Authorization (EUA). This EUA will remain  in effect (meaning this test can be used) for the duration of the COVID-19 declaration under Se ction 564(b)(1) of the Act, 21 U.S.C. section 360bbb-3(b)(1), unless the authorization is terminated or revoked sooner.  Performed at [facility], [place] [address] [place], [place] [address]   MRSA PCR Screening     Status: None   Collection Time: 11/16/2020 11:21 PM   Specimen: Nasopharyngeal  Result Value Ref Range Status   MRSA by PCR NEGATIVE NEGATIVE Final    Comment:        The GeneXpert MRSA Assay (FDA approved for NASAL specimens only), is one component of a comprehensive MRSA colonization surveillance program. It is not intended to diagnose MRSA infection nor to guide or monitor treatment for MRSA infections. Performed at [facility], [place], [place] [address]   Expectorated Sputum Assessment w Gram Stain, Rflx to Resp Cult     Status: None   Collection Time: 11/20/2020  2:00 AM   Specimen: Sputum  Result Value Ref Range Status   Specimen Description SPUTUM  Final   Special Requests NONE  Final   Sputum evaluation   Final    Sputum specimen not acceptable for testing.  Please recollect.   Coral Ceo RN [phone] 10/25/2020 HNM Performed at [facility], [address] [place], [place] [address]    Report Status 10/31/2020 FINAL  Final     Radiology Studies: [facility] Chest Port 1 View  Result Date: 11/03/2020 CLINICAL DATA:  Hypoxia. EXAM: PORTABLE CHEST 1 VIEW COMPARISON:  Radiographs 11/16/2020 and 10/25/2020.  CT 10/28/2020. FINDINGS: 0934 hours. Right subclavian pacemaker leads appear unchanged over the right atrium and right ventricle. There is stable mild cardiomegaly and aortic atherosclerosis. There are progressive nodular airspace opacities in both lungs with small bilateral pleural effusions. No evidence of pneumothorax. The bones appear unchanged. IMPRESSION:  Progressive nodular airspace opacities in both lungs suspicious for multifocal pneumonia, possibly viral in etiology. There may be a component of pulmonary edema as well, and small bilateral pleural effusions are present. Electronically Signed   By: Richardean Sale M.D.   On: 11/03/2020 10:04    Scheduled Meds: . acetaminophen  650 mg Oral QHS  . ALPRAZolam  0.5 mg Oral BID  . budesonide (PULMICORT) nebulizer solution  0.5 mg Nebulization BID  . capsaicin   Topical BID  . chlorhexidine  15 mL Mouth Rinse BID  . Chlorhexidine Gluconate Cloth  6 each Topical Q0600  . clopidogrel  75 mg Oral Daily  . enoxaparin (LOVENOX) injection  40 mg Subcutaneous Q24H  . furosemide  80 mg Intravenous BID  . insulin aspart  0-9 Units Subcutaneous TID WC  . insulin aspart protamine- aspart  10 Units Subcutaneous BID  WC  . ipratropium-albuterol  3 mL Nebulization Q6H  . lidocaine  1 patch Transdermal Q24H  . mouth rinse  15 mL Mouth Rinse q12n4p  . multivitamin with minerals  1 tablet Oral QODAY  . mupirocin ointment   Topical BID  . pantoprazole  40 mg Oral Daily  . simvastatin  20 mg Oral q1600  . sodium chloride flush  3 mL Intravenous Q12H  . triamcinolone cream   Topical BID   Continuous Infusions: . sodium chloride       LOS: 7 days   Time spent: 25 minutes  Domenic Polite, MD [facility]  11/03/2020, 11:39 AM

## 2020-11-04 ENCOUNTER — Inpatient Hospital Stay: Payer: Medicare Other

## 2020-11-04 DIAGNOSIS — J9621 Acute and chronic respiratory failure with hypoxia: Secondary | ICD-10-CM | POA: Diagnosis not present

## 2020-11-04 LAB — BLOOD GAS, ARTERIAL
Allens test (pass/fail): POSITIVE — AB
Bicarbonate: 30.1 mmol/L — ABNORMAL HIGH (ref 20.0–28.0)
Delivery systems: POSITIVE
Expiratory PAP: 5
FIO2: 0.5
Inspiratory PAP: 10
O2 Saturation: 98.9 %
RATE: 8 resp/min
pCO2 arterial: 52 mmHg — ABNORMAL HIGH (ref 32.0–48.0)
pH, Arterial: 7.37 (ref 7.350–7.450)
pO2, Arterial: 88 mmHg (ref 83.0–108.0)

## 2020-11-04 LAB — BASIC METABOLIC PANEL
Anion gap: 11 (ref 5–15)
BUN: 96 mg/dL — ABNORMAL HIGH (ref 8–23)
CO2: 29 mmol/L (ref 22–32)
Calcium: 9 mg/dL (ref 8.9–10.3)
Chloride: 101 mmol/L (ref 98–111)
Creatinine, Ser: 1.53 mg/dL — ABNORMAL HIGH (ref 0.44–1.00)
GFR, Estimated: 33 mL/min — ABNORMAL LOW (ref >60)
Glucose, Bld: 215 mg/dL — ABNORMAL HIGH (ref 70–99)
Potassium: 4.2 mmol/L (ref 3.5–5.1)
Sodium: 141 mmol/L (ref 135–145)

## 2020-11-04 LAB — RESP PANEL BY RT-PCR (FLU A&B, COVID) ARPGX2
Influenza A by PCR: NEGATIVE
Influenza B by PCR: NEGATIVE
SARS Coronavirus 2 by RT PCR: NEGATIVE

## 2020-11-04 LAB — C-REACTIVE PROTEIN: CRP: 9.5 mg/dL — ABNORMAL HIGH (ref <1.0)

## 2020-11-04 LAB — CBC WITH DIFFERENTIAL/PLATELET
Abs Immature Granulocytes: 0.02 10*3/uL (ref 0.00–0.07)
Basophils Absolute: 0 10*3/uL (ref 0.0–0.1)
Basophils Relative: 0 %
Eosinophils Absolute: 0.2 10*3/uL (ref 0.0–0.5)
Eosinophils Relative: 4 %
HCT: 29.1 % — ABNORMAL LOW (ref 36.0–46.0)
Hemoglobin: 9 g/dL — ABNORMAL LOW (ref 12.0–15.0)
Immature Granulocytes: 0 %
Lymphocytes Relative: 9 %
Lymphs Abs: 0.5 10*3/uL — ABNORMAL LOW (ref 0.7–4.0)
MCH: 27.5 pg (ref 26.0–34.0)
MCHC: 30.9 g/dL (ref 30.0–36.0)
MCV: 89 fL (ref 80.0–100.0)
Monocytes Absolute: 0.7 10*3/uL (ref 0.1–1.0)
Monocytes Relative: 12 %
Neutro Abs: 4.2 10*3/uL (ref 1.7–7.7)
Neutrophils Relative %: 75 %
Platelets: 156 10*3/uL (ref 150–400)
RBC: 3.27 MIL/uL — ABNORMAL LOW (ref 3.87–5.11)
RDW: 13.4 % (ref 11.5–15.5)
WBC: 5.6 10*3/uL (ref 4.0–10.5)
nRBC: 0 % (ref 0.0–0.2)

## 2020-11-04 LAB — GLUCOSE, CAPILLARY
Glucose-Capillary: 243 mg/dL — ABNORMAL HIGH (ref 70–99)
Glucose-Capillary: 153 mg/dL — ABNORMAL HIGH (ref 70–99)
Glucose-Capillary: 206 mg/dL — ABNORMAL HIGH (ref 70–99)
Glucose-Capillary: 261 mg/dL — ABNORMAL HIGH (ref 70–99)

## 2020-11-04 LAB — PROCALCITONIN: Procalcitonin: 0.11 ng/mL

## 2020-11-04 MED ORDER — IPRATROPIUM-ALBUTEROL 0.5-2.5 (3) MG/3ML IN SOLN: 3.0000 mL | RESPIRATORY_TRACT | Status: DC | PRN

## 2020-11-04 MED ORDER — MORPHINE SULFATE (PF) 2 MG/ML IV SOLN
1.0000 mg | INTRAVENOUS | Status: AC
  Administered 2020-11-04: 1 mg via INTRAVENOUS
  Filled 2020-11-04: qty 1

## 2020-11-04 MED ORDER — FUROSEMIDE 10 MG/ML IJ SOLN
80.0000 mg | Freq: Two times a day (BID) | INTRAMUSCULAR | Status: AC
  Administered 2020-11-04 (×2): 80 mg via INTRAVENOUS
  Filled 2020-11-04 (×2): qty 8

## 2020-11-04 MED ORDER — IPRATROPIUM-ALBUTEROL 0.5-2.5 (3) MG/3ML IN SOLN
3.0000 mL | Freq: Four times a day (QID) | RESPIRATORY_TRACT | Status: DC
  Administered 2020-11-04 – 2020-11-05 (×4): 3 mL via RESPIRATORY_TRACT
  Filled 2020-11-04 (×3): qty 3

## 2020-11-04 MED ORDER — INSULIN ASPART PROT & ASPART (70-30 MIX) 100 UNIT/ML ~~LOC~~ SUSP
12.0000 [IU] | Freq: Two times a day (BID) | SUBCUTANEOUS | Status: DC
  Administered 2020-11-04 (×2): 12 [IU] via SUBCUTANEOUS
  Filled 2020-11-04 (×2): qty 10

## 2020-11-04 MED ORDER — METHYLPREDNISOLONE SODIUM SUCC 125 MG IJ SOLR
60.0000 mg | Freq: Two times a day (BID) | INTRAMUSCULAR | Status: DC
  Administered 2020-11-04 – 2020-11-05 (×3): 60 mg via INTRAVENOUS
  Filled 2020-11-04 (×3): qty 2

## 2020-11-04 MED ORDER — SODIUM CHLORIDE 0.9 % IV SOLN
2.0000 g | Freq: Two times a day (BID) | INTRAVENOUS | Status: DC
  Administered 2020-11-04 – 2020-11-05 (×2): 2 g via INTRAVENOUS
  Filled 2020-11-04 (×3): qty 2

## 2020-11-04 NOTE — Plan of Care (Addendum)
Rapid called by Resp about 1100.  Stated that patient is not moving air, hyper ventilating and is struggling to breath (even sats are basically remaining at baseline. Furthermore, family contacted me, RN, a few minutes prior to inform that patient just contacted the family to request that her sister not leave town today because "something is wrong and she's not doing well."     Dr. Curly Rim, STAT ABGs and 1x/dose 1mg  morphine was ordered and completed.  Family also contacted to inform of situation and thank them for calling and reporting their insights.  Patient also informed that family was contacted.  1250  CT chest and Repeat flu and covid test sent to lab. Son is now at bedside.

## 2020-11-04 NOTE — Significant Event (Addendum)
Rapid Response Event Note   Reason for Call : called RRT for increased work of breathing   Initial Focused Assessment: laying in bed, on [place], VSS, pt DNR      Interventions: Dr Broadus John notified... ordered to put pt back on bipap and get ABG, Morphine 1mg  once   Plan of Care: as above    Event Summary: RN Rodman Key, or Charge RN Colletta [place] to call for further assistance  MD Notified: [place] Dr Broadus John Call Time:1059 Arrival Time:1100 End Time:1115  Inocente Krach A, RN

## 2020-11-04 NOTE — Progress Notes (Addendum)
PROGRESS NOTE    Teresa Krueger  [id number] DOB: 12/15/1933 DOA: 10/22/2020 PCP: Adin Hector, MD  Brief Narrative:   87/F morbidly obese, chronically ill with multiple medical problems namely CAD, chronic diastolic CHF, COPD, chronic lymphedema, CKD stage III AAA, type 2 diabetes mellitus, hypertension, dyslipidemia was undergoing work-up as outpatient this past week due to for presumed angina, chest pressure with shortness of breathy.  She was scheduled to have a stress test last week however due to worsening shortness of breath presented to the emergency room, dyspnea is progressive over the last 1 week, also has intermittent chest pain and pressure, in addition is also noted worsening lower extremity edema and productive cough. -In the ED she was noted to be hypoxic with evidence of fluid overload, subsequently started on BiPAP and IV Lasix -Cardiology was consulted, subsequently had mild bump in creatinine on account of which her Lasix was held temporarily -Cardiac cath which was initially considered was deferred pending stabilization of kidney function -Overnight 5/11 with respiratory distress again requiring BiPAP and IV Lasix  Assessment & Plan:   Acute hypoxic respiratory failure Acute on chronic diastolic CHF COPD Suspected OSA/OHS -Remains clinically volume overloaded, requiring BiPAP nightly and intermittently  -CTA chest on admission and subsequent x-rays all consistent with fluid overload -Finally starting to diurese better, on higher doses of Lasix, 2.9 L negative, continue Lasix 80 mg every 12 -She is unable to tolerate TED hose or Unna boots -Cardiology following, ischemic work-up deferred at this time -2D echo noted EF of 55%, moderate MR and moderate AR -Unfortunately with slightly worsening respiratory distress overnight, chest x-ray from yesterday cannot rule out infectious component as well, will check CRP, procalcitonin, flu/COVID PCR and noncontrast CT  chest  AKI on CKD stage IIIa -Baseline creatinine around 1.1, creatinine this admission has been in the 1.4-1.6 range -Could be cardiorenal as well as worsened by contrast for CTA on admission -Cozaar on hold -Creatinine more stable, continue to monitor  Chest pain/Elevated troponin History of CAD -Could be secondary to demand ischemia -Appreciate cardiology input, ischemic eval deferred in the setting of ongoing respiratory issues and renal insufficiency -Continue statin, hold plavix from tomorrow due to intermittent hemoptysis  Insulin-dependent diabetes mellitus -Continue insulin 70/30, CBGs are stable  Hypertension Hyperlipidemia -Cozaar on hold  GERD PPI  Possible mesenteric ischemia Noted on CT angio abdomen from March Vascular surgery input appreciated, angiogram deferred in setting of renal insufficiency -Patient is tolerating p.o. intake with minimal symptoms -Vascular follow-up as outpatient  Chronic lymphedema -Worsened in the setting of  CHF  DVT prophylaxis: SQ Lovenox Code Status: DNR Family Communication: No family at bedside, updated son 5/11, will attempt to contact him today Disposition Plan: Status is: Inpatient  Remains inpatient appropriate because:Inpatient level of care appropriate due to severity of illness   Dispo: The patient is from: Home              Anticipated d/c is to: Home versus short-term rehab              Patient currently is not medically stable to d/c.   Difficult to place patient No  Consultants:   Cardiology- [facility]  Vascular surgery   Procedures: None  Antimicrobials:   None    Subjective: -Had a better day yesterday, this morning having more cough congestion and shortness of breath  Objective: Vitals:   11/04/20 0829 11/04/20 0845 11/04/20 0848 11/04/20 1112  BP:   (!) 133/50 Marland Kitchen)  135/47  Pulse:   100 96  Resp:   20 (!) 25  Temp:   98.3 F (36.8 C) 98.1 F (36.7 C)  TempSrc:   Oral   SpO2:  93%  94% 96%  Weight: 121.3 kg     Height:        Intake/Output Summary (Last 24 hours) at 11/04/2020 1436 Last data filed at 11/03/2020 2306 Gross per 24 hour  Intake 240 ml  Output 1650 ml  Net -1410 ml   Filed Weights   11/03/20 0601 11/04/20 0500 11/04/20 0829  Weight: 122.9 kg 121.6 kg 121.3 kg    Examination:  General exam: Obese chronically ill-appearing female, sitting up in bed, AAOx3, slightly uncomfortable appearing HEENT: Neck obese unable to assess JVD CVS: S1-S2, regular rate rhythm Lungs: Fine bilateral crackles in mid and lower lungs Abdomen: Soft, nontender, bowel sounds present Extremities: Lymphedema worse in the right Psych: Appropriate mood and affect   Data Reviewed: I have personally reviewed following labs and imaging studies  CBC: Recent Labs  Lab 10/31/20 0507 11/19/2020 0223 11/02/20 0427 11/03/20 0457 11/04/20 0451  WBC 5.2 5.8 7.7 5.4 5.6  NEUTROABS 2.9 5.5 6.5 3.5 4.2  HGB 9.4* 10.6* 9.8* 8.7* 9.0*  HCT 30.7* 34.7* 30.8* 28.3* 29.1*  MCV 89.8 90.6 89.0 89.8 89.0  PLT 138* 179 179 151 875   Basic Metabolic Panel: Recent Labs  Lab 11/10/2020 0223 11/02/20 0427 11/02/20 1555 11/03/20 0457 11/04/20 0451  NA 136 138 137 139 141  K 5.1 5.4* 4.7 4.5 4.2  CL 100 100 98 102 101  CO2 24 28 27 27 29   GLUCOSE 269* 159* 165* 101* 215*  BUN 73* 95* 97* 98* 96*  CREATININE 1.57* 1.70* 1.73* 1.78* 1.53*  CALCIUM 9.2 9.2 9.1 8.9 9.0   GFR: Estimated Creatinine Clearance: 31.6 mL/min (A) (by C-G formula based on SCr of 1.53 mg/dL (H)). Liver Function Tests: Recent Labs  Lab 11/02/20 0427  AST 29  ALT 18  ALKPHOS 89  BILITOT 0.6  PROT 6.8  ALBUMIN 3.6   No results for input(s): LIPASE, AMYLASE in the last 168 hours. No results for input(s): AMMONIA in the last 168 hours. Coagulation Profile: No results for input(s): INR, PROTIME in the last 168 hours. Cardiac Enzymes: No results for input(s): CKTOTAL, CKMB, CKMBINDEX, TROPONINI in the  last 168 hours. BNP (last 3 results) No results for input(s): PROBNP in the last 8760 hours. HbA1C: No results for input(s): HGBA1C in the last 72 hours. CBG: Recent Labs  Lab 11/03/20 1211 11/03/20 1548 11/03/20 2147 11/04/20 0850 11/04/20 1159  GLUCAP 104* 192* 198* 243* 153*   Lipid Profile: No results for input(s): CHOL, HDL, LDLCALC, TRIG, CHOLHDL, LDLDIRECT in the last 72 hours. Thyroid Function Tests: No results for input(s): TSH, T4TOTAL, FREET4, T3FREE, THYROIDAB in the last 72 hours. Anemia Panel: No results for input(s): VITAMINB12, FOLATE, FERRITIN, TIBC, IRON, RETICCTPCT in the last 72 hours. Sepsis Labs: Recent Labs  Lab 11/20/2020 0342 11/04/20 0451  PROCALCITON <0.10 0.11    Recent Results (from the past 240 hour(s))  SARS CORONAVIRUS 2 (TAT 6-24 HRS) Nasopharyngeal Nasopharyngeal Swab     Status: None   Collection Time: 11/03/2020  5:27 PM   Specimen: Nasopharyngeal Swab  Result Value Ref Range Status   SARS Coronavirus 2 NEGATIVE NEGATIVE Final    Comment: (NOTE) SARS-CoV-2 target nucleic acids are NOT DETECTED.  The SARS-CoV-2 RNA is generally detectable in upper and lower respiratory specimens during the  acute phase of infection. Negative results do not preclude SARS-CoV-2 infection, do not rule out co-infections with other pathogens, and should not be used as the sole basis for treatment or other patient management decisions. Negative results must be combined with clinical observations, patient history, and epidemiological information. The expected result is Negative.  Fact Sheet for Patients: SugarRoll.be  Fact Sheet for Healthcare Providers: https://www.woods-mathews.com/  This test is not yet approved or cleared by the [place] FDA and  has been authorized for detection and/or diagnosis of SARS-CoV-2 by FDA under an Emergency Use Authorization (EUA). This EUA will remain  in effect (meaning this  test can be used) for the duration of the COVID-19 declaration under Se ction 564(b)(1) of the Act, 21 U.S.C. section 360bbb-3(b)(1), unless the authorization is terminated or revoked sooner.  Performed at [facility], [place] [address], [place], [place] [address]   MRSA PCR Screening     Status: None   Collection Time: 11/07/2020 11:21 PM   Specimen: Nasopharyngeal  Result Value Ref Range Status   MRSA by PCR NEGATIVE NEGATIVE Final    Comment:        The GeneXpert MRSA Assay (FDA approved for NASAL specimens only), is one component of a comprehensive MRSA colonization surveillance program. It is not intended to diagnose MRSA infection nor to guide or monitor treatment for MRSA infections. Performed at [facility] [place] [facility], [place], [place] [address]   Expectorated Sputum Assessment w Gram Stain, Rflx to Resp Cult     Status: None   Collection Time: 11/15/2020  2:00 AM   Specimen: Sputum  Result Value Ref Range Status   Specimen Description SPUTUM  Final   Special Requests NONE  Final   Sputum evaluation   Final    Sputum specimen not acceptable for testing.  Please recollect.   Coral Ceo RN 0301 11/07/2020 HNM Performed at [facility], [address] [place], [place] [address]    Report Status 10/28/2020 FINAL  Final  Resp Panel by RT-PCR (Flu A&B, Covid) Nasopharyngeal Swab     Status: None   Collection Time: 11/04/20 12:30 PM   Specimen: Nasopharyngeal Swab; Nasopharyngeal(NP) swabs in vial transport medium  Result Value Ref Range Status   SARS Coronavirus 2 by RT PCR NEGATIVE NEGATIVE Final    Comment: (NOTE) SARS-CoV-2 target nucleic acids are NOT DETECTED.  The SARS-CoV-2 RNA is generally detectable in upper respiratory specimens during the acute phase of infection. The lowest concentration of SARS-CoV-2 viral copies this assay can detect is 138 copies/mL. A negative result does not preclude SARS-Cov-2 infection and should not be  used as the sole basis for treatment or other patient management decisions. A negative result may occur with  improper specimen collection/handling, submission of specimen other than nasopharyngeal swab, presence of viral mutation(s) within the areas targeted by this assay, and inadequate number of viral copies(<138 copies/mL). A negative result must be combined with clinical observations, patient history, and epidemiological information. The expected result is Negative.  Fact Sheet for Patients:  EntrepreneurPulse.com.au  Fact Sheet for Healthcare Providers:  IncredibleEmployment.be  This test is no t yet approved or cleared by the [place] FDA and  has been authorized for detection and/or diagnosis of SARS-CoV-2 by FDA under an Emergency Use Authorization (EUA). This EUA will remain  in effect (meaning this test can be used) for the duration of the COVID-19 declaration under Section 564(b)(1) of the Act, 21 U.S.C.section 360bbb-3(b)(1), unless the  authorization is terminated  or revoked sooner.       Influenza A by PCR NEGATIVE NEGATIVE Final   Influenza B by PCR NEGATIVE NEGATIVE Final    Comment: (NOTE) The Xpert Xpress SARS-CoV-2/FLU/RSV plus assay is intended as an aid in the diagnosis of influenza from Nasopharyngeal swab specimens and should not be used as a sole basis for treatment. Nasal washings and aspirates are unacceptable for Xpert Xpress SARS-CoV-2/FLU/RSV testing.  Fact Sheet for Patients: EntrepreneurPulse.com.au  Fact Sheet for Healthcare Providers: IncredibleEmployment.be  This test is not yet approved or cleared by the [place] FDA and has been authorized for detection and/or diagnosis of SARS-CoV-2 by FDA under an Emergency Use Authorization (EUA). This EUA will remain in effect (meaning this test can be used) for the duration of the COVID-19 declaration under Section  564(b)(1) of the Act, 21 U.S.C. section 360bbb-3(b)(1), unless the authorization is terminated or revoked.  Performed at [facility], [address] [place], [place] [address]      Radiology Studies: [facility] Chest [place] 1 View  Result Date: 11/03/2020 CLINICAL DATA:  Hypoxia. EXAM: PORTABLE CHEST 1 VIEW COMPARISON:  Radiographs 11-14-20 and 11/14/2020.  CT 10/28/2020. FINDINGS: 0934 hours. Right subclavian pacemaker leads appear unchanged over the right atrium and right ventricle. There is stable mild cardiomegaly and aortic atherosclerosis. There are progressive nodular airspace opacities in both lungs with small bilateral pleural effusions. No evidence of pneumothorax. The bones appear unchanged. IMPRESSION: Progressive nodular airspace opacities in both lungs suspicious for multifocal pneumonia, possibly viral in etiology. There may be a component of pulmonary edema as well, and small bilateral pleural effusions are present. Electronically Signed   By: Richardean Sale M.D.   On: 11/03/2020 10:04    Scheduled Meds: . acetaminophen  650 mg Oral QHS  . ALPRAZolam  0.5 mg Oral BID  . budesonide (PULMICORT) nebulizer solution  0.5 mg Nebulization BID  . capsaicin   Topical BID  . chlorhexidine  15 mL Mouth Rinse BID  . Chlorhexidine Gluconate Cloth  6 each Topical Q0600  . clopidogrel  75 mg Oral Daily  . enoxaparin (LOVENOX) injection  40 mg Subcutaneous Q24H  . furosemide  80 mg Intravenous BID  . insulin aspart  0-9 Units Subcutaneous TID WC  . insulin aspart protamine- aspart  12 Units Subcutaneous BID WC  . ipratropium-albuterol  3 mL Nebulization QID  . lidocaine  1 patch Transdermal Q24H  . mouth rinse  15 mL Mouth Rinse q12n4p  . methylPREDNISolone (SOLU-MEDROL) injection  60 mg Intravenous Q12H  . multivitamin with minerals  1 tablet Oral QODAY  . mupirocin ointment   Topical BID  . pantoprazole  40 mg Oral Daily  . simvastatin  20 mg Oral q1600  . sodium chloride  flush  3 mL Intravenous Q12H  . triamcinolone cream   Topical BID   Continuous Infusions: . sodium chloride       LOS: 8 days   Time spent: 25 minutes  Domenic Polite, MD [facility]  11/04/2020, 2:36 PM

## 2020-11-04 NOTE — Progress Notes (Signed)
PT Cancellation Note  Patient Details Name: Teresa Krueger MRN: [id number] DOB: 10-01-1933   Cancelled Treatment:    Reason Eval/Treat Not Completed: Other (comment)  Per chart, pt with RR called this morning & pt placed back on bipap. Will hold PT treatment & reattempt once pt is medically stable.  Lavone Nian, PT, DPT 11/04/20, 12:17 PM   Waunita Schooner 11/04/2020, 12:16 PM

## 2020-11-04 NOTE — Progress Notes (Signed)
Pharmacy Antibiotic Note  Teresa Krueger is a 87 y.o. female with multiple medical problems namely CAD, chronic diastolic CHF, COPD, chronic lymphedema, CKD stage III AAA, type 2 diabetes mellitus, hypertension, dyslipidemia was undergoing work-up as outpatient this past week due to for presumed angina, chest pressure with shortness of breathy. Pharmacy has been consulted for cefepime dosing for pneumonia.  5/13 CXR: Progressive nodular airspace opacities in both lungs suspicious for multifocal pneumonia, possibly viral in etiology.  Plan:  Start cefepime 2g IV q12h  Monitor renal function and adjust dose as clinically indicated  Follow up cultures and de-escalate antibiotics as appropriate  Height: 4\' 11"  (149.9 cm) Weight: 121.3 kg (267 lb 8 oz) IBW/kg (Calculated) : 43.2  Temp (24hrs), Avg:98 F (36.7 C), Min:97.6 F (36.4 C), Max:98.3 F (36.8 C)  Recent Labs  Lab 10/31/20 0507 10/25/2020 0223 11/02/20 0427 11/02/20 1555 11/03/20 0457 11/04/20 0451  WBC 5.2 5.8 7.7  --  5.4 5.6  CREATININE 1.59* 1.57* 1.70* 1.73* 1.78* 1.53*    Estimated Creatinine Clearance: 31.6 mL/min (A) (by C-G formula based on SCr of 1.53 mg/dL (H)).    Allergies  Allergen Reactions  . Aspirin Swelling and Other (See Comments)    Pt states that she has tongue swelling.    . Celebrex [Celecoxib] Anaphylaxis  . Meloxicam Swelling  . Nsaids Anaphylaxis, Other (See Comments) and Nausea And Vomiting    unknown   . Quetiapine Anaphylaxis    intolerant  . Rofecoxib Anaphylaxis  . Sulfa Antibiotics Hives, Itching, Anaphylaxis, Nausea And Vomiting and Other (See Comments)  . Tolmetin Anaphylaxis  . Bactrim [Sulfamethoxazole-Trimethoprim] Hives and Itching  . Biguanide Copolymer Other (See Comments)    Reaction:  Unknown   . Buprenorphine Hcl Other (See Comments)    Reaction:  Unknown   . Cetirizine Other (See Comments)  . Ciprofloxacin Other (See Comments)    Reaction:  Unknown   . Codeine  Nausea And Vomiting and Other (See Comments)    Reaction:  Syncope   . Dextromethorphan Hbr Other (See Comments)    Reaction:  GI upset   . Dextromethorphan Polistirex Er Other (See Comments)    Reaction:  GI upset  GI upset  . Furosemide Other (See Comments)    Reaction:  Unknown   . Hydrocodone Other (See Comments)    Reaction:  Unknown   . Metformin Other (See Comments)    Reaction:  Unknown   . Morphine And Related Other (See Comments)    Reaction:  Unknown   . Penicillins Swelling and Other (See Comments)    Pt did not answer the additional questions for this medication because she does not remember.    . Pregabalin Other (See Comments)    Reaction:  Unknown   . Promethazine Other (See Comments)    Reaction:  Unknown   . Propoxyphene Other (See Comments)    Reaction:  Unknown   . Propoxyphene Other (See Comments)  . Quinolones Other (See Comments)    Reaction:  Unknown   . Seroquel [Quetiapine Fumarate] Other (See Comments)    Reaction:  Unknown   . Serotonin Reuptake Inhibitors (Ssris) Other (See Comments)    Reaction:  Unknown   . Tramadol Nausea Only  . Ultracet [Tramadol-Acetaminophen] Other (See Comments)    Reaction:  Unknown     Antimicrobials this admission: 5/10 azithromycin >> 5/11 5/14 cefepime >>  Dose adjustments this admission:   Microbiology results: 5/6 MRSA PCR: negative  Thank you for  allowing pharmacy to be a part of this patient's care.  Sherilyn Banker, PharmD Pharmacy Resident  11/04/2020 5:15 PM

## 2020-11-05 DIAGNOSIS — J9621 Acute and chronic respiratory failure with hypoxia: Secondary | ICD-10-CM | POA: Diagnosis not present

## 2020-11-05 LAB — CBC WITH DIFFERENTIAL/PLATELET
Abs Immature Granulocytes: 0.05 10*3/uL (ref 0.00–0.07)
Basophils Absolute: 0 10*3/uL (ref 0.0–0.1)
Basophils Relative: 0 %
Eosinophils Absolute: 0 10*3/uL (ref 0.0–0.5)
Eosinophils Relative: 0 %
HCT: 29.8 % — ABNORMAL LOW (ref 36.0–46.0)
Hemoglobin: 9.5 g/dL — ABNORMAL LOW (ref 12.0–15.0)
Immature Granulocytes: 1 %
Lymphocytes Relative: 2 %
Lymphs Abs: 0.2 10*3/uL — ABNORMAL LOW (ref 0.7–4.0)
MCH: 28 pg (ref 26.0–34.0)
MCHC: 31.9 g/dL (ref 30.0–36.0)
MCV: 87.9 fL (ref 80.0–100.0)
Monocytes Absolute: 0.4 10*3/uL (ref 0.1–1.0)
Monocytes Relative: 4 %
Neutro Abs: 10.3 10*3/uL — ABNORMAL HIGH (ref 1.7–7.7)
Neutrophils Relative %: 93 %
Platelets: 177 10*3/uL (ref 150–400)
RBC: 3.39 MIL/uL — ABNORMAL LOW (ref 3.87–5.11)
RDW: 13.4 % (ref 11.5–15.5)
WBC: 11 10*3/uL — ABNORMAL HIGH (ref 4.0–10.5)
nRBC: 0 % (ref 0.0–0.2)

## 2020-11-05 LAB — BASIC METABOLIC PANEL
Anion gap: 15 (ref 5–15)
BUN: 99 mg/dL — ABNORMAL HIGH (ref 8–23)
CO2: 25 mmol/L (ref 22–32)
Calcium: 9.1 mg/dL (ref 8.9–10.3)
Chloride: 100 mmol/L (ref 98–111)
Creatinine, Ser: 1.62 mg/dL — ABNORMAL HIGH (ref 0.44–1.00)
GFR, Estimated: 31 mL/min — ABNORMAL LOW (ref >60)
Glucose, Bld: 301 mg/dL — ABNORMAL HIGH (ref 70–99)
Potassium: 4.6 mmol/L (ref 3.5–5.1)
Sodium: 140 mmol/L (ref 135–145)

## 2020-11-05 LAB — GLUCOSE, CAPILLARY
Glucose-Capillary: 289 mg/dL — ABNORMAL HIGH (ref 70–99)
Glucose-Capillary: 395 mg/dL — ABNORMAL HIGH (ref 70–99)
Glucose-Capillary: 282 mg/dL — ABNORMAL HIGH (ref 70–99)
Glucose-Capillary: 387 mg/dL — ABNORMAL HIGH (ref 70–99)

## 2020-11-05 LAB — PROCALCITONIN: Procalcitonin: 0.18 ng/mL

## 2020-11-05 MED ORDER — LEVALBUTEROL HCL 0.63 MG/3ML IN NEBU
INHALATION_SOLUTION | RESPIRATORY_TRACT | Status: AC
  Administered 2020-11-05: 0.63 mg
  Filled 2020-11-05: qty 3

## 2020-11-05 MED ORDER — HEPARIN SODIUM (PORCINE) 5000 UNIT/ML IJ SOLN
5000.0000 [IU] | Freq: Two times a day (BID) | INTRAMUSCULAR | Status: DC
  Administered 2020-11-05 – 2020-11-06 (×3): 5000 [IU] via SUBCUTANEOUS
  Filled 2020-11-05 (×3): qty 1

## 2020-11-05 MED ORDER — INSULIN ASPART PROT & ASPART (70-30 MIX) 100 UNIT/ML ~~LOC~~ SUSP
15.0000 [IU] | Freq: Two times a day (BID) | SUBCUTANEOUS | Status: DC
  Administered 2020-11-05: 15 [IU] via SUBCUTANEOUS
  Filled 2020-11-05: qty 10

## 2020-11-05 MED ORDER — FUROSEMIDE 10 MG/ML IJ SOLN
80.0000 mg | Freq: Two times a day (BID) | INTRAMUSCULAR | Status: AC
  Administered 2020-11-05 (×2): 80 mg via INTRAVENOUS
  Filled 2020-11-05 (×2): qty 8

## 2020-11-05 MED ORDER — INSULIN ASPART PROT & ASPART (70-30 MIX) 100 UNIT/ML ~~LOC~~ SUSP
18.0000 [IU] | Freq: Two times a day (BID) | SUBCUTANEOUS | Status: DC
  Administered 2020-11-05 – 2020-11-06 (×3): 18 [IU] via SUBCUTANEOUS
  Filled 2020-11-05 (×3): qty 10

## 2020-11-05 MED ORDER — LEVALBUTEROL HCL 0.63 MG/3ML IN NEBU
0.6300 mg | INHALATION_SOLUTION | Freq: Four times a day (QID) | RESPIRATORY_TRACT | Status: DC | PRN
  Administered 2020-11-06 – 2020-11-18 (×14): 0.63 mg via RESPIRATORY_TRACT
  Filled 2020-11-05 (×15): qty 3

## 2020-11-05 MED ORDER — METHYLPREDNISOLONE SODIUM SUCC 40 MG IJ SOLR
40.0000 mg | Freq: Every day | INTRAMUSCULAR | Status: DC
  Administered 2020-11-06 – 2020-11-08 (×3): 40 mg via INTRAVENOUS
  Filled 2020-11-05 (×3): qty 1

## 2020-11-05 MED ORDER — ALBUMIN HUMAN 25 % IV SOLN
12.5000 g | Freq: Every day | INTRAVENOUS | Status: DC
  Administered 2020-11-05: 12.5 g via INTRAVENOUS
  Filled 2020-11-05: qty 50

## 2020-11-05 NOTE — Progress Notes (Signed)
Slept periodically through the nigh.Awakwening several times for request to adjust the BiPAP.Patient turned and repositioned. IV dressing change. No distress through the night. Extensive education for fluid intake. IV?ABT/PNA started without difficulties noted. Will endorse to oncoming RN.

## 2020-11-05 NOTE — Progress Notes (Signed)
PROGRESS NOTE    Teresa Krueger  DGU:[id number] DOB: January 16, 1934 DOA: 11/08/2020 PCP: Lynnea Ferrier, MD  Brief Narrative:   87/F morbidly obese, chronically ill with multiple medical problems namely CAD, chronic diastolic CHF, COPD, chronic lymphedema, CKD stage III AAA, type 2 diabetes mellitus, hypertension, dyslipidemia was undergoing work-up as outpatient this past week due to for presumed angina, chest pressure with shortness of breathy.  She was scheduled to have a stress test last week however due to worsening shortness of breath presented to the emergency room, dyspnea is progressive over the last 1 week, also has intermittent chest pain and pressure, in addition is also noted worsening lower extremity edema and productive cough. -In the ED she was noted to be hypoxic with evidence of fluid overload, subsequently started on BiPAP and IV Lasix -Cardiology was consulted, subsequently had mild bump in creatinine on account of which her Lasix was held temporarily -Cardiac cath which was initially considered was deferred pending stabilization of kidney function -Overnight 5/11 with respiratory distress again requiring BiPAP and IV Lasix  Assessment & Plan:   Acute hypoxic respiratory failure Acute on chronic diastolic CHF COPD Suspected OSA/OHS Mild hemoptysis -Remains clinically volume overloaded, requiring BiPAP nightly and intermittently  -CTA chest on admission and subsequent x-rays all consistent with fluid overload -Finally starting to diurese better, on higher doses of Lasix, 4.9 L negative -She is unable to tolerate TED hose or Unna boots -Cardiology following, ischemic work-up deferred at this time -2D echo noted EF of 55%, moderate MR and moderate AR -Had worsening respiratory distress 5/14, CT with worsening groundglass opacities, pulmonary input requested as well, CRP is high, procalcitonin is low -Discontinuing antibiotics, hold Plavix on account of low-grade  hemoptysis -Incentive spirometer, duo nebs, out of bed -BiPAP nightly  AKI on CKD stage IIIa -Baseline creatinine around 1.1, creatinine this admission has been in the 1.4-1.6 range -Could be cardiorenal as well as worsened by contrast for CTA on admission -Cozaar on hold -Creatinine more stable, continue to monitor  Chest pain/Elevated troponin History of CAD -Could be secondary to demand ischemia -Appreciate cardiology input, ischemic eval deferred in the setting of ongoing respiratory issues and renal insufficiency -Continue statin  History of TIA -Plavix held due to intermittent hemoptysis  Insulin-dependent diabetes mellitus -Continue insulin 70/30, CBGs are stable  Hypertension Hyperlipidemia -Cozaar on hold  GERD PPI  Possible mesenteric ischemia Noted on CT angio abdomen from March Vascular surgery input appreciated, angiogram deferred in setting of renal insufficiency -Patient is tolerating p.o. intake with minimal symptoms -Vascular follow-up as outpatient  Chronic lymphedema -Worsened in the setting of  CHF  DVT prophylaxis: SQ Lovenox Code Status: DNR Family Communication: No family at bedside, called and updated son Jake Shark Disposition Plan: Status is: Inpatient  Remains inpatient appropriate because:Inpatient level of care appropriate due to severity of illness   Dispo: The patient is from: Home              Anticipated d/c is to: Home versus short-term rehab              Patient currently is not medically stable to d/c.   Difficult to place patient No  Consultants:   Cardiology- [facility]  Vascular surgery   Procedures: None  Antimicrobials:   None    Subjective: -Had a better day yesterday, this morning having more cough congestion and shortness of breath  Objective: Vitals:   11/05/20 0449 11/05/20 0450 11/05/20 0751 11/05/20 1123  BP: (!) 146/55  113/69 (!) 136/59  Pulse: 74  65 87  Resp: 20  19 18   Temp: 97.7 F (36.5  C)  98.1 F (36.7 C) 98.4 F (36.9 C)  TempSrc: Oral   Oral  SpO2: 97%  98% 100%  Weight:  122.8 kg    Height:        Intake/Output Summary (Last 24 hours) at 11/05/2020 1139 Last data filed at 11/05/2020 0900 Gross per 24 hour  Intake 460 ml  Output 1900 ml  Net -1440 ml   Filed Weights   11/04/20 0500 11/04/20 0829 11/05/20 0450  Weight: 121.6 kg 121.3 kg 122.8 kg    Examination:  General exam: Obese chronically ill-appearing female, sitting up in bed, AAOx3, slightly uncomfortable appearing HEENT: Neck obese unable to assess JVD CVS: S1-S2, regular rate rhythm Lungs: Fine bilateral crackles in mid and lower lungs Abdomen: Soft, nontender, bowel sounds present Extremities: Lymphedema worse in the right Psych: Appropriate mood and affect   Data Reviewed: I have personally reviewed following labs and imaging studies  CBC: Recent Labs  Lab 11/19/2020 0223 11/02/20 0427 11/03/20 0457 11/04/20 0451 11/05/20 0544  WBC 5.8 7.7 5.4 5.6 11.0*  NEUTROABS 5.5 6.5 3.5 4.2 10.3*  HGB 10.6* 9.8* 8.7* 9.0* 9.5*  HCT 34.7* 30.8* 28.3* 29.1* 29.8*  MCV 90.6 89.0 89.8 89.0 87.9  PLT 179 179 151 156 295   Basic Metabolic Panel: Recent Labs  Lab 11/02/20 0427 11/02/20 1555 11/03/20 0457 11/04/20 0451 11/05/20 0617  NA 138 137 139 141 140  K 5.4* 4.7 4.5 4.2 4.6  CL 100 98 102 101 100  CO2 28 27 27 29 25   GLUCOSE 159* 165* 101* 215* 301*  BUN 95* 97* 98* 96* 99*  CREATININE 1.70* 1.73* 1.78* 1.53* 1.62*  CALCIUM 9.2 9.1 8.9 9.0 9.1   GFR: Estimated Creatinine Clearance: 30.1 mL/min (A) (by C-G formula based on SCr of 1.62 mg/dL (H)). Liver Function Tests: Recent Labs  Lab 11/02/20 0427  AST 29  ALT 18  ALKPHOS 89  BILITOT 0.6  PROT 6.8  ALBUMIN 3.6   No results for input(s): LIPASE, AMYLASE in the last 168 hours. No results for input(s): AMMONIA in the last 168 hours. Coagulation Profile: No results for input(s): INR, PROTIME in the last 168 hours. Cardiac  Enzymes: No results for input(s): CKTOTAL, CKMB, CKMBINDEX, TROPONINI in the last 168 hours. BNP (last 3 results) No results for input(s): PROBNP in the last 8760 hours. HbA1C: No results for input(s): HGBA1C in the last 72 hours. CBG: Recent Labs  Lab 11/04/20 1159 11/04/20 1623 11/04/20 2035 11/05/20 0749 11/05/20 1125  GLUCAP 153* 206* 261* 289* 395*   Lipid Profile: No results for input(s): CHOL, HDL, LDLCALC, TRIG, CHOLHDL, LDLDIRECT in the last 72 hours. Thyroid Function Tests: No results for input(s): TSH, T4TOTAL, FREET4, T3FREE, THYROIDAB in the last 72 hours. Anemia Panel: No results for input(s): VITAMINB12, FOLATE, FERRITIN, TIBC, IRON, RETICCTPCT in the last 72 hours. Sepsis Labs: Recent Labs  Lab 11/20/2020 0342 11/04/20 0451 11/05/20 0617  PROCALCITON <0.10 0.11 0.18    Recent Results (from the past 240 hour(s))  SARS CORONAVIRUS 2 (TAT 6-24 HRS) Nasopharyngeal Nasopharyngeal Swab     Status: None   Collection Time: 11/15/2020  5:27 PM   Specimen: Nasopharyngeal Swab  Result Value Ref Range Status   SARS Coronavirus 2 NEGATIVE NEGATIVE Final    Comment: (NOTE) SARS-CoV-2 target nucleic acids are NOT DETECTED.  The SARS-CoV-2 RNA  is generally detectable in upper and lower respiratory specimens during the acute phase of infection. Negative results do not preclude SARS-CoV-2 infection, do not rule out co-infections with other pathogens, and should not be used as the sole basis for treatment or other patient management decisions. Negative results must be combined with clinical observations, patient history, and epidemiological information. The expected result is Negative.  Fact Sheet for Patients: SugarRoll.be  Fact Sheet for Healthcare Providers: https://www.woods-mathews.com/  This test is not yet approved or cleared by the [place] FDA and  has been authorized for detection and/or diagnosis of SARS-CoV-2  by FDA under an Emergency Use Authorization (EUA). This EUA will remain  in effect (meaning this test can be used) for the duration of the COVID-19 declaration under Se ction 564(b)(1) of the Act, 21 U.S.C. section 360bbb-3(b)(1), unless the authorization is terminated or revoked sooner.  Performed at [facility], [place] [address] [place], [place] [address]   MRSA PCR Screening     Status: None   Collection Time: 10/25/2020 11:21 PM   Specimen: Nasopharyngeal  Result Value Ref Range Status   MRSA by PCR NEGATIVE NEGATIVE Final    Comment:        The GeneXpert MRSA Assay (FDA approved for NASAL specimens only), is one component of a comprehensive MRSA colonization surveillance program. It is not intended to diagnose MRSA infection nor to guide or monitor treatment for MRSA infections. Performed at [facility], [place], [place] [address]   Expectorated Sputum Assessment w Gram Stain, Rflx to Resp Cult     Status: None   Collection Time: 10/31/2020  2:00 AM   Specimen: Sputum  Result Value Ref Range Status   Specimen Description SPUTUM  Final   Special Requests NONE  Final   Sputum evaluation   Final    Sputum specimen not acceptable for testing.  Please recollect.   Coral Ceo RN [id number] 11/13/2020 HNM Performed at [facility], [address] [place], [place] [address]    Report Status 11/10/2020 FINAL  Final  Resp Panel by RT-PCR (Flu A&B, Covid) Nasopharyngeal Swab     Status: None   Collection Time: 11/04/20 12:30 PM   Specimen: Nasopharyngeal Swab; Nasopharyngeal(NP) swabs in vial transport medium  Result Value Ref Range Status   SARS Coronavirus 2 by RT PCR NEGATIVE NEGATIVE Final    Comment: (NOTE) SARS-CoV-2 target nucleic acids are NOT DETECTED.  The SARS-CoV-2 RNA is generally detectable in upper respiratory specimens during the acute phase of infection. The lowest concentration of SARS-CoV-2 viral copies this assay can  detect is 138 copies/mL. A negative result does not preclude SARS-Cov-2 infection and should not be used as the sole basis for treatment or other patient management decisions. A negative result may occur with  improper specimen collection/handling, submission of specimen other than nasopharyngeal swab, presence of viral mutation(s) within the areas targeted by this assay, and inadequate number of viral copies(<138 copies/mL). A negative result must be combined with clinical observations, patient history, and epidemiological information. The expected result is Negative.  Fact Sheet for Patients:  EntrepreneurPulse.com.au  Fact Sheet for Healthcare Providers:  IncredibleEmployment.be  This test is no t yet approved or cleared by the [place] FDA and  has been authorized for detection and/or diagnosis of SARS-CoV-2 by FDA under an Emergency Use Authorization (EUA). This EUA will remain  in effect (meaning this test can be used) for the duration of the COVID-19 declaration  under Section 564(b)(1) of the Act, 21 U.S.C.section 360bbb-3(b)(1), unless the authorization is terminated  or revoked sooner.       Influenza A by PCR NEGATIVE NEGATIVE Final   Influenza B by PCR NEGATIVE NEGATIVE Final    Comment: (NOTE) The Xpert Xpress SARS-CoV-2/FLU/RSV plus assay is intended as an aid in the diagnosis of influenza from Nasopharyngeal swab specimens and should not be used as a sole basis for treatment. Nasal washings and aspirates are unacceptable for Xpert Xpress SARS-CoV-2/FLU/RSV testing.  Fact Sheet for Patients: EntrepreneurPulse.com.au  Fact Sheet for Healthcare Providers: IncredibleEmployment.be  This test is not yet approved or cleared by the [place] FDA and has been authorized for detection and/or diagnosis of SARS-CoV-2 by FDA under an Emergency Use Authorization (EUA). This EUA will remain in  effect (meaning this test can be used) for the duration of the COVID-19 declaration under Section 564(b)(1) of the Act, 21 U.S.C. section 360bbb-3(b)(1), unless the authorization is terminated or revoked.  Performed at [facility], [address] [place], [place] [address]      Radiology Studies: CT CHEST WO CONTRAST  Result Date: 11/04/2020 CLINICAL DATA:  Respiratory failure, worsening hypoxia, CHF, concern for infectious process EXAM: CT CHEST WITHOUT CONTRAST TECHNIQUE: Multidetector CT imaging of the chest was performed following the standard protocol without IV contrast. COMPARISON:  10/28/2020 FINDINGS: Cardiovascular: Aortic atherosclerosis. Right chest multi lead pacer. Cardiomegaly. Left and right coronary artery calcifications. No pericardial effusion. Mediastinum/Nodes: No enlarged mediastinal, hilar, or axillary lymph nodes. Thyroid gland, trachea, and esophagus demonstrate no significant findings. Lungs/Pleura: Small to moderate bilateral pleural effusions and associated atelectasis or consolidation, slightly increased compared to prior examination. Very extensive heterogeneous and ground-glass airspace opacity throughout the lungs, substantially increased compared to prior examination. Mild interlobular septal thickening. Upper Abdomen: No acute abnormality. Musculoskeletal: No chest wall mass or suspicious bone lesions identified. IMPRESSION: 1. Small to moderate bilateral pleural effusions and associated atelectasis or consolidation, slightly increased compared to prior examination. 2. Very extensive heterogeneous and ground-glass airspace opacity throughout the lungs, substantially increased compared to prior examination. Mild interlobular septal thickening. Findings are consistent with worsened multifocal infection, edema, and/or ARDS. 3. Cardiomegaly and coronary artery disease. Aortic Atherosclerosis (ICD10-I70.0). Electronically Signed   By: Eddie Candle M.D.   On:  11/04/2020 16:32    Scheduled Meds: . acetaminophen  650 mg Oral QHS  . ALPRAZolam  0.5 mg Oral BID  . capsaicin   Topical BID  . chlorhexidine  15 mL Mouth Rinse BID  . Chlorhexidine Gluconate Cloth  6 each Topical Q0600  . furosemide  80 mg Intravenous BID  . heparin injection (subcutaneous)  5,000 Units Subcutaneous Q12H  . insulin aspart  0-9 Units Subcutaneous TID WC  . insulin aspart protamine- aspart  15 Units Subcutaneous BID WC  . lidocaine  1 patch Transdermal Q24H  . mouth rinse  15 mL Mouth Rinse q12n4p  . methylPREDNISolone (SOLU-MEDROL) injection  40 mg Intravenous Daily  . multivitamin with minerals  1 tablet Oral QODAY  . mupirocin ointment   Topical BID  . pantoprazole  40 mg Oral Daily  . simvastatin  20 mg Oral q1600  . sodium chloride flush  3 mL Intravenous Q12H  . triamcinolone cream   Topical BID   Continuous Infusions: . sodium chloride    . albumin human       LOS: 9 days   Time spent: 25 minutes  Domenic Polite, MD [facility]  11/05/2020, 11:39  AM  

## 2020-11-05 NOTE — Progress Notes (Signed)
   11/08/2020 0135  Assess: MEWS Score  Pulse Rate (!) 104  Resp (!) 30  Level of Consciousness Alert  SpO2 98 %  O2 Device Nasal Cannula  O2 Flow Rate (L/min) 4 L/min  Assess: MEWS Score  MEWS Temp 0  MEWS Systolic 0  MEWS Pulse 1  MEWS RR 2  MEWS LOC 0  MEWS Score 3  MEWS Score Color Yellow  Assess: if the MEWS score is Yellow or Red  Were vital signs taken at a resting state? Yes  Focused Assessment Change from prior assessment (see assessment flowsheet)  Early Detection of Sepsis Score *See Row Information* Medium  MEWS guidelines implemented *See Row Information* Yes  Treat  MEWS Interventions Consulted Respiratory Therapy;Administered prn meds/treatments;Escalated (See documentation below)  Take Vital Signs  Increase Vital Sign Frequency  Yellow: Q 2hr X 2 then Q 4hr X 2, if remains yellow, continue Q 4hrs  Escalate  MEWS: Escalate Yellow: discuss with charge nurse/RN and consider discussing with provider and RRT  Notify: Charge Nurse/RN  Name of Charge Nurse/RN Notified Melissa, RN  Date Charge Nurse/RN Notified 11/04/2020  Time Charge Nurse/RN Notified [id number]  Notify: Provider  Provider Name/Title Rachael Fee  Date Provider Notified 11/17/2020  Time Provider Notified 0139  Notification Type Page (secure text)  Notification Reason Change in status  Provider response See new orders  Date of Provider Response 11/20/2020  Time of Provider Response 0140  Assess: SIRS CRITERIA  SIRS Temperature  0  SIRS Pulse 1  SIRS Respirations  1  SIRS WBC 0  SIRS Score Sum  2  Inserted for Jonelle Sports RN

## 2020-11-05 NOTE — Consult Note (Signed)
Pulmonary Medicine          Date: 11/05/2020,   MRN# [id number] Teresa Krueger Aug 08, 1933     AdmissionWeight: 117 kg                 CurrentWeight: 122.8 kg   Referring physician: Dr Broadus John   CHIEF COMPLAINT:   Acute on chronic hypoxemic respiratory failure   HISTORY OF PRESENT ILLNESS   Teresa Krueger is a 87 y.o. female with medical history significant of coronary artery disease, chronic lymphedema, COPD, chronic kidney disease stage III, hyperlipidemia, hypertension, diabetes and morbid obesity who was being worked up for angina at home.  Patient was supposed to have a cardiac stress test this week.  She started having chest pressure today shortness of breath.  The shortness of breath has been progressive over the last week.  Currently present even at rest without exertion.  She has pain that is been going on in the left side going to the neck and the back.  It is more pressure that is worse with exertion.  Patient also has noted progressive worsening of leg swelling.  She has cough and frothy sputum.  No fever or chills.  No sick contact.  Patient uses oxygen only at night at about 2 L.  Patient also recently was set up with a Holter monitor which she completed but the results are still pending.  She came to the ER where she was found to be hypoxic evidence of fluid overload as well as anxiety.  Patient progressively got worse in the ER and now is on BiPAP.  She is hypoxic and appears to have evidence of anasarca.  She has been admitted to the hospital for angina with acute on chronic respiratory failure with hypoxia, temperature 98 blood pressure 153/55, pulse 94 respirate of 29 oxygen sat 92% on room air.  Currently 96% on BiPAP.  Hemoglobin is 9.4.  Potassium 5.2.  BUN 63 and creatinine 1.40.  Glucose is 210.  BNP is 593.  Initial troponin 24-second troponin 25.  COVID-19 screen is currently pending.  Chest x-ray showed slight left midlung atelectasis.  No edema or  airspace opacity. Despite several days of diuresis her respiratory status is not improved and she continues to have hemoptysis with consult placed for PCCM for additional evaluation from pulmonary perspective.    PAST MEDICAL HISTORY   Past Medical History:  Diagnosis Date  . Arthritis   . Asthma   . Barrett's esophagus   . Breast cancer ([place]) 1999   LT MASTECTOMY  . Bronchiectasis ([place])   . COPD (chronic obstructive pulmonary disease) ([place])   . Diabetes mellitus ([place])   . Hypercholesterolemia   . Hypertension   . Lymphedema   . Mini stroke ([place])   . Osteoporosis   . Pacemaker      SURGICAL HISTORY   Past Surgical History:  Procedure Laterality Date  . CARPAL TUNNEL RELEASE     right  . CATARACT EXTRACTION     bilateral  . GALLBLADDER SURGERY    . MASTECTOMY Left 1999   BREAST CA  . PACEMAKER INSERTION Right 03/21/2015   Procedure: INSERTION PACEMAKER;  Surgeon: Isaias Cowman, MD;  Location: [facility] ORS;  Service: Cardiovascular;  Laterality: Right;  . TOTAL HIP ARTHROPLASTY  2001   left  . TRIGGER FINGER RELEASE     bilateral     FAMILY HISTORY   Family History  Problem Relation Age of Onset  .  Heart disease Paternal Grandmother   . Diabetes Paternal Grandmother   . Heart disease Father   . Heart disease Mother   . Heart disease Maternal Grandmother   . Stroke Maternal Aunt   . Breast cancer Paternal Aunt 94  . Lung cancer Brother 67  . Kidney disease Cousin   . Bladder Cancer Neg Hx      SOCIAL HISTORY   Social History   Tobacco Use  . Smoking status: Former Smoker    Packs/day: 1.00    Years: 30.00    Pack years: 30.00    Types: Cigarettes    Quit date: 12/27/1992    Years since quitting: 27.8  . Smokeless tobacco: Never Used  Substance Use Topics  . Alcohol use: No  . Drug use: No     MEDICATIONS    Home Medication:    Current Medication:  Current Facility-Administered Medications:  .  0.9 %  sodium chloride infusion, 250 mL,  Intravenous, PRN, Teodoro Spray, MD .  acetaminophen (TYLENOL) tablet 650 mg, 650 mg, Oral, Q4H PRN, Elwyn Reach, MD, 650 mg at 11/03/20 0945 .  acetaminophen (TYLENOL) tablet 650 mg, 650 mg, Oral, QHS, Domenic Polite, MD, 650 mg at 11/04/20 2124 .  ALPRAZolam (XANAX) tablet 0.5 mg, 0.5 mg, Oral, BID, 0.5 mg at 11/05/20 1012 **AND** ALPRAZolam (XANAX) tablet 0.5 mg, 0.5 mg, Oral, Daily PRN, Renda Rolls, RPH, 0.5 mg at 11/04/20 1122 .  budesonide (PULMICORT) nebulizer solution 0.5 mg, 0.5 mg, Nebulization, BID, Sreenath, Sudheer B, MD, 0.5 mg at 11/05/20 0836 .  calcium carbonate (TUMS - dosed in mg elemental calcium) chewable tablet 200 mg of elemental calcium, 1 tablet, Oral, PRN, Jonelle Sidle, Mohammad L, MD, 200 mg of elemental calcium at 11/02/20 0928 .  capsaicin (ZOSTRIX) 0.025 % cream, , Topical, BID, Priscella Mann, Sudheer B, MD, Given at 11/05/20 1012 .  ceFEPIme (MAXIPIME) 2 g in sodium chloride 0.9 % 100 mL IVPB, 2 g, Intravenous, Q12H, Rauer, Samantha O, RPH, Last Rate: 200 mL/hr at 11/05/20 1029, 2 g at 11/05/20 1029 .  chlorhexidine (PERIDEX) 0.12 % solution 15 mL, 15 mL, Mouth Rinse, BID, Garba, Mohammad L, MD, 15 mL at 11/05/20 1013 .  Chlorhexidine Gluconate Cloth 2 % PADS 6 each, 6 each, Topical, Q0600, Elwyn Reach, MD, 6 each at 11/05/20 0548 .  enoxaparin (LOVENOX) injection 40 mg, 40 mg, Subcutaneous, Q24H, Domenic Polite, MD, 40 mg at 11/05/20 1013 .  furosemide (LASIX) injection 80 mg, 80 mg, Intravenous, BID, Domenic Polite, MD, 80 mg at 11/05/20 1012 .  insulin aspart (novoLOG) injection 0-9 Units, 0-9 Units, Subcutaneous, TID WC, Domenic Polite, MD, 5 Units at 11/05/20 1013 .  insulin aspart protamine- aspart (NOVOLOG MIX 70/30) injection 15 Units, 15 Units, Subcutaneous, BID WC, Domenic Polite, MD, 15 Units at 11/05/20 1013 .  ipratropium-albuterol (DUONEB) 0.5-2.5 (3) MG/3ML nebulizer solution 3 mL, 3 mL, Nebulization, QID, Domenic Polite, MD, 3 mL at 11/05/20  0836 .  ipratropium-albuterol (DUONEB) 0.5-2.5 (3) MG/3ML nebulizer solution 3 mL, 3 mL, Nebulization, Q2H PRN, Domenic Polite, MD .  lidocaine (LIDODERM) 5 % 1 patch, 1 patch, Transdermal, Q24H, Priscella Mann, Sudheer B, MD, 1 patch at 11/13/2020 0824 .  magnesium hydroxide (MILK OF MAGNESIA) suspension 30 mL, 30 mL, Oral, QHS PRN, Jonelle Sidle, Mohammad L, MD .  MEDLINE mouth rinse, 15 mL, Mouth Rinse, q12n4p, Garba, Mohammad L, MD, 15 mL at 11/04/20 1747 .  methocarbamol (ROBAXIN) tablet 500 mg, 500 mg, Oral, Q8H PRN,  Ralene Muskrat B, MD, 500 mg at 11/05/2020 [id number] .  methylPREDNISolone sodium succinate (SOLU-MEDROL) 125 mg/2 mL injection 60 mg, 60 mg, Intravenous, Q12H, Domenic Polite, MD, 60 mg at 11/05/20 1014 .  multivitamin with minerals tablet 1 tablet, 1 tablet, Oral, QODAY, Elwyn Reach, MD, 1 tablet at 11/05/20 1012 .  mupirocin ointment (BACTROBAN) 2 %, , Topical, BID, Elwyn Reach, MD, Given at 11/05/20 1014 .  ondansetron (ZOFRAN) injection 4 mg, 4 mg, Intravenous, Q6H PRN, Garba, Mohammad L, MD .  pantoprazole (PROTONIX) EC tablet 40 mg, 40 mg, Oral, Daily, Jonelle Sidle, Mohammad L, MD, 40 mg at 11/05/20 1012 .  simethicone (MYLICON) chewable tablet 80 mg, 80 mg, Oral, QID PRN, Ralene Muskrat B, MD, 80 mg at 10/31/20 1744 .  simvastatin (ZOCOR) tablet 20 mg, 20 mg, Oral, q1600, Jonelle Sidle, Mohammad L, MD, 20 mg at 11/04/20 1734 .  sodium chloride (OCEAN) 0.65 % nasal spray 1 spray, 1 spray, Each Nare, PRN, Domenic Polite, MD .  sodium chloride flush (NS) 0.9 % injection 3 mL, 3 mL, Intravenous, Q12H, Fath, Javier Docker, MD, 3 mL at 11/05/20 1014 .  sodium chloride flush (NS) 0.9 % injection 3 mL, 3 mL, Intravenous, PRN, Teodoro Spray, MD .  triamcinolone cream (KENALOG) 0.1 % cream, , Topical, BID, Elwyn Reach, MD, Given at 11/05/20 1014    ALLERGIES   Aspirin, Celebrex [celecoxib], Meloxicam, Nsaids, Quetiapine, Rofecoxib, Sulfa antibiotics, Tolmetin, Bactrim  [sulfamethoxazole-trimethoprim], Biguanide copolymer, Buprenorphine hcl, Cetirizine, Ciprofloxacin, Codeine, Dextromethorphan hbr, Dextromethorphan polistirex er, Furosemide, Hydrocodone, Metformin, Morphine and related, Penicillins, Pregabalin, Promethazine, Propoxyphene, Propoxyphene, Quinolones, Seroquel [quetiapine fumarate], Serotonin reuptake inhibitors (ssris), Tramadol, and Ultracet [tramadol-acetaminophen]     REVIEW OF SYSTEMS    Review of Systems:  Gen:  Denies  fever, sweats, chills weigh loss  HEENT: Denies blurred vision, double vision, ear pain, eye pain, hearing loss, nose bleeds, sore throat Cardiac:  No dizziness, chest pain or heaviness, chest tightness,edema Resp:   Denies cough or sputum porduction, shortness of breath,wheezing, hemoptysis,  Gi: Denies swallowing difficulty, stomach pain, nausea or vomiting, diarrhea, constipation, bowel incontinence Gu:  Denies bladder incontinence, burning urine Ext:   Denies Joint pain, stiffness or swelling Skin: Denies  skin rash, easy bruising or bleeding or hives Endoc:  Denies polyuria, polydipsia , polyphagia or weight change Psych:   Denies depression, insomnia or hallucinations   Other:  All other systems negative   VS: BP 113/69 (BP Location: Right Wrist)   Pulse 65   Temp 98.1 F (36.7 C)   Resp 19   Ht 4\' 11"  (1.499 m)   Wt 122.8 kg   SpO2 98%   BMI 54.68 kg/m      PHYSICAL EXAM    GENERAL:NAD, no fevers, chills, no weakness no fatigue HEAD: Normocephalic, atraumatic.  EYES: Pupils equal, round, reactive to light. Extraocular muscles intact. No scleral icterus.  MOUTH: Moist mucosal membrane. Dentition intact. No abscess noted.  EAR, NOSE, THROAT: Clear without exudates. No external lesions.  NECK: Supple. No thyromegaly. No nodules. No JVD.  PULMONARY: rhonchi bilaterally.  CARDIOVASCULAR: S1 and S2. Regular rate and rhythm. No murmurs, rubs, or gallops. No edema. Pedal pulses 2+ bilaterally.   GASTROINTESTINAL: Soft, nontender, nondistended. No masses. Positive bowel sounds. No hepatosplenomegaly.  MUSCULOSKELETAL: No swelling, clubbing, or edema. Range of motion full in all extremities.  NEUROLOGIC: Cranial nerves II through XII are intact. No gross focal neurological deficits. Sensation intact. Reflexes intact.  SKIN: No ulceration, lesions, rashes, or  cyanosis. Skin warm and dry. Turgor intact.  PSYCHIATRIC: Mood, affect within normal limits. The patient is awake, alert and oriented x 3. Insight, judgment intact.       IMAGING    DG Chest 2 View  Result Date: 11/04/2020 CLINICAL DATA:  Shortness of breath EXAM: CHEST - 2 VIEW COMPARISON:  December 07, 2018. FINDINGS: There is no edema or airspace opacity. There is slight atelectasis in the left mid lung. Heart is upper normal in size with pacemaker leads attached to the right atrium and right ventricle. No adenopathy. There is aortic atherosclerosis. There is degenerative change in the thoracic spine. IMPRESSION: Slight left midlung atelectasis. No edema or airspace opacity. Heart upper normal in size with pacemaker leads attached to right atrium and right ventricle. Aortic Atherosclerosis (ICD10-I70.0). Electronically Signed   By: Lowella Grip III M.D.   On: 11/05/2020 17:08   CT CHEST WO CONTRAST  Result Date: 11/04/2020 CLINICAL DATA:  Respiratory failure, worsening hypoxia, CHF, concern for infectious process EXAM: CT CHEST WITHOUT CONTRAST TECHNIQUE: Multidetector CT imaging of the chest was performed following the standard protocol without IV contrast. COMPARISON:  10/28/2020 FINDINGS: Cardiovascular: Aortic atherosclerosis. Right chest multi lead pacer. Cardiomegaly. Left and right coronary artery calcifications. No pericardial effusion. Mediastinum/Nodes: No enlarged mediastinal, hilar, or axillary lymph nodes. Thyroid gland, trachea, and esophagus demonstrate no significant findings. Lungs/Pleura: Small to moderate  bilateral pleural effusions and associated atelectasis or consolidation, slightly increased compared to prior examination. Very extensive heterogeneous and ground-glass airspace opacity throughout the lungs, substantially increased compared to prior examination. Mild interlobular septal thickening. Upper Abdomen: No acute abnormality. Musculoskeletal: No chest wall mass or suspicious bone lesions identified. IMPRESSION: 1. Small to moderate bilateral pleural effusions and associated atelectasis or consolidation, slightly increased compared to prior examination. 2. Very extensive heterogeneous and ground-glass airspace opacity throughout the lungs, substantially increased compared to prior examination. Mild interlobular septal thickening. Findings are consistent with worsened multifocal infection, edema, and/or ARDS. 3. Cardiomegaly and coronary artery disease. Aortic Atherosclerosis (ICD10-I70.0). Electronically Signed   By: Eddie Candle M.D.   On: 11/04/2020 16:32   CT ANGIO CHEST PE W OR WO CONTRAST  Result Date: 10/28/2020 CLINICAL DATA:  87 year old female with concern for pulmonary embolism. EXAM: CT ANGIOGRAPHY CHEST WITH CONTRAST TECHNIQUE: Multidetector CT imaging of the chest was performed using the standard protocol during bolus administration of intravenous contrast. Multiplanar CT image reconstructions and MIPs were obtained to evaluate the vascular anatomy. CONTRAST:  67mL OMNIPAQUE IOHEXOL 350 MG/ML SOLN COMPARISON:  Chest CT dated 02/07/2016. FINDINGS: Cardiovascular: Mild cardiomegaly. No pericardial effusion. There is coronary vascular calcification and calcification of the mitral annulus. Right pectoral pacemaker device. Advanced atherosclerotic calcification of the thoracic aorta. No aneurysmal dilatation. Evaluation of the pulmonary arteries is limited due to respiratory motion artifact. No large or central pulmonary artery embolus identified. Mediastinum/Nodes: No hilar or mediastinal  adenopathy. The esophagus is grossly unremarkable. No mediastinal fluid collection. Lungs/Pleura: There are small bilateral pleural effusions with bibasilar atelectasis. Diffuse interstitial and interlobular septal prominence and patchy bilateral ground-glass opacities consistent with vascular congestion and edema. Scattered ground-glass nodularity may represent edema or pneumonia. Clinical correlation is recommended. No pneumothorax. The central airways are patent. Upper Abdomen: There is a 15 mm hypodense lesion in the right lobe of the liver. Cholecystectomy. Indeterminate subcentimeter left renal hypodense lesion. Musculoskeletal: Osteopenia with scoliosis and degenerative changes of the spine. No acute osseous pathology. Review of the MIP images confirms the above findings. IMPRESSION: 1. No  CT evidence of central pulmonary artery embolus. 2. Mild cardiomegaly with findings of CHF and small bilateral pleural effusions. Scattered ground-glass nodularity may represent edema or pneumonia. Clinical correlation is recommended. 3. Aortic Atherosclerosis (ICD10-I70.0). Electronically Signed   By: Anner Crete M.D.   On: 10/28/2020 01:14   DG Chest Port 1 View  Result Date: 11/03/2020 CLINICAL DATA:  Hypoxia. EXAM: PORTABLE CHEST 1 VIEW COMPARISON:  Radiographs 11/11/2020 and 11/12/2020.  CT 10/28/2020. FINDINGS: 0934 hours. Right subclavian pacemaker leads appear unchanged over the right atrium and right ventricle. There is stable mild cardiomegaly and aortic atherosclerosis. There are progressive nodular airspace opacities in both lungs with small bilateral pleural effusions. No evidence of pneumothorax. The bones appear unchanged. IMPRESSION: Progressive nodular airspace opacities in both lungs suspicious for multifocal pneumonia, possibly viral in etiology. There may be a component of pulmonary edema as well, and small bilateral pleural effusions are present. Electronically Signed   By: Richardean Sale M.D.    On: 11/03/2020 10:04   DG Chest Port 1 View  Result Date: 10/26/2020 CLINICAL DATA:  87 year old female with wheezing. EXAM: PORTABLE CHEST 1 VIEW COMPARISON:  Chest radiograph dated 11/11/2020. FINDINGS: There is cardiomegaly with vascular congestion and edema. Pneumonia is not excluded clinical correlation recommended. Small bilateral pleural effusion. No pneumothorax. Atherosclerotic calcification of the aorta. Right pectoral pacemaker device. No acute osseous pathology. IMPRESSION: Cardiomegaly with findings of CHF. Pneumonia is not excluded. Electronically Signed   By: Anner Crete M.D.   On: 10/26/2020 02:26   ECHOCARDIOGRAM COMPLETE  Result Date: 11/16/2020    ECHOCARDIOGRAM REPORT   Patient Name:   IDANIA OGAZ [facility] Date of Exam: 11/10/2020 Medical Rec #:  [id number]        Height:       59.0 in Accession #:    [id number]       Weight:       275.8 lb Date of Birth:  [birth year]        BSA:          2.114 m Patient Age:    57 years         BP:           128/75 mmHg Patient Gender: F                HR:           78 bpm. Exam Location:  [facility] Procedure: 2D Echo, Color Doppler and Cardiac Doppler Indications:     CHF- acute diastolic [id number]  History:         Patient has prior history of Echocardiogram examinations, most                  recent 03/17/2015. Pacemaker, COPD; Risk Factors:Hypertension                  and Diabetes.  Sonographer:     Sherrie Sport RDCS (AE) Referring Phys:  [place] Diagnosing Phys: Isaias Cowman MD IMPRESSIONS  1. Left ventricular ejection fraction, by estimation, is 55 to 60%. The left ventricle has normal function. The left ventricle has no regional wall motion abnormalities. Left ventricular diastolic parameters were normal.  2. Right ventricular systolic function is normal. The right ventricular size is normal.  3. The mitral valve is normal in structure. Mild to moderate mitral valve regurgitation. No evidence of mitral stenosis.  4. The aortic valve is  normal in structure. Aortic valve regurgitation is moderate. No aortic stenosis  is present.  5. The inferior vena cava is normal in size with greater than 50% respiratory variability, suggesting right atrial pressure of 3 mmHg. FINDINGS  Left Ventricle: Left ventricular ejection fraction, by estimation, is 55 to 60%. The left ventricle has normal function. The left ventricle has no regional wall motion abnormalities. The left ventricular internal cavity size was normal in size. There is  no left ventricular hypertrophy. Left ventricular diastolic parameters were normal. Right Ventricle: The right ventricular size is normal. No increase in right ventricular wall thickness. Right ventricular systolic function is normal. Left Atrium: Left atrial size was normal in size. Right Atrium: Right atrial size was normal in size. Pericardium: There is no evidence of pericardial effusion. Mitral Valve: The mitral valve is normal in structure. Mild to moderate mitral valve regurgitation. No evidence of mitral valve stenosis. Tricuspid Valve: The tricuspid valve is normal in structure. Tricuspid valve regurgitation is mild . No evidence of tricuspid stenosis. Aortic Valve: The aortic valve is normal in structure. Aortic valve regurgitation is moderate. No aortic stenosis is present. Aortic valve mean gradient measures 15.7 mmHg. Aortic valve peak gradient measures 27.2 mmHg. Aortic valve area, by VTI measures  0.86 cm. Pulmonic Valve: The pulmonic valve was normal in structure. Pulmonic valve regurgitation is not visualized. No evidence of pulmonic stenosis. Aorta: The aortic root is normal in size and structure. Venous: The inferior vena cava is normal in size with greater than 50% respiratory variability, suggesting right atrial pressure of 3 mmHg. IAS/Shunts: No atrial level shunt detected by color flow Doppler.  LEFT VENTRICLE PLAX 2D LVIDd:         5.12 cm  Diastology LVIDs:         3.55 cm  LV e' medial:    3.59 cm/s LV PW:          1.40 cm  LV E/e' medial:  41.5 LV IVS:        0.99 cm  LV e' lateral:   5.87 cm/s LVOT diam:     2.00 cm  LV E/e' lateral: 25.4 LV SV:         52 LV SV Index:   25 LVOT Area:     3.14 cm  RIGHT VENTRICLE RV Basal diam:  4.07 cm RV S prime:     14.10 cm/s TAPSE (M-mode): 4.6 cm LEFT ATRIUM             Index       RIGHT ATRIUM           Index LA diam:        4.60 cm 2.18 cm/m  RA Area:     21.00 cm LA Vol (A2C):   89.2 ml 42.19 ml/m RA Volume:   60.40 ml  28.57 ml/m LA Vol (A4C):   78.7 ml 37.22 ml/m LA Biplane Vol: 83.9 ml 39.68 ml/m  AORTIC VALVE                    PULMONIC VALVE AV Area (Vmax):    0.94 cm     PV Vmax:        0.89 m/s AV Area (Vmean):   0.96 cm     PV Peak grad:   3.1 mmHg AV Area (VTI):     0.86 cm     RVOT Peak grad: 3 mmHg AV Vmax:           261.00 cm/s AV Vmean:  184.333 cm/s AV VTI:            0.606 m AV Peak Grad:      27.2 mmHg AV Mean Grad:      15.7 mmHg LVOT Vmax:         77.70 cm/s LVOT Vmean:        56.100 cm/s LVOT VTI:          0.166 m LVOT/AV VTI ratio: 0.27  AORTA Ao Root diam: 2.70 cm MITRAL VALVE                TRICUSPID VALVE MV Area (PHT): 5.58 cm     TR Peak grad:   40.7 mmHg MV Decel Time: 136 msec     TR Vmax:        319.00 cm/s MV E velocity: 149.00 cm/s MV A velocity: 127.00 cm/s  SHUNTS MV E/A ratio:  1.17         Systemic VTI:  0.17 m                             Systemic Diam: 2.00 cm Isaias Cowman MD Electronically signed by Isaias Cowman MD Signature Date/Time: 11/13/2020/1:07:34 PM    Final       ASSESSMENT/PLAN   Acute on chronic hypoxemic respiratory failure Multifactorial  - non massive hemoptysis, OSA/OHS, CHF, atelectasis , ckd with 3rd spaced fluid /anasarca, and protein calorie malnutrition -I dont see signs of infection at this time, will stop antibiotics   Non-massive hemoptysis  - stopped eliquis   -supportive care    -will consider nebulzed tXA if necessary   Severe bibasilar atelectasis  - IS at  bedside  - BID MEtaneb with saline   Bilateral pleural effusions worse on right  - continue diuresis - agree with lasix 80 bid   - decrease infusion of any volume - stopped maxipime   COPD - chornic  - due to AF - stop duonep will start xopenex only   - no need to continue pulmicort  Severe decondintiong   - aggressive PT/OT - contributing to atelectasis and respiratory failure   Moderate OSA on CPAP   - on BIPAP now   - there is skin breakdown over nasal bridge - ordered Gecko gelpad QHS interface.     Chronic atrial fibrillation  - patient is on eluqis which is being held for now, cardiology is on case.  Non-massive hemoptysis is resolving.    Chronic kidney disease Dc nephrotoxins -continue diuresis    Thank you for allowing me to participate in the care of this patient.  Total face to face encounter time for this patient visit was 40 min. >50% of the time was  spent in counseling and coordination of care.   Patient/Family are satisfied with care plan and all questions have been answered.  This document was prepared using Dragon voice recognition software and may include unintentional dictation errors.     Ottie Glazier, M.D.  Division of [place]

## 2020-11-06 ENCOUNTER — Ambulatory Visit: Payer: Medicare Other | Admitting: Family

## 2020-11-06 DIAGNOSIS — J9621 Acute and chronic respiratory failure with hypoxia: Secondary | ICD-10-CM | POA: Diagnosis not present

## 2020-11-06 LAB — BASIC METABOLIC PANEL
Anion gap: 14 (ref 5–15)
BUN: 110 mg/dL — ABNORMAL HIGH (ref 8–23)
CO2: 25 mmol/L (ref 22–32)
Calcium: 9.1 mg/dL (ref 8.9–10.3)
Chloride: 99 mmol/L (ref 98–111)
Creatinine, Ser: 2.02 mg/dL — ABNORMAL HIGH (ref 0.44–1.00)
GFR, Estimated: 24 mL/min — ABNORMAL LOW (ref >60)
Glucose, Bld: 317 mg/dL — ABNORMAL HIGH (ref 70–99)
Potassium: 4.3 mmol/L (ref 3.5–5.1)
Sodium: 138 mmol/L (ref 135–145)

## 2020-11-06 LAB — CBC WITH DIFFERENTIAL/PLATELET
Abs Immature Granulocytes: 0.03 10*3/uL (ref 0.00–0.07)
Basophils Absolute: 0 10*3/uL (ref 0.0–0.1)
Basophils Relative: 0 %
Eosinophils Absolute: 0 10*3/uL (ref 0.0–0.5)
Eosinophils Relative: 0 %
HCT: 27.6 % — ABNORMAL LOW (ref 36.0–46.0)
Hemoglobin: 8.7 g/dL — ABNORMAL LOW (ref 12.0–15.0)
Immature Granulocytes: 0 %
Lymphocytes Relative: 3 %
Lymphs Abs: 0.3 10*3/uL — ABNORMAL LOW (ref 0.7–4.0)
MCH: 27.4 pg (ref 26.0–34.0)
MCHC: 31.5 g/dL (ref 30.0–36.0)
MCV: 87.1 fL (ref 80.0–100.0)
Monocytes Absolute: 0.8 10*3/uL (ref 0.1–1.0)
Monocytes Relative: 9 %
Neutro Abs: 7.7 10*3/uL (ref 1.7–7.7)
Neutrophils Relative %: 88 %
Platelets: 184 10*3/uL (ref 150–400)
RBC: 3.17 MIL/uL — ABNORMAL LOW (ref 3.87–5.11)
RDW: 13.4 % (ref 11.5–15.5)
WBC: 8.8 10*3/uL (ref 4.0–10.5)
nRBC: 0.2 % (ref 0.0–0.2)

## 2020-11-06 LAB — GLUCOSE, CAPILLARY
Glucose-Capillary: 274 mg/dL — ABNORMAL HIGH (ref 70–99)
Glucose-Capillary: 208 mg/dL — ABNORMAL HIGH (ref 70–99)
Glucose-Capillary: 133 mg/dL — ABNORMAL HIGH (ref 70–99)
Glucose-Capillary: 239 mg/dL — ABNORMAL HIGH (ref 70–99)

## 2020-11-06 LAB — PROCALCITONIN: Procalcitonin: 0.25 ng/mL

## 2020-11-06 MED ORDER — FUROSEMIDE 10 MG/ML IJ SOLN
40.0000 mg | Freq: Two times a day (BID) | INTRAMUSCULAR | Status: DC
  Administered 2020-11-06 – 2020-11-10 (×9): 40 mg via INTRAVENOUS
  Filled 2020-11-06 (×8): qty 4

## 2020-11-06 MED ORDER — SPIRONOLACTONE 25 MG PO TABS
50.0000 mg | ORAL_TABLET | Freq: Every day | ORAL | Status: DC
  Administered 2020-11-06 – 2020-11-12 (×7): 50 mg via ORAL
  Filled 2020-11-06 (×8): qty 2

## 2020-11-06 NOTE — Progress Notes (Signed)
Pulmonary Medicine          Date: 11/06/2020,   MRN# [id number] Teresa Krueger 1934/04/11     AdmissionWeight: 117 kg                 CurrentWeight: 122.8 kg   Referring physician: Dr Broadus John   CHIEF COMPLAINT:   Acute on chronic hypoxemic respiratory failure   HISTORY OF PRESENT ILLNESS   Teresa Krueger is a 87 y.o. female with medical history significant of coronary artery disease, chronic lymphedema, COPD, chronic kidney disease stage III, hyperlipidemia, hypertension, diabetes and morbid obesity who was being worked up for angina at home.  Patient was supposed to have a cardiac stress test this week.  She started having chest pressure today shortness of breath.  The shortness of breath has been progressive over the last week.  Currently present even at rest without exertion.  She has pain that is been going on in the left side going to the neck and the back.  It is more pressure that is worse with exertion.  Patient also has noted progressive worsening of leg swelling.  She has cough and frothy sputum.  No fever or chills.  No sick contact.  Patient uses oxygen only at night at about 2 L.  Patient also recently was set up with a Holter monitor which she completed but the results are still pending.  She came to the ER where she was found to be hypoxic evidence of fluid overload as well as anxiety.  Patient progressively got worse in the ER and now is on BiPAP.  She is hypoxic and appears to have evidence of anasarca.  She has been admitted to the hospital for angina with acute on chronic respiratory failure with hypoxia, temperature 98 blood pressure 153/55, pulse 94 respirate of 29 oxygen sat 92% on room air.  Currently 96% on BiPAP.  Hemoglobin is 9.4.  Potassium 5.2.  BUN 63 and creatinine 1.40.  Glucose is 210.  BNP is 593.  Initial troponin 24-second troponin 25.  COVID-19 screen is currently pending.  Chest x-ray showed slight left midlung atelectasis.  No edema or  airspace opacity. Despite several days of diuresis her respiratory status is not improved and she continues to have hemoptysis with consult placed for PCCM for additional evaluation from pulmonary perspective.   -11/06/20- Vitals improved, weaning O2.  Patient diuresed >4L thus far.  Reviewed blood work, CBC is reassuring without concerning findings, metabolic profile reviewed with significant hyperglycemia, acute on chronic renal insufficiency in the context of anasarca with CHF and CKD/OHS.  Optimizing diuretic continuing Lasix will change to 40 twice daily adding Aldactone 50 daily.  Blood pressure is good with mild hypertension which will be useful while diuresing.  We will need to concentrate on decreasing additional nephrotoxins while diuresing.  Also will perform US liver to investigate steatohepatitis as additional etiology of recurrent fluid retention with anasarca.   PAST MEDICAL HISTORY   Past Medical History:  Diagnosis Date  . Arthritis   . Asthma   . Barrett's esophagus   . Breast cancer ([place]) 1999   LT MASTECTOMY  . Bronchiectasis ([place])   . COPD (chronic obstructive pulmonary disease) ([place])   . Diabetes mellitus ([place])   . Hypercholesterolemia   . Hypertension   . Lymphedema   . Mini stroke ([place])   . Osteoporosis   . Pacemaker      SURGICAL HISTORY   Past Surgical History:  Procedure Laterality Date  . CARPAL TUNNEL RELEASE     right  . CATARACT EXTRACTION     bilateral  . GALLBLADDER SURGERY    . MASTECTOMY Left 1999   BREAST CA  . PACEMAKER INSERTION Right 03/21/2015   Procedure: INSERTION PACEMAKER;  Surgeon: Isaias Cowman, MD;  Location: [facility] ORS;  Service: Cardiovascular;  Laterality: Right;  . TOTAL HIP ARTHROPLASTY  2001   left  . TRIGGER FINGER RELEASE     bilateral     FAMILY HISTORY   Family History  Problem Relation Age of Onset  . Heart disease Paternal Grandmother   . Diabetes Paternal Grandmother   . Heart disease Father   . Heart  disease Mother   . Heart disease Maternal Grandmother   . Stroke Maternal Aunt   . Breast cancer Paternal Aunt 94  . Lung cancer Brother 52  . Kidney disease Cousin   . Bladder Cancer Neg Hx      SOCIAL HISTORY   Social History   Tobacco Use  . Smoking status: Former Smoker    Packs/day: 1.00    Years: 30.00    Pack years: 30.00    Types: Cigarettes    Quit date: 12/27/1992    Years since quitting: 27.8  . Smokeless tobacco: Never Used  Substance Use Topics  . Alcohol use: No  . Drug use: No     MEDICATIONS    Home Medication:    Current Medication:  Current Facility-Administered Medications:  .  0.9 %  sodium chloride infusion, 250 mL, Intravenous, PRN, Teodoro Spray, MD .  acetaminophen (TYLENOL) tablet 650 mg, 650 mg, Oral, Q4H PRN, Elwyn Reach, MD, 650 mg at 11/03/20 0945 .  acetaminophen (TYLENOL) tablet 650 mg, 650 mg, Oral, QHS, Domenic Polite, MD, 650 mg at 11/05/20 2232 .  ALPRAZolam Duanne Moron) tablet 0.5 mg, 0.5 mg, Oral, BID, 0.5 mg at 11/05/20 2232 **AND** ALPRAZolam Duanne Moron) tablet 0.5 mg, 0.5 mg, Oral, Daily PRN, Renda Rolls, RPH, 0.5 mg at 11/05/20 1922 .  calcium carbonate (TUMS - dosed in mg elemental calcium) chewable tablet 200 mg of elemental calcium, 1 tablet, Oral, PRN, Jonelle Sidle, Mohammad L, MD, 200 mg of elemental calcium at 11/02/20 0928 .  capsaicin (ZOSTRIX) 0.025 % cream, , Topical, BID, Priscella Mann, Sudheer B, MD, Given at 11/05/20 1012 .  chlorhexidine (PERIDEX) 0.12 % solution 15 mL, 15 mL, Mouth Rinse, BID, Jonelle Sidle, Mohammad L, MD, 15 mL at 11/05/20 2231 .  Chlorhexidine Gluconate Cloth 2 % PADS 6 each, 6 each, Topical, Q0600, Elwyn Reach, MD, 6 each at 11/06/20 0454 .  heparin injection 5,000 Units, 5,000 Units, Subcutaneous, Q12H, Ottie Glazier, MD, 5,000 Units at 11/05/20 2231 .  insulin aspart (novoLOG) injection 0-9 Units, 0-9 Units, Subcutaneous, TID WC, Domenic Polite, MD, 5 Units at 11/05/20 1629 .  insulin aspart  protamine- aspart (NOVOLOG MIX 70/30) injection 18 Units, 18 Units, Subcutaneous, BID WC, Domenic Polite, MD, 18 Units at 11/05/20 1629 .  levalbuterol (XOPENEX) nebulizer solution 0.63 mg, 0.63 mg, Nebulization, Q6H PRN, Lanney Gins, Dartha Rozzell, MD .  lidocaine (LIDODERM) 5 % 1 patch, 1 patch, Transdermal, Q24H, Sreenath, Sudheer B, MD, 1 patch at 10/27/2020 0824 .  magnesium hydroxide (MILK OF MAGNESIA) suspension 30 mL, 30 mL, Oral, QHS PRN, Jonelle Sidle, Mohammad L, MD .  MEDLINE mouth rinse, 15 mL, Mouth Rinse, q12n4p, Garba, Mohammad L, MD, 15 mL at 11/05/20 1623 .  methocarbamol (ROBAXIN) tablet 500 mg, 500 mg, Oral, Q8H PRN,  Ralene Muskrat B, MD, 500 mg at 10/27/2020 [id number] .  methylPREDNISolone sodium succinate (SOLU-MEDROL) 40 mg/mL injection 40 mg, 40 mg, Intravenous, Daily, Jamin Panther, MD .  multivitamin with minerals tablet 1 tablet, 1 tablet, Oral, QODAY, Elwyn Reach, MD, 1 tablet at 11/05/20 1012 .  mupirocin ointment (BACTROBAN) 2 %, , Topical, BID, Elwyn Reach, MD, Given at 11/05/20 2234 .  ondansetron (ZOFRAN) injection 4 mg, 4 mg, Intravenous, Q6H PRN, Garba, Mohammad L, MD .  pantoprazole (PROTONIX) EC tablet 40 mg, 40 mg, Oral, Daily, Jonelle Sidle, Mohammad L, MD, 40 mg at 11/05/20 1012 .  simethicone (MYLICON) chewable tablet 80 mg, 80 mg, Oral, QID PRN, Ralene Muskrat B, MD, 80 mg at 10/31/20 1744 .  simvastatin (ZOCOR) tablet 20 mg, 20 mg, Oral, q1600, Gala Romney L, MD, 20 mg at 11/05/20 1628 .  sodium chloride (OCEAN) 0.65 % nasal spray 1 spray, 1 spray, Each Nare, PRN, Domenic Polite, MD .  sodium chloride flush (NS) 0.9 % injection 3 mL, 3 mL, Intravenous, Q12H, Teodoro Spray, MD, 3 mL at 11/05/20 2241 .  sodium chloride flush (NS) 0.9 % injection 3 mL, 3 mL, Intravenous, PRN, Teodoro Spray, MD .  triamcinolone cream (KENALOG) 0.1 % cream, , Topical, BID, Elwyn Reach, MD, Given at 11/05/20 2234    ALLERGIES   Aspirin, Celebrex [celecoxib], Meloxicam,  Nsaids, Quetiapine, Rofecoxib, Sulfa antibiotics, Tolmetin, Bactrim [sulfamethoxazole-trimethoprim], Biguanide copolymer, Buprenorphine hcl, Cetirizine, Ciprofloxacin, Codeine, Dextromethorphan hbr, Dextromethorphan polistirex er, Furosemide, Hydrocodone, Metformin, Morphine and related, Penicillins, Pregabalin, Promethazine, Propoxyphene, Propoxyphene, Quinolones, Seroquel [quetiapine fumarate], Serotonin reuptake inhibitors (ssris), Tramadol, and Ultracet [tramadol-acetaminophen]     REVIEW OF SYSTEMS    Review of Systems:  Gen:  Denies  fever, sweats, chills weigh loss  HEENT: Denies blurred vision, double vision, ear pain, eye pain, hearing loss, nose bleeds, sore throat Cardiac:  No dizziness, chest pain or heaviness, chest tightness,edema Resp:   Denies cough or sputum porduction, shortness of breath,wheezing, hemoptysis,  Gi: Denies swallowing difficulty, stomach pain, nausea or vomiting, diarrhea, constipation, bowel incontinence Gu:  Denies bladder incontinence, burning urine Ext:   Denies Joint pain, stiffness or swelling Skin: Denies  skin rash, easy bruising or bleeding or hives Endoc:  Denies polyuria, polydipsia , polyphagia or weight change Psych:   Denies depression, insomnia or hallucinations   Other:  All other systems negative   VS: BP (!) 150/60 (BP Location: Right Arm)   Pulse 76   Temp 98.5 F (36.9 C)   Resp 20   Ht 4\' 11"  (1.499 m)   Wt 122.8 kg   SpO2 95%   BMI 54.68 kg/m      PHYSICAL EXAM    GENERAL:NAD, no fevers, chills, no weakness no fatigue HEAD: Normocephalic, atraumatic.  EYES: Pupils equal, round, reactive to light. Extraocular muscles intact. No scleral icterus.  MOUTH: Moist mucosal membrane. Dentition intact. No abscess noted.  EAR, NOSE, THROAT: Clear without exudates. No external lesions.  NECK: Supple. No thyromegaly. No nodules. No JVD.  PULMONARY: rhonchi bilaterally.  CARDIOVASCULAR: S1 and S2. Regular rate and rhythm. No  murmurs, rubs, or gallops. No edema. Pedal pulses 2+ bilaterally.  GASTROINTESTINAL: Soft, nontender, nondistended. No masses. Positive bowel sounds. No hepatosplenomegaly.  MUSCULOSKELETAL: No swelling, clubbing, or edema. Range of motion full in all extremities.  NEUROLOGIC: Cranial nerves II through XII are intact. No gross focal neurological deficits. Sensation intact. Reflexes intact.  SKIN: No ulceration, lesions, rashes, or cyanosis. Skin warm and dry.  Turgor intact.  PSYCHIATRIC: Mood, affect within normal limits. The patient is awake, alert and oriented x 3. Insight, judgment intact.       IMAGING    DG Chest 2 View  Result Date: 10/28/2020 CLINICAL DATA:  Shortness of breath EXAM: CHEST - 2 VIEW COMPARISON:  December 07, 2018. FINDINGS: There is no edema or airspace opacity. There is slight atelectasis in the left mid lung. Heart is upper normal in size with pacemaker leads attached to the right atrium and right ventricle. No adenopathy. There is aortic atherosclerosis. There is degenerative change in the thoracic spine. IMPRESSION: Slight left midlung atelectasis. No edema or airspace opacity. Heart upper normal in size with pacemaker leads attached to right atrium and right ventricle. Aortic Atherosclerosis (ICD10-I70.0). Electronically Signed   By: Lowella Grip III M.D.   On: 11/03/2020 17:08   CT CHEST WO CONTRAST  Result Date: 11/04/2020 CLINICAL DATA:  Respiratory failure, worsening hypoxia, CHF, concern for infectious process EXAM: CT CHEST WITHOUT CONTRAST TECHNIQUE: Multidetector CT imaging of the chest was performed following the standard protocol without IV contrast. COMPARISON:  10/28/2020 FINDINGS: Cardiovascular: Aortic atherosclerosis. Right chest multi lead pacer. Cardiomegaly. Left and right coronary artery calcifications. No pericardial effusion. Mediastinum/Nodes: No enlarged mediastinal, hilar, or axillary lymph nodes. Thyroid gland, trachea, and esophagus demonstrate  no significant findings. Lungs/Pleura: Small to moderate bilateral pleural effusions and associated atelectasis or consolidation, slightly increased compared to prior examination. Very extensive heterogeneous and ground-glass airspace opacity throughout the lungs, substantially increased compared to prior examination. Mild interlobular septal thickening. Upper Abdomen: No acute abnormality. Musculoskeletal: No chest wall mass or suspicious bone lesions identified. IMPRESSION: 1. Small to moderate bilateral pleural effusions and associated atelectasis or consolidation, slightly increased compared to prior examination. 2. Very extensive heterogeneous and ground-glass airspace opacity throughout the lungs, substantially increased compared to prior examination. Mild interlobular septal thickening. Findings are consistent with worsened multifocal infection, edema, and/or ARDS. 3. Cardiomegaly and coronary artery disease. Aortic Atherosclerosis (ICD10-I70.0). Electronically Signed   By: Eddie Candle M.D.   On: 11/04/2020 16:32   CT ANGIO CHEST PE W OR WO CONTRAST  Result Date: 10/28/2020 CLINICAL DATA:  87 year old female with concern for pulmonary embolism. EXAM: CT ANGIOGRAPHY CHEST WITH CONTRAST TECHNIQUE: Multidetector CT imaging of the chest was performed using the standard protocol during bolus administration of intravenous contrast. Multiplanar CT image reconstructions and MIPs were obtained to evaluate the vascular anatomy. CONTRAST:  30mL OMNIPAQUE IOHEXOL 350 MG/ML SOLN COMPARISON:  Chest CT dated 02/07/2016. FINDINGS: Cardiovascular: Mild cardiomegaly. No pericardial effusion. There is coronary vascular calcification and calcification of the mitral annulus. Right pectoral pacemaker device. Advanced atherosclerotic calcification of the thoracic aorta. No aneurysmal dilatation. Evaluation of the pulmonary arteries is limited due to respiratory motion artifact. No large or central pulmonary artery embolus  identified. Mediastinum/Nodes: No hilar or mediastinal adenopathy. The esophagus is grossly unremarkable. No mediastinal fluid collection. Lungs/Pleura: There are small bilateral pleural effusions with bibasilar atelectasis. Diffuse interstitial and interlobular septal prominence and patchy bilateral ground-glass opacities consistent with vascular congestion and edema. Scattered ground-glass nodularity may represent edema or pneumonia. Clinical correlation is recommended. No pneumothorax. The central airways are patent. Upper Abdomen: There is a 15 mm hypodense lesion in the right lobe of the liver. Cholecystectomy. Indeterminate subcentimeter left renal hypodense lesion. Musculoskeletal: Osteopenia with scoliosis and degenerative changes of the spine. No acute osseous pathology. Review of the MIP images confirms the above findings. IMPRESSION: 1. No CT evidence of central pulmonary  artery embolus. 2. Mild cardiomegaly with findings of CHF and small bilateral pleural effusions. Scattered ground-glass nodularity may represent edema or pneumonia. Clinical correlation is recommended. 3. Aortic Atherosclerosis (ICD10-I70.0). Electronically Signed   By: Anner Crete M.D.   On: 10/28/2020 01:14   DG Chest Port 1 View  Result Date: 11/03/2020 CLINICAL DATA:  Hypoxia. EXAM: PORTABLE CHEST 1 VIEW COMPARISON:  Radiographs 10/28/2020 and 2020-11-24.  CT 10/28/2020. FINDINGS: 0934 hours. Right subclavian pacemaker leads appear unchanged over the right atrium and right ventricle. There is stable mild cardiomegaly and aortic atherosclerosis. There are progressive nodular airspace opacities in both lungs with small bilateral pleural effusions. No evidence of pneumothorax. The bones appear unchanged. IMPRESSION: Progressive nodular airspace opacities in both lungs suspicious for multifocal pneumonia, possibly viral in etiology. There may be a component of pulmonary edema as well, and small bilateral pleural effusions are  present. Electronically Signed   By: Richardean Sale M.D.   On: 11/03/2020 10:04   DG Chest Port 1 View  Result Date: 10/31/2020 CLINICAL DATA:  87 year old female with wheezing. EXAM: PORTABLE CHEST 1 VIEW COMPARISON:  Chest radiograph dated November 24, 2020. FINDINGS: There is cardiomegaly with vascular congestion and edema. Pneumonia is not excluded clinical correlation recommended. Small bilateral pleural effusion. No pneumothorax. Atherosclerotic calcification of the aorta. Right pectoral pacemaker device. No acute osseous pathology. IMPRESSION: Cardiomegaly with findings of CHF. Pneumonia is not excluded. Electronically Signed   By: Anner Crete M.D.   On: 10/28/2020 02:26   ECHOCARDIOGRAM COMPLETE  Result Date: 11/03/2020    ECHOCARDIOGRAM REPORT   Patient Name:   Teresa Krueger [facility] Date of Exam: 11/05/2020 Medical Rec #:  [id number]        Height:       59.0 in Accession #:    [id number]       Weight:       275.8 lb Date of Birth:  1933/09/11        BSA:          2.114 m Patient Age:    32 years         BP:           128/75 mmHg Patient Gender: F                HR:           78 bpm. Exam Location:  [facility] Procedure: 2D Echo, Color Doppler and Cardiac Doppler Indications:     CHF- acute diastolic K44.01  History:         Patient has prior history of Echocardiogram examinations, most                  recent 03/17/2015. Pacemaker, COPD; Risk Factors:Hypertension                  and Diabetes.  Sonographer:     Sherrie Sport RDCS (AE) Referring Phys:  [place] Diagnosing Phys: Isaias Cowman MD IMPRESSIONS  1. Left ventricular ejection fraction, by estimation, is 55 to 60%. The left ventricle has normal function. The left ventricle has no regional wall motion abnormalities. Left ventricular diastolic parameters were normal.  2. Right ventricular systolic function is normal. The right ventricular size is normal.  3. The mitral valve is normal in structure. Mild to moderate mitral valve  regurgitation. No evidence of mitral stenosis.  4. The aortic valve is normal in structure. Aortic valve regurgitation is moderate. No aortic stenosis is present.  5. The  inferior vena cava is normal in size with greater than 50% respiratory variability, suggesting right atrial pressure of 3 mmHg. FINDINGS  Left Ventricle: Left ventricular ejection fraction, by estimation, is 55 to 60%. The left ventricle has normal function. The left ventricle has no regional wall motion abnormalities. The left ventricular internal cavity size was normal in size. There is  no left ventricular hypertrophy. Left ventricular diastolic parameters were normal. Right Ventricle: The right ventricular size is normal. No increase in right ventricular wall thickness. Right ventricular systolic function is normal. Left Atrium: Left atrial size was normal in size. Right Atrium: Right atrial size was normal in size. Pericardium: There is no evidence of pericardial effusion. Mitral Valve: The mitral valve is normal in structure. Mild to moderate mitral valve regurgitation. No evidence of mitral valve stenosis. Tricuspid Valve: The tricuspid valve is normal in structure. Tricuspid valve regurgitation is mild . No evidence of tricuspid stenosis. Aortic Valve: The aortic valve is normal in structure. Aortic valve regurgitation is moderate. No aortic stenosis is present. Aortic valve mean gradient measures 15.7 mmHg. Aortic valve peak gradient measures 27.2 mmHg. Aortic valve area, by VTI measures  0.86 cm. Pulmonic Valve: The pulmonic valve was normal in structure. Pulmonic valve regurgitation is not visualized. No evidence of pulmonic stenosis. Aorta: The aortic root is normal in size and structure. Venous: The inferior vena cava is normal in size with greater than 50% respiratory variability, suggesting right atrial pressure of 3 mmHg. IAS/Shunts: No atrial level shunt detected by color flow Doppler.  LEFT VENTRICLE PLAX 2D LVIDd:         5.12  cm  Diastology LVIDs:         3.55 cm  LV e' medial:    3.59 cm/s LV PW:         1.40 cm  LV E/e' medial:  41.5 LV IVS:        0.99 cm  LV e' lateral:   5.87 cm/s LVOT diam:     2.00 cm  LV E/e' lateral: 25.4 LV SV:         52 LV SV Index:   25 LVOT Area:     3.14 cm  RIGHT VENTRICLE RV Basal diam:  4.07 cm RV S prime:     14.10 cm/s TAPSE (M-mode): 4.6 cm LEFT ATRIUM             Index       RIGHT ATRIUM           Index LA diam:        4.60 cm 2.18 cm/m  RA Area:     21.00 cm LA Vol (A2C):   89.2 ml 42.19 ml/m RA Volume:   60.40 ml  28.57 ml/m LA Vol (A4C):   78.7 ml 37.22 ml/m LA Biplane Vol: 83.9 ml 39.68 ml/m  AORTIC VALVE                    PULMONIC VALVE AV Area (Vmax):    0.94 cm     PV Vmax:        0.89 m/s AV Area (Vmean):   0.96 cm     PV Peak grad:   3.1 mmHg AV Area (VTI):     0.86 cm     RVOT Peak grad: 3 mmHg AV Vmax:           261.00 cm/s AV Vmean:          184.333 cm/s  AV VTI:            0.606 m AV Peak Grad:      27.2 mmHg AV Mean Grad:      15.7 mmHg LVOT Vmax:         77.70 cm/s LVOT Vmean:        56.100 cm/s LVOT VTI:          0.166 m LVOT/AV VTI ratio: 0.27  AORTA Ao Root diam: 2.70 cm MITRAL VALVE                TRICUSPID VALVE MV Area (PHT): 5.58 cm     TR Peak grad:   40.7 mmHg MV Decel Time: 136 msec     TR Vmax:        319.00 cm/s MV E velocity: 149.00 cm/s MV A velocity: 127.00 cm/s  SHUNTS MV E/A ratio:  1.17         Systemic VTI:  0.17 m                             Systemic Diam: 2.00 cm Isaias Cowman MD Electronically signed by Isaias Cowman MD Signature Date/Time: 11/11/2020/1:07:34 PM    Final       ASSESSMENT/PLAN   Acute on chronic hypoxemic respiratory failure Multifactorial  - non massive hemoptysis, OSA/OHS, CHF, atelectasis , ckd with 3rd spaced fluid /anasarca, and protein calorie malnutrition -I dont see signs of infection at this time, will stop antibiotics    Anasarca   -Most recent transthoracic echo without findings of heart failure  -US  liver rule out cirrhosis secondary to NASH -Continue diuresis adding Aldactone with Lasix 40 twice daily and albumin repletion -Meds nephrotoxins   Non-massive hemoptysis  - stopped eliquis   -supportive care    -will consider nebulzed tXA if necessary   Severe bibasilar atelectasis  - IS at bedside  - BID MEtaneb with saline   Bilateral pleural effusions worse on right  - continue diuresis - agree with lasix 80 bid   - decrease infusion of any volume - stopped maxipime   COPD - chornic  - due to AF - stop duonep will start xopenex only   - no need to continue pulmicort  Severe decondintiong   - aggressive PT/OT - contributing to atelectasis and respiratory failure   Moderate OSA on CPAP   - on BIPAP now   - there is skin breakdown over nasal bridge - ordered Gecko gelpad QHS interface.     Chronic atrial fibrillation  - patient is on eluqis which is being held for now, cardiology is on case.  Non-massive hemoptysis is resolving.    Chronic kidney disease Dc nephrotoxins -continue diuresis    Thank you for allowing me to participate in the care of this patient.  Total face to face encounter time for this patient visit was 40 min. >50% of the time was  spent in counseling and coordination of care.   Patient/Family are satisfied with care plan and all questions have been answered.  This document was prepared using Dragon voice recognition software and may include unintentional dictation errors.     Ottie Glazier, M.D.  Division of [facility]

## 2020-11-06 NOTE — Progress Notes (Signed)
PROGRESS NOTE    Teresa Krueger  [id number] DOB: 10/12/1933 DOA: 11/01/2020 PCP: Adin Hector, MD  Brief Narrative:   87/F morbidly obese, chronically ill with multiple medical problems namely CAD, chronic diastolic CHF, COPD, chronic lymphedema, CKD stage III AAA, type 2 diabetes mellitus, hypertension, dyslipidemia was undergoing work-up as outpatient this past week due to for presumed angina, chest pressure with shortness of breathy.  She was scheduled to have a stress test last week however due to worsening shortness of breath presented to the emergency room, dyspnea is progressive over the last 1 week, also has intermittent chest pain and pressure, in addition is also noted worsening lower extremity edema and productive cough. -In the ED she was noted to be hypoxic with evidence of fluid overload, subsequently started on BiPAP and IV Lasix -Cardiology was consulted, subsequently had mild bump in creatinine on account of which her Lasix was held temporarily -Cardiac cath which was initially considered was deferred pending stabilization of kidney function -Overnight 5/11 with respiratory distress again requiring BiPAP and IV Lasix  Assessment & Plan:   Acute hypoxic respiratory failure Acute on chronic diastolic CHF COPD Suspected OSA/OHS Mild hemoptysis -Remains clinically volume overloaded, requiring BiPAP nightly and intermittently  -CTA chest on admission and subsequent x-rays all consistent with fluid overload -Finally starting to diurese better, on higher doses of Lasix, 4.9 L negative, urine output not recorded yesterday -Cardiology following, ischemic work-up deferred at this time -2D echo noted EF of 55%, moderate MR and moderate AR -Had worsening respiratory distress 5/14, CT with worsening groundglass opacities, pulmonary input requested as well, CRP is high, procalcitonin is low -Discontinuing antibiotics, holding Plavix on account of low-grade  hemoptysis -Continue IV Lasix, dose decreased due to bump in creatinine -Incentive spirometer, duo nebs, out of bed -BiPAP nightly  AKI on CKD stage IIIa -Baseline creatinine around 1.1, creatinine this admission has been in the 1.4-1.6 range -Could be cardiorenal as well as worsened by contrast for CTA on admission -Cozaar on hold -Creatinine up to 2.0 this morning, diuretic dose decreased  Chest pain/Elevated troponin History of CAD -Could be secondary to demand ischemia -Appreciate cardiology input, ischemic eval deferred in the setting of ongoing respiratory issues and renal insufficiency -Continue statin  History of TIA -Plavix held due to intermittent hemoptysis  Insulin-dependent diabetes mellitus -Continue insulin 70/30, CBGs running higher, insulin dose increased  Hypertension Hyperlipidemia -Cozaar on hold  GERD PPI  Possible mesenteric ischemia Noted on CT angio abdomen from March Vascular surgery input appreciated, angiogram deferred in setting of renal insufficiency -Patient is tolerating p.o. intake with minimal symptoms -Vascular follow-up as outpatient  Chronic lymphedema -Worsened in the setting of  CHF  DVT prophylaxis: Heparin subcu  code Status: DNR Family Communication: No family at bedside, called and updated son Joneen Boers 5/15 Disposition Plan: Status is: Inpatient  Remains inpatient appropriate because:Inpatient level of care appropriate due to severity of illness   Dispo: The patient is from: Home              Anticipated d/c is to: Home versus short-term rehab              Patient currently is not medically stable to d/c.   Difficult to place patient No  Consultants:   Cardiology- [facility]  Vascular surgery   Procedures: None  Antimicrobials:   None    Subjective: -Continues to be short of breath, still coughing up small amounts of blood-tinged sputum with small  dark clots  Objective: Vitals:   11/06/20 0802 11/06/20  0900 11/06/20 1017 11/06/20 1208  BP: (!) 133/50   (!) 125/49  Pulse: 85   72  Resp: 20   18  Temp: 98 F (36.7 C)   98.1 F (36.7 C)  TempSrc: Oral   Oral  SpO2: 92%  92% 95%  Weight:  119.1 kg    Height:        Intake/Output Summary (Last 24 hours) at 11/06/2020 1209 Last data filed at 11/06/2020 0950 Gross per 24 hour  Intake 960 ml  Output 500 ml  Net 460 ml   Filed Weights   11/04/20 0829 11/05/20 0450 11/06/20 0900  Weight: 121.3 kg 122.8 kg 119.1 kg    Examination:  General exam: Obese chronically ill female sitting up in bed, slightly tachypneic, AAOx3 HEENT: Neck obese unable to assess JVD CVS: S1-S2, regular rate rhythm Lungs: Fine bilateral crackles in mid and lower lungs Abdomen: Soft, nontender, bowel sounds present Extremities: 2+ edema, worse on the right Psych: Appropriate mood and affect   Data Reviewed: I have personally reviewed following labs and imaging studies  CBC: Recent Labs  Lab 11/02/20 0427 11/03/20 0457 11/04/20 0451 11/05/20 0544 11/06/20 0532  WBC 7.7 5.4 5.6 11.0* 8.8  NEUTROABS 6.5 3.5 4.2 10.3* 7.7  HGB 9.8* 8.7* 9.0* 9.5* 8.7*  HCT 30.8* 28.3* 29.1* 29.8* 27.6*  MCV 89.0 89.8 89.0 87.9 87.1  PLT 179 151 156 177 517   Basic Metabolic Panel: Recent Labs  Lab 11/02/20 1555 11/03/20 0457 11/04/20 0451 11/05/20 0617 11/06/20 0532  NA 137 139 141 140 138  K 4.7 4.5 4.2 4.6 4.3  CL 98 102 101 100 99  CO2 27 27 29 25 25   GLUCOSE 165* 101* 215* 301* 317*  BUN 97* 98* 96* 99* 110*  CREATININE 1.73* 1.78* 1.53* 1.62* 2.02*  CALCIUM 9.1 8.9 9.0 9.1 9.1   GFR: Estimated Creatinine Clearance: 23.7 mL/min (A) (by C-G formula based on SCr of 2.02 mg/dL (H)). Liver Function Tests: Recent Labs  Lab 11/02/20 0427  AST 29  ALT 18  ALKPHOS 89  BILITOT 0.6  PROT 6.8  ALBUMIN 3.6   No results for input(s): LIPASE, AMYLASE in the last 168 hours. No results for input(s): AMMONIA in the last 168 hours. Coagulation Profile: No  results for input(s): INR, PROTIME in the last 168 hours. Cardiac Enzymes: No results for input(s): CKTOTAL, CKMB, CKMBINDEX, TROPONINI in the last 168 hours. BNP (last 3 results) No results for input(s): PROBNP in the last 8760 hours. HbA1C: No results for input(s): HGBA1C in the last 72 hours. CBG: Recent Labs  Lab 11/05/20 0749 11/05/20 1125 11/05/20 1618 11/05/20 2109 11/06/20 0805  GLUCAP 289* 395* 282* 387* 274*   Lipid Profile: No results for input(s): CHOL, HDL, LDLCALC, TRIG, CHOLHDL, LDLDIRECT in the last 72 hours. Thyroid Function Tests: No results for input(s): TSH, T4TOTAL, FREET4, T3FREE, THYROIDAB in the last 72 hours. Anemia Panel: No results for input(s): VITAMINB12, FOLATE, FERRITIN, TIBC, IRON, RETICCTPCT in the last 72 hours. Sepsis Labs: Recent Labs  Lab 11/04/20 0451 11/05/20 0617 11/06/20 0532  PROCALCITON 0.11 0.18 0.25    Recent Results (from the past 240 hour(s))  SARS CORONAVIRUS 2 (TAT 6-24 HRS) Nasopharyngeal Nasopharyngeal Swab     Status: None   Collection Time: 11/18/2020  5:27 PM   Specimen: Nasopharyngeal Swab  Result Value Ref Range Status   SARS Coronavirus 2 NEGATIVE NEGATIVE Final  Comment: (NOTE) SARS-CoV-2 target nucleic acids are NOT DETECTED.  The SARS-CoV-2 RNA is generally detectable in upper and lower respiratory specimens during the acute phase of infection. Negative results do not preclude SARS-CoV-2 infection, do not rule out co-infections with other pathogens, and should not be used as the sole basis for treatment or other patient management decisions. Negative results must be combined with clinical observations, patient history, and epidemiological information. The expected result is Negative.  Fact Sheet for Patients: SugarRoll.be  Fact Sheet for Healthcare Providers: https://www.woods-mathews.com/  This test is not yet approved or cleared by the [place] FDA and   has been authorized for detection and/or diagnosis of SARS-CoV-2 by FDA under an Emergency Use Authorization (EUA). This EUA will remain  in effect (meaning this test can be used) for the duration of the COVID-19 declaration under Se ction 564(b)(1) of the Act, 21 U.S.C. section 360bbb-3(b)(1), unless the authorization is terminated or revoked sooner.  Performed at [facility], [place] [address], [place], [place] [address]   MRSA PCR Screening     Status: None   Collection Time: 11/18/20 11:21 PM   Specimen: Nasopharyngeal  Result Value Ref Range Status   MRSA by PCR NEGATIVE NEGATIVE Final    Comment:        The GeneXpert MRSA Assay (FDA approved for NASAL specimens only), is one component of a comprehensive MRSA colonization surveillance program. It is not intended to diagnose MRSA infection nor to guide or monitor treatment for MRSA infections. Performed at [facility], [place], [place] [address]   Expectorated Sputum Assessment w Gram Stain, Rflx to Resp Cult     Status: None   Collection Time: 11/20/2020  2:00 AM   Specimen: Sputum  Result Value Ref Range Status   Specimen Description SPUTUM  Final   Special Requests NONE  Final   Sputum evaluation   Final    Sputum specimen not acceptable for testing.  Please recollect.   Coral Ceo RN 0301 10/29/2020 HNM Performed at [facility], [address] [place], [place] [address]    Report Status 11/18/2020 FINAL  Final  Resp Panel by RT-PCR (Flu A&B, Covid) Nasopharyngeal Swab     Status: None   Collection Time: 11/04/20 12:30 PM   Specimen: Nasopharyngeal Swab; Nasopharyngeal(NP) swabs in vial transport medium  Result Value Ref Range Status   SARS Coronavirus 2 by RT PCR NEGATIVE NEGATIVE Final    Comment: (NOTE) SARS-CoV-2 target nucleic acids are NOT DETECTED.  The SARS-CoV-2 RNA is generally detectable in upper respiratory specimens during the acute phase of infection. The  lowest concentration of SARS-CoV-2 viral copies this assay can detect is 138 copies/mL. A negative result does not preclude SARS-Cov-2 infection and should not be used as the sole basis for treatment or other patient management decisions. A negative result may occur with  improper specimen collection/handling, submission of specimen other than nasopharyngeal swab, presence of viral mutation(s) within the areas targeted by this assay, and inadequate number of viral copies(<138 copies/mL). A negative result must be combined with clinical observations, patient history, and epidemiological information. The expected result is Negative.  Fact Sheet for Patients:  EntrepreneurPulse.com.au  Fact Sheet for Healthcare Providers:  IncredibleEmployment.be  This test is no t yet approved or cleared by the [place] FDA and  has been authorized for detection and/or diagnosis of SARS-CoV-2 by FDA under an Emergency Use Authorization (EUA). This EUA will remain  in effect (  meaning this test can be used) for the duration of the COVID-19 declaration under Section 564(b)(1) of the Act, 21 U.S.C.section 360bbb-3(b)(1), unless the authorization is terminated  or revoked sooner.       Influenza A by PCR NEGATIVE NEGATIVE Final   Influenza B by PCR NEGATIVE NEGATIVE Final    Comment: (NOTE) The Xpert Xpress SARS-CoV-2/FLU/RSV plus assay is intended as an aid in the diagnosis of influenza from Nasopharyngeal swab specimens and should not be used as a sole basis for treatment. Nasal washings and aspirates are unacceptable for Xpert Xpress SARS-CoV-2/FLU/RSV testing.  Fact Sheet for Patients: EntrepreneurPulse.com.au  Fact Sheet for Healthcare Providers: IncredibleEmployment.be  This test is not yet approved or cleared by the [place] FDA and has been authorized for detection and/or diagnosis of SARS-CoV-2 by FDA under  an Emergency Use Authorization (EUA). This EUA will remain in effect (meaning this test can be used) for the duration of the COVID-19 declaration under Section 564(b)(1) of the Act, 21 U.S.C. section 360bbb-3(b)(1), unless the authorization is terminated or revoked.  Performed at [facility], [address] [place], [place] [address]      Radiology Studies: CT CHEST WO CONTRAST  Result Date: 11/04/2020 CLINICAL DATA:  Respiratory failure, worsening hypoxia, CHF, concern for infectious process EXAM: CT CHEST WITHOUT CONTRAST TECHNIQUE: Multidetector CT imaging of the chest was performed following the standard protocol without IV contrast. COMPARISON:  10/28/2020 FINDINGS: Cardiovascular: Aortic atherosclerosis. Right chest multi lead pacer. Cardiomegaly. Left and right coronary artery calcifications. No pericardial effusion. Mediastinum/Nodes: No enlarged mediastinal, hilar, or axillary lymph nodes. Thyroid gland, trachea, and esophagus demonstrate no significant findings. Lungs/Pleura: Small to moderate bilateral pleural effusions and associated atelectasis or consolidation, slightly increased compared to prior examination. Very extensive heterogeneous and ground-glass airspace opacity throughout the lungs, substantially increased compared to prior examination. Mild interlobular septal thickening. Upper Abdomen: No acute abnormality. Musculoskeletal: No chest wall mass or suspicious bone lesions identified. IMPRESSION: 1. Small to moderate bilateral pleural effusions and associated atelectasis or consolidation, slightly increased compared to prior examination. 2. Very extensive heterogeneous and ground-glass airspace opacity throughout the lungs, substantially increased compared to prior examination. Mild interlobular septal thickening. Findings are consistent with worsened multifocal infection, edema, and/or ARDS. 3. Cardiomegaly and coronary artery disease. Aortic Atherosclerosis  (ICD10-I70.0). Electronically Signed   By: Eddie Candle M.D.   On: 11/04/2020 16:32    Scheduled Meds: . acetaminophen  650 mg Oral QHS  . ALPRAZolam  0.5 mg Oral BID  . capsaicin   Topical BID  . chlorhexidine  15 mL Mouth Rinse BID  . Chlorhexidine Gluconate Cloth  6 each Topical Q0600  . furosemide  40 mg Intravenous BID  . heparin injection (subcutaneous)  5,000 Units Subcutaneous Q12H  . insulin aspart  0-9 Units Subcutaneous TID WC  . insulin aspart protamine- aspart  18 Units Subcutaneous BID WC  . lidocaine  1 patch Transdermal Q24H  . mouth rinse  15 mL Mouth Rinse q12n4p  . methylPREDNISolone (SOLU-MEDROL) injection  40 mg Intravenous Daily  . multivitamin with minerals  1 tablet Oral QODAY  . mupirocin ointment   Topical BID  . pantoprazole  40 mg Oral Daily  . simvastatin  20 mg Oral q1600  . sodium chloride flush  3 mL Intravenous Q12H  . spironolactone  50 mg Oral Daily  . triamcinolone cream   Topical BID   Continuous Infusions: . sodium chloride       LOS: 10 days  Time spent: 35 minutes  Domenic Polite, MD [facility]  11/06/2020, 12:09 PM

## 2020-11-06 NOTE — Progress Notes (Signed)
Inpatient Diabetes Program Recommendations  AACE/ADA: New Consensus Statement on Inpatient Glycemic Control (2015)  Target Ranges:  Prepandial:   less than 140 mg/dL      Peak postprandial:   less than 180 mg/dL (1-2 hours)      Critically ill patients:  140 - 180 mg/dL   Lab Results  Component Value Date   GLUCAP 274 (H) 11/06/2020   HGBA1C 6.0 (H) 10/28/2020    Review of Glycemic Control Results for Teresa Krueger, Teresa Krueger (MRN [id number]) as of 11/06/2020 08:45  Ref. Range 11/05/2020 11:25 11/05/2020 16:18 11/05/2020 21:09 11/06/2020 08:05  Glucose-Capillary Latest Ref Range: 70 - 99 mg/dL 395 (H) 282 (H) 387 (H) 274 (H)   Diabetes history: DM 2 Outpatient Diabetes medications:  Humalog 75/25 10 units bid Current orders for Inpatient glycemic control:  Solumedrol 40 mg IV daily Novolog 70/30 mix 18 units bid Novolog sensitive tid with meals  Inpatient Diabetes Program Recommendations:   Agree with current orders.  Steroids have been reduced which should help blood sugars.  Will follow.  Thanks,  Adah Perl, RN, BC-ADM Inpatient Diabetes Coordinator Pager [phone] (8a-5p)

## 2020-11-06 NOTE — Progress Notes (Signed)
Occupational Therapy Treatment Patient Details Name: MAISHA BOGEN MRN: [id number] DOB: [birth year] Today's Date: 11/06/2020    History of present illness Keonta Monceaux is an 87 y/o F admitted on 11/02/2020 with c/c of SOB over the last week, pain on L side of neck & back, & BLE edema. Pt being treated for acute decompensated diastolic CHF & hypoxic respiratory failure. PMH: CAD, chronic lymphadema, COPD, CKD stage 3, HLD, HTN, DM, morbid obesity. Cardiac cath on 5/10 canceled due to concerns of kidney function.   OT comments  Ms. Matsushita appeared very ill, weak, and anxious today. She reports feeling as if there is nothing she can do to catch her breath. She does not have the energy to sit up in bed, although agreed to supine-level therex. She was able to perform 5-10 reps of UE and LE movements, but needed to take rest breaks ~ every 2-3 reps. LE edema has increased from last week, and pt states she has 8/10 pain from knees down. Repositioned pt for comfort and skin integrity on heels. Given pt's level of performance today, would recommend she go home (pt's goal) only with 24-hour supervision. Hospice/palliative care referral may also be appropriate at this time. Will continue to follow this lovely pt acutely.   Follow Up Recommendations  Supervision/Assistance - 24 hour;Home health OT;SNF    Equipment Recommendations       Recommendations for Other Services Other (comment) (hospice/palliative care consult?)    Precautions / Restrictions Restrictions Weight Bearing Restrictions: No       Mobility Bed Mobility Overal bed mobility: Needs Assistance Bed Mobility: Rolling Rolling: Max assist         General bed mobility comments: pt unable to come into sitting    Transfers Overall transfer level: Needs assistance               General transfer comment: unable    Balance Overall balance assessment: Needs assistance     Sitting balance - Comments: pt unable        Standing balance comment: pt unable                           ADL either performed or assessed with clinical judgement   ADL Overall ADL's : Needs assistance/impaired                                             Vision Patient Visual Report: No change from baseline     Perception     Praxis      Cognition Arousal/Alertness: Awake/alert Behavior During Therapy: WFL for tasks assessed/performed Overall Cognitive Status: Within Functional Limits for tasks assessed                                          Exercises Total Joint Exercises Ankle Circles/Pumps: AROM;Supine;10 reps Quad Sets: AROM;Supine;5 reps Towel Squeeze: AROM;Supine;5 reps Hip ABduction/ADduction: AROM;Supine;5 reps Other Exercises Other Exercises: Bed level therex, bed mobility, repositioning for pain mgmt, therapeutic listening   Shoulder Instructions       General Comments Pt reports feeling very SOB    Pertinent Vitals/ Pain       Pain Score: 8  Pain Location: LE Pain Intervention(s): Limited activity within  patient's tolerance;Patient requesting pain meds-RN notified;Repositioned  Home Living                                          Prior Functioning/Environment              Frequency  Min 1X/week        Progress Toward Goals  OT Goals(current goals can now be found in the care plan section)  Progress towards OT goals: Progressing toward goals  Acute Rehab OT Goals Patient Stated Goal: to go home and be able to age in place OT Goal Formulation: With patient Time For Goal Achievement: 11/12/20 Potential to Achieve Goals: Good  Plan Discharge plan needs to be updated    Co-evaluation                 AM-PAC OT "6 Clicks" Daily Activity     Outcome Measure   Help from another person eating meals?: A Little Help from another person taking care of personal grooming?: A Lot Help from another person  toileting, which includes using toliet, bedpan, or urinal?: A Lot Help from another person bathing (including washing, rinsing, drying)?: A Lot Help from another person to put on and taking off regular upper body clothing?: A Lot Help from another person to put on and taking off regular lower body clothing?: A Lot 6 Click Score: 13    End of Session Equipment Utilized During Treatment: Oxygen  OT Visit Diagnosis: Unsteadiness on feet (R26.81);Muscle weakness (generalized) (M62.81);Pain   Activity Tolerance Treatment limited secondary to medical complications (Comment);Other (comment) (pt limited by SOB)   Patient Left with call bell/phone within reach;in bed;with bed alarm set;with family/visitor present   Nurse Communication Patient requests pain meds;Other (comment) (pt requests heel pads)        Time: 4818-5631 OT Time Calculation (min): 18 min  Charges: OT General Charges $OT Visit: 1 Visit OT Treatments $Self Care/Home Management : 8-22 mins  Josiah Lobo, PhD, MS, OTR/L 11/06/20, 5:19 PM

## 2020-11-07 ENCOUNTER — Encounter: Payer: Medicare Other | Admitting: Occupational Therapy

## 2020-11-07 ENCOUNTER — Inpatient Hospital Stay: Payer: Medicare Other

## 2020-11-07 DIAGNOSIS — J9621 Acute and chronic respiratory failure with hypoxia: Secondary | ICD-10-CM | POA: Diagnosis not present

## 2020-11-07 LAB — CBC WITH DIFFERENTIAL/PLATELET
Abs Immature Granulocytes: 0.08 10*3/uL — ABNORMAL HIGH (ref 0.00–0.07)
Basophils Absolute: 0 10*3/uL (ref 0.0–0.1)
Basophils Relative: 0 %
Eosinophils Absolute: 0 10*3/uL (ref 0.0–0.5)
Eosinophils Relative: 0 %
HCT: 31.1 % — ABNORMAL LOW (ref 36.0–46.0)
Hemoglobin: 9.9 g/dL — ABNORMAL LOW (ref 12.0–15.0)
Immature Granulocytes: 1 %
Lymphocytes Relative: 5 %
Lymphs Abs: 0.5 10*3/uL — ABNORMAL LOW (ref 0.7–4.0)
MCH: 27.8 pg (ref 26.0–34.0)
MCHC: 31.8 g/dL (ref 30.0–36.0)
MCV: 87.4 fL (ref 80.0–100.0)
Monocytes Absolute: 1.4 10*3/uL — ABNORMAL HIGH (ref 0.1–1.0)
Monocytes Relative: 14 %
Neutro Abs: 7.9 10*3/uL — ABNORMAL HIGH (ref 1.7–7.7)
Neutrophils Relative %: 80 %
Platelets: 233 10*3/uL (ref 150–400)
RBC: 3.56 MIL/uL — ABNORMAL LOW (ref 3.87–5.11)
RDW: 13.3 % (ref 11.5–15.5)
WBC: 9.8 10*3/uL (ref 4.0–10.5)
nRBC: 0.3 % — ABNORMAL HIGH (ref 0.0–0.2)

## 2020-11-07 LAB — BLOOD GAS, ARTERIAL
Acid-Base Excess: 5.4 mmol/L — ABNORMAL HIGH (ref 0.0–2.0)
Bicarbonate: 31.8 mmol/L — ABNORMAL HIGH (ref 20.0–28.0)
FIO2: 0.4
O2 Saturation: 79.2 %
Patient temperature: 37
pCO2 arterial: 55 mmHg — ABNORMAL HIGH (ref 32.0–48.0)
pH, Arterial: 7.37 (ref 7.350–7.450)
pO2, Arterial: 45 mmHg — ABNORMAL LOW (ref 83.0–108.0)

## 2020-11-07 LAB — GLUCOSE, CAPILLARY
Glucose-Capillary: 234 mg/dL — ABNORMAL HIGH (ref 70–99)
Glucose-Capillary: 168 mg/dL — ABNORMAL HIGH (ref 70–99)
Glucose-Capillary: 285 mg/dL — ABNORMAL HIGH (ref 70–99)
Glucose-Capillary: 285 mg/dL — ABNORMAL HIGH (ref 70–99)

## 2020-11-07 LAB — COMPREHENSIVE METABOLIC PANEL
ALT: 18 U/L (ref 0–44)
AST: 28 U/L (ref 15–41)
Albumin: 3.7 g/dL (ref 3.5–5.0)
Alkaline Phosphatase: 80 U/L (ref 38–126)
Anion gap: 13 (ref 5–15)
BUN: 116 mg/dL — ABNORMAL HIGH (ref 8–23)
CO2: 28 mmol/L (ref 22–32)
Calcium: 9.4 mg/dL (ref 8.9–10.3)
Chloride: 99 mmol/L (ref 98–111)
Creatinine, Ser: 1.86 mg/dL — ABNORMAL HIGH (ref 0.44–1.00)
GFR, Estimated: 26 mL/min — ABNORMAL LOW (ref >60)
Glucose, Bld: 233 mg/dL — ABNORMAL HIGH (ref 70–99)
Potassium: 4.2 mmol/L (ref 3.5–5.1)
Sodium: 140 mmol/L (ref 135–145)
Total Bilirubin: 1 mg/dL (ref 0.3–1.2)
Total Protein: 6.8 g/dL (ref 6.5–8.1)

## 2020-11-07 MED ORDER — INSULIN ASPART PROT & ASPART (70-30 MIX) 100 UNIT/ML ~~LOC~~ SUSP
18.0000 [IU] | Freq: Two times a day (BID) | SUBCUTANEOUS | Status: DC
  Administered 2020-11-07 – 2020-11-08 (×3): 18 [IU] via SUBCUTANEOUS
  Filled 2020-11-07 (×4): qty 10

## 2020-11-07 NOTE — Progress Notes (Signed)
OT Cancellation Note  Patient Details Name: MARVENE STROHM MRN: [id number] DOB: 08-27-1933   Cancelled Treatment:    Reason Eval/Treat Not Completed: Medical issues which prohibited therapy. Pt severely SOB, with recurrent hemoptysis s/p subQ heparin. Awaiting response from ED. WIll resume services once medically ready.  Josiah Lobo 11/07/2020, 11:56 AM

## 2020-11-07 NOTE — Plan of Care (Signed)
  Problem: Activity: Goal: Risk for activity intolerance will decrease Outcome: Progressing   Problem: Pain Managment: Goal: General experience of comfort will improve Outcome: Progressing   Problem: Safety: Goal: Ability to remain free from injury will improve Outcome: Progressing   

## 2020-11-07 NOTE — Progress Notes (Signed)
PROGRESS NOTE    Teresa Krueger  VZD:[id number] DOB: September 17, 1933 DOA: 2020/11/17 PCP: Adin Hector, MD  Brief Narrative:   87/F morbidly obese, chronically ill with multiple medical problems namely CAD, chronic diastolic CHF, COPD, chronic lymphedema, CKD stage III AAA, type 2 diabetes mellitus, hypertension  presented to the emergency room with worsening dyspnea on exertion over the last 1 week, intermittent chest pain, worsening lower extremity edema and productive cough. -In the ED she was noted to be hypoxic with evidence of fluid overload, CTA chest was negative for PE, subsequently started on BiPAP and IV Lasix -Seen by cardiology initially, a cath was considered however subsequently deferred due to worsening kidney function -Has had a tenuous hospital course with respiratory distress requiring BiPAP intermittently and QHS, back on diuretics -Pulmonary following as well, also continues to have scant hemoptysis, Plavix held and DVT prophylaxis  Assessment & Plan:   Acute hypoxic respiratory failure Acute on chronic diastolic CHF COPD Suspected OSA/OHS Mild hemoptysis -Remains clinically volume overloaded, requiring BiPAP nightly and intermittently  -CTA chest on admission and subsequent x-rays all consistent with fluid overload -Diuresing with IV Lasix, -5 L so far -Followed by cardiology initially, ischemic work-up deferred at this time -2D echo noted EF of 55%, moderate MR and moderate AR -Had worsening respiratory distress 5/14, CT with worsening groundglass opacities, pulmonary following, CRP is high, procalcitonin is low -holding Plavix on account of low-grade hemoptysis, DC DVT prophylaxis too -Continue IV Lasix, dose decreased due to bump in creatinine -Continue IV steroids, dose decreased, could transition to oral tomorrow -Incentive spirometer, duo nebs, out of bed -BiPAP nightly  AKI on CKD stage IIIa -Baseline creatinine around 1.1, creatinine this admission has  been in the 1.4-1.6 range -Could be cardiorenal as well as worsened by contrast for CTA on admission -Cozaar discontinued -Creatinine trended up to 2.0 yesterday, diuretic dose decreased, currently 1.8, continue to monitor   Chest pain/Elevated troponin History of CAD -Could be secondary to demand ischemia -Appreciate cardiology input, ischemic eval deferred in the setting of ongoing respiratory issues and renal insufficiency -Continue statin  History of TIA -Plavix held due to intermittent hemoptysis  Insulin-dependent diabetes mellitus -Continue insulin 70/30, CBGs running higher, insulin dose increased  Hypertension Hyperlipidemia -Cozaar on hold  GERD PPI  Possible mesenteric ischemia Noted on CT angio abdomen from March Vascular surgery input appreciated, angiogram deferred in setting of renal insufficiency -Patient is tolerating p.o. intake with minimal symptoms -Vascular follow-up as outpatient  Chronic lymphedema -Worsened in the setting of  CHF  DVT prophylaxis: SCDs due to scant hemoptysis code Status: DNR Family Communication: No family at bedside, called and updated son Joneen Boers yesterday Disposition Plan: Status is: Inpatient  Remains inpatient appropriate because:Inpatient level of care appropriate due to severity of illness   Dispo: The patient is from: Home              Anticipated d/c is to: Home versus short-term rehab              Patient currently is not medically stable to d/c.   Difficult to place patient No  Consultants:   Cardiology- [facility]  Vascular surgery   Procedures: None  Antimicrobials:   None    Subjective: -Complains of shortness of breath, still coughing up small amounts of blood-tinged sputum  Objective: Vitals:   11/07/20 0952 11/07/20 1018 11/07/20 1038 11/07/20 1117  BP:    (!) 139/43  Pulse:    77  Resp:    18  Temp:    97.6 F (36.4 C)  TempSrc:      SpO2: 98% 92% 92% 93%  Weight:      Height:         Intake/Output Summary (Last 24 hours) at 11/07/2020 1249 Last data filed at 11/07/2020 0155 Gross per 24 hour  Intake 340 ml  Output 1050 ml  Net -710 ml   Filed Weights   11/04/20 0829 11/05/20 0450 11/06/20 0900  Weight: 121.3 kg 122.8 kg 119.1 kg    Examination:  General exam: Obese chronically ill female, sitting up in bed, less tachypneic today, AAOx3 CVS: S1-S2, irregularly irregular rhythm Lungs: Fine bilateral crackles in mid and lower lungs Abdomen: Soft, nontender, bowel sounds present Extremities: 1+ edema, worse in the right Psych: Appropriate mood and affect   Data Reviewed: I have personally reviewed following labs and imaging studies  CBC: Recent Labs  Lab 11/03/20 0457 11/04/20 0451 11/05/20 0544 11/06/20 0532 11/07/20 0554  WBC 5.4 5.6 11.0* 8.8 9.8  NEUTROABS 3.5 4.2 10.3* 7.7 7.9*  HGB 8.7* 9.0* 9.5* 8.7* 9.9*  HCT 28.3* 29.1* 29.8* 27.6* 31.1*  MCV 89.8 89.0 87.9 87.1 87.4  PLT 151 156 177 184 0000000   Basic Metabolic Panel: Recent Labs  Lab 11/03/20 0457 11/04/20 0451 11/05/20 0617 11/06/20 0532 11/07/20 0554  NA 139 141 140 138 140  K 4.5 4.2 4.6 4.3 4.2  CL 102 101 100 99 99  CO2 27 29 25 25 28   GLUCOSE 101* 215* 301* 317* 233*  BUN 98* 96* 99* 110* 116*  CREATININE 1.78* 1.53* 1.62* 2.02* 1.86*  CALCIUM 8.9 9.0 9.1 9.1 9.4   GFR: Estimated Creatinine Clearance: 25.7 mL/min (A) (by C-G formula based on SCr of 1.86 mg/dL (H)). Liver Function Tests: Recent Labs  Lab 11/02/20 0427 11/07/20 0554  AST 29 28  ALT 18 18  ALKPHOS 89 80  BILITOT 0.6 1.0  PROT 6.8 6.8  ALBUMIN 3.6 3.7   No results for input(s): LIPASE, AMYLASE in the last 168 hours. No results for input(s): AMMONIA in the last 168 hours. Coagulation Profile: No results for input(s): INR, PROTIME in the last 168 hours. Cardiac Enzymes: No results for input(s): CKTOTAL, CKMB, CKMBINDEX, TROPONINI in the last 168 hours. BNP (last 3 results) No results for  input(s): PROBNP in the last 8760 hours. HbA1C: No results for input(s): HGBA1C in the last 72 hours. CBG: Recent Labs  Lab 11/06/20 1212 11/06/20 1627 11/06/20 2052 11/07/20 0750 11/07/20 1228  GLUCAP 208* 133* 239* 234* 168*   Lipid Profile: No results for input(s): CHOL, HDL, LDLCALC, TRIG, CHOLHDL, LDLDIRECT in the last 72 hours. Thyroid Function Tests: No results for input(s): TSH, T4TOTAL, FREET4, T3FREE, THYROIDAB in the last 72 hours. Anemia Panel: No results for input(s): VITAMINB12, FOLATE, FERRITIN, TIBC, IRON, RETICCTPCT in the last 72 hours. Sepsis Labs: Recent Labs  Lab 11/04/20 0451 11/05/20 0617 11/06/20 0532  PROCALCITON 0.11 0.18 0.25    Recent Results (from the past 240 hour(s))  Expectorated Sputum Assessment w Gram Stain, Rflx to Resp Cult     Status: None   Collection Time: 11/05/2020  2:00 AM   Specimen: Sputum  Result Value Ref Range Status   Specimen Description SPUTUM  Final   Special Requests NONE  Final   Sputum evaluation   Final    Sputum specimen not acceptable for testing.  Please recollect.   Coral Ceo RN [id number] 10/31/2020 HNM Performed at  [facility], [address] [place], [place] [address]    Report Status 11/13/2020 FINAL  Final  Resp Panel by RT-PCR (Flu A&B, Covid) Nasopharyngeal Swab     Status: None   Collection Time: 11/04/20 12:30 PM   Specimen: Nasopharyngeal Swab; Nasopharyngeal(NP) swabs in vial transport medium  Result Value Ref Range Status   SARS Coronavirus 2 by RT PCR NEGATIVE NEGATIVE Final    Comment: (NOTE) SARS-CoV-2 target nucleic acids are NOT DETECTED.  The SARS-CoV-2 RNA is generally detectable in upper respiratory specimens during the acute phase of infection. The lowest concentration of SARS-CoV-2 viral copies this assay can detect is 138 copies/mL. A negative result does not preclude SARS-Cov-2 infection and should not be used as the sole basis for treatment or other patient management  decisions. A negative result may occur with  improper specimen collection/handling, submission of specimen other than nasopharyngeal swab, presence of viral mutation(s) within the areas targeted by this assay, and inadequate number of viral copies(<138 copies/mL). A negative result must be combined with clinical observations, patient history, and epidemiological information. The expected result is Negative.  Fact Sheet for Patients:  EntrepreneurPulse.com.au  Fact Sheet for Healthcare Providers:  IncredibleEmployment.be  This test is no t yet approved or cleared by the [place] FDA and  has been authorized for detection and/or diagnosis of SARS-CoV-2 by FDA under an Emergency Use Authorization (EUA). This EUA will remain  in effect (meaning this test can be used) for the duration of the COVID-19 declaration under Section 564(b)(1) of the Act, 21 U.S.C.section 360bbb-3(b)(1), unless the authorization is terminated  or revoked sooner.       Influenza A by PCR NEGATIVE NEGATIVE Final   Influenza B by PCR NEGATIVE NEGATIVE Final    Comment: (NOTE) The Xpert Xpress SARS-CoV-2/FLU/RSV plus assay is intended as an aid in the diagnosis of influenza from Nasopharyngeal swab specimens and should not be used as a sole basis for treatment. Nasal washings and aspirates are unacceptable for Xpert Xpress SARS-CoV-2/FLU/RSV testing.  Fact Sheet for Patients: EntrepreneurPulse.com.au  Fact Sheet for Healthcare Providers: IncredibleEmployment.be  This test is not yet approved or cleared by the [place] FDA and has been authorized for detection and/or diagnosis of SARS-CoV-2 by FDA under an Emergency Use Authorization (EUA). This EUA will remain in effect (meaning this test can be used) for the duration of the COVID-19 declaration under Section 564(b)(1) of the Act, 21 U.S.C. section 360bbb-3(b)(1), unless the  authorization is terminated or revoked.  Performed at [facility], [address], [place], [place] [address]      Radiology Studies: No results found.  Scheduled Meds: . ALPRAZolam  0.5 mg Oral BID  . capsaicin   Topical BID  . chlorhexidine  15 mL Mouth Rinse BID  . Chlorhexidine Gluconate Cloth  6 each Topical Q0600  . furosemide  40 mg Intravenous BID  . insulin aspart  0-9 Units Subcutaneous TID WC  . insulin aspart protamine- aspart  18 Units Subcutaneous BID WC  . lidocaine  1 patch Transdermal Q24H  . mouth rinse  15 mL Mouth Rinse q12n4p  . methylPREDNISolone (SOLU-MEDROL) injection  40 mg Intravenous Daily  . multivitamin with minerals  1 tablet Oral QODAY  . mupirocin ointment   Topical BID  . pantoprazole  40 mg Oral Daily  . simvastatin  20 mg Oral q1600  . sodium chloride flush  3 mL Intravenous Q12H  . spironolactone  50 mg Oral Daily  .  triamcinolone cream   Topical BID   Continuous Infusions: . sodium chloride       LOS: 11 days   Time spent: 35 minutes  Domenic Polite, MD [facility]  11/07/2020, 12:49 PM

## 2020-11-07 NOTE — Progress Notes (Signed)
Pulmonary Medicine          Date: 11/07/2020,   MRN# [id number] Teresa Krueger 03-31-1934     AdmissionWeight: 117 kg                 CurrentWeight: 119.1 kg   Referring physician: Dr Broadus John   CHIEF COMPLAINT:   Acute on chronic hypoxemic respiratory failure   HISTORY OF PRESENT ILLNESS   JESSIA KIEF is a 87 y.o. female with medical history significant of coronary artery disease, chronic lymphedema, COPD, chronic kidney disease stage III, hyperlipidemia, hypertension, diabetes and morbid obesity who was being worked up for angina at home.  Patient was supposed to have a cardiac stress test this week.  She started having chest pressure today shortness of breath.  The shortness of breath has been progressive over the last week.  Currently present even at rest without exertion.  She has pain that is been going on in the left side going to the neck and the back.  It is more pressure that is worse with exertion.  Patient also has noted progressive worsening of leg swelling.  She has cough and frothy sputum.  No fever or chills.  No sick contact.  Patient uses oxygen only at night at about 2 L.  Patient also recently was set up with a Holter monitor which she completed but the results are still pending.  She came to the ER where she was found to be hypoxic evidence of fluid overload as well as anxiety.  Patient progressively got worse in the ER and now is on BiPAP.  She is hypoxic and appears to have evidence of anasarca.  She has been admitted to the hospital for angina with acute on chronic respiratory failure with hypoxia, temperature 98 blood pressure 153/55, pulse 94 respirate of 29 oxygen sat 92% on room air.  Currently 96% on BiPAP.  Hemoglobin is 9.4.  Potassium 5.2.  BUN 63 and creatinine 1.40.  Glucose is 210.  BNP is 593.  Initial troponin 24-second troponin 25.  COVID-19 screen is currently pending.  Chest x-ray showed slight left midlung atelectasis.  No edema or  airspace opacity. Despite several days of diuresis her respiratory status is not improved and she continues to have hemoptysis with consult placed for PCCM for additional evaluation from pulmonary perspective.   -11/06/20- Vitals improved, weaning O2.  Patient diuresed >4L thus far.  Reviewed blood work, CBC is reassuring without concerning findings, metabolic profile reviewed with significant hyperglycemia, acute on chronic renal insufficiency in the context of anasarca with CHF and CKD/OHS.  Optimizing diuretic continuing Lasix will change to 40 twice daily adding Aldactone 50 daily.  Blood pressure is good with mild hypertension which will be useful while diuresing.  We will need to concentrate on decreasing additional nephrotoxins while diuresing.  Also will perform US liver to investigate steatohepatitis as additional etiology of recurrent fluid retention with anasarca.   11/07/20-patient still with hemoptysis, but its slowing down.  She is on heparin [place] for dvt ppx, she has terribly swollen ankles with tenderness unable to use SCDs.  She has severe comorbid conditions with complex decision making.  Fluid balance >5L net negative. On 5L O2   PAST MEDICAL HISTORY   Past Medical History:  Diagnosis Date  . Arthritis   . Asthma   . Barrett's esophagus   . Breast cancer ([place]) 1999   LT MASTECTOMY  . Bronchiectasis ([place])   . COPD (chronic  obstructive pulmonary disease) ([place])   . Diabetes mellitus ([place])   . Hypercholesterolemia   . Hypertension   . Lymphedema   . Mini stroke ([place])   . Osteoporosis   . Pacemaker      SURGICAL HISTORY   Past Surgical History:  Procedure Laterality Date  . CARPAL TUNNEL RELEASE     right  . CATARACT EXTRACTION     bilateral  . GALLBLADDER SURGERY    . MASTECTOMY Left 1999   BREAST CA  . PACEMAKER INSERTION Right 03/21/2015   Procedure: INSERTION PACEMAKER;  Surgeon: Isaias Cowman, MD;  Location: [facility] ORS;  Service: Cardiovascular;  Laterality:  Right;  . TOTAL HIP ARTHROPLASTY  2001   left  . TRIGGER FINGER RELEASE     bilateral     FAMILY HISTORY   Family History  Problem Relation Age of Onset  . Heart disease Paternal Grandmother   . Diabetes Paternal Grandmother   . Heart disease Father   . Heart disease Mother   . Heart disease Maternal Grandmother   . Stroke Maternal Aunt   . Breast cancer Paternal Aunt 94  . Lung cancer Brother 91  . Kidney disease Cousin   . Bladder Cancer Neg Hx      SOCIAL HISTORY   Social History   Tobacco Use  . Smoking status: Former Smoker    Packs/day: 1.00    Years: 30.00    Pack years: 30.00    Types: Cigarettes    Quit date: 12/27/1992    Years since quitting: 27.8  . Smokeless tobacco: Never Used  Substance Use Topics  . Alcohol use: No  . Drug use: No     MEDICATIONS    Home Medication:    Current Medication:  Current Facility-Administered Medications:  .  0.9 %  sodium chloride infusion, 250 mL, Intravenous, PRN, Teodoro Spray, MD .  ALPRAZolam Duanne Moron) tablet 0.5 mg, 0.5 mg, Oral, BID, 0.5 mg at 11/06/20 1100 **AND** ALPRAZolam (XANAX) tablet 0.5 mg, 0.5 mg, Oral, Daily PRN, Renda Rolls, RPH, 0.5 mg at 11/06/20 2043 .  calcium carbonate (TUMS - dosed in mg elemental calcium) chewable tablet 200 mg of elemental calcium, 1 tablet, Oral, PRN, Jonelle Sidle, Mohammad L, MD, 200 mg of elemental calcium at 11/02/20 0928 .  capsaicin (ZOSTRIX) 0.025 % cream, , Topical, BID, Priscella Mann, Sudheer B, MD, Given at 11/06/20 1100 .  chlorhexidine (PERIDEX) 0.12 % solution 15 mL, 15 mL, Mouth Rinse, BID, Jonelle Sidle, Mohammad L, MD, 15 mL at 11/06/20 2042 .  Chlorhexidine Gluconate Cloth 2 % PADS 6 each, 6 each, Topical, Q0600, Elwyn Reach, MD, 6 each at 11/07/20 0521 .  furosemide (LASIX) injection 40 mg, 40 mg, Intravenous, BID, Lanney Gins, Jaia Alonge, MD, 40 mg at 11/06/20 1733 .  heparin injection 5,000 Units, 5,000 Units, Subcutaneous, Q12H, Ottie Glazier, MD, 5,000 Units at  11/06/20 2044 .  insulin aspart (novoLOG) injection 0-9 Units, 0-9 Units, Subcutaneous, TID WC, Domenic Polite, MD, 1 Units at 11/06/20 1733 .  insulin aspart protamine- aspart (NOVOLOG MIX 70/30) injection 18 Units, 18 Units, Subcutaneous, BID WC, Domenic Polite, MD, 18 Units at 11/06/20 1733 .  levalbuterol (XOPENEX) nebulizer solution 0.63 mg, 0.63 mg, Nebulization, Q6H PRN, Lanney Gins, Beck Cofer, MD, 0.63 mg at 11/06/20 2008 .  lidocaine (LIDODERM) 5 % 1 patch, 1 patch, Transdermal, Q24H, Sreenath, Sudheer B, MD, 1 patch at November 27, 2020 0824 .  magnesium hydroxide (MILK OF MAGNESIA) suspension 30 mL, 30 mL, Oral, QHS PRN, Gala Romney  L, MD .  MEDLINE mouth rinse, 15 mL, Mouth Rinse, q12n4p, Garba, Mohammad L, MD, 15 mL at 11/06/20 1733 .  methocarbamol (ROBAXIN) tablet 500 mg, 500 mg, Oral, Q8H PRN, Priscella Mann, Sudheer B, MD, 500 mg at 10/26/2020 0821 .  methylPREDNISolone sodium succinate (SOLU-MEDROL) 40 mg/mL injection 40 mg, 40 mg, Intravenous, Daily, Lanney Gins, Loneta Tamplin, MD, 40 mg at 11/06/20 1100 .  multivitamin with minerals tablet 1 tablet, 1 tablet, Oral, QODAY, Elwyn Reach, MD, 1 tablet at 11/05/20 1012 .  mupirocin ointment (BACTROBAN) 2 %, , Topical, BID, Elwyn Reach, MD, Given at 11/06/20 2043 .  ondansetron (ZOFRAN) injection 4 mg, 4 mg, Intravenous, Q6H PRN, Garba, Mohammad L, MD .  pantoprazole (PROTONIX) EC tablet 40 mg, 40 mg, Oral, Daily, Jonelle Sidle, Mohammad L, MD, 40 mg at 11/06/20 1100 .  simethicone (MYLICON) chewable tablet 80 mg, 80 mg, Oral, QID PRN, Ralene Muskrat B, MD, 80 mg at 10/31/20 1744 .  simvastatin (ZOCOR) tablet 20 mg, 20 mg, Oral, q1600, Jonelle Sidle, Mohammad L, MD, 20 mg at 11/06/20 1733 .  sodium chloride (OCEAN) 0.65 % nasal spray 1 spray, 1 spray, Each Nare, PRN, Domenic Polite, MD .  sodium chloride flush (NS) 0.9 % injection 3 mL, 3 mL, Intravenous, Q12H, Teodoro Spray, MD, 3 mL at 11/06/20 2045 .  sodium chloride flush (NS) 0.9 % injection 3 mL, 3 mL,  Intravenous, PRN, Teodoro Spray, MD .  spironolactone (ALDACTONE) tablet 50 mg, 50 mg, Oral, Daily, Ottie Glazier, MD, 50 mg at 11/06/20 0832 .  triamcinolone cream (KENALOG) 0.1 % cream, , Topical, BID, Elwyn Reach, MD, Given at 11/06/20 2044    ALLERGIES   Aspirin, Celebrex [celecoxib], Meloxicam, Nsaids, Quetiapine, Rofecoxib, Sulfa antibiotics, Tolmetin, Bactrim [sulfamethoxazole-trimethoprim], Biguanide copolymer, Buprenorphine hcl, Cetirizine, Ciprofloxacin, Codeine, Dextromethorphan hbr, Dextromethorphan polistirex er, Furosemide, Hydrocodone, Metformin, Morphine and related, Penicillins, Pregabalin, Promethazine, Propoxyphene, Propoxyphene, Quinolones, Seroquel [quetiapine fumarate], Serotonin reuptake inhibitors (ssris), Tramadol, and Ultracet [tramadol-acetaminophen]     REVIEW OF SYSTEMS    Review of Systems:  Gen:  Denies  fever, sweats, chills weigh loss  HEENT: Denies blurred vision, double vision, ear pain, eye pain, hearing loss, nose bleeds, sore throat Cardiac:  No dizziness, chest pain or heaviness, chest tightness,edema Resp:   Denies cough or sputum porduction, shortness of breath,wheezing, hemoptysis,  Gi: Denies swallowing difficulty, stomach pain, nausea or vomiting, diarrhea, constipation, bowel incontinence Gu:  Denies bladder incontinence, burning urine Ext:   Denies Joint pain, stiffness or swelling Skin: Denies  skin rash, easy bruising or bleeding or hives Endoc:  Denies polyuria, polydipsia , polyphagia or weight change Psych:   Denies depression, insomnia or hallucinations   Other:  All other systems negative   VS: BP (!) 147/50 (BP Location: Right Arm)   Pulse 76   Temp 98.6 F (37 C) (Oral)   Resp 18   Ht 4\' 11"  (1.499 m)   Wt 119.1 kg   SpO2 96%   BMI 53.02 kg/m      PHYSICAL EXAM    GENERAL:NAD, no fevers, chills, no weakness no fatigue HEAD: Normocephalic, atraumatic.  EYES: Pupils equal, round, reactive to light.  Extraocular muscles intact. No scleral icterus.  MOUTH: Moist mucosal membrane. Dentition intact. No abscess noted.  EAR, NOSE, THROAT: Clear without exudates. No external lesions.  NECK: Supple. No thyromegaly. No nodules. No JVD.  PULMONARY: rhonchi bilaterally.  CARDIOVASCULAR: S1 and S2. Regular rate and rhythm. No murmurs, rubs, or gallops. No edema. Pedal pulses  2+ bilaterally.  GASTROINTESTINAL: Soft, nontender, nondistended. No masses. Positive bowel sounds. No hepatosplenomegaly.  MUSCULOSKELETAL: No swelling, clubbing, or edema. Range of motion full in all extremities.  NEUROLOGIC: Cranial nerves II through XII are intact. No gross focal neurological deficits. Sensation intact. Reflexes intact.  SKIN: No ulceration, lesions, rashes, or cyanosis. Skin warm and dry. Turgor intact.  PSYCHIATRIC: Mood, affect within normal limits. The patient is awake, alert and oriented x 3. Insight, judgment intact.       IMAGING    DG Chest 2 View  Result Date: 11/18/2020 CLINICAL DATA:  Shortness of breath EXAM: CHEST - 2 VIEW COMPARISON:  December 07, 2018. FINDINGS: There is no edema or airspace opacity. There is slight atelectasis in the left mid lung. Heart is upper normal in size with pacemaker leads attached to the right atrium and right ventricle. No adenopathy. There is aortic atherosclerosis. There is degenerative change in the thoracic spine. IMPRESSION: Slight left midlung atelectasis. No edema or airspace opacity. Heart upper normal in size with pacemaker leads attached to right atrium and right ventricle. Aortic Atherosclerosis (ICD10-I70.0). Electronically Signed   By: Lowella Grip III M.D.   On: 10/30/2020 17:08   CT CHEST WO CONTRAST  Result Date: 11/04/2020 CLINICAL DATA:  Respiratory failure, worsening hypoxia, CHF, concern for infectious process EXAM: CT CHEST WITHOUT CONTRAST TECHNIQUE: Multidetector CT imaging of the chest was performed following the standard protocol without IV  contrast. COMPARISON:  10/28/2020 FINDINGS: Cardiovascular: Aortic atherosclerosis. Right chest multi lead pacer. Cardiomegaly. Left and right coronary artery calcifications. No pericardial effusion. Mediastinum/Nodes: No enlarged mediastinal, hilar, or axillary lymph nodes. Thyroid gland, trachea, and esophagus demonstrate no significant findings. Lungs/Pleura: Small to moderate bilateral pleural effusions and associated atelectasis or consolidation, slightly increased compared to prior examination. Very extensive heterogeneous and ground-glass airspace opacity throughout the lungs, substantially increased compared to prior examination. Mild interlobular septal thickening. Upper Abdomen: No acute abnormality. Musculoskeletal: No chest wall mass or suspicious bone lesions identified. IMPRESSION: 1. Small to moderate bilateral pleural effusions and associated atelectasis or consolidation, slightly increased compared to prior examination. 2. Very extensive heterogeneous and ground-glass airspace opacity throughout the lungs, substantially increased compared to prior examination. Mild interlobular septal thickening. Findings are consistent with worsened multifocal infection, edema, and/or ARDS. 3. Cardiomegaly and coronary artery disease. Aortic Atherosclerosis (ICD10-I70.0). Electronically Signed   By: Eddie Candle M.D.   On: 11/04/2020 16:32   CT ANGIO CHEST PE W OR WO CONTRAST  Result Date: 10/28/2020 CLINICAL DATA:  87 year old female with concern for pulmonary embolism. EXAM: CT ANGIOGRAPHY CHEST WITH CONTRAST TECHNIQUE: Multidetector CT imaging of the chest was performed using the standard protocol during bolus administration of intravenous contrast. Multiplanar CT image reconstructions and MIPs were obtained to evaluate the vascular anatomy. CONTRAST:  2mL OMNIPAQUE IOHEXOL 350 MG/ML SOLN COMPARISON:  Chest CT dated 02/07/2016. FINDINGS: Cardiovascular: Mild cardiomegaly. No pericardial effusion. There is  coronary vascular calcification and calcification of the mitral annulus. Right pectoral pacemaker device. Advanced atherosclerotic calcification of the thoracic aorta. No aneurysmal dilatation. Evaluation of the pulmonary arteries is limited due to respiratory motion artifact. No large or central pulmonary artery embolus identified. Mediastinum/Nodes: No hilar or mediastinal adenopathy. The esophagus is grossly unremarkable. No mediastinal fluid collection. Lungs/Pleura: There are small bilateral pleural effusions with bibasilar atelectasis. Diffuse interstitial and interlobular septal prominence and patchy bilateral ground-glass opacities consistent with vascular congestion and edema. Scattered ground-glass nodularity may represent edema or pneumonia. Clinical correlation is recommended. No pneumothorax. The  central airways are patent. Upper Abdomen: There is a 15 mm hypodense lesion in the right lobe of the liver. Cholecystectomy. Indeterminate subcentimeter left renal hypodense lesion. Musculoskeletal: Osteopenia with scoliosis and degenerative changes of the spine. No acute osseous pathology. Review of the MIP images confirms the above findings. IMPRESSION: 1. No CT evidence of central pulmonary artery embolus. 2. Mild cardiomegaly with findings of CHF and small bilateral pleural effusions. Scattered ground-glass nodularity may represent edema or pneumonia. Clinical correlation is recommended. 3. Aortic Atherosclerosis (ICD10-I70.0). Electronically Signed   By: Anner Crete M.D.   On: 10/28/2020 01:14   DG Chest Port 1 View  Result Date: 11/03/2020 CLINICAL DATA:  Hypoxia. EXAM: PORTABLE CHEST 1 VIEW COMPARISON:  Radiographs 11/11/2020 and 11/08/2020.  CT 10/28/2020. FINDINGS: 0934 hours. Right subclavian pacemaker leads appear unchanged over the right atrium and right ventricle. There is stable mild cardiomegaly and aortic atherosclerosis. There are progressive nodular airspace opacities in both lungs  with small bilateral pleural effusions. No evidence of pneumothorax. The bones appear unchanged. IMPRESSION: Progressive nodular airspace opacities in both lungs suspicious for multifocal pneumonia, possibly viral in etiology. There may be a component of pulmonary edema as well, and small bilateral pleural effusions are present. Electronically Signed   By: Richardean Sale M.D.   On: 11/03/2020 10:04   DG Chest Port 1 View  Result Date: 10/31/2020 CLINICAL DATA:  87 year old female with wheezing. EXAM: PORTABLE CHEST 1 VIEW COMPARISON:  Chest radiograph dated 10/28/2020. FINDINGS: There is cardiomegaly with vascular congestion and edema. Pneumonia is not excluded clinical correlation recommended. Small bilateral pleural effusion. No pneumothorax. Atherosclerotic calcification of the aorta. Right pectoral pacemaker device. No acute osseous pathology. IMPRESSION: Cardiomegaly with findings of CHF. Pneumonia is not excluded. Electronically Signed   By: Anner Crete M.D.   On: 11/04/2020 02:26   ECHOCARDIOGRAM COMPLETE  Result Date: 11/05/2020    ECHOCARDIOGRAM REPORT   Patient Name:   Teresa Krueger [facility] Date of Exam: 11/06/2020 Medical Rec #:  [id number]        Height:       59.0 in Accession #:    [id number]       Weight:       275.8 lb Date of Birth:  03-24-1934        BSA:          2.114 m Patient Age:    32 years         BP:           128/75 mmHg Patient Gender: F                HR:           78 bpm. Exam Location:  [facility] Procedure: 2D Echo, Color Doppler and Cardiac Doppler Indications:     CHF- acute diastolic [id number]  History:         Patient has prior history of Echocardiogram examinations, most                  recent 03/17/2015. Pacemaker, COPD; Risk Factors:Hypertension                  and Diabetes.  Sonographer:     Sherrie Sport RDCS (AE) Referring Phys:  [place] Diagnosing Phys: Isaias Cowman MD IMPRESSIONS  1. Left ventricular ejection fraction, by estimation, is 55 to 60%. The left  ventricle has normal function. The left ventricle has no regional wall motion abnormalities. Left ventricular diastolic parameters  were normal.  2. Right ventricular systolic function is normal. The right ventricular size is normal.  3. The mitral valve is normal in structure. Mild to moderate mitral valve regurgitation. No evidence of mitral stenosis.  4. The aortic valve is normal in structure. Aortic valve regurgitation is moderate. No aortic stenosis is present.  5. The inferior vena cava is normal in size with greater than 50% respiratory variability, suggesting right atrial pressure of 3 mmHg. FINDINGS  Left Ventricle: Left ventricular ejection fraction, by estimation, is 55 to 60%. The left ventricle has normal function. The left ventricle has no regional wall motion abnormalities. The left ventricular internal cavity size was normal in size. There is  no left ventricular hypertrophy. Left ventricular diastolic parameters were normal. Right Ventricle: The right ventricular size is normal. No increase in right ventricular wall thickness. Right ventricular systolic function is normal. Left Atrium: Left atrial size was normal in size. Right Atrium: Right atrial size was normal in size. Pericardium: There is no evidence of pericardial effusion. Mitral Valve: The mitral valve is normal in structure. Mild to moderate mitral valve regurgitation. No evidence of mitral valve stenosis. Tricuspid Valve: The tricuspid valve is normal in structure. Tricuspid valve regurgitation is mild . No evidence of tricuspid stenosis. Aortic Valve: The aortic valve is normal in structure. Aortic valve regurgitation is moderate. No aortic stenosis is present. Aortic valve mean gradient measures 15.7 mmHg. Aortic valve peak gradient measures 27.2 mmHg. Aortic valve area, by VTI measures  0.86 cm. Pulmonic Valve: The pulmonic valve was normal in structure. Pulmonic valve regurgitation is not visualized. No evidence of pulmonic stenosis.  Aorta: The aortic root is normal in size and structure. Venous: The inferior vena cava is normal in size with greater than 50% respiratory variability, suggesting right atrial pressure of 3 mmHg. IAS/Shunts: No atrial level shunt detected by color flow Doppler.  LEFT VENTRICLE PLAX 2D LVIDd:         5.12 cm  Diastology LVIDs:         3.55 cm  LV e' medial:    3.59 cm/s LV PW:         1.40 cm  LV E/e' medial:  41.5 LV IVS:        0.99 cm  LV e' lateral:   5.87 cm/s LVOT diam:     2.00 cm  LV E/e' lateral: 25.4 LV SV:         52 LV SV Index:   25 LVOT Area:     3.14 cm  RIGHT VENTRICLE RV Basal diam:  4.07 cm RV S prime:     14.10 cm/s TAPSE (M-mode): 4.6 cm LEFT ATRIUM             Index       RIGHT ATRIUM           Index LA diam:        4.60 cm 2.18 cm/m  RA Area:     21.00 cm LA Vol (A2C):   89.2 ml 42.19 ml/m RA Volume:   60.40 ml  28.57 ml/m LA Vol (A4C):   78.7 ml 37.22 ml/m LA Biplane Vol: 83.9 ml 39.68 ml/m  AORTIC VALVE                    PULMONIC VALVE AV Area (Vmax):    0.94 cm     PV Vmax:        0.89 m/s AV Area (Vmean):  0.96 cm     PV Peak grad:   3.1 mmHg AV Area (VTI):     0.86 cm     RVOT Peak grad: 3 mmHg AV Vmax:           261.00 cm/s AV Vmean:          184.333 cm/s AV VTI:            0.606 m AV Peak Grad:      27.2 mmHg AV Mean Grad:      15.7 mmHg LVOT Vmax:         77.70 cm/s LVOT Vmean:        56.100 cm/s LVOT VTI:          0.166 m LVOT/AV VTI ratio: 0.27  AORTA Ao Root diam: 2.70 cm MITRAL VALVE                TRICUSPID VALVE MV Area (PHT): 5.58 cm     TR Peak grad:   40.7 mmHg MV Decel Time: 136 msec     TR Vmax:        319.00 cm/s MV E velocity: 149.00 cm/s MV A velocity: 127.00 cm/s  SHUNTS MV E/A ratio:  1.17         Systemic VTI:  0.17 m                             Systemic Diam: 2.00 cm Isaias Cowman MD Electronically signed by Isaias Cowman MD Signature Date/Time: 11/14/2020/1:07:34 PM    Final       ASSESSMENT/PLAN   Acute on chronic hypoxemic respiratory  failure Multifactorial  - non massive hemoptysis, OSA/OHS, CHF, atelectasis , ckd with 3rd spaced fluid /anasarca, and protein calorie malnutrition -I dont see signs of infection at this time, will stop antibiotics  --arterial blood gas and repeat cxr -11/07/20  Anasarca   -Most recent transthoracic echo without findings of heart failure  -US liver rule out cirrhosis secondary to NASH -Continue diuresis adding Aldactone with Lasix 40 twice daily and albumin repletion -Meds nephrotoxins   Non-massive hemoptysis  - stopped eliquis   -supportive care    -will consider nebulzed tXA if necessary   Severe bibasilar atelectasis  - IS at bedside  - BID MEtaneb with saline   Bilateral pleural effusions worse on right  - continue diuresis - agree with lasix 80 bid   - decrease infusion of any volume - stopped maxipime   COPD - chornic  - due to AF - stop duonep will start xopenex only   - no need to continue pulmicort  Severe decondintiong   - aggressive PT/OT - contributing to atelectasis and respiratory failure   Moderate OSA on CPAP   - on BIPAP now   - there is skin breakdown over nasal bridge - ordered Gecko gelpad QHS interface.     Chronic atrial fibrillation  - patient is on eluqis which is being held for now, cardiology is on case.  Non-massive hemoptysis is resolving.    Chronic kidney disease Dc nephrotoxins -continue diuresis    Thank you for allowing me to participate in the care of this patient.  Total face to face encounter time for this patient visit was 40 min. >50% of the time was  spent in counseling and coordination of care.   Patient/Family are satisfied with care plan and all questions have been answered.  This document was prepared  using Systems analyst and may include unintentional dictation errors.     Ottie Glazier, M.D.  Division of [place]

## 2020-11-07 NOTE — Progress Notes (Signed)
Informed Dr. that pt. Is coughing up blood; pt on subq heparin and BiPAP. Waiting for Dr response; will continue to monitor.

## 2020-11-07 NOTE — Progress Notes (Signed)
PT Cancellation Note  Patient Details Name: UNITY LUEPKE MRN: [id number] DOB: 1933/08/02   Cancelled Treatment:    Reason Eval/Treat Not Completed: Medical issues which prohibited therapy (Per chart review pt on bipap with recent hemoptysis s/p subQ heparin. Awaiting response from ED. WIll resume services once medically ready.)   Glenis Musolf C 11/07/2020, 11:42 AM

## 2020-11-08 DIAGNOSIS — J9621 Acute and chronic respiratory failure with hypoxia: Secondary | ICD-10-CM | POA: Diagnosis not present

## 2020-11-08 LAB — CBC WITH DIFFERENTIAL/PLATELET
Abs Immature Granulocytes: 0.08 10*3/uL — ABNORMAL HIGH (ref 0.00–0.07)
Basophils Absolute: 0 10*3/uL (ref 0.0–0.1)
Basophils Relative: 0 %
Eosinophils Absolute: 0 10*3/uL (ref 0.0–0.5)
Eosinophils Relative: 0 %
HCT: 31.1 % — ABNORMAL LOW (ref 36.0–46.0)
Hemoglobin: 9.8 g/dL — ABNORMAL LOW (ref 12.0–15.0)
Immature Granulocytes: 1 %
Lymphocytes Relative: 3 %
Lymphs Abs: 0.3 10*3/uL — ABNORMAL LOW (ref 0.7–4.0)
MCH: 27.8 pg (ref 26.0–34.0)
MCHC: 31.5 g/dL (ref 30.0–36.0)
MCV: 88.1 fL (ref 80.0–100.0)
Monocytes Absolute: 1.2 10*3/uL — ABNORMAL HIGH (ref 0.1–1.0)
Monocytes Relative: 13 %
Neutro Abs: 7.7 10*3/uL (ref 1.7–7.7)
Neutrophils Relative %: 83 %
Platelets: 242 10*3/uL (ref 150–400)
RBC: 3.53 MIL/uL — ABNORMAL LOW (ref 3.87–5.11)
RDW: 13.4 % (ref 11.5–15.5)
WBC: 9.2 10*3/uL (ref 4.0–10.5)
nRBC: 0.5 % — ABNORMAL HIGH (ref 0.0–0.2)

## 2020-11-08 LAB — BASIC METABOLIC PANEL
Anion gap: 14 (ref 5–15)
BUN: 108 mg/dL — ABNORMAL HIGH (ref 8–23)
CO2: 28 mmol/L (ref 22–32)
Calcium: 9.4 mg/dL (ref 8.9–10.3)
Chloride: 100 mmol/L (ref 98–111)
Creatinine, Ser: 1.42 mg/dL — ABNORMAL HIGH (ref 0.44–1.00)
GFR, Estimated: 36 mL/min — ABNORMAL LOW (ref >60)
Glucose, Bld: 249 mg/dL — ABNORMAL HIGH (ref 70–99)
Potassium: 4.3 mmol/L (ref 3.5–5.1)
Sodium: 142 mmol/L (ref 135–145)

## 2020-11-08 LAB — GLUCOSE, CAPILLARY
Glucose-Capillary: 279 mg/dL — ABNORMAL HIGH (ref 70–99)
Glucose-Capillary: 123 mg/dL — ABNORMAL HIGH (ref 70–99)
Glucose-Capillary: 172 mg/dL — ABNORMAL HIGH (ref 70–99)
Glucose-Capillary: 109 mg/dL — ABNORMAL HIGH (ref 70–99)

## 2020-11-08 MED ORDER — INSULIN ASPART PROT & ASPART (70-30 MIX) 100 UNIT/ML ~~LOC~~ SUSP
20.0000 [IU] | Freq: Two times a day (BID) | SUBCUTANEOUS | Status: DC
  Administered 2020-11-08 – 2020-11-11 (×5): 20 [IU] via SUBCUTANEOUS
  Filled 2020-11-08 (×6): qty 10

## 2020-11-08 MED ORDER — METHYLPREDNISOLONE SODIUM SUCC 40 MG IJ SOLR
30.0000 mg | Freq: Every day | INTRAMUSCULAR | Status: DC
  Administered 2020-11-09: 30 mg via INTRAVENOUS
  Filled 2020-11-08: qty 1

## 2020-11-08 NOTE — Progress Notes (Signed)
[facility] NAME: Teresa Krueger    MR#:  [id number]  DATE OF BIRTH:  July 04, 1933  SUBJECTIVE:  patient feels week. She is going to eat when her sister comes to the room. Wants to rest till 3 o'clock and will consider working with PT OT after that  per RN small amount of blood being coughed up. Otherwise hemodynamically stable  REVIEW OF SYSTEMS:   Review of Systems  Constitutional: Negative for chills, fever and weight loss.  HENT: Negative for ear discharge, ear pain and nosebleeds.   Eyes: Negative for blurred vision, pain and discharge.  Respiratory: Negative for sputum production, shortness of breath, wheezing and stridor.   Cardiovascular: Negative for chest pain, palpitations, orthopnea and PND.  Gastrointestinal: Negative for abdominal pain, diarrhea, nausea and vomiting.  Genitourinary: Negative for frequency and urgency.  Musculoskeletal: Negative for back pain and joint pain.  Neurological: Negative for sensory change, speech change, focal weakness and weakness.  Psychiatric/Behavioral: Negative for depression and hallucinations. The patient is not nervous/anxious.    Tolerating Diet: Tolerating PT:   DRUG ALLERGIES:   Allergies  Allergen Reactions  . Aspirin Swelling and Other (See Comments)    Pt states that she has tongue swelling.    . Celebrex [Celecoxib] Anaphylaxis  . Meloxicam Swelling  . Nsaids Anaphylaxis, Other (See Comments) and Nausea And Vomiting    unknown   . Quetiapine Anaphylaxis    intolerant  . Rofecoxib Anaphylaxis  . Sulfa Antibiotics Hives, Itching, Anaphylaxis, Nausea And Vomiting and Other (See Comments)  . Tolmetin Anaphylaxis  . Bactrim [Sulfamethoxazole-Trimethoprim] Hives and Itching  . Biguanide Copolymer Other (See Comments)    Reaction:  Unknown   . Buprenorphine Hcl Other (See Comments)    Reaction:  Unknown   . Cetirizine Other (See Comments)  . Ciprofloxacin Other (See  Comments)    Reaction:  Unknown   . Codeine Nausea And Vomiting and Other (See Comments)    Reaction:  Syncope   . Dextromethorphan Hbr Other (See Comments)    Reaction:  GI upset   . Dextromethorphan Polistirex Er Other (See Comments)    Reaction:  GI upset  GI upset  . Furosemide Other (See Comments)    Reaction:  Unknown   . Hydrocodone Other (See Comments)    Reaction:  Unknown   . Metformin Other (See Comments)    Reaction:  Unknown   . Morphine And Related Other (See Comments)    Reaction:  Unknown   . Penicillins Swelling and Other (See Comments)    Pt did not answer the additional questions for this medication because she does not remember.    . Pregabalin Other (See Comments)    Reaction:  Unknown   . Promethazine Other (See Comments)    Reaction:  Unknown   . Propoxyphene Other (See Comments)    Reaction:  Unknown   . Propoxyphene Other (See Comments)  . Quinolones Other (See Comments)    Reaction:  Unknown   . Seroquel [Quetiapine Fumarate] Other (See Comments)    Reaction:  Unknown   . Serotonin Reuptake Inhibitors (Ssris) Other (See Comments)    Reaction:  Unknown   . Tramadol Nausea Only  . Ultracet [Tramadol-Acetaminophen] Other (See Comments)    Reaction:  Unknown     VITALS:  Blood pressure (!) 142/53, pulse 80, temperature 97.7 F (36.5 C), temperature source Oral, resp. rate 19, height 4\' 11"  (1.499 m), weight 117.5  kg, SpO2 93 %.  PHYSICAL EXAMINATION:   Physical Exam  GENERAL:  87 y.o.-year-old patient lying in the bed with no acute distress. morbid obesity LUNGS: Normal breath sounds bilaterally, no wheezing, rales, rhonchi. No use of accessory muscles of respiration.  CARDIOVASCULAR: S1, S2 normal. No murmurs, rubs, or gallops.  ABDOMEN: Soft, nontender, nondistended. Bowel sounds present. No organomegaly or mass.  EXTREMITIES: chronic edema b/l.    NEUROLOGIC: Cranial nerves II through XII are intact. No focal Motor or sensory deficits b/l.   Very deconditoned PSYCHIATRIC:  patient is alert and oriented x 3.  SKIN: No obvious rash, lesion, or ulcer.  LABORATORY PANEL:  CBC Recent Labs  Lab 11/08/20 0547  WBC 9.2  HGB 9.8*  HCT 31.1*  PLT 242    Chemistries  Recent Labs  Lab 11/07/20 0554 11/08/20 0547  NA 140 142  K 4.2 4.3  CL 99 100  CO2 28 28  GLUCOSE 233* 249*  BUN 116* 108*  CREATININE 1.86* 1.42*  CALCIUM 9.4 9.4  AST 28  --   ALT 18  --   ALKPHOS 80  --   BILITOT 1.0  --    Cardiac Enzymes No results for input(s): TROPONINI in the last 168 hours. RADIOLOGY:  DG Chest Port 1 View  Result Date: 11/07/2020 CLINICAL DATA:  Shortness of breath EXAM: PORTABLE CHEST 1 VIEW COMPARISON:  11/03/2020, CT chest 11/04/2020 FINDINGS: Right-sided pacing device as before. Small bilateral pleural effusions. Cardiomegaly with extensive bilateral consolidations and ground-glass opacities, worse as compared with 513. Aortic atherosclerosis. No pneumothorax IMPRESSION: Cardiomegaly with widespread consolidations and ground-glass opacities, worse as compared with 11/03/2020. Small pleural effusions. Electronically Signed   By: Donavan Foil M.D.   On: 11/07/2020 17:03   ASSESSMENT AND PLAN:   87/F morbidly obese, chronically ill with multiple medical problems namely CAD, chronic diastolic CHF, COPD, chronic lymphedema, CKD stage III AAA, type 2 diabetes mellitus, hypertension  presented to the emergency room with worsening dyspnea on exertion over the last 1 week, intermittent chest pain, worsening lower extremity edema and productive cough. she was noted to be hypoxic with evidence of fluid overload, CTA chest was negative for PE, subsequently started on BiPAP and IV Lasix.  Acute on chronic hypoxic respiratory failure secondary to acute on chronic diastolic CHF/COPD morbid obesity with sleep apnea -- requires BiPAP nightly and intermittently -- CTA on admission consistent with fluid overload. No PE -- continue IV Lasix  for 8.4 Liters negative -- IV steroids switch to oral -- incentive spirometer, duo nebs -- patient needs constant motivation to get out of bed -- PT OT to see patient -- appreciate pulmonary input  Mild hemoptysis -- hemoglobin stable -- Plavix on hold  AK I on CKD stage IIIa -- baseline creatinine 1.1 -- creatinine has been ranging 1.4 to 1.6 -- Cozaar held  Chest pain elevated troponin with history of CAD -- suspect demand ischemia -- followed by Dr. Ubaldo Glassing-- ischemic eval defer to the setting of renal insufficiency -- continue statins  History of TIA -- Plavix on hold  Type II diabetes with CKD stage IIIa -- continue 7030 insulin and sliding scale  Gerd PPI  Chronic lymphedema/obesity  Multiple comorbidities. PT OT to see patient. Slow to improve. Patient not much motivated to move around   Procedures: Family communication :none today Consults : pulmonary, cardiology CODE STATUS: DNR DVT Prophylaxis : Level of care: Progressive Cardiac Status is: Inpatient  Remains inpatient appropriate because:Inpatient level of care  appropriate due to severity of illness   Dispo: The patient is from: Home              Anticipated d/c is to: SNF              Patient currently is not medically stable to d/c.   Difficult to place patient No        TOTAL TIME TAKING CARE OF THIS PATIENT: 30 minutes.  >50% time spent on counselling and coordination of care  Note: This dictation was prepared with Dragon dictation along with smaller phrase technology. Any transcriptional errors that result from this process are unintentional.  Fritzi Mandes M.D    [facility]   CC: Primary care physician; Adin Hector, MDPatient ID: Teresa Krueger, female   DOB: 1934-03-03, 87 y.o.   MRN: [id number]

## 2020-11-08 NOTE — Progress Notes (Signed)
Physical Therapy Treatment Patient Details Name: Teresa Krueger MRN: [id number] DOB: 14-Sep-1933 Today's Date: 11/08/2020    History of Present Illness Teresa Krueger is an 87 y/o F admitted on 11/06/2020 with c/c of SOB over the last week, pain on L side of neck & back, & BLE edema. Pt being treated for acute decompensated diastolic CHF & hypoxic respiratory failure. PMH: CAD, chronic lymphadema, COPD, CKD stage 3, HLD, HTN, DM, morbid obesity. Cardiac cath on 5/10 canceled due to concerns of kidney function.    PT Comments    Pt in bed upon entry, quite somnolent, delayed in response, speech slow and soft. Sister at bedside, able to provide some narrative regarding last few days and the patient's mobility, tolerance, etc. Pt assisted with AA/ROM in bed to help with joint stiffness prior to mobilization to EOB. Pt falls asleep frequently during session, requires stimulation constantly and cues to keep eyes open. Pt mouth breathing first 15 minutes of session despite cues, 85% SpO2 on 5L/min [place], pt anxious about using [place] in mouth as recommended by author, but agreeable to 2nd [place] in mouth which does improve sats to mid 90s%. Pt also breathing very shallow. ModA to EOB, pt actually moving better than anticipate but still obviously very weak and wiped out. Seated, SpO2 drops to 85% for a few minutes, eventually author increases both oral and nasal flow rates as delivery source is fluctuating and inconsistent. Pt dizzy at EOB for a few minutes. OT enters, is asked to trade-off to maximize pt's limited activity tolerance be able to participate in both services today. RN updated at end of session regarding O2, RN also reminded of importance of pt getting OOB to chair daily, or at least to EOB for meals. RN reports a busy day has interfered with this first day having this patient. DC to home no longer remains a safe recommendation at this time, pt has not AMB but a couple feet 1x since admitted 9 days prior. She  remains severely weak, deconditioned, requires heavy physical support and lacks adequate assist at home at this time.    Follow Up Recommendations  Supervision for mobility/OOB;SNF;Supervision - Intermittent     Equipment Recommendations  None recommended by PT    Recommendations for Other Services       Precautions / Restrictions Precautions Precautions: Fall Restrictions Weight Bearing Restrictions: No    Mobility  Bed Mobility Overal bed mobility: Needs Assistance Bed Mobility: Supine to Sit Rolling: Mod assist         General bed mobility comments: does well with hand held assist to pull self to EOB, min-modA for full upright sitting; BUE HHA to pul self to EOB, then exhausted, collapses hhead on authors chest. SpO2: droppign to mid 80s, poro recovery over 3 minutes, until flow rate increased to 6L [place], 6L oral canual.    Transfers                    Ambulation/Gait                 Stairs             Wheelchair Mobility    Modified Rankin (Stroke Patients Only)       Balance  Cognition Arousal/Alertness: Awake/alert Behavior During Therapy: WFL for tasks assessed/performed Overall Cognitive Status: Within Functional Limits for tasks assessed                                        Exercises General Exercises - Lower Extremity Ankle Circles/Pumps: AROM;15 reps;Supine;Limitations Ankle Circles/Pumps Limitations: eyes closed, falling asleep, mouth breathing, all of which require frequent cues and stimulation for correction. Short Arc Quad: AROM;15 reps;Both Heel Slides: AAROM;Both;15 reps;Limitations Heel Slides Limitations: severely limited on left as per baseline Other Exercises Other Exercises: Left hand to top of head x15; Right elbow flexion (hand to mouth) x15    General Comments        Pertinent Vitals/Pain Pain Assessment: 0-10 Pain Score: 7   Pain Location: Rt chest pain, improved when coughing up clots per sister Pain Descriptors / Indicators: Sore Pain Intervention(s): Limited activity within patient's tolerance;Monitored during session;Premedicated before session    Home Living                      Prior Function            PT Goals (current goals can now be found in the care plan section) Acute Rehab PT Goals Patient Stated Goal: to go home and be able to age in place PT Goal Formulation: With patient Time For Goal Achievement: 11/13/20 Potential to Achieve Goals: Good Progress towards PT goals: Not progressing toward goals - comment    Frequency    Min 2X/week      PT Plan Current plan remains appropriate    Co-evaluation              AM-PAC PT "6 Clicks" Mobility   Outcome Measure  Help needed turning from your back to your side while in a flat bed without using bedrails?: Total Help needed moving from lying on your back to sitting on the side of a flat bed without using bedrails?: A Lot Help needed moving to and from a bed to a chair (including a wheelchair)?: Total Help needed standing up from a chair using your arms (e.g., wheelchair or bedside chair)?: Total Help needed to walk in hospital room?: Total Help needed climbing 3-5 steps with a railing? : Total 6 Click Score: 7    End of Session Equipment Utilized During Treatment: Oxygen Activity Tolerance: Patient limited by fatigue;Patient limited by lethargy;Patient limited by pain;Patient tolerated treatment well Patient left: with call bell/phone within reach;in bed;with nursing/sitter in room;with family/visitor present Nurse Communication: Mobility status (Mouth breathing, O2 in mouth) PT Visit Diagnosis: Difficulty in walking, not elsewhere classified (R26.2);Muscle weakness (generalized) (M62.81)     Time: 8786-7672 PT Time Calculation (min) (ACUTE ONLY): 30 min  Charges:  $Therapeutic Exercise: 23-37 mins                      3:52 PM, 11/08/20 Etta Grandchild, PT, DPT Physical Therapist - [facility]  [phone] (Ken [place])     [place] 11/08/2020, 3:46 PM

## 2020-11-08 NOTE — Progress Notes (Signed)
Occupational Therapy Treatment Patient Details Name: VIRGIL SLINGER MRN: [id number] DOB: Jun 22, 1934 Today's Date: 11/08/2020    History of present illness Mersades Barbaro is an 87 y/o F admitted on 11/10/2020 with c/c of SOB over the last week, pain on L side of neck & back, & BLE edema. Pt being treated for acute decompensated diastolic CHF & hypoxic respiratory failure. PMH: CAD, chronic lymphadema, COPD, CKD stage 3, HLD, HTN, DM, morbid obesity. Cardiac cath on 5/10 canceled due to concerns of kidney function.   OT comments  Pt seen for OT tx this date to f/u re: safety with ADLs/ADL mobility. PT in room with pt when OT presents. Pt in EOB sitting and required second nasal cannula to more effectively deliver O2 through oral route as pt with difficulty taking nose breaths with exertional tasks such as position changes. Pt's sats noted to improve and stay stable with this addition and RN notified (see below fore more detail). Pt requires MOD A for SPS from bed to chair with RW for UE support and is noted to have BM on her bottom and sheets. Once to the chair. Pt states she feels she needs to do more but declines to pivot to commode and instead wants bed pan placed in seat. OT obliges and monitors pt's skin. Pt then requires MAX A +2 for LB peri care and bathing with 2 STS trials. Pt requires MOD A for UB bathing in sitting and MIN A For UB dressing with cues to sequence and contribute meaningfully to tasks throughout in an effort to preserve her strength and INDEP. Pt left in chair with all needs met and in reach. RN and CNA notified of pt status at end of session. Pt's sister present throughout. Will continue to follow acutely. Updated pt d/c recommendation to STR in SNF setting as she has progressively become more deconditioned throughout hospital stay, especially with her O2 needs limiting her tolerance for OOB activity.    Follow Up Recommendations  SNF    Equipment Recommendations  None  recommended by OT    Recommendations for Other Services      Precautions / Restrictions Precautions Precautions: Fall Restrictions Weight Bearing Restrictions: No       Mobility Bed Mobility Overal bed mobility: Needs Assistance Bed Mobility: Supine to Sit Rolling: Mod assist         General bed mobility comments: pt up to side of bed with PT when OT presents.    Transfers Overall transfer level: Needs assistance Equipment used: Rolling walker (2 wheeled) Transfers: Sit to/from [facility] Sit to Stand: Mod assist;From elevated surface Stand pivot transfers: Mod assist;From elevated surface       General transfer comment: MOD A to SPS frmo EOB To chair with RW for UE support.    Balance Overall balance assessment: Needs assistance Sitting-balance support: Feet supported;No upper extremity supported Sitting balance-Leahy Scale: Fair Sitting balance - Comments: increased UE support required as opposed to previous sessions with OT d/t increased time in bed over course of ~4-5 days with increased respiratory needs.   Standing balance support: Bilateral upper extremity supported Standing balance-Leahy Scale: Fair Standing balance comment: very reliant on UE support and flexes at hips to assume tripod standing postioned, leaned on front of wallker occasionally, requiring cues to attempt to extend standing posture to improve surface area for breathing in standing.  ADL either performed or assessed with clinical judgement   ADL Overall ADL's : Needs assistance/impaired     Grooming: Brushing hair;Moderate assistance;Sitting Grooming Details (indicate cue type and reason): seated EOB with MOD A to reach back of head and d/t generally poor tolerance for high reaching for extended period of time. Upper Body Bathing: Moderate assistance;Sitting Upper Body Bathing Details (indicate cue type and reason): sitting in  chair Lower Body Bathing: Maximal assistance;+2 for physical assistance;+2 for safety/equipment;Sit to/from stand Lower Body Bathing Details (indicate cue type and reason): STS from chair with RW with MAX A For peri care as well as pt's sister helping to support her through blocking her feet from sliding and steading walker. Upper Body Dressing : Minimal assistance;Moderate assistance;Sitting Upper Body Dressing Details (indicate cue type and reason): requires increased assist this date, decreased toelrance for raising arms for UB dressing d/t taxing nature of activity. Lower Body Dressing: Maximal assistance;Total assistance Lower Body Dressing Details (indicate cue type and reason): to don socks Toilet Transfer: Moderate assistance;RW Toilet Transfer Details (indicate cue type and reason): pt requests bep pain be placed under her while sitting in recliner versus pivoting to commode citing fatique. Pt requires MOD A to STS from chair in order for pan to be placed. Toileting- Clothing Manipulation and Hygiene: Maximal assistance;Total assistance;Sit to/from stand               Vision Baseline Vision/History: Wears glasses Wears Glasses: At all times Patient Visual Report: No change from baseline     Perception     Praxis      Cognition Arousal/Alertness: Awake/alert Behavior During Therapy: WFL for tasks assessed/performed Overall Cognitive Status: Within Functional Limits for tasks assessed                                 General Comments: some decreased verbal communication versus previous sessions and generally more drowsy/less interactive with therapist, but still appropriate and able to follow all commands.        Exercises General Exercises - Lower Extremity Ankle Circles/Pumps: AROM;15 reps;Supine;Limitations Ankle Circles/Pumps Limitations: eyes closed, falling asleep, mouth breathing, all of which require frequent cues and stimulation for  correction. Short Arc Quad: AROM;15 reps;Both Heel Slides: AAROM;Both;15 reps;Limitations Heel Slides Limitations: severely limited on left as per baseline Other Exercises Other Exercises: OT engages pt in seated bathing/dressing and toileting tasks in chair using STS method with RW x2 to perform thorough peri care with MAX/TOTAL A. Other Exercises: OT engages pt in seated dyanmic balance activities including lateral lean/elbow taps 5 per side and engages pt in postural extension with scap retraction to improve core control for balance as well as general core strength for expulsion of phlegm Other Exercises: Left hand to top of head x15; Right elbow flexion (hand to mouth) x15   Shoulder Instructions       General Comments Pt on 6Lnc, PT gave pt second nasal cannula as pt noted to mouth breathe with exertion and second O2 source used in pt's mouth also on 6Lnc. Pt O2 gradaully improved throughout session to ultimately maintaining at ~94% and above. Pt only de-sat to 90% with fxl transfers. But when only using nasal cannula just prior to OT arriving, PT reports that pt de-sats to low 80s.    Pertinent Vitals/ Pain       Pain Assessment: 0-10 Pain Score: 5  Pain Location: some pain when coughing Pain  Descriptors / Indicators: Sore Pain Intervention(s): Limited activity within patient's tolerance;Monitored during session;Repositioned  Home Living                                          Prior Functioning/Environment              Frequency  Min 2X/week        Progress Toward Goals  OT Goals(current goals can now be found in the care plan section)  Progress towards OT goals: OT to reassess next treatment (pt limited by oxygenation status, although she did tolerate OT tx better today than most recent tx, she also is still limited versus data collected at time of evaluation.)  Acute Rehab OT Goals Patient Stated Goal: to go home and be able to age in place OT  Goal Formulation: With patient Time For Goal Achievement: 11/12/20 Potential to Achieve Goals: [place] Frequency needs to be updated;Discharge plan needs to be updated    Co-evaluation                 AM-PAC OT "6 Clicks" Daily Activity     Outcome Measure   Help from another person eating meals?: A Little Help from another person taking care of personal grooming?: A Lot Help from another person toileting, which includes using toliet, bedpan, or urinal?: A Lot Help from another person bathing (including washing, rinsing, drying)?: A Lot Help from another person to put on and taking off regular upper body clothing?: A Lot Help from another person to put on and taking off regular lower body clothing?: A Lot 6 Click Score: 13    End of Session Equipment Utilized During Treatment: Oxygen;Rolling walker  OT Visit Diagnosis: Unsteadiness on feet (R26.81);Muscle weakness (generalized) (M62.81);Pain   Activity Tolerance Patient tolerated treatment well;Other (comment) (limited by O2 needs)   Patient Left with call bell/phone within reach;with family/visitor present;with chair alarm set;in chair   Nurse Communication Mobility status;Other (comment) (notified RN and CNA pt left in chair with her sister visitng, pure wick on, all needs met and in reach)        Time: 1536-1650 OT Time Calculation (min): 74 min  Charges: OT Treatments $Self Care/Home Management : 38-52 mins $Therapeutic Activity: 8-22 mins $Therapeutic Exercise: 8-22 mins  Gerrianne Scale, [place], OTR/L ascom [phone] 11/08/20, 6:37 PM

## 2020-11-08 NOTE — Progress Notes (Signed)
Pulmonary Medicine          Date: 11/08/2020,   MRN# [id number] Teresa Krueger 1933/08/13     AdmissionWeight: 117 kg                 CurrentWeight: 117.5 kg   Referring physician: Dr Broadus John   CHIEF COMPLAINT:   Acute on chronic hypoxemic respiratory failure   HISTORY OF PRESENT ILLNESS   Teresa Krueger is a 87 y.o. female with medical history significant of coronary artery disease, chronic lymphedema, COPD, chronic kidney disease stage III, hyperlipidemia, hypertension, diabetes and morbid obesity who was being worked up for angina at home.  Patient was supposed to have a cardiac stress test this week.  She started having chest pressure today shortness of breath.  The shortness of breath has been progressive over the last week.  Currently present even at rest without exertion.  She has pain that is been going on in the left side going to the neck and the back.  It is more pressure that is worse with exertion.  Patient also has noted progressive worsening of leg swelling.  She has cough and frothy sputum.  No fever or chills.  No sick contact.  Patient uses oxygen only at night at about 2 L.  Patient also recently was set up with a Holter monitor which she completed but the results are still pending.  She came to the ER where she was found to be hypoxic evidence of fluid overload as well as anxiety.  Patient progressively got worse in the ER and now is on BiPAP.  She is hypoxic and appears to have evidence of anasarca.  She has been admitted to the hospital for angina with acute on chronic respiratory failure with hypoxia, temperature 98 blood pressure 153/55, pulse 94 respirate of 29 oxygen sat 92% on room air.  Currently 96% on BiPAP.  Hemoglobin is 9.4.  Potassium 5.2.  BUN 63 and creatinine 1.40.  Glucose is 210.  BNP is 593.  Initial troponin 24-second troponin 25.  COVID-19 screen is currently pending.  Chest x-ray showed slight left midlung atelectasis.  No edema or  airspace opacity. Despite several days of diuresis her respiratory status is not improved and she continues to have hemoptysis with consult placed for PCCM for additional evaluation from pulmonary perspective.   -11/06/20- Vitals improved, weaning O2.  Patient diuresed >4L thus far.  Reviewed blood work, CBC is reassuring without concerning findings, metabolic profile reviewed with significant hyperglycemia, acute on chronic renal insufficiency in the context of anasarca with CHF and CKD/OHS.  Optimizing diuretic continuing Lasix will change to 40 twice daily adding Aldactone 50 daily.  Blood pressure is good with mild hypertension which will be useful while diuresing.  We will need to concentrate on decreasing additional nephrotoxins while diuresing.  Also will perform US liver to investigate steatohepatitis as additional etiology of recurrent fluid retention with anasarca.   11/07/20-patient still with hemoptysis, but its slowing down.  She is on heparin [place] for dvt ppx, she has terribly swollen ankles with tenderness unable to use SCDs.  She has severe comorbid conditions with complex decision making.  Fluid balance >5L net negative. On 5L O2  11/08/2020-patient reports clinical improvement, we discussed needing to work with physical therapy more and patient states she will attempt, hemoptysis has essentially resolved but she does have intermittent cough of blood streak expectorate.  Patient is -6-1/2 L urine output fluid balance.  She  continues to work with respiratory therapist utilizing [place] device for recruitment maneuvers.  I discussed her care plan with attending physician Dr. Posey Pronto today with short-term plan to increase physical and occupational therapy due to severe deconditioning with bibasilar atelectasis contributing to hypoxemic respiratory failure.  Repeat chest x-ray done 517 shows cardiomegaly with pulmonary edema, there is repeat transthoracic echo.  Patient is with depression post loss of  her husband recently after lung cancer battle.  PAST MEDICAL HISTORY   Past Medical History:  Diagnosis Date  . Arthritis   . Asthma   . Barrett's esophagus   . Breast cancer ([place]) 1999   LT MASTECTOMY  . Bronchiectasis ([place])   . COPD (chronic obstructive pulmonary disease) ([place])   . Diabetes mellitus ([place])   . Hypercholesterolemia   . Hypertension   . Lymphedema   . Mini stroke ([place])   . Osteoporosis   . Pacemaker      SURGICAL HISTORY   Past Surgical History:  Procedure Laterality Date  . CARPAL TUNNEL RELEASE     right  . CATARACT EXTRACTION     bilateral  . GALLBLADDER SURGERY    . MASTECTOMY Left 1999   BREAST CA  . PACEMAKER INSERTION Right 03/21/2015   Procedure: INSERTION PACEMAKER;  Surgeon: Isaias Cowman, MD;  Location: [facility] ORS;  Service: Cardiovascular;  Laterality: Right;  . TOTAL HIP ARTHROPLASTY  2001   left  . TRIGGER FINGER RELEASE     bilateral     FAMILY HISTORY   Family History  Problem Relation Age of Onset  . Heart disease Paternal Grandmother   . Diabetes Paternal Grandmother   . Heart disease Father   . Heart disease Mother   . Heart disease Maternal Grandmother   . Stroke Maternal Aunt   . Breast cancer Paternal Aunt 94  . Lung cancer Brother 2  . Kidney disease Cousin   . Bladder Cancer Neg Hx      SOCIAL HISTORY   Social History   Tobacco Use  . Smoking status: Former Smoker    Packs/day: 1.00    Years: 30.00    Pack years: 30.00    Types: Cigarettes    Quit date: 12/27/1992    Years since quitting: 27.8  . Smokeless tobacco: Never Used  Substance Use Topics  . Alcohol use: No  . Drug use: No     MEDICATIONS    Home Medication:    Current Medication:  Current Facility-Administered Medications:  .  0.9 %  sodium chloride infusion, 250 mL, Intravenous, PRN, Teodoro Spray, MD .  ALPRAZolam Duanne Moron) tablet 0.5 mg, 0.5 mg, Oral, BID, 0.5 mg at 11/08/20 0917 **AND** ALPRAZolam Duanne Moron) tablet 0.5 mg, 0.5  mg, Oral, Daily PRN, Renda Rolls, RPH, 0.5 mg at 11/06/20 2043 .  calcium carbonate (TUMS - dosed in mg elemental calcium) chewable tablet 200 mg of elemental calcium, 1 tablet, Oral, PRN, Jonelle Sidle, Mohammad L, MD, 200 mg of elemental calcium at 11/02/20 0928 .  capsaicin (ZOSTRIX) 0.025 % cream, , Topical, BID, Sidney Ace, MD, Given at 11/07/20 2131 .  chlorhexidine (PERIDEX) 0.12 % solution 15 mL, 15 mL, Mouth Rinse, BID, Jonelle Sidle, Mohammad L, MD, 15 mL at 11/08/20 0916 .  Chlorhexidine Gluconate Cloth 2 % PADS 6 each, 6 each, Topical, Q0600, Elwyn Reach, MD, 6 each at 11/07/20 0521 .  furosemide (LASIX) injection 40 mg, 40 mg, Intravenous, BID, Ottie Glazier, MD, 40 mg at 11/08/20 0917 .  insulin  aspart (novoLOG) injection 0-9 Units, 0-9 Units, Subcutaneous, TID WC, Domenic Polite, MD, 1 Units at 11/08/20 1301 .  insulin aspart protamine- aspart (NOVOLOG MIX 70/30) injection 18 Units, 18 Units, Subcutaneous, BID WC, Domenic Polite, MD, 18 Units at 11/08/20 [phone]  levalbuterol (XOPENEX) nebulizer solution 0.63 mg, 0.63 mg, Nebulization, Q6H PRN, Ottie Glazier, MD, 0.63 mg at 11/08/20 1034 .  lidocaine (LIDODERM) 5 % 1 patch, 1 patch, Transdermal, Q24H, Sreenath, Sudheer B, MD, 1 patch at 10/24/2020 0824 .  magnesium hydroxide (MILK OF MAGNESIA) suspension 30 mL, 30 mL, Oral, QHS PRN, Jonelle Sidle, Mohammad L, MD .  MEDLINE mouth rinse, 15 mL, Mouth Rinse, q12n4p, Garba, Mohammad L, MD, 15 mL at 11/07/20 1745 .  methocarbamol (ROBAXIN) tablet 500 mg, 500 mg, Oral, Q8H PRN, Priscella Mann, Sudheer B, MD, 500 mg at 10/25/2020 0821 .  methylPREDNISolone sodium succinate (SOLU-MEDROL) 40 mg/mL injection 40 mg, 40 mg, Intravenous, Daily, Lanney Gins, Other Atienza, MD, 40 mg at 11/08/20 0917 .  multivitamin with minerals tablet 1 tablet, 1 tablet, Oral, QODAY, Elwyn Reach, MD, 1 tablet at 11/07/20 1002 .  mupirocin ointment (BACTROBAN) 2 %, , Topical, BID, Elwyn Reach, MD, Given at 11/08/20 [phone] .   ondansetron (ZOFRAN) injection 4 mg, 4 mg, Intravenous, Q6H PRN, Garba, Mohammad L, MD .  pantoprazole (PROTONIX) EC tablet 40 mg, 40 mg, Oral, Daily, Gala Romney L, MD, 40 mg at 11/08/20 0917 .  simethicone (MYLICON) chewable tablet 80 mg, 80 mg, Oral, QID PRN, Ralene Muskrat B, MD, 80 mg at 10/31/20 1744 .  simvastatin (ZOCOR) tablet 20 mg, 20 mg, Oral, q1600, Jonelle Sidle, Mohammad L, MD, 20 mg at 11/07/20 1742 .  sodium chloride (OCEAN) 0.65 % nasal spray 1 spray, 1 spray, Each Nare, PRN, Domenic Polite, MD .  sodium chloride flush (NS) 0.9 % injection 3 mL, 3 mL, Intravenous, Q12H, Teodoro Spray, MD, 3 mL at 11/08/20 0920 .  sodium chloride flush (NS) 0.9 % injection 3 mL, 3 mL, Intravenous, PRN, Teodoro Spray, MD .  spironolactone (ALDACTONE) tablet 50 mg, 50 mg, Oral, Daily, Ottie Glazier, MD, 50 mg at 11/08/20 0917 .  triamcinolone cream (KENALOG) 0.1 % cream, , Topical, BID, Elwyn Reach, MD, Given at 11/08/20 0919    ALLERGIES   Aspirin, Celebrex [celecoxib], Meloxicam, Nsaids, Quetiapine, Rofecoxib, Sulfa antibiotics, Tolmetin, Bactrim [sulfamethoxazole-trimethoprim], Biguanide copolymer, Buprenorphine hcl, Cetirizine, Ciprofloxacin, Codeine, Dextromethorphan hbr, Dextromethorphan polistirex er, Furosemide, Hydrocodone, Metformin, Morphine and related, Penicillins, Pregabalin, Promethazine, Propoxyphene, Propoxyphene, Quinolones, Seroquel [quetiapine fumarate], Serotonin reuptake inhibitors (ssris), Tramadol, and Ultracet [tramadol-acetaminophen]     REVIEW OF SYSTEMS    Review of Systems:  Gen:  Denies  fever, sweats, chills weigh loss  HEENT: Denies blurred vision, double vision, ear pain, eye pain, hearing loss, nose bleeds, sore throat Cardiac:  No dizziness, chest pain or heaviness, chest tightness,edema Resp:   Denies cough or sputum porduction, shortness of breath,wheezing, hemoptysis,  Gi: Denies swallowing difficulty, stomach pain, nausea or vomiting,  diarrhea, constipation, bowel incontinence Gu:  Denies bladder incontinence, burning urine Ext:   Denies Joint pain, stiffness or swelling Skin: Denies  skin rash, easy bruising or bleeding or hives Endoc:  Denies polyuria, polydipsia , polyphagia or weight change Psych:   Denies depression, insomnia or hallucinations   Other:  All other systems negative   VS: BP (!) 142/53 (BP Location: Right Arm)   Pulse 80   Temp 97.7 F (36.5 C) (Oral)   Resp 19   Ht 4\' 11"  (  1.499 m)   Wt 117.5 kg   SpO2 93%   BMI 52.33 kg/m      PHYSICAL EXAM    GENERAL:NAD, no fevers, chills, no weakness no fatigue HEAD: Normocephalic, atraumatic.  EYES: Pupils equal, round, reactive to light. Extraocular muscles intact. No scleral icterus.  MOUTH: Moist mucosal membrane. Dentition intact. No abscess noted.  EAR, NOSE, THROAT: Clear without exudates. No external lesions.  NECK: Supple. No thyromegaly. No nodules. No JVD.  PULMONARY: rhonchi bilaterally.  CARDIOVASCULAR: S1 and S2. Regular rate and rhythm. No murmurs, rubs, or gallops. No edema. Pedal pulses 2+ bilaterally.  GASTROINTESTINAL: Soft, nontender, nondistended. No masses. Positive bowel sounds. No hepatosplenomegaly.  MUSCULOSKELETAL: No swelling, clubbing, or edema. Range of motion full in all extremities.  NEUROLOGIC: Cranial nerves II through XII are intact. No gross focal neurological deficits. Sensation intact. Reflexes intact.  SKIN: No ulceration, lesions, rashes, or cyanosis. Skin warm and dry. Turgor intact.  PSYCHIATRIC: Mood, affect within normal limits. The patient is awake, alert and oriented x 3. Insight, judgment intact.       IMAGING    DG Chest 2 View  Result Date: 11/07/2020 CLINICAL DATA:  Shortness of breath EXAM: CHEST - 2 VIEW COMPARISON:  December 07, 2018. FINDINGS: There is no edema or airspace opacity. There is slight atelectasis in the left mid lung. Heart is upper normal in size with pacemaker leads attached to  the right atrium and right ventricle. No adenopathy. There is aortic atherosclerosis. There is degenerative change in the thoracic spine. IMPRESSION: Slight left midlung atelectasis. No edema or airspace opacity. Heart upper normal in size with pacemaker leads attached to right atrium and right ventricle. Aortic Atherosclerosis (ICD10-I70.0). Electronically Signed   By: Lowella Grip III M.D.   On: 11/16/2020 17:08   CT CHEST WO CONTRAST  Result Date: 11/04/2020 CLINICAL DATA:  Respiratory failure, worsening hypoxia, CHF, concern for infectious process EXAM: CT CHEST WITHOUT CONTRAST TECHNIQUE: Multidetector CT imaging of the chest was performed following the standard protocol without IV contrast. COMPARISON:  10/28/2020 FINDINGS: Cardiovascular: Aortic atherosclerosis. Right chest multi lead pacer. Cardiomegaly. Left and right coronary artery calcifications. No pericardial effusion. Mediastinum/Nodes: No enlarged mediastinal, hilar, or axillary lymph nodes. Thyroid gland, trachea, and esophagus demonstrate no significant findings. Lungs/Pleura: Small to moderate bilateral pleural effusions and associated atelectasis or consolidation, slightly increased compared to prior examination. Very extensive heterogeneous and ground-glass airspace opacity throughout the lungs, substantially increased compared to prior examination. Mild interlobular septal thickening. Upper Abdomen: No acute abnormality. Musculoskeletal: No chest wall mass or suspicious bone lesions identified. IMPRESSION: 1. Small to moderate bilateral pleural effusions and associated atelectasis or consolidation, slightly increased compared to prior examination. 2. Very extensive heterogeneous and ground-glass airspace opacity throughout the lungs, substantially increased compared to prior examination. Mild interlobular septal thickening. Findings are consistent with worsened multifocal infection, edema, and/or ARDS. 3. Cardiomegaly and coronary  artery disease. Aortic Atherosclerosis (ICD10-I70.0). Electronically Signed   By: Eddie Candle M.D.   On: 11/04/2020 16:32   CT ANGIO CHEST PE W OR WO CONTRAST  Result Date: 10/28/2020 CLINICAL DATA:  87 year old female with concern for pulmonary embolism. EXAM: CT ANGIOGRAPHY CHEST WITH CONTRAST TECHNIQUE: Multidetector CT imaging of the chest was performed using the standard protocol during bolus administration of intravenous contrast. Multiplanar CT image reconstructions and MIPs were obtained to evaluate the vascular anatomy. CONTRAST:  60mL OMNIPAQUE IOHEXOL 350 MG/ML SOLN COMPARISON:  Chest CT dated 02/07/2016. FINDINGS: Cardiovascular: Mild cardiomegaly. No pericardial effusion.  There is coronary vascular calcification and calcification of the mitral annulus. Right pectoral pacemaker device. Advanced atherosclerotic calcification of the thoracic aorta. No aneurysmal dilatation. Evaluation of the pulmonary arteries is limited due to respiratory motion artifact. No large or central pulmonary artery embolus identified. Mediastinum/Nodes: No hilar or mediastinal adenopathy. The esophagus is grossly unremarkable. No mediastinal fluid collection. Lungs/Pleura: There are small bilateral pleural effusions with bibasilar atelectasis. Diffuse interstitial and interlobular septal prominence and patchy bilateral ground-glass opacities consistent with vascular congestion and edema. Scattered ground-glass nodularity may represent edema or pneumonia. Clinical correlation is recommended. No pneumothorax. The central airways are patent. Upper Abdomen: There is a 15 mm hypodense lesion in the right lobe of the liver. Cholecystectomy. Indeterminate subcentimeter left renal hypodense lesion. Musculoskeletal: Osteopenia with scoliosis and degenerative changes of the spine. No acute osseous pathology. Review of the MIP images confirms the above findings. IMPRESSION: 1. No CT evidence of central pulmonary artery embolus. 2. Mild  cardiomegaly with findings of CHF and small bilateral pleural effusions. Scattered ground-glass nodularity may represent edema or pneumonia. Clinical correlation is recommended. 3. Aortic Atherosclerosis (ICD10-I70.0). Electronically Signed   By: Anner Crete M.D.   On: 10/28/2020 01:14   DG Chest Port 1 View  Result Date: 11/07/2020 CLINICAL DATA:  Shortness of breath EXAM: PORTABLE CHEST 1 VIEW COMPARISON:  11/03/2020, CT chest 11/04/2020 FINDINGS: Right-sided pacing device as before. Small bilateral pleural effusions. Cardiomegaly with extensive bilateral consolidations and ground-glass opacities, worse as compared with 513. Aortic atherosclerosis. No pneumothorax IMPRESSION: Cardiomegaly with widespread consolidations and ground-glass opacities, worse as compared with 11/03/2020. Small pleural effusions. Electronically Signed   By: Donavan Foil M.D.   On: 11/07/2020 17:03   DG Chest Port 1 View  Result Date: 11/03/2020 CLINICAL DATA:  Hypoxia. EXAM: PORTABLE CHEST 1 VIEW COMPARISON:  Radiographs 11/07/2020 and 11/15/2020.  CT 10/28/2020. FINDINGS: 0934 hours. Right subclavian pacemaker leads appear unchanged over the right atrium and right ventricle. There is stable mild cardiomegaly and aortic atherosclerosis. There are progressive nodular airspace opacities in both lungs with small bilateral pleural effusions. No evidence of pneumothorax. The bones appear unchanged. IMPRESSION: Progressive nodular airspace opacities in both lungs suspicious for multifocal pneumonia, possibly viral in etiology. There may be a component of pulmonary edema as well, and small bilateral pleural effusions are present. Electronically Signed   By: Richardean Sale M.D.   On: 11/03/2020 10:04   DG Chest Port 1 View  Result Date: 11/14/2020 CLINICAL DATA:  87 year old female with wheezing. EXAM: PORTABLE CHEST 1 VIEW COMPARISON:  Chest radiograph dated 10/28/2020. FINDINGS: There is cardiomegaly with vascular congestion  and edema. Pneumonia is not excluded clinical correlation recommended. Small bilateral pleural effusion. No pneumothorax. Atherosclerotic calcification of the aorta. Right pectoral pacemaker device. No acute osseous pathology. IMPRESSION: Cardiomegaly with findings of CHF. Pneumonia is not excluded. Electronically Signed   By: Anner Crete M.D.   On: 10/28/2020 02:26   ECHOCARDIOGRAM COMPLETE  Result Date: 11/15/2020    ECHOCARDIOGRAM REPORT   Patient Name:   Teresa Krueger [facility] Date of Exam: 11/20/2020 Medical Rec #:  [id number]        Height:       59.0 in Accession #:    [id number]       Weight:       275.8 lb Date of Birth:  03-15-1934        BSA:          2.114 m Patient Age:    47 years  BP:           128/75 mmHg Patient Gender: F                HR:           78 bpm. Exam Location:  [facility] Procedure: 2D Echo, Color Doppler and Cardiac Doppler Indications:     CHF- acute diastolic [id number]  History:         Patient has prior history of Echocardiogram examinations, most                  recent 03/17/2015. Pacemaker, COPD; Risk Factors:Hypertension                  and Diabetes.  Sonographer:     Sherrie Sport RDCS (AE) Referring Phys:  [place] Diagnosing Phys: Isaias Cowman MD IMPRESSIONS  1. Left ventricular ejection fraction, by estimation, is 55 to 60%. The left ventricle has normal function. The left ventricle has no regional wall motion abnormalities. Left ventricular diastolic parameters were normal.  2. Right ventricular systolic function is normal. The right ventricular size is normal.  3. The mitral valve is normal in structure. Mild to moderate mitral valve regurgitation. No evidence of mitral stenosis.  4. The aortic valve is normal in structure. Aortic valve regurgitation is moderate. No aortic stenosis is present.  5. The inferior vena cava is normal in size with greater than 50% respiratory variability, suggesting right atrial pressure of 3 mmHg. FINDINGS  Left Ventricle: Left  ventricular ejection fraction, by estimation, is 55 to 60%. The left ventricle has normal function. The left ventricle has no regional wall motion abnormalities. The left ventricular internal cavity size was normal in size. There is  no left ventricular hypertrophy. Left ventricular diastolic parameters were normal. Right Ventricle: The right ventricular size is normal. No increase in right ventricular wall thickness. Right ventricular systolic function is normal. Left Atrium: Left atrial size was normal in size. Right Atrium: Right atrial size was normal in size. Pericardium: There is no evidence of pericardial effusion. Mitral Valve: The mitral valve is normal in structure. Mild to moderate mitral valve regurgitation. No evidence of mitral valve stenosis. Tricuspid Valve: The tricuspid valve is normal in structure. Tricuspid valve regurgitation is mild . No evidence of tricuspid stenosis. Aortic Valve: The aortic valve is normal in structure. Aortic valve regurgitation is moderate. No aortic stenosis is present. Aortic valve mean gradient measures 15.7 mmHg. Aortic valve peak gradient measures 27.2 mmHg. Aortic valve area, by VTI measures  0.86 cm. Pulmonic Valve: The pulmonic valve was normal in structure. Pulmonic valve regurgitation is not visualized. No evidence of pulmonic stenosis. Aorta: The aortic root is normal in size and structure. Venous: The inferior vena cava is normal in size with greater than 50% respiratory variability, suggesting right atrial pressure of 3 mmHg. IAS/Shunts: No atrial level shunt detected by color flow Doppler.  LEFT VENTRICLE PLAX 2D LVIDd:         5.12 cm  Diastology LVIDs:         3.55 cm  LV e' medial:    3.59 cm/s LV PW:         1.40 cm  LV E/e' medial:  41.5 LV IVS:        0.99 cm  LV e' lateral:   5.87 cm/s LVOT diam:     2.00 cm  LV E/e' lateral: 25.4 LV SV:  52 LV SV Index:   25 LVOT Area:     3.14 cm  RIGHT VENTRICLE RV Basal diam:  4.07 cm RV S prime:     14.10  cm/s TAPSE (M-mode): 4.6 cm LEFT ATRIUM             Index       RIGHT ATRIUM           Index LA diam:        4.60 cm 2.18 cm/m  RA Area:     21.00 cm LA Vol (A2C):   89.2 ml 42.19 ml/m RA Volume:   60.40 ml  28.57 ml/m LA Vol (A4C):   78.7 ml 37.22 ml/m LA Biplane Vol: 83.9 ml 39.68 ml/m  AORTIC VALVE                    PULMONIC VALVE AV Area (Vmax):    0.94 cm     PV Vmax:        0.89 m/s AV Area (Vmean):   0.96 cm     PV Peak grad:   3.1 mmHg AV Area (VTI):     0.86 cm     RVOT Peak grad: 3 mmHg AV Vmax:           261.00 cm/s AV Vmean:          184.333 cm/s AV VTI:            0.606 m AV Peak Grad:      27.2 mmHg AV Mean Grad:      15.7 mmHg LVOT Vmax:         77.70 cm/s LVOT Vmean:        56.100 cm/s LVOT VTI:          0.166 m LVOT/AV VTI ratio: 0.27  AORTA Ao Root diam: 2.70 cm MITRAL VALVE                TRICUSPID VALVE MV Area (PHT): 5.58 cm     TR Peak grad:   40.7 mmHg MV Decel Time: 136 msec     TR Vmax:        319.00 cm/s MV E velocity: 149.00 cm/s MV A velocity: 127.00 cm/s  SHUNTS MV E/A ratio:  1.17         Systemic VTI:  0.17 m                             Systemic Diam: 2.00 cm Isaias Cowman MD Electronically signed by Isaias Cowman MD Signature Date/Time: 10/29/2020/1:07:34 PM    Final         ASSESSMENT/PLAN   Acute on chronic hypoxemic respiratory failure Multifactorial  - non massive hemoptysis, OSA/OHS, CHF, atelectasis , ckd with 3rd spaced fluid /anasarca, and protein calorie malnutrition -I dont see signs of infection at this time, will stop antibiotics  --arterial blood gas and repeat cxr -11/07/20 - decrease steroids to 30 soluemdrol once daily   Anasarca   -Most recent transthoracic echo without findings of heart failure  -US liver rule out cirrhosis secondary to NASH -Continue diuresis adding Aldactone with Lasix 40 twice daily and albumin repletion -Meds nephrotoxins to be reduced or dcd    Non-massive hemoptysis  - stopped eliquis   -supportive  care    -will consider nebulzed tXA if necessary   Severe bibasilar atelectasis  - IS at bedside  - BID MEtaneb with saline  Bilateral pleural effusions worse on right  - continue diuresis - agree with lasix 80 bid   - decrease infusion of any volume - stopped maxipime   COPD - chornic  - due to AF - stop duonep will start xopenex only   - no need to continue pulmicort  Severe decondintiong   - aggressive PT/OT - contributing to atelectasis and respiratory failure   Moderate OSA on CPAP   - on BIPAP now   - there is skin breakdown over nasal bridge - ordered Gecko gelpad QHS interface.     Chronic atrial fibrillation  - patient is on eluqis which is being held for now, cardiology is on case.  Non-massive hemoptysis is resolving.    Chronic kidney disease Dc nephrotoxins -continue diuresis    Thank you for allowing me to participate in the care of this patient.  Total face to face encounter time for this patient visit was 40 min. >50% of the time was  spent in counseling and coordination of care.   Patient/Family are satisfied with care plan and all questions have been answered.  This document was prepared using Dragon voice recognition software and may include unintentional dictation errors.     Ottie Glazier, M.D.  Division of [facility]

## 2020-11-08 NOTE — Progress Notes (Signed)
   11/08/20 1000  Clinical Encounter Type  Visited With Patient and family together  Visit Type Initial;Spiritual support;Social support  Referral From Nurse  Consult/Referral To [place] visited PT and her sister while doing rounds. PT's sister was at bedside, and stated her sister needed "much prayer." She said they both believed she would be breathing better by now. Chaplain explored her emotions with her. Pt's sister said she was incredibly pleased with the care her sister was receiving, and the overall morale of the hospital. Chaplain ministered with prayer, and a calming presence.

## 2020-11-09 ENCOUNTER — Encounter: Payer: Medicare Other | Admitting: Occupational Therapy

## 2020-11-09 ENCOUNTER — Inpatient Hospital Stay: Payer: Medicare Other

## 2020-11-09 DIAGNOSIS — J9621 Acute and chronic respiratory failure with hypoxia: Secondary | ICD-10-CM | POA: Diagnosis not present

## 2020-11-09 LAB — GLUCOSE, CAPILLARY
Glucose-Capillary: 154 mg/dL — ABNORMAL HIGH (ref 70–99)
Glucose-Capillary: 152 mg/dL — ABNORMAL HIGH (ref 70–99)
Glucose-Capillary: 231 mg/dL — ABNORMAL HIGH (ref 70–99)
Glucose-Capillary: 282 mg/dL — ABNORMAL HIGH (ref 70–99)

## 2020-11-09 MED ORDER — METHYLPREDNISOLONE SODIUM SUCC 40 MG IJ SOLR
20.0000 mg | Freq: Every day | INTRAMUSCULAR | Status: DC
  Administered 2020-11-10: 20 mg via INTRAVENOUS
  Filled 2020-11-09: qty 1

## 2020-11-09 NOTE — Progress Notes (Signed)
[facility] [place] NAME: Charis Juliana    MR#:  [id number]  DATE OF BIRTH:  [birth year]  SUBJECTIVE:  patient feels weak. Sister Langley Gauss at bedside. Patient is motivated after discussing with her importance with PT, OT and getting out of bed.  REVIEW OF SYSTEMS:   Review of Systems  Constitutional: Negative for chills, fever and weight loss.  HENT: Negative for ear discharge, ear pain and nosebleeds.   Eyes: Negative for blurred vision, pain and discharge.  Respiratory: Negative for sputum production, shortness of breath, wheezing and stridor.   Cardiovascular: Negative for chest pain, palpitations, orthopnea and PND.  Gastrointestinal: Negative for abdominal pain, diarrhea, nausea and vomiting.  Genitourinary: Negative for frequency and urgency.  Musculoskeletal: Negative for back pain and joint pain.  Neurological: Negative for sensory change, speech change, focal weakness and weakness.  Psychiatric/Behavioral: Negative for depression and hallucinations. The patient is not nervous/anxious.    Tolerating Diet:yes Tolerating PT: rehab  DRUG ALLERGIES:   Allergies  Allergen Reactions  . Aspirin Swelling and Other (See Comments)    Pt states that she has tongue swelling.    . Celebrex [Celecoxib] Anaphylaxis  . Meloxicam Swelling  . Nsaids Anaphylaxis, Other (See Comments) and Nausea And Vomiting    unknown   . Quetiapine Anaphylaxis    intolerant  . Rofecoxib Anaphylaxis  . Sulfa Antibiotics Hives, Itching, Anaphylaxis, Nausea And Vomiting and Other (See Comments)  . Tolmetin Anaphylaxis  . Bactrim [Sulfamethoxazole-Trimethoprim] Hives and Itching  . Biguanide Copolymer Other (See Comments)    Reaction:  Unknown   . Buprenorphine Hcl Other (See Comments)    Reaction:  Unknown   . Cetirizine Other (See Comments)  . Ciprofloxacin Other (See Comments)    Reaction:  Unknown   . Codeine Nausea And Vomiting and Other (See  Comments)    Reaction:  Syncope   . Dextromethorphan Hbr Other (See Comments)    Reaction:  GI upset   . Dextromethorphan Polistirex Er Other (See Comments)    Reaction:  GI upset  GI upset  . Furosemide Other (See Comments)    Reaction:  Unknown   . Hydrocodone Other (See Comments)    Reaction:  Unknown   . Metformin Other (See Comments)    Reaction:  Unknown   . Morphine And Related Other (See Comments)    Reaction:  Unknown   . Penicillins Swelling and Other (See Comments)    Pt did not answer the additional questions for this medication because she does not remember.    . Pregabalin Other (See Comments)    Reaction:  Unknown   . Promethazine Other (See Comments)    Reaction:  Unknown   . Propoxyphene Other (See Comments)    Reaction:  Unknown   . Propoxyphene Other (See Comments)  . Quinolones Other (See Comments)    Reaction:  Unknown   . Seroquel [Quetiapine Fumarate] Other (See Comments)    Reaction:  Unknown   . Serotonin Reuptake Inhibitors (Ssris) Other (See Comments)    Reaction:  Unknown   . Tramadol Nausea Only  . Ultracet [Tramadol-Acetaminophen] Other (See Comments)    Reaction:  Unknown     VITALS:  Blood pressure (!) 139/51, pulse 80, temperature 97.7 F (36.5 C), temperature source Oral, resp. rate 20, height 4\' 11"  (1.499 m), weight 117.3 kg, SpO2 94 %.  PHYSICAL EXAMINATION:   Physical Exam  GENERAL:  87 y.o.-year-old patient lying in the  bed with no acute distress. morbid obesity LUNGS: Normal breath sounds bilaterally, no wheezing, rales, rhonchi. No use of accessory muscles of respiration.  CARDIOVASCULAR: S1, S2 normal. No murmurs, rubs, or gallops.  ABDOMEN: Soft, nontender, nondistended. Bowel sounds present. No organomegaly or mass.  EXTREMITIES: chronic edema b/l.    NEUROLOGIC: Cranial nerves II through XII are intact. No focal Motor or sensory deficits b/l.  Very deconditoned PSYCHIATRIC:  patient is alert and oriented x 2.  SKIN: No  obvious rash, lesion, or ulcer.  LABORATORY PANEL:  CBC Recent Labs  Lab 11/08/20 0547  WBC 9.2  HGB 9.8*  HCT 31.1*  PLT 242    Chemistries  Recent Labs  Lab 11/07/20 0554 11/08/20 0547  NA 140 142  K 4.2 4.3  CL 99 100  CO2 28 28  GLUCOSE 233* 249*  BUN 116* 108*  CREATININE 1.86* 1.42*  CALCIUM 9.4 9.4  AST 28  --   ALT 18  --   ALKPHOS 80  --   BILITOT 1.0  --    Cardiac Enzymes No results for input(s): TROPONINI in the last 168 hours. RADIOLOGY:  DG Chest Port 1 View  Result Date: 11/07/2020 CLINICAL DATA:  Shortness of breath EXAM: PORTABLE CHEST 1 VIEW COMPARISON:  11/03/2020, CT chest 11/04/2020 FINDINGS: Right-sided pacing device as before. Small bilateral pleural effusions. Cardiomegaly with extensive bilateral consolidations and ground-glass opacities, worse as compared with 513. Aortic atherosclerosis. No pneumothorax IMPRESSION: Cardiomegaly with widespread consolidations and ground-glass opacities, worse as compared with 11/03/2020. Small pleural effusions. Electronically Signed   By: Donavan Foil M.D.   On: 11/07/2020 17:03   US LIVER DOPPLER  Result Date: 11/09/2020 CLINICAL DATA:  Non alcoholic steatohepatitis EXAM: DUPLEX ULTRASOUND OF LIVER TECHNIQUE: Color and duplex Doppler ultrasound was performed to evaluate the hepatic in-flow and out-flow vessels. Technologist describes technically difficult study secondary to labored breathing, patient on non-rebreather mask. COMPARISON:  CT 09/10/2019 and previous FINDINGS: Liver: Normal parenchymal echogenicity. Normal hepatic contour without nodularity. 2.4 x 1.7 x 1.4 cm simple appearing cyst in the lateral left hepatic segment as before. Main Portal Vein size: 1.2 cm Portal Vein Velocities (all hepatopetal): Main Prox:  48 cm/sec Main Mid: 37 cm/sec Main Dist:  39 cm/sec Right: 21 cm/sec Left: 9 cm/sec Hepatic Vein Velocities (all hepatofugal): Right:  16 cm/sec Middle:  22 cm/sec Left:  25 cm/sec IVC: Present  and patent with normal respiratory phasicity. Velocity 27 cm/seconds. Diameter 1.3 cm. Hepatic Artery Velocity:  58 cm/sec Splenic Vein Velocity:  26 cm/sec Spleen: 9.7 cm x 3.7 cm x 11.5 cm with a total volume of 10/23/2016 cm^3 (411 cm^3 is upper limit normal) Portal Vein Occlusion/Thrombus: No Splenic Vein Occlusion/Thrombus: No Ascites: None Varices: None IMPRESSION: 1. Unremarkable hepatic vascular Doppler evaluation. 2. Stable simple appearing cyst in the lateral left hepatic segment. Electronically Signed   By: Lucrezia Europe M.D.   On: 11/09/2020 11:38   ASSESSMENT AND PLAN:   87/F morbidly obese, chronically ill with multiple medical problems namely CAD, chronic diastolic CHF, COPD, chronic lymphedema, CKD stage III AAA, type 2 diabetes mellitus, hypertension  presented to the emergency room with worsening dyspnea on exertion over the last 1 week, intermittent chest pain, worsening lower extremity edema and productive cough. she was noted to be hypoxic with evidence of fluid overload, CTA chest was negative for PE, subsequently started on BiPAP and IV Lasix.  Acute on chronic hypoxic respiratory failure secondary to acute on chronic diastolic CHF/COPD  morbid obesity with sleep apnea -- requires BiPAP nightly and intermittently -- CTA on admission consistent with fluid overload. No PE -- continue IV Lasix for ~ 10Liters negative -- IV steroids switch to oral taper -- incentive spirometer, duo nebs -- patient needs constant motivation to get out of bed -- PT OT to see patient--recommends Rehab. TOC informed -- appreciate pulmonary input by Dr A  Mild hemoptysis -- hemoglobin stable -- Plavix on hold  AK I on CKD stage IIIa -- baseline creatinine 1.1 -- creatinine has been ranging 1.4 to 1.6 -- Cozaar held  Chest pain elevated troponin with history of CAD -- suspect demand ischemia -- followed by Dr. Ubaldo Glassing-- ischemic eval defer to the setting of renal insufficiency -- continue  statins  History of TIA -- Plavix on hold  Type II diabetes with CKD stage IIIa -- continue 70/30 insulin and sliding scale  Gerd PPI  Chronic lymphedema/obesity  Multiple comorbidities. PT OT input appreciated  Slow to improve. Patient not much motivated to move around--however willing to work with therapy   Procedures: Family communication :Sister Dennise in the room Consults : pulmonary, cardiology CODE STATUS: DNR DVT Prophylaxis : Level of care: Progressive Cardiac Status is: Inpatient  Remains inpatient appropriate because:Inpatient level of care appropriate due to severity of illness   Dispo: The patient is from: Home              Anticipated d/c is to: SNF              Patient currently slowly improving.   Difficult to place patient No  TOC for discharge planning to rehab. Will discharge once bed available.       TOTAL TIME TAKING CARE OF THIS PATIENT: 25 minutes.  >50% time spent on counselling and coordination of care  Note: This dictation was prepared with Dragon dictation along with smaller phrase technology. Any transcriptional errors that result from this process are unintentional.  Fritzi Mandes M.D    [facility]   CC: Primary care physician; Adin Hector, MDPatient ID: [name], female   DOB: 1934-02-10, 87 y.o.   MRN: [id number]

## 2020-11-09 NOTE — Progress Notes (Signed)
PT Cancellation Note  Patient Details Name: Teresa Krueger MRN: [id number] DOB: 08-28-1933   Cancelled Treatment:    Reason Eval/Treat Not Completed: Fatigue/lethargy limiting ability to participate.  Therapist chart reviewed pt and attempted to see pt.  Pt was asleep upon arrival to room.  Pt awakened and requested to be seen at a later time due to fatigue from tests being ran this morning.  PT will attempt at later date/time as medically appropriate.    Gwenlyn Saran, PT, DPT 11/09/20, 1:52 PM

## 2020-11-09 NOTE — Progress Notes (Signed)
Physical Therapy Treatment Patient Details Name: Teresa Krueger MRN: [id number] DOB: Nov 04, 1933 Today's Date: 11/09/2020    History of Present Illness Teresa Krueger is an 87 y/o F admitted on 11/15/2020 with c/c of SOB over the last week, pain on L side of neck & back, & BLE edema. Pt being treated for acute decompensated diastolic CHF & hypoxic respiratory failure. PMH: CAD, chronic lymphadema, COPD, CKD stage 3, HLD, HTN, DM, morbid obesity. Cardiac cath on 5/10 canceled due to concerns of kidney function.    PT Comments    Pt received in Semi-Fowler's position and agreeable to therapy.  Pt was able to perform brief bed-level exercises bilaterally that required additional nasal cannula to be placed in her mouth due to exertion.  Pt was able to perform well.  Pt continues to have difficulty with breathing through nasal cannula due to heavy mouth breathing.  Pt then requested for assistance with transfer to recliner.  Pt noted to not want to use a walker or gait belt, instead requesting support from therapist's arms.  Pt was able to ascend from the bed with good technique however had some difficulty with maneuvering legs to position herself in front of the recliner.  With VC's given, pt able to pivot transfers to chair.  Pt also had difficulty with placing buttocks towards the back of the chair, but with given seated rest break was able to eventually perform.  Nursing in room upon leaving and all needs met.  Nursing requested to not utilize both nasal cannula's per MD request and will be noted going forward.  Current discharge plans to SNF remain appropriate at this time.  Pt will continue to benefit from skilled therapy in order to address deficits listed below.     Follow Up Recommendations  Supervision for mobility/OOB;SNF;Supervision - Intermittent     Equipment Recommendations  None recommended by PT    Recommendations for Other Services       Precautions / Restrictions  Precautions Precautions: Fall Restrictions Weight Bearing Restrictions: No    Mobility  Bed Mobility Overal bed mobility: Needs Assistance Bed Mobility: Supine to Sit Rolling: Mod assist   Supine to sit: Mod assist;HOB elevated     General bed mobility comments: Pt requires hand held modA to come to seated position.    Transfers Overall transfer level: Needs assistance Equipment used: Rolling walker (2 wheeled) Transfers: Sit to/from [facility] Sit to Stand: Mod assist;From elevated surface Stand pivot transfers: Mod assist;From elevated surface       General transfer comment: MOD A to SPS frmo EOB To chair with RW for UE support.  Ambulation/Gait Ambulation/Gait assistance: Min guard   Assistive device: Rolling walker (2 wheeled) Gait Pattern/deviations: Decreased step length - right;Decreased step length - left;Wide base of support;Trunk flexed         Stairs             Wheelchair Mobility    Modified Rankin (Stroke Patients Only)       Balance Overall balance assessment: Needs assistance Sitting-balance support: Feet supported;No upper extremity supported Sitting balance-Leahy Scale: Fair Sitting balance - Comments: Increased UE support required when in standing to remain upright due to posterior lean. Postural control: Posterior lean Standing balance support: Bilateral upper extremity supported Standing balance-Leahy Scale: Poor Standing balance comment: Pt requres assistance from therapist and uses arms heavily to remain upright and to perform transfer.  Cognition Arousal/Alertness: Awake/alert Behavior During Therapy: WFL for tasks assessed/performed Overall Cognitive Status: Within Functional Limits for tasks assessed                                        Exercises Total Joint Exercises Quad Sets: AROM;Supine;5 reps Hip ABduction/ADduction: AROM;Supine;5 reps Long  Arc Quad: AROM;5 reps;Seated    General Comments General comments (skin integrity, edema, etc.): Pt on 6Lnc, PT gave pt second nasal cannula as pt noted to mouth breathe with exertion and second O2 source used in pt's mouth also on 6Lnc. Pt O2 gradaully improved throughout session to ultimately maintaining at ~94% and above. Pt only de-sat to 90% with fxl transfers. Nursing requested for pt not to utilize 2 sets of cannula going forward, but to increase to 7L and then taper back down to 6L.      Pertinent Vitals/Pain Pain Assessment: Faces Faces Pain Scale: Hurts even more Pain Location: some pain when coughing Pain Descriptors / Indicators: Sore Pain Intervention(s): Limited activity within patient's tolerance;Monitored during session;Repositioned    Home Living                      Prior Function            PT Goals (current goals can now be found in the care plan section) Acute Rehab PT Goals Patient Stated Goal: to go home and be able to age in place PT Goal Formulation: With patient Time For Goal Achievement: 11/13/20 Potential to Achieve Goals: Good Progress towards PT goals: Not progressing toward goals - comment    Frequency    Min 2X/week      PT Plan Current plan remains appropriate    Co-evaluation              AM-PAC PT "6 Clicks" Mobility   Outcome Measure  Help needed turning from your back to your side while in a flat bed without using bedrails?: Total Help needed moving from lying on your back to sitting on the side of a flat bed without using bedrails?: A Lot Help needed moving to and from a bed to a chair (including a wheelchair)?: A Lot Help needed standing up from a chair using your arms (e.g., wheelchair or bedside chair)?: A Lot Help needed to walk in hospital room?: Total Help needed climbing 3-5 steps with a railing? : Total 6 Click Score: 9    End of Session Equipment Utilized During Treatment: Oxygen Activity Tolerance:  Patient limited by fatigue;Patient limited by lethargy;Patient limited by pain;Patient tolerated treatment well Patient left: with call bell/phone within reach;in bed;with nursing/sitter in room;with family/visitor present Nurse Communication: Mobility status (Mouth breathing, O2 in mouth) PT Visit Diagnosis: Difficulty in walking, not elsewhere classified (R26.2);Muscle weakness (generalized) (M62.81)     Time: 3212-2482 PT Time Calculation (min) (ACUTE ONLY): 58 min  Charges:  $Therapeutic Exercise: 38-52 mins $Therapeutic Activity: 8-22 mins                     Gwenlyn Saran, PT, DPT 11/09/20, 4:03 PM    Christie Nottingham 11/09/2020, 3:56 PM

## 2020-11-09 NOTE — NC FL2 (Signed)
[place] LEVEL OF CARE SCREENING TOOL     IDENTIFICATION  Patient Name: Teresa Krueger Birthdate: [birth year] Sex: female Admission Date (Current Location): 11-22-2020  [place] and [place] Number:  Engineering geologist and Address:  [facility], [address], [place], [place] [address]      Provider Number: [id number]  Attending Physician Name and Address:  Fritzi Mandes, MD  Relative Name and Phone Number:  Jackelyn Poling (sister) [phone]    Current Level of Care: Hospital Recommended Level of Care: [place] Prior Approval Number:    Date Approved/Denied:   PASRR Number: [id number]  Discharge Plan: SNF    Current Diagnoses: Patient Active Problem List   Diagnosis Date Noted  . Angina at rest [facility]) 2020-11-22  . CHF (congestive heart failure) ([place]) Nov 22, 2020  . Acute on chronic respiratory failure with hypoxia ([place]) 2020-11-22  . Pedal edema 05/02/2020  . Lightheadedness 03/20/2018  . Syncope 03/09/2018  . CKD (chronic kidney disease) stage 3, GFR 30-59 ml/min (HCC) 02/14/2017  . Carotid artery stenosis 10/29/2016  . TIA (transient ischemic attack) 10/14/2016  . Chronic respiratory failure with hypoxia ([place]) 05/24/2016  . Aortic stenosis, mild 05/02/2016  . SOB (shortness of breath) on exertion 05/02/2016  . Swelling of limb 04/10/2016  . Varicose veins of leg with pain, bilateral 04/10/2016  . Lymphedema 04/10/2016  . Osteoarthritis 03/19/2016  . Osteoporosis 03/19/2016  . Coronary artery calcification seen on CAT scan 02/08/2016  . Incidental lung nodule 02/08/2016  . Thoracic aortic atherosclerosis ([place]) 02/08/2016  . S/P placement of cardiac pacemaker 03/30/2015  . Morbid obesity ([place]) 03/17/2015  . Hypersomnia with sleep apnea 03/17/2015  . Mobitz type 2 second degree AV block 03/16/2015  . Multiple pulmonary nodules 09/13/2014  . Lung field abnormal 09/13/2014  . Barrett's esophagus 12/21/2013  .  History of adenomatous polyp of colon 12/21/2013  . Hx of adenomatous colonic polyps 12/21/2013  . Benign essential hypertension 11/30/2013  . Acromioclavicular joint arthritis 11/23/2013  . Glenohumeral arthritis 11/23/2013  . Chest pain 10/21/2013  . Adult body mass index 50.0-59.9 ([place]) 02/09/2013  . Morbid obesity with BMI of 50.0-59.9, adult ([place]) 02/09/2013  . Primary localized osteoarthrosis, lower leg 02/09/2013  . Bronchiectasis ([place]) 05/19/2012  . Chronic bronchitis 12/31/2011  . Cough 12/31/2011  . GERD (gastroesophageal reflux disease) 12/31/2011  . Diabetes mellitus ([place])   . Hypertension     Orientation RESPIRATION BLADDER Height & Weight     Self,Time,Situation,Place  O2 (6L nasal cannula) Continent Weight: 258 lb 9.6 oz (117.3 kg) Height:  4\' 11"  (149.9 cm)  BEHAVIORAL SYMPTOMS/MOOD NEUROLOGICAL BOWEL NUTRITION STATUS      Continent Diet (see discharge summary)  AMBULATORY STATUS COMMUNICATION OF NEEDS Skin   Extensive Assist Verbally Other (Comment) (excoriated right breast and left groin)                       Personal Care Assistance Level of Assistance  Bathing,Feeding,Dressing,Total care Bathing Assistance: Maximum assistance Feeding assistance: Independent Dressing Assistance: Limited assistance Total Care Assistance: Maximum assistance   Functional Limitations Info  Sight,Hearing,Speech Sight Info: Adequate Hearing Info: Adequate Speech Info: Adequate    SPECIAL CARE FACTORS FREQUENCY  PT (By licensed PT),OT (By licensed OT)     PT Frequency: min 3x weekly OT Frequency: min 3x weekly            Contractures Contractures Info: Not present    Additional Factors Info  Code  Status,Allergies Code Status Info: DNR Allergies Info: Aspirin, Celebrex (Celecoxib), Meloxicam, Nsaids, Quetiapine, Rofecoxib, Sulfa Antibiotics, Tolmetin, Bactrim (Sulfamethoxazole-trimethoprim), Biguanide Copolymer, Buprenorphine Hcl, Cetirizine, Ciprofloxacin,  Codeine, Dextromethorphan Hbr, Dextromethorphan Polistirex Er, Furosemide, Hydrocodone, Metformin, Morphine And Related, Penicillins, Pregabalin, Promethazine, Propoxyphene, Propoxyphene, Quinolones, Seroquel (Quetiapine Fumarate), Serotonin Reuptake Inhibitors (Ssris), Tramadol, Ultracet (Tramadol-acetaminophen)           Current Medications (11/09/2020):  This is the current hospital active medication list Current Facility-Administered Medications  Medication Dose Route Frequency Provider Last Rate Last Admin  . 0.9 %  sodium chloride infusion  250 mL Intravenous PRN Teodoro Spray, MD      . ALPRAZolam Duanne Moron) tablet 0.5 mg  0.5 mg Oral BID Renda Rolls, RPH   0.5 mg at 11/09/20 0844   And  . ALPRAZolam Duanne Moron) tablet 0.5 mg  0.5 mg Oral Daily PRN Renda Rolls, RPH   0.5 mg at 11/06/20 2043  . calcium carbonate (TUMS - dosed in mg elemental calcium) chewable tablet 200 mg of elemental calcium  1 tablet Oral PRN Elwyn Reach, MD   200 mg of elemental calcium at 11/02/20 0928  . capsaicin (ZOSTRIX) 0.025 % cream   Topical BID Sidney Ace, MD   Given at 11/07/20 2131  . chlorhexidine (PERIDEX) 0.12 % solution 15 mL  15 mL Mouth Rinse BID Gala Romney L, MD   15 mL at 11/09/20 0856  . Chlorhexidine Gluconate Cloth 2 % PADS 6 each  6 each Topical Q0600 Elwyn Reach, MD   6 each at 11/09/20 0542  . furosemide (LASIX) injection 40 mg  40 mg Intravenous BID Ottie Glazier, MD   40 mg at 11/09/20 0841  . insulin aspart (novoLOG) injection 0-9 Units  0-9 Units Subcutaneous TID WC Domenic Polite, MD   2 Units at 11/09/20 1255  . insulin aspart protamine- aspart (NOVOLOG MIX 70/30) injection 20 Units  20 Units Subcutaneous BID WC Ottie Glazier, MD   20 Units at 11/08/20 1706  . levalbuterol (XOPENEX) nebulizer solution 0.63 mg  0.63 mg Nebulization Q6H PRN Ottie Glazier, MD   0.63 mg at 11/08/20 2123  . lidocaine (LIDODERM) 5 % 1 patch  1 patch Transdermal Q24H Ralene Muskrat B, MD   1 patch at 11-18-2020 0824  . magnesium hydroxide (MILK OF MAGNESIA) suspension 30 mL  30 mL Oral QHS PRN Elwyn Reach, MD      . MEDLINE mouth rinse  15 mL Mouth Rinse q12n4p Gala Romney L, MD   15 mL at 11/07/20 1745  . methocarbamol (ROBAXIN) tablet 500 mg  500 mg Oral Q8H PRN Ralene Muskrat B, MD   500 mg at Nov 18, 2020 0821  . [START ON 11/10/2020] methylPREDNISolone sodium succinate (SOLU-MEDROL) 40 mg/mL injection 20 mg  20 mg Intravenous Daily Ottie Glazier, MD      . multivitamin with minerals tablet 1 tablet  1 tablet Oral Beverly Gust Elwyn Reach, MD   1 tablet at 11/09/20 0844  . mupirocin ointment (BACTROBAN) 2 %   Topical BID Elwyn Reach, MD   Given at 11/08/20 2029  . ondansetron (ZOFRAN) injection 4 mg  4 mg Intravenous Q6H PRN Gala Romney L, MD      . pantoprazole (PROTONIX) EC tablet 40 mg  40 mg Oral Daily Elwyn Reach, MD   40 mg at 11/09/20 0844  . simethicone (MYLICON) chewable tablet 80 mg  80 mg Oral QID PRN Sidney Ace, MD   80  mg at 10/31/20 1744  . simvastatin (ZOCOR) tablet 20 mg  20 mg Oral q1600 Elwyn Reach, MD   20 mg at 11/08/20 1705  . sodium chloride (OCEAN) 0.65 % nasal spray 1 spray  1 spray Each Nare PRN Domenic Polite, MD      . sodium chloride flush (NS) 0.9 % injection 3 mL  3 mL Intravenous Q12H Teodoro Spray, MD   3 mL at 11/09/20 0858  . sodium chloride flush (NS) 0.9 % injection 3 mL  3 mL Intravenous PRN Teodoro Spray, MD      . spironolactone (ALDACTONE) tablet 50 mg  50 mg Oral Daily Ottie Glazier, MD   50 mg at 11/09/20 0845  . triamcinolone cream (KENALOG) 0.1 % cream   Topical BID Elwyn Reach, MD   Given at 11/09/20 7342     Discharge Medications: Please see discharge summary for a list of discharge medications.  Relevant Imaging Results:  Relevant Lab Results:   Additional Information SSN: [id number]  Alberteen Sam, LCSW

## 2020-11-09 NOTE — Progress Notes (Signed)
Pulmonary Medicine          Date: 11/09/2020,   MRN# [id number] CRYSTEL Krueger [birth year]     AdmissionWeight: 117 kg                 CurrentWeight: 117.3 kg   Referring physician: Dr Broadus John   CHIEF COMPLAINT:   Acute on chronic hypoxemic respiratory failure   HISTORY OF PRESENT ILLNESS   Teresa Krueger is a 87 y.o. female with medical history significant of coronary artery disease, chronic lymphedema, COPD, chronic kidney disease stage III, hyperlipidemia, hypertension, diabetes and morbid obesity who was being worked up for angina at home.  Patient was supposed to have a cardiac stress test this week.  She started having chest pressure today shortness of breath.  The shortness of breath has been progressive over the last week.  Currently present even at rest without exertion.  She has pain that is been going on in the left side going to the neck and the back.  It is more pressure that is worse with exertion.  Patient also has noted progressive worsening of leg swelling.  She has cough and frothy sputum.  No fever or chills.  No sick contact.  Patient uses oxygen only at night at about 2 L.  Patient also recently was set up with a Holter monitor which she completed but the results are still pending.  She came to the ER where she was found to be hypoxic evidence of fluid overload as well as anxiety.  Patient progressively got worse in the ER and now is on BiPAP.  She is hypoxic and appears to have evidence of anasarca.  She has been admitted to the hospital for angina with acute on chronic respiratory failure with hypoxia, temperature 98 blood pressure 153/55, pulse 94 respirate of 29 oxygen sat 92% on room air.  Currently 96% on BiPAP.  Hemoglobin is 9.4.  Potassium 5.2.  BUN 63 and creatinine 1.40.  Glucose is 210.  BNP is 593.  Initial troponin 24-second troponin 25.  COVID-19 screen is currently pending.  Chest x-ray showed slight left midlung atelectasis.  No edema or  airspace opacity. Despite several days of diuresis her respiratory status is not improved and she continues to have hemoptysis with consult placed for PCCM for additional evaluation from pulmonary perspective.   -11/06/20- Vitals improved, weaning O2.  Patient diuresed >4L thus far.  Reviewed blood work, CBC is reassuring without concerning findings, metabolic profile reviewed with significant hyperglycemia, acute on chronic renal insufficiency in the context of anasarca with CHF and CKD/OHS.  Optimizing diuretic continuing Lasix will change to 40 twice daily adding Aldactone 50 daily.  Blood pressure is good with mild hypertension which will be useful while diuresing.  We will need to concentrate on decreasing additional nephrotoxins while diuresing.  Also will perform US liver to investigate steatohepatitis as additional etiology of recurrent fluid retention with anasarca.   11/07/20-patient still with hemoptysis, but its slowing down.  She is on heparin McNabb for dvt ppx, she has terribly swollen ankles with tenderness unable to use SCDs.  She has severe comorbid conditions with complex decision making.  Fluid balance >5L net negative. On 5L O2  11/08/2020-patient reports clinical improvement, we discussed needing to work with physical therapy more and patient states she will attempt, hemoptysis has essentially resolved but she does have intermittent cough of blood streak expectorate.  Patient is -6-1/2 L urine output fluid balance.  She  continues to work with respiratory therapist utilizing Kittson device for recruitment maneuvers.  I discussed her care plan with attending physician Dr. Posey Pronto today with short-term plan to increase physical and occupational therapy due to severe deconditioning with bibasilar atelectasis contributing to hypoxemic respiratory failure.  Repeat chest x-ray done 517 shows cardiomegaly with pulmonary edema, there is repeat transthoracic echo.  Patient is with depression post loss of  her husband recently after lung cancer battle.   11/09/20- patient is slowly improving.  She had BM overnight and feels emptied out which is helping.  She was able to get OOB to chair and is working with PT/OT.  She is negative 8L negative  PAST MEDICAL HISTORY   Past Medical History:  Diagnosis Date  . Arthritis   . Asthma   . Barrett's esophagus   . Breast cancer ([place]) 1999   LT MASTECTOMY  . Bronchiectasis ([place])   . COPD (chronic obstructive pulmonary disease) ([place])   . Diabetes mellitus ([place])   . Hypercholesterolemia   . Hypertension   . Lymphedema   . Mini stroke ([place])   . Osteoporosis   . Pacemaker      SURGICAL HISTORY   Past Surgical History:  Procedure Laterality Date  . CARPAL TUNNEL RELEASE     right  . CATARACT EXTRACTION     bilateral  . GALLBLADDER SURGERY    . MASTECTOMY Left 1999   BREAST CA  . PACEMAKER INSERTION Right 03/21/2015   Procedure: INSERTION PACEMAKER;  Surgeon: Isaias Cowman, MD;  Location: [facility] ORS;  Service: Cardiovascular;  Laterality: Right;  . TOTAL HIP ARTHROPLASTY  2001   left  . TRIGGER FINGER RELEASE     bilateral     FAMILY HISTORY   Family History  Problem Relation Age of Onset  . Heart disease Paternal Grandmother   . Diabetes Paternal Grandmother   . Heart disease Father   . Heart disease Mother   . Heart disease Maternal Grandmother   . Stroke Maternal Aunt   . Breast cancer Paternal Aunt 94  . Lung cancer Brother 7  . Kidney disease Cousin   . Bladder Cancer Neg Hx      SOCIAL HISTORY   Social History   Tobacco Use  . Smoking status: Former Smoker    Packs/day: 1.00    Years: 30.00    Pack years: 30.00    Types: Cigarettes    Quit date: 12/27/1992    Years since quitting: 27.8  . Smokeless tobacco: Never Used  Substance Use Topics  . Alcohol use: No  . Drug use: No     MEDICATIONS    Home Medication:    Current Medication:  Current Facility-Administered Medications:  .  0.9 %   sodium chloride infusion, 250 mL, Intravenous, PRN, Teodoro Spray, MD .  ALPRAZolam Duanne Moron) tablet 0.5 mg, 0.5 mg, Oral, BID, 0.5 mg at 11/09/20 0844 **AND** ALPRAZolam Duanne Moron) tablet 0.5 mg, 0.5 mg, Oral, Daily PRN, Renda Rolls, RPH, 0.5 mg at 11/06/20 2043 .  calcium carbonate (TUMS - dosed in mg elemental calcium) chewable tablet 200 mg of elemental calcium, 1 tablet, Oral, PRN, Jonelle Sidle, Mohammad L, MD, 200 mg of elemental calcium at 11/02/20 0928 .  capsaicin (ZOSTRIX) 0.025 % cream, , Topical, BID, Sidney Ace, MD, Given at 11/07/20 2131 .  chlorhexidine (PERIDEX) 0.12 % solution 15 mL, 15 mL, Mouth Rinse, BID, Jonelle Sidle, Mohammad L, MD, 15 mL at 11/09/20 0856 .  Chlorhexidine Gluconate Cloth 2 %  PADS 6 each, 6 each, Topical, Q0600, Elwyn Reach, MD, 6 each at 11/09/20 0542 .  furosemide (LASIX) injection 40 mg, 40 mg, Intravenous, BID, Lanney Gins, Smera Guyette, MD, 40 mg at 11/09/20 0841 .  insulin aspart (novoLOG) injection 0-9 Units, 0-9 Units, Subcutaneous, TID WC, Domenic Polite, MD, 2 Units at 11/09/20 [phone]  insulin aspart protamine- aspart (NOVOLOG MIX 70/30) injection 20 Units, 20 Units, Subcutaneous, BID WC, Ottie Glazier, MD, 20 Units at 11/08/20 1706 .  levalbuterol (XOPENEX) nebulizer solution 0.63 mg, 0.63 mg, Nebulization, Q6H PRN, Ottie Glazier, MD, 0.63 mg at 11/08/20 2123 .  lidocaine (LIDODERM) 5 % 1 patch, 1 patch, Transdermal, Q24H, Sreenath, Sudheer B, MD, 1 patch at 10/27/2020 0824 .  magnesium hydroxide (MILK OF MAGNESIA) suspension 30 mL, 30 mL, Oral, QHS PRN, Jonelle Sidle, Mohammad L, MD .  MEDLINE mouth rinse, 15 mL, Mouth Rinse, q12n4p, Garba, Mohammad L, MD, 15 mL at 11/07/20 1745 .  methocarbamol (ROBAXIN) tablet 500 mg, 500 mg, Oral, Q8H PRN, Priscella Mann, Sudheer B, MD, 500 mg at 10/26/2020 0821 .  methylPREDNISolone sodium succinate (SOLU-MEDROL) 40 mg/mL injection 30 mg, 30 mg, Intravenous, Daily, Lanney Gins, Georga Stys, MD, 30 mg at 11/09/20 0841 .  multivitamin with  minerals tablet 1 tablet, 1 tablet, Oral, QODAY, Elwyn Reach, MD, 1 tablet at 11/09/20 0844 .  mupirocin ointment (BACTROBAN) 2 %, , Topical, BID, Elwyn Reach, MD, Given at 11/08/20 2029 .  ondansetron (ZOFRAN) injection 4 mg, 4 mg, Intravenous, Q6H PRN, Garba, Mohammad L, MD .  pantoprazole (PROTONIX) EC tablet 40 mg, 40 mg, Oral, Daily, Jonelle Sidle, Mohammad L, MD, 40 mg at 11/09/20 0844 .  simethicone (MYLICON) chewable tablet 80 mg, 80 mg, Oral, QID PRN, Ralene Muskrat B, MD, 80 mg at 10/31/20 1744 .  simvastatin (ZOCOR) tablet 20 mg, 20 mg, Oral, q1600, Jonelle Sidle, Mohammad L, MD, 20 mg at 11/08/20 1705 .  sodium chloride (OCEAN) 0.65 % nasal spray 1 spray, 1 spray, Each Nare, PRN, Domenic Polite, MD .  sodium chloride flush (NS) 0.9 % injection 3 mL, 3 mL, Intravenous, Q12H, Teodoro Spray, MD, 3 mL at 11/09/20 0858 .  sodium chloride flush (NS) 0.9 % injection 3 mL, 3 mL, Intravenous, PRN, Teodoro Spray, MD .  spironolactone (ALDACTONE) tablet 50 mg, 50 mg, Oral, Daily, Lanney Gins, Jonmarc Bodkin, MD, 50 mg at 11/09/20 0845 .  triamcinolone cream (KENALOG) 0.1 % cream, , Topical, BID, Elwyn Reach, MD, Given at 11/09/20 [id number]    ALLERGIES   Aspirin, Celebrex [celecoxib], Meloxicam, Nsaids, Quetiapine, Rofecoxib, Sulfa antibiotics, Tolmetin, Bactrim [sulfamethoxazole-trimethoprim], Biguanide copolymer, Buprenorphine hcl, Cetirizine, Ciprofloxacin, Codeine, Dextromethorphan hbr, Dextromethorphan polistirex er, Furosemide, Hydrocodone, Metformin, Morphine and related, Penicillins, Pregabalin, Promethazine, Propoxyphene, Propoxyphene, Quinolones, Seroquel [quetiapine fumarate], Serotonin reuptake inhibitors (ssris), Tramadol, and Ultracet [tramadol-acetaminophen]     REVIEW OF SYSTEMS    Review of Systems:  Gen:  Denies  fever, sweats, chills weigh loss  HEENT: Denies blurred vision, double vision, ear pain, eye pain, hearing loss, nose bleeds, sore throat Cardiac:  No dizziness,  chest pain or heaviness, chest tightness,edema Resp:   Denies cough or sputum porduction, shortness of breath,wheezing, hemoptysis,  Gi: Denies swallowing difficulty, stomach pain, nausea or vomiting, diarrhea, constipation, bowel incontinence Gu:  Denies bladder incontinence, burning urine Ext:   Denies Joint pain, stiffness or swelling Skin: Denies  skin rash, easy bruising or bleeding or hives Endoc:  Denies polyuria, polydipsia , polyphagia or weight change Psych:   Denies depression, insomnia or hallucinations  Other:  All other systems negative   VS: BP (!) 144/42 (BP Location: Right Wrist)   Pulse 81   Temp 97.8 F (36.6 C)   Resp 20   Ht 4\' 11"  (1.499 m)   Wt 117.3 kg   SpO2 95%   BMI 52.23 kg/m      PHYSICAL EXAM    GENERAL:NAD, no fevers, chills, no weakness no fatigue HEAD: Normocephalic, atraumatic.  EYES: Pupils equal, round, reactive to light. Extraocular muscles intact. No scleral icterus.  MOUTH: Moist mucosal membrane. Dentition intact. No abscess noted.  EAR, NOSE, THROAT: Clear without exudates. No external lesions.  NECK: Supple. No thyromegaly. No nodules. No JVD.  PULMONARY: rhonchi bilaterally and crackles  CARDIOVASCULAR: S1 and S2. Regular rate and rhythm. No murmurs, rubs, or gallops. No edema. Pedal pulses 2+ bilaterally.  GASTROINTESTINAL: Soft, nontender, nondistended. No masses. Positive bowel sounds. No hepatosplenomegaly.  MUSCULOSKELETAL: No swelling, clubbing, or edema. Range of motion full in all extremities.  NEUROLOGIC: Cranial nerves II through XII are intact. No gross focal neurological deficits. Sensation intact. Reflexes intact.  SKIN: No ulceration, lesions, rashes, or cyanosis. Skin warm and dry. Turgor intact.  PSYCHIATRIC: Mood, affect within normal limits. The patient is awake, alert and oriented x 3. Insight, judgment intact.       IMAGING    DG Chest 2 View  Result Date: 11/02/2020 CLINICAL DATA:  Shortness of breath  EXAM: CHEST - 2 VIEW COMPARISON:  December 07, 2018. FINDINGS: There is no edema or airspace opacity. There is slight atelectasis in the left mid lung. Heart is upper normal in size with pacemaker leads attached to the right atrium and right ventricle. No adenopathy. There is aortic atherosclerosis. There is degenerative change in the thoracic spine. IMPRESSION: Slight left midlung atelectasis. No edema or airspace opacity. Heart upper normal in size with pacemaker leads attached to right atrium and right ventricle. Aortic Atherosclerosis (ICD10-I70.0). Electronically Signed   By: Lowella Grip III M.D.   On: 11/13/2020 17:08   CT CHEST WO CONTRAST  Result Date: 11/04/2020 CLINICAL DATA:  Respiratory failure, worsening hypoxia, CHF, concern for infectious process EXAM: CT CHEST WITHOUT CONTRAST TECHNIQUE: Multidetector CT imaging of the chest was performed following the standard protocol without IV contrast. COMPARISON:  10/28/2020 FINDINGS: Cardiovascular: Aortic atherosclerosis. Right chest multi lead pacer. Cardiomegaly. Left and right coronary artery calcifications. No pericardial effusion. Mediastinum/Nodes: No enlarged mediastinal, hilar, or axillary lymph nodes. Thyroid gland, trachea, and esophagus demonstrate no significant findings. Lungs/Pleura: Small to moderate bilateral pleural effusions and associated atelectasis or consolidation, slightly increased compared to prior examination. Very extensive heterogeneous and ground-glass airspace opacity throughout the lungs, substantially increased compared to prior examination. Mild interlobular septal thickening. Upper Abdomen: No acute abnormality. Musculoskeletal: No chest wall mass or suspicious bone lesions identified. IMPRESSION: 1. Small to moderate bilateral pleural effusions and associated atelectasis or consolidation, slightly increased compared to prior examination. 2. Very extensive heterogeneous and ground-glass airspace opacity throughout the  lungs, substantially increased compared to prior examination. Mild interlobular septal thickening. Findings are consistent with worsened multifocal infection, edema, and/or ARDS. 3. Cardiomegaly and coronary artery disease. Aortic Atherosclerosis (ICD10-I70.0). Electronically Signed   By: Eddie Candle M.D.   On: 11/04/2020 16:32   CT ANGIO CHEST PE W OR WO CONTRAST  Result Date: 10/28/2020 CLINICAL DATA:  87 year old female with concern for pulmonary embolism. EXAM: CT ANGIOGRAPHY CHEST WITH CONTRAST TECHNIQUE: Multidetector CT imaging of the chest was performed using the standard protocol during bolus  administration of intravenous contrast. Multiplanar CT image reconstructions and MIPs were obtained to evaluate the vascular anatomy. CONTRAST:  70mL OMNIPAQUE IOHEXOL 350 MG/ML SOLN COMPARISON:  Chest CT dated 02/07/2016. FINDINGS: Cardiovascular: Mild cardiomegaly. No pericardial effusion. There is coronary vascular calcification and calcification of the mitral annulus. Right pectoral pacemaker device. Advanced atherosclerotic calcification of the thoracic aorta. No aneurysmal dilatation. Evaluation of the pulmonary arteries is limited due to respiratory motion artifact. No large or central pulmonary artery embolus identified. Mediastinum/Nodes: No hilar or mediastinal adenopathy. The esophagus is grossly unremarkable. No mediastinal fluid collection. Lungs/Pleura: There are small bilateral pleural effusions with bibasilar atelectasis. Diffuse interstitial and interlobular septal prominence and patchy bilateral ground-glass opacities consistent with vascular congestion and edema. Scattered ground-glass nodularity may represent edema or pneumonia. Clinical correlation is recommended. No pneumothorax. The central airways are patent. Upper Abdomen: There is a 15 mm hypodense lesion in the right lobe of the liver. Cholecystectomy. Indeterminate subcentimeter left renal hypodense lesion. Musculoskeletal: Osteopenia  with scoliosis and degenerative changes of the spine. No acute osseous pathology. Review of the MIP images confirms the above findings. IMPRESSION: 1. No CT evidence of central pulmonary artery embolus. 2. Mild cardiomegaly with findings of CHF and small bilateral pleural effusions. Scattered ground-glass nodularity may represent edema or pneumonia. Clinical correlation is recommended. 3. Aortic Atherosclerosis (ICD10-I70.0). Electronically Signed   By: Anner Crete M.D.   On: 10/28/2020 01:14   DG Chest Port 1 View  Result Date: 11/07/2020 CLINICAL DATA:  Shortness of breath EXAM: PORTABLE CHEST 1 VIEW COMPARISON:  11/03/2020, CT chest 11/04/2020 FINDINGS: Right-sided pacing device as before. Small bilateral pleural effusions. Cardiomegaly with extensive bilateral consolidations and ground-glass opacities, worse as compared with 513. Aortic atherosclerosis. No pneumothorax IMPRESSION: Cardiomegaly with widespread consolidations and ground-glass opacities, worse as compared with 11/03/2020. Small pleural effusions. Electronically Signed   By: Donavan Foil M.D.   On: 11/07/2020 17:03   DG Chest Port 1 View  Result Date: 11/03/2020 CLINICAL DATA:  Hypoxia. EXAM: PORTABLE CHEST 1 VIEW COMPARISON:  Radiographs 11/07/2020 and 10/26/2020.  CT 10/28/2020. FINDINGS: 0934 hours. Right subclavian pacemaker leads appear unchanged over the right atrium and right ventricle. There is stable mild cardiomegaly and aortic atherosclerosis. There are progressive nodular airspace opacities in both lungs with small bilateral pleural effusions. No evidence of pneumothorax. The bones appear unchanged. IMPRESSION: Progressive nodular airspace opacities in both lungs suspicious for multifocal pneumonia, possibly viral in etiology. There may be a component of pulmonary edema as well, and small bilateral pleural effusions are present. Electronically Signed   By: Richardean Sale M.D.   On: 11/03/2020 10:04   DG Chest Port 1  View  Result Date: 11/15/2020 CLINICAL DATA:  87 year old female with wheezing. EXAM: PORTABLE CHEST 1 VIEW COMPARISON:  Chest radiograph dated 11/11/2020. FINDINGS: There is cardiomegaly with vascular congestion and edema. Pneumonia is not excluded clinical correlation recommended. Small bilateral pleural effusion. No pneumothorax. Atherosclerotic calcification of the aorta. Right pectoral pacemaker device. No acute osseous pathology. IMPRESSION: Cardiomegaly with findings of CHF. Pneumonia is not excluded. Electronically Signed   By: Anner Crete M.D.   On: 11/02/2020 02:26   ECHOCARDIOGRAM COMPLETE  Result Date: 10/29/2020    ECHOCARDIOGRAM REPORT   Patient Name:   TIHANNA GOODSON [facility] Date of Exam: 11/15/2020 Medical Rec #:  [id number]        Height:       59.0 in Accession #:    [id number]       Weight:  275.8 lb Date of Birth:  30-Jun-1933        BSA:          2.114 m Patient Age:    72 years         BP:           128/75 mmHg Patient Gender: F                HR:           78 bpm. Exam Location:  [facility] Procedure: 2D Echo, Color Doppler and Cardiac Doppler Indications:     CHF- acute diastolic [id number]  History:         Patient has prior history of Echocardiogram examinations, most                  recent 03/17/2015. Pacemaker, COPD; Risk Factors:Hypertension                  and Diabetes.  Sonographer:     Sherrie Sport RDCS (AE) Referring Phys:  [place] Diagnosing Phys: Isaias Cowman MD IMPRESSIONS  1. Left ventricular ejection fraction, by estimation, is 55 to 60%. The left ventricle has normal function. The left ventricle has no regional wall motion abnormalities. Left ventricular diastolic parameters were normal.  2. Right ventricular systolic function is normal. The right ventricular size is normal.  3. The mitral valve is normal in structure. Mild to moderate mitral valve regurgitation. No evidence of mitral stenosis.  4. The aortic valve is normal in structure. Aortic valve  regurgitation is moderate. No aortic stenosis is present.  5. The inferior vena cava is normal in size with greater than 50% respiratory variability, suggesting right atrial pressure of 3 mmHg. FINDINGS  Left Ventricle: Left ventricular ejection fraction, by estimation, is 55 to 60%. The left ventricle has normal function. The left ventricle has no regional wall motion abnormalities. The left ventricular internal cavity size was normal in size. There is  no left ventricular hypertrophy. Left ventricular diastolic parameters were normal. Right Ventricle: The right ventricular size is normal. No increase in right ventricular wall thickness. Right ventricular systolic function is normal. Left Atrium: Left atrial size was normal in size. Right Atrium: Right atrial size was normal in size. Pericardium: There is no evidence of pericardial effusion. Mitral Valve: The mitral valve is normal in structure. Mild to moderate mitral valve regurgitation. No evidence of mitral valve stenosis. Tricuspid Valve: The tricuspid valve is normal in structure. Tricuspid valve regurgitation is mild . No evidence of tricuspid stenosis. Aortic Valve: The aortic valve is normal in structure. Aortic valve regurgitation is moderate. No aortic stenosis is present. Aortic valve mean gradient measures 15.7 mmHg. Aortic valve peak gradient measures 27.2 mmHg. Aortic valve area, by VTI measures  0.86 cm. Pulmonic Valve: The pulmonic valve was normal in structure. Pulmonic valve regurgitation is not visualized. No evidence of pulmonic stenosis. Aorta: The aortic root is normal in size and structure. Venous: The inferior vena cava is normal in size with greater than 50% respiratory variability, suggesting right atrial pressure of 3 mmHg. IAS/Shunts: No atrial level shunt detected by color flow Doppler.  LEFT VENTRICLE PLAX 2D LVIDd:         5.12 cm  Diastology LVIDs:         3.55 cm  LV e' medial:    3.59 cm/s LV PW:         1.40 cm  LV E/e' medial:   41.5  LV IVS:        0.99 cm  LV e' lateral:   5.87 cm/s LVOT diam:     2.00 cm  LV E/e' lateral: 25.4 LV SV:         52 LV SV Index:   25 LVOT Area:     3.14 cm  RIGHT VENTRICLE RV Basal diam:  4.07 cm RV S prime:     14.10 cm/s TAPSE (M-mode): 4.6 cm LEFT ATRIUM             Index       RIGHT ATRIUM           Index LA diam:        4.60 cm 2.18 cm/m  RA Area:     21.00 cm LA Vol (A2C):   89.2 ml 42.19 ml/m RA Volume:   60.40 ml  28.57 ml/m LA Vol (A4C):   78.7 ml 37.22 ml/m LA Biplane Vol: 83.9 ml 39.68 ml/m  AORTIC VALVE                    PULMONIC VALVE AV Area (Vmax):    0.94 cm     PV Vmax:        0.89 m/s AV Area (Vmean):   0.96 cm     PV Peak grad:   3.1 mmHg AV Area (VTI):     0.86 cm     RVOT Peak grad: 3 mmHg AV Vmax:           261.00 cm/s AV Vmean:          184.333 cm/s AV VTI:            0.606 m AV Peak Grad:      27.2 mmHg AV Mean Grad:      15.7 mmHg LVOT Vmax:         77.70 cm/s LVOT Vmean:        56.100 cm/s LVOT VTI:          0.166 m LVOT/AV VTI ratio: 0.27  AORTA Ao Root diam: 2.70 cm MITRAL VALVE                TRICUSPID VALVE MV Area (PHT): 5.58 cm     TR Peak grad:   40.7 mmHg MV Decel Time: 136 msec     TR Vmax:        319.00 cm/s MV E velocity: 149.00 cm/s MV A velocity: 127.00 cm/s  SHUNTS MV E/A ratio:  1.17         Systemic VTI:  0.17 m                             Systemic Diam: 2.00 cm Isaias Cowman MD Electronically signed by Isaias Cowman MD Signature Date/Time: 11/10/2020/1:07:34 PM    Final    US LIVER DOPPLER  Result Date: 11/09/2020 CLINICAL DATA:  Non alcoholic steatohepatitis EXAM: DUPLEX ULTRASOUND OF LIVER TECHNIQUE: Color and duplex Doppler ultrasound was performed to evaluate the hepatic in-flow and out-flow vessels. Technologist describes technically difficult study secondary to labored breathing, patient on non-rebreather mask. COMPARISON:  CT 09/10/2019 and previous FINDINGS: Liver: Normal parenchymal echogenicity. Normal hepatic contour without  nodularity. 2.4 x 1.7 x 1.4 cm simple appearing cyst in the lateral left hepatic segment as before. Main Portal Vein size: 1.2 cm Portal Vein Velocities (all hepatopetal): Main Prox:  48 cm/sec Main Mid: 37 cm/sec Main  Dist:  39 cm/sec Right: 21 cm/sec Left: 9 cm/sec Hepatic Vein Velocities (all hepatofugal): Right:  16 cm/sec Middle:  22 cm/sec Left:  25 cm/sec IVC: Present and patent with normal respiratory phasicity. Velocity 27 cm/seconds. Diameter 1.3 cm. Hepatic Artery Velocity:  58 cm/sec Splenic Vein Velocity:  26 cm/sec Spleen: 9.7 cm x 3.7 cm x 11.5 cm with a total volume of 10/23/2016 cm^3 (411 cm^3 is upper limit normal) Portal Vein Occlusion/Thrombus: No Splenic Vein Occlusion/Thrombus: No Ascites: None Varices: None IMPRESSION: 1. Unremarkable hepatic vascular Doppler evaluation. 2. Stable simple appearing cyst in the lateral left hepatic segment. Electronically Signed   By: Lucrezia Europe M.D.   On: 11/09/2020 11:38        ASSESSMENT/PLAN   Acute on chronic hypoxemic respiratory failure Multifactorial  - non massive hemoptysis, OSA/OHS, CHF, atelectasis , ckd with 3rd spaced fluid /anasarca, and protein calorie malnutrition -I dont see signs of infection at this time, will stop antibiotics  --arterial blood gas and repeat cxr -11/07/20 - decrease steroids to 30 soluemdrol once daily   Anasarca   -Most recent transthoracic echo without findings of heart failure  -US liver rule out cirrhosis secondary to NASH -Continue diuresis adding Aldactone with Lasix 40 twice daily and albumin repletion -Meds nephrotoxins to be reduced or dcd    Non-massive hemoptysis  - stopped eliquis   -supportive care    -will consider nebulzed tXA if necessary   Severe bibasilar atelectasis  - IS at bedside  - BID MEtaneb with saline   Bilateral pleural effusions worse on right  - continue diuresis - agree with lasix 80 bid   - decrease infusion of any volume - stopped maxipime   COPD - chornic   - due to AF - stop duonep will start xopenex only   - no need to continue pulmicort  Severe decondintiong   - aggressive PT/OT - contributing to atelectasis and respiratory failure   Moderate OSA on CPAP   - on BIPAP now   - there is skin breakdown over nasal bridge - ordered Gecko gelpad QHS interface.     Chronic atrial fibrillation  - patient is on eluqis which is being held for now, cardiology is on case.  Non-massive hemoptysis is resolving.    Chronic kidney disease Dc nephrotoxins -continue diuresis    Thank you for allowing me to participate in the care of this patient.  Total face to face encounter time for this patient visit was 40 min. >50% of the time was  spent in counseling and coordination of care.   Patient/Family are satisfied with care plan and all questions have been answered.  This document was prepared using Dragon voice recognition software and may include unintentional dictation errors.     Ottie Glazier, M.D.  Division of [place]

## 2020-11-09 NOTE — Progress Notes (Signed)
Occupational Therapy Treatment Patient Details Name: Teresa Krueger MRN: [id number] DOB: 05-May-1934 Today's Date: 11/09/2020    History of present illness Teresa Krueger is an 87 y/o F admitted on 11/06/2020 with c/c of SOB over the last week, pain on L side of neck & back, & BLE edema. Pt being treated for acute decompensated diastolic CHF & hypoxic respiratory failure. PMH: CAD, chronic lymphadema, COPD, CKD stage 3, HLD, HTN, DM, morbid obesity. Cardiac cath on 5/10 canceled due to concerns of kidney function.   OT comments  Pt seen for OT tx this date to f/u re: safety with ADLs/ADL mobility. OT engages pt in LB bathing and toileting tasks with MOD A to stand with RW And MAX A for posterior LB bathing and peri care. Pt generally with better tolerance today. Continued to require nasal cannla at all times and prefers to use orally when performing activity. Pt requires increased time and rest breaks for all tasks performed, but generally displays more energy and effort today. Requires encouragement to participate throughout. Pt goals updated as pt has had prolonged hospital stay, complicated by period of increased oxygen needs. Pt requiring some increased assist versus initial evaluation and therefore, some goals downgraded to allow pt to attain in a timely manner while also still working to progress closer to her functional baseline. Although pt was more participatory today with slightly increased effort and tolerance noted during treatment; OT Continues to anticipate that pt will require STR in SNF setting upon d/c from hospital d/t general deconditioning that has occurred over course of stay.     Follow Up Recommendations  SNF    Equipment Recommendations  None recommended by OT    Recommendations for Other Services      Precautions / Restrictions Precautions Precautions: Fall Restrictions Weight Bearing Restrictions: No       Mobility Bed Mobility Overal bed mobility: Needs  Assistance Bed Mobility: Supine to Sit Rolling: Mod assist   Supine to sit: Mod assist;HOB elevated     General bed mobility comments: up to chair pre/post session    Transfers Overall transfer level: Needs assistance Equipment used: Rolling walker (2 wheeled) Transfers: Sit to/from Stand Sit to Stand: Mod assist;From elevated surface Stand pivot transfers: Mod assist;From elevated surface       General transfer comment: increased time to come to stand, improved capability and tolerance for push to stand from recliner with use of arm rests with minimal cues.    Balance Overall balance assessment: Needs assistance Sitting-balance support: Feet supported;No upper extremity supported Sitting balance-Leahy Scale: Fair Sitting balance - Comments: F static sitting balance Postural control: Posterior lean Standing balance support: Bilateral upper extremity supported Standing balance-Leahy Scale: Poor Standing balance comment: requires UE support as well as external support to sustain static stand and is noted to lean FWD on RW with hips flexed d/t limited tolerance for even maintaining upright standing.                           ADL either performed or assessed with clinical judgement   ADL Overall ADL's : Needs assistance/impaired     Grooming: Brushing hair;Minimal assistance;Moderate assistance;Sitting Grooming Details (indicate cue type and reason): increased time, slow effortful pace     Lower Body Bathing: Moderate assistance;Maximal assistance;Sit to/from stand Lower Body Bathing Details (indicate cue type and reason): with RW for standing balance, Pt demos improved standing tolerance and decreased need for assist from her  sister. Pt requires MAX A for posterior LB bathing in standing, able to perform anterior LB bathing in sitting with SETUP.             Toileting- Clothing Manipulation and Hygiene: Moderate assistance;Maximal assistance;Sit to/from  stand Toileting - Clothing Manipulation Details (indicate cue type and reason): for clothing mgt             Vision Baseline Vision/History: Wears glasses Wears Glasses: At all times Patient Visual Report: No change from baseline     Perception     Praxis      Cognition Arousal/Alertness: Awake/alert Behavior During Therapy: WFL for tasks assessed/performed Overall Cognitive Status: Within Functional Limits for tasks assessed                                 General Comments: some improved communication and interaction today. appropriate with all commands. generally more energy.        Exercises Total Joint Exercises Quad Sets: AROM;Supine;5 reps Hip ABduction/ADduction: AROM;Supine;5 reps Long Arc Quad: AROM;5 reps;Seated Other Exercises Other Exercises: OT engages pt in LB bathing and toileting tasks with MOD A to stand with RW And MAX A for posterior LB bathing and peri care. Pt generally with better tolerance today. Continued to require nasal cannla at all times and prefers to use orally when performing activity. Pt requires increased time and rest breaks for all tasks performed, but generally displays more energy and effort today. Requires encouragement to participate throughout.   Shoulder Instructions       General Comments Pt on 6Lnc, PT gave pt second nasal cannula as pt noted to mouth breathe with exertion and second O2 source used in pt's mouth also on 6Lnc. Pt O2 gradaully improved throughout session to ultimately maintaining at ~94% and above. Pt only de-sat to 90% with fxl transfers. Nursing requested for pt not to utilize 2 sets of cannula going forward, but to increase to 7L and then taper back down to 6L.    Pertinent Vitals/ Pain       Pain Assessment: Faces Faces Pain Scale: Hurts a little bit Pain Location: decreased pain and decreased coughing today, some chest discomfort with coughs Pain Descriptors / Indicators: Discomfort Pain  Intervention(s): Limited activity within patient's tolerance;Monitored during session;Repositioned  Home Living                                          Prior Functioning/Environment              Frequency  Min 2X/week        Progress Toward Goals  OT Goals(current goals can now be found in the care plan section)  Progress towards OT goals: Progressing toward goals  Acute Rehab OT Goals Patient Stated Goal: to go home and be able to age in place OT Goal Formulation: With patient Time For Goal Achievement: 11/23/20 Potential to Achieve Goals: Fair ADL Goals Pt Will Perform Lower Body Bathing: with min assist;with mod assist;sit to/from stand (with LRAD/AE PRN) Pt Will Transfer to Toilet: with supervision;stand pivot transfer;bedside commode Pt Will Perform Toileting - Clothing Manipulation and hygiene: with min assist;with mod assist;sitting/lateral leans Pt/caregiver will Perform Home Exercise Program: Increased strength;Both right and left upper extremity;With Supervision  Plan Frequency needs to be updated;Discharge plan needs to be updated  Co-evaluation                 AM-PAC OT "6 Clicks" Daily Activity     Outcome Measure   Help from another person eating meals?: A Little Help from another person taking care of personal grooming?: A Lot Help from another person toileting, which includes using toliet, bedpan, or urinal?: A Lot Help from another person bathing (including washing, rinsing, drying)?: A Lot Help from another person to put on and taking off regular upper body clothing?: A Little Help from another person to put on and taking off regular lower body clothing?: A Lot 6 Click Score: 14    End of Session Equipment Utilized During Treatment: Oxygen;Rolling walker  OT Visit Diagnosis: Unsteadiness on feet (R26.81);Muscle weakness (generalized) (M62.81);Pain   Activity Tolerance Patient tolerated treatment well;Other (comment)    Patient Left with call bell/phone within reach;with family/visitor present;with chair alarm set;in chair   Nurse Communication Mobility status;Other (comment)        Time: 1914-7829 OT Time Calculation (min): 29 min  Charges: OT General Charges $OT Visit: 1 Visit OT Treatments $Self Care/Home Management : 8-22 mins $Therapeutic Activity: 8-22 mins  Gerrianne Scale, MS, OTR/L ascom [phone] 11/09/20, 6:27 PM

## 2020-11-09 NOTE — TOC Initial Note (Signed)
Transition of Care [facility]) - Initial/Assessment Note    Patient Details  Name: Teresa Krueger MRN: [id number] Date of Birth: 10/19/1933  Transition [facility]) CM/SW Contact:    Alberteen Sam, LCSW Phone Number: 11/09/2020, 12:20 PM  Clinical Narrative:                  CSW met with patient at bedside, [place] patient's sister was also present. Discussed discharge plan and PT rec of SNF. Patient and sister report they are both in agreement with this plan. Agreeable for CSW to send our referrals to SNFs in [place] and [place] area. Patient does report she has had Pfiezer COVID vaccine and booster.   Patient's sister Jackelyn Poling reports CSW can call her regarding SNF bed offers at [phone] or patient's son Marlou Sa at [phone].   CSW has sent referrals at this time, pending SNF bed offers at this time.   Expected Discharge Plan: Skilled Nursing Facility Barriers to Discharge: Continued Medical Work up   Patient Goals and CMS Choice Patient states their goals for this hospitalization and ongoing recovery are:: to go to SNF CMS Medicare.gov Compare Post Acute Care list provided to:: Patient Choice offered to / list presented to : Patient  Expected Discharge Plan and Services Expected Discharge Plan: [place] Choice: [place] Living arrangements for the past 2 months: [place]: PT,OT,RN,Nurse's Aide [place] Agency: [place] ([place]) Date [place]: 10/31/20 Time HH Agency Contacted: 1100 Representative spoke with at [place]: Floydene Flock  Prior Living Arrangements/Services Living arrangements for the past 2 months: [place] with:: Self Patient language and need for interpreter reviewed:: Yes Do you feel safe going back to the place where you live?: No   needs short term rehab  Need for Family Participation in Patient Care: Yes  (Comment) Care giver support system in place?: Yes (comment) Current home services: Other (comment) ([facility] 9am-1pm.) Criminal Activity/Legal Involvement Pertinent to Current Situation/Hospitalization: No - Comment as needed  Activities of Daily Living Home Assistive Devices/Equipment: Eyeglasses,Bedside commode/3-in-1,Wheelchair ADL Screening (condition at time of admission) Patient's cognitive ability adequate to safely complete daily activities?: Yes Is the patient deaf or have difficulty hearing?: No Does the patient have difficulty seeing, even when wearing glasses/contacts?: Yes (reading glasses) Does the patient have difficulty concentrating, remembering, or making decisions?: No Patient able to express need for assistance with ADLs?: Yes Does the patient have difficulty dressing or bathing?: Yes Independently performs ADLs?: No Communication: Independent Dressing (OT): Needs assistance Is this a change from baseline?: Pre-admission baseline Grooming: Needs assistance Is this a change from baseline?: Pre-admission baseline Feeding: Needs assistance (Set-up) Is this a change from baseline?: Pre-admission baseline Bathing: Needs assistance Is this a change from baseline?: Pre-admission baseline Toileting: Independent In/Out Bed: Independent Walks in Home: Independent with device (comment) (uses wheelchair) Does the patient have difficulty walking or climbing stairs?: Yes Weakness of Legs: Both Weakness of Arms/Hands: None  Permission Sought/Granted Permission sought to share information with : Case Manager,Facility Contact Representative,Family Supports Permission granted to share information with : Yes, Verbal Permission Granted  Share Information with NAME: Jackelyn Poling  Permission granted to share info w AGENCY: SNFs  Permission granted to share info w Relationship: sister  Permission granted to  share info w Contact Information: [phone]  Emotional  Assessment Appearance:: Appears stated age Attitude/Demeanor/Rapport: Gracious Affect (typically observed): Calm Orientation: : Oriented to Self,Oriented to Place,Oriented to  Time,Oriented to Situation Alcohol / Substance Use: Not Applicable Psych Involvement: No (comment)  Admission diagnosis:  CHF (congestive heart failure) (HCC) [I50.9] Dyspnea on exertion [R06.00] Angina at rest [facility]) [I20.8] Chest pain, unspecified type [R07.9] Patient Active Problem List   Diagnosis Date Noted  . Angina at rest [facility]) 11/08/2020  . CHF (congestive heart failure) ([place]) 11/13/2020  . Acute on chronic respiratory failure with hypoxia ([place]) 11/01/2020  . Pedal edema 05/02/2020  . Lightheadedness 03/20/2018  . Syncope 03/09/2018  . CKD (chronic kidney disease) stage 3, GFR 30-59 ml/min (HCC) 02/14/2017  . Carotid artery stenosis 10/29/2016  . TIA (transient ischemic attack) 10/14/2016  . Chronic respiratory failure with hypoxia ([place]) 05/24/2016  . Aortic stenosis, mild 05/02/2016  . SOB (shortness of breath) on exertion 05/02/2016  . Swelling of limb 04/10/2016  . Varicose veins of leg with pain, bilateral 04/10/2016  . Lymphedema 04/10/2016  . Osteoarthritis 03/19/2016  . Osteoporosis 03/19/2016  . Coronary artery calcification seen on CAT scan 02/08/2016  . Incidental lung nodule 02/08/2016  . Thoracic aortic atherosclerosis ([place]) 02/08/2016  . S/P placement of cardiac pacemaker 03/30/2015  . Morbid obesity ([place]) 03/17/2015  . Hypersomnia with sleep apnea 03/17/2015  . Mobitz type 2 second degree AV block 03/16/2015  . Multiple pulmonary nodules 09/13/2014  . Lung field abnormal 09/13/2014  . Barrett's esophagus 12/21/2013  . History of adenomatous polyp of colon 12/21/2013  . Hx of adenomatous colonic polyps 12/21/2013  . Benign essential hypertension 11/30/2013  . Acromioclavicular joint arthritis 11/23/2013  . Glenohumeral arthritis 11/23/2013  . Chest pain 10/21/2013  . Adult  body mass index 50.0-59.9 ([place]) 02/09/2013  . Morbid obesity with BMI of 50.0-59.9, adult ([place]) 02/09/2013  . Primary localized osteoarthrosis, lower leg 02/09/2013  . Bronchiectasis ([place]) 05/19/2012  . Chronic bronchitis 12/31/2011  . Cough 12/31/2011  . GERD (gastroesophageal reflux disease) 12/31/2011  . Diabetes mellitus ([place])   . Hypertension    PCP:  Adin Hector, MD Pharmacy:   [facility] [address] ([address]), [place] [address] [place] ([place]) [place] [address] Phone: [phone] Fax: [place], [place] - [address] [place] [address] Phone: [phone] Fax: [phone]     Social Determinants of Health (SDOH) Interventions    Readmission Risk Interventions No flowsheet data found.

## 2020-11-10 DIAGNOSIS — J9621 Acute and chronic respiratory failure with hypoxia: Secondary | ICD-10-CM | POA: Diagnosis not present

## 2020-11-10 LAB — GLUCOSE, CAPILLARY
Glucose-Capillary: 167 mg/dL — ABNORMAL HIGH (ref 70–99)
Glucose-Capillary: 72 mg/dL (ref 70–99)
Glucose-Capillary: 129 mg/dL — ABNORMAL HIGH (ref 70–99)
Glucose-Capillary: 40 mg/dL — CL (ref 70–99)
Glucose-Capillary: 80 mg/dL (ref 70–99)

## 2020-11-10 MED ORDER — FUROSEMIDE 40 MG PO TABS
40.0000 mg | ORAL_TABLET | Freq: Every day | ORAL | Status: DC
  Administered 2020-11-10 – 2020-11-12 (×3): 40 mg via ORAL
  Filled 2020-11-10 (×4): qty 1

## 2020-11-10 MED ORDER — PREDNISONE 20 MG PO TABS
20.0000 mg | ORAL_TABLET | Freq: Every day | ORAL | Status: DC
  Administered 2020-11-11: 20 mg via ORAL
  Filled 2020-11-10: qty 1

## 2020-11-10 NOTE — TOC Progression Note (Signed)
Transition of Care [facility]) - Progression Note    Patient Details  Name: Teresa Krueger MRN: [id number] Date of Birth: 08-04-1933  Transition of Care [facility]) CM/SW [place], [place] Phone Number: 11/10/2020, 10:15 AM  Clinical Narrative:     CSW met with patient's sister Jackelyn Poling at bedside to provide SNF bed offers. Provided Medicare.gov list of 2 current bed offers of [place] and [facility].   Debbie requests to be updated should other bed offers arrive, and states her brother Marlou Sa will be arriving at bedside this afternoon gave permission to speak to him as well.   No SNF bed choice decided yet, as family would like to wait to see if other bed offers become available with higher ratings.   Expected Discharge Plan: [place] Barriers to Discharge: Continued Medical Work up  Expected Discharge Plan and Services Expected Discharge Plan: [place] Choice: [place] arrangements for the past 2 months: [place] Arranged: PT,OT,RN,Nurse's Aide [facility] Agency: [place] ([place]) Date [place]: 10/31/20 Time HH Agency Contacted: 1100 Representative spoke with at [place]: [place] (SDOH) Interventions    Readmission Risk Interventions No flowsheet data found.

## 2020-11-10 NOTE — Care Management Important Message (Signed)
Important Message  Patient Details  Name: Teresa Krueger MRN: [id number] Date of Birth: 1934/01/28   Medicare Important Message Given:  Yes     Dannette Barbara 11/10/2020, 2:34 PM

## 2020-11-10 NOTE — Progress Notes (Signed)
[facility] [place] NAME: Teresa Krueger    MR#:  [id number]  DATE OF BIRTH:  Aug 29, 1933  SUBJECTIVE:  patient feels better today. She is sitting out in the chair. Sister Jackelyn Poling at bedside. Patient is motivated after discussing with her importance with PT, OT and getting out of bed.  Patient is hesitant to go to rehab. She wants to stay in the hospital as long as she can  REVIEW OF SYSTEMS:   Review of Systems  Constitutional: Negative for chills, fever and weight loss.  HENT: Negative for ear discharge, ear pain and nosebleeds.   Eyes: Negative for blurred vision, pain and discharge.  Respiratory: Negative for sputum production, shortness of breath, wheezing and stridor.   Cardiovascular: Negative for chest pain, palpitations, orthopnea and PND.  Gastrointestinal: Negative for abdominal pain, diarrhea, nausea and vomiting.  Genitourinary: Negative for frequency and urgency.  Musculoskeletal: Negative for back pain and joint pain.  Neurological: Negative for sensory change, speech change, focal weakness and weakness.  Psychiatric/Behavioral: Negative for depression and hallucinations. The patient is not nervous/anxious.    Tolerating Diet:yes Tolerating PT: rehab  DRUG ALLERGIES:   Allergies  Allergen Reactions  . Aspirin Swelling and Other (See Comments)    Pt states that she has tongue swelling.    . Celebrex [Celecoxib] Anaphylaxis  . Meloxicam Swelling  . Nsaids Anaphylaxis, Other (See Comments) and Nausea And Vomiting    unknown   . Quetiapine Anaphylaxis    intolerant  . Rofecoxib Anaphylaxis  . Sulfa Antibiotics Hives, Itching, Anaphylaxis, Nausea And Vomiting and Other (See Comments)  . Tolmetin Anaphylaxis  . Bactrim [Sulfamethoxazole-Trimethoprim] Hives and Itching  . Biguanide Copolymer Other (See Comments)    Reaction:  Unknown   . Buprenorphine Hcl Other (See Comments)    Reaction:  Unknown   . Cetirizine Other  (See Comments)  . Ciprofloxacin Other (See Comments)    Reaction:  Unknown   . Codeine Nausea And Vomiting and Other (See Comments)    Reaction:  Syncope   . Dextromethorphan Hbr Other (See Comments)    Reaction:  GI upset   . Dextromethorphan Polistirex Er Other (See Comments)    Reaction:  GI upset  GI upset  . Furosemide Other (See Comments)    Reaction:  Unknown   . Hydrocodone Other (See Comments)    Reaction:  Unknown   . Metformin Other (See Comments)    Reaction:  Unknown   . Morphine And Related Other (See Comments)    Reaction:  Unknown   . Penicillins Swelling and Other (See Comments)    Pt did not answer the additional questions for this medication because she does not remember.    . Pregabalin Other (See Comments)    Reaction:  Unknown   . Promethazine Other (See Comments)    Reaction:  Unknown   . Propoxyphene Other (See Comments)    Reaction:  Unknown   . Propoxyphene Other (See Comments)  . Quinolones Other (See Comments)    Reaction:  Unknown   . Seroquel [Quetiapine Fumarate] Other (See Comments)    Reaction:  Unknown   . Serotonin Reuptake Inhibitors (Ssris) Other (See Comments)    Reaction:  Unknown   . Tramadol Nausea Only  . Ultracet [Tramadol-Acetaminophen] Other (See Comments)    Reaction:  Unknown     VITALS:  Blood pressure (!) 129/46, pulse 65, temperature 97.9 F (36.6 C), resp. rate 17, height 4'  11" (1.499 m), weight 117.4 kg, SpO2 95 %.  PHYSICAL EXAMINATION:   Physical Exam  GENERAL:  87 y.o.-year-old patient lying in the bed with no acute distress. morbid obesity LUNGS: Normal breath sounds bilaterally, no wheezing, rales, rhonchi. No use of accessory muscles of respiration.  CARDIOVASCULAR: S1, S2 normal. No murmurs, rubs, or gallops.  ABDOMEN: Soft, nontender, nondistended. Bowel sounds present. No organomegaly or mass.  EXTREMITIES: chronic edema b/l.    NEUROLOGIC: Cranial nerves II through XII are intact. No focal Motor or  sensory deficits b/l.  Very deconditoned PSYCHIATRIC:  patient is alert and oriented x 2.  SKIN: No obvious rash, lesion, or ulcer.  LABORATORY PANEL:  CBC Recent Labs  Lab 11/08/20 0547  WBC 9.2  HGB 9.8*  HCT 31.1*  PLT 242    Chemistries  Recent Labs  Lab 11/07/20 0554 11/08/20 0547  NA 140 142  K 4.2 4.3  CL 99 100  CO2 28 28  GLUCOSE 233* 249*  BUN 116* 108*  CREATININE 1.86* 1.42*  CALCIUM 9.4 9.4  AST 28  --   ALT 18  --   ALKPHOS 80  --   BILITOT 1.0  --    Cardiac Enzymes No results for input(s): TROPONINI in the last 168 hours. RADIOLOGY:  US LIVER DOPPLER  Result Date: 11/09/2020 CLINICAL DATA:  Non alcoholic steatohepatitis EXAM: DUPLEX ULTRASOUND OF LIVER TECHNIQUE: Color and duplex Doppler ultrasound was performed to evaluate the hepatic in-flow and out-flow vessels. Technologist describes technically difficult study secondary to labored breathing, patient on non-rebreather mask. COMPARISON:  CT 09/10/2019 and previous FINDINGS: Liver: Normal parenchymal echogenicity. Normal hepatic contour without nodularity. 2.4 x 1.7 x 1.4 cm simple appearing cyst in the lateral left hepatic segment as before. Main Portal Vein size: 1.2 cm Portal Vein Velocities (all hepatopetal): Main Prox:  48 cm/sec Main Mid: 37 cm/sec Main Dist:  39 cm/sec Right: 21 cm/sec Left: 9 cm/sec Hepatic Vein Velocities (all hepatofugal): Right:  16 cm/sec Middle:  22 cm/sec Left:  25 cm/sec IVC: Present and patent with normal respiratory phasicity. Velocity 27 cm/seconds. Diameter 1.3 cm. Hepatic Artery Velocity:  58 cm/sec Splenic Vein Velocity:  26 cm/sec Spleen: 9.7 cm x 3.7 cm x 11.5 cm with a total volume of 10/23/2016 cm^3 (411 cm^3 is upper limit normal) Portal Vein Occlusion/Thrombus: No Splenic Vein Occlusion/Thrombus: No Ascites: None Varices: None IMPRESSION: 1. Unremarkable hepatic vascular Doppler evaluation. 2. Stable simple appearing cyst in the lateral left hepatic segment.  Electronically Signed   By: Lucrezia Europe M.D.   On: 11/09/2020 11:38   ASSESSMENT AND PLAN:   87/F morbidly obese, chronically ill with multiple medical problems namely CAD, chronic diastolic CHF, COPD, chronic lymphedema, CKD stage III AAA, type 2 diabetes mellitus, hypertension  presented to the emergency room with worsening dyspnea on exertion over the last 1 week, intermittent chest pain, worsening lower extremity edema and productive cough. she was noted to be hypoxic with evidence of fluid overload, CTA chest was negative for PE, subsequently started on BiPAP and IV Lasix.  Acute on chronic hypoxic respiratory failure secondary to acute on chronic diastolic CHF/COPD morbid obesity with sleep apnea -- requires BiPAP nightly and intermittently -- CTA on admission consistent with fluid overload. No PE -- continue IV Lasix for ~ 9 Liters negative-- change to oral Lasix -- IV steroids switched to oral taper -- incentive spirometer, duo nebs -- patient needs constant motivation to get out of bed -- PT OT to  see patient--recommends Rehab. TOC informed -- appreciate pulmonary input by Dr A --patient is clinically near baseline.  Mild hemoptysis -- hemoglobin stable -- Plavix on hold  AK I on CKD stage IIIa -- baseline creatinine 1.1 -- creatinine has been ranging 1.4 to 1.6 -- Cozaar held  Chest pain elevated troponin with history of CAD -- suspect demand ischemia -- followed by Dr. Ubaldo Glassing-- ischemic eval defer to the setting of renal insufficiency -- continue statins  History of TIA -- Plavix on hold  Type II diabetes with CKD stage IIIa -- continue 70/30 insulin and sliding scale  Gerd PPI  Chronic lymphedema/obesity  Multiple comorbidities. PT OT input appreciated  improving slowly. She is nearing baseline. She is back on all her oral medication. Patient is recommended rehab. She has to bed offers how were she wants to wait to see if she has any other bed offers. She does not  want to currently accept the bed.    Procedures: Family communication :Sister Debbie in the room Consults : pulmonary, cardiology CODE STATUS: DNR DVT Prophylaxis : Level of care: Progressive Cardiac Status is: Inpatient  Remains inpatient appropriate because:Inpatient level of care appropriate due to severity of illness   Dispo: The patient is from: Home              Anticipated d/c is to: SNF              Patient currently slowly improving.   Difficult to place patient No  TOC for discharge planning to rehab. Patient has to bed offers however she is declining to accept either of those. Patient sister Jackelyn Poling is aware. Will wait another day to see if there is any bed offers.      TOTAL TIME TAKING CARE OF THIS PATIENT: 25 minutes.  >50% time spent on counselling and coordination of care  Note: This dictation was prepared with Dragon dictation along with smaller phrase technology. Any transcriptional errors that result from this process are unintentional.  Fritzi Mandes M.D    [facility]   CC: Primary care physician; Adin Hector, MDPatient ID: [name], female   DOB: [birth year], 87 y.o.   MRN: [id number]

## 2020-11-11 DIAGNOSIS — J9621 Acute and chronic respiratory failure with hypoxia: Secondary | ICD-10-CM | POA: Diagnosis not present

## 2020-11-11 LAB — GLUCOSE, CAPILLARY
Glucose-Capillary: 116 mg/dL — ABNORMAL HIGH (ref 70–99)
Glucose-Capillary: 133 mg/dL — ABNORMAL HIGH (ref 70–99)
Glucose-Capillary: 136 mg/dL — ABNORMAL HIGH (ref 70–99)
Glucose-Capillary: 142 mg/dL — ABNORMAL HIGH (ref 70–99)
Glucose-Capillary: 252 mg/dL — ABNORMAL HIGH (ref 70–99)
Glucose-Capillary: 29 mg/dL — CL (ref 70–99)

## 2020-11-11 LAB — BASIC METABOLIC PANEL
Anion gap: 11 (ref 5–15)
BUN: 98 mg/dL — ABNORMAL HIGH (ref 8–23)
CO2: 32 mmol/L (ref 22–32)
Calcium: 8.9 mg/dL (ref 8.9–10.3)
Chloride: 102 mmol/L (ref 98–111)
Creatinine, Ser: 1.32 mg/dL — ABNORMAL HIGH (ref 0.44–1.00)
GFR, Estimated: 40 mL/min — ABNORMAL LOW (ref >60)
Glucose, Bld: 108 mg/dL — ABNORMAL HIGH (ref 70–99)
Potassium: 4 mmol/L (ref 3.5–5.1)
Sodium: 145 mmol/L (ref 135–145)

## 2020-11-11 LAB — CBC
HCT: 30.7 % — ABNORMAL LOW (ref 36.0–46.0)
Hemoglobin: 9.5 g/dL — ABNORMAL LOW (ref 12.0–15.0)
MCH: 27.2 pg (ref 26.0–34.0)
MCHC: 30.9 g/dL (ref 30.0–36.0)
MCV: 88 fL (ref 80.0–100.0)
Platelets: 248 10*3/uL (ref 150–400)
RBC: 3.49 MIL/uL — ABNORMAL LOW (ref 3.87–5.11)
RDW: 13.8 % (ref 11.5–15.5)
WBC: 10.6 10*3/uL — ABNORMAL HIGH (ref 4.0–10.5)
nRBC: 0.5 % — ABNORMAL HIGH (ref 0.0–0.2)

## 2020-11-11 MED ORDER — DEXTROSE 50 % IV SOLN
25.0000 g | INTRAVENOUS | Status: AC
  Administered 2020-11-11: 25 g via INTRAVENOUS
  Filled 2020-11-11: qty 50

## 2020-11-11 MED ORDER — BISACODYL 10 MG RE SUPP
10.0000 mg | Freq: Every day | RECTAL | Status: DC | PRN
  Administered 2020-11-12 – 2020-11-17 (×2): 10 mg via RECTAL
  Filled 2020-11-11 (×2): qty 1

## 2020-11-11 MED ORDER — INSULIN ASPART PROT & ASPART (70-30 MIX) 100 UNIT/ML ~~LOC~~ SUSP
8.0000 [IU] | Freq: Two times a day (BID) | SUBCUTANEOUS | Status: DC
  Administered 2020-11-12 (×2): 8 [IU] via SUBCUTANEOUS
  Filled 2020-11-11 (×2): qty 10

## 2020-11-11 NOTE — Progress Notes (Signed)
Patient refused Bipap for the night.  Patient stated she wanted to wear her nasal cannula.

## 2020-11-11 NOTE — Progress Notes (Signed)
Physical Therapy Treatment Patient Details Name: Teresa Krueger MRN: [id number] DOB: [birth year] Today's Date: 11/11/2020    History of Present Illness Teresa Krueger is an 87 y/o F admitted on 11/11/2020 with c/c of SOB over the last week, pain on L side of neck & back, & BLE edema. Pt being treated for acute decompensated diastolic CHF & hypoxic respiratory failure. PMH: CAD, chronic lymphadema, COPD, CKD stage 3, HLD, HTN, DM, morbid obesity. Cardiac cath on 5/10 canceled due to concerns of kidney function.    PT Comments    Pt in bed upon entry, awake, agreeable to participate. Pt reports 7/10 pain in knees, asks for help with leg exercises to improve pain. Pt partakes in SAQ x20 bilat, and reports improved pain and joint mobility. Pt comes to standing from elevated surface with minGuardA and RW. Pt received on 4L/min c SpO2 at 91%, pt struggling with dyspnea. DOE worsens with simple bed exercises, drops to 88%, flow rate increased to 5L/min. Further desat to mid 80s% after transition to EOB, flow rate moved to 6L/min, with sats at 83% after moving to recliner. Pt pausing frequently throughout session due to dyspnea, but remains motivated to improve function.     Follow Up Recommendations  Supervision for mobility/OOB;SNF;Supervision - Intermittent     Equipment Recommendations  None recommended by PT    Recommendations for Other Services       Precautions / Restrictions Precautions Precautions: Fall Restrictions Weight Bearing Restrictions: No    Mobility  Bed Mobility Overal bed mobility: Needs Assistance Bed Mobility: Supine to Sit     Supine to sit: Min assist     General bed mobility comments: appears more strong, less labored, pt agrees    Transfers Overall transfer level: Needs assistance Equipment used: Rolling walker (2 wheeled) Transfers: Sit to/from [facility] Sit to Stand: From elevated surface;Min guard Stand pivot transfers: From elevated  surface;Min guard       General transfer comment: safe use of RW, pt needs help getting he slipper on  Ambulation/Gait                 Stairs             Wheelchair Mobility    Modified Rankin (Stroke Patients Only)       Balance                                            Cognition Arousal/Alertness: Awake/alert Behavior During Therapy: WFL for tasks assessed/performed Overall Cognitive Status: Within Functional Limits for tasks assessed                                        Exercises General Exercises - Lower Extremity Short Arc Quad: 20 reps;Supine;Both;Limitations Short Arc Quad Limitations: with bed adjustment and towel roll, improves pain/stiffness    General Comments        Pertinent Vitals/Pain Pain Assessment: 0-10 Pain Score: 7  Pain Location: bilat knees Pain Descriptors / Indicators: Discomfort    Home Living                      Prior Function            PT Goals (current goals can now be found  in the care plan section) Acute Rehab PT Goals Patient Stated Goal: to go home and be able to age in place PT Goal Formulation: With patient Time For Goal Achievement: 11/13/20 Potential to Achieve Goals: Fair Progress towards PT goals: Progressing toward goals    Frequency    Min 2X/week      PT Plan Current plan remains appropriate    Co-evaluation              AM-PAC PT "6 Clicks" Mobility   Outcome Measure  Help needed turning from your back to your side while in a flat bed without using bedrails?: A Lot Help needed moving from lying on your back to sitting on the side of a flat bed without using bedrails?: A Lot Help needed moving to and from a bed to a chair (including a wheelchair)?: A Little Help needed standing up from a chair using your arms (e.g., wheelchair or bedside chair)?: A Little Help needed to walk in hospital room?: A Lot Help needed climbing 3-5 steps  with a railing? : Total 6 Click Score: 13    End of Session Equipment Utilized During Treatment: Oxygen Activity Tolerance: Patient limited by fatigue;Patient limited by lethargy;Patient limited by pain;Patient tolerated treatment well Patient left: with call bell/phone within reach;with nursing/sitter in room;in chair;with chair alarm set Nurse Communication: Mobility status PT Visit Diagnosis: Difficulty in walking, not elsewhere classified (R26.2);Muscle weakness (generalized) (M62.81)     Time: 5329-9242 PT Time Calculation (min) (ACUTE ONLY): 23 min  Charges:  $Therapeutic Exercise: 23-37 mins                     1:00 PM, 11/11/20 Etta Grandchild, PT, DPT Physical Therapist - [facility]  [phone] ([employer])    Wishram C 11/11/2020, 12:52 PM

## 2020-11-11 NOTE — Progress Notes (Signed)
Hypoglycemic Event  CBG: 29  Treatment: 4oz of orange juice and 1 amp of D50  Symptoms: Shortness of breath and pt stated, "I felt like I was fainting."  Follow-up CBG: Time:1225 CBG Result:133  Possible Reasons for Event: Pt given 20u of 70/30 Novolog scheduled this morning. Morning CBG check was 116.  Comments/MD notified: Dr. Posey Pronto. Dr. Posey Pronto to reduce amount of 70/30 Novolog pt is getting. Will continue to monitor.     Corky Crafts

## 2020-11-11 NOTE — Progress Notes (Signed)
[facility] [place] NAME: Teresa Krueger    MR#:  [id number]  DATE OF BIRTH:  1933/07/31  SUBJECTIVE:  patient feels better today. She is sitting out in the chair. Patient earlier ate good breakfast.  Later on reported by RN sugar was 29. Received some orange juice and crackers. Repeat sugar check was 133. pt has been eating fairly well.  REVIEW OF SYSTEMS:   Review of Systems  Constitutional: Negative for chills, fever and weight loss.  HENT: Negative for ear discharge, ear pain and nosebleeds.   Eyes: Negative for blurred vision, pain and discharge.  Respiratory: Negative for sputum production, shortness of breath, wheezing and stridor.   Cardiovascular: Negative for chest pain, palpitations, orthopnea and PND.  Gastrointestinal: Negative for abdominal pain, diarrhea, nausea and vomiting.  Genitourinary: Negative for frequency and urgency.  Musculoskeletal: Negative for back pain and joint pain.  Neurological: Negative for sensory change, speech change, focal weakness and weakness.  Psychiatric/Behavioral: Negative for depression and hallucinations. The patient is not nervous/anxious.    Tolerating Diet:yes Tolerating PT: rehab  DRUG ALLERGIES:   Allergies  Allergen Reactions  . Aspirin Swelling and Other (See Comments)    Pt states that she has tongue swelling.    . Celebrex [Celecoxib] Anaphylaxis  . Meloxicam Swelling  . Nsaids Anaphylaxis, Other (See Comments) and Nausea And Vomiting    unknown   . Quetiapine Anaphylaxis    intolerant  . Rofecoxib Anaphylaxis  . Sulfa Antibiotics Hives, Itching, Anaphylaxis, Nausea And Vomiting and Other (See Comments)  . Tolmetin Anaphylaxis  . Bactrim [Sulfamethoxazole-Trimethoprim] Hives and Itching  . Biguanide Copolymer Other (See Comments)    Reaction:  Unknown   . Buprenorphine Hcl Other (See Comments)    Reaction:  Unknown   . Cetirizine Other (See Comments)  . Ciprofloxacin  Other (See Comments)    Reaction:  Unknown   . Codeine Nausea And Vomiting and Other (See Comments)    Reaction:  Syncope   . Dextromethorphan Hbr Other (See Comments)    Reaction:  GI upset   . Dextromethorphan Polistirex Er Other (See Comments)    Reaction:  GI upset  GI upset  . Furosemide Other (See Comments)    Reaction:  Unknown   . Hydrocodone Other (See Comments)    Reaction:  Unknown   . Metformin Other (See Comments)    Reaction:  Unknown   . Morphine And Related Other (See Comments)    Reaction:  Unknown   . Penicillins Swelling and Other (See Comments)    Pt did not answer the additional questions for this medication because she does not remember.    . Pregabalin Other (See Comments)    Reaction:  Unknown   . Promethazine Other (See Comments)    Reaction:  Unknown   . Propoxyphene Other (See Comments)    Reaction:  Unknown   . Propoxyphene Other (See Comments)  . Quinolones Other (See Comments)    Reaction:  Unknown   . Seroquel [Quetiapine Fumarate] Other (See Comments)    Reaction:  Unknown   . Serotonin Reuptake Inhibitors (Ssris) Other (See Comments)    Reaction:  Unknown   . Tramadol Nausea Only  . Ultracet [Tramadol-Acetaminophen] Other (See Comments)    Reaction:  Unknown     VITALS:  Blood pressure (!) 124/43, pulse 72, temperature 97.8 F (36.6 C), resp. rate 18, height 4\' 11"  (1.499 m), weight 117.2 kg, SpO2 96 %.  PHYSICAL EXAMINATION:   Physical Exam  GENERAL:  87 y.o.-year-old patient lying in the bed with no acute distress. morbid obesity LUNGS: Normal breath sounds bilaterally, no wheezing, rales, rhonchi. No use of accessory muscles of respiration.  CARDIOVASCULAR: S1, S2 normal. No murmurs, rubs, or gallops.  ABDOMEN: Soft, nontender, nondistended. Bowel sounds present. No organomegaly or mass.  EXTREMITIES: chronic edema b/l.    NEUROLOGIC: Cranial nerves II through XII are intact. No focal Motor or sensory deficits b/l.  Weak/  deconditoned PSYCHIATRIC:  patient is alert and oriented x 3  SKIN: No obvious rash, lesion, or ulcer.  LABORATORY PANEL:  CBC Recent Labs  Lab 11/11/20 0440  WBC 10.6*  HGB 9.5*  HCT 30.7*  PLT 248    Chemistries  Recent Labs  Lab 11/07/20 0554 11/08/20 0547 11/11/20 0440  NA 140   < > 145  K 4.2   < > 4.0  CL 99   < > 102  CO2 28   < > 32  GLUCOSE 233*   < > 108*  BUN 116*   < > 98*  CREATININE 1.86*   < > 1.32*  CALCIUM 9.4   < > 8.9  AST 28  --   --   ALT 18  --   --   ALKPHOS 80  --   --   BILITOT 1.0  --   --    < > = values in this interval not displayed.   Cardiac Enzymes No results for input(s): TROPONINI in the last 168 hours. RADIOLOGY:  No results found. ASSESSMENT AND PLAN:   87/F morbidly obese, chronically ill with multiple medical problems namely CAD, chronic diastolic CHF, COPD, chronic lymphedema, CKD stage III AAA, type 2 diabetes mellitus, hypertension  presented to the emergency room with worsening dyspnea on exertion over the last 1 week, intermittent chest pain, worsening lower extremity edema and productive cough. she was noted to be hypoxic with evidence of fluid overload, CTA chest was negative for PE, subsequently started on BiPAP and IV Lasix.  Acute on chronic hypoxic respiratory failure secondary to acute on chronic diastolic CHF/COPD morbid obesity with sleep apnea -- requires BiPAP nightly and intermittently -- CTA on admission consistent with fluid overload. No PE -- continue IV Lasix for ~ 10 Liters negative-- change to oral Lasix -- IV steroids switched to oral taper -- incentive spirometer, duo nebs -- patient needs constant motivation to get out of bed -- PT OT to see patient--recommends Rehab. TOC informed -- appreciate pulmonary input by Dr A --patient is clinically near baseline.  Mild hemoptysis -- hemoglobin stable -- Plavix on hold  AK I on CKD stage IIIa -- baseline creatinine 1.1 -- creatinine has been ranging  1.4 to 1.6 -- Cozaar held  Chest pain elevated troponin with history of CAD -- suspect demand ischemia -- followed by Dr. Ubaldo Glassing-- ischemic eval defer to the setting of renal insufficiency -- continue statins  History of TIA -- Plavix on hold  Type II diabetes with CKD stage IIIa -- continue 70/30 insulin and sliding scale-- dose decreased to 8 units bid due to intermittent low sugar. -- Patient tolerating PO well  Gerd PPI  Chronic lymphedema/obesity  Multiple comorbidities. PT OT input appreciated  improving slowly. She is nearing baseline. She is back on all her oral medication. Patient is recommended rehab. Patient has three that offers. Discussed with patient's son [name] regarding discharge planning. Will continue to monitor sugars over the weekend  hopefully discharge on Monday if patient accepts the bed.   Procedures: Family communication :Debarah Crape Consults : pulmonary, cardiology CODE STATUS: DNR DVT Prophylaxis : Level of care: Progressive Cardiac Status is: Inpatient     Dispo: The patient is from: Home              Anticipated d/c is to: SNF              Patient currently slowly improving.   Difficult to place patient No  TOC for discharge planning to rehab. Patient has to bed offers however she is declining to accept either of those. Will wait over the weekend to see if there is any bed offers and cont to monitor sugars.      TOTAL TIME TAKING CARE OF THIS PATIENT: 25 minutes.  >50% time spent on counselling and coordination of care  Note: This dictation was prepared with Dragon dictation along with smaller phrase technology. Any transcriptional errors that result from this process are unintentional.  Fritzi Mandes M.D    [facility]   CC: Primary care physician; Adin Hector, MDPatient ID: Teresa Krueger, female   DOB: Oct 16, 1933, 87 y.o.   MRN: [id number]

## 2020-11-11 NOTE — Progress Notes (Signed)
Patient refused metaneb therapy due to not feeling well.

## 2020-11-11 NOTE — Plan of Care (Signed)
  Problem: Education: Goal: Knowledge of General Education information will improve Description: Including pain rating scale, medication(s)/side effects and non-pharmacologic comfort measures Outcome: Progressing   Problem: Safety: Goal: Ability to remain free from injury will improve Outcome: Progressing   Problem: Skin Integrity: Goal: Risk for impaired skin integrity will decrease Outcome: Progressing   

## 2020-11-11 NOTE — TOC Progression Note (Signed)
Transition of Care [facility]) - Progression Note    Patient Details  Name: Teresa Krueger MRN: [id number] Date of Birth: 02/25/1934  [facility]) CM/SW Contact  Izola Price, RN Phone Number: 11/11/2020, 2:00 PM  Clinical Narrative:  Patient/family still deciding on facility and updated on accepting facilities. Provider/patient condition warrants another day or two stay due to unstable blood sugars. Spoke with son, [name], [phone]. Patient wants to wait on [facility] to decide. Needs to be in [place] due to son working 12 hours shifts/his ability to visit facility. Simmie Davies RN CM      Expected Discharge Plan: Skilled Nursing Facility Barriers to Discharge: Continued Medical Work up  Expected Discharge Plan and Services Expected Discharge Plan: [place] Choice: [place] arrangements for the past 2 months: [place] Arranged: PT,OT,RN,Nurse's Aide [facility] Agency: [place] ([place]) Date [place]: 10/31/20 Time [place]: 1100 Representative spoke with at [place]: Why (SDOH) Interventions    Readmission Risk Interventions No flowsheet data found.

## 2020-11-12 DIAGNOSIS — J9621 Acute and chronic respiratory failure with hypoxia: Secondary | ICD-10-CM | POA: Diagnosis not present

## 2020-11-12 LAB — GLUCOSE, CAPILLARY
Glucose-Capillary: 182 mg/dL — ABNORMAL HIGH (ref 70–99)
Glucose-Capillary: 200 mg/dL — ABNORMAL HIGH (ref 70–99)
Glucose-Capillary: 239 mg/dL — ABNORMAL HIGH (ref 70–99)
Glucose-Capillary: 263 mg/dL — ABNORMAL HIGH (ref 70–99)
Glucose-Capillary: 187 mg/dL — ABNORMAL HIGH (ref 70–99)

## 2020-11-12 MED ORDER — FUROSEMIDE 40 MG PO TABS
40.0000 mg | ORAL_TABLET | Freq: Two times a day (BID) | ORAL | Status: DC
  Administered 2020-11-12 – 2020-11-18 (×12): 40 mg via ORAL
  Filled 2020-11-12 (×12): qty 1

## 2020-11-12 NOTE — Progress Notes (Signed)
[facility] [place] NAME: Seryna Marek    MR#:  [id number]  DATE OF BIRTH:  04-11-1934  SUBJECTIVE:  patient feels better today. constipation Patient earlier ate good breakfast.  Sugars up in the 200's  REVIEW OF SYSTEMS:   Review of Systems  Constitutional: Negative for chills, fever and weight loss.  HENT: Negative for ear discharge, ear pain and nosebleeds.   Eyes: Negative for blurred vision, pain and discharge.  Respiratory: Negative for sputum production, shortness of breath, wheezing and stridor.   Cardiovascular: Negative for chest pain, palpitations, orthopnea and PND.  Gastrointestinal: Negative for abdominal pain, diarrhea, nausea and vomiting.  Genitourinary: Negative for frequency and urgency.  Musculoskeletal: Negative for back pain and joint pain.  Neurological: Negative for sensory change, speech change, focal weakness and weakness.  Psychiatric/Behavioral: Negative for depression and hallucinations. The patient is not nervous/anxious.    Tolerating Diet:yes Tolerating PT: rehab  DRUG ALLERGIES:   Allergies  Allergen Reactions  . Aspirin Swelling and Other (See Comments)    Pt states that she has tongue swelling.    . Celebrex [Celecoxib] Anaphylaxis  . Meloxicam Swelling  . Nsaids Anaphylaxis, Other (See Comments) and Nausea And Vomiting    unknown   . Quetiapine Anaphylaxis    intolerant  . Rofecoxib Anaphylaxis  . Sulfa Antibiotics Hives, Itching, Anaphylaxis, Nausea And Vomiting and Other (See Comments)  . Tolmetin Anaphylaxis  . Bactrim [Sulfamethoxazole-Trimethoprim] Hives and Itching  . Biguanide Copolymer Other (See Comments)    Reaction:  Unknown   . Buprenorphine Hcl Other (See Comments)    Reaction:  Unknown   . Cetirizine Other (See Comments)  . Ciprofloxacin Other (See Comments)    Reaction:  Unknown   . Codeine Nausea And Vomiting and Other (See Comments)    Reaction:  Syncope   .  Dextromethorphan Hbr Other (See Comments)    Reaction:  GI upset   . Dextromethorphan Polistirex Er Other (See Comments)    Reaction:  GI upset  GI upset  . Furosemide Other (See Comments)    Reaction:  Unknown   . Hydrocodone Other (See Comments)    Reaction:  Unknown   . Metformin Other (See Comments)    Reaction:  Unknown   . Morphine And Related Other (See Comments)    Reaction:  Unknown   . Penicillins Swelling and Other (See Comments)    Pt did not answer the additional questions for this medication because she does not remember.    . Pregabalin Other (See Comments)    Reaction:  Unknown   . Promethazine Other (See Comments)    Reaction:  Unknown   . Propoxyphene Other (See Comments)    Reaction:  Unknown   . Propoxyphene Other (See Comments)  . Quinolones Other (See Comments)    Reaction:  Unknown   . Seroquel [Quetiapine Fumarate] Other (See Comments)    Reaction:  Unknown   . Serotonin Reuptake Inhibitors (Ssris) Other (See Comments)    Reaction:  Unknown   . Tramadol Nausea Only  . Ultracet [Tramadol-Acetaminophen] Other (See Comments)    Reaction:  Unknown     VITALS:  Blood pressure (!) 135/43, pulse 75, temperature 97.6 F (36.4 C), resp. rate 18, height 4\' 11"  (1.499 m), weight 121.8 kg, SpO2 97 %.  PHYSICAL EXAMINATION:   Physical Exam  GENERAL:  87 y.o.-year-old patient lying in the bed with no acute distress. morbid obesity LUNGS: Normal breath  sounds bilaterally, no wheezing, rales, rhonchi. No use of accessory muscles of respiration.  CARDIOVASCULAR: S1, S2 normal. No murmurs, rubs, or gallops.  ABDOMEN: Soft, nontender, nondistended. Bowel sounds present. No organomegaly or mass.  EXTREMITIES: chronic edema b/l.    NEUROLOGIC: Cranial nerves II through XII are intact. No focal Motor or sensory deficits b/l.  Weak/ deconditoned PSYCHIATRIC:  patient is alert and oriented x 3  SKIN: No obvious rash, lesion, or ulcer.  LABORATORY PANEL:  CBC Recent  Labs  Lab 11/11/20 0440  WBC 10.6*  HGB 9.5*  HCT 30.7*  PLT 248    Chemistries  Recent Labs  Lab 11/07/20 0554 11/08/20 0547 11/11/20 0440  NA 140   < > 145  K 4.2   < > 4.0  CL 99   < > 102  CO2 28   < > 32  GLUCOSE 233*   < > 108*  BUN 116*   < > 98*  CREATININE 1.86*   < > 1.32*  CALCIUM 9.4   < > 8.9  AST 28  --   --   ALT 18  --   --   ALKPHOS 80  --   --   BILITOT 1.0  --   --    < > = values in this interval not displayed.   Cardiac Enzymes No results for input(s): TROPONINI in the last 168 hours. RADIOLOGY:  No results found. ASSESSMENT AND PLAN:   87/F morbidly obese, chronically ill with multiple medical problems namely CAD, chronic diastolic CHF, COPD, chronic lymphedema, CKD stage III AAA, type 2 diabetes mellitus, hypertension  presented to the emergency room with worsening dyspnea on exertion over the last 1 week, intermittent chest pain, worsening lower extremity edema and productive cough. she was noted to be hypoxic with evidence of fluid overload, CTA chest was negative for PE, subsequently started on BiPAP and IV Lasix.  Acute on chronic hypoxic respiratory failure secondary to acute on chronic diastolic CHF/COPD morbid obesity with sleep apnea -- requires BiPAP nightly and intermittently -- CTA on admission consistent with fluid overload. No PE -- continue IV Lasix for ~ 10 Liters negative-- change to oral Lasix -- IV steroids switched to oral taper -- incentive spirometer, duo nebs -- patient needs constant motivation to get out of bed -- PT OT to see patient--recommends Rehab. TOC informed -- appreciate pulmonary input by Dr A --patient is clinically near baseline.  Mild hemoptysis -- hemoglobin stable -- Plavix on hold  AK I on CKD stage IIIa -- baseline creatinine 1.1 -- creatinine has been ranging 1.4 to 1.6 -- Cozaar held  Chest pain elevated troponin with history of CAD -- suspect demand ischemia -- followed by Dr. Ubaldo Glassing--  ischemic eval defer to the setting of renal insufficiency -- continue statins  History of TIA -- Plavix on hold  Type II diabetes with CKD stage IIIa -- continue 70/30 insulin and sliding scale-- dose decreased to 8 units bid due to intermittent low sugar. -- Patient tolerating PO well  Jerrye Bushy  --PPI  Chronic lymphedema/obesity  5/21--Multiple comorbidities. PT OT input appreciated  improving slowly. She is nearing baseline. She is back on all her oral medication. Patient is recommended rehab. Patient has three that offers. 5/21--Discussed with patient's son [name] regarding discharge planning. Will continue to monitor sugars over the weekend hopefully discharge on Monday if patient accepts the bed. 5/22--sugars going up. Overall stable   Procedures: Family communication :Debarah Crape on 5/21 Consults :  pulmonary, cardiology CODE STATUS: DNR DVT Prophylaxis : Level of care: Progressive Cardiac Status is: Inpatient     Dispo: The patient is from: Home              Anticipated d/c is to: SNF              Patient currently slowly improving.   Difficult to place patient No  TOC for discharge planning to rehab. Patient has to bed offers however she is declining to accept either of those. Will wait over the weekend to see if there is any bed offers and cont to monitor sugars.  TOTAL TIME TAKING CARE OF THIS PATIENT: 25 minutes.  >50% time spent on counselling and coordination of care  Note: This dictation was prepared with Dragon dictation along with smaller phrase technology. Any transcriptional errors that result from this process are unintentional.  Fritzi Mandes M.D    [facility]   CC: Primary care physician; Adin Hector, MDPatient ID: [name], female   DOB: 02/24/1934, 87 y.o.   MRN: [id number]

## 2020-11-12 NOTE — Progress Notes (Signed)
Bipap refused 

## 2020-11-12 NOTE — Progress Notes (Signed)
Pt up to bsc with max assist x 2 also using walker to stand and pivot.  Pt was successful in having a large bm

## 2020-11-12 NOTE — Progress Notes (Signed)
Occupational Therapy Treatment Patient Details Name: Teresa Krueger MRN: [id number] DOB: 08-May-1934 Today's Date: 11/12/2020    History of present illness Teresa Krueger is an 87 y/o F admitted on 11/17/2020 with c/c of SOB over the last week, pain on L side of neck & back, & BLE edema. Pt being treated for acute decompensated diastolic CHF & hypoxic respiratory failure. PMH: CAD, chronic lymphadema, COPD, CKD stage 3, HLD, HTN, DM, morbid obesity. Cardiac cath on 5/10 canceled due to concerns of kidney function.   OT comments  Teresa Krueger continues to present with limited endurance, strength, and activity tolerance that impacts her engagement in self care tasks.  Pt was pleasant and agreeable to today's session, but was limited by fatigue.  Pt declined OOB mobility, stating that she had recently completed toilet transfer with nursing and was still recovering her strength.  OT provided education re: importance of OOB mobility to support functional strength, independence, and well being.  Pt was receptive to education re: energy conservation strategies, including pursed lip breathing and planning ahead, prioritizing tasks, and pacing.  OT provided coaching for pt to identify times in her routine to incorporate rest breaks to support energy conservation.  Pt was receptive to education and demonstrates good safety awareness.  She will likely continue to benefit from skilled OT services in acute setting to address functional strengthening, independence in ADLs, and energy conservation.  SNF remains most appropriate discharge recommendation.   Follow Up Recommendations  SNF    Equipment Recommendations  None recommended by OT    Recommendations for Other Services      Precautions / Restrictions Precautions Precautions: Fall Restrictions Weight Bearing Restrictions: No       Mobility Bed Mobility                    Transfers                      Balance                                            ADL either performed or assessed with clinical judgement   ADL Overall ADL's : Needs assistance/impaired                                             Vision Baseline Vision/History: Wears glasses Wears Glasses: At all times Patient Visual Report: No change from baseline     Perception     Praxis      Cognition Arousal/Alertness: Awake/alert Behavior During Therapy: WFL for tasks assessed/performed Overall Cognitive Status: Within Functional Limits for tasks assessed                                 General Comments: pleasant and appears motivated to work with therapy, but limited by fatigue        Exercises Other Exercises Other Exercises: provided education re: importance of OOB mobility to support functional strengthening and independence, energy conservation strategies   Shoulder Instructions       General Comments      Pertinent Vitals/ Pain       Pain Assessment: No/denies pain Pain Location: does  not report any pain, but reports fatigue/shortness of breath after recent activity Pain Intervention(s): Limited activity within patient's tolerance;Monitored during session  Home Living                                          Prior Functioning/Environment              Frequency  Min 2X/week        Progress Toward Goals  OT Goals(current goals can now be found in the care plan section)  Progress towards OT goals: Progressing toward goals  Acute Rehab OT Goals Patient Stated Goal: to go home and be able to age in place OT Goal Formulation: With patient Time For Goal Achievement: 11/23/20 Potential to Achieve Goals: [place] Discharge plan remains appropriate;Frequency remains appropriate    Co-evaluation                 AM-PAC OT "6 Clicks" Daily Activity     Outcome Measure   Help from another person eating meals?: A Little Help from another  person taking care of personal grooming?: A Lot Help from another person toileting, which includes using toliet, bedpan, or urinal?: A Lot Help from another person bathing (including washing, rinsing, drying)?: A Lot Help from another person to put on and taking off regular upper body clothing?: A Little Help from another person to put on and taking off regular lower body clothing?: A Lot 6 Click Score: 14    End of Session Equipment Utilized During Treatment: Oxygen  OT Visit Diagnosis: Unsteadiness on feet (R26.81);Muscle weakness (generalized) (M62.81);Pain   Activity Tolerance Patient limited by fatigue   Patient Left in chair;with call bell/phone within reach;with chair alarm set   Nurse Communication          Time: [phone] OT Time Calculation (min): 15 min  Charges: OT General Charges $OT Visit: 1 Visit OT Treatments $Self Care/Home Management : 8-22 mins  Teresa Krueger, OTR/L 11/12/20, 4:01 PM

## 2020-11-12 NOTE — Progress Notes (Signed)
Pulmonary Medicine          Date: 11/12/2020,   MRN# [id number] Teresa Krueger 1933-10-06     AdmissionWeight: 117 kg                 CurrentWeight: 121.8 kg   Referring physician: Dr Broadus John   CHIEF COMPLAINT:   Acute on chronic hypoxemic respiratory failure   HISTORY OF PRESENT ILLNESS   Teresa Krueger is a 87 y.o. female with medical history significant of coronary artery disease, chronic lymphedema, COPD, chronic kidney disease stage III, hyperlipidemia, hypertension, diabetes and morbid obesity who was being worked up for angina at home.  Patient was supposed to have a cardiac stress test this week.  She started having chest pressure today shortness of breath.  The shortness of breath has been progressive over the last week.  Currently present even at rest without exertion.  She has pain that is been going on in the left side going to the neck and the back.  It is more pressure that is worse with exertion.  Patient also has noted progressive worsening of leg swelling.  She has cough and frothy sputum.  No fever or chills.  No sick contact.  Patient uses oxygen only at night at about 2 L.  Patient also recently was set up with a Holter monitor which she completed but the results are still pending.  She came to the ER where she was found to be hypoxic evidence of fluid overload as well as anxiety.  Patient progressively got worse in the ER and now is on BiPAP.  She is hypoxic and appears to have evidence of anasarca.  She has been admitted to the hospital for angina with acute on chronic respiratory failure with hypoxia, temperature 98 blood pressure 153/55, pulse 94 respirate of 29 oxygen sat 92% on room air.  Currently 96% on BiPAP.  Hemoglobin is 9.4.  Potassium 5.2.  BUN 63 and creatinine 1.40.  Glucose is 210.  BNP is 593.  Initial troponin 24-second troponin 25.  COVID-19 screen is currently pending.  Chest x-ray showed slight left midlung atelectasis.  No edema or  airspace opacity. Despite several days of diuresis her respiratory status is not improved and she continues to have hemoptysis with consult placed for PCCM for additional evaluation from pulmonary perspective.   -11/06/20- Vitals improved, weaning O2.  Patient diuresed >4L thus far.  Reviewed blood work, CBC is reassuring without concerning findings, metabolic profile reviewed with significant hyperglycemia, acute on chronic renal insufficiency in the context of anasarca with CHF and CKD/OHS.  Optimizing diuretic continuing Lasix will change to 40 twice daily adding Aldactone 50 daily.  Blood pressure is good with mild hypertension which will be useful while diuresing.  We will need to concentrate on decreasing additional nephrotoxins while diuresing.  Also will perform US liver to investigate steatohepatitis as additional etiology of recurrent fluid retention with anasarca.   11/07/20-patient still with hemoptysis, but its slowing down.  She is on heparin [place] for dvt ppx, she has terribly swollen ankles with tenderness unable to use SCDs.  She has severe comorbid conditions with complex decision making.  Fluid balance >5L net negative. On 5L O2  11/08/2020-patient reports clinical improvement, we discussed needing to work with physical therapy more and patient states she will attempt, hemoptysis has essentially resolved but she does have intermittent cough of blood streak expectorate.  Patient is -6-1/2 L urine output fluid balance.  She  continues to work with respiratory therapist utilizing [place] device for recruitment maneuvers.  I discussed her care plan with attending physician Dr. Posey Pronto today with short-term plan to increase physical and occupational therapy due to severe deconditioning with bibasilar atelectasis contributing to hypoxemic respiratory failure.  Repeat chest x-ray done 517 shows cardiomegaly with pulmonary edema, there is repeat transthoracic echo.  Patient is with depression post loss of  her husband recently after lung cancer battle.   11/09/20- patient is slowly improving.  She had BM overnight and feels emptied out which is helping.  She was able to get OOB to chair and is working with PT/OT.  She is negative 8L negative.  11/12/20- patient still has hemoptysis today.  She states she is not feeling well enough to leave hospital.  She is down to 5L/min and is sitting up in chair.  She is speaking less labored in full sentences.      PAST MEDICAL HISTORY   Past Medical History:  Diagnosis Date  . Arthritis   . Asthma   . Barrett's esophagus   . Breast cancer ([place]) 1999   LT MASTECTOMY  . Bronchiectasis ([place])   . COPD (chronic obstructive pulmonary disease) ([place])   . Diabetes mellitus ([place])   . Hypercholesterolemia   . Hypertension   . Lymphedema   . Mini stroke ([place])   . Osteoporosis   . Pacemaker      SURGICAL HISTORY   Past Surgical History:  Procedure Laterality Date  . CARPAL TUNNEL RELEASE     right  . CATARACT EXTRACTION     bilateral  . GALLBLADDER SURGERY    . MASTECTOMY Left 1999   BREAST CA  . PACEMAKER INSERTION Right 03/21/2015   Procedure: INSERTION PACEMAKER;  Surgeon: Isaias Cowman, MD;  Location: [facility] ORS;  Service: Cardiovascular;  Laterality: Right;  . TOTAL HIP ARTHROPLASTY  2001   left  . TRIGGER FINGER RELEASE     bilateral     FAMILY HISTORY   Family History  Problem Relation Age of Onset  . Heart disease Paternal Grandmother   . Diabetes Paternal Grandmother   . Heart disease Father   . Heart disease Mother   . Heart disease Maternal Grandmother   . Stroke Maternal Aunt   . Breast cancer Paternal Aunt 94  . Lung cancer Brother 39  . Kidney disease Cousin   . Bladder Cancer Neg Hx      SOCIAL HISTORY   Social History   Tobacco Use  . Smoking status: Former Smoker    Packs/day: 1.00    Years: 30.00    Pack years: 30.00    Types: Cigarettes    Quit date: 12/27/1992    Years since quitting: 27.8  .  Smokeless tobacco: Never Used  Substance Use Topics  . Alcohol use: No  . Drug use: No     MEDICATIONS    Home Medication:    Current Medication:  Current Facility-Administered Medications:  .  0.9 %  sodium chloride infusion, 250 mL, Intravenous, PRN, Teodoro Spray, MD .  ALPRAZolam Duanne Moron) tablet 0.5 mg, 0.5 mg, Oral, BID, 0.5 mg at 11/12/20 1008 **AND** ALPRAZolam (XANAX) tablet 0.5 mg, 0.5 mg, Oral, Daily PRN, Renda Rolls, RPH, 0.5 mg at 11/11/20 1012 .  bisacodyl (DULCOLAX) suppository 10 mg, 10 mg, Rectal, Daily PRN, Sharion Settler, NP, 10 mg at 11/12/20 1011 .  calcium carbonate (TUMS - dosed in mg elemental calcium) chewable tablet 200 mg of elemental  calcium, 1 tablet, Oral, PRN, Gala Romney L, MD, 200 mg of elemental calcium at 11/02/20 0928 .  capsaicin (ZOSTRIX) 0.025 % cream, , Topical, BID, Sidney Ace, MD, Given at 11/07/20 2131 .  chlorhexidine (PERIDEX) 0.12 % solution 15 mL, 15 mL, Mouth Rinse, BID, Jonelle Sidle, Mohammad L, MD, 15 mL at 11/11/20 2145 .  Chlorhexidine Gluconate Cloth 2 % PADS 6 each, 6 each, Topical, Q0600, Elwyn Reach, MD, 6 each at 11/09/20 0542 .  furosemide (LASIX) tablet 40 mg, 40 mg, Oral, Daily, Fritzi Mandes, MD, 40 mg at 11/12/20 1009 .  insulin aspart protamine- aspart (NOVOLOG MIX 70/30) injection 8 Units, 8 Units, Subcutaneous, BID WC, Fritzi Mandes, MD, 8 Units at 11/12/20 1009 .  levalbuterol (XOPENEX) nebulizer solution 0.63 mg, 0.63 mg, Nebulization, Q6H PRN, Ottie Glazier, MD, 0.63 mg at 11/11/20 2016 .  lidocaine (LIDODERM) 5 % 1 patch, 1 patch, Transdermal, Q24H, Sreenath, Sudheer B, MD, 1 patch at 11/11/20 0851 .  magnesium hydroxide (MILK OF MAGNESIA) suspension 30 mL, 30 mL, Oral, QHS PRN, Jonelle Sidle, Mohammad L, MD, 30 mL at 11/11/20 1336 .  MEDLINE mouth rinse, 15 mL, Mouth Rinse, q12n4p, Garba, Mohammad L, MD, 15 mL at 11/12/20 1011 .  methocarbamol (ROBAXIN) tablet 500 mg, 500 mg, Oral, Q8H PRN, Priscella Mann, Sudheer B,  MD, 500 mg at 11/18/2020 0821 .  multivitamin with minerals tablet 1 tablet, 1 tablet, Oral, QODAY, Elwyn Reach, MD, 1 tablet at 11/12/20 1009 .  mupirocin ointment (BACTROBAN) 2 %, , Topical, BID, Elwyn Reach, MD, Given at 11/12/20 1010 .  ondansetron (ZOFRAN) injection 4 mg, 4 mg, Intravenous, Q6H PRN, Garba, Mohammad L, MD .  pantoprazole (PROTONIX) EC tablet 40 mg, 40 mg, Oral, Daily, Jonelle Sidle, Mohammad L, MD, 40 mg at 11/12/20 1009 .  simethicone (MYLICON) chewable tablet 80 mg, 80 mg, Oral, QID PRN, Ralene Muskrat B, MD, 80 mg at 10/31/20 1744 .  simvastatin (ZOCOR) tablet 20 mg, 20 mg, Oral, q1600, Jonelle Sidle, Mohammad L, MD, 20 mg at 11/11/20 1659 .  sodium chloride (OCEAN) 0.65 % nasal spray 1 spray, 1 spray, Each Nare, PRN, Domenic Polite, MD .  sodium chloride flush (NS) 0.9 % injection 3 mL, 3 mL, Intravenous, Q12H, Fath, Javier Docker, MD, 3 mL at 11/12/20 1010 .  sodium chloride flush (NS) 0.9 % injection 3 mL, 3 mL, Intravenous, PRN, Teodoro Spray, MD .  spironolactone (ALDACTONE) tablet 50 mg, 50 mg, Oral, Daily, Lanney Gins, Inas Avena, MD, 50 mg at 11/12/20 1008 .  triamcinolone cream (KENALOG) 0.1 % cream, , Topical, BID, Elwyn Reach, MD, Given at 11/12/20 1010    ALLERGIES   Aspirin, Celebrex [celecoxib], Meloxicam, Nsaids, Quetiapine, Rofecoxib, Sulfa antibiotics, Tolmetin, Bactrim [sulfamethoxazole-trimethoprim], Biguanide copolymer, Buprenorphine hcl, Cetirizine, Ciprofloxacin, Codeine, Dextromethorphan hbr, Dextromethorphan polistirex er, Furosemide, Hydrocodone, Metformin, Morphine and related, Penicillins, Pregabalin, Promethazine, Propoxyphene, Propoxyphene, Quinolones, Seroquel [quetiapine fumarate], Serotonin reuptake inhibitors (ssris), Tramadol, and Ultracet [tramadol-acetaminophen]     REVIEW OF SYSTEMS    Review of Systems:  Gen:  Denies  fever, sweats, chills weigh loss  HEENT: Denies blurred vision, double vision, ear pain, eye pain, hearing loss, nose  bleeds, sore throat Cardiac:  No dizziness, chest pain or heaviness, chest tightness,edema Resp:   Denies cough or sputum porduction, shortness of breath,wheezing, hemoptysis,  Gi: Denies swallowing difficulty, stomach pain, nausea or vomiting, diarrhea, constipation, bowel incontinence Gu:  Denies bladder incontinence, burning urine Ext:   Denies Joint pain, stiffness or swelling Skin: Denies  skin  rash, easy bruising or bleeding or hives Endoc:  Denies polyuria, polydipsia , polyphagia or weight change Psych:   Denies depression, insomnia or hallucinations   Other:  All other systems negative   VS: BP (!) 135/43 (BP Location: Right Arm)   Pulse 75   Temp 97.6 F (36.4 C)   Resp 18   Ht 4\' 11"  (1.499 m)   Wt 121.8 kg   SpO2 97%   BMI 54.25 kg/m      PHYSICAL EXAM    GENERAL:NAD, no fevers, chills, no weakness no fatigue HEAD: Normocephalic, atraumatic.  EYES: Pupils equal, round, reactive to light. Extraocular muscles intact. No scleral icterus.  MOUTH: Moist mucosal membrane. Dentition intact. No abscess noted.  EAR, NOSE, THROAT: Clear without exudates. No external lesions.  NECK: Supple. No thyromegaly. No nodules. No JVD.  PULMONARY: rhonchi bilaterally and crackles  CARDIOVASCULAR: S1 and S2. Regular rate and rhythm. No murmurs, rubs, or gallops. No edema. Pedal pulses 2+ bilaterally.  GASTROINTESTINAL: Soft, nontender, nondistended. No masses. Positive bowel sounds. No hepatosplenomegaly.  MUSCULOSKELETAL: No swelling, clubbing, or edema. Range of motion full in all extremities.  NEUROLOGIC: Cranial nerves II through XII are intact. No gross focal neurological deficits. Sensation intact. Reflexes intact.  SKIN: No ulceration, lesions, rashes, or cyanosis. Skin warm and dry. Turgor intact.  PSYCHIATRIC: Mood, affect within normal limits. The patient is awake, alert and oriented x 3. Insight, judgment intact.       IMAGING    DG Chest 2 View  Result Date:  10/30/2020 CLINICAL DATA:  Shortness of breath EXAM: CHEST - 2 VIEW COMPARISON:  December 07, 2018. FINDINGS: There is no edema or airspace opacity. There is slight atelectasis in the left mid lung. Heart is upper normal in size with pacemaker leads attached to the right atrium and right ventricle. No adenopathy. There is aortic atherosclerosis. There is degenerative change in the thoracic spine. IMPRESSION: Slight left midlung atelectasis. No edema or airspace opacity. Heart upper normal in size with pacemaker leads attached to right atrium and right ventricle. Aortic Atherosclerosis (ICD10-I70.0). Electronically Signed   By: Lowella Grip III M.D.   On: 10/26/2020 17:08   CT CHEST WO CONTRAST  Result Date: 11/04/2020 CLINICAL DATA:  Respiratory failure, worsening hypoxia, CHF, concern for infectious process EXAM: CT CHEST WITHOUT CONTRAST TECHNIQUE: Multidetector CT imaging of the chest was performed following the standard protocol without IV contrast. COMPARISON:  10/28/2020 FINDINGS: Cardiovascular: Aortic atherosclerosis. Right chest multi lead pacer. Cardiomegaly. Left and right coronary artery calcifications. No pericardial effusion. Mediastinum/Nodes: No enlarged mediastinal, hilar, or axillary lymph nodes. Thyroid gland, trachea, and esophagus demonstrate no significant findings. Lungs/Pleura: Small to moderate bilateral pleural effusions and associated atelectasis or consolidation, slightly increased compared to prior examination. Very extensive heterogeneous and ground-glass airspace opacity throughout the lungs, substantially increased compared to prior examination. Mild interlobular septal thickening. Upper Abdomen: No acute abnormality. Musculoskeletal: No chest wall mass or suspicious bone lesions identified. IMPRESSION: 1. Small to moderate bilateral pleural effusions and associated atelectasis or consolidation, slightly increased compared to prior examination. 2. Very extensive heterogeneous and  ground-glass airspace opacity throughout the lungs, substantially increased compared to prior examination. Mild interlobular septal thickening. Findings are consistent with worsened multifocal infection, edema, and/or ARDS. 3. Cardiomegaly and coronary artery disease. Aortic Atherosclerosis (ICD10-I70.0). Electronically Signed   By: Eddie Candle M.D.   On: 11/04/2020 16:32   CT ANGIO CHEST PE W OR WO CONTRAST  Result Date: 10/28/2020 CLINICAL DATA:  87 year old  female with concern for pulmonary embolism. EXAM: CT ANGIOGRAPHY CHEST WITH CONTRAST TECHNIQUE: Multidetector CT imaging of the chest was performed using the standard protocol during bolus administration of intravenous contrast. Multiplanar CT image reconstructions and MIPs were obtained to evaluate the vascular anatomy. CONTRAST:  2mL OMNIPAQUE IOHEXOL 350 MG/ML SOLN COMPARISON:  Chest CT dated 02/07/2016. FINDINGS: Cardiovascular: Mild cardiomegaly. No pericardial effusion. There is coronary vascular calcification and calcification of the mitral annulus. Right pectoral pacemaker device. Advanced atherosclerotic calcification of the thoracic aorta. No aneurysmal dilatation. Evaluation of the pulmonary arteries is limited due to respiratory motion artifact. No large or central pulmonary artery embolus identified. Mediastinum/Nodes: No hilar or mediastinal adenopathy. The esophagus is grossly unremarkable. No mediastinal fluid collection. Lungs/Pleura: There are small bilateral pleural effusions with bibasilar atelectasis. Diffuse interstitial and interlobular septal prominence and patchy bilateral ground-glass opacities consistent with vascular congestion and edema. Scattered ground-glass nodularity may represent edema or pneumonia. Clinical correlation is recommended. No pneumothorax. The central airways are patent. Upper Abdomen: There is a 15 mm hypodense lesion in the right lobe of the liver. Cholecystectomy. Indeterminate subcentimeter left renal  hypodense lesion. Musculoskeletal: Osteopenia with scoliosis and degenerative changes of the spine. No acute osseous pathology. Review of the MIP images confirms the above findings. IMPRESSION: 1. No CT evidence of central pulmonary artery embolus. 2. Mild cardiomegaly with findings of CHF and small bilateral pleural effusions. Scattered ground-glass nodularity may represent edema or pneumonia. Clinical correlation is recommended. 3. Aortic Atherosclerosis (ICD10-I70.0). Electronically Signed   By: Anner Crete M.D.   On: 10/28/2020 01:14   DG Chest Port 1 View  Result Date: 11/07/2020 CLINICAL DATA:  Shortness of breath EXAM: PORTABLE CHEST 1 VIEW COMPARISON:  11/03/2020, CT chest 11/04/2020 FINDINGS: Right-sided pacing device as before. Small bilateral pleural effusions. Cardiomegaly with extensive bilateral consolidations and ground-glass opacities, worse as compared with 513. Aortic atherosclerosis. No pneumothorax IMPRESSION: Cardiomegaly with widespread consolidations and ground-glass opacities, worse as compared with 11/03/2020. Small pleural effusions. Electronically Signed   By: Donavan Foil M.D.   On: 11/07/2020 17:03   DG Chest Port 1 View  Result Date: 11/03/2020 CLINICAL DATA:  Hypoxia. EXAM: PORTABLE CHEST 1 VIEW COMPARISON:  Radiographs 11/11/2020 and 11/18/2020.  CT 10/28/2020. FINDINGS: 0934 hours. Right subclavian pacemaker leads appear unchanged over the right atrium and right ventricle. There is stable mild cardiomegaly and aortic atherosclerosis. There are progressive nodular airspace opacities in both lungs with small bilateral pleural effusions. No evidence of pneumothorax. The bones appear unchanged. IMPRESSION: Progressive nodular airspace opacities in both lungs suspicious for multifocal pneumonia, possibly viral in etiology. There may be a component of pulmonary edema as well, and small bilateral pleural effusions are present. Electronically Signed   By: Richardean Sale M.D.    On: 11/03/2020 10:04   DG Chest Port 1 View  Result Date: 10/26/2020 CLINICAL DATA:  87 year old female with wheezing. EXAM: PORTABLE CHEST 1 VIEW COMPARISON:  Chest radiograph dated 10/26/2020. FINDINGS: There is cardiomegaly with vascular congestion and edema. Pneumonia is not excluded clinical correlation recommended. Small bilateral pleural effusion. No pneumothorax. Atherosclerotic calcification of the aorta. Right pectoral pacemaker device. No acute osseous pathology. IMPRESSION: Cardiomegaly with findings of CHF. Pneumonia is not excluded. Electronically Signed   By: Anner Crete M.D.   On: 10/30/2020 02:26   ECHOCARDIOGRAM COMPLETE  Result Date: 11/17/2020    ECHOCARDIOGRAM REPORT   Patient Name:   JANIT LATTIN [facility] Date of Exam: 11/19/2020 Medical Rec #:  [id number]  Height:       59.0 in Accession #:    [id number]       Weight:       275.8 lb Date of Birth:  [birth year]        BSA:          2.114 m Patient Age:    89 years         BP:           128/75 mmHg Patient Gender: F                HR:           78 bpm. Exam Location:  [facility] Procedure: 2D Echo, Color Doppler and Cardiac Doppler Indications:     CHF- acute diastolic G31.51  History:         Patient has prior history of Echocardiogram examinations, most                  recent 03/17/2015. Pacemaker, COPD; Risk Factors:Hypertension                  and Diabetes.  Sonographer:     Sherrie Sport RDCS (AE) Referring Phys:  [place] Diagnosing Phys: Isaias Cowman MD IMPRESSIONS  1. Left ventricular ejection fraction, by estimation, is 55 to 60%. The left ventricle has normal function. The left ventricle has no regional wall motion abnormalities. Left ventricular diastolic parameters were normal.  2. Right ventricular systolic function is normal. The right ventricular size is normal.  3. The mitral valve is normal in structure. Mild to moderate mitral valve regurgitation. No evidence of mitral stenosis.  4. The aortic valve is  normal in structure. Aortic valve regurgitation is moderate. No aortic stenosis is present.  5. The inferior vena cava is normal in size with greater than 50% respiratory variability, suggesting right atrial pressure of 3 mmHg. FINDINGS  Left Ventricle: Left ventricular ejection fraction, by estimation, is 55 to 60%. The left ventricle has normal function. The left ventricle has no regional wall motion abnormalities. The left ventricular internal cavity size was normal in size. There is  no left ventricular hypertrophy. Left ventricular diastolic parameters were normal. Right Ventricle: The right ventricular size is normal. No increase in right ventricular wall thickness. Right ventricular systolic function is normal. Left Atrium: Left atrial size was normal in size. Right Atrium: Right atrial size was normal in size. Pericardium: There is no evidence of pericardial effusion. Mitral Valve: The mitral valve is normal in structure. Mild to moderate mitral valve regurgitation. No evidence of mitral valve stenosis. Tricuspid Valve: The tricuspid valve is normal in structure. Tricuspid valve regurgitation is mild . No evidence of tricuspid stenosis. Aortic Valve: The aortic valve is normal in structure. Aortic valve regurgitation is moderate. No aortic stenosis is present. Aortic valve mean gradient measures 15.7 mmHg. Aortic valve peak gradient measures 27.2 mmHg. Aortic valve area, by VTI measures  0.86 cm. Pulmonic Valve: The pulmonic valve was normal in structure. Pulmonic valve regurgitation is not visualized. No evidence of pulmonic stenosis. Aorta: The aortic root is normal in size and structure. Venous: The inferior vena cava is normal in size with greater than 50% respiratory variability, suggesting right atrial pressure of 3 mmHg. IAS/Shunts: No atrial level shunt detected by color flow Doppler.  LEFT VENTRICLE PLAX 2D LVIDd:         5.12 cm  Diastology LVIDs:         3.55 cm  LV e' medial:    3.59 cm/s LV PW:          1.40 cm  LV E/e' medial:  41.5 LV IVS:        0.99 cm  LV e' lateral:   5.87 cm/s LVOT diam:     2.00 cm  LV E/e' lateral: 25.4 LV SV:         52 LV SV Index:   25 LVOT Area:     3.14 cm  RIGHT VENTRICLE RV Basal diam:  4.07 cm RV S prime:     14.10 cm/s TAPSE (M-mode): 4.6 cm LEFT ATRIUM             Index       RIGHT ATRIUM           Index LA diam:        4.60 cm 2.18 cm/m  RA Area:     21.00 cm LA Vol (A2C):   89.2 ml 42.19 ml/m RA Volume:   60.40 ml  28.57 ml/m LA Vol (A4C):   78.7 ml 37.22 ml/m LA Biplane Vol: 83.9 ml 39.68 ml/m  AORTIC VALVE                    PULMONIC VALVE AV Area (Vmax):    0.94 cm     PV Vmax:        0.89 m/s AV Area (Vmean):   0.96 cm     PV Peak grad:   3.1 mmHg AV Area (VTI):     0.86 cm     RVOT Peak grad: 3 mmHg AV Vmax:           261.00 cm/s AV Vmean:          184.333 cm/s AV VTI:            0.606 m AV Peak Grad:      27.2 mmHg AV Mean Grad:      15.7 mmHg LVOT Vmax:         77.70 cm/s LVOT Vmean:        56.100 cm/s LVOT VTI:          0.166 m LVOT/AV VTI ratio: 0.27  AORTA Ao Root diam: 2.70 cm MITRAL VALVE                TRICUSPID VALVE MV Area (PHT): 5.58 cm     TR Peak grad:   40.7 mmHg MV Decel Time: 136 msec     TR Vmax:        319.00 cm/s MV E velocity: 149.00 cm/s MV A velocity: 127.00 cm/s  SHUNTS MV E/A ratio:  1.17         Systemic VTI:  0.17 m                             Systemic Diam: 2.00 cm Isaias Cowman MD Electronically signed by Isaias Cowman MD Signature Date/Time: 11/16/2020/1:07:34 PM    Final    US LIVER DOPPLER  Result Date: 11/09/2020 CLINICAL DATA:  Non alcoholic steatohepatitis EXAM: DUPLEX ULTRASOUND OF LIVER TECHNIQUE: Color and duplex Doppler ultrasound was performed to evaluate the hepatic in-flow and out-flow vessels. Technologist describes technically difficult study secondary to labored breathing, patient on non-rebreather mask. COMPARISON:  CT 09/10/2019 and previous FINDINGS: Liver: Normal parenchymal echogenicity.  Normal hepatic contour without nodularity. 2.4 x 1.7 x 1.4 cm simple appearing cyst in the  lateral left hepatic segment as before. Main Portal Vein size: 1.2 cm Portal Vein Velocities (all hepatopetal): Main Prox:  48 cm/sec Main Mid: 37 cm/sec Main Dist:  39 cm/sec Right: 21 cm/sec Left: 9 cm/sec Hepatic Vein Velocities (all hepatofugal): Right:  16 cm/sec Middle:  22 cm/sec Left:  25 cm/sec IVC: Present and patent with normal respiratory phasicity. Velocity 27 cm/seconds. Diameter 1.3 cm. Hepatic Artery Velocity:  58 cm/sec Splenic Vein Velocity:  26 cm/sec Spleen: 9.7 cm x 3.7 cm x 11.5 cm with a total volume of 10/23/2016 cm^3 (411 cm^3 is upper limit normal) Portal Vein Occlusion/Thrombus: No Splenic Vein Occlusion/Thrombus: No Ascites: None Varices: None IMPRESSION: 1. Unremarkable hepatic vascular Doppler evaluation. 2. Stable simple appearing cyst in the lateral left hepatic segment. Electronically Signed   By: Lucrezia Europe M.D.   On: 11/09/2020 11:38        ASSESSMENT/PLAN   Acute on chronic hypoxemic respiratory failure Multifactorial  - non massive hemoptysis, OSA/OHS, CHF, atelectasis , ckd with 3rd spaced fluid /anasarca, and protein calorie malnutrition -I dont see signs of infection at this time, will stop antibiotics  --arterial blood gas and repeat cxr -11/07/20 - decrease steroids to 30 soluemdrol once daily   Anasarca   -Most recent transthoracic echo without findings of heart failure  -US liver rule out cirrhosis secondary to NASH-negativ e -Continue diuresis adding Aldactone with Lasix 40 twice daily and albumin repletion -Meds nephrotoxins to be reduced or dcd  -patient is net 10 liters negative   Non-massive hemoptysis  - stopped eliquis   -supportive care    -will consider nebulzed tXA if necessary   Severe bibasilar atelectasis  - IS at bedside  - BID MEtaneb with saline   Bilateral pleural effusions worse on right  - continue diuresis - agree with lasix 80 bid    - decrease infusion of any volume - stopped maxipime   COPD - chornic  - due to AF - stop duonep will start xopenex only   - no need to continue pulmicort  Severe decondintiong   - aggressive PT/OT - contributing to atelectasis and respiratory failure   Moderate OSA on CPAP   - on BIPAP now   - there is skin breakdown over nasal bridge - ordered Gecko gelpad QHS interface.     Chronic atrial fibrillation  - patient is on eluqis which is being held for now, cardiology is on case.  Non-massive hemoptysis is resolving.    Chronic kidney disease Dc nephrotoxins -continue diuresis    Thank you for allowing me to participate in the care of this patient.  Total face to face encounter time for this patient visit was 40 min. >50% of the time was  spent in counseling and coordination of care.   Patient/Family are satisfied with care plan and all questions have been answered.  This document was prepared using Dragon voice recognition software and may include unintentional dictation errors.     Ottie Glazier, M.D.  Division of [facility]

## 2020-11-13 ENCOUNTER — Inpatient Hospital Stay: Payer: Medicare Other

## 2020-11-13 DIAGNOSIS — Z515 Encounter for palliative care: Secondary | ICD-10-CM | POA: Diagnosis not present

## 2020-11-13 DIAGNOSIS — J9621 Acute and chronic respiratory failure with hypoxia: Secondary | ICD-10-CM | POA: Diagnosis not present

## 2020-11-13 DIAGNOSIS — Z7189 Other specified counseling: Secondary | ICD-10-CM

## 2020-11-13 LAB — RESP PANEL BY RT-PCR (FLU A&B, COVID) ARPGX2
Influenza A by PCR: NEGATIVE
Influenza B by PCR: NEGATIVE
SARS Coronavirus 2 by RT PCR: NEGATIVE

## 2020-11-13 LAB — GLUCOSE, CAPILLARY
Glucose-Capillary: 30 mg/dL — CL (ref 70–99)
Glucose-Capillary: 169 mg/dL — ABNORMAL HIGH (ref 70–99)
Glucose-Capillary: 276 mg/dL — ABNORMAL HIGH (ref 70–99)
Glucose-Capillary: 260 mg/dL — ABNORMAL HIGH (ref 70–99)
Glucose-Capillary: 198 mg/dL — ABNORMAL HIGH (ref 70–99)

## 2020-11-13 MED ORDER — INSULIN ASPART PROT & ASPART (70-30 MIX) 100 UNIT/ML ~~LOC~~ SUSP
10.0000 [IU] | Freq: Two times a day (BID) | SUBCUTANEOUS | Status: DC
  Administered 2020-11-13 – 2020-11-17 (×9): 10 [IU] via SUBCUTANEOUS
  Filled 2020-11-13 (×10): qty 10

## 2020-11-13 MED ORDER — SPIRONOLACTONE 25 MG PO TABS
50.0000 mg | ORAL_TABLET | Freq: Two times a day (BID) | ORAL | Status: DC
  Administered 2020-11-13 – 2020-11-17 (×10): 50 mg via ORAL
  Filled 2020-11-13 (×10): qty 2

## 2020-11-13 NOTE — Consult Note (Signed)
Consultation Note Date: 11/13/2020   Patient Name: Teresa Krueger  DOB: 05/22/1934  MRN: [id number]  Age / Sex: 87 y.o., female  PCP: Adin Hector, MD Referring Physician: Fritzi Mandes, MD  Reason for Consultation: Establishing goals of care  HPI/Patient Profile: Teresa Krueger is a 87 y.o. female with medical history significant of coronary artery disease, chronic lymphedema, COPD, chronic kidney disease stage III, hyperlipidemia, hypertension, diabetes and morbid obesity who was being worked up for angina at home.The shortness of breath has been progressive over the last week.  Currently present even at rest without exertion.  She has pain that is been going on in the left side going to the neck and the back.  It is more pressure that is worse with exertion.  Patient also has noted progressive worsening of leg swelling. She came to the ER where she was found to be hypoxic evidence of fluid overload as well as anxiety.  Patient progressively got worse in the ER and now is on BiPAP.  She is hypoxic and appears to have evidence of anasarca.  She has been admitted to the hospital for angina with acute on chronic respiratory failure with hypoxia.  Clinical Assessment and Goals of Care:  Patient is sitting in bed. She states she is tired. She tells me her husband of 30 years recently died. She states she has 1 son. She begins to discuss her husband's health before death but became SOB and had to stop and rest.  She states she has a caregiver that comes into the home. She helps around the house, and gets her shower chair set up for baths. She states she herself performs her ADL's.  She uses a Rollator to walk through the house.  She does not sleep in bed due to orthopnea.  She states she is no longer able to drive.  She manages her finances.  She states that her quality of life is marginally acceptable on good days  at baseline.  We discussed her diagnoses in detail, tenuous fluid status, prognosis, GOC, EOL wishes disposition and options.  Created space and opportunity for patient  to explore thoughts and feelings regarding current medical information.   A detailed discussion was had today regarding advanced directives.  Concepts specific to code status, artifical feeding and hydration, IV antibiotics and rehospitalization were discussed.  The difference between an aggressive medical intervention path and a comfort care path was discussed.  Values and goals of care important to patient and family were attempted to be elicited.  Discussed limitations of medical interventions to prolong quality of life in some situations and discussed the concept of human mortality.  She states that if she cannot have the quality of life that she had prior she is not interested in continuing on with life prolonging care; she does not believe she will.  She states that she has had cancer in the past and is concerned about where the blood in her sputum is coming from, which has been present since  October.  She is also concerned that if she is out of breath just sitting in bed how will she be able to do rehab, and would she want to do rehab. She discusses that even with the large amount of diuresis, she remains short of breath; we discuss the fluid returning. She begins to inquire about hospice care.  All questions answered.  She request that her son come in for a family meeting tomorrow.  Spoke with her son.  He states that her quality of life has been very poor for years now.  He states that she is tired.  He would like to come in to talk in the morning about care moving forward.  We will plan to meet at 9 AM.    SUMMARY OF RECOMMENDATIONS   Meet at bedside at 9 AM tomorrow morning  Prognosis:   Poor      Primary Diagnoses: Present on Admission: . Angina at rest [facility]) . Benign essential hypertension . CKD (chronic  kidney disease) stage 3, GFR 30-59 ml/min (HCC) . GERD (gastroesophageal reflux disease) . Morbid obesity ([place]) . Acute on chronic respiratory failure with hypoxia ([place])   I have reviewed the medical record, interviewed the patient and family, and examined the patient. The following aspects are pertinent.  Past Medical History:  Diagnosis Date  . Arthritis   . Asthma   . Barrett's esophagus   . Breast cancer ([place]) 1999   LT MASTECTOMY  . Bronchiectasis ([place])   . COPD (chronic obstructive pulmonary disease) ([place])   . Diabetes mellitus ([place])   . Hypercholesterolemia   . Hypertension   . Lymphedema   . Mini stroke ([place])   . Osteoporosis   . Pacemaker    Social History   Socioeconomic History  . Marital status: Widowed    Spouse name: Not on file  . Number of children: 1  . Years of education: Not on file  . Highest education level: Not on file  Occupational History  . 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 Smoking status: Former Smoker    Packs/day: 1.00    Years: 30.00    Pack years: 30.00    Types: Cigarettes    Quit date: 12/27/1992    Years since quitting: 27.8  . Smokeless tobacco: Never Used  Substance and Sexual Activity  . Alcohol use: No  . Drug use: No  . Sexual activity: Never  Other Topics Concern  . Not on file  Social History Narrative  . Not on file   Social Determinants of Health   Financial Resource Strain: Not on file  Food Insecurity: Not on file  Transportation Needs: Not on file  Physical Activity: Not on file  Stress: Not on file  Social Connections: Not on file   Family History  Problem Relation Age of Onset  . Heart disease Paternal Grandmother   . Diabetes Paternal Grandmother   . Heart disease Father   . Heart disease Mother   . Heart disease Maternal Grandmother   . Stroke Maternal Aunt   . Breast cancer Paternal Aunt 94  . Lung cancer Brother 12  . Kidney disease Cousin   . Bladder  Cancer Neg Hx    Scheduled Meds: . ALPRAZolam  0.5 mg Oral BID  . capsaicin   Topical BID  . chlorhexidine  15 mL Mouth Rinse BID  . Chlorhexidine Gluconate Cloth  6 each Topical Q0600  .  furosemide  40 mg Oral BID  . insulin aspart protamine- aspart  10 Units Subcutaneous BID WC  . lidocaine  1 patch Transdermal Q24H  . mouth rinse  15 mL Mouth Rinse q12n4p  . multivitamin with minerals  1 tablet Oral QODAY  . mupirocin ointment   Topical BID  . pantoprazole  40 mg Oral Daily  . simvastatin  20 mg Oral q1600  . sodium chloride flush  3 mL Intravenous Q12H  . spironolactone  50 mg Oral BID  . triamcinolone cream   Topical BID   Continuous Infusions: . sodium chloride     PRN Meds:.sodium chloride, ALPRAZolam **AND** ALPRAZolam, bisacodyl, levalbuterol, magnesium hydroxide, ondansetron (ZOFRAN) IV, simethicone, sodium chloride, sodium chloride flush Medications Prior to Admission:  Prior to Admission medications   Medication Sig Start Date End Date Taking? Authorizing Provider  acetaminophen (TYLENOL) 650 MG CR tablet Take 650 mg by mouth every morning.   Yes [provider]  ALPRAZolam (XANAX) 0.5 MG tablet Take 1 tablet (0.5 mg total) by mouth 3 (three) times daily as needed for anxiety. Patient taking differently: Take 0.5 mg by mouth 3 (three) times daily as needed for anxiety. Take 1 tablet (0.5MG ) by mouth twice daily - may take a third tablet daily if needed for anxiety 03/22/15  Yes Adin Hector, MD  AMBULATORY NON FORMULARY MEDICATION Medication Name: incentive spirometry use 10-15 times daily. Dx:J47.9 10/14/16  Yes Kasa, Maretta Bees, MD  budesonide (PULMICORT) 0.5 MG/2ML nebulizer solution SMARTSIG:1 Vial(s) Via Nebulizer Every Evening 01/04/20  Yes [provider]  calcium carbonate (TUMS - DOSED IN MG ELEMENTAL CALCIUM) 500 MG chewable tablet Chew 1 tablet by mouth as needed for indigestion or heartburn.   Yes [provider]  clopidogrel (PLAVIX)  75 MG tablet Take 1 tablet (75 mg total) by mouth daily. 10/16/16  Yes Fritzi Mandes, MD  insulin lispro protamine-lispro (HUMALOG 75/25 MIX) (75-25) 100 UNIT/ML SUSP injection Inject 15 Units into the skin 2 (two) times daily. Patient taking differently: Inject 10 Units into the skin 2 (two) times daily. Inject up to 15u under the skin twice daily 03/22/15  Yes Tama High III, MD  KLOR-CON M20 20 MEQ tablet Take 20 mEq by mouth daily.   Yes [provider]  losartan (COZAAR) 25 MG tablet Take 25 mg by mouth daily.   Yes [provider]  Magnesium Hydroxide (MILK OF MAGNESIA PO) Take by mouth as needed.   Yes [provider]  Multiple Vitamin (MULTIVITAMIN WITH MINERALS) TABS tablet Take 1 tablet by mouth every other day.   Yes [provider]  mupirocin ointment (BACTROBAN) 2 %  01/05/20  Yes [provider]  pantoprazole (PROTONIX) 40 MG tablet Take 1 tablet by mouth daily. 10/18/20  Yes [provider]  simvastatin (ZOCOR) 20 MG tablet Take 20 mg by mouth daily.   Yes [provider]  torsemide (DEMADEX) 20 MG tablet Take 20 mg by mouth daily.   Yes [provider]  triamcinolone cream (KENALOG) 0.1 % Apply topically 2 (two) times daily. 11/04/19  Yes [provider]  clindamycin (CLEOCIN) 300 MG capsule TAKE 2 CAPSULES BY MOUTH 1 HOUR BEFORE APPOINTMENT Patient not taking: No sig reported 09/21/19   [provider]  COVID-19 mRNA vaccine, [place], 30 MCG/0.3ML injection USE AS DIRECTED Patient not taking: Reported on Oct 30, 2020 05/30/20 05/30/21  Carlyle Basques, MD   Allergies  Allergen Reactions  . Aspirin Swelling and Other (See Comments)  Pt states that she has tongue swelling.    . Celebrex [Celecoxib] Anaphylaxis  . Meloxicam Swelling  . Nsaids Anaphylaxis, Other (See Comments) and Nausea And Vomiting    unknown   . Quetiapine Anaphylaxis    intolerant  . Rofecoxib Anaphylaxis  . Sulfa Antibiotics  Hives, Itching, Anaphylaxis, Nausea And Vomiting and Other (See Comments)  . Tolmetin Anaphylaxis  . Bactrim [Sulfamethoxazole-Trimethoprim] Hives and Itching  . Biguanide Copolymer Other (See Comments)    Reaction:  Unknown   . Buprenorphine Hcl Other (See Comments)    Reaction:  Unknown   . Cetirizine Other (See Comments)  . Ciprofloxacin Other (See Comments)    Reaction:  Unknown   . Codeine Nausea And Vomiting and Other (See Comments)    Reaction:  Syncope   . Dextromethorphan Hbr Other (See Comments)    Reaction:  GI upset   . Dextromethorphan Polistirex Er Other (See Comments)    Reaction:  GI upset  GI upset  . Furosemide Other (See Comments)    Reaction:  Unknown   . Hydrocodone Other (See Comments)    Reaction:  Unknown   . Metformin Other (See Comments)    Reaction:  Unknown   . Morphine And Related Other (See Comments)    Reaction:  Unknown   . Penicillins Swelling and Other (See Comments)    Pt did not answer the additional questions for this medication because she does not remember.    . Pregabalin Other (See Comments)    Reaction:  Unknown   . Promethazine Other (See Comments)    Reaction:  Unknown   . Propoxyphene Other (See Comments)    Reaction:  Unknown   . Propoxyphene Other (See Comments)  . Quinolones Other (See Comments)    Reaction:  Unknown   . Seroquel [Quetiapine Fumarate] Other (See Comments)    Reaction:  Unknown   . Serotonin Reuptake Inhibitors (Ssris) Other (See Comments)    Reaction:  Unknown   . Tramadol Nausea Only  . Ultracet [Tramadol-Acetaminophen] Other (See Comments)    Reaction:  Unknown    Review of Systems  Constitutional: Positive for activity change.  Respiratory: Positive for shortness of breath.     Physical Exam Pulmonary:     Comments: Becomes short of breath with talking Neurological:     Mental Status: She is alert.     Vital Signs: BP (!) 135/46 (BP Location: Right Arm)   Pulse 72   Temp 98.8 F (37.1 C)  (Oral)   Resp 16   Ht 4\' 11"  (1.499 m)   Wt 119.5 kg   SpO2 97%   BMI 53.20 kg/m  Pain Scale: 0-10 POSS *See Group Information*: S-Acceptable,Sleep, easy to arouse Pain Score: 0-No pain   SpO2: SpO2: 97 % O2 Device:SpO2: 97 % O2 Flow Rate: .O2 Flow Rate (L/min): 6 L/min  IO: Intake/output summary:   Intake/Output Summary (Last 24 hours) at 11/13/2020 1506 Last data filed at 11/13/2020 1019 Gross per 24 hour  Intake 930 ml  Output 1750 ml  Net -820 ml    LBM: Last BM Date: 11/09/20 Baseline Weight: Weight: 117 kg Most recent weight: Weight: 119.5 kg        Time In: 2:05 Time Out: 3:15 Time Total: 70 min Greater than 50%  of this time was spent counseling and coordinating care related to the above assessment and plan.  Signed by: Asencion Gowda, NP   Please contact Palliative Medicine Team phone at [phone] for  questions and concerns.  For individual provider: See Amion             

## 2020-11-13 NOTE — TOC Progression Note (Signed)
Transition of Care [facility]) - Progression Note    Patient Details  Name: Teresa Krueger MRN: [id number] Date of Birth: 06-17-1934  Transition of Care [facility]) CM/SW [place], [place] Phone Number: 11/13/2020, 1:36 PM  Clinical Narrative:     CSW spoke with Joneen Boers who reports he will discuss current bed options with patient and family, CSW asked to be informed by 4pm today.   Joneen Boers requested CSW reach out to [place] and [facility] to ensure they are not able to accept patient, CSW has reached out to both pending response.   CSW has informed Joneen Boers that plan is for discharge tomorrow.   Expected Discharge Plan: [place] Barriers to Discharge: Continued Medical Work up  Expected Discharge Plan and Services Expected Discharge Plan: [place] Choice: [place] arrangements for the past 2 months: [place] Arranged: PT,OT,RN,Nurse's Aide [facility] Agency: [place] ([place]) Date [place]: 10/31/20 Time HH Agency Contacted: 1100 Representative spoke with at [place]: [place] (SDOH) Interventions    Readmission Risk Interventions No flowsheet data found.

## 2020-11-13 NOTE — Progress Notes (Signed)
Bipap refused 

## 2020-11-13 NOTE — Progress Notes (Signed)
Pulmonary Medicine          Date: 11/13/2020,   MRN# [id number] Teresa Krueger [birth year]     AdmissionWeight: 117 kg                 CurrentWeight: 119.5 kg   Referring physician: Dr Broadus John   CHIEF COMPLAINT:   Acute on chronic hypoxemic respiratory failure   HISTORY OF PRESENT ILLNESS   Teresa Krueger is a 87 y.o. female with medical history significant of coronary artery disease, chronic lymphedema, COPD, chronic kidney disease stage III, hyperlipidemia, hypertension, diabetes and morbid obesity who was being worked up for angina at home.  Patient was supposed to have a cardiac stress test this week.  She started having chest pressure today shortness of breath.  The shortness of breath has been progressive over the last week.  Currently present even at rest without exertion.  She has pain that is been going on in the left side going to the neck and the back.  It is more pressure that is worse with exertion.  Patient also has noted progressive worsening of leg swelling.  She has cough and frothy sputum.  No fever or chills.  No sick contact.  Patient uses oxygen only at night at about 2 L.  Patient also recently was set up with a Holter monitor which she completed but the results are still pending.  She came to the ER where she was found to be hypoxic evidence of fluid overload as well as anxiety.  Patient progressively got worse in the ER and now is on BiPAP.  She is hypoxic and appears to have evidence of anasarca.  She has been admitted to the hospital for angina with acute on chronic respiratory failure with hypoxia, temperature 98 blood pressure 153/55, pulse 94 respirate of 29 oxygen sat 92% on room air.  Currently 96% on BiPAP.  Hemoglobin is 9.4.  Potassium 5.2.  BUN 63 and creatinine 1.40.  Glucose is 210.  BNP is 593.  Initial troponin 24-second troponin 25.  COVID-19 screen is currently pending.  Chest x-ray showed slight left midlung atelectasis.  No edema or  airspace opacity. Despite several days of diuresis her respiratory status is not improved and she continues to have hemoptysis with consult placed for PCCM for additional evaluation from pulmonary perspective.   -11/06/20- Vitals improved, weaning O2.  Patient diuresed >4L thus far.  Reviewed blood work, CBC is reassuring without concerning findings, metabolic profile reviewed with significant hyperglycemia, acute on chronic renal insufficiency in the context of anasarca with CHF and CKD/OHS.  Optimizing diuretic continuing Lasix will change to 40 twice daily adding Aldactone 50 daily.  Blood pressure is good with mild hypertension which will be useful while diuresing.  We will need to concentrate on decreasing additional nephrotoxins while diuresing.  Also will perform US liver to investigate steatohepatitis as additional etiology of recurrent fluid retention with anasarca.   11/07/20-patient still with hemoptysis, but its slowing down.  She is on heparin [place] for dvt ppx, she has terribly swollen ankles with tenderness unable to use SCDs.  She has severe comorbid conditions with complex decision making.  Fluid balance >5L net negative. On 5L O2  11/08/2020-patient reports clinical improvement, we discussed needing to work with physical therapy more and patient states she will attempt, hemoptysis has essentially resolved but she does have intermittent cough of blood streak expectorate.  Patient is -6-1/2 L urine output fluid balance.  She  continues to work with respiratory therapist utilizing [place] device for recruitment maneuvers.  I discussed her care plan with attending physician Dr. Posey Pronto today with short-term plan to increase physical and occupational therapy due to severe deconditioning with bibasilar atelectasis contributing to hypoxemic respiratory failure.  Repeat chest x-ray done 517 shows cardiomegaly with pulmonary edema, there is repeat transthoracic echo.  Patient is with depression post loss of  her husband recently after lung cancer battle.   11/09/20- patient is slowly improving.  She had BM overnight and feels emptied out which is helping.  She was able to get OOB to chair and is working with PT/OT.  She is negative 8L negative.  11/12/20- patient still has hemoptysis today.  She states she is not feeling well enough to leave hospital.  She is down to 5L/min and is sitting up in chair.  She is speaking less labored in full sentences.   11/13/20- patient is medically improved. She is on 5-6L/min. Patient requires additional physical therapy.  She will continue to diurese. I spoke to son Teresa Krueger today to review plan for dc tommorow.    PAST MEDICAL HISTORY   Past Medical History:  Diagnosis Date  . Arthritis   . Asthma   . Barrett's esophagus   . Breast cancer ([place]) 1999   LT MASTECTOMY  . Bronchiectasis ([place])   . COPD (chronic obstructive pulmonary disease) ([place])   . Diabetes mellitus ([place])   . Hypercholesterolemia   . Hypertension   . Lymphedema   . Mini stroke ([place])   . Osteoporosis   . Pacemaker      SURGICAL HISTORY   Past Surgical History:  Procedure Laterality Date  . CARPAL TUNNEL RELEASE     right  . CATARACT EXTRACTION     bilateral  . GALLBLADDER SURGERY    . MASTECTOMY Left 1999   BREAST CA  . PACEMAKER INSERTION Right 03/21/2015   Procedure: INSERTION PACEMAKER;  Surgeon: Isaias Cowman, MD;  Location: [facility] ORS;  Service: Cardiovascular;  Laterality: Right;  . TOTAL HIP ARTHROPLASTY  2001   left  . TRIGGER FINGER RELEASE     bilateral     FAMILY HISTORY   Family History  Problem Relation Age of Onset  . Heart disease Paternal Grandmother   . Diabetes Paternal Grandmother   . Heart disease Father   . Heart disease Mother   . Heart disease Maternal Grandmother   . Stroke Maternal Aunt   . Breast cancer Paternal Aunt 94  . Lung cancer Brother 25  . Kidney disease Cousin   . Bladder Cancer Neg Hx      SOCIAL HISTORY   Social  History   Tobacco Use  . Smoking status: Former Smoker    Packs/day: 1.00    Years: 30.00    Pack years: 30.00    Types: Cigarettes    Quit date: 12/27/1992    Years since quitting: 27.8  . Smokeless tobacco: Never Used  Substance Use Topics  . Alcohol use: No  . Drug use: No     MEDICATIONS    Home Medication:    Current Medication:  Current Facility-Administered Medications:  .  0.9 %  sodium chloride infusion, 250 mL, Intravenous, PRN, Teodoro Spray, MD .  ALPRAZolam Duanne Moron) tablet 0.5 mg, 0.5 mg, Oral, BID, 0.5 mg at 11/12/20 2205 **AND** ALPRAZolam Duanne Moron) tablet 0.5 mg, 0.5 mg, Oral, Daily PRN, Renda Rolls, RPH, 0.5 mg at 11/11/20 1012 .  bisacodyl (DULCOLAX) suppository  10 mg, 10 mg, Rectal, Daily PRN, Sharion Settler, NP, 10 mg at 11/12/20 1011 .  capsaicin (ZOSTRIX) 0.025 % cream, , Topical, BID, Sidney Ace, MD, Given at 11/07/20 2131 .  chlorhexidine (PERIDEX) 0.12 % solution 15 mL, 15 mL, Mouth Rinse, BID, Jonelle Sidle, Mohammad L, MD, 15 mL at 11/12/20 2205 .  Chlorhexidine Gluconate Cloth 2 % PADS 6 each, 6 each, Topical, Q0600, Elwyn Reach, MD, 6 each at 11/09/20 0542 .  furosemide (LASIX) tablet 40 mg, 40 mg, Oral, BID, Latroya Ng, MD, 40 mg at 11/12/20 1710 .  insulin aspart protamine- aspart (NOVOLOG MIX 70/30) injection 8 Units, 8 Units, Subcutaneous, BID WC, Fritzi Mandes, MD, 8 Units at 11/12/20 1711 .  levalbuterol (XOPENEX) nebulizer solution 0.63 mg, 0.63 mg, Nebulization, Q6H PRN, Ottie Glazier, MD, 0.63 mg at 11/12/20 2015 .  lidocaine (LIDODERM) 5 % 1 patch, 1 patch, Transdermal, Q24H, Sreenath, Sudheer B, MD, 1 patch at 11/11/20 0851 .  magnesium hydroxide (MILK OF MAGNESIA) suspension 30 mL, 30 mL, Oral, QHS PRN, Jonelle Sidle, Mohammad L, MD, 30 mL at 11/11/20 1336 .  MEDLINE mouth rinse, 15 mL, Mouth Rinse, q12n4p, Garba, Mohammad L, MD, 15 mL at 11/12/20 1011 .  multivitamin with minerals tablet 1 tablet, 1 tablet, Oral, QODAY, Elwyn Reach, MD, 1 tablet at 11/12/20 1009 .  mupirocin ointment (BACTROBAN) 2 %, , Topical, BID, Elwyn Reach, MD, Given at 11/12/20 2205 .  ondansetron (ZOFRAN) injection 4 mg, 4 mg, Intravenous, Q6H PRN, Garba, Mohammad L, MD .  pantoprazole (PROTONIX) EC tablet 40 mg, 40 mg, Oral, Daily, Jonelle Sidle, Mohammad L, MD, 40 mg at 11/12/20 1009 .  simethicone (MYLICON) chewable tablet 80 mg, 80 mg, Oral, QID PRN, Ralene Muskrat B, MD, 80 mg at 10/31/20 1744 .  simvastatin (ZOCOR) tablet 20 mg, 20 mg, Oral, q1600, Jonelle Sidle, Mohammad L, MD, 20 mg at 11/12/20 1711 .  sodium chloride (OCEAN) 0.65 % nasal spray 1 spray, 1 spray, Each Nare, PRN, Domenic Polite, MD .  sodium chloride flush (NS) 0.9 % injection 3 mL, 3 mL, Intravenous, Q12H, Teodoro Spray, MD, 3 mL at 11/12/20 2205 .  sodium chloride flush (NS) 0.9 % injection 3 mL, 3 mL, Intravenous, PRN, Teodoro Spray, MD .  spironolactone (ALDACTONE) tablet 50 mg, 50 mg, Oral, BID, Lanney Gins, Sereen Schaff, MD .  triamcinolone cream (KENALOG) 0.1 % cream, , Topical, BID, Elwyn Reach, MD, Given at 11/12/20 2205    ALLERGIES   Aspirin, Celebrex [celecoxib], Meloxicam, Nsaids, Quetiapine, Rofecoxib, Sulfa antibiotics, Tolmetin, Bactrim [sulfamethoxazole-trimethoprim], Biguanide copolymer, Buprenorphine hcl, Cetirizine, Ciprofloxacin, Codeine, Dextromethorphan hbr, Dextromethorphan polistirex er, Furosemide, Hydrocodone, Metformin, Morphine and related, Penicillins, Pregabalin, Promethazine, Propoxyphene, Propoxyphene, Quinolones, Seroquel [quetiapine fumarate], Serotonin reuptake inhibitors (ssris), Tramadol, and Ultracet [tramadol-acetaminophen]     REVIEW OF SYSTEMS    Review of Systems:  Gen:  Denies  fever, sweats, chills weigh loss  HEENT: Denies blurred vision, double vision, ear pain, eye pain, hearing loss, nose bleeds, sore throat Cardiac:  No dizziness, chest pain or heaviness, chest tightness,edema Resp:   Denies cough or sputum  porduction, shortness of breath,wheezing, hemoptysis,  Gi: Denies swallowing difficulty, stomach pain, nausea or vomiting, diarrhea, constipation, bowel incontinence Gu:  Denies bladder incontinence, burning urine Ext:   Denies Joint pain, stiffness or swelling Skin: Denies  skin rash, easy bruising or bleeding or hives Endoc:  Denies polyuria, polydipsia , polyphagia or weight change Psych:   Denies depression, insomnia or hallucinations   Other:  All other systems negative   VS: BP (!) 143/46 (BP Location: Right Wrist)   Pulse 78   Temp 98.4 F (36.9 C) (Oral)   Resp 20   Ht 4\' 11"  (1.499 m)   Wt 119.5 kg   SpO2 98%   BMI 53.20 kg/m      PHYSICAL EXAM    GENERAL:NAD, no fevers, chills, no weakness no fatigue HEAD: Normocephalic, atraumatic.  EYES: Pupils equal, round, reactive to light. Extraocular muscles intact. No scleral icterus.  MOUTH: Moist mucosal membrane. Dentition intact. No abscess noted.  EAR, NOSE, THROAT: Clear without exudates. No external lesions.  NECK: Supple. No thyromegaly. No nodules. No JVD.  PULMONARY: rhonchi bilaterally and crackles  CARDIOVASCULAR: S1 and S2. Regular rate and rhythm. No murmurs, rubs, or gallops. No edema. Pedal pulses 2+ bilaterally.  GASTROINTESTINAL: Soft, nontender, nondistended. No masses. Positive bowel sounds. No hepatosplenomegaly.  MUSCULOSKELETAL: No swelling, clubbing, or edema. Range of motion full in all extremities.  NEUROLOGIC: Cranial nerves II through XII are intact. No gross focal neurological deficits. Sensation intact. Reflexes intact.  SKIN: No ulceration, lesions, rashes, or cyanosis. Skin warm and dry. Turgor intact.  PSYCHIATRIC: Mood, affect within normal limits. The patient is awake, alert and oriented x 3. Insight, judgment intact.       IMAGING    DG Chest 2 View  Result Date: 10/24/2020 CLINICAL DATA:  Shortness of breath EXAM: CHEST - 2 VIEW COMPARISON:  December 07, 2018. FINDINGS: There is no  edema or airspace opacity. There is slight atelectasis in the left mid lung. Heart is upper normal in size with pacemaker leads attached to the right atrium and right ventricle. No adenopathy. There is aortic atherosclerosis. There is degenerative change in the thoracic spine. IMPRESSION: Slight left midlung atelectasis. No edema or airspace opacity. Heart upper normal in size with pacemaker leads attached to right atrium and right ventricle. Aortic Atherosclerosis (ICD10-I70.0). Electronically Signed   By: Lowella Grip III M.D.   On: 11/01/2020 17:08   CT CHEST WO CONTRAST  Result Date: 11/04/2020 CLINICAL DATA:  Respiratory failure, worsening hypoxia, CHF, concern for infectious process EXAM: CT CHEST WITHOUT CONTRAST TECHNIQUE: Multidetector CT imaging of the chest was performed following the standard protocol without IV contrast. COMPARISON:  10/28/2020 FINDINGS: Cardiovascular: Aortic atherosclerosis. Right chest multi lead pacer. Cardiomegaly. Left and right coronary artery calcifications. No pericardial effusion. Mediastinum/Nodes: No enlarged mediastinal, hilar, or axillary lymph nodes. Thyroid gland, trachea, and esophagus demonstrate no significant findings. Lungs/Pleura: Small to moderate bilateral pleural effusions and associated atelectasis or consolidation, slightly increased compared to prior examination. Very extensive heterogeneous and ground-glass airspace opacity throughout the lungs, substantially increased compared to prior examination. Mild interlobular septal thickening. Upper Abdomen: No acute abnormality. Musculoskeletal: No chest wall mass or suspicious bone lesions identified. IMPRESSION: 1. Small to moderate bilateral pleural effusions and associated atelectasis or consolidation, slightly increased compared to prior examination. 2. Very extensive heterogeneous and ground-glass airspace opacity throughout the lungs, substantially increased compared to prior examination. Mild  interlobular septal thickening. Findings are consistent with worsened multifocal infection, edema, and/or ARDS. 3. Cardiomegaly and coronary artery disease. Aortic Atherosclerosis (ICD10-I70.0). Electronically Signed   By: Eddie Candle M.D.   On: 11/04/2020 16:32   CT ANGIO CHEST PE W OR WO CONTRAST  Result Date: 10/28/2020 CLINICAL DATA:  87 year old female with concern for pulmonary embolism. EXAM: CT ANGIOGRAPHY CHEST WITH CONTRAST TECHNIQUE: Multidetector CT imaging of the chest was performed using the standard protocol during bolus administration  of intravenous contrast. Multiplanar CT image reconstructions and MIPs were obtained to evaluate the vascular anatomy. CONTRAST:  13mL OMNIPAQUE IOHEXOL 350 MG/ML SOLN COMPARISON:  Chest CT dated 02/07/2016. FINDINGS: Cardiovascular: Mild cardiomegaly. No pericardial effusion. There is coronary vascular calcification and calcification of the mitral annulus. Right pectoral pacemaker device. Advanced atherosclerotic calcification of the thoracic aorta. No aneurysmal dilatation. Evaluation of the pulmonary arteries is limited due to respiratory motion artifact. No large or central pulmonary artery embolus identified. Mediastinum/Nodes: No hilar or mediastinal adenopathy. The esophagus is grossly unremarkable. No mediastinal fluid collection. Lungs/Pleura: There are small bilateral pleural effusions with bibasilar atelectasis. Diffuse interstitial and interlobular septal prominence and patchy bilateral ground-glass opacities consistent with vascular congestion and edema. Scattered ground-glass nodularity may represent edema or pneumonia. Clinical correlation is recommended. No pneumothorax. The central airways are patent. Upper Abdomen: There is a 15 mm hypodense lesion in the right lobe of the liver. Cholecystectomy. Indeterminate subcentimeter left renal hypodense lesion. Musculoskeletal: Osteopenia with scoliosis and degenerative changes of the spine. No acute  osseous pathology. Review of the MIP images confirms the above findings. IMPRESSION: 1. No CT evidence of central pulmonary artery embolus. 2. Mild cardiomegaly with findings of CHF and small bilateral pleural effusions. Scattered ground-glass nodularity may represent edema or pneumonia. Clinical correlation is recommended. 3. Aortic Atherosclerosis (ICD10-I70.0). Electronically Signed   By: Anner Crete M.D.   On: 10/28/2020 01:14   DG Chest Port 1 View  Result Date: 11/07/2020 CLINICAL DATA:  Shortness of breath EXAM: PORTABLE CHEST 1 VIEW COMPARISON:  11/03/2020, CT chest 11/04/2020 FINDINGS: Right-sided pacing device as before. Small bilateral pleural effusions. Cardiomegaly with extensive bilateral consolidations and ground-glass opacities, worse as compared with 513. Aortic atherosclerosis. No pneumothorax IMPRESSION: Cardiomegaly with widespread consolidations and ground-glass opacities, worse as compared with 11/03/2020. Small pleural effusions. Electronically Signed   By: Donavan Foil M.D.   On: 11/07/2020 17:03   DG Chest Port 1 View  Result Date: 11/03/2020 CLINICAL DATA:  Hypoxia. EXAM: PORTABLE CHEST 1 VIEW COMPARISON:  Radiographs 10/29/2020 and 11-05-2020.  CT 10/28/2020. FINDINGS: 0934 hours. Right subclavian pacemaker leads appear unchanged over the right atrium and right ventricle. There is stable mild cardiomegaly and aortic atherosclerosis. There are progressive nodular airspace opacities in both lungs with small bilateral pleural effusions. No evidence of pneumothorax. The bones appear unchanged. IMPRESSION: Progressive nodular airspace opacities in both lungs suspicious for multifocal pneumonia, possibly viral in etiology. There may be a component of pulmonary edema as well, and small bilateral pleural effusions are present. Electronically Signed   By: Richardean Sale M.D.   On: 11/03/2020 10:04   DG Chest Port 1 View  Result Date: 11/20/2020 CLINICAL DATA:  87 year old female  with wheezing. EXAM: PORTABLE CHEST 1 VIEW COMPARISON:  Chest radiograph dated Nov 05, 2020. FINDINGS: There is cardiomegaly with vascular congestion and edema. Pneumonia is not excluded clinical correlation recommended. Small bilateral pleural effusion. No pneumothorax. Atherosclerotic calcification of the aorta. Right pectoral pacemaker device. No acute osseous pathology. IMPRESSION: Cardiomegaly with findings of CHF. Pneumonia is not excluded. Electronically Signed   By: Anner Crete M.D.   On: 11/08/2020 02:26   ECHOCARDIOGRAM COMPLETE  Result Date: 11/09/2020    ECHOCARDIOGRAM REPORT   Patient Name:   RIFKA RAMEY [facility] Date of Exam: 10/23/2020 Medical Rec #:  [id number]        Height:       59.0 in Accession #:    [id number]       Weight:  275.8 lb Date of Birth:  05-27-1934        BSA:          2.114 m Patient Age:    20 years         BP:           128/75 mmHg Patient Gender: F                HR:           78 bpm. Exam Location:  [facility] Procedure: 2D Echo, Color Doppler and Cardiac Doppler Indications:     CHF- acute diastolic G26.94  History:         Patient has prior history of Echocardiogram examinations, most                  recent 03/17/2015. Pacemaker, COPD; Risk Factors:Hypertension                  and Diabetes.  Sonographer:     Sherrie Sport RDCS (AE) Referring Phys:  [place] Diagnosing Phys: Isaias Cowman MD IMPRESSIONS  1. Left ventricular ejection fraction, by estimation, is 55 to 60%. The left ventricle has normal function. The left ventricle has no regional wall motion abnormalities. Left ventricular diastolic parameters were normal.  2. Right ventricular systolic function is normal. The right ventricular size is normal.  3. The mitral valve is normal in structure. Mild to moderate mitral valve regurgitation. No evidence of mitral stenosis.  4. The aortic valve is normal in structure. Aortic valve regurgitation is moderate. No aortic stenosis is present.  5. The inferior  vena cava is normal in size with greater than 50% respiratory variability, suggesting right atrial pressure of 3 mmHg. FINDINGS  Left Ventricle: Left ventricular ejection fraction, by estimation, is 55 to 60%. The left ventricle has normal function. The left ventricle has no regional wall motion abnormalities. The left ventricular internal cavity size was normal in size. There is  no left ventricular hypertrophy. Left ventricular diastolic parameters were normal. Right Ventricle: The right ventricular size is normal. No increase in right ventricular wall thickness. Right ventricular systolic function is normal. Left Atrium: Left atrial size was normal in size. Right Atrium: Right atrial size was normal in size. Pericardium: There is no evidence of pericardial effusion. Mitral Valve: The mitral valve is normal in structure. Mild to moderate mitral valve regurgitation. No evidence of mitral valve stenosis. Tricuspid Valve: The tricuspid valve is normal in structure. Tricuspid valve regurgitation is mild . No evidence of tricuspid stenosis. Aortic Valve: The aortic valve is normal in structure. Aortic valve regurgitation is moderate. No aortic stenosis is present. Aortic valve mean gradient measures 15.7 mmHg. Aortic valve peak gradient measures 27.2 mmHg. Aortic valve area, by VTI measures  0.86 cm. Pulmonic Valve: The pulmonic valve was normal in structure. Pulmonic valve regurgitation is not visualized. No evidence of pulmonic stenosis. Aorta: The aortic root is normal in size and structure. Venous: The inferior vena cava is normal in size with greater than 50% respiratory variability, suggesting right atrial pressure of 3 mmHg. IAS/Shunts: No atrial level shunt detected by color flow Doppler.  LEFT VENTRICLE PLAX 2D LVIDd:         5.12 cm  Diastology LVIDs:         3.55 cm  LV e' medial:    3.59 cm/s LV PW:         1.40 cm  LV E/e' medial:  41.5 LV  IVS:        0.99 cm  LV e' lateral:   5.87 cm/s LVOT diam:      2.00 cm  LV E/e' lateral: 25.4 LV SV:         52 LV SV Index:   25 LVOT Area:     3.14 cm  RIGHT VENTRICLE RV Basal diam:  4.07 cm RV S prime:     14.10 cm/s TAPSE (M-mode): 4.6 cm LEFT ATRIUM             Index       RIGHT ATRIUM           Index LA diam:        4.60 cm 2.18 cm/m  RA Area:     21.00 cm LA Vol (A2C):   89.2 ml 42.19 ml/m RA Volume:   60.40 ml  28.57 ml/m LA Vol (A4C):   78.7 ml 37.22 ml/m LA Biplane Vol: 83.9 ml 39.68 ml/m  AORTIC VALVE                    PULMONIC VALVE AV Area (Vmax):    0.94 cm     PV Vmax:        0.89 m/s AV Area (Vmean):   0.96 cm     PV Peak grad:   3.1 mmHg AV Area (VTI):     0.86 cm     RVOT Peak grad: 3 mmHg AV Vmax:           261.00 cm/s AV Vmean:          184.333 cm/s AV VTI:            0.606 m AV Peak Grad:      27.2 mmHg AV Mean Grad:      15.7 mmHg LVOT Vmax:         77.70 cm/s LVOT Vmean:        56.100 cm/s LVOT VTI:          0.166 m LVOT/AV VTI ratio: 0.27  AORTA Ao Root diam: 2.70 cm MITRAL VALVE                TRICUSPID VALVE MV Area (PHT): 5.58 cm     TR Peak grad:   40.7 mmHg MV Decel Time: 136 msec     TR Vmax:        319.00 cm/s MV E velocity: 149.00 cm/s MV A velocity: 127.00 cm/s  SHUNTS MV E/A ratio:  1.17         Systemic VTI:  0.17 m                             Systemic Diam: 2.00 cm Isaias Cowman MD Electronically signed by Isaias Cowman MD Signature Date/Time: 10/24/2020/1:07:34 PM    Final    US LIVER DOPPLER  Result Date: 11/09/2020 CLINICAL DATA:  Non alcoholic steatohepatitis EXAM: DUPLEX ULTRASOUND OF LIVER TECHNIQUE: Color and duplex Doppler ultrasound was performed to evaluate the hepatic in-flow and out-flow vessels. Technologist describes technically difficult study secondary to labored breathing, patient on non-rebreather mask. COMPARISON:  CT 09/10/2019 and previous FINDINGS: Liver: Normal parenchymal echogenicity. Normal hepatic contour without nodularity. 2.4 x 1.7 x 1.4 cm simple appearing cyst in the lateral left  hepatic segment as before. Main Portal Vein size: 1.2 cm Portal Vein Velocities (all hepatopetal): Main Prox:  48 cm/sec Main Mid: 37 cm/sec Main  Dist:  39 cm/sec Right: 21 cm/sec Left: 9 cm/sec Hepatic Vein Velocities (all hepatofugal): Right:  16 cm/sec Middle:  22 cm/sec Left:  25 cm/sec IVC: Present and patent with normal respiratory phasicity. Velocity 27 cm/seconds. Diameter 1.3 cm. Hepatic Artery Velocity:  58 cm/sec Splenic Vein Velocity:  26 cm/sec Spleen: 9.7 cm x 3.7 cm x 11.5 cm with a total volume of 10/23/2016 cm^3 (411 cm^3 is upper limit normal) Portal Vein Occlusion/Thrombus: No Splenic Vein Occlusion/Thrombus: No Ascites: None Varices: None IMPRESSION: 1. Unremarkable hepatic vascular Doppler evaluation. 2. Stable simple appearing cyst in the lateral left hepatic segment. Electronically Signed   By: Lucrezia Europe M.D.   On: 11/09/2020 11:38            ASSESSMENT/PLAN   Acute on chronic hypoxemic respiratory failure Multifactorial  - non massive hemoptysis, OSA/OHS, CHF, atelectasis , ckd with 3rd spaced fluid /anasarca, and protein calorie malnutrition -I dont see signs of infection at this time, will stop antibiotics  --arterial blood gas and repeat cxr -11/07/20 - decrease steroids to 30 soluemdrol once daily   Anasarca   -Most recent transthoracic echo without findings of heart failure  -US liver rule out cirrhosis secondary to NASH-negativ e -Continue diuresis adding Aldactone with Lasix 40 twice daily and albumin repletion -Meds nephrotoxins to be reduced or dcd  -patient is net 10 liters negative   Non-massive hemoptysis  - stopped eliquis   -supportive care    -will consider nebulzed tXA if necessary   Severe bibasilar atelectasis  - IS at bedside  - BID MEtaneb with saline   Bilateral pleural effusions worse on right  - continue diuresis - agree with lasix 80 bid   - decrease infusion of any volume - stopped maxipime   COPD - chornic  - due to AF - stop  duonep will start xopenex only   - no need to continue pulmicort  Severe decondintiong   - aggressive PT/OT - contributing to atelectasis and respiratory failure   Moderate OSA on CPAP   - on BIPAP now   - there is skin breakdown over nasal bridge - ordered Gecko gelpad QHS interface.     Chronic atrial fibrillation  - patient is on eluqis which is being held for now, cardiology is on case.  Non-massive hemoptysis is resolving.    Chronic kidney disease Dc nephrotoxins -continue diuresis    Thank you for allowing me to participate in the care of this patient.  Total face to face encounter time for this patient visit was 40 min. >50% of the time was  spent in counseling and coordination of care.   Patient/Family are satisfied with care plan and all questions have been answered.  This document was prepared using Dragon voice recognition software and may include unintentional dictation errors.     Ottie Glazier, M.D.  Division of [facility]

## 2020-11-13 NOTE — Progress Notes (Signed)
Teresa Krueger at [place] NAME: Teresa Krueger    MR#:  [id number]  DATE OF BIRTH:  Aug 04, 1933  SUBJECTIVE:  patient feels better today. constipation Patient earlier ate good breakfast.  Sugars up in the 200's had large BM yesterday. Patient continues to remain short of breath with minimal activity. She is feeling fatigued/tired  REVIEW OF SYSTEMS:   Review of Systems  Constitutional: Positive for malaise/fatigue. Negative for chills, fever and weight loss.  HENT: Negative for ear discharge, ear pain and nosebleeds.   Eyes: Negative for blurred vision, pain and discharge.  Respiratory: Positive for cough and shortness of breath. Negative for sputum production, wheezing and stridor.   Cardiovascular: Negative for chest pain, palpitations, orthopnea and PND.  Gastrointestinal: Negative for abdominal pain, diarrhea, nausea and vomiting.  Genitourinary: Negative for frequency and urgency.  Musculoskeletal: Positive for joint pain. Negative for back pain.  Neurological: Positive for weakness. Negative for sensory change, speech change and focal weakness.  Psychiatric/Behavioral: Negative for depression and hallucinations. The patient is not nervous/anxious.    Tolerating Diet:yes Tolerating PT: rehab  DRUG ALLERGIES:   Allergies  Allergen Reactions  . Aspirin Swelling and Other (See Comments)    Pt states that she has tongue swelling.    . Celebrex [Celecoxib] Anaphylaxis  . Meloxicam Swelling  . Nsaids Anaphylaxis, Other (See Comments) and Nausea And Vomiting    unknown   . Quetiapine Anaphylaxis    intolerant  . Rofecoxib Anaphylaxis  . Sulfa Antibiotics Hives, Itching, Anaphylaxis, Nausea And Vomiting and Other (See Comments)  . Tolmetin Anaphylaxis  . Bactrim [Sulfamethoxazole-Trimethoprim] Hives and Itching  . Biguanide Copolymer Other (See Comments)    Reaction:  Unknown   . Buprenorphine Hcl Other (See Comments)    Reaction:   Unknown   . Cetirizine Other (See Comments)  . Ciprofloxacin Other (See Comments)    Reaction:  Unknown   . Codeine Nausea And Vomiting and Other (See Comments)    Reaction:  Syncope   . Dextromethorphan Hbr Other (See Comments)    Reaction:  GI upset   . Dextromethorphan Polistirex Er Other (See Comments)    Reaction:  GI upset  GI upset  . Furosemide Other (See Comments)    Reaction:  Unknown   . Hydrocodone Other (See Comments)    Reaction:  Unknown   . Metformin Other (See Comments)    Reaction:  Unknown   . Morphine And Related Other (See Comments)    Reaction:  Unknown   . Penicillins Swelling and Other (See Comments)    Pt did not answer the additional questions for this medication because she does not remember.    . Pregabalin Other (See Comments)    Reaction:  Unknown   . Promethazine Other (See Comments)    Reaction:  Unknown   . Propoxyphene Other (See Comments)    Reaction:  Unknown   . Propoxyphene Other (See Comments)  . Quinolones Other (See Comments)    Reaction:  Unknown   . Seroquel [Quetiapine Fumarate] Other (See Comments)    Reaction:  Unknown   . Serotonin Reuptake Inhibitors (Ssris) Other (See Comments)    Reaction:  Unknown   . Tramadol Nausea Only  . Ultracet [Tramadol-Acetaminophen] Other (See Comments)    Reaction:  Unknown     VITALS:  Blood pressure (!) 135/46, pulse 72, temperature 98.8 F (37.1 C), temperature source Oral, resp. rate 16, height 4\' 11"  (1.499 m), weight  119.5 kg, SpO2 97 %.  PHYSICAL EXAMINATION:   Physical Exam  GENERAL:  87 y.o.-year-old patient lying in the bed with no acute distress. morbid obesity LUNGSdecreased breath sounds bilaterally, no wheezing, rales, rhonchi. No use of accessory muscles of respiration.  CARDIOVASCULAR: S1, S2 normal. No murmurs, rubs, or gallops.  ABDOMEN: Soft, nontender, nondistended. Bowel sounds present. No organomegaly or mass.  EXTREMITIES: chronic edema b/l.    NEUROLOGIC: Cranial  nerves II through XII are intact. No focal Motor or sensory deficits b/l.  Weak/ deconditoned PSYCHIATRIC:  patient is alert and oriented x 3  SKIN: No obvious rash, lesion, or ulcer.  LABORATORY PANEL:  CBC Recent Labs  Lab 11/11/20 0440  WBC 10.6*  HGB 9.5*  HCT 30.7*  PLT 248    Chemistries  Recent Labs  Lab 11/07/20 0554 11/08/20 0547 11/11/20 0440  NA 140   < > 145  K 4.2   < > 4.0  CL 99   < > 102  CO2 28   < > 32  GLUCOSE 233*   < > 108*  BUN 116*   < > 98*  CREATININE 1.86*   < > 1.32*  CALCIUM 9.4   < > 8.9  AST 28  --   --   ALT 18  --   --   ALKPHOS 80  --   --   BILITOT 1.0  --   --    < > = values in this interval not displayed.   Cardiac Enzymes No results for input(s): TROPONINI in the last 168 hours. RADIOLOGY:  DG Chest Port 1 View  Result Date: 11/13/2020 CLINICAL DATA:  87 year old female with shortness of breath. EXAM: PORTABLE CHEST 1 VIEW COMPARISON:  11/07/2020 portable chest and earlier. FINDINGS: Portable AP semi upright view at 0808 hours. Stable lung volumes and mediastinal contours. Stable right chest cardiac pacemaker. Calcified aortic atherosclerosis. Visualized tracheal air column is within normal limits. No pneumothorax. Small bilateral pleural effusions as seen by CT 11/04/2020 suspected to persist. Mildly improved widespread patchy and confluent bilateral pulmonary opacity, ventilation most improved in the left upper lung. No areas of worsening ventilation. No acute osseous abnormality identified. IMPRESSION: Mildly improved ventilation since 11/04/2020, especially in the left upper lung. Appearance remains indeterminate for asymmetric edema versus bilateral pneumonia versus ARDS. Small bilateral pleural effusions likely persist. Electronically Signed   By: Teresa Krueger M.D.   On: 11/13/2020 08:45   ASSESSMENT AND PLAN:   87/F morbidly obese, chronically ill with multiple medical problems namely CAD, chronic diastolic CHF, COPD, chronic  lymphedema, CKD stage III AAA, type 2 diabetes mellitus, hypertension  presented to the emergency room with worsening dyspnea on exertion over the last 1 week, intermittent chest pain, worsening lower extremity edema and productive cough. she was noted to be hypoxic with evidence of fluid overload, CTA chest was negative for PE, subsequently started on BiPAP and IV Lasix.  Acute on chronic hypoxic respiratory failure secondary to acute on chronic diastolic CHF/COPD morbid obesity with sleep apnea -- requires BiPAP nightly and intermittently -- CTA on admission consistent with fluid overload. No PE -- continue IV Lasix for ~ 12 Liters negative-- change to oral Lasix -- IV steroids switched to oral taper -- incentive spirometer, duo nebs -- patient needs constant motivation to get out of bed -- PT OT to see patient--recommends Rehab. TOC informed -- appreciate pulmonary input by Dr A -- CXR today and previous cxr during  This admission shows  patchy infiltrate with bilateral pleural effusion cellulitis is possible ARDS like findings. -- CT chest confirms above -- get short winded with mild to moderate exertion.  Mild hemoptysis -- hemoglobin stable -- Plavix on hold  AK I on CKD stage IIIa -- baseline creatinine 1.1 -- creatinine has been ranging 1.4 to 1.6 -- Cozaar held  Chest pain elevated troponin with history of CAD -- suspect demand ischemia -- followed by Dr. Ubaldo Glassing-- ischemic eval defer to the setting of renal insufficiency -- continue statins  History of TIA -- Plavix on hold  Type II diabetes with CKD stage IIIa -- continue 70/30 insulin and sliding scale-- dose decreased to 10 units bid due to intermittent low sugar. -- Patient tolerating PO well  Jerrye Bushy  --PPI  Chronic lymphedema/obesity  5/21--Multiple comorbidities. PT OT input appreciated  improving slowly. She is nearing baseline. She is back on all her oral medication. Patient is recommended rehab. Patient has  three that offers. 5/21--Discussed with patient's son [name] regarding discharge planning. Will continue to monitor sugars over the weekend hopefully discharge on Monday if patient accepts the bed. 5/22--sugars going up. Overall stable 5/23-- discussed with pulmonology. Recommend palliative care consultation. Patient was seen by palliative care plans to meet with patient and son tomorrow. Overall declining condition in the last several months.   Procedures: Family communication :Debarah Crape on 5/21 Consults : pulmonary, cardiology CODE STATUS: DNR DVT Prophylaxis : Level of care: Progressive Cardiac Status is: Inpatient     Dispo: The patient is from: Home              Anticipated d/c is to: TBD              Patient currently slowly improving.   Difficult to place patient No  TOTAL TIME TAKING CARE OF THIS PATIENT: 25 minutes.  >50% time spent on counselling and coordination of care  Note: This dictation was prepared with Dragon dictation along with smaller phrase technology. Any transcriptional errors that result from this process are unintentional.  Fritzi Mandes M.D    [facility]   CC: Primary care physician; Adin Hector, MDPatient ID: [name], female   DOB: [birth year], 87 y.o.   MRN: [id number]

## 2020-11-14 ENCOUNTER — Encounter: Payer: Medicare Other | Admitting: Occupational Therapy

## 2020-11-14 DIAGNOSIS — Z515 Encounter for palliative care: Secondary | ICD-10-CM | POA: Diagnosis not present

## 2020-11-14 DIAGNOSIS — J9621 Acute and chronic respiratory failure with hypoxia: Secondary | ICD-10-CM | POA: Diagnosis not present

## 2020-11-14 DIAGNOSIS — Z7189 Other specified counseling: Secondary | ICD-10-CM | POA: Diagnosis not present

## 2020-11-14 LAB — GLUCOSE, CAPILLARY
Glucose-Capillary: 172 mg/dL — ABNORMAL HIGH (ref 70–99)
Glucose-Capillary: 244 mg/dL — ABNORMAL HIGH (ref 70–99)
Glucose-Capillary: 236 mg/dL — ABNORMAL HIGH (ref 70–99)

## 2020-11-14 MED ORDER — ONDANSETRON 4 MG PO TBDP
4.0000 mg | ORAL_TABLET | Freq: Four times a day (QID) | ORAL | Status: DC | PRN
  Filled 2020-11-14: qty 1

## 2020-11-14 MED ORDER — GLYCOPYRROLATE 0.2 MG/ML IJ SOLN
0.2000 mg | INTRAMUSCULAR | Status: DC | PRN
Start: 2020-11-14 — End: 2020-11-21
  Filled 2020-11-14: qty 1

## 2020-11-14 MED ORDER — GLYCOPYRROLATE 1 MG PO TABS
1.0000 mg | ORAL_TABLET | ORAL | Status: DC | PRN
Start: 2020-11-14 — End: 2020-11-21
  Filled 2020-11-14: qty 1

## 2020-11-14 MED ORDER — HALOPERIDOL 0.5 MG PO TABS
0.5000 mg | ORAL_TABLET | ORAL | Status: DC | PRN
  Filled 2020-11-14: qty 1

## 2020-11-14 MED ORDER — BIOTENE DRY MOUTH MT LIQD: 15.0000 mL | OROMUCOSAL | Status: DC | PRN

## 2020-11-14 MED ORDER — LORAZEPAM 2 MG/ML IJ SOLN
1.0000 mg | INTRAMUSCULAR | Status: DC | PRN
  Administered 2020-11-18 – 2020-11-20 (×3): 1 mg via INTRAVENOUS
  Filled 2020-11-14 (×3): qty 1

## 2020-11-14 MED ORDER — LORAZEPAM 1 MG PO TABS
1.0000 mg | ORAL_TABLET | ORAL | Status: DC | PRN
Start: 2020-11-14 — End: 2020-11-21

## 2020-11-14 MED ORDER — ONDANSETRON HCL 4 MG/2ML IJ SOLN: 4.0000 mg | Freq: Four times a day (QID) | INTRAMUSCULAR | Status: DC | PRN

## 2020-11-14 MED ORDER — LORAZEPAM 2 MG/ML PO CONC
1.0000 mg | ORAL | Status: DC | PRN
Start: 2020-11-14 — End: 2020-11-21

## 2020-11-14 MED ORDER — MORPHINE SULFATE (PF) 2 MG/ML IV SOLN
1.0000 mg | INTRAVENOUS | Status: DC | PRN
  Administered 2020-11-16 – 2020-11-18 (×5): 1 mg via INTRAVENOUS
  Filled 2020-11-14 (×5): qty 1

## 2020-11-14 MED ORDER — HALOPERIDOL LACTATE 2 MG/ML PO CONC
0.5000 mg | ORAL | Status: DC | PRN
  Filled 2020-11-14: qty 0.3

## 2020-11-14 MED ORDER — ACETAMINOPHEN 325 MG PO TABS: 650.0000 mg | ORAL_TABLET | Freq: Four times a day (QID) | ORAL | Status: DC | PRN

## 2020-11-14 MED ORDER — HALOPERIDOL LACTATE 5 MG/ML IJ SOLN
0.5000 mg | INTRAMUSCULAR | Status: DC | PRN
Start: 2020-11-14 — End: 2020-11-21

## 2020-11-14 MED ORDER — POLYVINYL ALCOHOL 1.4 % OP SOLN
1.0000 [drp] | Freq: Four times a day (QID) | OPHTHALMIC | Status: DC | PRN
  Filled 2020-11-14: qty 15

## 2020-11-14 MED ORDER — ACETAMINOPHEN 650 MG RE SUPP: 650.0000 mg | Freq: Four times a day (QID) | RECTAL | Status: DC | PRN

## 2020-11-14 NOTE — Progress Notes (Addendum)
Daily Progress Note   Patient Name: Teresa Krueger       Date: 11/14/2020 DOB: 08-Oct-1933  Age: 87 y.o. MRN#: [id number] Attending Physician: Fritzi Mandes, MD Primary Care Physician: Adin Hector, MD Admit Date: 11/04/2020  Reason for Consultation/Follow-up: Establishing goals of care  Subjective: Patient is sitting in bed. Son is at bedside.   We discussed her diagnoses, prognosis, GOC, EOL wishes disposition and options.  Created space and opportunity for patient  to explore thoughts and feelings regarding current medical information. Patient states she is weak and tired. Patient speaks a few sentences at a time and then stops to take a breath.    A detailed discussion was had today regarding advanced directives.  Concepts specific to code status, artifical feeding and hydration, IV antibiotics and rehospitalization were discussed.  The difference between an aggressive medical intervention path and a comfort care path was discussed.  Values and goals of care important to patient and family were attempted to be elicited.  Discussed limitations of medical interventions to prolong quality of life in some situations and discussed the concept of human mortality. We discussed previous, current, and anticpated QOL.   Discussed multiple scenarios none of which would provide an acceptable QOL. Rediscussed scenarios for understanding. DIL called by son to discuss options. All questions answered.  Dicussed glucose control in detail. Discussed stopping glucose checks and insulin. Discussed BIPAP which she states she will not wear. Discussed symptom management. She states she is an anxious person by nature and wants to be sure medication is available for anxiety. Discussed use of Morphine for SOB-  discussed this medication specifically as she has many medications listed under the allergies section of her chart. She is amenable to this.    Patient states she wants to focus on comfort moving forward. She does not feel that she can or would want to do rehab. She would like hospice facility placement.  I completed a MOST form today with patient. It was signed by son as patient stated she did not have the energy to sign. The signed original was placed in the chart. A photocopy was placed in the chart to be scanned into EMR. The patient outlined their wishes for the following treatment decisions:  Cardiopulmonary Resuscitation: Do Not Attempt Resuscitation (DNR/No CPR)  Medical Interventions:  Comfort Measures: Keep clean, warm, and dry. Use medication by any route, positioning, wound care, and other measures to relieve pain and suffering. Use oxygen, suction and manual treatment of airway obstruction as needed for comfort. Do not transfer to the hospital unless comfort needs cannot be met in current location.  Antibiotics: No antibiotics (use other measures to relieve symptoms)  IV Fluids: No IV fluids (provide other measures to ensure comfort)  Feeding Tube: No feeding tube    Length of Stay: 18  Current Medications: Scheduled Meds:  . ALPRAZolam  0.5 mg Oral BID  . capsaicin   Topical BID  . chlorhexidine  15 mL Mouth Rinse BID  . Chlorhexidine Gluconate Cloth  6 each Topical Q0600  . furosemide  40 mg Oral BID  . insulin aspart protamine- aspart  10 Units Subcutaneous BID WC  . lidocaine  1 patch Transdermal Q24H  . mouth rinse  15 mL Mouth Rinse q12n4p  . multivitamin with minerals  1 tablet Oral QODAY  . mupirocin ointment   Topical BID  . pantoprazole  40 mg Oral Daily  . simvastatin  20 mg Oral q1600  . sodium chloride flush  3 mL Intravenous Q12H  . spironolactone  50 mg Oral BID  . triamcinolone cream   Topical BID    Continuous Infusions: . sodium chloride      PRN  Meds: sodium chloride, ALPRAZolam **AND** ALPRAZolam, bisacodyl, levalbuterol, magnesium hydroxide, ondansetron (ZOFRAN) IV, simethicone, sodium chloride, sodium chloride flush  Physical Exam Pulmonary:     Comments: On 6 lpm of O2.  Neurological:     Mental Status: She is alert.             Vital Signs: BP (!) 136/36 (BP Location: Right Wrist)   Pulse 79   Temp 97.9 F (36.6 C)   Resp 16   Ht $R'4\' 11"'gZ$  (1.499 m)   Wt 119.5 kg   SpO2 97%   BMI 53.20 kg/m  SpO2: SpO2: 97 % O2 Device: O2 Device: Nasal Cannula O2 Flow Rate: O2 Flow Rate (L/min): 5 L/min  Intake/output summary:   Intake/Output Summary (Last 24 hours) at 11/14/2020 1133 Last data filed at 11/14/2020 0559 Gross per 24 hour  Intake 240 ml  Output 600 ml  Net -360 ml   LBM: Last BM Date: 11/12/20 Baseline Weight: Weight: 117 kg Most recent weight: Weight: 119.5 kg         Patient Active Problem List   Diagnosis Date Noted  . Angina at rest [facility]) 11/20/2020  . CHF (congestive heart failure) ([place]) 11/09/2020  . Acute on chronic respiratory failure with hypoxia ([place]) 11/11/2020  . Pedal edema 05/02/2020  . Lightheadedness 03/20/2018  . Syncope 03/09/2018  . CKD (chronic kidney disease) stage 3, GFR 30-59 ml/min ([facility]) 02/14/2017  . Carotid artery stenosis 10/29/2016  . TIA (transient ischemic attack) 10/14/2016  . Chronic respiratory failure with hypoxia ([place]) 05/24/2016  . Aortic stenosis, mild 05/02/2016  . SOB (shortness of breath) on exertion 05/02/2016  . Swelling of limb 04/10/2016  . Varicose veins of leg with pain, bilateral 04/10/2016  . Lymphedema 04/10/2016  . Osteoarthritis 03/19/2016  . Osteoporosis 03/19/2016  . Coronary artery calcification seen on CAT scan 02/08/2016  . Incidental lung nodule 02/08/2016  . Thoracic aortic atherosclerosis ([place]) 02/08/2016  . S/P placement of cardiac pacemaker 03/30/2015  . Morbid obesity ([place]) 03/17/2015  . Hypersomnia with sleep apnea 03/17/2015  .  Mobitz type 2 second degree AV block  03/16/2015  . Multiple pulmonary nodules 09/13/2014  . Lung field abnormal 09/13/2014  . Barrett's esophagus 12/21/2013  . History of adenomatous polyp of colon 12/21/2013  . Hx of adenomatous colonic polyps 12/21/2013  . Benign essential hypertension 11/30/2013  . Acromioclavicular joint arthritis 11/23/2013  . Glenohumeral arthritis 11/23/2013  . Chest pain 10/21/2013  . Adult body mass index 50.0-59.9 ([place]) 02/09/2013  . Morbid obesity with BMI of 50.0-59.9, adult ([place]) 02/09/2013  . Primary localized osteoarthrosis, lower leg 02/09/2013  . Bronchiectasis ([place]) 05/19/2012  . Chronic bronchitis 12/31/2011  . Cough 12/31/2011  . GERD (gastroesophageal reflux disease) 12/31/2011  . Diabetes mellitus ([place])   . Hypertension     Palliative Care Assessment & Plan    Recommendations/Plan:  Recommend hospice facility placement.     Code Status:    Code Status Orders  (From admission, onward)         Start     Ordered   10/29/20 0808  Do not attempt resuscitation (DNR)  Continuous       Question Answer Comment  In the event of cardiac or respiratory ARREST Do not call a "code blue"   In the event of cardiac or respiratory ARREST Do not perform Intubation, CPR, defibrillation or ACLS   In the event of cardiac or respiratory ARREST Use medication by any route, position, wound care, and other measures to relive pain and suffering. May use oxygen, suction and manual treatment of airway obstruction as needed for comfort.      10/29/20 0807        Code Status History    Date Active Date Inactive Code Status Order ID Comments User Context   10/28/2020 0002 10/29/2020 0807 Full Code [id number]  Elwyn Reach, MD Inpatient   03/09/2018 1637 03/10/2018 1606 Full Code [id number]  Demetrios Loll, MD Inpatient   10/14/2016 2248 10/15/2016 1905 Full Code [id number]  Dustin Flock, MD Inpatient   03/21/2015 1609 03/22/2015 2130 Full Code [id number]  Isaias Cowman, MD Inpatient   03/16/2015 2253 03/21/2015 1609 Full Code [id number]  Hower, Aaron Mose, MD ED   Advance Care Planning Activity       Prognosis:   < 2 weeks CHF/COPD, increased need for O2 despite aggressive diuresis. Refuses BIPAP. DM      Care plan was discussed with attending MD.   Thank you for allowing the Palliative Medicine Team to assist in the care of this patient.   Time In: 9:00 Time Out: 10:05 Total Time 65 min Prolonged Time Billed  no       Greater than 50%  of this time was spent counseling and coordinating care related to the above assessment and plan.  Asencion Gowda, NP  Please contact Palliative Medicine Team phone at [phone] for questions and concerns.

## 2020-11-14 NOTE — Progress Notes (Signed)
OT Cancellation Note  Patient Details Name: Teresa Krueger MRN: [id number] DOB: March 31, 1934   Cancelled Treatment:    Reason Eval/Treat Not Completed: Other (comment) Per rounds, staff reporting pt will be going on comfort care measures. OT to complete orders.   Darleen Crocker, [place], OTR/L , CBIS ascom [phone]  11/14/20, 11:29 AM   11/14/2020, 11:28 AM

## 2020-11-14 NOTE — Progress Notes (Signed)
Received request from [facility] for family/patient interest in [place]. Chart reviewed and working on speaking with family to acknowledge referral. Chart is currently under review by Hospice physician. Unfortunately Hospice Home is not able to offer a room today. Family and TOC aware, hospital liaison will follow up tomorrow or sooner if room becomes available.   Please do not hesitate to call with any Hospice related questions or concerns.  Thank you for the opportunity to participate in this patient's care.   Jhonnie Garner, Therapist, sports, [facility] Liaison [phone]

## 2020-11-14 NOTE — Progress Notes (Signed)
PT Cancellation Note  Patient Details Name: Teresa Krueger MRN: [id number] DOB: 05/15/1934   Cancelled Treatment:    Reason Eval/Treat Not Completed: Other (comment). Pt with palliative care meeting this AM. Per rounds, pt is moving forward with comfort care. Will dc therapy orders at this time. Please re-order if needs change.   Brailyn Delman 11/14/2020, 11:41 AM Greggory Stallion, PT, DPT [phone]

## 2020-11-14 NOTE — Progress Notes (Signed)
[facility] at [place] NAME: Teresa Krueger    MR#:  [id number]  DATE OF BIRTH:  Jun 09, 1934  SUBJECTIVE:  patient tells me she is very tired. She is remain short of breath all the time. Wants to continue high oxygen flow at present. Tells me her meeting with palliative care went well yesterday. Waiting to do a discussion this morning with son and palliative care. Seems a little anxious. Reassured. Eating breakfast. REVIEW OF SYSTEMS:   Review of Systems  Constitutional: Positive for malaise/fatigue. Negative for chills, fever and weight loss.  HENT: Negative for ear discharge, ear pain and nosebleeds.   Eyes: Negative for blurred vision, pain and discharge.  Respiratory: Positive for cough and shortness of breath. Negative for sputum production, wheezing and stridor.   Cardiovascular: Negative for chest pain, palpitations, orthopnea and PND.  Gastrointestinal: Negative for abdominal pain, diarrhea, nausea and vomiting.  Genitourinary: Negative for frequency and urgency.  Musculoskeletal: Positive for joint pain. Negative for back pain.  Neurological: Positive for weakness. Negative for sensory change, speech change and focal weakness.  Psychiatric/Behavioral: Negative for depression and hallucinations. The patient is not nervous/anxious.    Tolerating Diet:yes Tolerating PT: rehab  DRUG ALLERGIES:   Allergies  Allergen Reactions  . Aspirin Swelling and Other (See Comments)    Pt states that she has tongue swelling.    . Celebrex [Celecoxib] Anaphylaxis  . Meloxicam Swelling  . Nsaids Anaphylaxis, Other (See Comments) and Nausea And Vomiting    unknown   . Quetiapine Anaphylaxis    intolerant  . Rofecoxib Anaphylaxis  . Sulfa Antibiotics Hives, Itching, Anaphylaxis, Nausea And Vomiting and Other (See Comments)  . Tolmetin Anaphylaxis  . Bactrim [Sulfamethoxazole-Trimethoprim] Hives and Itching  . Biguanide Copolymer Other (See  Comments)    Reaction:  Unknown   . Buprenorphine Hcl Other (See Comments)    Reaction:  Unknown   . Cetirizine Other (See Comments)  . Ciprofloxacin Other (See Comments)    Reaction:  Unknown   . Codeine Nausea And Vomiting and Other (See Comments)    Reaction:  Syncope   . Dextromethorphan Hbr Other (See Comments)    Reaction:  GI upset   . Dextromethorphan Polistirex Er Other (See Comments)    Reaction:  GI upset  GI upset  . Furosemide Other (See Comments)    Reaction:  Unknown   . Hydrocodone Other (See Comments)    Reaction:  Unknown   . Metformin Other (See Comments)    Reaction:  Unknown   . Morphine And Related Other (See Comments)    Reaction:  Unknown   . Penicillins Swelling and Other (See Comments)    Pt did not answer the additional questions for this medication because she does not remember.    . Pregabalin Other (See Comments)    Reaction:  Unknown   . Promethazine Other (See Comments)    Reaction:  Unknown   . Propoxyphene Other (See Comments)    Reaction:  Unknown   . Propoxyphene Other (See Comments)  . Quinolones Other (See Comments)    Reaction:  Unknown   . Seroquel [Quetiapine Fumarate] Other (See Comments)    Reaction:  Unknown   . Serotonin Reuptake Inhibitors (Ssris) Other (See Comments)    Reaction:  Unknown   . Tramadol Nausea Only  . Ultracet [Tramadol-Acetaminophen] Other (See Comments)    Reaction:  Unknown     VITALS:  Blood pressure (!) 141/38, pulse  72, temperature 97.6 F (36.4 C), temperature source Oral, resp. rate 17, height 4\' 11"  (1.499 m), weight 119.5 kg, SpO2 97 %.  PHYSICAL EXAMINATION:   Physical Exam  GENERAL:  87 y.o.-year-old patient lying in the bed with no acute distress. morbid obesity LUNGSdecreased breath sounds bilaterally, no wheezing, rales, rhonchi. No use of accessory muscles of respiration.  CARDIOVASCULAR: S1, S2 normal. No murmurs, rubs, or gallops.  ABDOMEN: Soft, nontender, nondistended. Bowel sounds  present. No organomegaly or mass.  EXTREMITIES: chronic edema b/l.    NEUROLOGIC: Cranial nerves II through XII are intact. No focal Motor or sensory deficits b/l.  Weak/ deconditoned PSYCHIATRIC:  patient is alert and oriented x 3 anxious SKIN: No obvious rash, lesion, or ulcer.  LABORATORY PANEL:  CBC Recent Labs  Lab 11/11/20 0440  WBC 10.6*  HGB 9.5*  HCT 30.7*  PLT 248    Chemistries  Recent Labs  Lab 11/11/20 0440  NA 145  K 4.0  CL 102  CO2 32  GLUCOSE 108*  BUN 98*  CREATININE 1.32*  CALCIUM 8.9   Cardiac Enzymes No results for input(s): TROPONINI in the last 168 hours. RADIOLOGY:  DG Chest Port 1 View  Result Date: 11/13/2020 CLINICAL DATA:  87 year old female with shortness of breath. EXAM: PORTABLE CHEST 1 VIEW COMPARISON:  11/07/2020 portable chest and earlier. FINDINGS: Portable AP semi upright view at 0808 hours. Stable lung volumes and mediastinal contours. Stable right chest cardiac pacemaker. Calcified aortic atherosclerosis. Visualized tracheal air column is within normal limits. No pneumothorax. Small bilateral pleural effusions as seen by CT 11/04/2020 suspected to persist. Mildly improved widespread patchy and confluent bilateral pulmonary opacity, ventilation most improved in the left upper lung. No areas of worsening ventilation. No acute osseous abnormality identified. IMPRESSION: Mildly improved ventilation since 11/04/2020, especially in the left upper lung. Appearance remains indeterminate for asymmetric edema versus bilateral pneumonia versus ARDS. Small bilateral pleural effusions likely persist. Electronically Signed   By: Genevie Ann M.D.   On: 11/13/2020 08:45   ASSESSMENT AND PLAN:   87/F morbidly obese, chronically ill with multiple medical problems namely CAD, chronic diastolic CHF, COPD, chronic lymphedema, CKD stage III AAA, type 2 diabetes mellitus, hypertension  presented to the emergency room with worsening dyspnea on exertion over the last 1  week, intermittent chest pain, worsening lower extremity edema and productive cough. she was noted to be hypoxic with evidence of fluid overload, CTA chest was negative for PE, subsequently started on BiPAP and IV Lasix.  Acute on chronic hypoxic respiratory failure secondary to acute on chronic diastolic CHF/COPD/CXR? ARDS Morbid obesity with sleep apnea -- requires BiPAP nightly and intermittently -- CTA on admission consistent with fluid overload. No PE --5/24-- patient and patient's son [name] along with daughter-in-law on the phone had a long conversation with palliative care. Given multiple comorbidities and will all long term poor prognosis patient desires to be comfort care. She said go to rehab. Comfort care orders placed by palliative care. Patient is DNR DNI. -- Will continue comfort care meds. Continue Lasix for pulmonary edema.  AK I on CKD stage IIIa -- baseline creatinine 1.1 -- creatinine has been ranging 1.4 to 1.6  Chest pain elevated troponin with history of CAD -- suspect demand ischemia  History of TIA -- Plavix on hold due to hemoptysis  Type II diabetes with CKD stage IIIa  Gerd   Chronic lymphedema/obesity  5/21--Multiple comorbidities. PT OT input appreciated  improving slowly. She is nearing baseline.  She is back on all her oral medication. Patient is recommended rehab. Patient has three that offers. 5/21--Discussed with patient's son [name] regarding discharge planning. Will continue to monitor sugars over the weekend hopefully discharge on Monday if patient accepts the bed. 5/22--sugars going up. Overall stable 5/23-- discussed with pulmonology. Recommend palliative care consultation. Patient was seen by palliative care plans to meet with patient and son tomorrow. Overall declining condition in the last several months. 5/24--pt  is transitioned  to comfort care. Patient and family request hospice facility  Family communication :Debarah Crape on 5/22 Consults : pulmonary, cardiology CODE STATUS: DNR DVT Prophylaxis : Level of care: Progressive Cardiac Status is: Inpatient     Dispo: The patient is from: Home              Anticipated d/c is to: Hospice facility when bed opens up   [place]: 25 minutes.  >50% time spent on counselling and coordination of care  Note: This dictation was prepared with Dragon dictation along with smaller phrase technology. Any transcriptional errors that result from this process are unintentional.  Fritzi Mandes M.D    [facility]   CC: Primary care physician; Adin Hector, MDPatient ID: Teresa Krueger, female   DOB: August 06, 1933, 87 y.o.   MRN: [id number]

## 2020-11-14 NOTE — Care Management Important Message (Signed)
Important Message  Patient Details  Name: Teresa Krueger MRN: [id number] Date of Birth: 01-Jul-1933   Medicare Important Message Given:  Other (see comment)  Patient now transitioning to hospice services.  Medicare IM withheld at this time.   Dannette Barbara 11/14/2020, 3:43 PM

## 2020-11-15 DIAGNOSIS — Z515 Encounter for palliative care: Secondary | ICD-10-CM | POA: Diagnosis not present

## 2020-11-15 DIAGNOSIS — J9621 Acute and chronic respiratory failure with hypoxia: Secondary | ICD-10-CM | POA: Diagnosis not present

## 2020-11-15 LAB — GLUCOSE, CAPILLARY
Glucose-Capillary: 217 mg/dL — ABNORMAL HIGH (ref 70–99)
Glucose-Capillary: 269 mg/dL — ABNORMAL HIGH (ref 70–99)
Glucose-Capillary: 274 mg/dL — ABNORMAL HIGH (ref 70–99)

## 2020-11-15 NOTE — TOC Progression Note (Signed)
Transition of Care [facility]) - Progression Note    Patient Details  Name: Teresa Krueger MRN: [id number] Date of Birth: 05-Nov-1933  Transition of Care [facility]) CM/SW [place], [place] Phone Number: 11/15/2020, 8:19 AM  Clinical Narrative:     CSW spoke with Kieth Brightly with [facility], reports no hospice home beds as of this morning but she will let CSW know if this changes.   Expected Discharge Plan: [place] Barriers to Discharge: Hospice Bed not available  Expected Discharge Plan and Services Expected Discharge Plan: [place] Choice: Hospice Living arrangements for the past 2 months: [place] Arranged: PT,OT,RN,Nurse's Aide [place]: [place] ([place]) Date [place]: 10/31/20 Time HH Agency Contacted: 1100 Representative spoke with at [place]: [place] (SDOH) Interventions    Readmission Risk Interventions No flowsheet data found.

## 2020-11-15 NOTE — Progress Notes (Signed)
Daily Progress Note   Patient Name: Teresa Krueger       Date: 11/15/2020 DOB: [birth year]  Age: 87 y.o. MRN#: [id number] Attending Physician: Kathie Dike, MD Primary Care Physician: Adin Hector, MD Admit Date: 11/19/2020  Reason for Consultation/Follow-up: Terminal Care  Subjective: Patient is resting in bed with eyes closed. Even and unlabored respirations. Awaiting hospice facility placement. No changes to comfort medication regimen at this time.   Length of Stay: 19  Current Medications: Scheduled Meds:  . ALPRAZolam  0.5 mg Oral BID  . capsaicin   Topical BID  . chlorhexidine  15 mL Mouth Rinse BID  . Chlorhexidine Gluconate Cloth  6 each Topical Q0600  . furosemide  40 mg Oral BID  . insulin aspart protamine- aspart  10 Units Subcutaneous BID WC  . lidocaine  1 patch Transdermal Q24H  . mouth rinse  15 mL Mouth Rinse q12n4p  . multivitamin with minerals  1 tablet Oral QODAY  . mupirocin ointment   Topical BID  . pantoprazole  40 mg Oral Daily  . sodium chloride flush  3 mL Intravenous Q12H  . spironolactone  50 mg Oral BID  . triamcinolone cream   Topical BID    Continuous Infusions: . sodium chloride      PRN Meds: sodium chloride, acetaminophen **OR** acetaminophen, antiseptic oral rinse, bisacodyl, glycopyrrolate **OR** glycopyrrolate **OR** glycopyrrolate, haloperidol **OR** haloperidol **OR** haloperidol lactate, levalbuterol, LORazepam **OR** LORazepam **OR** LORazepam, magnesium hydroxide, morphine injection, ondansetron **OR** ondansetron (ZOFRAN) IV, polyvinyl alcohol, simethicone, sodium chloride, sodium chloride flush  Physical Exam Constitutional:      Comments: Eyes closed. Even and unlabored respirations.              Vital Signs: BP (!)  124/49 (BP Location: Right Arm)   Pulse 75   Temp 98 F (36.7 C) (Oral)   Resp 20   Ht 4\' 11"  (1.499 m)   Wt 117.9 kg   SpO2 98%   BMI 52.51 kg/m  SpO2: SpO2: 98 % O2 Device: O2 Device: Nasal Cannula O2 Flow Rate: O2 Flow Rate (L/min): 6 L/min  Intake/output summary:   Intake/Output Summary (Last 24 hours) at 11/15/2020 1624 Last data filed at 11/15/2020 1359 Gross per 24 hour  Intake 790 ml  Output 2000 ml  Net -1210 ml  LBM: Last BM Date: 11/12/20 Baseline Weight: Weight: 117 kg Most recent weight: Weight: 117.9 kg       Palliative Assessment/Data:      Patient Active Problem List   Diagnosis Date Noted  . Angina at rest [facility]) 11/06/2020  . CHF (congestive heart failure) ([place]) 11/05/2020  . Acute on chronic respiratory failure with hypoxia ([place]) 11/08/2020  . Pedal edema 05/02/2020  . Lightheadedness 03/20/2018  . Syncope 03/09/2018  . CKD (chronic kidney disease) stage 3, GFR 30-59 ml/min (HCC) 02/14/2017  . Carotid artery stenosis 10/29/2016  . TIA (transient ischemic attack) 10/14/2016  . Chronic respiratory failure with hypoxia ([place]) 05/24/2016  . Aortic stenosis, mild 05/02/2016  . SOB (shortness of breath) on exertion 05/02/2016  . Swelling of limb 04/10/2016  . Varicose veins of leg with pain, bilateral 04/10/2016  . Lymphedema 04/10/2016  . Osteoarthritis 03/19/2016  . Osteoporosis 03/19/2016  . Coronary artery calcification seen on CAT scan 02/08/2016  . Incidental lung nodule 02/08/2016  . Thoracic aortic atherosclerosis ([place]) 02/08/2016  . S/P placement of cardiac pacemaker 03/30/2015  . Morbid obesity ([place]) 03/17/2015  . Hypersomnia with sleep apnea 03/17/2015  . Mobitz type 2 second degree AV block 03/16/2015  . Multiple pulmonary nodules 09/13/2014  . Lung field abnormal 09/13/2014  . Barrett's esophagus 12/21/2013  . History of adenomatous polyp of colon 12/21/2013  . Hx of adenomatous colonic polyps 12/21/2013  . Benign essential  hypertension 11/30/2013  . Acromioclavicular joint arthritis 11/23/2013  . Glenohumeral arthritis 11/23/2013  . Chest pain 10/21/2013  . Adult body mass index 50.0-59.9 ([place]) 02/09/2013  . Morbid obesity with BMI of 50.0-59.9, adult ([place]) 02/09/2013  . Primary localized osteoarthrosis, lower leg 02/09/2013  . Bronchiectasis ([place]) 05/19/2012  . Chronic bronchitis 12/31/2011  . Cough 12/31/2011  . GERD (gastroesophageal reflux disease) 12/31/2011  . Diabetes mellitus ([place])   . Hypertension     Palliative Care Assessment & Plan    Recommendations/Plan:  Waiting for hospice bed. No changes to comfort care regimen at this time.     Code Status:    Code Status Orders  (From admission, onward)         Start     Ordered   11/14/20 1147  Do not attempt resuscitation (DNR)  Continuous       Question Answer Comment  In the event of cardiac or respiratory ARREST Do not call a "code blue"   In the event of cardiac or respiratory ARREST Do not perform Intubation, CPR, defibrillation or ACLS   In the event of cardiac or respiratory ARREST Use medication by any route, position, wound care, and other measures to relive pain and suffering. May use oxygen, suction and manual treatment of airway obstruction as needed for comfort.   Comments MOST form in chart.      11/14/20 1148        Code Status History    Date Active Date Inactive Code Status Order ID Comments User Context   10/29/2020 0807 11/14/2020 1148 DNR [id number]  Sidney Ace, MD Inpatient   10/28/2020 0002 10/29/2020 0807 Full Code [id number]  Elwyn Reach, MD Inpatient   03/09/2018 1637 03/10/2018 1606 Full Code [id number]  Demetrios Loll, MD Inpatient   10/14/2016 2248 10/15/2016 1905 Full Code [id number]  Dustin Flock, MD Inpatient   03/21/2015 1609 03/22/2015 2130 Full Code [id number]  Isaias Cowman, MD Inpatient   03/16/2015 2253 03/21/2015 1609 Full Code [id number]  Hower, Aaron Mose, MD ED  Advance Care Planning  Activity       Prognosis:   < 2 weeks    Thank you for allowing the Palliative Medicine Team to assist in the care of this patient.   Total Time 15 min Prolonged Time Billed  no      Greater than 50%  of this time was spent counseling and coordinating care related to the above assessment and plan.  Asencion Gowda, NP  Please contact Palliative Medicine Team phone at [phone] for questions and concerns.

## 2020-11-15 NOTE — Progress Notes (Signed)
[facility]) Hospital Liaison RN note:  Visited patient at bedside. Spoke with son, Joneen Boers over the phone to provide update. Unfortunately, Hospice Home is not able to offer a room today. Hospital care team is aware. [place] Liaison will continue to follow for room availability.  Please call with any hospice related questions or concerns.  Zandra Abts, RN [facility] Liaison [phone]

## 2020-11-15 NOTE — Progress Notes (Signed)
[place] at [place] NAME: Teresa Krueger    MR#:  [id number]  DATE OF BIRTH:  16-Sep-1933  SUBJECTIVE:  Continues to have shortness of breath and productive cough. Wants to leave supplemental oxygen at 6L REVIEW OF SYSTEMS:   Review of Systems  Constitutional: Positive for malaise/fatigue. Negative for chills, fever and weight loss.  HENT: Negative for ear discharge, ear pain and nosebleeds.   Eyes: Negative for blurred vision, pain and discharge.  Respiratory: Positive for cough and shortness of breath. Negative for sputum production, wheezing and stridor.   Cardiovascular: Negative for chest pain, palpitations, orthopnea and PND.  Gastrointestinal: Negative for abdominal pain, diarrhea, nausea and vomiting.  Genitourinary: Negative for frequency and urgency.  Musculoskeletal: Positive for joint pain. Negative for back pain.  Neurological: Positive for weakness. Negative for sensory change, speech change and focal weakness.  Psychiatric/Behavioral: Negative for depression and hallucinations. The patient is not nervous/anxious.    Tolerating Diet:yes Tolerating PT: rehab  DRUG ALLERGIES:   Allergies  Allergen Reactions  . Aspirin Swelling and Other (See Comments)    Pt states that she has tongue swelling.    . Celebrex [Celecoxib] Anaphylaxis  . Meloxicam Swelling  . Nsaids Anaphylaxis, Other (See Comments) and Nausea And Vomiting    unknown   . Quetiapine Anaphylaxis    intolerant  . Rofecoxib Anaphylaxis  . Sulfa Antibiotics Hives, Itching, Anaphylaxis, Nausea And Vomiting and Other (See Comments)  . Tolmetin Anaphylaxis  . Bactrim [Sulfamethoxazole-Trimethoprim] Hives and Itching  . Biguanide Copolymer Other (See Comments)    Reaction:  Unknown   . Buprenorphine Hcl Other (See Comments)    Reaction:  Unknown   . Cetirizine Other (See Comments)  . Ciprofloxacin Other (See Comments)    Reaction:  Unknown   . Codeine Nausea  And Vomiting and Other (See Comments)    Reaction:  Syncope   . Dextromethorphan Hbr Other (See Comments)    Reaction:  GI upset   . Dextromethorphan Polistirex Er Other (See Comments)    Reaction:  GI upset  GI upset  . Furosemide Other (See Comments)    Reaction:  Unknown   . Hydrocodone Other (See Comments)    Reaction:  Unknown   . Metformin Other (See Comments)    Reaction:  Unknown   . Morphine And Related Other (See Comments)    Reaction:  Unknown   . Penicillins Swelling and Other (See Comments)    Pt did not answer the additional questions for this medication because she does not remember.    . Pregabalin Other (See Comments)    Reaction:  Unknown   . Promethazine Other (See Comments)    Reaction:  Unknown   . Propoxyphene Other (See Comments)    Reaction:  Unknown   . Propoxyphene Other (See Comments)  . Quinolones Other (See Comments)    Reaction:  Unknown   . Seroquel [Quetiapine Fumarate] Other (See Comments)    Reaction:  Unknown   . Serotonin Reuptake Inhibitors (Ssris) Other (See Comments)    Reaction:  Unknown   . Tramadol Nausea Only  . Ultracet [Tramadol-Acetaminophen] Other (See Comments)    Reaction:  Unknown     VITALS:  Blood pressure (!) 126/50, pulse 70, temperature 98.2 F (36.8 C), temperature source Oral, resp. rate 20, height 4\' 11"  (1.499 m), weight 117.9 kg, SpO2 99 %.  PHYSICAL EXAMINATION:   Physical Exam  General exam: Alert, awake, oriented x  3 Respiratory system: Clear to auscultation. Respiratory effort normal. Cardiovascular system:RRR. No murmurs, rubs, gallops.    LABORATORY PANEL:  CBC Recent Labs  Lab 11/11/20 0440  WBC 10.6*  HGB 9.5*  HCT 30.7*  PLT 248    Chemistries  Recent Labs  Lab 11/11/20 0440  NA 145  K 4.0  CL 102  CO2 32  GLUCOSE 108*  BUN 98*  CREATININE 1.32*  CALCIUM 8.9   Cardiac Enzymes No results for input(s): TROPONINI in the last 168 hours. RADIOLOGY:  No results found. ASSESSMENT  AND PLAN:   87/F morbidly obese, chronically ill with multiple medical problems namely CAD, chronic diastolic CHF, COPD, chronic lymphedema, CKD stage III AAA, type 2 diabetes mellitus, hypertension  presented to the emergency room with worsening dyspnea on exertion over the last 1 week, intermittent chest pain, worsening lower extremity edema and productive cough. she was noted to be hypoxic with evidence of fluid overload, CTA chest was negative for PE, subsequently started on BiPAP and IV Lasix.  Acute on chronic hypoxic respiratory failure secondary to acute on chronic diastolic CHF/COPD/CXR? ARDS Morbid obesity with sleep apnea -- requires BiPAP nightly and intermittently -- CTA on admission consistent with fluid overload. No PE --5/24-- patient and patient's son [name] along with daughter-in-law on the phone had a long conversation with palliative care. Given multiple comorbidities and will all long term poor prognosis patient desires to be comfort care. She said go to rehab. Comfort care orders placed by palliative care. Patient is DNR DNI. -- Will continue comfort care meds. Continue Lasix for pulmonary edema.  AK I on CKD stage IIIa -- baseline creatinine 1.1 -- creatinine has been ranging 1.4 to 1.6  Chest pain elevated troponin with history of CAD -- suspect demand ischemia  History of TIA -- Plavix on hold due to hemoptysis  Type II diabetes with CKD stage IIIa  Gerd   Chronic lymphedema/obesity  5/21--Multiple comorbidities. PT OT input appreciated  improving slowly. She is nearing baseline. She is back on all her oral medication. Patient is recommended rehab. Patient has three that offers. 5/21--Discussed with patient's son [name] regarding discharge planning. Will continue to monitor sugars over the weekend hopefully discharge on Monday if patient accepts the bed. 5/22--sugars going up. Overall stable 5/23-- discussed with pulmonology. Recommend palliative  care consultation. Patient was seen by palliative care plans to meet with patient and son tomorrow. Overall declining condition in the last several months. 5/24--pt  is transitioned  to comfort care. Patient and family request hospice facility 5/25--currently awaiting a bed at residential hospice facility  Family communication :Debarah Crape on 5/22 Consults : pulmonary, cardiology CODE STATUS: DNR DVT Prophylaxis : Level of care: Progressive Cardiac Status is: Inpatient     Dispo: The patient is from: Home              Anticipated d/c is to: Hospice facility when bed opens up   [place]: 25 minutes.  >50% time spent on counselling and coordination of care  Note: This dictation was prepared with Dragon dictation along with smaller phrase technology. Any transcriptional errors that result from this process are unintentional.  Kathie Dike M.D    [facility]   CC: Primary care physician; Adin Hector, MDPatient ID: Teresa Krueger, female   DOB: Aug 22, 1933, 87 y.o.   MRN: [id number]

## 2020-11-16 ENCOUNTER — Ambulatory Visit: Payer: Medicare Other | Admitting: Family

## 2020-11-16 ENCOUNTER — Encounter: Payer: Medicare Other | Admitting: Occupational Therapy

## 2020-11-16 DIAGNOSIS — Z515 Encounter for palliative care: Secondary | ICD-10-CM | POA: Diagnosis not present

## 2020-11-16 DIAGNOSIS — J9621 Acute and chronic respiratory failure with hypoxia: Secondary | ICD-10-CM | POA: Diagnosis not present

## 2020-11-16 LAB — GLUCOSE, CAPILLARY
Glucose-Capillary: 228 mg/dL — ABNORMAL HIGH (ref 70–99)
Glucose-Capillary: 198 mg/dL — ABNORMAL HIGH (ref 70–99)

## 2020-11-16 MED ORDER — SALINE SPRAY 0.65 % NA SOLN: 1.0000 | NASAL | Status: DC | PRN

## 2020-11-16 NOTE — Progress Notes (Signed)
[facility] ([facility])  There is not a bed to offer Teresa Krueger today at [facility].  [facility] will update family and hospital staff.  Thank you, Venia Carbon RN, BSN, [place] Hospital Liaison

## 2020-11-16 NOTE — TOC Progression Note (Signed)
Transition of Care [facility]) - Progression Note    Patient Details  Name: Teresa Krueger MRN: [id number] Date of Birth: Nov 15, 1933  Transition of Care [facility]) CM/SW [place], [place] Phone Number: 11/16/2020, 8:57 AM  Clinical Narrative:     CSW called Misty with [facility], reports she is unaware of bed availability yet this morning and will keep CSW updated.   Expected Discharge Plan: [place] Barriers to Discharge: Hospice Bed not available  Expected Discharge Plan and Services Expected Discharge Plan: [place] Choice: Hospice Living arrangements for the past 2 months: [place] Arranged: PT,OT,RN,Nurse's Aide [place]: [place] ([place]) Date [place]: 10/31/20 Time HH Agency Contacted: 1100 Representative spoke with at [place]: [place] (SDOH) Interventions    Readmission Risk Interventions No flowsheet data found.

## 2020-11-16 NOTE — Progress Notes (Signed)
[place] at [place] NAME: Tyniesha Howald    MR#:  [id number]  DATE OF BIRTH:  06-29-1933  SUBJECTIVE:  Feels more short of breath today. Feels as though her nose is congested, making it harder to breathe REVIEW OF SYSTEMS:   Review of Systems  Constitutional: Positive for malaise/fatigue. Negative for chills, fever and weight loss.  HENT: Negative for ear discharge, ear pain and nosebleeds.   Eyes: Negative for blurred vision, pain and discharge.  Respiratory: Positive for cough and shortness of breath. Negative for sputum production, wheezing and stridor.   Cardiovascular: Negative for chest pain, palpitations, orthopnea and PND.  Gastrointestinal: Negative for abdominal pain, diarrhea, nausea and vomiting.  Genitourinary: Negative for frequency and urgency.  Musculoskeletal: Positive for joint pain. Negative for back pain.  Neurological: Positive for weakness. Negative for sensory change, speech change and focal weakness.  Psychiatric/Behavioral: Negative for depression and hallucinations. The patient is not nervous/anxious.    Tolerating Diet:yes Tolerating PT: rehab  DRUG ALLERGIES:   Allergies  Allergen Reactions  . Aspirin Swelling and Other (See Comments)    Pt states that she has tongue swelling.    . Celebrex [Celecoxib] Anaphylaxis  . Meloxicam Swelling  . Nsaids Anaphylaxis, Other (See Comments) and Nausea And Vomiting    unknown   . Quetiapine Anaphylaxis    intolerant  . Rofecoxib Anaphylaxis  . Sulfa Antibiotics Hives, Itching, Anaphylaxis, Nausea And Vomiting and Other (See Comments)  . Tolmetin Anaphylaxis  . Bactrim [Sulfamethoxazole-Trimethoprim] Hives and Itching  . Biguanide Copolymer Other (See Comments)    Reaction:  Unknown   . Buprenorphine Hcl Other (See Comments)    Reaction:  Unknown   . Cetirizine Other (See Comments)  . Ciprofloxacin Other (See Comments)    Reaction:  Unknown   . Codeine Nausea  And Vomiting and Other (See Comments)    Reaction:  Syncope   . Dextromethorphan Hbr Other (See Comments)    Reaction:  GI upset   . Dextromethorphan Polistirex Er Other (See Comments)    Reaction:  GI upset  GI upset  . Furosemide Other (See Comments)    Reaction:  Unknown   . Hydrocodone Other (See Comments)    Reaction:  Unknown   . Metformin Other (See Comments)    Reaction:  Unknown   . Morphine And Related Other (See Comments)    Reaction:  Unknown   . Penicillins Swelling and Other (See Comments)    Pt did not answer the additional questions for this medication because she does not remember.    . Pregabalin Other (See Comments)    Reaction:  Unknown   . Promethazine Other (See Comments)    Reaction:  Unknown   . Propoxyphene Other (See Comments)    Reaction:  Unknown   . Propoxyphene Other (See Comments)  . Quinolones Other (See Comments)    Reaction:  Unknown   . Seroquel [Quetiapine Fumarate] Other (See Comments)    Reaction:  Unknown   . Serotonin Reuptake Inhibitors (Ssris) Other (See Comments)    Reaction:  Unknown   . Tramadol Nausea Only  . Ultracet [Tramadol-Acetaminophen] Other (See Comments)    Reaction:  Unknown     VITALS:  Blood pressure (!) 143/38, pulse 73, temperature 97.9 F (36.6 C), resp. rate 20, height 4\' 11"  (1.499 m), weight 117.9 kg, SpO2 98 %.  PHYSICAL EXAMINATION:   Physical Exam  General exam: Alert, awake, oriented x 3  Respiratory system: Clear to auscultation. Respiratory effort normal. Cardiovascular system:RRR. No murmurs, rubs, gallops. 1-2+ edema bilaterally   LABORATORY PANEL:  CBC Recent Labs  Lab 11/11/20 0440  WBC 10.6*  HGB 9.5*  HCT 30.7*  PLT 248    Chemistries  Recent Labs  Lab 11/11/20 0440  NA 145  K 4.0  CL 102  CO2 32  GLUCOSE 108*  BUN 98*  CREATININE 1.32*  CALCIUM 8.9   Cardiac Enzymes No results for input(s): TROPONINI in the last 168 hours. RADIOLOGY:  No results found. ASSESSMENT AND  PLAN:   87/F morbidly obese, chronically ill with multiple medical problems namely CAD, chronic diastolic CHF, COPD, chronic lymphedema, CKD stage III AAA, type 2 diabetes mellitus, hypertension  presented to the emergency room with worsening dyspnea on exertion over the last 1 week, intermittent chest pain, worsening lower extremity edema and productive cough. she was noted to be hypoxic with evidence of fluid overload, CTA chest was negative for PE, subsequently started on BiPAP and IV Lasix.  Acute on chronic hypoxic respiratory failure secondary to acute on chronic diastolic CHF/COPD/CXR? ARDS Morbid obesity with sleep apnea -- requires BiPAP nightly and intermittently -- CTA on admission consistent with fluid overload. No PE --5/24-- patient and patient's son [name] along with daughter-in-law on the phone had a long conversation with palliative care. Given multiple comorbidities and will all long term poor prognosis patient desires to be comfort care. She said go to rehab. Comfort care orders placed by palliative care. Patient is DNR DNI. -- Will continue comfort care meds. Continue Lasix for pulmonary edema.  AK I on CKD stage IIIa -- baseline creatinine 1.1 -- creatinine has been ranging 1.4 to 1.6  Chest pain elevated troponin with history of CAD -- suspect demand ischemia  History of TIA -- Plavix on hold due to hemoptysis  Type II diabetes with CKD stage IIIa  Gerd   Chronic lymphedema/obesity  5/21--Multiple comorbidities. PT OT input appreciated  improving slowly. She is nearing baseline. She is back on all her oral medication. Patient is recommended rehab. Patient has three that offers. 5/21--Discussed with patient's son [name] regarding discharge planning. Will continue to monitor sugars over the weekend hopefully discharge on Monday if patient accepts the bed. 5/22--sugars going up. Overall stable 5/23-- discussed with pulmonology. Recommend palliative  care consultation. Patient was seen by palliative care plans to meet with patient and son tomorrow. Overall declining condition in the last several months. 5/24--pt  is transitioned  to comfort care. Patient and family request hospice facility 5/25--currently awaiting a bed at residential hospice facility  Family communication :Debarah Crape on 5/22 Consults : pulmonary, cardiology CODE STATUS: DNR DVT Prophylaxis : Level of care: Progressive Cardiac Status is: Inpatient     Dispo: The patient is from: Home              Anticipated d/c is to: Hospice facility when bed opens up   [place]: 25 minutes.  >50% time spent on counselling and coordination of care  Note: This dictation was prepared with Dragon dictation along with smaller phrase technology. Any transcriptional errors that result from this process are unintentional.  Kathie Dike M.D    [facility]   CC: Primary care physician; Adin Hector, MDPatient ID: [name], female   DOB: August 15, 1933, 87 y.o.   MRN: [id number]

## 2020-11-17 DIAGNOSIS — Z515 Encounter for palliative care: Secondary | ICD-10-CM | POA: Diagnosis not present

## 2020-11-17 DIAGNOSIS — J9621 Acute and chronic respiratory failure with hypoxia: Secondary | ICD-10-CM | POA: Diagnosis not present

## 2020-11-17 NOTE — Progress Notes (Signed)
Manufacturing engineer [facility])  Mrs. Biswell is approved for Hospice Home.   We do not have an open bed for her today.  [facility] will update hospital staff and family once our bed status changes.  Thank you, Venia Carbon RN, BSN, [place] [facility] Liaison

## 2020-11-17 NOTE — Progress Notes (Addendum)
Daily Progress Note   Patient Name: Teresa Krueger       Date: 11/17/2020 DOB: 07/12/1933  Age: 87 y.o. MRN#: [id number] Attending Physician: Kathie Dike, MD Primary Care Physician: Adin Hector, MD Admit Date: 11/16/2020  Reason for Consultation/Follow-up: Establishing goals of care  Subjective: In to see patient for symptom management check. Patient is sitting in bedside chair asleep. Respirations are even and unlabored. No distress noted. No family at bedside.    Length of Stay: 21  Current Medications: Scheduled Meds:  . ALPRAZolam  0.5 mg Oral BID  . capsaicin   Topical BID  . chlorhexidine  15 mL Mouth Rinse BID  . Chlorhexidine Gluconate Cloth  6 each Topical Q0600  . furosemide  40 mg Oral BID  . insulin aspart protamine- aspart  10 Units Subcutaneous BID WC  . lidocaine  1 patch Transdermal Q24H  . mouth rinse  15 mL Mouth Rinse q12n4p  . mupirocin ointment   Topical BID  . pantoprazole  40 mg Oral Daily  . spironolactone  50 mg Oral BID  . triamcinolone cream   Topical BID    Continuous Infusions:   PRN Meds: acetaminophen **OR** acetaminophen, antiseptic oral rinse, bisacodyl, glycopyrrolate **OR** glycopyrrolate **OR** glycopyrrolate, haloperidol **OR** haloperidol **OR** haloperidol lactate, levalbuterol, LORazepam **OR** LORazepam **OR** LORazepam, magnesium hydroxide, morphine injection, ondansetron **OR** ondansetron (ZOFRAN) IV, polyvinyl alcohol, simethicone, sodium chloride  Physical Exam Constitutional:      Comments: Eyes closed.   Pulmonary:     Comments: On O2. Even and unlabored respirations.             Vital Signs: BP (!) 132/49 (BP Location: Right Arm)   Pulse 70   Temp 98.2 F (36.8 C)   Resp 16   Ht 4\' 11"  (1.499 m)   Wt 117.5 kg    SpO2 99%   BMI 52.32 kg/m  SpO2: SpO2: 99 % O2 Device: O2 Device: Nasal Cannula O2 Flow Rate: O2 Flow Rate (L/min): 6 L/min  Intake/output summary:   Intake/Output Summary (Last 24 hours) at 11/17/2020 1456 Last data filed at 11/17/2020 1000 Gross per 24 hour  Intake 410 ml  Output 150 ml  Net 260 ml   LBM: Last BM Date: 11/12/20 Baseline Weight: Weight: 117 kg Most recent weight: Weight: 117.5 kg  Flowsheet Rows   Flowsheet Row Most Recent Value  Intake Tab   Referral Department Hospitalist  Unit at Time of Referral Cardiac/Telemetry Unit  Date Notified 11/13/20  Palliative Care Type New Palliative care  Reason for referral Clarify Goals of Care  Date of Admission 11/05/2020  Date first seen by Palliative Care 11/13/20  # of days Palliative referral response time 0 Day(s)  # of days IP prior to Palliative referral 17  Clinical Assessment   Psychosocial & Spiritual Assessment   Palliative Care Outcomes       Patient Active Problem List   Diagnosis Date Noted  . Angina at rest [facility]) 11/15/2020  . CHF (congestive heart failure) ([place]) 11/15/2020  . Acute on chronic respiratory failure with hypoxia ([place]) 11/16/2020  . Pedal edema 05/02/2020  . Lightheadedness 03/20/2018  . Syncope 03/09/2018  . CKD (chronic kidney disease) stage 3, GFR 30-59 ml/min (HCC) 02/14/2017  . Carotid artery stenosis 10/29/2016  . TIA (transient ischemic attack) 10/14/2016  . Chronic respiratory failure with hypoxia ([place]) 05/24/2016  . Aortic stenosis, mild 05/02/2016  . SOB (shortness of breath) on exertion 05/02/2016  . Swelling of limb 04/10/2016  . Varicose veins of leg with pain, bilateral 04/10/2016  . Lymphedema 04/10/2016  . Osteoarthritis 03/19/2016  . Osteoporosis 03/19/2016  . Coronary artery calcification seen on CAT scan 02/08/2016  . Incidental lung nodule 02/08/2016  . Thoracic aortic atherosclerosis ([place]) 02/08/2016  . S/P placement of cardiac pacemaker 03/30/2015   . Morbid obesity ([place]) 03/17/2015  . Hypersomnia with sleep apnea 03/17/2015  . Mobitz type 2 second degree AV block 03/16/2015  . Multiple pulmonary nodules 09/13/2014  . Lung field abnormal 09/13/2014  . Barrett's esophagus 12/21/2013  . History of adenomatous polyp of colon 12/21/2013  . Hx of adenomatous colonic polyps 12/21/2013  . Benign essential hypertension 11/30/2013  . Acromioclavicular joint arthritis 11/23/2013  . Glenohumeral arthritis 11/23/2013  . Chest pain 10/21/2013  . Adult body mass index 50.0-59.9 ([place]) 02/09/2013  . Morbid obesity with BMI of 50.0-59.9, adult ([place]) 02/09/2013  . Primary localized osteoarthrosis, lower leg 02/09/2013  . Bronchiectasis ([place]) 05/19/2012  . Chronic bronchitis 12/31/2011  . Cough 12/31/2011  . GERD (gastroesophageal reflux disease) 12/31/2011  . Diabetes mellitus ([place])   . Hypertension     Palliative Care Assessment & Plan    Recommendations/Plan: Waiting for hospice facility bed. No changes to medications at this time.    Code Status:    Code Status Orders  (From admission, onward)         Start     Ordered   11/14/20 1147  Do not attempt resuscitation (DNR)  Continuous       Question Answer Comment  In the event of cardiac or respiratory ARREST Do not call a "code blue"   In the event of cardiac or respiratory ARREST Do not perform Intubation, CPR, defibrillation or ACLS   In the event of cardiac or respiratory ARREST Use medication by any route, position, wound care, and other measures to relive pain and suffering. May use oxygen, suction and manual treatment of airway obstruction as needed for comfort.   Comments MOST form in chart.      11/14/20 1148        Code Status History    Date Active Date Inactive Code Status Order ID Comments User Context   10/29/2020 0807 11/14/2020 1148 DNR [id number]  Sidney Ace, MD Inpatient   10/28/2020 0002 10/29/2020 0807 Full Code  [id number]  Elwyn Reach, MD  Inpatient   03/09/2018 1637 03/10/2018 1606 Full Code [id number]  Demetrios Loll, MD Inpatient   10/14/2016 2248 10/15/2016 1905 Full Code [id number]  Dustin Flock, MD Inpatient   03/21/2015 1609 03/22/2015 2130 Full Code [id number]  Isaias Cowman, MD Inpatient   03/16/2015 2253 03/21/2015 1609 Full Code [id number]  Hower, Aaron Mose, MD ED   Advance Care Planning Activity       Prognosis:   < 2 weeks   Thank you for allowing the Palliative Medicine Team to assist in the care of this patient.   Time In: 1:30 Time Out: 1:45 Total Time 15 min Prolonged Time Billed  no       Greater than 50%  of this time was spent counseling and coordinating care related to the above assessment and plan.  Asencion Gowda, NP  Please contact Palliative Medicine Team phone at [phone] for questions and concerns.

## 2020-11-17 NOTE — TOC Progression Note (Signed)
Transition of Care [facility]) - Progression Note    Patient Details  Name: Teresa Krueger MRN: [id number] Date of Birth: 16-Jun-1934  Transition of Care [facility]) CM/SW [employer], [place] Phone Number: 11/17/2020, 9:17 AM  Clinical Narrative:     CSW spoke with [facility] Venia Carbon about hospice home bed availability today. She reports she is waiting to hear if there will be a bed today and will let CSW know.    Expected Discharge Plan: [place] Barriers to Discharge: Hospice Bed not available  Expected Discharge Plan and Services Expected Discharge Plan: [place] Choice: Hospice Living arrangements for the past 2 months: [place] Arranged: PT,OT,RN,Nurse's Aide [place]: [place] ([place]) Date [place]: 10/31/20 Time HH Agency Contacted: 1100 Representative spoke with at [place]: [place] (SDOH) Interventions    Readmission Risk Interventions No flowsheet data found.

## 2020-11-17 NOTE — Progress Notes (Signed)
[place] at [place] NAME: Teresa Krueger    MR#:  [id number]  DATE OF BIRTH:  December 02, 1933  SUBJECTIVE:  Occasionally has some epistaxis, currently resolved, still short of breath but feels that morphine helps with breathing REVIEW OF SYSTEMS:   Review of Systems  Constitutional: Positive for malaise/fatigue. Negative for chills, fever and weight loss.  HENT: Negative for ear discharge, ear pain and nosebleeds.   Eyes: Negative for blurred vision, pain and discharge.  Respiratory: Positive for cough and shortness of breath. Negative for sputum production, wheezing and stridor.   Cardiovascular: Negative for chest pain, palpitations, orthopnea and PND.  Gastrointestinal: Negative for abdominal pain, diarrhea, nausea and vomiting.  Genitourinary: Negative for frequency and urgency.  Musculoskeletal: Positive for joint pain. Negative for back pain.  Neurological: Positive for weakness. Negative for sensory change, speech change and focal weakness.  Psychiatric/Behavioral: Negative for depression and hallucinations. The patient is not nervous/anxious.     DRUG ALLERGIES:   Allergies  Allergen Reactions  . Aspirin Swelling and Other (See Comments)    Pt states that she has tongue swelling.    . Celebrex [Celecoxib] Anaphylaxis  . Meloxicam Swelling  . Nsaids Anaphylaxis, Other (See Comments) and Nausea And Vomiting    unknown   . Quetiapine Anaphylaxis    intolerant  . Rofecoxib Anaphylaxis  . Sulfa Antibiotics Hives, Itching, Anaphylaxis, Nausea And Vomiting and Other (See Comments)  . Tolmetin Anaphylaxis  . Bactrim [Sulfamethoxazole-Trimethoprim] Hives and Itching  . Biguanide Copolymer Other (See Comments)    Reaction:  Unknown   . Buprenorphine Hcl Other (See Comments)    Reaction:  Unknown   . Cetirizine Other (See Comments)  . Ciprofloxacin Other (See Comments)    Reaction:  Unknown   . Codeine Nausea And Vomiting and Other  (See Comments)    Reaction:  Syncope   . Dextromethorphan Hbr Other (See Comments)    Reaction:  GI upset   . Dextromethorphan Polistirex Er Other (See Comments)    Reaction:  GI upset  GI upset  . Furosemide Other (See Comments)    Reaction:  Unknown   . Hydrocodone Other (See Comments)    Reaction:  Unknown   . Metformin Other (See Comments)    Reaction:  Unknown   . Morphine And Related Other (See Comments)    Reaction:  Unknown   . Penicillins Swelling and Other (See Comments)    Pt did not answer the additional questions for this medication because she does not remember.    . Pregabalin Other (See Comments)    Reaction:  Unknown   . Promethazine Other (See Comments)    Reaction:  Unknown   . Propoxyphene Other (See Comments)    Reaction:  Unknown   . Propoxyphene Other (See Comments)  . Quinolones Other (See Comments)    Reaction:  Unknown   . Seroquel [Quetiapine Fumarate] Other (See Comments)    Reaction:  Unknown   . Serotonin Reuptake Inhibitors (Ssris) Other (See Comments)    Reaction:  Unknown   . Tramadol Nausea Only  . Ultracet [Tramadol-Acetaminophen] Other (See Comments)    Reaction:  Unknown     VITALS:  Blood pressure (!) 136/44, pulse 74, temperature 98.6 F (37 C), resp. rate 16, height 4\' 11"  (1.499 m), weight 117.5 kg, SpO2 98 %.  PHYSICAL EXAMINATION:   Physical Exam  General exam: Alert, awake, oriented x 3 Respiratory system: diminished at bases increased  resp effort Cardiovascular system:tachycardic. No murmurs, rubs, gallops.     LABORATORY PANEL:  CBC Recent Labs  Lab 11/11/20 0440  WBC 10.6*  HGB 9.5*  HCT 30.7*  PLT 248    Chemistries  Recent Labs  Lab 11/11/20 0440  NA 145  K 4.0  CL 102  CO2 32  GLUCOSE 108*  BUN 98*  CREATININE 1.32*  CALCIUM 8.9   Cardiac Enzymes No results for input(s): TROPONINI in the last 168 hours. RADIOLOGY:  No results found. ASSESSMENT AND PLAN:   87/F morbidly obese, chronically  ill with multiple medical problems namely CAD, chronic diastolic CHF, COPD, chronic lymphedema, CKD stage III AAA, type 2 diabetes mellitus, hypertension  presented to the emergency room with worsening dyspnea on exertion over the last 1 week, intermittent chest pain, worsening lower extremity edema and productive cough. she was noted to be hypoxic with evidence of fluid overload, CTA chest was negative for PE, subsequently started on BiPAP and IV Lasix.  Acute on chronic hypoxic respiratory failure secondary to acute on chronic diastolic CHF/COPD/CXR? ARDS Morbid obesity with sleep apnea -- requires BiPAP nightly and intermittently -- CTA on admission consistent with fluid overload. No PE --5/24-- patient and patient's son Teresa Krueger along with daughter-in-law on the phone had a long conversation with palliative care. Given multiple comorbidities and will all long term poor prognosis patient desires to be comfort care. She said go to rehab. Comfort care orders placed by palliative care. Patient is DNR DNI. -- Will continue comfort care meds. Continue Lasix for pulmonary edema.  AK I on CKD stage IIIa -- baseline creatinine 1.1 -- creatinine has been ranging 1.4 to 1.6  Chest pain elevated troponin with history of CAD -- suspect demand ischemia  History of TIA -- Plavix on hold due to hemoptysis  Type II diabetes with CKD stage IIIa  Gerd   Chronic lymphedema/obesity  5/21--Multiple comorbidities. PT OT input appreciated  improving slowly. She is nearing baseline. She is back on all her oral medication. Patient is recommended rehab. Patient has three that offers. 5/21--Discussed with patient's son Teresa Krueger regarding discharge planning. Will continue to monitor sugars over the weekend hopefully discharge on Monday if patient accepts the bed. 5/22--sugars going up. Overall stable 5/23-- discussed with pulmonology. Recommend palliative care consultation. Patient was seen by  palliative care plans to meet with patient and son tomorrow. Overall declining condition in the last several months. 5/24--pt  is transitioned  to comfort care. Patient and family request hospice facility 5/25--currently awaiting a bed at residential hospice facility  Family communication :discussed with daughter in law at the bedside Consults : pulmonary, cardiology CODE STATUS: DNR DVT Prophylaxis : Level of care: Progressive Cardiac Status is: Inpatient     Dispo: The patient is from: Home              Anticipated d/c is to: Hospice facility when bed opens up   [place]: 25 minutes.  >50% time spent on counselling and coordination of care  Note: This dictation was prepared with Dragon dictation along with smaller phrase technology. Any transcriptional errors that result from this process are unintentional.  Kathie Dike M.D    [facility]   CC: Primary care physician; Adin Hector, MDPatient ID: Teresa Krueger, female   DOB: Sep 28, 1933, 87 y.o.   MRN: [id number]

## 2020-11-18 DIAGNOSIS — Z515 Encounter for palliative care: Secondary | ICD-10-CM | POA: Diagnosis not present

## 2020-11-18 DIAGNOSIS — J9621 Acute and chronic respiratory failure with hypoxia: Secondary | ICD-10-CM | POA: Diagnosis not present

## 2020-11-18 MED ORDER — MORPHINE SULFATE (PF) 2 MG/ML IV SOLN
2.0000 mg | INTRAVENOUS | Status: DC | PRN
  Administered 2020-11-18 – 2020-11-20 (×4): 2 mg via INTRAVENOUS
  Filled 2020-11-18 (×4): qty 1

## 2020-11-18 NOTE — Plan of Care (Signed)
  Problem: Education: Goal: Knowledge of General Education information will improve Description: Including pain rating scale, medication(s)/side effects and non-pharmacologic comfort measures Outcome: Progressing   Problem: Activity: Goal: Risk for activity intolerance will decrease Outcome: Progressing   Problem: Elimination: Goal: Will not experience complications related to urinary retention Outcome: Progressing   Problem: Pain Managment: Goal: General experience of comfort will improve Outcome: Progressing   Problem: Safety: Goal: Ability to remain free from injury will improve Outcome: Progressing   Problem: Skin Integrity: Goal: Risk for impaired skin integrity will decrease Outcome: Progressing   

## 2020-11-18 NOTE — TOC Progression Note (Signed)
Transition of Care [facility]) - Progression Note    Patient Details  Name: Teresa Krueger MRN: [id number] Date of Birth: Dec 23, 1933  Transition of [facility]) CM/SW Contact  Izola Price, RN Phone Number: 11/18/2020, 2:45 PM  Clinical Narrative: 11/18/20 Per CM note of 5/27 [facility] inpatient bed pending and they will contact TOC when bed available. Avoidable days documented by Olga Coaster, weekend supervisor, per request by Dr. Wynelle Cleveland. Dominica Severin RN CM [phone]   Expected Discharge Plan: [place] Barriers to Discharge: Hospice Bed not available  Expected Discharge Plan and Services Expected Discharge Plan: [place] Choice: Hospice Living arrangements for the past 2 months: [place] Arranged: PT,OT,RN,Nurse's Aide [place] Agency: [place] ([place]) Date [place]: 10/31/20 Time [place]: 1100 Representative spoke with at [place]: [place] (SDOH) Interventions    Readmission Risk Interventions No flowsheet data found.

## 2020-11-18 NOTE — Progress Notes (Signed)
Teresa Krueger NAME: Teresa Krueger    MR#:  [id number]  DATE OF BIRTH:  04-Apr-1934  SUBJECTIVE:  Still feels short of breath. Has difficulty expectorating sputum. Feels anxious REVIEW OF SYSTEMS:   Review of Systems  Constitutional: Positive for malaise/fatigue. Negative for chills, fever and weight loss.  HENT: Negative for ear discharge, ear pain and nosebleeds.   Eyes: Negative for blurred vision, pain and discharge.  Respiratory: Positive for cough and shortness of breath. Negative for sputum production, wheezing and stridor.   Cardiovascular: Negative for chest pain, palpitations, orthopnea and PND.  Gastrointestinal: Negative for abdominal pain, diarrhea, nausea and vomiting.  Genitourinary: Negative for frequency and urgency.  Musculoskeletal: Positive for joint pain. Negative for back pain.  Neurological: Positive for weakness. Negative for sensory change, speech change and focal weakness.  Psychiatric/Behavioral: Negative for depression and hallucinations. The patient is not nervous/anxious.     DRUG ALLERGIES:   Allergies  Allergen Reactions  . Aspirin Swelling and Other (See Comments)    Pt states that she has tongue swelling.    . Celebrex [Celecoxib] Anaphylaxis  . Meloxicam Swelling  . Nsaids Anaphylaxis, Other (See Comments) and Nausea And Vomiting    unknown   . Quetiapine Anaphylaxis    intolerant  . Rofecoxib Anaphylaxis  . Sulfa Antibiotics Hives, Itching, Anaphylaxis, Nausea And Vomiting and Other (See Comments)  . Tolmetin Anaphylaxis  . Bactrim [Sulfamethoxazole-Trimethoprim] Hives and Itching  . Biguanide Copolymer Other (See Comments)    Reaction:  Unknown   . Buprenorphine Hcl Other (See Comments)    Reaction:  Unknown   . Cetirizine Other (See Comments)  . Ciprofloxacin Other (See Comments)    Reaction:  Unknown   . Codeine Nausea And Vomiting and Other (See Comments)    Reaction:  Syncope   .  Dextromethorphan Hbr Other (See Comments)    Reaction:  GI upset   . Dextromethorphan Polistirex Er Other (See Comments)    Reaction:  GI upset  GI upset  . Furosemide Other (See Comments)    Reaction:  Unknown   . Hydrocodone Other (See Comments)    Reaction:  Unknown   . Metformin Other (See Comments)    Reaction:  Unknown   . Morphine And Related Other (See Comments)    Reaction:  Unknown   . Penicillins Swelling and Other (See Comments)    Pt did not answer the additional questions for this medication because she does not remember.    . Pregabalin Other (See Comments)    Reaction:  Unknown   . Promethazine Other (See Comments)    Reaction:  Unknown   . Propoxyphene Other (See Comments)    Reaction:  Unknown   . Propoxyphene Other (See Comments)  . Quinolones Other (See Comments)    Reaction:  Unknown   . Seroquel [Quetiapine Fumarate] Other (See Comments)    Reaction:  Unknown   . Serotonin Reuptake Inhibitors (Ssris) Other (See Comments)    Reaction:  Unknown   . Tramadol Nausea Only  . Ultracet [Tramadol-Acetaminophen] Other (See Comments)    Reaction:  Unknown     VITALS:  Blood pressure (!) 142/43, pulse 82, temperature 97.7 F (36.5 C), resp. rate (!) 24, height 4\' 11"  (1.499 m), weight 117.5 kg, SpO2 95 %.  PHYSICAL EXAMINATION:   Physical Exam  General exam: Alert, awake, oriented x 3 Respiratory system: diminished at bases increased resp effort Cardiovascular system:tachycardic. No  murmurs, rubs, gallops.     LABORATORY PANEL:  CBC No results for input(s): WBC, HGB, HCT, PLT in the last 168 hours.  Chemistries  No results for input(s): NA, K, CL, CO2, GLUCOSE, BUN, CREATININE, CALCIUM, MG, AST, ALT, ALKPHOS, BILITOT in the last 168 hours.  Invalid input(s): GFRCGP Cardiac Enzymes No results for input(s): TROPONINI in the last 168 hours. RADIOLOGY:  No results found. ASSESSMENT AND PLAN:   87/F morbidly obese, chronically ill with multiple  medical problems namely CAD, chronic diastolic CHF, COPD, chronic lymphedema, CKD stage III AAA, type 2 diabetes mellitus, hypertension  presented to the emergency room with worsening dyspnea on exertion over the last 1 week, intermittent chest pain, worsening lower extremity edema and productive cough. she was noted to be hypoxic with evidence of fluid overload, CTA chest was negative for PE, subsequently started on BiPAP and IV Lasix.  Acute on chronic hypoxic respiratory failure secondary to acute on chronic diastolic CHF/COPD/CXR? ARDS Morbid obesity with sleep apnea -- requires BiPAP nightly and intermittently -- CTA on admission consistent with fluid overload. No PE --5/24-- patient and patient's son Teresa Krueger along with daughter-in-law on the phone had a long conversation with palliative care. Given multiple comorbidities and will all long term poor prognosis patient desires to be comfort care. She said go to rehab. Comfort care orders placed by palliative care. Patient is DNR DNI. -- Will continue comfort care meds. Continue Lasix for pulmonary edema.  AK I on CKD stage IIIa -- baseline creatinine 1.1 -- creatinine has been ranging 1.4 to 1.6  Chest pain elevated troponin with history of CAD -- suspect demand ischemia  History of TIA -- Plavix on hold due to hemoptysis  Type II diabetes with CKD stage IIIa  Gerd   Chronic lymphedema/obesity  5/21--Multiple comorbidities. PT OT input appreciated  improving slowly. She is nearing baseline. She is back on all her oral medication. Patient is recommended rehab. Patient has three that offers. 5/21--Discussed with patient's son Teresa Krueger regarding discharge planning. Will continue to monitor sugars over the weekend hopefully discharge on Monday if patient accepts the bed. 5/22--sugars going up. Overall stable 5/23-- discussed with pulmonology. Recommend palliative care consultation. Patient was seen by palliative care plans to  meet with patient and son tomorrow. Overall declining condition in the last several months. 5/24--pt  is transitioned  to comfort care. Patient and family request hospice facility 5/25--currently awaiting a bed at residential hospice facility 5/28--adjusting morphine for shortness of breath. Patient wishes to continue lasix and insulin for now. Continue to wait on residential hospice  Family communication :discussed with sister at the bedside Consults : pulmonary, cardiology, palliative care CODE STATUS: DNR DVT Prophylaxis : Level of care: Progressive Cardiac Status is: Inpatient     Dispo: The patient is from: Home              Anticipated d/c is to: Hospice facility when bed opens up   [place]: 25 minutes.  >50% time spent on counselling and coordination of care  Note: This dictation was prepared with Dragon dictation along with smaller phrase technology. Any transcriptional errors that result from this process are unintentional.  Kathie Dike M.D    [facility]   CC: Primary care physician; Adin Hector, MDPatient ID: Teresa Krueger, female   DOB: [birth year], 87 y.o.   MRN: [id number]

## 2020-11-19 DIAGNOSIS — J9621 Acute and chronic respiratory failure with hypoxia: Secondary | ICD-10-CM | POA: Diagnosis not present

## 2020-11-19 DIAGNOSIS — Z515 Encounter for palliative care: Secondary | ICD-10-CM | POA: Diagnosis not present

## 2020-11-19 NOTE — Progress Notes (Signed)
[facility] NAME: Teresa Krueger    MR#:  [id number]  DATE OF BIRTH:  1933-07-13  SUBJECTIVE:  Feels that breathing is about the same. No new complaints REVIEW OF SYSTEMS:   Review of Systems  Constitutional: Positive for malaise/fatigue. Negative for chills, fever and weight loss.  HENT: Negative for ear discharge, ear pain and nosebleeds.   Eyes: Negative for blurred vision, pain and discharge.  Respiratory: Positive for cough and shortness of breath. Negative for sputum production, wheezing and stridor.   Cardiovascular: Negative for chest pain, palpitations, orthopnea and PND.  Gastrointestinal: Negative for abdominal pain, diarrhea, nausea and vomiting.  Genitourinary: Negative for frequency and urgency.  Musculoskeletal: Positive for joint pain. Negative for back pain.  Neurological: Positive for weakness. Negative for sensory change, speech change and focal weakness.  Psychiatric/Behavioral: Negative for depression and hallucinations. The patient is nervous/anxious.     DRUG ALLERGIES:   Allergies  Allergen Reactions  . Aspirin Swelling and Other (See Comments)    Pt states that she has tongue swelling.    . Celebrex [Celecoxib] Anaphylaxis  . Meloxicam Swelling  . Nsaids Anaphylaxis, Other (See Comments) and Nausea And Vomiting    unknown   . Quetiapine Anaphylaxis    intolerant  . Rofecoxib Anaphylaxis  . Sulfa Antibiotics Hives, Itching, Anaphylaxis, Nausea And Vomiting and Other (See Comments)  . Tolmetin Anaphylaxis  . Bactrim [Sulfamethoxazole-Trimethoprim] Hives and Itching  . Biguanide Copolymer Other (See Comments)    Reaction:  Unknown   . Buprenorphine Hcl Other (See Comments)    Reaction:  Unknown   . Cetirizine Other (See Comments)  . Ciprofloxacin Other (See Comments)    Reaction:  Unknown   . Codeine Nausea And Vomiting and Other (See Comments)    Reaction:  Syncope   . Dextromethorphan Hbr  Other (See Comments)    Reaction:  GI upset   . Dextromethorphan Polistirex Er Other (See Comments)    Reaction:  GI upset  GI upset  . Furosemide Other (See Comments)    Reaction:  Unknown   . Hydrocodone Other (See Comments)    Reaction:  Unknown   . Metformin Other (See Comments)    Reaction:  Unknown   . Morphine And Related Other (See Comments)    Reaction:  Unknown   . Penicillins Swelling and Other (See Comments)    Pt did not answer the additional questions for this medication because she does not remember.    . Pregabalin Other (See Comments)    Reaction:  Unknown   . Promethazine Other (See Comments)    Reaction:  Unknown   . Propoxyphene Other (See Comments)    Reaction:  Unknown   . Propoxyphene Other (See Comments)  . Quinolones Other (See Comments)    Reaction:  Unknown   . Seroquel [Quetiapine Fumarate] Other (See Comments)    Reaction:  Unknown   . Serotonin Reuptake Inhibitors (Ssris) Other (See Comments)    Reaction:  Unknown   . Tramadol Nausea Only  . Ultracet [Tramadol-Acetaminophen] Other (See Comments)    Reaction:  Unknown     VITALS:  Blood pressure (!) 120/45, pulse 86, temperature 98 F (36.7 C), resp. rate 20, height 4\' 11"  (1.499 m), weight 118.9 kg, SpO2 97 %.  PHYSICAL EXAMINATION:   Physical Exam  General exam: Alert, awake, oriented x 3 Respiratory system: crackles and diminished breath sounds at bases. Respiratory effort normal. Cardiovascular system:RRR.  No murmurs, rubs, gallops. Gastrointestinal system: Abdomen is nondistended, soft and nontender. No organomegaly or masses felt. Normal bowel sounds heard. Central nervous system: Alert and oriented. No focal neurological deficits. Extremities: 2+ edema bilaterally Skin: No rashes, lesions or ulcers Psychiatry: Judgement and insight appear normal. Mood & affect appropriate.       LABORATORY PANEL:  CBC No results for input(s): WBC, HGB, HCT, PLT in the last 168  hours.  Chemistries  No results for input(s): NA, K, CL, CO2, GLUCOSE, BUN, CREATININE, CALCIUM, MG, AST, ALT, ALKPHOS, BILITOT in the last 168 hours.  Invalid input(s): GFRCGP Cardiac Enzymes No results for input(s): TROPONINI in the last 168 hours. RADIOLOGY:  No results found. ASSESSMENT AND PLAN:   87/F morbidly obese, chronically ill with multiple medical problems namely CAD, chronic diastolic CHF, COPD, chronic lymphedema, CKD stage III AAA, type 2 diabetes mellitus, hypertension  presented to the emergency room with worsening dyspnea on exertion over the last 1 week, intermittent chest pain, worsening lower extremity edema and productive cough. she was noted to be hypoxic with evidence of fluid overload, CTA chest was negative for PE, subsequently started on BiPAP and IV Lasix.  Acute on chronic hypoxic respiratory failure secondary to acute on chronic diastolic CHF/COPD/CXR? ARDS Morbid obesity with sleep apnea -- requires BiPAP nightly and intermittently -- CTA on admission consistent with fluid overload. No PE --5/24-- patient and patient's son [name] along with daughter-in-law on the phone had a long conversation with palliative care. Given multiple comorbidities and will all long term poor prognosis patient desires to be comfort care. She said go to rehab. Comfort care orders placed by palliative care. Patient is DNR DNI. -- Will continue comfort care meds.  --discontinue lasix and aldactone  AK I on CKD stage IIIa -- baseline creatinine 1.1 -- creatinine has been ranging 1.4 to 1.6  Chest pain elevated troponin with history of CAD -- suspect demand ischemia  History of TIA -- Plavix on hold due to hemoptysis  Type II diabetes with CKD stage IIIa -discontinue insulin and cbg checks at patient's request  Gerd   Chronic lymphedema/obesity  5/21--Multiple comorbidities. PT OT input appreciated  improving slowly. She is nearing baseline. She is back on all her  oral medication. Patient is recommended rehab. Patient has three that offers. 5/21--Discussed with patient's son [name] regarding discharge planning. Will continue to monitor sugars over the weekend hopefully discharge on Monday if patient accepts the bed. 5/22--sugars going up. Overall stable 5/23-- discussed with pulmonology. Recommend palliative care consultation. Patient was seen by palliative care plans to meet with patient and son tomorrow. Overall declining condition in the last several months. 5/24--pt  is transitioned  to comfort care. Patient and family request hospice facility 5/25--currently awaiting a bed at residential hospice facility 5/28--adjusting morphine for shortness of breath. Patient wishes to continue lasix and insulin for now. Continue to wait on residential hospice  Family communication :discussed with son at the bedside Consults : pulmonary, cardiology, palliative care CODE STATUS: DNR DVT Prophylaxis : Level of care: Progressive Cardiac Status is: Inpatient     Dispo: The patient is from: Home              Anticipated d/c is to: Hospice facility when bed opens up   [place]: 25 minutes.  >50% time spent on counselling and coordination of care  Note: This dictation was prepared with Dragon dictation along with smaller phrase technology. Any  transcriptional errors that result from this process are unintentional.  Kathie Dike M.D    [facility]   CC: Primary care physician; Teresa Krueger, MDPatient ID: Teresa Krueger, female   DOB: [birth year], 87 y.o.   MRN: [id number]

## 2020-11-20 DIAGNOSIS — Z515 Encounter for palliative care: Secondary | ICD-10-CM | POA: Diagnosis not present

## 2020-11-20 DIAGNOSIS — J9621 Acute and chronic respiratory failure with hypoxia: Secondary | ICD-10-CM | POA: Diagnosis not present

## 2020-11-20 MED ORDER — MORPHINE SULFATE (PF) 2 MG/ML IV SOLN: 2.0000 mg | Freq: Once | INTRAVENOUS | Status: DC

## 2020-11-20 MED ORDER — MORPHINE BOLUS VIA INFUSION
2.0000 mg | INTRAVENOUS | Status: DC | PRN
Start: 2020-11-20 — End: 2020-11-21
  Filled 2020-11-20: qty 2

## 2020-11-20 MED ORDER — MORPHINE 100MG IN NS 100ML (1MG/ML) PREMIX INFUSION
1.0000 mg/h | INTRAVENOUS | Status: DC
  Administered 2020-11-20: 1 mg/h via INTRAVENOUS
  Filled 2020-11-20: qty 100

## 2020-11-20 NOTE — Care Management Important Message (Signed)
Important Message  Patient Details  Name: Teresa Krueger MRN: [id number] Date of Birth: 11-02-1933   Medicare Important Message Given:  Other (see comment)  Patient is on comfort care and awaiting a bed at the [facility]. Out of respect for the patient and family no Important Message from [facility] given.  [name] 11/20/2020, 10:13 AM

## 2020-11-20 NOTE — TOC Progression Note (Signed)
Transition of Care [facility]) - Progression Note    Patient Details  Name: Teresa Krueger MRN: [id number] Date of Birth: February 25, 1934  Transition of Care [facility]) CM/SW [place], [place] Phone Number: 11/20/2020, 9:44 AM  Clinical Narrative:     CSW has reached out to Jhonnie Garner with [facility] at [phone], reports not aware of bed availability this morning for hospice home but will let CSW know.   Expected Discharge Plan: [place] Barriers to Discharge: Hospice Bed not available  Expected Discharge Plan and Services Expected Discharge Plan: [place] Choice: Hospice Living arrangements for the past 2 months: [place] Arranged: PT,OT,RN,Nurse's Aide [place]: [place] ([place]) Date [place]: 10/31/20 Time HH Agency Contacted: 1100 Representative spoke with at [place]: [place] (SDOH) Interventions    Readmission Risk Interventions No flowsheet data found.

## 2020-11-20 NOTE — Progress Notes (Addendum)
Daily Progress Note   Patient Name: Teresa Krueger       Date: 11/20/2020 DOB: [birth year]  Age: 87 y.o. MRN#: [id number] Attending Physician: Kathie Dike, MD Primary Care Physician: Adin Hector, MD Admit Date: 11/05/2020  Reason for Consultation/Follow-up: Terminal Care  Subjective: Patient is resting in bed with eyes closed. Work of breathing noted as patient is using accessory muscles to breathe. She moans while sleeping. O2 is around 8 lpm by [place]. Patient awakens to my voice. She complains of feeling SOB and speaks in broken sentences. Discussed changing medications for her comfort. Per nursing patient has been having work of breathing and Morphine was given this morning with some improvement noted. Discussed changes in plan with primary RN.   Length of Stay: 24  Current Medications: Scheduled Meds:  . ALPRAZolam  0.5 mg Oral BID  . capsaicin   Topical BID  . chlorhexidine  15 mL Mouth Rinse BID  . lidocaine  1 patch Transdermal Q24H  . mouth rinse  15 mL Mouth Rinse q12n4p  . mupirocin ointment   Topical BID  . pantoprazole  40 mg Oral Daily  . triamcinolone cream   Topical BID    Continuous Infusions:   PRN Meds: acetaminophen **OR** acetaminophen, antiseptic oral rinse, bisacodyl, glycopyrrolate **OR** glycopyrrolate **OR** glycopyrrolate, haloperidol **OR** haloperidol **OR** haloperidol lactate, levalbuterol, LORazepam **OR** LORazepam **OR** LORazepam, magnesium hydroxide, morphine injection, ondansetron **OR** ondansetron (ZOFRAN) IV, polyvinyl alcohol, simethicone, sodium chloride  Physical Exam Pulmonary:     Effort: Accessory muscle usage present.  Neurological:     Mental Status: She is alert.             Vital Signs: BP (!) 129/52 (BP Location: Right  Arm)   Pulse 89   Temp 98.1 F (36.7 C)   Resp (!) 21   Ht 4\' 11"  (1.499 m)   Wt 118.9 kg   SpO2 96%   BMI 52.94 kg/m  SpO2: SpO2: 96 % O2 Device: O2 Device: Nasal Cannula O2 Flow Rate: O2 Flow Rate (L/min): 6 L/min  Intake/output summary:   Intake/Output Summary (Last 24 hours) at 11/20/2020 0944 Last data filed at 11/20/2020 4580 Gross per 24 hour  Intake 240 ml  Output 600 ml  Net -360 ml   LBM: Last BM Date: 11/12/20 Baseline Weight:  Weight: 117 kg Most recent weight: Weight: 118.9 kg     Flowsheet Rows   Flowsheet Row Most Recent Value  Intake Tab   Referral Department Hospitalist  Unit at Time of Referral Cardiac/Telemetry Unit  Date Notified 11/13/20  Palliative Care Type New Palliative care  Reason for referral Clarify Goals of Care  Date of Admission 11/17/2020  Date first seen by Palliative Care 11/13/20  # of days Palliative referral response time 0 Day(s)  # of days IP prior to Palliative referral 17  Clinical Assessment   Psychosocial & Spiritual Assessment   Palliative Care Outcomes       Patient Active Problem List   Diagnosis Date Noted  . Angina at rest [facility]) 11/02/2020  . CHF (congestive heart failure) ([place]) 11/12/2020  . Acute on chronic respiratory failure with hypoxia ([place]) 11/02/2020  . Pedal edema 05/02/2020  . Lightheadedness 03/20/2018  . Syncope 03/09/2018  . CKD (chronic kidney disease) stage 3, GFR 30-59 ml/min (HCC) 02/14/2017  . Carotid artery stenosis 10/29/2016  . TIA (transient ischemic attack) 10/14/2016  . Chronic respiratory failure with hypoxia ([place]) 05/24/2016  . Aortic stenosis, mild 05/02/2016  . SOB (shortness of breath) on exertion 05/02/2016  . Swelling of limb 04/10/2016  . Varicose veins of leg with pain, bilateral 04/10/2016  . Lymphedema 04/10/2016  . Osteoarthritis 03/19/2016  . Osteoporosis 03/19/2016  . Coronary artery calcification seen on CAT scan 02/08/2016  . Incidental lung nodule 02/08/2016  .  Thoracic aortic atherosclerosis ([place]) 02/08/2016  . S/P placement of cardiac pacemaker 03/30/2015  . Morbid obesity ([place]) 03/17/2015  . Hypersomnia with sleep apnea 03/17/2015  . Mobitz type 2 second degree AV block 03/16/2015  . Multiple pulmonary nodules 09/13/2014  . Lung field abnormal 09/13/2014  . Barrett's esophagus 12/21/2013  . History of adenomatous polyp of colon 12/21/2013  . Hx of adenomatous colonic polyps 12/21/2013  . Benign essential hypertension 11/30/2013  . Acromioclavicular joint arthritis 11/23/2013  . Glenohumeral arthritis 11/23/2013  . Chest pain 10/21/2013  . Adult body mass index 50.0-59.9 ([place]) 02/09/2013  . Morbid obesity with BMI of 50.0-59.9, adult ([place]) 02/09/2013  . Primary localized osteoarthrosis, lower leg 02/09/2013  . Bronchiectasis ([place]) 05/19/2012  . Chronic bronchitis 12/31/2011  . Cough 12/31/2011  . GERD (gastroesophageal reflux disease) 12/31/2011  . Diabetes mellitus ([place])   . Hypertension     Palliative Care Assessment & Plan   Recommendations/Plan:  Morphine infusion initiated for SOB and WOB.   Please use other available medications as needed for symptom management and comfort.     Code Status:    Code Status Orders  (From admission, onward)         Start     Ordered   11/14/20 1147  Do not attempt resuscitation (DNR)  Continuous       Question Answer Comment  In the event of cardiac or respiratory ARREST Do not call a "code blue"   In the event of cardiac or respiratory ARREST Do not perform Intubation, CPR, defibrillation or ACLS   In the event of cardiac or respiratory ARREST Use medication by any route, position, wound care, and other measures to relive pain and suffering. May use oxygen, suction and manual treatment of airway obstruction as needed for comfort.   Comments MOST form in chart.      11/14/20 1148        Code Status History    Date Active Date Inactive Code Status Order ID Comments User  Context    10/29/2020 0807 11/14/2020 1148 DNR [id number]  Sidney Ace, MD Inpatient   10/28/2020 0002 10/29/2020 0807 Full Code [id number]  Elwyn Reach, MD Inpatient   03/09/2018 1637 03/10/2018 1606 Full Code [id number]  Demetrios Loll, MD Inpatient   10/14/2016 2248 10/15/2016 1905 Full Code [id number]  Dustin Flock, MD Inpatient   03/21/2015 1609 03/22/2015 2130 Full Code [id number]  Isaias Cowman, MD Inpatient   03/16/2015 2253 03/21/2015 1609 Full Code [id number]  Hower, Aaron Mose, MD ED   Advance Care Planning Activity       Prognosis:   < 2 weeks    Care plan was discussed with nursing  Thank you for allowing the Palliative Medicine Team to assist in the care of this patient.   Total Time 25 min Prolonged Time Billed  no      Greater than 50%  of this time was spent counseling and coordinating care related to the above assessment and plan.  Asencion Gowda, NP  Please contact Palliative Medicine Team phone at [phone] for questions and concerns.

## 2020-11-20 NOTE — Progress Notes (Signed)
[place] [facility] [place] NAME: Teresa Krueger    MR#:  [id number]  DATE OF BIRTH:  [birth year]  SUBJECTIVE:  Still feels short of breath.  Says she is having difficulty expectorating sputum. REVIEW OF SYSTEMS:   Review of Systems  Constitutional: Positive for malaise/fatigue. Negative for chills, fever and weight loss.  HENT: Negative for ear discharge, ear pain and nosebleeds.   Eyes: Negative for blurred vision, pain and discharge.  Respiratory: Positive for cough and shortness of breath. Negative for sputum production, wheezing and stridor.   Cardiovascular: Negative for chest pain, palpitations, orthopnea and PND.  Gastrointestinal: Negative for abdominal pain, diarrhea, nausea and vomiting.  Genitourinary: Negative for frequency and urgency.  Musculoskeletal: Positive for joint pain. Negative for back pain.  Neurological: Positive for weakness. Negative for sensory change, speech change and focal weakness.  Psychiatric/Behavioral: Negative for depression and hallucinations. The patient is nervous/anxious.     DRUG ALLERGIES:   Allergies  Allergen Reactions  . Aspirin Swelling and Other (See Comments)    Pt states that she has tongue swelling.    . Celebrex [Celecoxib] Anaphylaxis  . Meloxicam Swelling  . Nsaids Anaphylaxis, Other (See Comments) and Nausea And Vomiting    unknown   . Quetiapine Anaphylaxis    intolerant  . Rofecoxib Anaphylaxis  . Sulfa Antibiotics Hives, Itching, Anaphylaxis, Nausea And Vomiting and Other (See Comments)  . Tolmetin Anaphylaxis  . Bactrim [Sulfamethoxazole-Trimethoprim] Hives and Itching  . Biguanide Copolymer Other (See Comments)    Reaction:  Unknown   . Buprenorphine Hcl Other (See Comments)    Reaction:  Unknown   . Cetirizine Other (See Comments)  . Ciprofloxacin Other (See Comments)    Reaction:  Unknown   . Codeine Nausea And Vomiting and Other (See Comments)    Reaction:  Syncope   .  Dextromethorphan Hbr Other (See Comments)    Reaction:  GI upset   . Dextromethorphan Polistirex Er Other (See Comments)    Reaction:  GI upset  GI upset  . Furosemide Other (See Comments)    Reaction:  Unknown   . Hydrocodone Other (See Comments)    Reaction:  Unknown   . Metformin Other (See Comments)    Reaction:  Unknown   . Morphine And Related Other (See Comments)    Reaction:  Unknown   . Penicillins Swelling and Other (See Comments)    Pt did not answer the additional questions for this medication because she does not remember.    . Pregabalin Other (See Comments)    Reaction:  Unknown   . Promethazine Other (See Comments)    Reaction:  Unknown   . Propoxyphene Other (See Comments)    Reaction:  Unknown   . Propoxyphene Other (See Comments)  . Quinolones Other (See Comments)    Reaction:  Unknown   . Seroquel [Quetiapine Fumarate] Other (See Comments)    Reaction:  Unknown   . Serotonin Reuptake Inhibitors (Ssris) Other (See Comments)    Reaction:  Unknown   . Tramadol Nausea Only  . Ultracet [Tramadol-Acetaminophen] Other (See Comments)    Reaction:  Unknown     VITALS:  Blood pressure (!) 129/52, pulse 89, temperature 98.1 F (36.7 C), resp. rate (!) 21, height 4\' 11"  (1.499 m), weight 118.9 kg, SpO2 96 %.  PHYSICAL EXAMINATION:   Physical Exam  General exam: Alert, awake, oriented x 3 Respiratory system: crackles and diminished breath sounds at bases. Respiratory  effort normal. Cardiovascular system:RRR. No murmurs, rubs, gallops. Gastrointestinal system: Abdomen is nondistended, soft and nontender. No organomegaly or masses felt. Normal bowel sounds heard. Central nervous system: Alert and oriented. No focal neurological deficits. Extremities: 2+ edema bilaterally Skin: No rashes, lesions or ulcers Psychiatry: Judgement and insight appear normal. Mood & affect appropriate.       LABORATORY PANEL:  CBC No results for input(s): WBC, HGB, HCT, PLT in  the last 168 hours.  Chemistries  No results for input(s): NA, K, CL, CO2, GLUCOSE, BUN, CREATININE, CALCIUM, MG, AST, ALT, ALKPHOS, BILITOT in the last 168 hours.  Invalid input(s): GFRCGP Cardiac Enzymes No results for input(s): TROPONINI in the last 168 hours. RADIOLOGY:  No results found. ASSESSMENT AND PLAN:   87/F morbidly obese, chronically ill with multiple medical problems namely CAD, chronic diastolic CHF, COPD, chronic lymphedema, CKD stage III AAA, type 2 diabetes mellitus, hypertension  presented to the emergency room with worsening dyspnea on exertion over the last 1 week, intermittent chest pain, worsening lower extremity edema and productive cough. she was noted to be hypoxic with evidence of fluid overload, CTA chest was negative for PE, subsequently started on BiPAP and IV Lasix.  Acute on chronic hypoxic respiratory failure secondary to acute on chronic diastolic CHF/COPD/CXR? ARDS Morbid obesity with sleep apnea -- requires BiPAP nightly and intermittently -- CTA on admission consistent with fluid overload. No PE --5/24-- patient and patient's son [name] along with daughter-in-law on the phone had a long conversation with palliative care. Given multiple comorbidities and will all long term poor prognosis patient desires to be comfort care. She said go to rehab. Comfort care orders placed by palliative care. Patient is DNR DNI. -- Will continue comfort care meds.  --discontinue lasix and aldactone  AK I on CKD stage IIIa -- baseline creatinine 1.1 -- creatinine has been ranging 1.4 to 1.6  Chest pain elevated troponin with history of CAD -- suspect demand ischemia  History of TIA -- Plavix on hold due to hemoptysis  Type II diabetes with CKD stage IIIa -discontinue insulin and cbg checks at patient's request  Gerd   Chronic lymphedema/obesity  5/21--Multiple comorbidities. PT OT input appreciated  improving slowly. She is nearing baseline. She is back  on all her oral medication. Patient is recommended rehab. Patient has three that offers. 5/21--Discussed with patient's son [name] regarding discharge planning. Will continue to monitor sugars over the weekend hopefully discharge on Monday if patient accepts the bed. 5/22--sugars going up. Overall stable 5/23-- discussed with pulmonology. Recommend palliative care consultation. Patient was seen by palliative care plans to meet with patient and son tomorrow. Overall declining condition in the last several months. 5/24--pt  is transitioned  to comfort care. Patient and family request hospice facility 5/25--currently awaiting a bed at residential hospice facility 5/28--adjusting morphine for shortness of breath. Patient wishes to continue lasix and insulin for now. Continue to wait on residential hospice 5/30--started on morphine infusion for persistent shortness of breath  Family communication :discussed with son at the bedside Consults : pulmonary, cardiology, palliative care CODE STATUS: DNR, comfort measures DVT Prophylaxis : None Level of care: Progressive Cardiac Status is: Inpatient     Dispo: The patient is from: Home              Anticipated d/c is to: Hospice facility when bed opens up   [place]: 25 minutes.  >50% time spent on counselling and coordination of  care  Note: This dictation was prepared with Dragon dictation along with smaller phrase technology. Any transcriptional errors that result from this process are unintentional.  Kathie Dike M.D    [facility]   CC: Primary care physician; Adin Hector, MDPatient ID: [name], female   DOB: Jan 17, 1934, 87 y.o.   MRN: [id number]

## 2020-11-20 NOTE — Progress Notes (Signed)
[facility]) Hospice hospital liaison note  Patient has been approved for hospice home however unfortunately we do not have a bed to offer today, TOC is aware.  Hospital liaison will continue to follow along and update tomorrow or sooner if a bed becomes available.   Thank you for the opportunity to participate in this patient's care.   Please call with any hospice related questions or concerns.  Jhonnie Garner, Therapist, sports, BSN, [facility] [phone]

## 2020-11-21 ENCOUNTER — Encounter: Payer: Medicare Other | Admitting: Occupational Therapy

## 2020-11-21 DIAGNOSIS — Z515 Encounter for palliative care: Secondary | ICD-10-CM

## 2020-11-21 DIAGNOSIS — E1140 Type 2 diabetes mellitus with diabetic neuropathy, unspecified: Secondary | ICD-10-CM | POA: Diagnosis not present

## 2020-11-21 DIAGNOSIS — N1831 Chronic kidney disease, stage 3a: Secondary | ICD-10-CM | POA: Diagnosis not present

## 2020-11-21 DIAGNOSIS — J9621 Acute and chronic respiratory failure with hypoxia: Secondary | ICD-10-CM | POA: Diagnosis not present

## 2020-11-21 DIAGNOSIS — I10 Essential (primary) hypertension: Secondary | ICD-10-CM | POA: Diagnosis not present

## 2020-11-21 DIAGNOSIS — I5033 Acute on chronic diastolic (congestive) heart failure: Secondary | ICD-10-CM | POA: Diagnosis present

## 2020-11-21 MED ORDER — LORAZEPAM 2 MG/ML IJ SOLN: 1.0000 mg | INTRAMUSCULAR | 0 refills | Status: AC | PRN

## 2020-11-21 MED ORDER — MORPHINE SULFATE (PF) 2 MG/ML IV SOLN: 2.0000 mg | INTRAVENOUS | 0 refills | Status: AC | PRN

## 2020-11-22 NOTE — Progress Notes (Deleted)
Daily Progress Note   Patient Name: Teresa Krueger       Date: 12-Dec-2020 DOB: [birth year]  Age: 87 y.o. MRN#: [id number] Attending Physician: Kathie Dike, MD Primary Care Physician: Adin Hector, MD Admit Date: 11/20/2020  Reason for Consultation/Follow-up: Terminal Care  Subjective:  Length of Stay: 25  Current Medications: Scheduled Meds:  . ALPRAZolam  0.5 mg Oral BID  . capsaicin   Topical BID  . chlorhexidine  15 mL Mouth Rinse BID  . lidocaine  1 patch Transdermal Q24H  . mouth rinse  15 mL Mouth Rinse q12n4p  .  morphine injection  2 mg Intravenous Once  . mupirocin ointment   Topical BID  . pantoprazole  40 mg Oral Daily  . triamcinolone cream   Topical BID    Continuous Infusions: . morphine 1 mg/hr (11/20/20 1050)    PRN Meds: acetaminophen **OR** acetaminophen, antiseptic oral rinse, bisacodyl, glycopyrrolate **OR** glycopyrrolate **OR** glycopyrrolate, haloperidol **OR** haloperidol **OR** haloperidol lactate, levalbuterol, LORazepam **OR** LORazepam **OR** LORazepam, magnesium hydroxide, morphine, ondansetron **OR** ondansetron (ZOFRAN) IV, polyvinyl alcohol, simethicone, sodium chloride  Physical Exam Constitutional:      Comments: Pulseless and apneic.              Vital Signs: BP (!) 129/44 (BP Location: Right Arm)   Pulse 86   Temp 97.8 F (36.6 C) (Oral)   Resp 18   Ht 4\' 11"  (1.499 m)   Wt 118.9 kg   SpO2 100%   BMI 52.94 kg/m  SpO2: SpO2: 100 % O2 Device: O2 Device: Nasal Cannula O2 Flow Rate: O2 Flow Rate (L/min): 6 L/min  Intake/output summary:   Intake/Output Summary (Last 24 hours) at December 12, 2020 1534 Last data filed at 12-Dec-2020 0945 Gross per 24 hour  Intake 410 ml  Output 400 ml  Net 10 ml   LBM: Last BM Date:  11/12/20 Baseline Weight: Weight: 117 kg Most recent weight: Weight: 118.9 kg        Flowsheet Rows   Flowsheet Row Most Recent Value  Intake Tab   Referral Department Hospitalist  Unit at Time of Referral Cardiac/Telemetry Unit  Date Notified 11/13/20  Palliative Care Type New Palliative care  Reason for referral Clarify Goals of Care  Date of Admission 10/25/2020  Date first seen by Palliative Care 11/13/20  #  of days Palliative referral response time 0 Day(s)  # of days IP prior to Palliative referral 17  Clinical Assessment   Psychosocial & Spiritual Assessment   Palliative Care Outcomes       Patient Active Problem List   Diagnosis Date Noted  . Hospice care patient 12/17/2020  . Angina at rest [facility]) 10/23/2020  . CHF (congestive heart failure) ([place]) 11/11/2020  . Acute on chronic respiratory failure with hypoxia ([place]) 11/16/2020  . Pedal edema 05/02/2020  . Lightheadedness 03/20/2018  . Syncope 03/09/2018  . CKD (chronic kidney disease) stage 3, GFR 30-59 ml/min ([facility]) 02/14/2017  . Carotid artery stenosis 10/29/2016  . TIA (transient ischemic attack) 10/14/2016  . Chronic respiratory failure with hypoxia ([place]) 05/24/2016  . Aortic stenosis, mild 05/02/2016  . SOB (shortness of breath) on exertion 05/02/2016  . Swelling of limb 04/10/2016  . Varicose veins of leg with pain, bilateral 04/10/2016  . Lymphedema 04/10/2016  . Osteoarthritis 03/19/2016  . Osteoporosis 03/19/2016  . Coronary artery calcification seen on CAT scan 02/08/2016  . Incidental lung nodule 02/08/2016  . Thoracic aortic atherosclerosis ([place]) 02/08/2016  . S/P placement of cardiac pacemaker 03/30/2015  . Morbid obesity ([place]) 03/17/2015  . Hypersomnia with sleep apnea 03/17/2015  . Mobitz type 2 second degree AV block 03/16/2015  . Multiple pulmonary nodules 09/13/2014  . Lung field abnormal 09/13/2014  . Barrett's esophagus 12/21/2013  . History of adenomatous polyp of colon 12/21/2013  . Hx  of adenomatous colonic polyps 12/21/2013  . Benign essential hypertension 11/30/2013  . Acromioclavicular joint arthritis 11/23/2013  . Glenohumeral arthritis 11/23/2013  . Chest pain 10/21/2013  . Adult body mass index 50.0-59.9 ([place]) 02/09/2013  . Morbid obesity with BMI of 50.0-59.9, adult ([place]) 02/09/2013  . Primary localized osteoarthrosis, lower leg 02/09/2013  . Bronchiectasis ([place]) 05/19/2012  . Chronic bronchitis 12/31/2011  . Cough 12/31/2011  . GERD (gastroesophageal reflux disease) 12/31/2011  . Diabetes mellitus ([place])   . Hypertension     Palliative Care Assessment & Plan    Recommendations/Plan:  Hospice facility  No changes to symptom management.   Code Status:    Code Status Orders  (From admission, onward)         Start     Ordered   11/14/20 1147  Do not attempt resuscitation (DNR)  Continuous       Question Answer Comment  In the event of cardiac or respiratory ARREST Do not call a "code blue"   In the event of cardiac or respiratory ARREST Do not perform Intubation, CPR, defibrillation or ACLS   In the event of cardiac or respiratory ARREST Use medication by any route, position, wound care, and other measures to relive pain and suffering. May use oxygen, suction and manual treatment of airway obstruction as needed for comfort.   Comments MOST form in chart.      11/14/20 1148        Code Status History    Date Active Date Inactive Code Status Order ID Comments User Context   10/29/2020 0807 11/14/2020 1148 DNR [id number]  Sidney Ace, MD Inpatient   10/28/2020 0002 10/29/2020 0807 Full Code [id number]  Elwyn Reach, MD Inpatient   03/09/2018 1637 03/10/2018 1606 Full Code [id number]  Demetrios Loll, MD Inpatient   10/14/2016 2248 10/15/2016 1905 Full Code [id number]  Dustin Flock, MD Inpatient   03/21/2015 1609 03/22/2015 2130 Full Code [id number]  Isaias Cowman, MD Inpatient   03/16/2015 2253 03/21/2015 1609 Full Code  [id number]  Hower, Aaron Mose,  MD ED   Advance Care Planning Activity          Care plan was discussed with [facility].   Thank you for allowing the Palliative Medicine Team to assist in the care of this patient.   Total Time 15 min Prolonged Time Billed no      Greater than 50%  of this time was spent counseling and coordinating care related to the above assessment and plan.  Asencion Gowda, NP  Please contact Palliative Medicine Team phone at [phone] for questions and concerns.

## 2020-11-22 NOTE — Plan of Care (Signed)
PMT note:  In to check on patient prior to D/C to ensure symptom management. Patient noted to be expired on my entry. No family at bedside. Per nursing she died around 2:50, family is enroute, and hospice liaison is aware.   No charge.

## 2020-11-22 NOTE — Death Summary Note (Signed)
DEATH SUMMARY   Patient Details  Name: Teresa Krueger MRN: [id number] DOB: 1934/04/14  Admission/Discharge Information   Admit Date:  11-07-20  Date of Death: Date of Death: 12-02-20  Time of Death: Time of Death: 09-20-48  Length of Stay: 09-27-2022  Referring Physician: Adin Hector, MD   Reason(s) for Hospitalization  Acute on chronic respiratory failure with hypoxia  Diagnoses  Preliminary cause of death: Acute on chronic respiratory failure with hypoxia Secondary Diagnoses (including complications and co-morbidities):  Principal Problem:   Acute on chronic respiratory failure with hypoxia ([place]) Active Problems:   Diabetes mellitus ([place])   GERD (gastroesophageal reflux disease)   Morbid obesity ([place])   Benign essential hypertension   Lymphedema   CKD (chronic kidney disease) stage 3, GFR 30-59 ml/min (HCC)   Angina at rest [facility])   CHF (congestive heart failure) [facility])   Hospice care patient   Brief Hospital Course (including significant findings, care, treatment, and services provided and events leading to death)  Teresa Krueger is a 87 y.o. year old female who had a history of chronic hypoxic respiratory failure, chronic diastolic congestive heart failure, chronic kidney disease stage III, morbid obesity, COPD, admitted to the hospital with worsening shortness of breath.  She was requiring BiPAP for respiratory support.  CT angiogram was negative for pulmonary embolism but did show fluid overload.  She was seen by cardiology and pulmonology and attempts were made for adequate diuresis.  Despite aggressive diuresis, her shortness of breath persisted.  This was having a significant impact on her quality of life.  Palliative care was consulted to discuss goals of care.  After discussion with the patient and her son, it was noted that she has had a general decline over the past several months.  Decision was made to transition care towards her comfort.  She received morphine for  dyspnea.  Plans were to transition to residential hospice once her room became available.  Morphine drip was initiated due to persistent symptoms.  Patient passed away while she was in the hospital, prior to transfer to residential hospice.  Family was informed.    Pertinent Labs and Studies  Significant Diagnostic Studies DG Chest 2 View  Result Date: Nov 07, 2020 CLINICAL DATA:  Shortness of breath EXAM: CHEST - 2 VIEW COMPARISON:  December 07, 2018. FINDINGS: There is no edema or airspace opacity. There is slight atelectasis in the left mid lung. Heart is upper normal in size with pacemaker leads attached to the right atrium and right ventricle. No adenopathy. There is aortic atherosclerosis. There is degenerative change in the thoracic spine. IMPRESSION: Slight left midlung atelectasis. No edema or airspace opacity. Heart upper normal in size with pacemaker leads attached to right atrium and right ventricle. Aortic Atherosclerosis (ICD10-I70.0). Electronically Signed   By: Lowella Grip III M.D.   On: 11-07-2020 17:08   CT CHEST WO CONTRAST  Result Date: 11/04/2020 CLINICAL DATA:  Respiratory failure, worsening hypoxia, CHF, concern for infectious process EXAM: CT CHEST WITHOUT CONTRAST TECHNIQUE: Multidetector CT imaging of the chest was performed following the standard protocol without IV contrast. COMPARISON:  10/28/2020 FINDINGS: Cardiovascular: Aortic atherosclerosis. Right chest multi lead pacer. Cardiomegaly. Left and right coronary artery calcifications. No pericardial effusion. Mediastinum/Nodes: No enlarged mediastinal, hilar, or axillary lymph nodes. Thyroid gland, trachea, and esophagus demonstrate no significant findings. Lungs/Pleura: Small to moderate bilateral pleural effusions and associated atelectasis or consolidation, slightly increased compared to prior examination. Very extensive heterogeneous and ground-glass airspace opacity throughout  the lungs, substantially increased compared  to prior examination. Mild interlobular septal thickening. Upper Abdomen: No acute abnormality. Musculoskeletal: No chest wall mass or suspicious bone lesions identified. IMPRESSION: 1. Small to moderate bilateral pleural effusions and associated atelectasis or consolidation, slightly increased compared to prior examination. 2. Very extensive heterogeneous and ground-glass airspace opacity throughout the lungs, substantially increased compared to prior examination. Mild interlobular septal thickening. Findings are consistent with worsened multifocal infection, edema, and/or ARDS. 3. Cardiomegaly and coronary artery disease. Aortic Atherosclerosis (ICD10-I70.0). Electronically Signed   By: Eddie Candle M.D.   On: 11/04/2020 16:32   CT ANGIO CHEST PE W OR WO CONTRAST  Result Date: 10/28/2020 CLINICAL DATA:  87 year old female with concern for pulmonary embolism. EXAM: CT ANGIOGRAPHY CHEST WITH CONTRAST TECHNIQUE: Multidetector CT imaging of the chest was performed using the standard protocol during bolus administration of intravenous contrast. Multiplanar CT image reconstructions and MIPs were obtained to evaluate the vascular anatomy. CONTRAST:  41mL OMNIPAQUE IOHEXOL 350 MG/ML SOLN COMPARISON:  Chest CT dated 02/07/2016. FINDINGS: Cardiovascular: Mild cardiomegaly. No pericardial effusion. There is coronary vascular calcification and calcification of the mitral annulus. Right pectoral pacemaker device. Advanced atherosclerotic calcification of the thoracic aorta. No aneurysmal dilatation. Evaluation of the pulmonary arteries is limited due to respiratory motion artifact. No large or central pulmonary artery embolus identified. Mediastinum/Nodes: No hilar or mediastinal adenopathy. The esophagus is grossly unremarkable. No mediastinal fluid collection. Lungs/Pleura: There are small bilateral pleural effusions with bibasilar atelectasis. Diffuse interstitial and interlobular septal prominence and patchy bilateral  ground-glass opacities consistent with vascular congestion and edema. Scattered ground-glass nodularity may represent edema or pneumonia. Clinical correlation is recommended. No pneumothorax. The central airways are patent. Upper Abdomen: There is a 15 mm hypodense lesion in the right lobe of the liver. Cholecystectomy. Indeterminate subcentimeter left renal hypodense lesion. Musculoskeletal: Osteopenia with scoliosis and degenerative changes of the spine. No acute osseous pathology. Review of the MIP images confirms the above findings. IMPRESSION: 1. No CT evidence of central pulmonary artery embolus. 2. Mild cardiomegaly with findings of CHF and small bilateral pleural effusions. Scattered ground-glass nodularity may represent edema or pneumonia. Clinical correlation is recommended. 3. Aortic Atherosclerosis (ICD10-I70.0). Electronically Signed   By: Anner Crete M.D.   On: 10/28/2020 01:14   DG Chest Port 1 View  Result Date: 11/13/2020 CLINICAL DATA:  87 year old female with shortness of breath. EXAM: PORTABLE CHEST 1 VIEW COMPARISON:  11/07/2020 portable chest and earlier. FINDINGS: Portable AP semi upright view at 0808 hours. Stable lung volumes and mediastinal contours. Stable right chest cardiac pacemaker. Calcified aortic atherosclerosis. Visualized tracheal air column is within normal limits. No pneumothorax. Small bilateral pleural effusions as seen by CT 11/04/2020 suspected to persist. Mildly improved widespread patchy and confluent bilateral pulmonary opacity, ventilation most improved in the left upper lung. No areas of worsening ventilation. No acute osseous abnormality identified. IMPRESSION: Mildly improved ventilation since 11/04/2020, especially in the left upper lung. Appearance remains indeterminate for asymmetric edema versus bilateral pneumonia versus ARDS. Small bilateral pleural effusions likely persist. Electronically Signed   By: Genevie Ann M.D.   On: 11/13/2020 08:45   DG Chest  Port 1 View  Result Date: 11/07/2020 CLINICAL DATA:  Shortness of breath EXAM: PORTABLE CHEST 1 VIEW COMPARISON:  11/03/2020, CT chest 11/04/2020 FINDINGS: Right-sided pacing device as before. Small bilateral pleural effusions. Cardiomegaly with extensive bilateral consolidations and ground-glass opacities, worse as compared with 513. Aortic atherosclerosis. No pneumothorax IMPRESSION: Cardiomegaly with widespread consolidations and ground-glass opacities,  worse as compared with 11/03/2020. Small pleural effusions. Electronically Signed   By: Donavan Foil M.D.   On: 11/07/2020 17:03   DG Chest Port 1 View  Result Date: 11/03/2020 CLINICAL DATA:  Hypoxia. EXAM: PORTABLE CHEST 1 VIEW COMPARISON:  Radiographs 11/16/2020 and 11/11/2020.  CT 10/28/2020. FINDINGS: 0934 hours. Right subclavian pacemaker leads appear unchanged over the right atrium and right ventricle. There is stable mild cardiomegaly and aortic atherosclerosis. There are progressive nodular airspace opacities in both lungs with small bilateral pleural effusions. No evidence of pneumothorax. The bones appear unchanged. IMPRESSION: Progressive nodular airspace opacities in both lungs suspicious for multifocal pneumonia, possibly viral in etiology. There may be a component of pulmonary edema as well, and small bilateral pleural effusions are present. Electronically Signed   By: Richardean Sale M.D.   On: 11/03/2020 10:04   DG Chest Port 1 View  Result Date: 11/13/2020 CLINICAL DATA:  87 year old female with wheezing. EXAM: PORTABLE CHEST 1 VIEW COMPARISON:  Chest radiograph dated 10/26/2020. FINDINGS: There is cardiomegaly with vascular congestion and edema. Pneumonia is not excluded clinical correlation recommended. Small bilateral pleural effusion. No pneumothorax. Atherosclerotic calcification of the aorta. Right pectoral pacemaker device. No acute osseous pathology. IMPRESSION: Cardiomegaly with findings of CHF. Pneumonia is not excluded.  Electronically Signed   By: Anner Crete M.D.   On: 11/17/2020 02:26   ECHOCARDIOGRAM COMPLETE  Result Date: 10/29/2020    ECHOCARDIOGRAM REPORT   Patient Name:   FALISHA OSMENT [facility] Date of Exam: 11/11/2020 Medical Rec #:  [id number]        Height:       59.0 in Accession #:    [id number]       Weight:       275.8 lb Date of Birth:  Mar 12, 1934        BSA:          2.114 m Patient Age:    63 years         BP:           128/75 mmHg Patient Gender: F                HR:           78 bpm. Exam Location:  [facility] Procedure: 2D Echo, Color Doppler and Cardiac Doppler Indications:     CHF- acute diastolic G38.75  History:         Patient has prior history of Echocardiogram examinations, most                  recent 03/17/2015. Pacemaker, COPD; Risk Factors:Hypertension                  and Diabetes.  Sonographer:     Sherrie Sport RDCS (AE) Referring Phys:  [place] Diagnosing Phys: Isaias Cowman MD IMPRESSIONS  1. Left ventricular ejection fraction, by estimation, is 55 to 60%. The left ventricle has normal function. The left ventricle has no regional wall motion abnormalities. Left ventricular diastolic parameters were normal.  2. Right ventricular systolic function is normal. The right ventricular size is normal.  3. The mitral valve is normal in structure. Mild to moderate mitral valve regurgitation. No evidence of mitral stenosis.  4. The aortic valve is normal in structure. Aortic valve regurgitation is moderate. No aortic stenosis is present.  5. The inferior vena cava is normal in size with greater than 50% respiratory variability, suggesting right atrial pressure of 3 mmHg. FINDINGS  Left  Ventricle: Left ventricular ejection fraction, by estimation, is 55 to 60%. The left ventricle has normal function. The left ventricle has no regional wall motion abnormalities. The left ventricular internal cavity size was normal in size. There is  no left ventricular hypertrophy. Left ventricular diastolic  parameters were normal. Right Ventricle: The right ventricular size is normal. No increase in right ventricular wall thickness. Right ventricular systolic function is normal. Left Atrium: Left atrial size was normal in size. Right Atrium: Right atrial size was normal in size. Pericardium: There is no evidence of pericardial effusion. Mitral Valve: The mitral valve is normal in structure. Mild to moderate mitral valve regurgitation. No evidence of mitral valve stenosis. Tricuspid Valve: The tricuspid valve is normal in structure. Tricuspid valve regurgitation is mild . No evidence of tricuspid stenosis. Aortic Valve: The aortic valve is normal in structure. Aortic valve regurgitation is moderate. No aortic stenosis is present. Aortic valve mean gradient measures 15.7 mmHg. Aortic valve peak gradient measures 27.2 mmHg. Aortic valve area, by VTI measures  0.86 cm. Pulmonic Valve: The pulmonic valve was normal in structure. Pulmonic valve regurgitation is not visualized. No evidence of pulmonic stenosis. Aorta: The aortic root is normal in size and structure. Venous: The inferior vena cava is normal in size with greater than 50% respiratory variability, suggesting right atrial pressure of 3 mmHg. IAS/Shunts: No atrial level shunt detected by color flow Doppler.  LEFT VENTRICLE PLAX 2D LVIDd:         5.12 cm  Diastology LVIDs:         3.55 cm  LV e' medial:    3.59 cm/s LV PW:         1.40 cm  LV E/e' medial:  41.5 LV IVS:        0.99 cm  LV e' lateral:   5.87 cm/s LVOT diam:     2.00 cm  LV E/e' lateral: 25.4 LV SV:         52 LV SV Index:   25 LVOT Area:     3.14 cm  RIGHT VENTRICLE RV Basal diam:  4.07 cm RV S prime:     14.10 cm/s TAPSE (M-mode): 4.6 cm LEFT ATRIUM             Index       RIGHT ATRIUM           Index LA diam:        4.60 cm 2.18 cm/m  RA Area:     21.00 cm LA Vol (A2C):   89.2 ml 42.19 ml/m RA Volume:   60.40 ml  28.57 ml/m LA Vol (A4C):   78.7 ml 37.22 ml/m LA Biplane Vol: 83.9 ml 39.68  ml/m  AORTIC VALVE                    PULMONIC VALVE AV Area (Vmax):    0.94 cm     PV Vmax:        0.89 m/s AV Area (Vmean):   0.96 cm     PV Peak grad:   3.1 mmHg AV Area (VTI):     0.86 cm     RVOT Peak grad: 3 mmHg AV Vmax:           261.00 cm/s AV Vmean:          184.333 cm/s AV VTI:            0.606 m AV Peak Grad:  27.2 mmHg AV Mean Grad:      15.7 mmHg LVOT Vmax:         77.70 cm/s LVOT Vmean:        56.100 cm/s LVOT VTI:          0.166 m LVOT/AV VTI ratio: 0.27  AORTA Ao Root diam: 2.70 cm MITRAL VALVE                TRICUSPID VALVE MV Area (PHT): 5.58 cm     TR Peak grad:   40.7 mmHg MV Decel Time: 136 msec     TR Vmax:        319.00 cm/s MV E velocity: 149.00 cm/s MV A velocity: 127.00 cm/s  SHUNTS MV E/A ratio:  1.17         Systemic VTI:  0.17 m                             Systemic Diam: 2.00 cm Isaias Cowman MD Electronically signed by Isaias Cowman MD Signature Date/Time: 11/09/2020/1:07:34 PM    Final    US LIVER DOPPLER  Result Date: 11/09/2020 CLINICAL DATA:  Non alcoholic steatohepatitis EXAM: DUPLEX ULTRASOUND OF LIVER TECHNIQUE: Color and duplex Doppler ultrasound was performed to evaluate the hepatic in-flow and out-flow vessels. Technologist describes technically difficult study secondary to labored breathing, patient on non-rebreather mask. COMPARISON:  CT 09/10/2019 and previous FINDINGS: Liver: Normal parenchymal echogenicity. Normal hepatic contour without nodularity. 2.4 x 1.7 x 1.4 cm simple appearing cyst in the lateral left hepatic segment as before. Main Portal Vein size: 1.2 cm Portal Vein Velocities (all hepatopetal): Main Prox:  48 cm/sec Main Mid: 37 cm/sec Main Dist:  39 cm/sec Right: 21 cm/sec Left: 9 cm/sec Hepatic Vein Velocities (all hepatofugal): Right:  16 cm/sec Middle:  22 cm/sec Left:  25 cm/sec IVC: Present and patent with normal respiratory phasicity. Velocity 27 cm/seconds. Diameter 1.3 cm. Hepatic Artery Velocity:  58 cm/sec Splenic Vein  Velocity:  26 cm/sec Spleen: 9.7 cm x 3.7 cm x 11.5 cm with a total volume of 10/23/2016 cm^3 (411 cm^3 is upper limit normal) Portal Vein Occlusion/Thrombus: No Splenic Vein Occlusion/Thrombus: No Ascites: None Varices: None IMPRESSION: 1. Unremarkable hepatic vascular Doppler evaluation. 2. Stable simple appearing cyst in the lateral left hepatic segment. Electronically Signed   By: Lucrezia Europe M.D.   On: 11/09/2020 11:38    Microbiology Recent Results (from the past 240 hour(s))  Resp Panel by RT-PCR (Flu A&B, Covid) Nasopharyngeal Swab     Status: None   Collection Time: 11/13/20  2:58 PM   Specimen: Nasopharyngeal Swab; Nasopharyngeal(NP) swabs in vial transport medium  Result Value Ref Range Status   SARS Coronavirus 2 by RT PCR NEGATIVE NEGATIVE Final    Comment: (NOTE) SARS-CoV-2 target nucleic acids are NOT DETECTED.  The SARS-CoV-2 RNA is generally detectable in upper respiratory specimens during the acute phase of infection. The lowest concentration of SARS-CoV-2 viral copies this assay can detect is 138 copies/mL. A negative result does not preclude SARS-Cov-2 infection and should not be used as the sole basis for treatment or other patient management decisions. A negative result may occur with  improper specimen collection/handling, submission of specimen other than nasopharyngeal swab, presence of viral mutation(s) within the areas targeted by this assay, and inadequate number of viral copies(<138 copies/mL). A negative result must be combined with clinical observations, patient history, and epidemiological information. The expected result is  Negative.  Fact Sheet for Patients:  EntrepreneurPulse.com.au  Fact Sheet for Healthcare Providers:  IncredibleEmployment.be  This test is no t yet approved or cleared by the [place] FDA and  has been authorized for detection and/or diagnosis of SARS-CoV-2 by FDA under an Emergency Use  Authorization (EUA). This EUA will remain  in effect (meaning this test can be used) for the duration of the COVID-19 declaration under Section 564(b)(1) of the Act, 21 U.S.C.section 360bbb-3(b)(1), unless the authorization is terminated  or revoked sooner.       Influenza A by PCR NEGATIVE NEGATIVE Final   Influenza B by PCR NEGATIVE NEGATIVE Final    Comment: (NOTE) The Xpert Xpress SARS-CoV-2/FLU/RSV plus assay is intended as an aid in the diagnosis of influenza from Nasopharyngeal swab specimens and should not be used as a sole basis for treatment. Nasal washings and aspirates are unacceptable for Xpert Xpress SARS-CoV-2/FLU/RSV testing.  Fact Sheet for Patients: EntrepreneurPulse.com.au  Fact Sheet for Healthcare Providers: IncredibleEmployment.be  This test is not yet approved or cleared by the [place] FDA and has been authorized for detection and/or diagnosis of SARS-CoV-2 by FDA under an Emergency Use Authorization (EUA). This EUA will remain in effect (meaning this test can be used) for the duration of the COVID-19 declaration under Section 564(b)(1) of the Act, 21 U.S.C. section 360bbb-3(b)(1), unless the authorization is terminated or revoked.  Performed at [facility], [address] [place], [place] [address]     Lab Basic Metabolic Panel: No results for input(s): NA, K, CL, CO2, GLUCOSE, BUN, CREATININE, CALCIUM, MG, PHOS in the last 168 hours. Liver Function Tests: No results for input(s): AST, ALT, ALKPHOS, BILITOT, PROT, ALBUMIN in the last 168 hours. No results for input(s): LIPASE, AMYLASE in the last 168 hours. No results for input(s): AMMONIA in the last 168 hours. CBC: No results for input(s): WBC, NEUTROABS, HGB, HCT, MCV, PLT in the last 168 hours. Cardiac Enzymes: No results for input(s): CKTOTAL, CKMB, CKMBINDEX, TROPONINI in the last 168 hours. Sepsis Labs: No results for input(s):  PROCALCITON, WBC, LATICACIDVEN in the last 168 hours.  Procedures/Operations     [employer] 12/13/20, 7:21 PM

## 2020-11-22 NOTE — Discharge Summary (Signed)
Physician Discharge Summary  LYLIAN SANAGUSTIN HFW:[id number] DOB: 09-10-1933 DOA: 10/23/2020  PCP: Adin Hector, MD  Admit date: 11/04/2020 Discharge date: 12/03/2020  Admitted From: home Disposition:  Residential hospice  Recommendations for Outpatient Follow-up:  1. Patient is being discharged to residential hospice for end of life care  Discharge Condition: terminal CODE STATUS: DNR, comfort measures Diet recommendation: regular diet for comfort  Brief/Interim Summary: 87 y/o female with morbid obesity, and multiple medical problems including chronic diastolic CHF, COPD, CKD 3, DM, HTN, chronic lymphedema, presented with worsening shortness of breath on exertion. She was noted to be hypoxic and had evidence of volume overload.   Discharge Diagnoses:  Principal Problem:   Acute on chronic respiratory failure with hypoxia (HCC) Active Problems:   Diabetes mellitus (HCC)   GERD (gastroesophageal reflux disease)   Morbid obesity (HCC)   Benign essential hypertension   Lymphedema   CKD (chronic kidney disease) stage 3, GFR 30-59 ml/min (HCC)   Angina at rest [facility])   CHF (congestive heart failure) ([place])   Hospice care patient   Acute on chronic hypoxic respiratory failure secondary to acute on chronic diastolic CHF/COPD/CXR? ARDS Morbid obesity with sleep apnea -- required BiPAP nightly and intermittently -- CTA on admission consistent with fluid overload. No PE --5/24-- patient and patient's son [name] along with daughter-in-law on the phone had a long conversation with palliative care. Given multiple comorbidities and  long term poor prognosis, patient desired to be comfort care. Comfort care orders placed by palliative care. Patient is DNR DNI. -- Will continue comfort care meds.  --discontinue lasix and aldactone  AK I on CKD stage IIIa -- baseline creatinine 1.1 -- creatinine has been ranging 1.4 to 1.6  Chest pain elevated troponin with history of CAD --  suspect demand ischemia  History of TIA -- Plavix on hold due to hemoptysis  Type II diabetes with CKD stage IIIa -discontinue insulin and cbg checks at patient's request  Jerrye Bushy   Chronic lymphedema/obesity  5/23-- discussed with pulmonology. Recommend palliative care consultation. Patient and son met with palliative care. It was reported that patient had been Overall declining in the last several months. 5/24--pt  was transitioned  to comfort care. Patient and family request hospice facility 5/30--started on morphine infusion for persistent shortness of breath 5/31- patient to be discharged to residential hospice facility  Discharge Instructions  Discharge Instructions    Diet - low sodium heart healthy   Complete by: As directed    Increase activity slowly   Complete by: As directed      Allergies as of 12/03/20      Reactions   Aspirin Swelling, Other (See Comments)   Pt states that she has tongue swelling.     Celebrex [celecoxib] Anaphylaxis   Meloxicam Swelling   Nsaids Anaphylaxis, Other (See Comments), Nausea And Vomiting   unknown   Quetiapine Anaphylaxis   intolerant   Rofecoxib Anaphylaxis   Sulfa Antibiotics Hives, Itching, Anaphylaxis, Nausea And Vomiting, Other (See Comments)   Tolmetin Anaphylaxis   Bactrim [sulfamethoxazole-trimethoprim] Hives, Itching   Biguanide Copolymer Other (See Comments)   Reaction:  Unknown    Buprenorphine Hcl Other (See Comments)   Reaction:  Unknown    Cetirizine Other (See Comments)   Ciprofloxacin Other (See Comments)   Reaction:  Unknown    Codeine Nausea And Vomiting, Other (See Comments)   Reaction:  Syncope    Dextromethorphan Hbr Other (See Comments)   Reaction:  GI  upset    Dextromethorphan Polistirex Er Other (See Comments)   Reaction:  GI upset  GI upset   Furosemide Other (See Comments)   Reaction:  Unknown    Hydrocodone Other (See Comments)   Reaction:  Unknown    Metformin Other (See Comments)    Reaction:  Unknown    Morphine And Related Other (See Comments)   Reaction:  Unknown    Penicillins Swelling, Other (See Comments)   Pt did not answer the additional questions for this medication because she does not remember.     Pregabalin Other (See Comments)   Reaction:  Unknown    Promethazine Other (See Comments)   Reaction:  Unknown    Propoxyphene Other (See Comments)   Reaction:  Unknown    Propoxyphene Other (See Comments)   Quinolones Other (See Comments)   Reaction:  Unknown    Seroquel [quetiapine Fumarate] Other (See Comments)   Reaction:  Unknown    Serotonin Reuptake Inhibitors (ssris) Other (See Comments)   Reaction:  Unknown    Tramadol Nausea Only   Ultracet [tramadol-acetaminophen] Other (See Comments)   Reaction:  Unknown       Medication List    STOP taking these medications   ALPRAZolam 0.5 MG tablet Commonly known as: XANAX   AMBULATORY NON FORMULARY MEDICATION   budesonide 0.5 MG/2ML nebulizer solution Commonly known as: PULMICORT   calcium carbonate 500 MG chewable tablet Commonly known as: TUMS - dosed in mg elemental calcium   clindamycin 300 MG capsule Commonly known as: CLEOCIN   clopidogrel 75 MG tablet Commonly known as: PLAVIX   insulin lispro protamine-lispro (75-25) 100 UNIT/ML Susp injection Commonly known as: HUMALOG 75/25 MIX   Klor-Con M20 20 MEQ tablet Generic drug: potassium chloride SA   losartan 25 MG tablet Commonly known as: COZAAR   MILK OF MAGNESIA PO   multivitamin with minerals Tabs tablet   mupirocin ointment 2 % Commonly known as: BACTROBAN   pantoprazole 40 MG tablet Commonly known as: PROTONIX   Pfizer-BioNTech COVID-19 Vacc 30 MCG/0.3ML injection Generic drug: COVID-19 mRNA vaccine (Pfizer)   simvastatin 20 MG tablet Commonly known as: ZOCOR   torsemide 20 MG tablet Commonly known as: DEMADEX   triamcinolone cream 0.1 % Commonly known as: KENALOG     TAKE these medications   acetaminophen  650 MG CR tablet Commonly known as: TYLENOL Take 650 mg by mouth every morning.   LORazepam 2 MG/ML injection Commonly known as: ATIVAN Inject 0.5 mLs (1 mg total) into the vein every 4 (four) hours as needed for anxiety.   morphine 2 MG/ML injection Inject 1 mL (2 mg total) into the vein every 2 (two) hours as needed.       Follow-up Information    Dew, Erskine Squibb, MD Follow up in 1 week(s).   Specialties: Vascular Surgery, Radiology, Interventional Cardiology Why: Can see Dew or Arna Medici. Will need mesenteric duplex with visit. Contact information: [place] [address] [phone]              Allergies  Allergen Reactions  . Aspirin Swelling and Other (See Comments)    Pt states that she has tongue swelling.    . Celebrex [Celecoxib] Anaphylaxis  . Meloxicam Swelling  . Nsaids Anaphylaxis, Other (See Comments) and Nausea And Vomiting    unknown   . Quetiapine Anaphylaxis    intolerant  . Rofecoxib Anaphylaxis  . Sulfa Antibiotics Hives, Itching, Anaphylaxis, Nausea And Vomiting and Other (  See Comments)  . Tolmetin Anaphylaxis  . Bactrim [Sulfamethoxazole-Trimethoprim] Hives and Itching  . Biguanide Copolymer Other (See Comments)    Reaction:  Unknown   . Buprenorphine Hcl Other (See Comments)    Reaction:  Unknown   . Cetirizine Other (See Comments)  . Ciprofloxacin Other (See Comments)    Reaction:  Unknown   . Codeine Nausea And Vomiting and Other (See Comments)    Reaction:  Syncope   . Dextromethorphan Hbr Other (See Comments)    Reaction:  GI upset   . Dextromethorphan Polistirex Er Other (See Comments)    Reaction:  GI upset  GI upset  . Furosemide Other (See Comments)    Reaction:  Unknown   . Hydrocodone Other (See Comments)    Reaction:  Unknown   . Metformin Other (See Comments)    Reaction:  Unknown   . Morphine And Related Other (See Comments)    Reaction:  Unknown   . Penicillins Swelling and Other (See Comments)    Pt did  not answer the additional questions for this medication because she does not remember.    . Pregabalin Other (See Comments)    Reaction:  Unknown   . Promethazine Other (See Comments)    Reaction:  Unknown   . Propoxyphene Other (See Comments)    Reaction:  Unknown   . Propoxyphene Other (See Comments)  . Quinolones Other (See Comments)    Reaction:  Unknown   . Seroquel [Quetiapine Fumarate] Other (See Comments)    Reaction:  Unknown   . Serotonin Reuptake Inhibitors (Ssris) Other (See Comments)    Reaction:  Unknown   . Tramadol Nausea Only  . Ultracet [Tramadol-Acetaminophen] Other (See Comments)    Reaction:  Unknown     Consultations:  Cardiology  Pulmonology  Palliative care   Procedures/Studies: DG Chest 2 View  Result Date: 11/04/2020 CLINICAL DATA:  Shortness of breath EXAM: CHEST - 2 VIEW COMPARISON:  December 07, 2018. FINDINGS: There is no edema or airspace opacity. There is slight atelectasis in the left mid lung. Heart is upper normal in size with pacemaker leads attached to the right atrium and right ventricle. No adenopathy. There is aortic atherosclerosis. There is degenerative change in the thoracic spine. IMPRESSION: Slight left midlung atelectasis. No edema or airspace opacity. Heart upper normal in size with pacemaker leads attached to right atrium and right ventricle. Aortic Atherosclerosis (ICD10-I70.0). Electronically Signed   By: Lowella Grip III M.D.   On: 10/25/2020 17:08   CT CHEST WO CONTRAST  Result Date: 11/04/2020 CLINICAL DATA:  Respiratory failure, worsening hypoxia, CHF, concern for infectious process EXAM: CT CHEST WITHOUT CONTRAST TECHNIQUE: Multidetector CT imaging of the chest was performed following the standard protocol without IV contrast. COMPARISON:  10/28/2020 FINDINGS: Cardiovascular: Aortic atherosclerosis. Right chest multi lead pacer. Cardiomegaly. Left and right coronary artery calcifications. No pericardial effusion.  Mediastinum/Nodes: No enlarged mediastinal, hilar, or axillary lymph nodes. Thyroid gland, trachea, and esophagus demonstrate no significant findings. Lungs/Pleura: Small to moderate bilateral pleural effusions and associated atelectasis or consolidation, slightly increased compared to prior examination. Very extensive heterogeneous and ground-glass airspace opacity throughout the lungs, substantially increased compared to prior examination. Mild interlobular septal thickening. Upper Abdomen: No acute abnormality. Musculoskeletal: No chest wall mass or suspicious bone lesions identified. IMPRESSION: 1. Small to moderate bilateral pleural effusions and associated atelectasis or consolidation, slightly increased compared to prior examination. 2. Very extensive heterogeneous and ground-glass airspace opacity throughout the lungs, substantially increased compared  to prior examination. Mild interlobular septal thickening. Findings are consistent with worsened multifocal infection, edema, and/or ARDS. 3. Cardiomegaly and coronary artery disease. Aortic Atherosclerosis (ICD10-I70.0). Electronically Signed   By: Eddie Candle M.D.   On: 11/04/2020 16:32   CT ANGIO CHEST PE W OR WO CONTRAST  Result Date: 10/28/2020 CLINICAL DATA:  87 year old female with concern for pulmonary embolism. EXAM: CT ANGIOGRAPHY CHEST WITH CONTRAST TECHNIQUE: Multidetector CT imaging of the chest was performed using the standard protocol during bolus administration of intravenous contrast. Multiplanar CT image reconstructions and MIPs were obtained to evaluate the vascular anatomy. CONTRAST:  34m OMNIPAQUE IOHEXOL 350 MG/ML SOLN COMPARISON:  Chest CT dated 02/07/2016. FINDINGS: Cardiovascular: Mild cardiomegaly. No pericardial effusion. There is coronary vascular calcification and calcification of the mitral annulus. Right pectoral pacemaker device. Advanced atherosclerotic calcification of the thoracic aorta. No aneurysmal dilatation.  Evaluation of the pulmonary arteries is limited due to respiratory motion artifact. No large or central pulmonary artery embolus identified. Mediastinum/Nodes: No hilar or mediastinal adenopathy. The esophagus is grossly unremarkable. No mediastinal fluid collection. Lungs/Pleura: There are small bilateral pleural effusions with bibasilar atelectasis. Diffuse interstitial and interlobular septal prominence and patchy bilateral ground-glass opacities consistent with vascular congestion and edema. Scattered ground-glass nodularity may represent edema or pneumonia. Clinical correlation is recommended. No pneumothorax. The central airways are patent. Upper Abdomen: There is a 15 mm hypodense lesion in the right lobe of the liver. Cholecystectomy. Indeterminate subcentimeter left renal hypodense lesion. Musculoskeletal: Osteopenia with scoliosis and degenerative changes of the spine. No acute osseous pathology. Review of the MIP images confirms the above findings. IMPRESSION: 1. No CT evidence of central pulmonary artery embolus. 2. Mild cardiomegaly with findings of CHF and small bilateral pleural effusions. Scattered ground-glass nodularity may represent edema or pneumonia. Clinical correlation is recommended. 3. Aortic Atherosclerosis (ICD10-I70.0). Electronically Signed   By: AAnner CreteM.D.   On: 10/28/2020 01:14   DG Chest Port 1 View  Result Date: 11/13/2020 CLINICAL DATA:  87year old female with shortness of breath. EXAM: PORTABLE CHEST 1 VIEW COMPARISON:  11/07/2020 portable chest and earlier. FINDINGS: Portable AP semi upright view at 0808 hours. Stable lung volumes and mediastinal contours. Stable right chest cardiac pacemaker. Calcified aortic atherosclerosis. Visualized tracheal air column is within normal limits. No pneumothorax. Small bilateral pleural effusions as seen by CT 11/04/2020 suspected to persist. Mildly improved widespread patchy and confluent bilateral pulmonary opacity, ventilation  most improved in the left upper lung. No areas of worsening ventilation. No acute osseous abnormality identified. IMPRESSION: Mildly improved ventilation since 11/04/2020, especially in the left upper lung. Appearance remains indeterminate for asymmetric edema versus bilateral pneumonia versus ARDS. Small bilateral pleural effusions likely persist. Electronically Signed   By: HGenevie AnnM.D.   On: 11/13/2020 08:45   DG Chest Port 1 View  Result Date: 11/07/2020 CLINICAL DATA:  Shortness of breath EXAM: PORTABLE CHEST 1 VIEW COMPARISON:  11/03/2020, CT chest 11/04/2020 FINDINGS: Right-sided pacing device as before. Small bilateral pleural effusions. Cardiomegaly with extensive bilateral consolidations and ground-glass opacities, worse as compared with 513. Aortic atherosclerosis. No pneumothorax IMPRESSION: Cardiomegaly with widespread consolidations and ground-glass opacities, worse as compared with 11/03/2020. Small pleural effusions. Electronically Signed   By: KDonavan FoilM.D.   On: 11/07/2020 17:03   DG Chest Port 1 View  Result Date: 11/03/2020 CLINICAL DATA:  Hypoxia. EXAM: PORTABLE CHEST 1 VIEW COMPARISON:  Radiographs 11/11/2020 and 10/29/2020.  CT 10/28/2020. FINDINGS: 0934 hours. Right subclavian pacemaker leads appear unchanged over  the right atrium and right ventricle. There is stable mild cardiomegaly and aortic atherosclerosis. There are progressive nodular airspace opacities in both lungs with small bilateral pleural effusions. No evidence of pneumothorax. The bones appear unchanged. IMPRESSION: Progressive nodular airspace opacities in both lungs suspicious for multifocal pneumonia, possibly viral in etiology. There may be a component of pulmonary edema as well, and small bilateral pleural effusions are present. Electronically Signed   By: Richardean Sale M.D.   On: 11/03/2020 10:04   DG Chest Port 1 View  Result Date: 11/02/2020 CLINICAL DATA:  87 year old female with wheezing. EXAM:  PORTABLE CHEST 1 VIEW COMPARISON:  Chest radiograph dated 10/28/2020. FINDINGS: There is cardiomegaly with vascular congestion and edema. Pneumonia is not excluded clinical correlation recommended. Small bilateral pleural effusion. No pneumothorax. Atherosclerotic calcification of the aorta. Right pectoral pacemaker device. No acute osseous pathology. IMPRESSION: Cardiomegaly with findings of CHF. Pneumonia is not excluded. Electronically Signed   By: Anner Crete M.D.   On: 11/19/2020 02:26   ECHOCARDIOGRAM COMPLETE  Result Date: 10/23/2020    ECHOCARDIOGRAM REPORT   Patient Name:   PEMA THOMURE [facility] Date of Exam: 11/14/2020 Medical Rec #:  [id number]        Height:       59.0 in Accession #:    [id number]       Weight:       275.8 lb Date of Birth:  [birth year]        BSA:          2.114 m Patient Age:    87 years         BP:           128/75 mmHg Patient Gender: F                HR:           78 bpm. Exam Location:  [facility] Procedure: 2D Echo, Color Doppler and Cardiac Doppler Indications:     CHF- acute diastolic C78.93  History:         Patient has prior history of Echocardiogram examinations, most                  recent 03/17/2015. Pacemaker, COPD; Risk Factors:Hypertension                  and Diabetes.  Sonographer:     Sherrie Sport RDCS (AE) Referring Phys:  [place] Diagnosing Phys: Isaias Cowman MD IMPRESSIONS  1. Left ventricular ejection fraction, by estimation, is 55 to 60%. The left ventricle has normal function. The left ventricle has no regional wall motion abnormalities. Left ventricular diastolic parameters were normal.  2. Right ventricular systolic function is normal. The right ventricular size is normal.  3. The mitral valve is normal in structure. Mild to moderate mitral valve regurgitation. No evidence of mitral stenosis.  4. The aortic valve is normal in structure. Aortic valve regurgitation is moderate. No aortic stenosis is present.  5. The inferior vena cava is normal in  size with greater than 50% respiratory variability, suggesting right atrial pressure of 3 mmHg. FINDINGS  Left Ventricle: Left ventricular ejection fraction, by estimation, is 55 to 60%. The left ventricle has normal function. The left ventricle has no regional wall motion abnormalities. The left ventricular internal cavity size was normal in size. There is  no left ventricular hypertrophy. Left ventricular diastolic parameters were normal. Right Ventricle: The right ventricular size is normal. No increase in  right ventricular wall thickness. Right ventricular systolic function is normal. Left Atrium: Left atrial size was normal in size. Right Atrium: Right atrial size was normal in size. Pericardium: There is no evidence of pericardial effusion. Mitral Valve: The mitral valve is normal in structure. Mild to moderate mitral valve regurgitation. No evidence of mitral valve stenosis. Tricuspid Valve: The tricuspid valve is normal in structure. Tricuspid valve regurgitation is mild . No evidence of tricuspid stenosis. Aortic Valve: The aortic valve is normal in structure. Aortic valve regurgitation is moderate. No aortic stenosis is present. Aortic valve mean gradient measures 15.7 mmHg. Aortic valve peak gradient measures 27.2 mmHg. Aortic valve area, by VTI measures  0.86 cm. Pulmonic Valve: The pulmonic valve was normal in structure. Pulmonic valve regurgitation is not visualized. No evidence of pulmonic stenosis. Aorta: The aortic root is normal in size and structure. Venous: The inferior vena cava is normal in size with greater than 50% respiratory variability, suggesting right atrial pressure of 3 mmHg. IAS/Shunts: No atrial level shunt detected by color flow Doppler.  LEFT VENTRICLE PLAX 2D LVIDd:         5.12 cm  Diastology LVIDs:         3.55 cm  LV e' medial:    3.59 cm/s LV PW:         1.40 cm  LV E/e' medial:  41.5 LV IVS:        0.99 cm  LV e' lateral:   5.87 cm/s LVOT diam:     2.00 cm  LV E/e' lateral:  25.4 LV SV:         52 LV SV Index:   25 LVOT Area:     3.14 cm  RIGHT VENTRICLE RV Basal diam:  4.07 cm RV S prime:     14.10 cm/s TAPSE (M-mode): 4.6 cm LEFT ATRIUM             Index       RIGHT ATRIUM           Index LA diam:        4.60 cm 2.18 cm/m  RA Area:     21.00 cm LA Vol (A2C):   89.2 ml 42.19 ml/m RA Volume:   60.40 ml  28.57 ml/m LA Vol (A4C):   78.7 ml 37.22 ml/m LA Biplane Vol: 83.9 ml 39.68 ml/m  AORTIC VALVE                    PULMONIC VALVE AV Area (Vmax):    0.94 cm     PV Vmax:        0.89 m/s AV Area (Vmean):   0.96 cm     PV Peak grad:   3.1 mmHg AV Area (VTI):     0.86 cm     RVOT Peak grad: 3 mmHg AV Vmax:           261.00 cm/s AV Vmean:          184.333 cm/s AV VTI:            0.606 m AV Peak Grad:      27.2 mmHg AV Mean Grad:      15.7 mmHg LVOT Vmax:         77.70 cm/s LVOT Vmean:        56.100 cm/s LVOT VTI:          0.166 m LVOT/AV VTI ratio: 0.27  AORTA Ao Root diam: 2.70 cm MITRAL  VALVE                TRICUSPID VALVE MV Area (PHT): 5.58 cm     TR Peak grad:   40.7 mmHg MV Decel Time: 136 msec     TR Vmax:        319.00 cm/s MV E velocity: 149.00 cm/s MV A velocity: 127.00 cm/s  SHUNTS MV E/A ratio:  1.17         Systemic VTI:  0.17 m                             Systemic Diam: 2.00 cm Isaias Cowman MD Electronically signed by Isaias Cowman MD Signature Date/Time: 11/10/2020/1:07:34 PM    Final    US LIVER DOPPLER  Result Date: 11/09/2020 CLINICAL DATA:  Non alcoholic steatohepatitis EXAM: DUPLEX ULTRASOUND OF LIVER TECHNIQUE: Color and duplex Doppler ultrasound was performed to evaluate the hepatic in-flow and out-flow vessels. Technologist describes technically difficult study secondary to labored breathing, patient on non-rebreather mask. COMPARISON:  CT 09/10/2019 and previous FINDINGS: Liver: Normal parenchymal echogenicity. Normal hepatic contour without nodularity. 2.4 x 1.7 x 1.4 cm simple appearing cyst in the lateral left hepatic segment as before.  Main Portal Vein size: 1.2 cm Portal Vein Velocities (all hepatopetal): Main Prox:  48 cm/sec Main Mid: 37 cm/sec Main Dist:  39 cm/sec Right: 21 cm/sec Left: 9 cm/sec Hepatic Vein Velocities (all hepatofugal): Right:  16 cm/sec Middle:  22 cm/sec Left:  25 cm/sec IVC: Present and patent with normal respiratory phasicity. Velocity 27 cm/seconds. Diameter 1.3 cm. Hepatic Artery Velocity:  58 cm/sec Splenic Vein Velocity:  26 cm/sec Spleen: 9.7 cm x 3.7 cm x 11.5 cm with a total volume of 10/23/2016 cm^3 (411 cm^3 is upper limit normal) Portal Vein Occlusion/Thrombus: No Splenic Vein Occlusion/Thrombus: No Ascites: None Varices: None IMPRESSION: 1. Unremarkable hepatic vascular Doppler evaluation. 2. Stable simple appearing cyst in the lateral left hepatic segment. Electronically Signed   By: Lucrezia Europe M.D.   On: 11/09/2020 11:38       Subjective: Does not answer questions  Discharge Exam: Vitals:   11/20/20 0426 11/20/20 0807 11/20/20 1930 12/12/20 0823  BP: 130/69 (!) 129/52 (!) 127/40 (!) 129/44  Pulse: 88 89 84 86  Resp: 14 (!) _0 Temp: 97.6 F (36.4 C) 98.1 F (36.7 C) 97.8 F (36.6 C) 97.8 F (36.6 C)  TempSrc:   Oral Oral  SpO2: 96% 96% 100% 100%  Weight:      Height:        General: Pt is somnolent Cardiovascular: RRR, S1/S2 +, no rubs, no gallops Respiratory: short shallow breaths     The results of significant diagnostics from this hospitalization (including imaging, microbiology, ancillary and laboratory) are listed below for reference.     Microbiology: Recent Results (from the past 240 hour(s))  Resp Panel by RT-PCR (Flu A&B, Covid) Nasopharyngeal Swab     Status: None   Collection Time: 11/13/20  2:58 PM   Specimen: Nasopharyngeal Swab; Nasopharyngeal(NP) swabs in vial transport medium  Result Value Ref Range Status   SARS Coronavirus 2 by RT PCR NEGATIVE NEGATIVE Final    Comment: (NOTE) SARS-CoV-2 target nucleic acids are NOT DETECTED.  The  SARS-CoV-2 RNA is generally detectable in upper respiratory specimens during the acute phase of infection. The lowest concentration of SARS-CoV-2 viral copies this assay can detect is 138 copies/mL. A negative result does  not preclude SARS-Cov-2 infection and should not be used as the sole basis for treatment or other patient management decisions. A negative result may occur with  improper specimen collection/handling, submission of specimen other than nasopharyngeal swab, presence of viral mutation(s) within the areas targeted by this assay, and inadequate number of viral copies(<138 copies/mL). A negative result must be combined with clinical observations, patient history, and epidemiological information. The expected result is Negative.  Fact Sheet for Patients:  EntrepreneurPulse.com.au  Fact Sheet for Healthcare Providers:  IncredibleEmployment.be  This test is no t yet approved or cleared by the [place] FDA and  has been authorized for detection and/or diagnosis of SARS-CoV-2 by FDA under an Emergency Use Authorization (EUA). This EUA will remain  in effect (meaning this test can be used) for the duration of the COVID-19 declaration under Section 564(b)(1) of the Act, 21 U.S.C.section 360bbb-3(b)(1), unless the authorization is terminated  or revoked sooner.       Influenza A by PCR NEGATIVE NEGATIVE Final   Influenza B by PCR NEGATIVE NEGATIVE Final    Comment: (NOTE) The Xpert Xpress SARS-CoV-2/FLU/RSV plus assay is intended as an aid in the diagnosis of influenza from Nasopharyngeal swab specimens and should not be used as a sole basis for treatment. Nasal washings and aspirates are unacceptable for Xpert Xpress SARS-CoV-2/FLU/RSV testing.  Fact Sheet for Patients: EntrepreneurPulse.com.au  Fact Sheet for Healthcare Providers: IncredibleEmployment.be  This test is not yet approved or  cleared by the [place] FDA and has been authorized for detection and/or diagnosis of SARS-CoV-2 by FDA under an Emergency Use Authorization (EUA). This EUA will remain in effect (meaning this test can be used) for the duration of the COVID-19 declaration under Section 564(b)(1) of the Act, 21 U.S.C. section 360bbb-3(b)(1), unless the authorization is terminated or revoked.  Performed at [facility], [address] [place], [place] [address]      Labs: BNP (last 3 results) Recent Labs    11/19/2020 1725 10/28/20 0532 10/29/2020 0223  BNP 593.5* 952.4* 1,751.0*   Basic Metabolic Panel: No results for input(s): NA, K, CL, CO2, GLUCOSE, BUN, CREATININE, CALCIUM, MG, PHOS in the last 168 hours. Liver Function Tests: No results for input(s): AST, ALT, ALKPHOS, BILITOT, PROT, ALBUMIN in the last 168 hours. No results for input(s): LIPASE, AMYLASE in the last 168 hours. No results for input(s): AMMONIA in the last 168 hours. CBC: No results for input(s): WBC, NEUTROABS, HGB, HCT, MCV, PLT in the last 168 hours. Cardiac Enzymes: No results for input(s): CKTOTAL, CKMB, CKMBINDEX, TROPONINI in the last 168 hours. BNP: Invalid input(s): POCBNP CBG: Recent Labs  Lab 11/15/20 0834 11/15/20 1126 11/15/20 1616 11/16/20 0854 11/16/20 1707  GLUCAP 217* 269* 274* 228* 198*   D-Dimer No results for input(s): DDIMER in the last 72 hours. Hgb A1c No results for input(s): HGBA1C in the last 72 hours. Lipid Profile No results for input(s): CHOL, HDL, LDLCALC, TRIG, CHOLHDL, LDLDIRECT in the last 72 hours. Thyroid function studies No results for input(s): TSH, T4TOTAL, T3FREE, THYROIDAB in the last 72 hours.  Invalid input(s): FREET3 Anemia work up No results for input(s): VITAMINB12, FOLATE, FERRITIN, TIBC, IRON, RETICCTPCT in the last 72 hours. Urinalysis    Component Value Date/Time   COLORURINE YELLOW (A) 07/19/2019 1934   APPEARANCEUR Cloudy (A) 04/03/2020 1314    LABSPEC 1.014 07/19/2019 1934   LABSPEC 1.014 11/08/2011 2010   PHURINE 6.0 07/19/2019 1934   GLUCOSEU Negative 04/03/2020 1314   GLUCOSEU  Negative 11/08/2011 2010   HGBUR NEGATIVE 07/19/2019 1934   BILIRUBINUR Negative 04/03/2020 1314   BILIRUBINUR Negative 11/08/2011 2010   KETONESUR 5 (A) 07/19/2019 1934   PROTEINUR 1+ (A) 04/03/2020 1314   PROTEINUR 100 (A) 07/19/2019 1934   NITRITE Negative 04/03/2020 1314   NITRITE NEGATIVE 07/19/2019 1934   LEUKOCYTESUR Trace (A) 04/03/2020 1314   LEUKOCYTESUR MODERATE (A) 07/19/2019 1934   LEUKOCYTESUR 3+ 11/08/2011 2010   Sepsis Labs Invalid input(s): PROCALCITONIN,  WBC,  LACTICIDVEN Microbiology Recent Results (from the past 240 hour(s))  Resp Panel by RT-PCR (Flu A&B, Covid) Nasopharyngeal Swab     Status: None   Collection Time: 11/13/20  2:58 PM   Specimen: Nasopharyngeal Swab; Nasopharyngeal(NP) swabs in vial transport medium  Result Value Ref Range Status   SARS Coronavirus 2 by RT PCR NEGATIVE NEGATIVE Final    Comment: (NOTE) SARS-CoV-2 target nucleic acids are NOT DETECTED.  The SARS-CoV-2 RNA is generally detectable in upper respiratory specimens during the acute phase of infection. The lowest concentration of SARS-CoV-2 viral copies this assay can detect is 138 copies/mL. A negative result does not preclude SARS-Cov-2 infection and should not be used as the sole basis for treatment or other patient management decisions. A negative result may occur with  improper specimen collection/handling, submission of specimen other than nasopharyngeal swab, presence of viral mutation(s) within the areas targeted by this assay, and inadequate number of viral copies(<138 copies/mL). A negative result must be combined with clinical observations, patient history, and epidemiological information. The expected result is Negative.  Fact Sheet for Patients:  EntrepreneurPulse.com.au  Fact Sheet for Healthcare  Providers:  IncredibleEmployment.be  This test is no t yet approved or cleared by the [place] FDA and  has been authorized for detection and/or diagnosis of SARS-CoV-2 by FDA under an Emergency Use Authorization (EUA). This EUA will remain  in effect (meaning this test can be used) for the duration of the COVID-19 declaration under Section 564(b)(1) of the Act, 21 U.S.C.section 360bbb-3(b)(1), unless the authorization is terminated  or revoked sooner.       Influenza A by PCR NEGATIVE NEGATIVE Final   Influenza B by PCR NEGATIVE NEGATIVE Final    Comment: (NOTE) The Xpert Xpress SARS-CoV-2/FLU/RSV plus assay is intended as an aid in the diagnosis of influenza from Nasopharyngeal swab specimens and should not be used as a sole basis for treatment. Nasal washings and aspirates are unacceptable for Xpert Xpress SARS-CoV-2/FLU/RSV testing.  Fact Sheet for Patients: EntrepreneurPulse.com.au  Fact Sheet for Healthcare Providers: IncredibleEmployment.be  This test is not yet approved or cleared by the [place] FDA and has been authorized for detection and/or diagnosis of SARS-CoV-2 by FDA under an Emergency Use Authorization (EUA). This EUA will remain in effect (meaning this test can be used) for the duration of the COVID-19 declaration under Section 564(b)(1) of the Act, 21 U.S.C. section 360bbb-3(b)(1), unless the authorization is terminated or revoked.  Performed at [facility], [address], [place], [place] [address]      Time coordinating discharge: 79mns  SIGNED:   JKathie Dike MD  [facility] 506/16/22 2:31 PM   If 7PM-7AM, please contact night-coverage [email or web address]

## 2020-11-22 NOTE — TOC Transition Note (Signed)
Transition of Care [facility]) - CM/SW Discharge Note   Patient Details  Name: Teresa Krueger MRN: [id number] Date of Birth: [birth year]  [facility] CM/SW Contact:  Alberteen Sam, LCSW Phone Number: 2020-12-14, 1:20 PM   Clinical Narrative:     Patient will DC to: hospice home Anticipated DC date: 2020-12-14 Family notified: Joneen Boers (son) Transport by: Johnanna Schneiders  Per MD patient ready for DC to hospice home . RN, patient, patient's family, and facility notified of DC. Discharge Summary sent to facility. RN given number for report [phone] . DC packet on chart. Ambulance transport requested for patient for 6:00 pm.  CSW signing off.  Pricilla Riffle, LCSW    Final next level of care: [place] Barriers to Discharge: No Barriers Identified   Patient Goals and CMS Choice Patient states their goals for this hospitalization and ongoing recovery are:: to go to hospice home CMS Medicare.gov Compare Post Acute Care list provided to:: Patient Choice offered to / list presented to : Patient  Discharge Placement              Patient chooses bed at:  (hospice home) Patient to be transferred to facility by: [facility] Name of family member notified: Joneen Boers Patient and family notified of of transfer: 2020/12/14  Discharge Plan and Services     Post Acute Care Choice: Hospice                    HH Arranged: PT,OT,RN,Nurse's Aide [place]: [facility] ([place]) Date HH Agency Contacted: 10/31/20 Time HH Agency Contacted: 1100 Representative spoke with at [place]: [place] ([place]) Interventions     Readmission Risk Interventions No flowsheet data found.

## 2020-11-22 NOTE — Progress Notes (Signed)
Pt  Deceased  verified with Britt Bolognese RN, Family notfied, MD notified, Hospice notfied, [facility] notified. Family to visit with pt at bed side. Personal items bagged up. IV removed

## 2020-11-22 NOTE — Progress Notes (Signed)
[facility] Liaison  There is a bed at [facility] today. Spoke with family to confirm that they are still interested.   Once consents are completed patient may be transported to [facility]. TOC manager is aware and has arranged transport for 6 pm today.   RN staff, please call report to [phone]. Bed is assigned when report is called.   Please leave IV intact. Please send signed and completed DNR with patient.   If family is not present when transport arrives, please call son Joneen Boers to update her that he is leaving.   Thank you,   B. Loren Racer, RN, BSN [facility] Liaison [phone]

## 2020-11-22 DEATH — deceased

## 2020-11-23 ENCOUNTER — Encounter: Payer: Medicare Other | Admitting: Occupational Therapy
